# Patient Record
Sex: Male | Born: 1945 | Race: White | Hispanic: No | Smoking: Former smoker
Health system: Southern US, Community
[De-identification: ages and names within clinical notes are randomized; demographics above are authoritative.]

## PROBLEM LIST (undated history)

## (undated) DIAGNOSIS — I2510 Atherosclerotic heart disease of native coronary artery without angina pectoris: Secondary | ICD-10-CM

## (undated) DIAGNOSIS — I639 Cerebral infarction, unspecified: Secondary | ICD-10-CM

## (undated) DIAGNOSIS — E785 Hyperlipidemia, unspecified: Secondary | ICD-10-CM

## (undated) DIAGNOSIS — Z7901 Long term (current) use of anticoagulants: Secondary | ICD-10-CM

## (undated) DIAGNOSIS — I480 Paroxysmal atrial fibrillation: Secondary | ICD-10-CM

## (undated) DIAGNOSIS — K219 Gastro-esophageal reflux disease without esophagitis: Secondary | ICD-10-CM

## (undated) DIAGNOSIS — Z9889 Other specified postprocedural states: Secondary | ICD-10-CM

## (undated) DIAGNOSIS — Z9189 Other specified personal risk factors, not elsewhere classified: Secondary | ICD-10-CM

## (undated) DIAGNOSIS — I10 Essential (primary) hypertension: Secondary | ICD-10-CM

## (undated) DIAGNOSIS — Z952 Presence of prosthetic heart valve: Secondary | ICD-10-CM

## (undated) DIAGNOSIS — E079 Disorder of thyroid, unspecified: Secondary | ICD-10-CM

## (undated) DIAGNOSIS — Z951 Presence of aortocoronary bypass graft: Secondary | ICD-10-CM

## (undated) DIAGNOSIS — H269 Unspecified cataract: Secondary | ICD-10-CM

## (undated) DIAGNOSIS — N189 Chronic kidney disease, unspecified: Secondary | ICD-10-CM

## (undated) HISTORY — DX: Hyperlipidemia, unspecified: E78.5

## (undated) HISTORY — DX: Unspecified cataract: H26.9

## (undated) HISTORY — DX: Gastro-esophageal reflux disease without esophagitis: K21.9

## (undated) HISTORY — DX: Presence of aortocoronary bypass graft: Z95.1

## (undated) HISTORY — DX: Chronic kidney disease, unspecified: N18.9

## (undated) HISTORY — DX: Atherosclerotic heart disease of native coronary artery without angina pectoris: I25.10

---

## 2001-08-15 DIAGNOSIS — I639 Cerebral infarction, unspecified: Secondary | ICD-10-CM

## 2001-08-15 HISTORY — DX: Cerebral infarction, unspecified: I63.9

## 2001-09-10 ENCOUNTER — Inpatient Hospital Stay (HOSPITAL_COMMUNITY): Admission: EM | Admit: 2001-09-10 | Discharge: 2001-09-13 | Payer: Self-pay | Admitting: Emergency Medicine

## 2001-09-10 ENCOUNTER — Encounter: Payer: Self-pay | Admitting: Neurology

## 2001-09-10 ENCOUNTER — Encounter: Payer: Self-pay | Admitting: Emergency Medicine

## 2001-09-13 ENCOUNTER — Inpatient Hospital Stay (HOSPITAL_COMMUNITY)
Admission: RE | Admit: 2001-09-13 | Discharge: 2001-10-09 | Payer: Self-pay | Admitting: Physical Medicine & Rehabilitation

## 2001-10-23 ENCOUNTER — Encounter (HOSPITAL_COMMUNITY): Admission: RE | Admit: 2001-10-23 | Discharge: 2001-11-22 | Payer: Self-pay | Admitting: Family Medicine

## 2001-11-23 ENCOUNTER — Encounter (HOSPITAL_COMMUNITY)
Admission: RE | Admit: 2001-11-23 | Discharge: 2001-12-23 | Payer: Self-pay | Admitting: Physical Medicine & Rehabilitation

## 2001-12-24 ENCOUNTER — Encounter (HOSPITAL_COMMUNITY)
Admission: RE | Admit: 2001-12-24 | Discharge: 2002-01-23 | Payer: Self-pay | Admitting: Physical Medicine & Rehabilitation

## 2002-01-25 ENCOUNTER — Encounter (HOSPITAL_COMMUNITY)
Admission: RE | Admit: 2002-01-25 | Discharge: 2002-02-24 | Payer: Self-pay | Admitting: Physical Medicine & Rehabilitation

## 2002-02-25 ENCOUNTER — Encounter (HOSPITAL_COMMUNITY)
Admission: RE | Admit: 2002-02-25 | Discharge: 2002-03-27 | Payer: Self-pay | Admitting: Physical Medicine & Rehabilitation

## 2002-03-28 ENCOUNTER — Other Ambulatory Visit: Admission: RE | Admit: 2002-03-28 | Discharge: 2002-03-28 | Payer: Self-pay | Admitting: General Surgery

## 2004-03-11 ENCOUNTER — Observation Stay (HOSPITAL_COMMUNITY): Admission: RE | Admit: 2004-03-11 | Discharge: 2004-03-12 | Payer: Self-pay | Admitting: Urology

## 2005-04-28 ENCOUNTER — Emergency Department (HOSPITAL_COMMUNITY): Admission: EM | Admit: 2005-04-28 | Discharge: 2005-04-28 | Payer: Self-pay | Admitting: Emergency Medicine

## 2005-08-15 HISTORY — PX: COLONOSCOPY: SHX174

## 2006-05-04 ENCOUNTER — Ambulatory Visit (HOSPITAL_COMMUNITY): Admission: RE | Admit: 2006-05-04 | Discharge: 2006-05-04 | Payer: Self-pay | Admitting: General Surgery

## 2008-08-15 DIAGNOSIS — Z951 Presence of aortocoronary bypass graft: Secondary | ICD-10-CM

## 2008-08-15 DIAGNOSIS — Z952 Presence of prosthetic heart valve: Secondary | ICD-10-CM

## 2008-08-15 HISTORY — DX: Presence of prosthetic heart valve: Z95.2

## 2008-08-15 HISTORY — DX: Presence of aortocoronary bypass graft: Z95.1

## 2008-08-20 ENCOUNTER — Ambulatory Visit (HOSPITAL_COMMUNITY): Admission: RE | Admit: 2008-08-20 | Discharge: 2008-08-20 | Payer: Self-pay | Admitting: Family Medicine

## 2009-01-05 ENCOUNTER — Ambulatory Visit (HOSPITAL_COMMUNITY): Admission: RE | Admit: 2009-01-05 | Discharge: 2009-01-05 | Payer: Self-pay | Admitting: Cardiovascular Disease

## 2009-01-07 ENCOUNTER — Ambulatory Visit (HOSPITAL_COMMUNITY): Admission: RE | Admit: 2009-01-07 | Discharge: 2009-01-07 | Payer: Self-pay | Admitting: Cardiovascular Disease

## 2009-01-26 HISTORY — PX: AORTIC VALVE REPLACEMENT: SHX41

## 2009-01-26 HISTORY — PX: CORONARY ARTERY BYPASS GRAFT: SHX141

## 2009-01-29 ENCOUNTER — Encounter: Payer: Self-pay | Admitting: Cardiovascular Disease

## 2009-01-29 ENCOUNTER — Ambulatory Visit (HOSPITAL_COMMUNITY): Admission: RE | Admit: 2009-01-29 | Discharge: 2009-01-29 | Payer: Self-pay | Admitting: Cardiovascular Disease

## 2009-02-09 ENCOUNTER — Inpatient Hospital Stay (HOSPITAL_COMMUNITY): Admission: EM | Admit: 2009-02-09 | Discharge: 2009-02-17 | Payer: Self-pay | Admitting: Emergency Medicine

## 2009-02-10 ENCOUNTER — Encounter: Payer: Self-pay | Admitting: Cardiothoracic Surgery

## 2009-02-10 ENCOUNTER — Ambulatory Visit: Payer: Self-pay | Admitting: Cardiothoracic Surgery

## 2009-02-11 ENCOUNTER — Encounter: Payer: Self-pay | Admitting: Thoracic Surgery (Cardiothoracic Vascular Surgery)

## 2009-02-11 ENCOUNTER — Encounter: Payer: Self-pay | Admitting: Cardiothoracic Surgery

## 2009-02-21 ENCOUNTER — Inpatient Hospital Stay (HOSPITAL_COMMUNITY): Admission: EM | Admit: 2009-02-21 | Discharge: 2009-02-26 | Payer: Self-pay | Admitting: Emergency Medicine

## 2009-03-04 ENCOUNTER — Encounter: Admission: RE | Admit: 2009-03-04 | Discharge: 2009-03-04 | Payer: Self-pay | Admitting: Cardiothoracic Surgery

## 2009-03-04 ENCOUNTER — Ambulatory Visit: Payer: Self-pay | Admitting: Cardiothoracic Surgery

## 2009-03-16 ENCOUNTER — Ambulatory Visit: Payer: Self-pay | Admitting: Cardiothoracic Surgery

## 2009-04-02 ENCOUNTER — Ambulatory Visit (HOSPITAL_COMMUNITY): Admission: RE | Admit: 2009-04-02 | Discharge: 2009-04-02 | Payer: Self-pay | Admitting: Family Medicine

## 2009-04-03 ENCOUNTER — Ambulatory Visit (HOSPITAL_COMMUNITY): Admission: RE | Admit: 2009-04-03 | Discharge: 2009-04-03 | Payer: Self-pay | Admitting: Family Medicine

## 2009-04-08 ENCOUNTER — Ambulatory Visit (HOSPITAL_COMMUNITY): Admission: RE | Admit: 2009-04-08 | Discharge: 2009-04-08 | Payer: Self-pay | Admitting: Family Medicine

## 2009-06-05 ENCOUNTER — Encounter (HOSPITAL_COMMUNITY): Admission: RE | Admit: 2009-06-05 | Discharge: 2009-08-14 | Payer: Self-pay | Admitting: Endocrinology

## 2009-06-15 ENCOUNTER — Ambulatory Visit (HOSPITAL_COMMUNITY): Admission: RE | Admit: 2009-06-15 | Discharge: 2009-06-15 | Payer: Self-pay | Admitting: Internal Medicine

## 2009-07-07 ENCOUNTER — Encounter: Admission: RE | Admit: 2009-07-07 | Discharge: 2009-07-07 | Payer: Self-pay | Admitting: Endocrinology

## 2009-07-07 ENCOUNTER — Other Ambulatory Visit: Admission: RE | Admit: 2009-07-07 | Discharge: 2009-07-07 | Payer: Self-pay | Admitting: Interventional Radiology

## 2009-08-16 ENCOUNTER — Emergency Department (HOSPITAL_COMMUNITY): Admission: EM | Admit: 2009-08-16 | Discharge: 2009-08-16 | Payer: Self-pay | Admitting: Emergency Medicine

## 2009-11-02 ENCOUNTER — Encounter: Payer: Self-pay | Admitting: Orthopedic Surgery

## 2009-11-05 ENCOUNTER — Ambulatory Visit: Payer: Self-pay | Admitting: Orthopedic Surgery

## 2009-11-05 DIAGNOSIS — IMO0002 Reserved for concepts with insufficient information to code with codable children: Secondary | ICD-10-CM | POA: Insufficient documentation

## 2009-11-05 DIAGNOSIS — M75100 Unspecified rotator cuff tear or rupture of unspecified shoulder, not specified as traumatic: Secondary | ICD-10-CM | POA: Insufficient documentation

## 2010-01-23 ENCOUNTER — Encounter (INDEPENDENT_AMBULATORY_CARE_PROVIDER_SITE_OTHER): Payer: Self-pay | Admitting: *Deleted

## 2010-02-17 ENCOUNTER — Encounter: Admission: RE | Admit: 2010-02-17 | Discharge: 2010-02-17 | Payer: Self-pay | Admitting: Endocrinology

## 2010-05-04 ENCOUNTER — Encounter (INDEPENDENT_AMBULATORY_CARE_PROVIDER_SITE_OTHER): Payer: Self-pay | Admitting: Cardiology

## 2010-05-04 ENCOUNTER — Ambulatory Visit (HOSPITAL_COMMUNITY): Admission: RE | Admit: 2010-05-04 | Discharge: 2010-05-04 | Payer: Self-pay | Admitting: Psychiatry

## 2010-09-16 NOTE — Letter (Signed)
Summary: History form  History form   Imported By: Jacklynn Ganong 11/09/2009 08:09:11  _____________________________________________________________________  External Attachment:    Type:   Image     Comment:   External Document

## 2010-09-16 NOTE — Letter (Signed)
Summary: *Orthopedic No Show Letter  [facility]  [address] [place] [address]  [place], [place] [address]   Phone: [phone]  Fax: [phone]      01/22/2010   [name] [address] [place], [place]  [address]     Dear [name],   Our records indicate that you missed your scheduled appointment with Dr. Beaulah Corin on 01/18/10.  Please contact this office to reschedule your appointment as soon as possible.  It is important that you keep your scheduled appointments with your physician, so we can provide you the best care possible.        Sincerely,    Dr. Terrance Mass, MD [facility] Phone [phone]

## 2010-09-16 NOTE — Assessment & Plan Note (Signed)
Summary: LEFT SHOULDER PAIN NEEDS XR/SEC HOR/CRESENZO/BSF   Vital Signs:  Patient profile:   65 year old male Height:      70 inches Weight:      228 pounds Pulse rate:   76 / minute Resp:     18 per minute  Vitals Entered By: Fuller Canada MD (November 05, 2009 9:40 AM)  Visit Type:  initial visit Referring Provider:  Dr. Nobie Putnam Primary Provider:  [facility]  CC:  left shoulder pain.  History of Present Illness: 65 year old male had a stroke in 2003 it affected his RIGHT side his LEFT side is normal he started having atraumatic onset of pain a month ago it got worse and worse.  It is an 8/10 on really by Vicodin.  His sharp dull pain worse when he moves his shoulder associated with catching.  Is worse when he tries to lift his arm over his head.  Xrays today in our office.  Meds: Gabapentin, Pantoprazole, Simvastatin, Stool softener, Vicodin 5, Atlantis, Carvedilil, Glipizide, Xanax, Pennsaid, Torsemide, Lisinopril, ASA, Insulin.     Allergies (verified): No Known Drug Allergies  Past History:  Past Medical History: hx of stroke htn reflux cholesterol diabetes anxiety depression  Past Surgical History: open heart 3 bypass  Family History: FH of Cancer:  Family History of Diabetes Family History Coronary Heart Disease male < 58 Family History of Arthritis Hx, family, chronic respiratory condition  Social History: Patient is single.  disabled no smoking no alcohol alot of caffeine use daily  Review of Systems Constitutional:  Complains of fatigue; denies weight loss, weight gain, fever, and chills. Cardiovascular:  Complains of chest pain, palpitations, and murmurs; denies fainting. Respiratory:  Complains of short of breath, wheezing, couch, and snoring; denies tightness, pain on inspiration, and snoring . Gastrointestinal:  Complains of heartburn; denies nausea, vomiting, diarrhea, constipation, and blood in your stools. Genitourinary:   Complains of frequency; denies urgency, difficulty urinating, painful urination, flank pain, and bleeding in urine. Neurologic:  Complains of tingling and dizziness; denies numbness, unsteady gait, tremors, and seizure. Musculoskeletal:  Complains of joint pain, swelling, stiffness, and muscle pain; denies instability, redness, and heat. Endocrine:  Denies excessive thirst, exessive urination, and heat or cold intolerance. Psychiatric:  Complains of nervousness, depression, anxiety, and hallucinations. Skin:  Complains of poor healing; denies changes in the skin, rash, itching, and redness. HEENT:  Complains of blurred or double vision and watering; denies eye pain and redness; headache, difficult swallowing, ears ring. Immunology:  Denies seasonal allergies, sinus problems, and allergic to bee stings. Hemoatologic:  Complains of easy bleeding and brusing.  Physical Exam  Additional Exam:  GEN: well developed, well nourished, normal grooming and hygiene, no deformity and body habitus tall medium build  CDV: pulses are normal, no edema, no erythema. no tenderness  Lymph: normal lymph nodes   Skin: no rashes, skin lesions or open sores   NEURO: normal sensation LEFT upper extremity  Psyche: awake, alert and oriented. Mood normal   Gait: favors RIGHT side  No deformity noted LEFT shoulder tenderness anterolateral deltoid restricted range of motion with 50 external rotation, forward elevation only 90, abduction 90, weakness in the supraspinatus.  Shoulder is stable.      Impression & Recommendations:  Problem # 1:  SUBACROMIAL BURSITIS, LEFT (ICD-726.19) Assessment New  x-rays LEFT shoulder  Minimal joint is normal, acromion is curved.  Otherwise normal  Impression normal LEFT shoulder  Orders: New Patient Level III ([address]) Joint Aspirate / Injection,  Large (20610) Depo- Medrol 40mg  (J1030) Shoulder x-ray,  minimum 2 views ([address])  Patient Instructions: 1)  You have  received an injection of cortisone today. You may experience increased pain at the injection site. Apply ice pack to the area for 20 minutes every 2 hours and take 2 xtra strength tylenol every 8 hours. This increased pain will usually resolve in 24 hours. The injection will take effect in 3-10 days.  2)  come back in 4 weeks

## 2010-09-16 NOTE — Letter (Signed)
Summary: Pericardial tissue heart valve implant card  Pericardial tissue heart valve implant card   Imported By: Jacklynn Ganong 11/13/2009 08:22:49  _____________________________________________________________________  External Attachment:    Type:   Image     Comment:   External Document

## 2010-09-23 IMAGING — CR DG CHEST 2V
2 series · 2 of 2 positions shown · non-contrast
Comparison: Read healed diagnostic chest x-ray 01/05/2009 and Deeqa Rayaan[MEHUL]portable chest x-ray 02/09/2009.

CLINICAL DATA: Preop for CABG, unstable angina, hypertension,
diabetes, former smoker.

CHEST - 2 VIEW

[w chest pa]
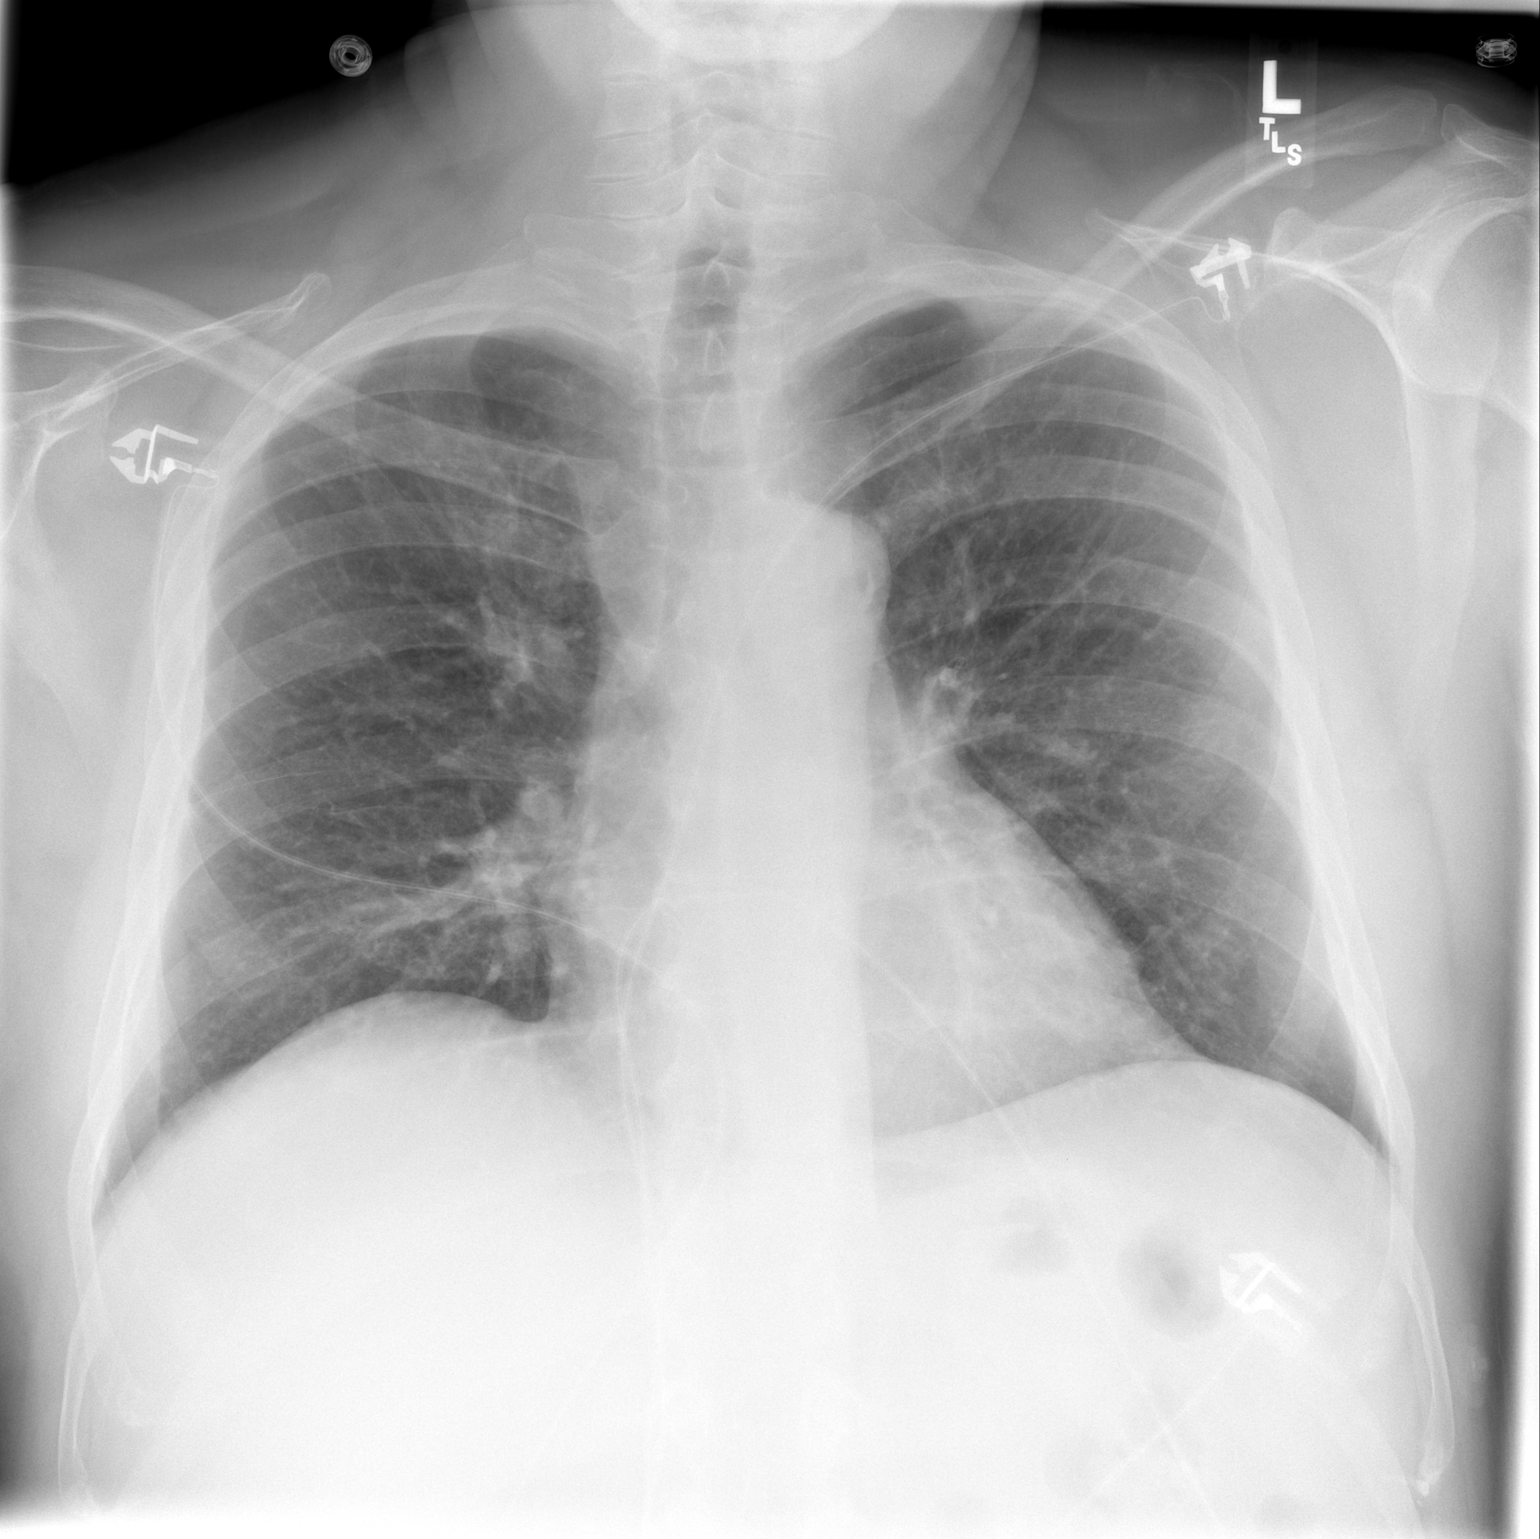

[w chest lat *]
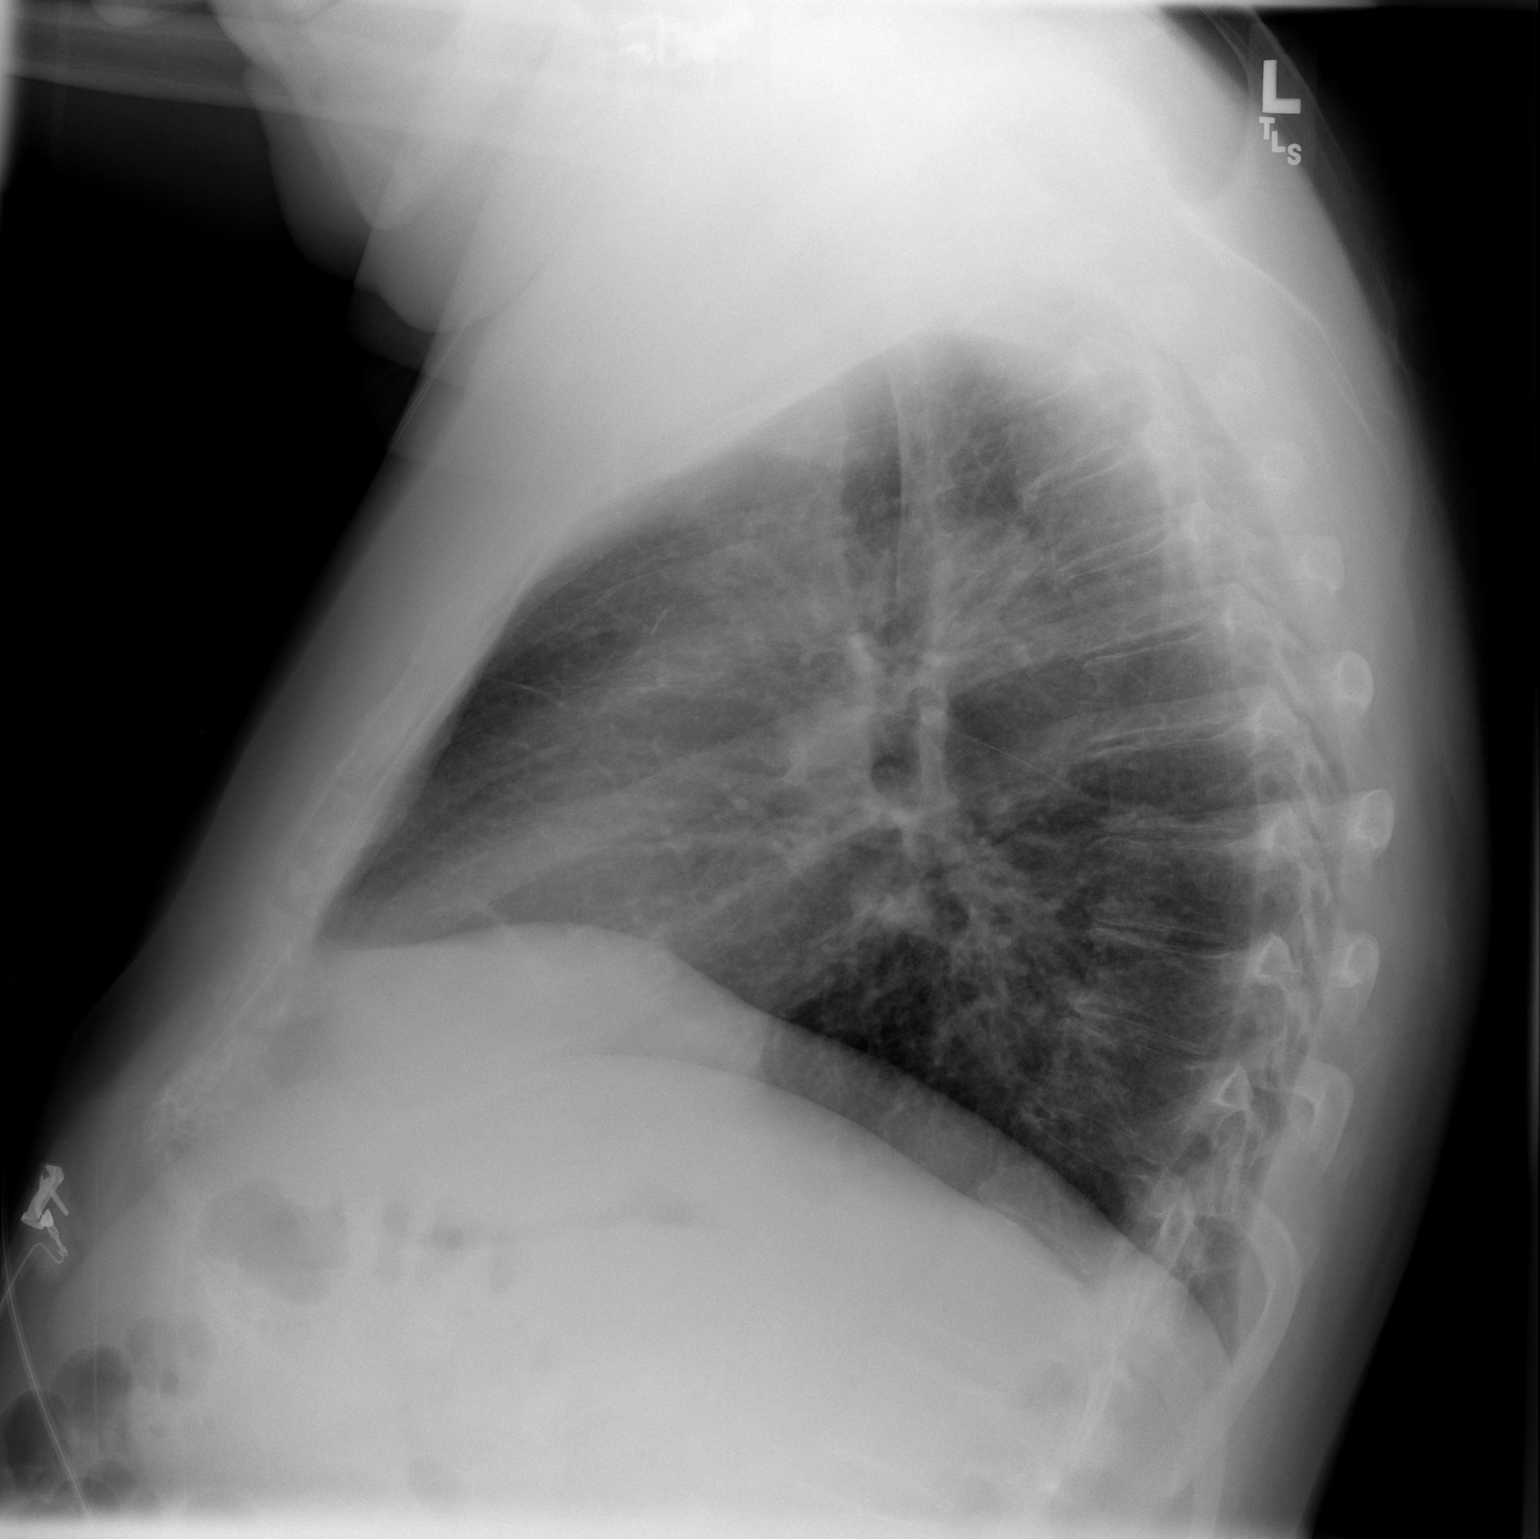

[2 of 2 positions shown; findings below may reference images not displayed]

FINDINGS: Submaximal inspiration is seen with clear lungs.
Pulmonary vascularity currently appears normal.  Heart size and
configuration are normal.  Thoracic aortic arch is slightly
calcified.  Mediastinum, hila, pleura and osseous structures appear
normal for age.
IMPRESSION: 1.  Submaximal inspiration.
2.  No active cardiopulmonary disease.

## 2010-10-31 LAB — DIFFERENTIAL
Eosinophils Absolute: 0.1 10*3/uL (ref 0.0–0.7)
Eosinophils Relative: 1 % (ref 0–5)
Lymphocytes Relative: 16 % (ref 12–46)
Lymphs Abs: 1.9 10*3/uL (ref 0.7–4.0)
Monocytes Absolute: 0.9 10*3/uL (ref 0.1–1.0)
Monocytes Relative: 8 % (ref 3–12)

## 2010-10-31 LAB — CBC
HCT: 43.8 % (ref 39.0–52.0)
Hemoglobin: 15 g/dL (ref 13.0–17.0)
MCV: 82.4 fL (ref 78.0–100.0)
WBC: 11.3 10*3/uL — ABNORMAL HIGH (ref 4.0–10.5)

## 2010-10-31 LAB — BASIC METABOLIC PANEL
BUN: 27 mg/dL — ABNORMAL HIGH (ref 6–23)
Chloride: 97 mEq/L (ref 96–112)
GFR calc non Af Amer: 54 mL/min — ABNORMAL LOW (ref >60)
Glucose, Bld: 357 mg/dL — ABNORMAL HIGH (ref 70–99)
Potassium: 4.5 mEq/L (ref 3.5–5.1)
Sodium: 134 mEq/L — ABNORMAL LOW (ref 135–145)

## 2010-10-31 LAB — POCT CARDIAC MARKERS
CKMB, poc: 1.6 ng/mL (ref 1.0–8.0)
Troponin i, poc: <0.05 ng/mL (ref 0.00–0.09)

## 2010-10-31 LAB — GLUCOSE, CAPILLARY: Glucose-Capillary: 310 mg/dL — ABNORMAL HIGH (ref 70–99)

## 2010-11-21 LAB — CBC
HCT: 23.3 % — ABNORMAL LOW (ref 39.0–52.0)
HCT: 24 % — ABNORMAL LOW (ref 39.0–52.0)
HCT: 25.2 % — ABNORMAL LOW (ref 39.0–52.0)
HCT: 25.9 % — ABNORMAL LOW (ref 39.0–52.0)
HCT: 26.5 % — ABNORMAL LOW (ref 39.0–52.0)
HCT: 26.5 % — ABNORMAL LOW (ref 39.0–52.0)
HCT: 29.6 % — ABNORMAL LOW (ref 39.0–52.0)
HCT: 30.6 % — ABNORMAL LOW (ref 39.0–52.0)
HCT: 31.7 % — ABNORMAL LOW (ref 39.0–52.0)
Hemoglobin: 10.7 g/dL — ABNORMAL LOW (ref 13.0–17.0)
Hemoglobin: 7.8 g/dL — CL (ref 13.0–17.0)
Hemoglobin: 7.9 g/dL — CL (ref 13.0–17.0)
Hemoglobin: 8.4 g/dL — ABNORMAL LOW (ref 13.0–17.0)
Hemoglobin: 8.4 g/dL — ABNORMAL LOW (ref 13.0–17.0)
Hemoglobin: 8.8 g/dL — ABNORMAL LOW (ref 13.0–17.0)
Hemoglobin: 8.9 g/dL — ABNORMAL LOW (ref 13.0–17.0)
Hemoglobin: 8.9 g/dL — ABNORMAL LOW (ref 13.0–17.0)
Hemoglobin: 9 g/dL — ABNORMAL LOW (ref 13.0–17.0)
Hemoglobin: 9.5 g/dL — ABNORMAL LOW (ref 13.0–17.0)
Hemoglobin: 9.8 g/dL — ABNORMAL LOW (ref 13.0–17.0)
MCHC: 32.4 g/dL (ref 30.0–36.0)
MCHC: 32.9 g/dL (ref 30.0–36.0)
MCHC: 33.2 g/dL (ref 30.0–36.0)
MCHC: 33.3 g/dL (ref 30.0–36.0)
MCHC: 33.3 g/dL (ref 30.0–36.0)
MCHC: 33.7 g/dL (ref 30.0–36.0)
MCHC: 33.8 g/dL (ref 30.0–36.0)
MCHC: 33.9 g/dL (ref 30.0–36.0)
MCV: 76.1 fL — ABNORMAL LOW (ref 78.0–100.0)
MCV: 76.2 fL — ABNORMAL LOW (ref 78.0–100.0)
MCV: 78 fL (ref 78.0–100.0)
MCV: 78.8 fL (ref 78.0–100.0)
MCV: 78.9 fL (ref 78.0–100.0)
MCV: 79.6 fL (ref 78.0–100.0)
Platelets: 112 10*3/uL — ABNORMAL LOW (ref 150–400)
Platelets: 116 10*3/uL — ABNORMAL LOW (ref 150–400)
Platelets: 134 10*3/uL — ABNORMAL LOW (ref 150–400)
Platelets: 150 10*3/uL (ref 150–400)
Platelets: 179 10*3/uL (ref 150–400)
Platelets: 187 10*3/uL (ref 150–400)
Platelets: 272 10*3/uL (ref 150–400)
Platelets: 354 10*3/uL (ref 150–400)
RBC: 2.99 MIL/uL — ABNORMAL LOW (ref 4.22–5.81)
RBC: 3.15 MIL/uL — ABNORMAL LOW (ref 4.22–5.81)
RBC: 3.19 MIL/uL — ABNORMAL LOW (ref 4.22–5.81)
RBC: 3.32 MIL/uL — ABNORMAL LOW (ref 4.22–5.81)
RBC: 3.33 MIL/uL — ABNORMAL LOW (ref 4.22–5.81)
RBC: 3.36 MIL/uL — ABNORMAL LOW (ref 4.22–5.81)
RBC: 3.39 MIL/uL — ABNORMAL LOW (ref 4.22–5.81)
RBC: 3.4 MIL/uL — ABNORMAL LOW (ref 4.22–5.81)
RBC: 3.6 MIL/uL — ABNORMAL LOW (ref 4.22–5.81)
RBC: 3.73 MIL/uL — ABNORMAL LOW (ref 4.22–5.81)
RBC: 3.82 MIL/uL — ABNORMAL LOW (ref 4.22–5.81)
RBC: 4.03 MIL/uL — ABNORMAL LOW (ref 4.22–5.81)
RDW: 18.4 % — ABNORMAL HIGH (ref 11.5–15.5)
RDW: 18.5 % — ABNORMAL HIGH (ref 11.5–15.5)
RDW: 18.9 % — ABNORMAL HIGH (ref 11.5–15.5)
RDW: 19 % — ABNORMAL HIGH (ref 11.5–15.5)
RDW: 19.7 % — ABNORMAL HIGH (ref 11.5–15.5)
RDW: 20 % — ABNORMAL HIGH (ref 11.5–15.5)
RDW: 20.6 % — ABNORMAL HIGH (ref 11.5–15.5)
RDW: 20.8 % — ABNORMAL HIGH (ref 11.5–15.5)
RDW: 21.4 % — ABNORMAL HIGH (ref 11.5–15.5)
WBC: 10.4 10*3/uL (ref 4.0–10.5)
WBC: 10.7 10*3/uL — ABNORMAL HIGH (ref 4.0–10.5)
WBC: 11.5 10*3/uL — ABNORMAL HIGH (ref 4.0–10.5)
WBC: 11.9 10*3/uL — ABNORMAL HIGH (ref 4.0–10.5)
WBC: 11.9 10*3/uL — ABNORMAL HIGH (ref 4.0–10.5)
WBC: 12 10*3/uL — ABNORMAL HIGH (ref 4.0–10.5)
WBC: 13.9 10*3/uL — ABNORMAL HIGH (ref 4.0–10.5)
WBC: 15.9 10*3/uL — ABNORMAL HIGH (ref 4.0–10.5)
WBC: 8.5 10*3/uL (ref 4.0–10.5)
WBC: 9.1 10*3/uL (ref 4.0–10.5)
WBC: 9.6 10*3/uL (ref 4.0–10.5)

## 2010-11-21 LAB — BASIC METABOLIC PANEL
BUN: 14 mg/dL (ref 6–23)
BUN: 16 mg/dL (ref 6–23)
BUN: 25 mg/dL — ABNORMAL HIGH (ref 6–23)
BUN: 29 mg/dL — ABNORMAL HIGH (ref 6–23)
BUN: 32 mg/dL — ABNORMAL HIGH (ref 6–23)
BUN: 9 mg/dL (ref 6–23)
CO2: 24 mEq/L (ref 19–32)
CO2: 25 mEq/L (ref 19–32)
CO2: 27 mEq/L (ref 19–32)
CO2: 28 mEq/L (ref 19–32)
CO2: 28 mEq/L (ref 19–32)
CO2: 30 mEq/L (ref 19–32)
Calcium: 8.1 mg/dL — ABNORMAL LOW (ref 8.4–10.5)
Calcium: 8.2 mg/dL — ABNORMAL LOW (ref 8.4–10.5)
Calcium: 8.2 mg/dL — ABNORMAL LOW (ref 8.4–10.5)
Calcium: 8.5 mg/dL (ref 8.4–10.5)
Calcium: 8.6 mg/dL (ref 8.4–10.5)
Calcium: 8.7 mg/dL (ref 8.4–10.5)
Calcium: 9.4 mg/dL (ref 8.4–10.5)
Chloride: 100 mEq/L (ref 96–112)
Chloride: 101 mEq/L (ref 96–112)
Chloride: 103 mEq/L (ref 96–112)
Chloride: 106 mEq/L (ref 96–112)
Chloride: 98 mEq/L (ref 96–112)
Creatinine, Ser: 1.05 mg/dL (ref 0.4–1.5)
Creatinine, Ser: 1.09 mg/dL (ref 0.4–1.5)
Creatinine, Ser: 1.18 mg/dL (ref 0.4–1.5)
Creatinine, Ser: 1.24 mg/dL (ref 0.4–1.5)
Creatinine, Ser: 1.28 mg/dL (ref 0.4–1.5)
GFR calc Af Amer: >60 mL/min (ref >60)
GFR calc Af Amer: >60 mL/min (ref >60)
GFR calc Af Amer: >60 mL/min (ref >60)
GFR calc Af Amer: >60 mL/min (ref >60)
GFR calc Af Amer: >60 mL/min (ref >60)
GFR calc Af Amer: >60 mL/min (ref >60)
GFR calc Af Amer: >60 mL/min (ref >60)
GFR calc non Af Amer: 57 mL/min — ABNORMAL LOW (ref >60)
GFR calc non Af Amer: 59 mL/min — ABNORMAL LOW (ref >60)
GFR calc non Af Amer: >60 mL/min (ref >60)
GFR calc non Af Amer: >60 mL/min (ref >60)
GFR calc non Af Amer: >60 mL/min (ref >60)
GFR calc non Af Amer: >60 mL/min (ref >60)
GFR calc non Af Amer: >60 mL/min (ref >60)
Glucose, Bld: 118 mg/dL — ABNORMAL HIGH (ref 70–99)
Glucose, Bld: 171 mg/dL — ABNORMAL HIGH (ref 70–99)
Glucose, Bld: 187 mg/dL — ABNORMAL HIGH (ref 70–99)
Glucose, Bld: 242 mg/dL — ABNORMAL HIGH (ref 70–99)
Glucose, Bld: 246 mg/dL — ABNORMAL HIGH (ref 70–99)
Potassium: 4 mEq/L (ref 3.5–5.1)
Potassium: 4 mEq/L (ref 3.5–5.1)
Potassium: 4.2 mEq/L (ref 3.5–5.1)
Potassium: 4.4 mEq/L (ref 3.5–5.1)
Potassium: 4.5 mEq/L (ref 3.5–5.1)
Potassium: 4.7 mEq/L (ref 3.5–5.1)
Potassium: 4.8 mEq/L (ref 3.5–5.1)
Sodium: 132 mEq/L — ABNORMAL LOW (ref 135–145)
Sodium: 134 mEq/L — ABNORMAL LOW (ref 135–145)
Sodium: 135 mEq/L (ref 135–145)
Sodium: 136 mEq/L (ref 135–145)
Sodium: 136 mEq/L (ref 135–145)
Sodium: 138 mEq/L (ref 135–145)

## 2010-11-21 LAB — POCT I-STAT 3, ART BLOOD GAS (G3+)
Bicarbonate: 23.4 mEq/L (ref 20.0–24.0)
Bicarbonate: 25.2 mEq/L — ABNORMAL HIGH (ref 20.0–24.0)
Patient temperature: 38.5
TCO2: 25 mmol/L (ref 0–100)
TCO2: 27 mmol/L (ref 0–100)
pCO2 arterial: 47.7 mmHg — ABNORMAL HIGH (ref 35.0–45.0)
pCO2 arterial: 48.9 mmHg — ABNORMAL HIGH (ref 35.0–45.0)
pH, Arterial: 7.294 — ABNORMAL LOW (ref 7.350–7.450)
pH, Arterial: 7.336 — ABNORMAL LOW (ref 7.350–7.450)

## 2010-11-21 LAB — CROSSMATCH
ABO/RH(D): O POS
ABO/RH(D): O POS
Antibody Screen: NEGATIVE

## 2010-11-21 LAB — GLUCOSE, CAPILLARY
Glucose-Capillary: 111 mg/dL — ABNORMAL HIGH (ref 70–99)
Glucose-Capillary: 120 mg/dL — ABNORMAL HIGH (ref 70–99)
Glucose-Capillary: 122 mg/dL — ABNORMAL HIGH (ref 70–99)
Glucose-Capillary: 128 mg/dL — ABNORMAL HIGH (ref 70–99)
Glucose-Capillary: 137 mg/dL — ABNORMAL HIGH (ref 70–99)
Glucose-Capillary: 141 mg/dL — ABNORMAL HIGH (ref 70–99)
Glucose-Capillary: 141 mg/dL — ABNORMAL HIGH (ref 70–99)
Glucose-Capillary: 141 mg/dL — ABNORMAL HIGH (ref 70–99)
Glucose-Capillary: 164 mg/dL — ABNORMAL HIGH (ref 70–99)
Glucose-Capillary: 180 mg/dL — ABNORMAL HIGH (ref 70–99)
Glucose-Capillary: 188 mg/dL — ABNORMAL HIGH (ref 70–99)
Glucose-Capillary: 198 mg/dL — ABNORMAL HIGH (ref 70–99)
Glucose-Capillary: 205 mg/dL — ABNORMAL HIGH (ref 70–99)
Glucose-Capillary: 209 mg/dL — ABNORMAL HIGH (ref 70–99)
Glucose-Capillary: 219 mg/dL — ABNORMAL HIGH (ref 70–99)
Glucose-Capillary: 223 mg/dL — ABNORMAL HIGH (ref 70–99)
Glucose-Capillary: 226 mg/dL — ABNORMAL HIGH (ref 70–99)
Glucose-Capillary: 229 mg/dL — ABNORMAL HIGH (ref 70–99)
Glucose-Capillary: 239 mg/dL — ABNORMAL HIGH (ref 70–99)
Glucose-Capillary: 243 mg/dL — ABNORMAL HIGH (ref 70–99)
Glucose-Capillary: 248 mg/dL — ABNORMAL HIGH (ref 70–99)
Glucose-Capillary: 259 mg/dL — ABNORMAL HIGH (ref 70–99)
Glucose-Capillary: 273 mg/dL — ABNORMAL HIGH (ref 70–99)
Glucose-Capillary: 285 mg/dL — ABNORMAL HIGH (ref 70–99)
Glucose-Capillary: 287 mg/dL — ABNORMAL HIGH (ref 70–99)
Glucose-Capillary: 304 mg/dL — ABNORMAL HIGH (ref 70–99)
Glucose-Capillary: 345 mg/dL — ABNORMAL HIGH (ref 70–99)

## 2010-11-21 LAB — CREATININE, SERUM
Creatinine, Ser: 1.06 mg/dL (ref 0.4–1.5)
Creatinine, Ser: 1.33 mg/dL (ref 0.4–1.5)
GFR calc Af Amer: >60 mL/min (ref >60)
GFR calc Af Amer: >60 mL/min (ref >60)
GFR calc non Af Amer: 55 mL/min — ABNORMAL LOW (ref >60)
GFR calc non Af Amer: >60 mL/min (ref >60)

## 2010-11-21 LAB — DIFFERENTIAL
Basophils Absolute: 0 10*3/uL (ref 0.0–0.1)
Basophils Absolute: 0.1 10*3/uL (ref 0.0–0.1)
Basophils Relative: 0 % (ref 0–1)
Basophils Relative: 1 % (ref 0–1)
Basophils Relative: 1 % (ref 0–1)
Basophils Relative: 1 % (ref 0–1)
Eosinophils Absolute: 0.2 10*3/uL (ref 0.0–0.7)
Eosinophils Absolute: 0.3 10*3/uL (ref 0.0–0.7)
Eosinophils Relative: 2 % (ref 0–5)
Eosinophils Relative: 3 % (ref 0–5)
Lymphocytes Relative: 13 % (ref 12–46)
Lymphocytes Relative: 15 % (ref 12–46)
Lymphocytes Relative: 17 % (ref 12–46)
Lymphs Abs: 1.4 10*3/uL (ref 0.7–4.0)
Lymphs Abs: 1.5 10*3/uL (ref 0.7–4.0)
Monocytes Absolute: 0.9 10*3/uL (ref 0.1–1.0)
Monocytes Absolute: 1 10*3/uL (ref 0.1–1.0)
Monocytes Absolute: 1.4 10*3/uL — ABNORMAL HIGH (ref 0.1–1.0)
Monocytes Relative: 10 % (ref 3–12)
Monocytes Relative: 10 % (ref 3–12)
Monocytes Relative: 11 % (ref 3–12)
Monocytes Relative: 12 % (ref 3–12)
Monocytes Relative: 12 % (ref 3–12)
Neutro Abs: 6.3 10*3/uL (ref 1.7–7.7)
Neutro Abs: 6.9 10*3/uL (ref 1.7–7.7)
Neutro Abs: 7 10*3/uL (ref 1.7–7.7)
Neutro Abs: 9.2 10*3/uL — ABNORMAL HIGH (ref 1.7–7.7)
Neutrophils Relative %: 69 % (ref 43–77)
Neutrophils Relative %: 70 % (ref 43–77)
Neutrophils Relative %: 71 % (ref 43–77)

## 2010-11-21 LAB — PROTIME-INR: INR: 1.2 (ref 0.00–1.49)

## 2010-11-21 LAB — POCT I-STAT, CHEM 8
BUN: 10 mg/dL (ref 6–23)
BUN: 14 mg/dL (ref 6–23)
Calcium, Ion: 1.18 mmol/L (ref 1.12–1.32)
Calcium, Ion: 1.21 mmol/L (ref 1.12–1.32)
Creatinine, Ser: 1.1 mg/dL (ref 0.4–1.5)
Glucose, Bld: 127 mg/dL — ABNORMAL HIGH (ref 70–99)
Hemoglobin: 8.5 g/dL — ABNORMAL LOW (ref 13.0–17.0)
TCO2: 23 mmol/L (ref 0–100)
TCO2: 23 mmol/L (ref 0–100)

## 2010-11-21 LAB — IRON AND TIBC
Iron: 12 ug/dL — ABNORMAL LOW (ref 42–135)
TIBC: 304 ug/dL (ref 215–435)

## 2010-11-21 LAB — VANCOMYCIN, TROUGH: Vancomycin Tr: 13.5 ug/mL (ref 10.0–20.0)

## 2010-11-21 LAB — COMPREHENSIVE METABOLIC PANEL
ALT: 28 U/L (ref 0–53)
ALT: 33 U/L (ref 0–53)
Alkaline Phosphatase: 73 U/L (ref 39–117)
Alkaline Phosphatase: 79 U/L (ref 39–117)
CO2: 31 mEq/L (ref 19–32)
CO2: 32 mEq/L (ref 19–32)
Chloride: 101 mEq/L (ref 96–112)
Chloride: 103 mEq/L (ref 96–112)
GFR calc non Af Amer: >60 mL/min (ref >60)
Glucose, Bld: 113 mg/dL — ABNORMAL HIGH (ref 70–99)
Glucose, Bld: 121 mg/dL — ABNORMAL HIGH (ref 70–99)
Potassium: 4.1 mEq/L (ref 3.5–5.1)
Potassium: 4.3 mEq/L (ref 3.5–5.1)
Sodium: 138 mEq/L (ref 135–145)
Sodium: 138 mEq/L (ref 135–145)
Total Protein: 6.6 g/dL (ref 6.0–8.3)

## 2010-11-21 LAB — MAGNESIUM
Magnesium: 2.4 mg/dL (ref 1.5–2.5)
Magnesium: 2.6 mg/dL — ABNORMAL HIGH (ref 1.5–2.5)
Magnesium: 2.8 mg/dL — ABNORMAL HIGH (ref 1.5–2.5)

## 2010-11-21 LAB — BRAIN NATRIURETIC PEPTIDE
Pro B Natriuretic peptide (BNP): 172 pg/mL — ABNORMAL HIGH (ref 0.0–100.0)
Pro B Natriuretic peptide (BNP): 246 pg/mL — ABNORMAL HIGH (ref 0.0–100.0)
Pro B Natriuretic peptide (BNP): 335 pg/mL — ABNORMAL HIGH (ref 0.0–100.0)

## 2010-11-21 LAB — FOLATE: Folate: 16.1 ng/mL

## 2010-11-21 LAB — CULTURE, BLOOD (ROUTINE X 2)
Culture: NO GROWTH
Culture: NO GROWTH
Report Status: 7152010
Report Status: 7162010

## 2010-11-21 LAB — POTASSIUM: Potassium: 5.3 mEq/L — ABNORMAL HIGH (ref 3.5–5.1)

## 2010-11-21 LAB — RETICULOCYTES: Retic Count, Absolute: 80.7 10*3/uL (ref 19.0–186.0)

## 2010-11-21 LAB — VITAMIN B12: Vitamin B-12: 546 pg/mL (ref 211–911)

## 2010-11-22 LAB — COMPREHENSIVE METABOLIC PANEL
Albumin: 3.7 g/dL (ref 3.5–5.2)
Alkaline Phosphatase: 78 U/L (ref 39–117)
BUN: 19 mg/dL (ref 6–23)
CO2: 26 mEq/L (ref 19–32)
Chloride: 98 mEq/L (ref 96–112)
Creatinine, Ser: 1.45 mg/dL (ref 0.4–1.5)
GFR calc non Af Amer: 50 mL/min — ABNORMAL LOW (ref >60)
Glucose, Bld: 363 mg/dL — ABNORMAL HIGH (ref 70–99)
Potassium: 4.8 mEq/L (ref 3.5–5.1)
Total Bilirubin: 0.3 mg/dL (ref 0.3–1.2)

## 2010-11-22 LAB — BASIC METABOLIC PANEL
CO2: 26 mEq/L (ref 19–32)
Calcium: 9.5 mg/dL (ref 8.4–10.5)
GFR calc Af Amer: >60 mL/min (ref >60)
GFR calc non Af Amer: >60 mL/min (ref >60)
Sodium: 137 mEq/L (ref 135–145)

## 2010-11-22 LAB — CROSSMATCH
ABO/RH(D): O POS
Antibody Screen: NEGATIVE

## 2010-11-22 LAB — POCT I-STAT 3, ART BLOOD GAS (G3+)
Bicarbonate: 23.2 mEq/L (ref 20.0–24.0)
TCO2: 24 mmol/L (ref 0–100)
pCO2 arterial: 49.1 mmHg — ABNORMAL HIGH (ref 35.0–45.0)
pH, Arterial: 7.337 — ABNORMAL LOW (ref 7.350–7.450)
pH, Arterial: 7.358 (ref 7.350–7.450)
pH, Arterial: 7.364 (ref 7.350–7.450)
pO2, Arterial: 133 mmHg — ABNORMAL HIGH (ref 80.0–100.0)
pO2, Arterial: 202 mmHg — ABNORMAL HIGH (ref 80.0–100.0)

## 2010-11-22 LAB — GLUCOSE, CAPILLARY
Glucose-Capillary: 109 mg/dL — ABNORMAL HIGH (ref 70–99)
Glucose-Capillary: 117 mg/dL — ABNORMAL HIGH (ref 70–99)
Glucose-Capillary: 121 mg/dL — ABNORMAL HIGH (ref 70–99)
Glucose-Capillary: 124 mg/dL — ABNORMAL HIGH (ref 70–99)
Glucose-Capillary: 312 mg/dL — ABNORMAL HIGH (ref 70–99)

## 2010-11-22 LAB — URINALYSIS, ROUTINE W REFLEX MICROSCOPIC
Bilirubin Urine: NEGATIVE
Glucose, UA: >1000 mg/dL — AB
Hgb urine dipstick: NEGATIVE
Ketones, ur: NEGATIVE mg/dL
Leukocytes, UA: NEGATIVE
Nitrite: NEGATIVE
Protein, ur: NEGATIVE mg/dL
Specific Gravity, Urine: 1.009 (ref 1.005–1.030)
Urobilinogen, UA: 0.2 mg/dL (ref 0.0–1.0)
pH: 7 (ref 5.0–8.0)

## 2010-11-22 LAB — POCT I-STAT 4, (NA,K, GLUC, HGB,HCT)
Glucose, Bld: 154 mg/dL — ABNORMAL HIGH (ref 70–99)
Glucose, Bld: 196 mg/dL — ABNORMAL HIGH (ref 70–99)
Glucose, Bld: 197 mg/dL — ABNORMAL HIGH (ref 70–99)
Glucose, Bld: 241 mg/dL — ABNORMAL HIGH (ref 70–99)
Glucose, Bld: 272 mg/dL — ABNORMAL HIGH (ref 70–99)
Glucose, Bld: 284 mg/dL — ABNORMAL HIGH (ref 70–99)
HCT: 21 % — ABNORMAL LOW (ref 39.0–52.0)
HCT: 22 % — ABNORMAL LOW (ref 39.0–52.0)
HCT: 24 % — ABNORMAL LOW (ref 39.0–52.0)
HCT: 26 % — ABNORMAL LOW (ref 39.0–52.0)
HCT: 29 % — ABNORMAL LOW (ref 39.0–52.0)
Hemoglobin: 7.1 g/dL — CL (ref 13.0–17.0)
Hemoglobin: 7.1 g/dL — CL (ref 13.0–17.0)
Hemoglobin: 7.5 g/dL — CL (ref 13.0–17.0)
Hemoglobin: 8.8 g/dL — ABNORMAL LOW (ref 13.0–17.0)
Hemoglobin: 9.9 g/dL — ABNORMAL LOW (ref 13.0–17.0)
Potassium: 3.9 mEq/L (ref 3.5–5.1)
Potassium: 4.5 mEq/L (ref 3.5–5.1)
Potassium: 4.6 mEq/L (ref 3.5–5.1)
Potassium: 5.2 mEq/L — ABNORMAL HIGH (ref 3.5–5.1)
Potassium: 5.3 mEq/L — ABNORMAL HIGH (ref 3.5–5.1)
Sodium: 132 mEq/L — ABNORMAL LOW (ref 135–145)
Sodium: 139 mEq/L (ref 135–145)

## 2010-11-22 LAB — CBC
HCT: 25.3 % — ABNORMAL LOW (ref 39.0–52.0)
HCT: 31.8 % — ABNORMAL LOW (ref 39.0–52.0)
HCT: 33.6 % — ABNORMAL LOW (ref 39.0–52.0)
Hemoglobin: 10.8 g/dL — ABNORMAL LOW (ref 13.0–17.0)
Hemoglobin: 11 g/dL — ABNORMAL LOW (ref 13.0–17.0)
Hemoglobin: 11.3 g/dL — ABNORMAL LOW (ref 13.0–17.0)
Hemoglobin: 8.6 g/dL — ABNORMAL LOW (ref 13.0–17.0)
MCHC: 33.7 g/dL (ref 30.0–36.0)
MCV: 71.1 fL — ABNORMAL LOW (ref 78.0–100.0)
MCV: 75.9 fL — ABNORMAL LOW (ref 78.0–100.0)
Platelets: 132 10*3/uL — ABNORMAL LOW (ref 150–400)
RBC: 3.34 MIL/uL — ABNORMAL LOW (ref 4.22–5.81)
RBC: 4.71 MIL/uL (ref 4.22–5.81)
RDW: 16 % — ABNORMAL HIGH (ref 11.5–15.5)
RDW: 16.1 % — ABNORMAL HIGH (ref 11.5–15.5)
RDW: 16.3 % — ABNORMAL HIGH (ref 11.5–15.5)
RDW: 18.4 % — ABNORMAL HIGH (ref 11.5–15.5)
WBC: 10.7 10*3/uL — ABNORMAL HIGH (ref 4.0–10.5)

## 2010-11-22 LAB — BRAIN NATRIURETIC PEPTIDE: Pro B Natriuretic peptide (BNP): 150 pg/mL — ABNORMAL HIGH (ref 0.0–100.0)

## 2010-11-22 LAB — DIFFERENTIAL
Basophils Absolute: 0 10*3/uL (ref 0.0–0.1)
Lymphocytes Relative: 19 % (ref 12–46)
Lymphs Abs: 2.3 10*3/uL (ref 0.7–4.0)
Neutro Abs: 8.2 10*3/uL — ABNORMAL HIGH (ref 1.7–7.7)
Neutrophils Relative %: 69 % (ref 43–77)

## 2010-11-22 LAB — PREPARE FRESH FROZEN PLASMA

## 2010-11-22 LAB — CARDIAC PANEL(CRET KIN+CKTOT+MB+TROPI)
CK, MB: 1.4 ng/mL (ref 0.3–4.0)
CK, MB: 1.8 ng/mL (ref 0.3–4.0)
Relative Index: INVALID (ref 0.0–2.5)
Total CK: 56 U/L (ref 7–232)

## 2010-11-22 LAB — BLOOD GAS, ARTERIAL
Acid-Base Excess: 0.5 mmol/L (ref 0.0–2.0)
Bicarbonate: 24.6 mEq/L — ABNORMAL HIGH (ref 20.0–24.0)
Drawn by: 296031
O2 Saturation: 96.7 %
Patient temperature: 98.6
TCO2: 25.8 mmol/L (ref 0–100)
pCO2 arterial: 39.5 mmHg (ref 35.0–45.0)
pH, Arterial: 7.411 (ref 7.350–7.450)
pO2, Arterial: 79.3 mmHg — ABNORMAL LOW (ref 80.0–100.0)

## 2010-11-22 LAB — CK TOTAL AND CKMB (NOT AT ARMC)
CK, MB: 1.9 ng/mL (ref 0.3–4.0)
Total CK: 80 U/L (ref 7–232)

## 2010-11-22 LAB — PROTIME-INR
INR: 1 (ref 0.00–1.49)
INR: 1.4 (ref 0.00–1.49)
Prothrombin Time: 13.3 seconds (ref 11.6–15.2)
Prothrombin Time: 18 seconds — ABNORMAL HIGH (ref 11.6–15.2)

## 2010-11-22 LAB — URINE MICROSCOPIC-ADD ON

## 2010-11-22 LAB — HEMOGLOBIN A1C
Hgb A1c MFr Bld: 10.5 % — ABNORMAL HIGH (ref 4.6–6.1)
Mean Plasma Glucose: 255 mg/dL

## 2010-11-22 LAB — HEPARIN LEVEL (UNFRACTIONATED): Heparin Unfractionated: 0.37 IU/mL (ref 0.30–0.70)

## 2010-11-22 LAB — PREPARE PLATELETS

## 2010-11-22 LAB — APTT: aPTT: 35 seconds (ref 24–37)

## 2010-11-22 LAB — HEMOGLOBIN AND HEMATOCRIT, BLOOD: Hemoglobin: 6.7 g/dL — CL (ref 13.0–17.0)

## 2010-11-22 LAB — MAGNESIUM: Magnesium: 2 mg/dL (ref 1.5–2.5)

## 2010-11-23 LAB — GLUCOSE, CAPILLARY: Glucose-Capillary: 320 mg/dL — ABNORMAL HIGH (ref 70–99)

## 2010-11-23 LAB — POCT I-STAT 3, VENOUS BLOOD GAS (G3P V)
Bicarbonate: 22.1 mEq/L (ref 20.0–24.0)
O2 Saturation: 63 %
TCO2: 23 mmol/L (ref 0–100)

## 2010-11-23 LAB — POCT I-STAT 3, ART BLOOD GAS (G3+)
Acid-base deficit: 3 mmol/L — ABNORMAL HIGH (ref 0.0–2.0)
O2 Saturation: 95 %
pO2, Arterial: 76 mmHg — ABNORMAL LOW (ref 80.0–100.0)

## 2010-12-28 NOTE — Assessment & Plan Note (Signed)
OFFICE VISIT   Minturn, Orell I  DOB:  1946-05-25                                        March 16, 2009  CHART #:  [id number]   HISTORY:  The patient is a 65 year old gentleman, who underwent coronary  artery bypass grafting x3 and aortic valve replacement with a 23  pericardial [facility] Ease valve on February 11, 2009, by Dr. Kathlee Nations  Trigt.  He was originally seen in office followup on March 04, 2009.  At  that time, the patient had some cellulitis and induration of the soft  tissue in his right lower extremity.  He had a history of being treated  at [facility] in 1 week after discharge from his cardiac  surgery with intravenous antibiotics and he was discharged on Augmentin  and doxycycline.  At the time of the office visit, he had shown  improvement but still had persistent cellulitis and was given an  additional 10 days of both Augmentin and doxycycline by Dr. Donata [name].  He was scheduled for office visit followup on today's date.   PHYSICAL EXAMINATION:  VITAL SIGNS:  Blood pressure is 155/87, pulse 85,  respirations 18, and oxygen saturation is 95%.  EXTREMITIES:  The right lower extremity is examined.  He has significant  edema.  However, no evidence of cellulitis.  The incision is inspected  and reveals no significant induration.  There is no evidence of  flocculence or drainage.  CHEST:  The chest incision is also inspected and healing quite well  without evidence of infection.   ASSESSMENT:  The patient is making continued ongoing progress in his  recovery from his cardiac surgery and postoperative right lower  extremity cellulitis.  He is encouraged to continue  his antibiotics to the completion of the treatment.  I have instructed  him that there do not appear to be any current surgical issues and we  will see him again on a p.r.n. basis.   Rowe Clack, P.A.-C.   Sherryll Burger  D:  03/16/2009  T:  03/17/2009  Job:  [id number]   cc:    TCTS Chart  Antonieta Iba, MD  Madelin Rear. Sherwood Gambler, MD

## 2010-12-28 NOTE — Consult Note (Signed)
[name], [name]             ACCOUNT NO.:  [id number]   MEDICAL RECORD NO.:  [id number]          PATIENT TYPE:  INP   LOCATION:  2399                         FACILITY:  [facility]   PHYSICIAN:  Kerin Perna, M.D.  DATE OF BIRTH:  1946-07-07   DATE OF CONSULTATION:  02/10/2009  DATE OF DISCHARGE:                                 CONSULTATION   REQUESTING PHYSICIAN:  Antonieta Iba, MD.   REASON FOR CONSULTATION:  Aortic stenosis, moderate coronary disease.   CHIEF COMPLAINT:  Shortness of breath, progressive   PRESENT ILLNESS:  [name] is a 65 year old Caucasian male who has  been evaluated previously by Dr. Mariah Milling for progressive shortness of  breath; and by catheterization and 2-D echocardiograms has demonstrated  to have significant aortic stenosis and moderate multivessel coronary  artery disease.  He developed acute worsening of his symptoms, which  lasted most of the day; and complained of throat burning, palpitations,  presyncope and shortness of breath.  He was found to be in atrial  fibrillation in the emergency room, and was treated with amiodarone and  admitted.  He had not seen a cardiothoracic surgeon for a consultation  of his aortic and coronary disease, and a thoracic surgical evaluation  was requested.   The patient does not have a long history of atrial fibrillation.  When  he was previously evaluated by his cardiologist he was in a sinus  rhythm.  His overall LV function has been good by echocardiogram and  catheterization, but he does have left ventricular hypertrophy.  His  aortic valve gradient by previous studies was 45 mmHg mean, and aortic  valve area was cannulated at approximately 1.0.  He was found to have a  70% stenosis of the circumflex marginal, 60% stenosis of the right  coronary, and 50% stenosis of his LAD.  He had no mitral valve disease.   He was felt to be a candidate for aortic valve replacement and  multivessel bypass surgery.  He  converted to a sinus rhythm after his  admission to the hospital, and was placed in the CCU.  His pre-CABG  Doppler studies showed no significant carotid disease.   PAST MEDICAL HISTORY:  1. Hypertension.  2. Diabetes.  3. Hyperlipidemia.  4. Obesity.  5. COPD.  6. 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  He is able to ambulate and carry on his activities of      daily living.  He is, however, disabled and lives with his sister.   HOME MEDICATIONS:  1. Zocor 80 mg q.h.s.  2. Lantus insulin 75 units b.i.d.  3. Aspirin 81 mg daily.  4. Lasix 20 mg daily.  5. Neurontin 300 mg b.i.d.  6. Glipizide 10 mg b.i.d.  7. Prilosec 20 mg daily.  8. Azor 5/20 one p.o. daily.  9. Xanax 0.5 mg t.i.d.  10.Humalog insulin 15 units b.i.d.   ALLERGIES:  None.   FAMILY HISTORY:  Positive diabetes, emphysema, coronary disease and  valve surgery.   SOCIAL HISTORY:  The patient  is retired from working on a farm.  He  stopped smoking 8 years ago.  He does not use alcohol.  He has been on  disability since the stroke and lives with his sister.   REVIEW OF SYSTEMS:  CONSTITUTIONAL:  Review is negative for fever or  weight loss.  ENT:  Review is negative for difficulty swallowing.  He  has had total dental extractions and is edentulous.  THORACIC:  Review  is negative for history of chest trauma or abnormal chest x-ray.  CARDIAC:  Review is positive for his aortic valve disease, murmur and  coronary artery disease.  No history of MI.  GI:  Review is negative for  hepatitis, jaundice or blood per rectum.  ENDOCRINE:  Review is positive  for diabetes.  Negative for thyroid disease.  VASCULAR:  Review is  negative DVT, positive for his stroke 8 years ago.  No history of  bleeding problems or blood transfusion.  His last hospitalization was  for his stroke.   PHYSICAL EXAMINATION:  The patient is 5 feet 10 inches and weighs 240  pounds.  Blood pressure is  110/70, pulse 70 in sinus, temperature 97.8.  GENERAL APPEARANCE:  An obese Caucasian male in no acute distress, on  the intermediate care unit.  He is accompanied by 2 sisters.  HEENT:  Exam is normocephalic.  He is edentulous.  NECK:  Without JVD, mass or bruit.  LYMPHATICS:  Show no palpable adenopathy in his neck or supraclavicular  fossa.  CHEST:  Breath sounds are clear and there is no thoracic deformity.  CARDIAC:  Reveals a regular rhythm with a 3/6 systolic ejection murmur;  no gallop.  ABDOMEN:  Obese, without pulsatile mass.  EXTREMITIES:  Reveals no clubbing, cyanosis or edema.  Peripheral pulses  are intact.  NEUROLOGIC:  Shows movement in all extremities, but weakness in his  right arm.   Reviewed the coronary arteriogram and his catheterization performed in  the recent 2-3 weeks, and agree with impression of severe coronary  disease and aortic stenosis.  He would benefit from AVR CABG, and we  will schedule his surgery expeditiously during this hospitalization.  I  discussed the procedure in detail with the patient and his sisters.  He  understands the plan to use a bioprosthetic valve to avoid Coumadin and  the benefits and risks (stroke, bleeding, MI, death).      Kerin Perna, M.D.  Electronically Signed     PV/MEDQ  D:  02/11/2009  T:  02/11/2009  Job:  [id number]

## 2010-12-28 NOTE — Assessment & Plan Note (Signed)
OFFICE VISIT   [name], [name]  DOB:  [birth year]                                        March 04, 2009  CHART #:  [id number]   CURRENT PROBLEMS:  1. Status post coronary artery bypass graft x3 and aortic valve      replacement on February 11, 2009, (left internal mammary artery to left      anterior descending, vein graft to obtuse marginal and posterior      descending, aortic valve replacement with a 23-mm pericardial      Edwards valve).  2. Cellulitis of the right lower extremity at the saphenous vein      harvest site treated with IV antibiotics during readmission at      [facility] in July 2010.  3. Transient postoperative atrial fibrillation converted to sinus      rhythm, on amiodarone.  4. Insulin-dependent diabetes mellitus.  5. Chronic obstructive pulmonary disease.   PRESENT ILLNESS:  The patient returns for a first office visit after  multivessel bypass grafting combined with aortic valve replacement in  late June.  [name] developed a cellulitis in his right lower extremity below  the knee in the saphenous vein tunnel and was treated with IV  antibiotics and discharged from [facility] on oral Augmentin and  doxycycline.  The cellulitis is significantly improved and [name] has no  erythema or warmth and mild swelling of the pretibial area in the right  leg.  [name] has had no recurrent angina and sternal incision is healing  well.  His diabetes has been difficult to control as usual.   Other than the Augmentin and doxycycline, [name] is taking aspirin 81 mg,  Coreg 3.125 b.[name].d., Lantus insulin 20 units nightly, Humulin 70/30  insulin 75 units b.[name].d., Neurontin b.[name].d., Prilosec, iron, amiodarone  200 mg daily, Mucinex, and NicoDerm patch.   PHYSICAL EXAMINATION:  VITAL SIGNS:  Blood pressure 130/80, pulse 86 and  regular, respirations 18, and saturation 96%.  GENERAL:  [name] is alert and oriented.  LUNGS:  Breath sounds are clear and equal.  CHEST:   The sternum is stable and well healed.  CARDIAC:  Rhythm is regular without rub or murmur.  EXTREMITIES:  The right leg incision has healed.  [name] has some edema in  his extremity from the knee to the ankle.  There is no warmth, erythema,  or fluctuance.  There is induration of the tissue, however.   PA and lateral chest x-ray shows some mild basilar atelectasis,  otherwise baseline cardiomegaly.  No significant pleural effusion.  The  sternal wires are well aligned.   PLAN:  The patient still has some remaining cellulitis with induration  of the soft tissue of his right lower extremity.  [name] gave him a refill  for his Augmentin and doxycycline to take another 10 days.  [name] will  return to the office for a wound check on March 16, 2009.   Kerin Perna, M.D.  Electronically Signed   PV/MEDQ  D:  03/04/2009  T:  03/05/2009  Job:  [id number]   cc:   Antonieta Iba, MD

## 2010-12-28 NOTE — Discharge Summary (Signed)
NAMETIAN, MCMURTREY             ACCOUNT NO.:  [id number]   MEDICAL RECORD NO.:  [id number]          PATIENT TYPE:  INP   LOCATION:  2017                         FACILITY:  [facility]   PHYSICIAN:  Kerin Perna, M.D.  DATE OF BIRTH:  February 25, 1946   DATE OF ADMISSION:  02/09/2009  DATE OF DISCHARGE:  02/17/2009                               DISCHARGE SUMMARY   PRIMARY ADMITTING DIAGNOSES:  1. Chest pain.  2. Shortness of breath.   ADDITIONAL/DISCHARGE DIAGNOSES:  1. Coronary artery disease.  2. Significant aortic stenosis.  3. Atrial fibrillation.  4. Insulin-dependent diabetes mellitus.  5. Hypertension.  6. Hyperlipidemia.  7. Chronic obstructive pulmonary disease.  8. History of cerebrovascular accident with residual right      hemiparesis.  9. Obesity.  10.History of tobacco abuse.   PROCEDURES PERFORMED:  1. Cardiac catheterization.  2. Coronary artery bypass grafting x3 (left internal mammary artery to      the distal left anterior descending, saphenous vein graft to the      circumflex marginal, saphenous vein graft to the posterior      descending).  3. Aortic valve replacement with 23-mm [facility] Ease pericardial      tissue valve.  4. Endoscopic vein harvest, right leg.   HISTORY:  The patient is a 65 year old male with a known history of  coronary artery disease and severe aortic stenosis.  He had actually  been referred to see Dr. Cornelius Moras as an outpatient consultation for aortic  valve replacement and coronary artery bypass grafting but had not made  it to his appointment yet as he was scheduled for mid July.  On the date  of this admission, he presented to the emergency department complaining  of presyncope with chest discomfort and progressive shortness of breath.  According to the patient, he was only able to walk a few feet before he  developed burning in his throat and his chest with palpitations.  On  presentation to the ER, he was noted to have  paroxysmal atrial  fibrillation with rapid ventricular response rates in the 110s.  He also  felt presyncopal.  He was subsequently admitted for further evaluation  and cardiac workup.   HOSPITAL COURSE:  Mr. Novack was admitted under the Cardiology Service  and was started on IV amiodarone as well as IV heparin.  A TCTS consult  was obtained and the patient was seen by Dr. Donata [name] for consideration  of surgical revascularization and possible aortic valve replacement.  At  review of his cardiac catheterization, he was noted to have a 70%  stenosis of the circumflex marginal with a 60% stenosis of the right  coronary, 50% stenosis of the LAD, and significant aortic stenosis by  echocardiogram.  He was felt to be a candidate for surgical  intervention.  Dr. Donata [name] explained all the risks, benefits, and  alternatives of surgery to the patient and he agreed to proceed.  He  remained stable and his pain resolved prior to surgery.  He also had  converted to normal sinus rhythm after initiation of amiodarone.  He was  taken to the operating room on February 11, 2009 and underwent AVR and CABG  as described above.  Please see previously dictated operative report for  complete details of surgery.  He tolerated the procedure well and was  transferred to the SICU in stable condition.  He was able to be  extubated shortly after surgery.  He was hemodynamically stable and  doing well on postop day #1.  He initially required a transfusion for an  acute blood loss anemia.  He was also started on an iron supplement.  His tubes and lines were removed and he was kept in the unit for further  observation.  By the end of postop day #2, he was ready for transfer to  the Step-Down Unit.  His postoperative course has been relatively  uneventful.  He has maintained normal sinus rhythm and has been  maintained on amiodarone and Lopressor.  He has also been restarted on  his home diabetes medications and his  blood sugars have remained stable.  He has been volume overloaded postoperatively and has been started on  Lasix to which he is responding well.  He does remain edematous on  physical exam and his weight remains mildly elevated from his  preoperative weight.  His incisions are all healing well.  His most  recent labs show sodium 136, potassium 4.0, BUN 32, and creatinine 1.24.  Hemoglobin 9.0, hematocrit 26.5, white count 10.4, and platelets 187.  He is ambulating in the halls with cardiac rehab phase I and is  progressing well.  He has seen physical therapy in light of his history  of residual right-sided weakness from prior CVA and they do recommend  Home Health PT as well as a rolling walker.  He has also had some  productive cough with some purulent sputum and has been started on  Ceftin 250 mg b.i.d.  He has remained afebrile.  It is felt that if he  remained stable over the next 24 hours, he will be ready for discharge  home, hopefully on February 17, 2009.   DISCHARGE MEDICATIONS:  1. Enteric-coated aspirin 81 mg daily.  2. Lopressor 25 mg b.i.d.  3. Nicoderm CQ 14 mg patch daily.  4. Mucinex 600 mg b.i.d.  5. Lasix 40 mg daily x1 week.  6. Potassium 20 mEq daily x1 week.  7. Amiodarone 200 mg b.i.d.  8. Nu-Iron 150 mg daily.  9. Ceftin 500 mg b.i.d. x1 week.  10.Zocor 80 mg at bedtime.  11.Lantus 75 units b.i.d.  12.Neurontin 300 mg b.i.d.  13.Prilosec 20 mg daily.  14.Humalog 50/50 15 units b.i.d.  15.Oxycodone 5 mg one to two q.4 h. p.r.n. for pain.   DISCHARGE INSTRUCTIONS:  He is asked to refrain from driving, heavy  lifting, or strenuous activity.  He may continue ambulating daily and  using his incentive spirometer.  He may shower daily and clean his  incisions with soap and water.  He will continue low-fat, low-sodium,  carbohydrate modified diet.   DISCHARGE FOLLOWUP:  He will need to make an appointment to see Dr.  Mariah Milling in 2 weeks.  He will also be contacted with  an appointment to see  Dr. Donata [name] in 3 weeks with a chest x-ray.  In the interim, if he  experiences any problems or has questions, he is asked to contact our  office immediately.      Coral Ceo, P.A.      Kerin Perna, M.D.  Electronically Signed  GC/MEDQ  D:  02/16/2009  T:  02/17/2009  Job:  [id number]   cc:   Antonieta Iba, MD  Madelin Rear. Sherwood Gambler, MD

## 2010-12-28 NOTE — Discharge Summary (Signed)
NAMEALDRED, [name]             ACCOUNT NO.:  [id number]   MEDICAL RECORD NO.:  [id number]          PATIENT TYPE:  OIB   LOCATION:  2899                         FACILITY:  [facility]   PHYSICIAN:  Antonieta Iba, MD   DATE OF BIRTH:  [birth year]   DATE OF ADMISSION:  01/07/2009  DATE OF DISCHARGE:  01/07/2009                               DISCHARGE SUMMARY   IDENTIFICATION:  [name] is a pleasant 65 year old gentleman seen by  myself in clinic in [place] at [facility]  for diabetes, hypertension, remote smoke with worsening aortic stenosis.  He has continued episodes of chest burning and throat burning.  He is  brought to the cardiac catheterization lab after his transthoracic echo  showed severe aortic stenosis with a valve area of 0.7 cm2, mean  gradient of 38 mmHg and a maximum gradient of 70 mmHg with a normal LV  function with mild LVH.  His aortic valve is moderately sclerotic.   The risks and benefits of the procedure were discussed with the patient  and consent was obtained.  He was brought to the cardiac catheterization  lab and prepped and draped in the usual sterile fashion.  A modified  Seldinger technique was used to engage the right femoral artery and a 5-  French introducer sheath was placed.  A 7-French introducer sheath was  placed into the left femoral vein.  Right heart pressures were obtained  using a Swan catheter.  Catheter was removed and then a 5-French JL4 and  a JR4 catheter were used to engage the left main ostium and the RCA  ostium respectively.  Hand injection of contrast was used to visualize  the coronary anatomy and recorded with cinematography.  The pigtail  catheter was used to try to cross the aortic valve although this was  unsuccessful.  An AL catheter and various wires were used to try to  cross the aortic valve, although again this was unsuccessful.  A  multipurpose catheter was also used with various wires and  again we were  unsuccessful to across the aortic valve.  Hand injection of the aortic  root showed restriction of the aortic valve leaflets.  At the end of the  case, the catheters were removed and sheath removed, manual pressure  held and hemostasis obtained.  A 100 mL of contrast was used for the  case.  No complications were recorded at the time of this dictation.   Right atrial pressure, mean of 5, RV pressure 32/4 with a mean of 10, PA  pressure 32/9 with a mean of 20, pulmonary capillary wedge pressure of  13 with a cardiac output of 6.36 and cardiac index of 2.78.   CORONARY ANATOMY:  Left main:  Left main is a moderate-to-large sized  vessel that bifurcates into the LAD and the left circumflex.  There is  no significant disease noted.   Left anterior descending:  LAD is a moderate-to-large sized vessel,  although from the mid-to-distal region it seems to taper and is mild-to-  moderate in size.  There is 2 diagonal branches that have  mild diffuse  disease.  D2 appears to be a bifurcating vessel.   Left circumflex:  Left circumflex is a moderate sized vessel that has  several obtuse marginal branches.  The OM1 has severe proximal and mid  disease, estimated at 80% with sequential lesions.  OM2 has 60-70%  proximal disease and bifurcates more distally.  The 2 circ and distal OM  are smaller in caliber with only mild luminal irregularities.   Right coronary artery:  RCA is a dominant vessel with distal PDA and PL  branches.  There is 50-60% proximal focal RCA disease.  There is also  50% PL branch disease at the ostial proximal region.   FINAL IMPRESSION:  Severe first obtuse marginal and moderate-to-severe  second obtuse marginal disease, moderate right coronary artery disease  in the proximal region ends distally.  We are unable to cross the aortic  valve with numerous wires and catheters.  Nonselective aortic root shot  showed restriction of the aortic valve leaflets.  Given  that we are  unable to cross the aortic valve, we will set him up for a  transesophageal echo to further evaluate his aortic valve and confirm  that his aortic stenosis is severe.  If we confirmed severe aortic  stenosis, we will refer him to cardiac surgery for further evaluation  for aortic valve replacement and possible bypass of his first obtuse  marginal and second obtuse marginal.      Antonieta Iba, MD  Electronically Signed     TJG/MEDQ  D:  01/07/2009  T:  01/08/2009  Job:  [id number]   cc:   Madelin Rear. Sherwood Gambler, MD

## 2010-12-28 NOTE — Consult Note (Signed)
[name], [name]             ACCOUNT NO.:  [id number]   MEDICAL RECORD NO.:  [id number]          PATIENT TYPE:  INP   LOCATION:  A320                          FACILITY:  [facility]   PHYSICIAN:  Sheliah Mends, MD      DATE OF BIRTH:  1946-04-29   DATE OF CONSULTATION:  02/23/2009  DATE OF DISCHARGE:                                 CONSULTATION   REQUESTING PHYSICIAN:  [name].   CONSULTING PHYSICIAN:  Sheliah Mends, MD   REASON FOR CONSULTATION:  History of coronary artery disease, status  post CABG, aortic valve replacement, and paroxysmal atrial fibrillation.   HISTORY OF PRESENT ILLNESS:  [name] is a 65 year old gentleman, who  has been followed by [name] for a history of coronary artery disease  and aortic stenosis, who presented to [facility] on February 22, 2009, with swelling and pain of the right leg for 2 days.  [name]  was diagnosed with severe multivessel coronary artery disease on Jan 07, 2009.  At the same time, he underwent imaging of his aortic valve by 2-D  echocardiography as well as TE and was found to have severe aortic  stenosis.  During cardiac catheterization, we were unable process aortic  valve.  He was subsequently referred to [name] for further evaluation  for aortic valve replacement and coronary artery bypass grafting and  underwent surgery with [name] on February 11, 2009.  He  underwent coronary artery bypass grafting with left internal mammary  artery to the distal left anterior descending, saphenous vein graft to  the circumflex marginal, and saphenous vein graft to the posterior  descending.  In addition, he received a 23-mm pericardial tissue valve,  [employer].  His saphenous veins were harvested endoscopically  without immediate complications.  The patient's perioperative course was  complicated by an episode of atrial fibrillation with rapid ventricular  response.  The patient's postoperative course was  unremarkable.  He had  an excellent recovery after surgery with extubation on postop day #1.  He has maintained normal sinus rhythm after his surgery on amiodarone  and Lopressor.  He was subsequently discharged on February 16, 2009, and  returned to [facility] with complaints of right lower leg  swelling and erythema.   He was admitted to [facility] with diagnosis of cellulitis.  Blood cultures obtained on admission has been negative so far.  He was  appropriately started on broad-spectrum antibiotic therapy with Zosyn.   From a cardiac standpoint, [name] has done well.  He denies any  lightheadedness, dizziness, chest pain, shortness of breath, and  orthopnea.   PAST MEDICAL HISTORY:  1. Severe multivessel coronary artery disease, status post coronary      artery bypass grafting on January 26, 2009.  2. Severe aortic stenosis, status post aortic valve replacement with a      bioprosthetic tissue valve on January 26, 2009.  3. Type 2 diabetes mellitus.  4. Hypertension.  5. Dyslipidemia.  6. Obesity.  7. COPD.  8. 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   OUTPATIENT MEDICATIONS:  1. Aspirin 81 mg p.o. daily.  2. Lopressor 25 mg p.o. b.i.d.  3. NicoDerm as directed.  4. Mucinex as directed.  5. Lasix 40 mg p.o. daily for 1 week.  6. Potassium 20 mEq p.o. daily.  7. Amiodarone 200 mg p.o. b.i.d.  8. Iron supplementation.  9. Zocor 80 mg p.o. nightly.  10.Lantus 75 units b.i.d.  11.Neurontin 300 mg p.o. b.i.d.  12.Prilosec 20 mg p.o. daily.  13.Humalog 50/50, 15 units b.i.d.  14.Oxycodone as directed.   INPATIENT MEDICATIONS:  1. Amiodarone 200 mg p.o. b.i.d.  2. Aspirin 81 mg p.o. daily.  3. Lovenox 40 mg [place] daily.  4. Lasix 40 mg IV daily.  5. Gabapentin 300 mg p.o. b.i.d.  6. Insulin as directed.  7. Lisinopril 5 mg p.o. daily.  8. Metoprolol 25 mg p.o. b.i.d.  9. Zosyn as directed.  10.Simvastatin 80 mg  nightly.   SOCIAL HISTORY:  The patient is divorced.  He lives with assist, he has  4 children in [place].  He chews tobacco.  He quit smoking 8  years ago.  He had a 45-pack-year history of tobacco use.  He has been  on disability since his stroke.   FAMILY HISTORY:  The patient's mother had a history of heart disease,  diabetes, and died at age 82.  The patient's father had a history of  COPD and valve surgery, and he died at age 41.  He has 4 sisters and 1  daughter with coronary artery disease.   REVIEW OF SYSTEMS:  Positive as above.   PHYSICAL EXAMINATION:  GENERAL:  The patient is alert and oriented x3.  VITAL SIGNS:  Blood pressure 105/53, heart rate of 69, respirations 18,  and temperature is afebrile.  HEENT:  Normocephalic, atraumatic.  Pupils are reactive to light and  accommodation.  NECK:  Supple.  Normal JVP.  No carotid bruit.  CHEST/LUNGS:  Clear to auscultation bilaterally.  HEART:  Regular rate and rhythm without murmur.  ABDOMEN:  Soft, nontender, nondistended, and obese.  EXTREMITIES:  Right lower extremity with swelling and erythema up to the  knee.   EKG is currently not available.   Echocardiogram from February 11, 2009, left ventricular function mildly  reduced with ejection fraction, mild to moderately reduced with an  ejection fraction of 40-45%, and then aortic tissue valve with good  placement and function.   IMPRESSION:  1. Cellulitis at the right lower extremity.  2. Coronary artery disease, status post coronary artery bypass graft.  3. History of severe aortic stenosis, status post aortic valve      replacement with bioprosthetic valve, stable.  4. Hypertension.  5. Dyslipidemia.  6. Type 2 diabetes mellitus.   RECOMMENDATIONS:  [name] has a complicated cardiac and medical  history.  He is now admitted with cellulitis and has been appropriately  started on broad-spectrum antibiotic therapy.  His blood cultures are  currently  negative as no evidence on clinical exam for endocarditis.  The patient should be treated with an extended course of IV antibiotics  for a minimal of 5 days and possibly longer depending on his clinical  course.   From a cardiac standpoint, the patient has done very well after his  surgery.  He will be continued with cardiac rehabilitation.  His  coronary artery disease and aortic disease has remained stable.   The patient has a history  of paroxysmal atrial fibrillation documented  during his recent bypass surgery.  Currently, there is no evidence of  recurrence of his paroxysmal atrial fibrillation. He does not require  anticoagulation with Coumadin at this point.   The patient postoperatively had an echocardiogram indicating mild to  moderately reduced left ventricular function.  The patient has been  appropriately started on an ACE inhibitor therapy.  He may benefit from  switch of his metoprolol to Coreg.  This could be implemented in an  outpatient setting as well.  His amiodarone dose can be reduced upon  discharge to 200 mg p.o. daily.   Thank you for your kind referral.  Please do no hesitate to contact me  should you have any questions or concerns.  We will follow with you.  Thank you for allowing me to assist in care of this nice gentleman.      Sheliah Mends, MD  Electronically Signed     JE/MEDQ  D:  02/23/2009  T:  02/24/2009  Job:  [id number]

## 2010-12-28 NOTE — Discharge Summary (Signed)
[name], [name]             ACCOUNT NO.:  [id number]   MEDICAL RECORD NO.:  [id number]          PATIENT TYPE:  INP   LOCATION:  A320                          FACILITY:  [facility]   PHYSICIAN:  Lonia Blood, M.D.      DATE OF BIRTH:  04-Dec-1945   DATE OF ADMISSION:  02/21/2009  DATE OF DISCHARGE:  07/15/2010LH                               DISCHARGE SUMMARY   PRIMARY CARE PHYSICIAN:  Rickey Rear. Sherwood Gambler, MD   DISCHARGE DIAGNOSES:  1. Right lower extremity cellulitis with recent vein graft.  2. Recent coronary artery bypass graft and aortic valve repair on February 11, 2009, recovering from that now.  3. Coronary artery disease.  4. Ongoing atrial fibrillation.  5. Insulin-dependent diabetes.  6. Hypertension.  7. Dyslipidemia.  8. Chronic obstructive pulmonary disease.  9. History of recent cerebrovascular accident.  10.Obesity.  11.History of tobacco abuse.   DISCHARGE MEDICATIONS:  1. Coreg 3.125 mg p.o. b.i.d.  2. Doxycycline 100 mg p.o. b.i.d. for 10 days.  3. Augmentin 875 mg p.o. b.i.d. for 10 days.  4. Aspirin 81 mg daily.  5. Lasix 40 mg daily as needed.  6. Potassium chloride 20 mEq daily.  7. Amiodarone 200 mg changed to daily.  8. Nu-Iron 150 mg daily.  9. Zocor 80 mg daily.  10.Lantus insulin 20 units subcutaneously at night.  11.Gabapentin 300 mg b.i.d.  12.Prilosec 20 mg daily.  13.Humalog 50/50, 15 units subcutaneously twice a day.  14.Mucinex 600 mg twice a day.  15.Oxycodone and acetaminophen 5/325 one tablet daily.   DISPOSITION:  The patient will be discharged home with home health PT  and OT.  He will complete antibiotics at home.  Further treatment will  depend on how he does after discharge.   PROCEDURES PERFORMED:  Right lower extremity venous ultrasound that  showed no evidence of DVT on February 21, 2009.  Chest x-ray on February 21, 2009, that showed poor inspiration with mildly increased left basilar  atelectasis, decreased slightly small bilateral  pleural effusions.   CONSULTATIONS:  [facility].   BRIEF HISTORY AND PHYSICAL:  Please refer to dictated history and  physical on admission by Dr. Margaretmary Dys.  [name], however, the  patient is a 65 year old gentleman who recently underwent coronary  artery bypass grafting as well as aortic valve replacement in June 2010.  The patient was discharged home, but came back with increasing swelling  and pain in the right lower extremity for 2 days.  He has had problem  walking since then.  The pain was so severe and with color change.  He  was progressive starting from the food and all the way to his back.  On  admission, the patient was found to be slightly febrile.  He seems to  have swelling and redness of his right foot around the area where he had  an endoscopic saphenous vein graft harvest.  The site looks well healed,  but around the area, there was edema with erythema and warmth with  multiple vesicles along the callus.  There was also some bruising on the  right ankle, which seemed to have been there after the surgery.  Findings were found to be consistent with cellulitis of the right lower  extremity around the left side.  He was subsequently admitted for  further management.   HOSPITAL COURSE:  1. Cellulitis of the right lower extremity.  The patient was admitted.      Started on IV vancomycin and Zosyn.  The patient seemed to have      responded to treatment over time.  His DVT was also ruled out by      recent Dopplers.  At this point, the patient is afebrile.  The      swelling has subsided.  The redness was disappeared and the patient      seemed to be doing better.  We have transitioned him to oral      doxycycline as well as Augmentin for another 10 more days.  2. Coronary artery disease, status post coronary artery bypass graft      recently.  The patient seemed to be okay.  His symptoms seemed to      have improved.  3. Diabetes.   The patient has significantly used less insulin in the      hospital.  At this point, he is on Lantus 20 units with the Humalog      50/50 at home.  His dose could be adjusted as an outpatient by Dr.      Sherwood [name].  4. Anemia.  This is stable.  Normocytic anemia probably      postoperatively.  Otherwise, the rest of his medical problems are      chronic and the patient seems to be stable at this point for      discharge.      Lonia Blood, M.D.  Electronically Signed     LG/MEDQ  D:  02/26/2009  T:  02/27/2009  Job:  [id number]

## 2010-12-28 NOTE — H&P (Signed)
Rickey, [name]             ACCOUNT NO.:  [id number]   MEDICAL RECORD NO.:  [id number]          PATIENT TYPE:  INP   LOCATION:  A320                          FACILITY:  [facility]   PHYSICIAN:  Margaretmary Dys, M.D.DATE OF BIRTH:  February 25, 1946   DATE OF ADMISSION:  02/21/2009  DATE OF DISCHARGE:  LH                              HISTORY & PHYSICAL   ADMISSION DIAGNOSES:  1. Cellulitis of the right leg involving the right thigh.  2. Cannot rule out deep venous thrombosis.  3. Recent aortic valve replacement and coronary artery bypass grafting      on February 11, 2009, at [facility].   CHIEF COMPLAINT:  Swelling and pain right leg over the past for 2 days.   HISTORY OF PRESENT ILLNESS:  [name] is a 65 year old male who  presented to the emergency room with complaint of severe pain in his  lower extremity.  The patient reports having pain initially in his right  foot, then progressing to his right leg and then to his thigh.  He has  had significant difficulty walking.  He has also noticed some redness  and what appears to be some blisters formation in that medial aspect of  the leg.  The patient was only recently discharged about 4 days ago from  [facility] where he had a CABG and aortic valve replacement  done and was only discharged home about 4 days ago.  The patient denies  any fall.  He denies any fevers or chills.  He denies any weakness in  the limb no chest pain or shortness of breath.  The patient had severe  thick stenosis and had a tissue valve replacement.  He also had atrial  fibrillation with rapid ventricular response, but seems to be sinus  controlled on amiodarone.  Denies any nausea or vomiting.  No abdominal  pain.   REVIEW OF SYSTEMS:  Essentially negative except as mentioned in history  of present illness above.   PAST MEDICAL HISTORY:  1. Coronary artery disease.  2. Chronic obstructive pulmonary disease.  3. Hypertension.  4. Severe  aortic stenosis status post aortic valve replacement with      tissue valve.  5. Dyslipidemia.  6. History of stroke with residual right hemiparesis.  7. Atrial fibrillation, rapid regular response.  8. The patient in sinus rhythm.  9. Independent diabetes mellitus.  10.Hypertension.  11.History of obesity.  12.History of chronic tobacco abuse.   PAST SURGICAL HISTORY:  1. Aortic valve replacement.  2. CABG.   MEDICATIONS:  1. Aspirin 81 mg p.o. once a day.  2. Lopressor 25 mg p.o. b.i.d.  3. Nicoderm CQ 14 mg topically daily.  4. Mucinex 600 mg b.i.d.  5. Lasix 40 mg daily for one week.  6. Potassium 20 mg daily for only one week.  7. Amiodarone 200 mg p.o. b.i.d.  8. Iron tablets 150 mg daily.  9. Zocor 80 mg at bedtime.  10.Lantus 75 units b.i.d.  11.Ceftin 500 mg b.i.d. for one week.  12.Neurontin 300 mg b.i.d.  13.Prilosec 20 mg p.o. daily.  14.Humalog 50/50  15 units b.i.d.  15.Oxycodone 5 mg 1-2 tablets q.4 h p.r.n. for pain.   ALLERGIES:  NO KNOWN DRUG ALLERGIES.   FAMILY HISTORY:  Mother had diabetes and had complications,  died at age  of 110.  Father had emphysema and also had valve surgery.  He died at age  20.  He has four sisters of one brother who also has coronary artery  disease who was diseased.   SOCIAL HISTORY:  The patient lives with a sister.  He has four children  in [place].  He is twice divorced.  He chews tobacco.  Quit  smoking about 8 years ago.  Used to smoke about two and a half pack  years with over 100 pack-year history of smoking.  He does not drink  alcohol.  The patient was previously Museum/gallery curator.  He is currently  on disability due to his stroke.   PHYSICAL EXAMINATION:  GENERAL:  The patient was conscious, alert,  comfortable, not in acute distress, well-oriented in time, place and  person.  VITAL SIGNS:  Blood pressure was 120/66, pulse of 70, respiration was  20, temperature 97.6 degrees Fahrenheit, oxygen saturation  was 94% on  room air.  HEENT:  Exam normocephalic, atraumatic.  Oral mucosa was moist with no  exudates.  NECK:  Supple.  No JVD, lymphadenopathy.  LUNGS:  Clear clinically with good air entry bilaterally.  HEART:  S1-S2 regular.  No S3, S4, gallops or rubs.  ABDOMEN:  Was obese but soft, nontender.  Bowel sounds positive.  EXTREMITIES:  The patient had a saphenous vein graft harvest on the  right leg.  The actual site on the medial aspect of the right knee for  the graft have especially appears well-healed.  The patient has a  pitting edema on the right leg with some erythema and warmth with  multiple vesicles along the calf.  The patient also had some bruising on  the right ankle which by report has been there since surgery.  He also  reports some erythema around the incision site on the medial aspect of  the day.  The patient is in the leg and thigh and calf, diffusely  tender.  The patient is unable to extend and flex the right knee without  any difficulty, although with some pain.  The patient's right foot is  warm.  The patient has dorsalis pedis pulsation.  Clearly, does have  some induration extending from his lower leg up to his medial thigh.  CNS exam was grossly intact.   LABORATORY AND DIAGNOSTIC DATA:  White blood cells 12.5, hemoglobin of  9.8, hematocrit 29.6, platelet count was 359.  Sodium is 136, potassium  is 4.4, chloride of 99, CO2 was 27, glucose 148, BUN is 12, creatinine  was 1.13, calcium was 9.4.  Chest x-ray shows improved bilateral pleural  effusions and atelectasis compared to previous.   ASSESSMENT/PLAN:  [name] is a 64 year old male with a recent  history of coronary artery disease and severe aortic stenosis status  post aortic valve replacement on February 11, 2009.  The patient was  discharged on 4 days ago and apparently was doing fine until yesterday  when he noted increased swelling pain and tenderness in his right leg  with some vesicles.  The  patient has no history of fevers and has no  significant leukocytosis.  The patient will actually discharge home on  some antibiotics which he said he took and was actually to remain  on  them because they were given to him for one week.   This potentially could be a cellulitis, especially with warmth and  tenderness and vesicles that were seen.  However, the possibility of a  deep venous thrombosis can not be ruled out.   PLAN:  1. Will admit the patient to the medical floor.  2. Will initiate IV antibiotic therapy with vancomycin and Zosyn.  3. Will place an IV fluids normal saline at 75 mL an hour.  4. Will keep right leg elevated.  5. Will fully anticoagulate the patient for now with Lovenox 1 mg      subcutaneously q.12 h and will obtain a Doppler ultrasound in the      morning tomorrow to rule out a deep venous thrombosis.  6. Will resume all his home medications will hold his diuretics at      this time.  7. His thoracic surgeon is aware of this hospitalization but has      requested that we keep the patient here in the [facility],      treat with antibiotics and follow up with [place] and will need to      transfer if needed.  8. The patient remains hemodynamically stable at this time.  I      discussed the above plan with the patient who verbalized full      understanding.   CODE STATUS:  The patient is a full code.      Margaretmary Dys, M.D.  Electronically Signed     AM/MEDQ  D:  02/22/2009  T:  02/22/2009  Job:  [id number]

## 2010-12-28 NOTE — Op Note (Signed)
[name], [name]             ACCOUNT NO.:  [id number]   MEDICAL RECORD NO.:  [id number]          PATIENT TYPE:  INP   LOCATION:  2308                         FACILITY:  [facility]   PHYSICIAN:  Kerin Perna, M.D.  DATE OF BIRTH:  [birth year]   DATE OF PROCEDURE:  02/11/2009  DATE OF DISCHARGE:                               OPERATIVE REPORT   OPERATION:  1. Coronary artery bypass grafting x3 (left internal mammary artery to      distal left anterior descending, saphenous vein graft to circumflex      marginal, saphenous vein graft to posterior descending).  2. Aortic valve replacement with a 23 pericardial [facility] - EZ      valve, serial number [id number], model number [id number].  3. Endoscopic harvest of right leg greater saphenous vein.   INDICATIONS:  The patient is a 65 year old Caucasian male with a history  of progressive shortness of breath and recent 2-D echo and cardiac cath  showing significant aortic stenosis with a mean transvalvular gradient  of 48 mmHg and a valve area of 1.0 with moderate disease of the LAD, OM,  and proximal distal RCA.  His EF was preserved and he was felt to be a  candidate for multivessel bypass grafting and aortic valve replacement.  The patient was admitted to hospital with acute worsening of his  symptoms due to atrial fibrillation and he was treated with amiodarone  and converted to sinus rhythm with improvement in symptoms.  While he  was hospitalized, a thoracic surgical evaluation was requested and he is  felt to be a candidate for surgery at this time.   Prior to operation, I examined the patient in the Intermediate Care Unit  and results of the cardiac cath with the patient and his family.  I  discussed the indications and expected benefits of coronary artery  bypass surgery and aortic valve replacement.  I reviewed the plan to  perform a bioprosthetic AVR to avoid long-term Coumadin commitment.  I  discussed with him the major  aspects of the surgery including location  of the surgical incisions, the choice of conduit to include internal  mammary artery and endoscopically harvested vein, and the expected  postoperative recovery.  I reviewed with the patient the risks dealing  with operation including a 2-3% risk of major morbidity including  stroke, MI, death, or major hemorrhage.  He understood the issues and  agreed to proceed with surgery and what I felt was an informed consent.   OPERATIVE FINDINGS:  The patient has severe cardiomegaly.  The aortic  valve was calcified and thickened and moderately stenotic.  The  coronaries were severely diseased in a diabetic pattern.  The LAD did  appear to have obstructive disease as the 1.5-mm probe would not pass  proximally from the point of arteriotomy up to the proximal vessel.  The  patient received 2 units of packed cells during surgery for a starting  hematocrit of 31%.  The transesophageal echo postpump showed a good  functioning prosthesis without AI.   PROCEDURE:  The patient was brought to the operative  room and placed  supine on the operating table where general anesthesia was induced.  The  chest, abdomen, and legs were prepped with Betadine and draped as a  sterile field.  A transesophageal 2-D echo probe was placed by the  anesthesiologist which confirmed the preoperative diagnosis.  A sternal  incision was made as the saphenous vein was harvested endoscopically  from the right thigh.  The internal mammary artery was harvested as a  pedicle graft from its origin at the subclavian vessels and was an  adequate vessel with good flow.  A sternal retractor was placed using  the deep blades due to his obese body habitus.  Pericardium was opened  and suspended.  Pursestrings were placed in the ascending aorta and  right atrium and after the vein had been harvested, the patient was  fully heparinized and cannulated and placed on bypass.  The coronaries  were  identified for grafting.  The location of the circumflex vessel was  difficult due to its deep intramyocardial location.  This vessel would  probably not be found on a redo operation in the back of heart.  The  mammary artery and vein grafts were prepared for the distal anastomoses  and cardioplegia catheters were placed for both antegrade and retrograde  cold blood cardioplegia.  The patient was being cooled to 32 degrees.  Aortic crossclamp was applied and 1 L of cold blood cardioplegia was  delivered in split doses between the antegrade aortic and retrograde  coronary sinus catheters.  There was good cardioplegic arrest and septal  temperature dropped to less than 12 degrees.  Cardioplegia was delivered  every 20 minutes or less while the crossclamp was in place.   The distal coronary anastomoses were then performed.  The first distal  anastomosis was to the posterior descending branch of the right.  A  reverse saphenous vein was sewn end-to-side with running 7-0 Prolene  with good flow through the graft.  The second distal anastomosis was to  the OM branch of the left coronary.  It was deeply intramyocardial and  was 1.5-mm vessel and a reverse saphenous vein was sewn end-to-side with  running 7-0 Prolene with good flow through the graft.  Cardioplegia was  redosed.  The third distal anastomosis was to the distal LAD.  This was  1.5-mm vessel and a probe would not pass proximally all the way to the  proximal vessel.  The IMA pedicle was brought through an opening and the  left lateral pericardium was brought down onto the LAD and sewn end-to-  side with running 8-0 Prolene.  The pedicle was secured to epicardium  after it was tested and found to have good flow of bleeding.  The  pedicle bulldog was then replaced.  Cardioplegia redosed.   Attention was then directed to the aortic valve replacement.  A  transverse aortotomy was performed.  The aortic valve was inspected and  was  tricuspid.  It was calcified, thickened, and stenotic.  The leaflets  were excised.  The annulus was debrided of heavy calcium.  The outflow  tract was irrigated with copious amounts of cold saline.  Subannular 2-0  pledgeted sutures were placed around the annulus numbering 14 total  sutures.  The valve annulus sized to a 23 pericardial valve which was  washed and prepared according to protocol.  The sutures were placed in  the sewing ring and the valve was seated and the sutures were tied.  There was excellent confirmation  of the valve to the annulus and no  evidence of spaces for pericardial leak.  The leaflets opened and closed  appropriately.  There was no obstruction of the coronary ostia.  The  aortotomy was closed in layers using running 4-0 Prolene in 2 layers.  The 2 proximal vein anastomoses were then performed and the area was  vented from the coronaries with a dose of retrograde warm blood  cardioplegia as the aortic crossclamp was removed.   The heart was cardioverted back to a regular rhythm.  The vein grafts  were opened and aspirated of air with a 27-gauge needle.  There was good  flow in the bypass grafts and hemostasis was documented at the proximal  and distal sites.  The mammary artery did have some bleeding from a  distal side branch which was controlled with a figure-of-eight 8-0  Prolene.  The patient was rewarmed and reperfused.  Temporary pacing  wires were applied.  After the patient had been adequately reperfused,  the lungs were expanded, and the heart was filled, and the patient  weaned from bypass on renal dose, dopamine.  There was good hemodynamics  and the echo showed good LV function with a normal prosthetic valve.  Protamine was administered without any adverse reaction.  There,  however, continued to be significant coagulopathy and platelet count was  100,000.  The patient was given platelets and FFP with improved  coagulation function.  Also, topical  FloSeal was used on the sternal  area.  Two mediastinal and a left pleural chest tube were placed and  brought through separate incisions.  The sternum was closed with  interrupted steel wire.  The  pectoralis fascia was closed with a running #1 Vicryl.  Subcutaneous and  skin layers were closed using running Vicryl and sterile dressings were  applied.  Total bypass time was 200 minutes and the patient returned to  the ICU in stable condition.      Kerin Perna, M.D.  Electronically Signed     PV/MEDQ  D:  02/11/2009  T:  02/12/2009  Job:  [id number]   cc:   Gerlene Burdock A. Alanda Amass, M.D.  Antonieta Iba, MD

## 2010-12-28 NOTE — H&P (Signed)
[name], [name]             ACCOUNT NO.:  [id number]   MEDICAL RECORD NO.:  [id number]          PATIENT TYPE:  INP   LOCATION:  1827                         FACILITY:  [facility]   PHYSICIAN:  Antonieta Iba, MD   DATE OF BIRTH:  02/23/1946   DATE OF ADMISSION:  02/09/2009  DATE OF DISCHARGE:                              HISTORY & PHYSICAL   Mr. Petz is a 65 year old white divorced male who comes to the ER  because of chest pain and shortness of breath.  He has a prior medical  history of coronary artery disease by cardiac catheterization on Jan 07, 2009, and severe AS by 2-D echo and TEE.  During the cath, they were  unable to cross AV valve, thus he underwent TEE.  He has a normal LV  function.  He has been referred to Dr. Cornelius Moras for AVR and coronary bypass  grafting.  He has not seen a doctor as of yet and his appointment is not  until mid July.  Today, he comes to the emergency room because of onset  of throat burning, flushing sensation, tachy palpitations and  presyncopal.  He went to the foot doctor today and he states he did not  think he was going to make it.  The symptoms have lasted all day.  They  are a little bit better now.  He is able to walk only a few feet before  he has the throat burning and the tachy palpitations.  His EKG in the  emergency room shows PAF with rapid ventricular response with a rate of  115.  He has not been in atrial fibrillation in the past that we know  of.  His EKG in the office on January 20, 2009, was sinus rhythm.  The  patient does say that he intermittently has these symptoms, however,  today they are worse.  Prior to today's episode, it was several weeks  ago before he had previous episodes.   PRIOR MEDICAL HISTORY:  1. Severe AS by TEE.  Mean grade 43, peak 79, AVA by planimetry was      1.1-1.2 cm2.  2. Coronary artery disease, status post cardiac catheterization on Jan 07, 2009.  RCA with 50-60%, proximal, 50% in his PLA.   OM1, he had      two lesions 80-90% and 60-70%.  LAD was okay.  3. IDDM.  4. Hypertension.  5. Hyperlipidemia.  6. Obesity.  7. COPD.  8. Prior history of CVA which was approximately 7-8 years ago.  He has      residual right hemiparesis.  He has lost fine motor coordination of      his right hand and he eats with his left arm.  He also has a right      foot drop at times.   MEDICATIONS:  1. Simvastatin 80 mg every evening.  2. Lantus 75 units b.i.d.  3. Aspirin 81 mg everyday.  4. Furosemide 20 mg 1/2-1 everyday, but he states he is actually      taking 1 everyday.  5. Gabapentin  300 mg b.i.d.  6. Glipizide 10 mg b.i.d.  7. Prilosec 20 mg everyday.  8. Azor 5/20 everyday.  9. Alprazolam 0.5 mg every 8 hours p.r.n.  10.Humalog 50/50 at 15 units b.i.d.   ALLERGIES:  NKDA.   FAMILY HISTORY:  Mother had heart and diabetes and died at age 96.  Father had emphysema and valve surgery they tell me.  He died at age 30.  He has four sisters and one brother who also had coronary artery disease  who is deceased.   SOCIAL HISTORY:  He lives with his sister.  He has four children that  are located in [place].  He is divorced.  He chews tobacco, he  quit smoking 8 years ago.  He used to smoke 2-1/2 packs per day for 45  years.  He does not drink any alcohol.  His previous job was in [employer] and in an [employer], and he also did farming.  He has been on disability since his stroke.   REVIEW OF SYSTEMS:  Positive presyncope, no syncope.  Positive tachy  palpitations.  Positive throat burning, probable anginal equivalent.  No  fever, no cough, no congestion and no hematochezia.  No black stools.  No indigestion.  Intermittent lower extremity edema.  Positive  unilateral weakness.  Other systems are negative.   PHYSICAL EXAMINATION:  VITAL SIGNS:  Blood pressure is 91/71, heart rate  71, respirations 20, temperature is 97.8.  GENERAL:  He is in no acute  distress.  He is at rest in bed with O2 on.  HEENT:  No JVD.  No carotid bruits.  Pupils equal and round.  RESPIRATIONS:  Clear to auscultation bilaterally.  CARDIOVASCULAR:  He has tachy, irregular, irregular rhythm with a 2-3/6  systolic ejection murmur.  GI:  Bowel sounds present x4.  ABDOMEN:  Protuberant and soft.  MUSCULOSKELETAL:  Moves all extremities x4.  He has weakness in his  right arm and right leg.  No lower extremity edema.  NEURO:  No focal deficits.  LOWER EXTREMITIES:  Pulses are present.  No edema.  SKIN:  He has a few bruises on his arms.   ASSESSMENT:  1. Paroxysmal atrial fibrillation with rapid ventricular response.  2. Anginal equivalent.  3. Severe AS.  4. Known coronary disease.  5. Hypertension.  6. Hyperlipidemia.  7. History of hypertension.  8. Insulin-dependent diabetes mellitus.  9. Obesity.  10.Chronic obstructive pulmonary disease.  11.History of cerebrovascular accident with residual right      hemiparesis.   PLAN:  He was seen by Dr. Julien Nordmann, who recommends placing him on  IV amiodarone, IV heparin, to admit him and contact [facility] in the morning  to evaluate for surgery, earlier than previously planned.  We will also  put him on IV heparin and follow his labs.  Rule him out for MI.      Lezlie Octave, N.P.      Antonieta Iba, MD  Electronically Signed    BB/MEDQ  D:  02/09/2009  T:  02/09/2009  Job:  [id number]   cc:   Madelin Rear. Sherwood Gambler, MD  Salvatore Decent Cornelius Moras, M.D.

## 2010-12-31 NOTE — H&P (Signed)
[facility]  Patient:    [name], [name] Visit Number: [id number] MRN: [id number]          Service Type: MED Location: 2A A211 01 Attending Physician:  Herbert Seta Dictated by:   Beryle Beams, M.D. Admit Date:  09/10/2001                           History and Physical  REASON FOR ADMISSION:  Acute stroke.  HISTORY:  This is a 65 year old right-handed Caucasian gentleman who has a history of hypertension and diabetes.  There is some suggestion of noncompliance possibly due to finances.  Apparently, he woke up this morning at about 7:00 with a right hemiparesis and some dysarthria.  He subsequently presented to the emergency room for further evaluation.  He does take medication for diabetes, does not know what it is, could not recall.  Also, apparently he does take hypertensive medication but apparently ran out and did not get it refilled because of issues with finances.  He was not taking any form of antiplatelet agent before this infarct.  PAST MEDICAL HISTORY:  As stated above, hypertension and diabetes.  SOCIAL HISTORY:  Lives by himself.  Smokes about a pack of cigarettes per day; apparently, he has been trying to cut down and has smoked more in the past.  REVIEW OF SYSTEMS:  Apparently, he does not report having any other symptoms. Did ask him about sleeping problems due to snoring, although he could not cooperate with history.  He sleeps by himself.  PHYSICAL EXAMINATION:  GENERAL:  The physical examination shows a moderately overweight gentleman in no acute distress.  HEENT/NECK:  Short stocky neck.  He does have significant reduced retropalatal space and significant crowding of the posterior pharynx.  CARDIOVASCULAR:  Exam shows a holosystolic murmur that radiates to the neck and to the axilla on the left.  LUNGS:  He does have diffuse exterior wheezes throughout.  ABDOMEN:  Soft.  EXTREMITIES:  No edema.  NEUROLOGIC:   He is alert and oriented.  He has significant dysarthria. Language is intact.  Cognition is unremarkable.  Cranial nerve evaluation showed the pupils are equally round and reactive to light and accommodation. He does have a ptosis on the left.  Extraocular movements are intact.  Visual fields are full.  He does have a mild weakness on the right lower facial muscle.  Tongue is midline; uvula is also midline.  Shoulder shrug is slightly weak on the right side.  Motor examination shows 3/5 weakness on the right upper extremity, 4 weakness of the right lower extremity proximally, distally 5/5; the left side is 5/5 throughout.  He does have some profound drift of the right upper extremity and mild drift of the right lower extremity.  Reflexes are brisk throughout.  Toes are both upgoing.  Sensory examination is normal to light touch and temperature.  Gait was not tested.  LABORATORY AND ACCESSORY DATA:  The CT scan reportedly shows no acute process.  Laboratory evaluation:  WBC 8, hemoglobin is 15.8, platelets of 191,000. Sodium 134, potassium 3.9, chloride 102, CO2 29, glucose of 310, BUN 8, creatinine 3.9, calcium 8.5.  Liver enzymes were normal.  INR 152, PT 13.4, PTT 24.  EKG:  Normal sinus rhythm.  IMPRESSION: 1. 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 2. Hypertension. 3. Diabetes. 4. Obesity. 5. Nicotine addiction. 6. Likely ______ syndrome.  PLAN:  Plan is to admit him for  further evaluation.  He was placed on aspirin antiplatelet therapy.  He will get an extensive workup including an echocardiography, carotid Duplex Doppler, homocysteine level and also lipid profile.  We will keep his blood sugar controlled to prevent further extension.  Will control his blood pressure gradually, in fact, we may not treat it for the first two days.  He also will be placed on IV fluids.  We will have physical therapy, occupational therapy and speech evaluate patient. We will likely  get his primary care physician to help with diabetes management. Dictated by:   Beryle Beams, M.D. Attending Physician:  Herbert Seta DD:  09/10/01 TD:  09/11/01 Job: [id number]

## 2010-12-31 NOTE — H&P (Signed)
[name], [name]             ACCOUNT NO.:  [id number]   MEDICAL RECORD NO.:  [id number]          PATIENT TYPE:  AMB   LOCATION:  DAY                           FACILITY:  [facility]   PHYSICIAN:  Dalia Heading, M.D.  DATE OF BIRTH:  March 03, 1946   DATE OF ADMISSION:  DATE OF DISCHARGE:  LH                                HISTORY & PHYSICAL   CHIEF COMPLAINT:  Hematochezia.   HISTORY OF PRESENT ILLNESS:  The patient is a 65 year old white male who is  referred for evaluation and treatment of hematochezia.  He has noted blood  on the toilet paper when he wipes himself.  No abdominal pain, weight loss,  nausea, vomiting, diarrhea,, constipation or melena have been noted.  He has  never had a colonoscopy.  There is no family history of colon carcinoma.   PAST MEDICAL HISTORY:  1. Insulin-dependent diabetes mellitus.  2. Hypertension.  3. High cholesterol levels.   PAST SURGICAL HISTORY:  Unremarkable.   CURRENT MEDICATIONS:  Insulin, a blood pressure pill and cholesterol pill.   ALLERGIES:  NO KNOWN DRUG ALLERGIES.   REVIEW OF SYSTEMS:  Noncontributory.   PHYSICAL EXAMINATION:  GENERAL APPEARANCE:  The patient is a well-developed,  well-nourished white male in no acute distress.  LUNGS:  Clear to auscultation with equal breath sounds bilaterally.  HEART:  Regular rate and rhythm without S3, S4, or murmurs.  ABDOMEN:  Soft, nontender, nondistended.  No hepatosplenomegaly or masses  noted.  RECTAL:  Examination was deferred to the procedure.   IMPRESSION:  Hematochezia.   PLAN:  The patient is scheduled for colonoscopy on May 04, 2006.  Risks and benefits of the procedure including bleeding and perforation were  fully explained to the patient, gave informed consent.      Dalia Heading, M.D.  Electronically Signed     MAJ/MEDQ  D:  04/27/2006  T:  04/27/2006  Job:  [id number]   cc:   Patrica Duel, M.D.  Fax: [phone]

## 2010-12-31 NOTE — Discharge Summary (Signed)
[place]  Patient:    [name], [name] Visit Number: [id number] MRN: [id number]          Service Type: [facility] Location: 4000 [id number] Attending Physician:  [name] Dictated by:   [name], P.A. Admit Date:  09/13/2001 Disc. Date: 10/10/01   CC:         [name], M.D.  [name], M.D., [place]   Discharge Summary  DISCHARGE DIAGNOSES: 1. 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 2. Hypertension. 3. Non-insulin-dependent diabetes mellitus. 4. Tobacco abuse. 5. Hyperlipidemia.  HISTORY OF PRESENT ILLNESS:  A 65 year old white male with history of hypertension, tobacco use, diabetes mellitus, and poor compliance who was admitted on January 27 with acute right-sided weakness and slurred speech. There was no chest pain  or shortness of breath.  Upon evaluation, a cranial CT scan was negative, carotid Duplex without significant stenosis, echocardiogram negative, placed on aspirin therapy.  Blood sugars with increased variables, placed on combination of Amaryl and Glucophage. A Nicoderm patch was initiated for his history of tobacco abuse.  He was moderate assist for his transfers, minimal assist bed mobility, latest chemistries were unremarkable, EKG normal sinus rhythm.  He was admitted for comprehensive rehabilitation program.  PAST MEDICAL HISTORY:  As noted above, hypertension, diabetes mellitus, and tobacco abuse with poor compliance.  ALLERGIES:  No known drug allergies.  He had taken some blood pressure medications and diabetic medications during the past.  He said he quite taking these due to expense of the medications.  SOCIAL HISTORY:  He is divorced x2.  Independent prior to admission.  Lives in mobile home with three steps to entry.  Works for a [employer], commission[employer]. He has a local son, neice, sister who can assist, but not during the night.  HOSPITAL COURSE:  The patient with  progressive gains while on rehabilitation services with therapies initiated on a b.i.d. basis.  The following issues were following during the patients rehabilitation course.  Pertaining to [name] cerebrovascular accident, his right-sided weakness continued to improve.  He is now ambulating with a quad cane.  He showed no unsafe behavior.  He was fitted with a left lower extremity AFO by [name].  He continued on aspirin therapy for CVA prophylaxis.  His blood pressures were controlled without the use of antihypertensive medications.  Blood sugars had some increased variables now on amarylx and Glucophage.  He was noted poor compliance in the past.  Full diabetic teaching was completed. All issues were discussed with family.  He would need follow up with his primary M.D. for ongoing management of his diabetes mellitus.  Again with long history of tobacco abuse, he was placed on Nicoderm patch and tapered.  There was questionable compliance at the time of discharge and he would remian off of any nicotine products.  He was placed on Zocor for hyperlipidemia. He had no bowel or bladder disturbances.  Overall for his functional mobility, he was modified independence with AFO brace in place, ambulating household distances.  Modified independence for wheelchair mobility as well as activities of daily living and functional transfers.  Family would help to provide the necessary supervision at home. He would follow up with [name], M.D. of rehabilitation services.  Latest labs showed urinalysis study showing no growth.  Hemoglobin 16.3, hematocrit 45.5, sodium 138, potassium 4.2, BUN 12, creatinine 0.9.  DISCHARGE MEDICATIONS: 1. Ecotrin 325 mg daily. 2. Zocor 20 mg daily. 3. Amaryl 2  mg three tablets daily. 4. Glucophage 500 mg twice daily. 5. Nicoderm patch 14 mg and taper accordingly.  ACTIVITY:  As tolerated.  DIET:  No concentrated sweets.  DISCHARGE INSTRUCTIONS:  No  driving, ongoing outpatient therapies as advised. The patient should continue to check blood sugars as advised with strong advisement to follow up with his primary M.D.  He would see [name], M.D. back at rehabilitation services at [phone] in four weeks. Dictated by:   [name], P.A. Attending Physician:  [name] DD:  10/09/01 TD:  10/09/01 Job: [id number]

## 2010-12-31 NOTE — Consult Note (Signed)
   NAMETAISHAWN, SMALDONE                         ACCOUNT NO.:  [id number]   MEDICAL RECORD NO.:  [id number]                   PATIENT TYPE:   LOCATION:                                       FACILITY:   PHYSICIAN:  Marlane Hatcher, M.D.           DATE OF BIRTH:   DATE OF CONSULTATION:  03/28/2002  DATE OF DISCHARGE:                           GENERAL SURGERY CONSULTATION   Thank you kindly for sending the patient my way.  As you know, he had an I&D  of a left neck sebaceous cyst and was treated on antibiotics in your office.  He has come to my office, as he still has a retained epidermal inclusion  cyst with no apparent cellulitis.  I performed an excision with a primary  closure under local anesthesia and I placed him on Cipro, as he is a  diabetic and I do not know how well he cleans himself in this area.   At any rate, I will make sure you get a copy of the pathology report for  your records and I thank you kindly for your confidence in sending him my  way.                                               Marlane Hatcher, M.D.    WSB/MEDQ  D:  03/28/2002  T:  04/01/2002  Job:  [id number]

## 2010-12-31 NOTE — Op Note (Signed)
NAME:  [name], [name]                       ACCOUNT NO.:  [id number]   MEDICAL RECORD NO.:  [id number]                   PATIENT TYPE:  AMB   LOCATION:  DAY                                  FACILITY:  [facility]   PHYSICIAN:  Dennie Maizes, M.D.                DATE OF BIRTH:  25-Jul-1946   DATE OF PROCEDURE:  03/11/2004  DATE OF DISCHARGE:                                 OPERATIVE REPORT   PREOPERATIVE DIAGNOSES:  1. Large bilateral hydroceles.  2. Condyloma of the scrotum.   POSTOPERATIVE DIAGNOSES:  1. Large bilateral hydroceles.  2. Condyloma of the scrotum.   PROCEDURE:  1. Bilateral hydrocelectomy.  2. Excision of condyloma of scrotum (1 cm).   ANESTHESIA:  Spinal.   SURGEON:  Dennie Maizes, M.D.   ESTIMATED BLOOD LOSS:  Minimal.   DRAINS:  None.   INDICATIONS FOR PROCEDURE:  This 65 year old male had large bilateral  hydroceles and a small scrotal condyloma. He was taken to the OR today for  bilateral hydrocelectomy and excision of the scrotal condyloma.   DESCRIPTION OF PROCEDURE:  Spinal anesthesia was induced and the patient was  placed on the OR table in the supine position.  The lower abdomen and  genitalia were prepped and draped in a sterile fashion.  The 1 cm size  condyloma over the base of the scrotum, on the left side, was excised first.  The base was then fulgurated.   A transverse scrotal incision was made over the anterior scrotal wall. The  layers of the scrotal wall were divided and the left hydrocele sac was first  identified.  By sharp and blunt dissection the hydrocele sac was separated  from the scrotal wall and delivered through the scrotal wound.  An incision  was made in the tunica and about 300 cc of clear fluid was drained.  The  testis and epidermis were normal.  The scrotal sac was then reversed behind  the testis and spermatic cord structures. The edges of the tunica were then  approximated using 3-0 Vicryl locking sutures.  A similar  procedure was done  on the right side.   The right testis and left testis was then replaced in the scrotal cavity.  Hemostasis was obtained by cauterization.  The scrotal wall incision was  then closed in 2 layers using 3-0 chromic gut.  The scrotal dressing and  scrotal support were applied.  The estimated blood loss was minimal.  The  sponges and instruments were correct x2 at the time of closure.  The patient  was transferred to the PACU in satisfactory condition.      ___________________________________________                                            Dennie Maizes, M.D.   SK/MEDQ  D:  03/11/2004  T:  03/11/2004  Job:  [id number]   cc:   Patrica Duel, M.D.  [address], [address]  [place]  [place] [address]  Fax: [phone]

## 2010-12-31 NOTE — Discharge Summary (Signed)
[facility]  Patient:    [name], [name] Visit Number: [id number] MRN: [id number]          Service Type: [facility] Location: 4000 [id number] Attending Physician:  Herold Harms Dictated by:   Mcarthur Rossetti. Angiulli, P.A. Admit Date:  09/13/2001 Discharge Date: 10/09/2001                             Discharge Summary  ADDENDUM:  Discharge date was initially scheduled for October 10, 2001.  Due to oncoming winter weather, it was felt the patient could be discharged the evening of October 09, 2001.  The remaining hospital course remained unchanged. Dictated by:   Mcarthur Rossetti. Angiulli, P.A. Attending Physician:  Herold Harms DD:  10/09/01 TD:  10/09/01 Job: [id number]

## 2010-12-31 NOTE — Discharge Summary (Signed)
[facility]  Patient:    [name], [name] Visit Number: [id number] MRN: [id number]          Service Type: MED Location: 2A A211 01 Attending Physician:  Herbert Seta Dictated by:   Beryle Beams, M.D. Admit Date:  09/10/2001                             Discharge Summary  ADMISSION DIAGNOSIS:  Left hemispheric infarct.  FINAL DIAGNOSES: 1. Left hemispheric lacunar infarct. 2. Hypertension. 3. Diabetes. 4. Chronic obstructive pulmonary disease with acute exacerbation. 5. Nicotine addiction. 6. Hypercholesterolemia.  HOSPITAL COURSE:  The patient was admitted and worked up for a pure motor right hemiparesis.  Had mild dysarthria.  No aphasia.  Echocardiography was normal.  Carotid duplex Doppler was also unremarkable.  Cholesterol was high at 211.  The LDL was concerning as it was 142.  The patient did seem to get a little worse while in the hospital; however, he stabilized after an apparent mild initial worsening.  His lower extremity was 3-, upper extremity 2.  Did have a bout of acute shortness of breath, was felt to be due to asthma or reactive airway disease.  Responded well to bronchodilators.  D-dimers were negative. Blood gases were unremarkable after treatment.  Otherwise had an uneventful course and was discharged to rehabilitation.  MEDICATIONS: 1. Aspirin. 2. Simvastatin 200 mg once a day. 3. Glucophage 500 mg b.i.d. 4. Amaryl 4 mg once a day. Dictated by:   Beryle Beams, M.D. Attending Physician:  Herbert Seta DD:  09/13/01 TD:  09/13/01 Job: [id number]

## 2011-02-02 ENCOUNTER — Other Ambulatory Visit (HOSPITAL_COMMUNITY): Payer: Self-pay | Admitting: Endocrinology

## 2011-02-02 DIAGNOSIS — E049 Nontoxic goiter, unspecified: Secondary | ICD-10-CM

## 2011-03-21 ENCOUNTER — Emergency Department (HOSPITAL_COMMUNITY): Payer: Medicare Other

## 2011-03-21 ENCOUNTER — Emergency Department (HOSPITAL_COMMUNITY)
Admission: EM | Admit: 2011-03-21 | Discharge: 2011-03-21 | Disposition: A | Discharge status: Home / Self Care | Payer: Medicare Other | Attending: Emergency Medicine | Admitting: Emergency Medicine

## 2011-03-21 DIAGNOSIS — I10 Essential (primary) hypertension: Secondary | ICD-10-CM | POA: Insufficient documentation

## 2011-03-21 DIAGNOSIS — S40019A Contusion of unspecified shoulder, initial encounter: Secondary | ICD-10-CM

## 2011-03-21 DIAGNOSIS — IMO0002 Reserved for concepts with insufficient information to code with codable children: Secondary | ICD-10-CM | POA: Insufficient documentation

## 2011-03-21 DIAGNOSIS — S50319A Abrasion of unspecified elbow, initial encounter: Secondary | ICD-10-CM

## 2011-03-21 DIAGNOSIS — E119 Type 2 diabetes mellitus without complications: Secondary | ICD-10-CM | POA: Insufficient documentation

## 2011-03-21 DIAGNOSIS — S20219A Contusion of unspecified front wall of thorax, initial encounter: Secondary | ICD-10-CM | POA: Insufficient documentation

## 2011-03-21 DIAGNOSIS — Z8679 Personal history of other diseases of the circulatory system: Secondary | ICD-10-CM | POA: Insufficient documentation

## 2011-03-21 DIAGNOSIS — W19XXXA Unspecified fall, initial encounter: Secondary | ICD-10-CM | POA: Insufficient documentation

## 2011-03-21 DIAGNOSIS — Y92009 Unspecified place in unspecified non-institutional (private) residence as the place of occurrence of the external cause: Secondary | ICD-10-CM | POA: Insufficient documentation

## 2011-03-21 HISTORY — DX: Presence of prosthetic heart valve: Z95.2

## 2011-03-21 HISTORY — DX: Disorder of thyroid, unspecified: E07.9

## 2011-03-21 HISTORY — DX: Cerebral infarction, unspecified: I63.9

## 2011-03-21 HISTORY — DX: Essential (primary) hypertension: I10

## 2011-03-21 MED ORDER — HYDROCODONE-ACETAMINOPHEN 5-325 MG PO TABS: ORAL_TABLET | ORAL | Status: DC

## 2011-03-21 MED ORDER — HYDROCODONE-ACETAMINOPHEN 5-325 MG PO TABS
1.0000 | ORAL_TABLET | Freq: Once | ORAL | Status: AC
  Administered 2011-03-21: 1 via ORAL
  Filled 2011-03-21: qty 1

## 2011-03-21 MED ORDER — TETANUS-DIPHTH-ACELL PERTUSSIS 5-2.5-18.5 LF-MCG/0.5 IM SUSP
0.5000 mL | Freq: Once | INTRAMUSCULAR | Status: AC
  Administered 2011-03-21: 0.5 mL via INTRAMUSCULAR
  Filled 2011-03-21: qty 0.5

## 2011-03-21 NOTE — ED Provider Notes (Signed)
History     CSN: [id number] Arrival date & time: 03/21/2011  9:13 AM  Chief Complaint  Patient presents with  . Fall    right arm/knee pain   Patient is a 65 y.o. male presenting with fall. The history is provided by the patient. No language interpreter was used.  Fall The accident occurred 1 to 2 hours ago. The fall occurred while walking. Distance fallen: from standing. Impact surface: fell in gravel.  was getting ready to mow his lawn. The point of impact was the right shoulder. The pain is present in the right shoulder (R upper and lateral ribs.). The pain is severe. He was ambulatory at the scene. There was no entrapment after the fall. There was no drug use involved in the accident. There was no alcohol use involved in the accident. Pertinent negatives include no loss of consciousness. He has tried nothing for the symptoms.    Past Medical History  Diagnosis Date  . Hypertension   . Diabetes mellitus   . History of valve replacement   . Thyroid disease   . Stroke     History reviewed. No pertinent past surgical history.  History reviewed. No pertinent family history.  History  Substance Use Topics  . Smoking status: Former Games developer  . Smokeless tobacco: Not on file  . Alcohol Use: No      Review of Systems  Cardiovascular: Positive for chest pain.       R upper anterior chest and lateral chest.  Musculoskeletal:       Shoulder pain  Skin:       R elbow abrasions  Neurological: Negative for loss of consciousness.  All other systems reviewed and are negative.    Physical Exam  BP 146/68  Pulse 84  Temp(Src) 97.5 F (36.4 C) (Oral)  Resp 16  Ht 5\' 10"  (1.778 m)  Wt 240 lb (108.863 kg)  BMI 34.44 kg/m2  SpO2 95%  Physical Exam  Nursing note and vitals reviewed. Constitutional: He is oriented to person, place, and time. Vital signs are normal. He appears well-developed and well-nourished. No distress.  HENT:  Head: Normocephalic and atraumatic.  Right  Ear: External ear normal.  Left Ear: External ear normal.  Nose: Nose normal.  Mouth/Throat: No oropharyngeal exudate.  Eyes: Conjunctivae and EOM are normal. Pupils are equal, round, and reactive to light. Right eye exhibits no discharge. Left eye exhibits no discharge. No scleral icterus.  Neck: Normal range of motion. Neck supple. No JVD present. No tracheal deviation present. No thyromegaly present.  Cardiovascular: Normal rate, regular rhythm, normal heart sounds, intact distal pulses and normal pulses.  Exam reveals no gallop and no friction rub.   No murmur heard. Pulmonary/Chest: Effort normal and breath sounds normal. No stridor. No respiratory distress. He has no wheezes. He has no rales. He exhibits no tenderness.  Abdominal: Soft. Normal appearance and bowel sounds are normal. He exhibits no distension and no mass. There is no tenderness. There is no rebound and no guarding.  Musculoskeletal: He exhibits tenderness. He exhibits no edema.       Right shoulder: He exhibits decreased range of motion, tenderness, bony tenderness and pain. He exhibits no deformity and normal pulse.       Arms: Lymphadenopathy:    He has no cervical adenopathy.  Neurological: He is alert and oriented to person, place, and time. He has normal reflexes. No cranial nerve deficit. Coordination normal. GCS eye subscore is 4. GCS verbal subscore  is 5. GCS motor subscore is 6.  Reflex Scores:      Tricep reflexes are 2+ on the right side and 2+ on the left side.      Bicep reflexes are 2+ on the right side and 2+ on the left side.      Brachioradialis reflexes are 2+ on the right side and 2+ on the left side.      Patellar reflexes are 2+ on the right side and 2+ on the left side.      Achilles reflexes are 2+ on the right side and 2+ on the left side. Skin: Skin is warm and dry. No rash noted. He is not diaphoretic.  Psychiatric: He has a normal mood and affect. His speech is normal and behavior is normal.  Judgment and thought content normal. Cognition and memory are normal.    ED Course  Procedures  MDM       Worthy Rancher, PA 03/21/11 1115

## 2011-03-21 NOTE — ED Provider Notes (Signed)
Medical screening examination/treatment/procedure(s) were performed by non-physician practitioner and as supervising physician I was immediately available for consultation/collaboration.   Jamesa Tedrick M Syrah Daughtrey, DO 03/21/11 1721 

## 2011-03-21 NOTE — ED Notes (Signed)
Pt states he was outside and fell from standing after he tangled his right leg (affected by a stroke) and mis-stepped.  Pt c/o right chest, right upper extremity, right knee pain.  Pt denies LOC.  States he fell on gravel.

## 2011-03-28 ENCOUNTER — Encounter (HOSPITAL_COMMUNITY): Payer: Self-pay

## 2011-03-30 ENCOUNTER — Other Ambulatory Visit (HOSPITAL_COMMUNITY): Payer: Self-pay | Admitting: Internal Medicine

## 2011-04-01 ENCOUNTER — Ambulatory Visit (HOSPITAL_COMMUNITY)
Admission: RE | Admit: 2011-04-01 | Discharge: 2011-04-01 | Disposition: A | Discharge status: Home / Self Care | Payer: Medicare Other | Source: Ambulatory Visit | Attending: Internal Medicine | Admitting: Internal Medicine

## 2011-04-01 DIAGNOSIS — M25519 Pain in unspecified shoulder: Secondary | ICD-10-CM | POA: Insufficient documentation

## 2011-04-01 DIAGNOSIS — S42009A Fracture of unspecified part of unspecified clavicle, initial encounter for closed fracture: Secondary | ICD-10-CM | POA: Insufficient documentation

## 2011-04-01 DIAGNOSIS — S42309A Unspecified fracture of shaft of humerus, unspecified arm, initial encounter for closed fracture: Secondary | ICD-10-CM | POA: Insufficient documentation

## 2011-04-01 DIAGNOSIS — M25529 Pain in unspecified elbow: Secondary | ICD-10-CM | POA: Insufficient documentation

## 2011-04-01 DIAGNOSIS — W19XXXA Unspecified fall, initial encounter: Secondary | ICD-10-CM | POA: Insufficient documentation

## 2011-04-19 ENCOUNTER — Ambulatory Visit (HOSPITAL_COMMUNITY): Payer: Medicare Other

## 2011-04-28 ENCOUNTER — Ambulatory Visit (HOSPITAL_COMMUNITY)
Admission: RE | Admit: 2011-04-28 | Discharge: 2011-04-28 | Disposition: A | Discharge status: Home / Self Care | Payer: Medicare Other | Source: Ambulatory Visit | Attending: Endocrinology | Admitting: Endocrinology

## 2011-04-28 DIAGNOSIS — E042 Nontoxic multinodular goiter: Secondary | ICD-10-CM | POA: Insufficient documentation

## 2011-04-28 DIAGNOSIS — E049 Nontoxic goiter, unspecified: Secondary | ICD-10-CM

## 2011-09-16 HISTORY — PX: TRANSTHORACIC ECHOCARDIOGRAM: SHX275

## 2011-10-04 ENCOUNTER — Ambulatory Visit (HOSPITAL_COMMUNITY)
Admission: RE | Admit: 2011-10-04 | Discharge: 2011-10-04 | Disposition: A | Discharge status: Home / Self Care | Payer: Medicare Other | Source: Ambulatory Visit | Attending: Internal Medicine | Admitting: Internal Medicine

## 2011-10-04 DIAGNOSIS — R0609 Other forms of dyspnea: Secondary | ICD-10-CM | POA: Insufficient documentation

## 2011-10-04 DIAGNOSIS — R0989 Other specified symptoms and signs involving the circulatory and respiratory systems: Secondary | ICD-10-CM | POA: Insufficient documentation

## 2011-10-04 NOTE — Progress Notes (Signed)
*  PRELIMINARY RESULTS* Echocardiogram 2D Echocardiogram has been performed.  [name] 10/04/2011, 9:21 AM

## 2011-11-29 ENCOUNTER — Other Ambulatory Visit (HOSPITAL_COMMUNITY): Payer: Self-pay | Admitting: Endocrinology

## 2011-11-29 DIAGNOSIS — E049 Nontoxic goiter, unspecified: Secondary | ICD-10-CM

## 2011-12-02 ENCOUNTER — Ambulatory Visit (HOSPITAL_COMMUNITY): Payer: Medicare Other

## 2011-12-05 ENCOUNTER — Ambulatory Visit (HOSPITAL_COMMUNITY)
Admission: RE | Admit: 2011-12-05 | Discharge: 2011-12-05 | Disposition: A | Discharge status: Home / Self Care | Payer: Medicare Other | Source: Ambulatory Visit | Attending: Endocrinology | Admitting: Endocrinology

## 2011-12-05 DIAGNOSIS — E049 Nontoxic goiter, unspecified: Secondary | ICD-10-CM | POA: Insufficient documentation

## 2012-04-03 ENCOUNTER — Other Ambulatory Visit (HOSPITAL_COMMUNITY): Payer: Self-pay | Admitting: Endocrinology

## 2012-04-03 DIAGNOSIS — E049 Nontoxic goiter, unspecified: Secondary | ICD-10-CM

## 2012-04-03 DIAGNOSIS — R221 Localized swelling, mass and lump, neck: Secondary | ICD-10-CM

## 2012-06-01 ENCOUNTER — Other Ambulatory Visit (HOSPITAL_COMMUNITY): Payer: Self-pay

## 2012-06-01 ENCOUNTER — Ambulatory Visit (HOSPITAL_COMMUNITY)
Admission: RE | Admit: 2012-06-01 | Discharge: 2012-06-01 | Disposition: A | Discharge status: Home / Self Care | Payer: Medicare Other | Source: Ambulatory Visit | Attending: Endocrinology | Admitting: Endocrinology

## 2012-06-01 DIAGNOSIS — E049 Nontoxic goiter, unspecified: Secondary | ICD-10-CM | POA: Insufficient documentation

## 2012-06-14 ENCOUNTER — Ambulatory Visit (INDEPENDENT_AMBULATORY_CARE_PROVIDER_SITE_OTHER): Payer: Medicare Other | Admitting: Otolaryngology

## 2012-06-14 DIAGNOSIS — H9319 Tinnitus, unspecified ear: Secondary | ICD-10-CM

## 2012-06-14 DIAGNOSIS — H903 Sensorineural hearing loss, bilateral: Secondary | ICD-10-CM

## 2012-06-15 ENCOUNTER — Other Ambulatory Visit (INDEPENDENT_AMBULATORY_CARE_PROVIDER_SITE_OTHER): Payer: Self-pay | Admitting: Otolaryngology

## 2012-06-15 DIAGNOSIS — H93A9 Pulsatile tinnitus, unspecified ear: Secondary | ICD-10-CM

## 2012-06-19 ENCOUNTER — Ambulatory Visit (HOSPITAL_COMMUNITY)
Admission: RE | Admit: 2012-06-19 | Discharge: 2012-06-19 | Disposition: A | Discharge status: Home / Self Care | Payer: Medicare Other | Source: Ambulatory Visit | Attending: Otolaryngology | Admitting: Otolaryngology

## 2012-06-19 ENCOUNTER — Other Ambulatory Visit (HOSPITAL_COMMUNITY): Payer: Medicare Other

## 2012-06-19 ENCOUNTER — Other Ambulatory Visit (INDEPENDENT_AMBULATORY_CARE_PROVIDER_SITE_OTHER): Payer: Self-pay | Admitting: Otolaryngology

## 2012-06-19 DIAGNOSIS — H9319 Tinnitus, unspecified ear: Secondary | ICD-10-CM | POA: Insufficient documentation

## 2012-06-19 DIAGNOSIS — F40298 Other specified phobia: Secondary | ICD-10-CM | POA: Insufficient documentation

## 2012-06-19 DIAGNOSIS — H93A9 Pulsatile tinnitus, unspecified ear: Secondary | ICD-10-CM

## 2012-06-20 ENCOUNTER — Other Ambulatory Visit (INDEPENDENT_AMBULATORY_CARE_PROVIDER_SITE_OTHER): Payer: Self-pay | Admitting: Otolaryngology

## 2012-06-20 DIAGNOSIS — H93A9 Pulsatile tinnitus, unspecified ear: Secondary | ICD-10-CM

## 2012-06-20 LAB — POCT I-STAT, CHEM 8
BUN: 20 mg/dL (ref 6–23)
Chloride: 101 mEq/L (ref 96–112)
Glucose, Bld: 138 mg/dL — ABNORMAL HIGH (ref 70–99)
HCT: 41 % (ref 39.0–52.0)
Potassium: 4.1 mEq/L (ref 3.5–5.1)

## 2012-06-26 ENCOUNTER — Other Ambulatory Visit: Payer: Medicare Other

## 2012-06-26 ENCOUNTER — Ambulatory Visit
Admission: RE | Admit: 2012-06-26 | Discharge: 2012-06-26 | Disposition: A | Discharge status: Home / Self Care | Payer: Medicare Other | Source: Ambulatory Visit | Attending: Otolaryngology | Admitting: Otolaryngology

## 2012-06-26 ENCOUNTER — Inpatient Hospital Stay: Admission: RE | Admit: 2012-06-26 | Payer: Medicare Other | Source: Ambulatory Visit

## 2012-06-26 DIAGNOSIS — H93A9 Pulsatile tinnitus, unspecified ear: Secondary | ICD-10-CM

## 2012-06-26 MED ORDER — GADOBENATE DIMEGLUMINE 529 MG/ML IV SOLN
20.0000 mL | Freq: Once | INTRAVENOUS | Status: AC | PRN
  Administered 2012-06-26: 20 mL via INTRAVENOUS

## 2012-06-27 ENCOUNTER — Other Ambulatory Visit (INDEPENDENT_AMBULATORY_CARE_PROVIDER_SITE_OTHER): Payer: Self-pay | Admitting: Otolaryngology

## 2012-06-27 DIAGNOSIS — H905 Unspecified sensorineural hearing loss: Secondary | ICD-10-CM

## 2012-07-02 ENCOUNTER — Other Ambulatory Visit (INDEPENDENT_AMBULATORY_CARE_PROVIDER_SITE_OTHER): Payer: Self-pay | Admitting: Otolaryngology

## 2012-07-02 DIAGNOSIS — H9190 Unspecified hearing loss, unspecified ear: Secondary | ICD-10-CM

## 2012-07-03 ENCOUNTER — Ambulatory Visit (HOSPITAL_COMMUNITY)
Admission: RE | Admit: 2012-07-03 | Discharge: 2012-07-03 | Disposition: A | Discharge status: Home / Self Care | Payer: Medicare Other | Source: Ambulatory Visit | Attending: Otolaryngology | Admitting: Otolaryngology

## 2012-07-03 DIAGNOSIS — H9190 Unspecified hearing loss, unspecified ear: Secondary | ICD-10-CM

## 2012-12-12 ENCOUNTER — Other Ambulatory Visit (HOSPITAL_COMMUNITY): Payer: Self-pay | Admitting: Endocrinology

## 2012-12-12 DIAGNOSIS — E049 Nontoxic goiter, unspecified: Secondary | ICD-10-CM

## 2013-02-07 ENCOUNTER — Ambulatory Visit (INDEPENDENT_AMBULATORY_CARE_PROVIDER_SITE_OTHER): Payer: Medicare Other | Admitting: Otolaryngology

## 2013-04-05 ENCOUNTER — Telehealth: Payer: Self-pay | Admitting: Internal Medicine

## 2013-04-05 NOTE — Telephone Encounter (Signed)
Returned call to Nicole Cella, pt's sister.  Stated pt c/o bad burning in his chest and throat.  Stated this has been going on for a while and pt starts sweating real bad.  Stated pt is real ill and won't call.  Informed Dorothy that ROI not on file for her and RN will call pt.  Verbalized understanding.  Call to pt and confirmed he has a sister, ok to talk to.  Pt c/o burning in chest that is relieved after taking Rolaids and belching.  Denied CP or SOB today.  Informed per last OV note, he was to see Dr. [name] 6 mo to 1 year and pt stated he wanted to come in on Monday.  Dr. [name] had openings and appt scheduled for Mon, Aug. 25th at 2:45pm.   Advised ER for chest pain/pressure, SOB.  Pt verbalized understanding and agreed w/ plan.

## 2013-04-05 NOTE — Telephone Encounter (Signed)
Pt was advised to see PCP r/t indigestion as he stated he thinks that's what it is.  Pt did verbalize understanding.

## 2013-04-05 NOTE — Telephone Encounter (Signed)
Please call-seems like something is going on with him-she scared he is going to have a hear attack.

## 2013-04-08 ENCOUNTER — Ambulatory Visit (INDEPENDENT_AMBULATORY_CARE_PROVIDER_SITE_OTHER): Payer: Medicare Other | Admitting: Internal Medicine

## 2013-04-08 ENCOUNTER — Encounter: Payer: Self-pay | Admitting: Internal Medicine

## 2013-04-08 VITALS — BP 106/72 | HR 88 | Ht 70.5 in | Wt 249.8 lb

## 2013-04-08 DIAGNOSIS — Z952 Presence of prosthetic heart valve: Secondary | ICD-10-CM | POA: Insufficient documentation

## 2013-04-08 DIAGNOSIS — I2510 Atherosclerotic heart disease of native coronary artery without angina pectoris: Secondary | ICD-10-CM

## 2013-04-08 DIAGNOSIS — K219 Gastro-esophageal reflux disease without esophagitis: Secondary | ICD-10-CM

## 2013-04-08 DIAGNOSIS — E119 Type 2 diabetes mellitus without complications: Secondary | ICD-10-CM

## 2013-04-08 DIAGNOSIS — Z954 Presence of other heart-valve replacement: Secondary | ICD-10-CM

## 2013-04-08 DIAGNOSIS — Z951 Presence of aortocoronary bypass graft: Secondary | ICD-10-CM

## 2013-04-08 DIAGNOSIS — E785 Hyperlipidemia, unspecified: Secondary | ICD-10-CM

## 2013-04-08 DIAGNOSIS — G8190 Hemiplegia, unspecified affecting unspecified side: Secondary | ICD-10-CM

## 2013-04-08 DIAGNOSIS — G8191 Hemiplegia, unspecified affecting right dominant side: Secondary | ICD-10-CM

## 2013-04-08 DIAGNOSIS — I639 Cerebral infarction, unspecified: Secondary | ICD-10-CM | POA: Insufficient documentation

## 2013-04-08 DIAGNOSIS — I6350 Cerebral infarction due to unspecified occlusion or stenosis of unspecified cerebral artery: Secondary | ICD-10-CM

## 2013-04-08 DIAGNOSIS — H269 Unspecified cataract: Secondary | ICD-10-CM

## 2013-04-08 NOTE — Progress Notes (Signed)
OFFICE NOTE  Chief Complaint:  Routine followup  Primary Care Physician: Cassell Smiles., MD  HPI:  Nonah Mattes a pleasant 67 year old gentleman with history of coronary artery disease status post CABG x3 vessels with Dr. Donata [name] with a LIMA to the LAD, SVG to circumflex and SVG to PDA. He also had an [facility] Ease bioprosthetic 23-mm valve placed at that time in 2010. In 2003, unfortunately, he had a stroke which has caused much difficulty walking as well as some heaviness and some swelling in the right upper extremity particularly. Unfortunately, he has not been able to lose weight. He is active walking but does not necessarily do any exercise. In February 2013 he underwent another echocardiogram to look at his valve gradient. It was increased significantly compared to his prior study. The peak gradient was 26, mean gradient of 14. However, he is not complaining of any worsening dyspnea or anything associated with that. He is describing some symptoms which are concerning for worsening acid reflux including belching and increased heartburn. At his last office visit I increased his protime is to twice daily. This is improved his reflux some, but he still has symptoms associated with some belching and/or flatus which improves. He says that Maisie Fus and/or Prevacid actually helped some of his symptoms. He denies any chest pain.  PMHx:  Past Medical History  Diagnosis Date  . Hypertension   . Diabetes mellitus     type 2   . History of valve replacement   . Thyroid disease   . Stroke 2003    R-sided weakness & upper extremity swelling   . Coronary artery disease   . Dyslipidemia     Past Surgical History  Procedure Laterality Date  . Coronary artery bypass graft  01/26/2009    LIMA to LAD, SVG to circumflex, SVG to PDA  . Aortic valve replacement  01/26/2009    [facility] Ease bioprosthetic 23mm valve  . Transthoracic echocardiogram  09/2011    grade 1 diastolic  dysfunction; increasing valve gradient     FAMHx:  Family History  Problem Relation Age of Onset  . Heart attack Mother   . Liver disease Brother   . Diabetes Sister     x4    SOCHx:   reports that he quit smoking about 11 years ago. His smoking use included Cigarettes. He smoked 0.00 packs per day. He has quit using smokeless tobacco. His smokeless tobacco use included Chew. He reports that he does not drink alcohol or use illicit drugs.  ALLERGIES:  No Known Allergies  ROS: A comprehensive review of systems was negative except for: Gastrointestinal: positive for dyspepsia and reflux symptoms  HOME MEDS: Current Outpatient Prescriptions  Medication Sig Dispense Refill  . acetaminophen (TYLENOL) 500 MG tablet Take 1,000 mg by mouth every 6 (six) hours as needed. For pain/headaches       . ALPRAZolam (XANAX) 1 MG tablet Take 1 mg by mouth 4 (four) times daily as needed. For anxiety- take twice daily      . aspirin 81 MG EC tablet Take 81 mg by mouth daily.        . carvedilol (COREG) 3.125 MG tablet Take 3.125 mg by mouth 2 (two) times daily.        Marland Kitchen docusate sodium (COLACE) 100 MG capsule Take 100 mg by mouth 2 (two) times daily.        . fenofibrate (TRICOR) 145 MG tablet Take 145 mg by mouth daily.      Marland Kitchen  fish oil-omega-3 fatty acids 1000 MG capsule Take 2 g by mouth daily.        Marland Kitchen gabapentin (NEURONTIN) 300 MG capsule Take 300 mg by mouth 3 (three) times daily.        . insulin glargine (LANTUS) 100 UNIT/ML injection Inject 60-75 Units into the skin 2 (two) times daily. Takes75 units in the morning and 60 units in the evening      . insulin lispro (HUMALOG) 100 UNIT/ML injection Inject 35-45 Units into the skin 3 (three) times daily before meals. 45U bid and 35U QD      . levothyroxine (SYNTHROID, LEVOTHROID) 25 MCG tablet Take 25 mcg by mouth daily.        Marland Kitchen lisinopril (PRINIVIL,ZESTRIL) 10 MG tablet Take 10 mg by mouth daily.        . pantoprazole (PROTONIX) 40 MG tablet  Take 40 mg by mouth 2 (two) times daily.       . simvastatin (ZOCOR) 20 MG tablet Take 40 mg by mouth at bedtime.       . torsemide (DEMADEX) 20 MG tablet Take 20 mg by mouth every other day.       No current facility-administered medications for this visit.    LABS/IMAGING: No results found for this or any previous visit (from the past 48 hour(s)). No results found.  VITALS: BP 106/72  Pulse 88  Ht 5' 10.5" (1.791 m)  Wt 249 lb 12.8 oz (113.309 kg)  BMI 35.32 kg/m2  EXAM: General appearance: alert and no distress Neck: no adenopathy, no carotid bruit, no JVD, supple, symmetrical, trachea midline and thyroid not enlarged, symmetric, no tenderness/mass/nodules Lungs: clear to auscultation bilaterally Heart: regular rate and rhythm, S1, S2 normal, 2/6 SEM at RUSB, no click, rub or gallop Abdomen: soft, non-tender; bowel sounds normal; no masses,  no organomegaly and obese Extremities: extremities normal, atraumatic, no cyanosis or edema Pulses: 2+ and symmetric Skin: Skin color, texture, turgor normal. No rashes or lesions Neurologic: Grossly normal  EKG: Normal sinus rhythm at 88, left bundle branch block, first degree AV block  ASSESSMENT: 1. Coronary artery disease status post three-vessel CABG in 2010 2. Status post bioprosthetic aVR in 2010, Edwards magnesium 23 mm 3. GERD 4. Dyslipidemia 5. Diabetes type 2 6. History of stroke with right-sided hemiparesis  PLAN: 1.   Mr. Buelow continues to have complaints of reflux symptoms. I think he would benefit from seeing a gastroenterologist. He wanted to talk with Dr. Carlena Sax first to get a referral. His aortic valve gradient sounds unchanged. We should consider repeating his echocardiogram next year.  Chrystie Nose, MD, [facility] Attending Cardiologist [facility]  HILTY,Kenneth C 04/08/2013, 3:47 PM

## 2013-04-08 NOTE — Patient Instructions (Addendum)
Your physician wants you to follow-up in: 1 year. You will receive a reminder letter in the mail two months in advance. If you don't receive a letter, please call our office to schedule the follow-up appointment.  

## 2013-04-21 ENCOUNTER — Other Ambulatory Visit: Payer: Self-pay | Admitting: Internal Medicine

## 2013-04-22 NOTE — Telephone Encounter (Signed)
Rx was sent to pharmacy electronically. 

## 2013-05-09 ENCOUNTER — Other Ambulatory Visit: Payer: Self-pay | Admitting: Internal Medicine

## 2013-05-09 NOTE — Telephone Encounter (Signed)
Rx was sent to pharmacy electronically. 

## 2013-05-15 DIAGNOSIS — I480 Paroxysmal atrial fibrillation: Secondary | ICD-10-CM

## 2013-05-15 DIAGNOSIS — Z7901 Long term (current) use of anticoagulants: Secondary | ICD-10-CM

## 2013-05-15 HISTORY — DX: Long term (current) use of anticoagulants: Z79.01

## 2013-05-15 HISTORY — DX: Paroxysmal atrial fibrillation: I48.0

## 2013-05-20 ENCOUNTER — Ambulatory Visit (HOSPITAL_COMMUNITY)
Admission: RE | Admit: 2013-05-20 | Discharge: 2013-05-20 | Disposition: A | Discharge status: Home / Self Care | Payer: Medicare Other | Source: Ambulatory Visit | Attending: Endocrinology | Admitting: Endocrinology

## 2013-05-20 ENCOUNTER — Ambulatory Visit (HOSPITAL_COMMUNITY): Payer: Medicare Other

## 2013-05-20 DIAGNOSIS — E042 Nontoxic multinodular goiter: Secondary | ICD-10-CM | POA: Insufficient documentation

## 2013-05-20 DIAGNOSIS — E049 Nontoxic goiter, unspecified: Secondary | ICD-10-CM

## 2013-05-21 ENCOUNTER — Other Ambulatory Visit: Payer: Self-pay | Admitting: Internal Medicine

## 2013-05-22 NOTE — Telephone Encounter (Signed)
Rx was sent to pharmacy electronically. 

## 2013-05-31 ENCOUNTER — Inpatient Hospital Stay (HOSPITAL_COMMUNITY)
Admission: EM | Admit: 2013-05-31 | Discharge: 2013-06-07 | DRG: 286 | Disposition: A | Discharge status: Home / Self Care | Payer: Medicare Other | Attending: Internal Medicine | Admitting: Internal Medicine

## 2013-05-31 ENCOUNTER — Emergency Department (HOSPITAL_COMMUNITY): Payer: Medicare Other

## 2013-05-31 ENCOUNTER — Encounter (HOSPITAL_COMMUNITY): Payer: Self-pay | Admitting: Emergency Medicine

## 2013-05-31 DIAGNOSIS — Z8249 Family history of ischemic heart disease and other diseases of the circulatory system: Secondary | ICD-10-CM

## 2013-05-31 DIAGNOSIS — Z87891 Personal history of nicotine dependence: Secondary | ICD-10-CM

## 2013-05-31 DIAGNOSIS — E785 Hyperlipidemia, unspecified: Secondary | ICD-10-CM | POA: Diagnosis present

## 2013-05-31 DIAGNOSIS — Z952 Presence of prosthetic heart valve: Secondary | ICD-10-CM

## 2013-05-31 DIAGNOSIS — Z7982 Long term (current) use of aspirin: Secondary | ICD-10-CM

## 2013-05-31 DIAGNOSIS — E039 Hypothyroidism, unspecified: Secondary | ICD-10-CM | POA: Diagnosis present

## 2013-05-31 DIAGNOSIS — Z9889 Other specified postprocedural states: Secondary | ICD-10-CM

## 2013-05-31 DIAGNOSIS — I2510 Atherosclerotic heart disease of native coronary artery without angina pectoris: Secondary | ICD-10-CM | POA: Diagnosis present

## 2013-05-31 DIAGNOSIS — I2589 Other forms of chronic ischemic heart disease: Secondary | ICD-10-CM | POA: Diagnosis present

## 2013-05-31 DIAGNOSIS — I4891 Unspecified atrial fibrillation: Secondary | ICD-10-CM

## 2013-05-31 DIAGNOSIS — Z7901 Long term (current) use of anticoagulants: Secondary | ICD-10-CM

## 2013-05-31 DIAGNOSIS — I10 Essential (primary) hypertension: Secondary | ICD-10-CM | POA: Diagnosis present

## 2013-05-31 DIAGNOSIS — I447 Left bundle-branch block, unspecified: Secondary | ICD-10-CM | POA: Diagnosis present

## 2013-05-31 DIAGNOSIS — K219 Gastro-esophageal reflux disease without esophagitis: Secondary | ICD-10-CM | POA: Diagnosis present

## 2013-05-31 DIAGNOSIS — Z833 Family history of diabetes mellitus: Secondary | ICD-10-CM

## 2013-05-31 DIAGNOSIS — E875 Hyperkalemia: Secondary | ICD-10-CM | POA: Diagnosis not present

## 2013-05-31 DIAGNOSIS — I5033 Acute on chronic diastolic (congestive) heart failure: Secondary | ICD-10-CM

## 2013-05-31 DIAGNOSIS — I639 Cerebral infarction, unspecified: Secondary | ICD-10-CM | POA: Diagnosis present

## 2013-05-31 DIAGNOSIS — I69959 Hemiplegia and hemiparesis following unspecified cerebrovascular disease affecting unspecified side: Secondary | ICD-10-CM

## 2013-05-31 DIAGNOSIS — I509 Heart failure, unspecified: Secondary | ICD-10-CM

## 2013-05-31 DIAGNOSIS — I959 Hypotension, unspecified: Secondary | ICD-10-CM | POA: Diagnosis present

## 2013-05-31 DIAGNOSIS — I2789 Other specified pulmonary heart diseases: Secondary | ICD-10-CM | POA: Diagnosis present

## 2013-05-31 DIAGNOSIS — I5043 Acute on chronic combined systolic (congestive) and diastolic (congestive) heart failure: Secondary | ICD-10-CM | POA: Diagnosis present

## 2013-05-31 DIAGNOSIS — Z9189 Other specified personal risk factors, not elsewhere classified: Secondary | ICD-10-CM | POA: Diagnosis present

## 2013-05-31 DIAGNOSIS — I4892 Unspecified atrial flutter: Secondary | ICD-10-CM | POA: Diagnosis present

## 2013-05-31 DIAGNOSIS — E119 Type 2 diabetes mellitus without complications: Secondary | ICD-10-CM | POA: Diagnosis present

## 2013-05-31 DIAGNOSIS — E669 Obesity, unspecified: Secondary | ICD-10-CM | POA: Diagnosis present

## 2013-05-31 DIAGNOSIS — G8191 Hemiplegia, unspecified affecting right dominant side: Secondary | ICD-10-CM | POA: Diagnosis present

## 2013-05-31 DIAGNOSIS — I440 Atrioventricular block, first degree: Secondary | ICD-10-CM | POA: Diagnosis present

## 2013-05-31 DIAGNOSIS — Z951 Presence of aortocoronary bypass graft: Secondary | ICD-10-CM

## 2013-05-31 DIAGNOSIS — Z794 Long term (current) use of insulin: Secondary | ICD-10-CM

## 2013-05-31 DIAGNOSIS — N179 Acute kidney failure, unspecified: Secondary | ICD-10-CM | POA: Diagnosis present

## 2013-05-31 DIAGNOSIS — Z79899 Other long term (current) drug therapy: Secondary | ICD-10-CM

## 2013-05-31 DIAGNOSIS — Z23 Encounter for immunization: Secondary | ICD-10-CM

## 2013-05-31 HISTORY — DX: Paroxysmal atrial fibrillation: I48.0

## 2013-05-31 HISTORY — DX: Other specified postprocedural states: Z98.890

## 2013-05-31 HISTORY — DX: Other specified personal risk factors, not elsewhere classified: Z91.89

## 2013-05-31 HISTORY — DX: Long term (current) use of anticoagulants: Z79.01

## 2013-05-31 LAB — CBC
MCH: 27 pg (ref 26.0–34.0)
MCHC: 33.1 g/dL (ref 30.0–36.0)
RBC: 4.34 MIL/uL (ref 4.22–5.81)
RDW: 13.8 % (ref 11.5–15.5)

## 2013-05-31 LAB — URINALYSIS, ROUTINE W REFLEX MICROSCOPIC
Bilirubin Urine: NEGATIVE
Glucose, UA: 100 mg/dL — AB
Hgb urine dipstick: NEGATIVE
Nitrite: NEGATIVE
Protein, ur: NEGATIVE mg/dL
Specific Gravity, Urine: 1.021 (ref 1.005–1.030)
Urobilinogen, UA: 0.2 mg/dL (ref 0.0–1.0)
pH: 6.5 (ref 5.0–8.0)

## 2013-05-31 LAB — BASIC METABOLIC PANEL
BUN: 18 mg/dL (ref 6–23)
CO2: 21 mEq/L (ref 19–32)
Calcium: 8.7 mg/dL (ref 8.4–10.5)
Chloride: 105 mEq/L (ref 96–112)
Creatinine, Ser: 1.15 mg/dL (ref 0.50–1.35)
GFR calc Af Amer: 75 mL/min — ABNORMAL LOW (ref >90)
GFR calc non Af Amer: 65 mL/min — ABNORMAL LOW (ref >90)
Glucose, Bld: 80 mg/dL (ref 70–99)
Potassium: 3.9 mEq/L (ref 3.5–5.1)
Sodium: 139 mEq/L (ref 135–145)

## 2013-05-31 LAB — POCT I-STAT TROPONIN I: Troponin i, poc: 0.02 ng/mL (ref 0.00–0.08)

## 2013-05-31 MED ORDER — DILTIAZEM HCL 25 MG/5ML IV SOLN: 10.0000 mg | Freq: Once | INTRAVENOUS | Status: DC

## 2013-05-31 MED ORDER — DILTIAZEM HCL 100 MG IV SOLR
5.0000 mg/h | Freq: Once | INTRAVENOUS | Status: AC
  Administered 2013-05-31: 5 mg/h via INTRAVENOUS
  Filled 2013-05-31: qty 100

## 2013-05-31 MED ORDER — SODIUM CHLORIDE 0.9 % IV BOLUS (SEPSIS)
1000.0000 mL | Freq: Once | INTRAVENOUS | Status: AC
  Administered 2013-05-31: 1000 mL via INTRAVENOUS

## 2013-05-31 MED ORDER — DEXTROSE 250 MG/ML IV SOLN
25.0000 g | Freq: Once | INTRAVENOUS | Status: DC
Start: 2013-05-31 — End: 2013-05-31
  Filled 2013-05-31: qty 100

## 2013-05-31 NOTE — ED Notes (Signed)
Per [facility] pt sent to ED for further evaluation of new onset A-Fib, pt reports symptoms started 2 days ago, followed up with his PCP today in regards sudden onset of SOB, chest pressure, and rapid heart palpitations. 20g Left hand, HR 140-150, BP 116/78

## 2013-05-31 NOTE — ED Notes (Signed)
BP lower, confirmed, EDP notified, denies CP, sob feels better, reports HA "r/t hunger", cardizem gtt at 10cc/hr. Pt alert, NAD, calm, "feels better",  jovial/joking.

## 2013-05-31 NOTE — ED Notes (Signed)
[name]; pt's niece;  [phone]

## 2013-05-31 NOTE — ED Notes (Signed)
CBG taken = 82 

## 2013-05-31 NOTE — H&P (Signed)
History and Physical   Patient ID: [name] MRN: [id number], DOB/AGE: Jun 08, 1946   Admit date: 05/31/2013 Date of Consult: 05/31/2013   Primary Physician: Cassell Smiles., MD Primary Cardiologist: Dr. [name], [facility]  HPI: [name] is a 67 y.o. male with PMHx of chronic IHD s/p 3-vessel CABG (LIMA to LAD, SVG to LCx, SVG to rPDA), valvular HD s/p 23mm Edwards SAVR in 2010 (last Echo Feb 2013 with prosthetic AV peak gradient=26, mean gradient=14), h/o CVA in 2003 with residual ambulatory difficulty and right-sided weakness, obesity and GERD.  He lives at home with his sister.  He has no h/o arrhythmias and his last Echo showed a normal LV size and systolic function with Grade 1 diastolic dysfunction.  He was last seen in the office by Dr. [name] in August 2014 at which point he was doing well from the cardiac standpoint but had belching complaints and had issues with his GERD which drastically improved with pepcid.  He was in his baseline state of health until 2 days ago when he noted 1-2 loose stools but no bleeding, no abdominal pain.  He did have a couple of episodes of dry heaves and 1-2 episodes of small volume non-bloody vomiting.  He reports no significant change with his po intake however.  Over the last day his bowel habits have returned to baseline and he has not had any further nausea or vomiting.  He saw his PCP's office today due to SOB and palpitations and after an ECG showed tachyarrhythmia they called 911 and an ambulance brought him to the [facility] ED.  On arrival his ECG showed a wide-complex rhythm at 157 bpm and telemetry showed irregularly irregular rhythm.  Historical ECG's show NSR with LBBB pattern thus the LBBB was not new.  In the ED, physician staff concluded that he had new onset AF and started a Diltiazem infusion with some improvement in the HR but significant irregularity and variance in his HR continued.  He reported to be largely  asymptomatic at rest during my interview and history-taking.  Problem List: Past Medical History  Diagnosis Date  . Hypertension   . Diabetes mellitus     type 2   . History of valve replacement   . Thyroid disease   . Stroke 2003    R-sided weakness & upper extremity swelling   . Coronary artery disease   . Dyslipidemia     Past Surgical History  Procedure Laterality Date  . Coronary artery bypass graft  01/26/2009    LIMA to LAD, SVG to circumflex, SVG to PDA  . Aortic valve replacement  01/26/2009    [facility] Ease bioprosthetic 23mm valve  . Transthoracic echocardiogram  09/2011    grade 1 diastolic dysfunction; increasing valve gradient      Allergies: No Known Allergies  Home Medications: Prior to Admission medications   Medication Sig Start Date End Date Taking? Authorizing Provider  acetaminophen (TYLENOL) 500 MG tablet Take 1,000 mg by mouth every 6 (six) hours as needed. For pain/headaches    Yes Historical Provider, MD  ALPRAZolam Prudy Feeler) 1 MG tablet Take 1 mg by mouth 4 (four) times daily as needed for anxiety.    Yes Historical Provider, MD  aspirin 81 MG EC tablet Take 81 mg by mouth daily.     Yes Historical Provider, MD  bismuth subsalicylate (PEPTO BISMOL) 262 MG/15ML suspension Take 30 mLs by mouth every 6 (six) hours as needed for indigestion.  Yes Historical Provider, MD  carvedilol (COREG) 3.125 MG tablet Take 3.125 mg by mouth 2 (two) times daily.     Yes Historical Provider, MD  docusate sodium (COLACE) 100 MG capsule Take 100 mg by mouth 2 (two) times daily.     Yes Historical Provider, MD  fenofibrate 160 MG tablet Take 160 mg by mouth daily.   Yes Historical Provider, MD  fish oil-omega-3 fatty acids 1000 MG capsule Take 2 g by mouth daily.     Yes Historical Provider, MD  gabapentin (NEURONTIN) 300 MG capsule Take 300 mg by mouth 3 (three) times daily.     Yes Historical Provider, MD  insulin glargine (LANTUS) 100 UNIT/ML injection Inject 60-70  Units into the skin 2 (two) times daily. Takes70 units in the morning and 60 units in the evening   Yes Historical Provider, MD  insulin lispro (HUMALOG) 100 UNIT/ML injection Inject 35-45 Units into the skin 3 (three) times daily before meals. 45U bid and 35U QD   Yes Historical Provider, MD  levothyroxine (SYNTHROID, LEVOTHROID) 25 MCG tablet Take 25 mcg by mouth daily.     Yes Historical Provider, MD  lisinopril (PRINIVIL,ZESTRIL) 10 MG tablet Take 10 mg by mouth daily.   Yes Historical Provider, MD  pantoprazole (PROTONIX) 40 MG tablet Take 40 mg by mouth 2 (two) times daily.   Yes Historical Provider, MD  simvastatin (ZOCOR) 40 MG tablet Take 40 mg by mouth at bedtime.   Yes Historical Provider, MD  torsemide (DEMADEX) 20 MG tablet Take 10-20 mg by mouth daily as needed (for fluid).  03/23/13  Yes Historical Provider, MD    Inpatient Medications:    (Not in a hospital admission)  Family History  Problem Relation Age of Onset  . Heart attack Mother   . Liver disease Brother   . Diabetes Sister     x4     History   Social History  . Marital Status: Single    Spouse Name: N/A    Number of Children: 4  . Years of Education: N/A   Occupational History  . 4    Social History Main Topics  . Smoking status: Former Smoker    Types: Cigarettes    Quit date: 08/15/2001  . Smokeless tobacco: Former Neurosurgeon    Types: Chew  . Alcohol Use: No  . Drug Use: No  . Sexual Activity: Not on file   Other Topics Concern  . Not on file   Social History Narrative  . No narrative on file     Review of Systems: All other systems reviewed and are otherwise negative except as noted above.  Physical Exam: Blood pressure 130/67, pulse 134, temperature 97.7 F (36.5 C), temperature source Oral, resp. rate 30, SpO2 96.00%.  General: Well developed, well nourished, in no acute distress. Head: Normocephalic, atraumatic, sclera non-icteric, no xanthomas, nares are without discharge.  Neck:  Negative for carotid bruits. JVD not elevated. Lungs: Clear bilaterally to auscultation without wheezes, rales, or rhonchi. Breathing is unlabored. Heart: RRR with S1 S2. No murmurs, rubs, or gallops appreciated. Abdomen: Soft, obese, non-tender, non-distended with normoactive bowel sounds. No hepatomegaly. No rebound/guarding. No obvious abdominal masses. Msk:  Strength and tone appears normal for age. Extremities: No clubbing, cyanosis or edema.  Distal pedal pulses are 2+ and equal bilaterally. Neuro: Alert and oriented X 3. Moves all extremities spontaneously.  RUE+RLE strength 4/5, LUE+LLE strength 5/5. Psych:  Responds to questions appropriately with a normal affect.  Labs: Recent Labs     05/31/13  1611  WBC  10.9*  HGB  11.7*  HCT  35.3*  MCV  81.3  PLT  221   No results found for this basename: VITAMINB12, FOLATE, FERRITIN, TIBC, IRON, RETICCTPCT,  in the last 72 hours No results found for this basename: DDIMER,  in the last 72 hours  Recent Labs Lab 05/31/13 1611  NA 139  K 3.9  CL 105  CO2 21  BUN 18  CREATININE 1.15  CALCIUM 8.7  GLUCOSE 80   No results found for this basename: HGBA1C,  in the last 72 hours No results found for this basename: CKTOTAL, CKMB, CKMBINDEX, TROPONINI,  in the last 72 hours No components found with this basename: POCBNP,  No results found for this basename: CHOL, HDL, LDLCALC, TRIG, CHOLHDL, LDLDIRECT,  in the last 72 hours No results found for this basename: TSH, T4TOTAL, FREET3, T3FREE, THYROIDAB,  in the last 72 hours  Radiology/Studies: US Soft Tissue Head/neck  05/20/2013   CLINICAL DATA:  Goiter.  EXAM: THYROID ULTRASOUND  TECHNIQUE: Ultrasound examination of the thyroid gland and adjacent soft tissues was performed.  COMPARISON:  Ultrasound dated 06/01/2012  FINDINGS: Right thyroid lobe  Measurements: 5.6 x 2.8 x 1.3 cm.  No nodules visualized.  Left thyroid lobe  Measurements: 11.3 x 5.6 x 2.9 cm. 3.0 x 2.6 x 2.4 cm solid nodule  in the left lobe, unchanged. Lower pole nodule measures 5.5 x 3.2 x 5.1 cm, essentially unchanged.  Isthmus  Thickness: 1.3 cm.  No nodules visualized.  Lymphadenopathy  None visualized.  IMPRESSION: Multinodular goiter with dominant nodules in the left lobe as described. There has been slight enlargement of the nodules and of the overall size of the left lobe since the prior study.   Electronically Signed   By: Geanie Cooley M.D.   On: 05/20/2013 15:18   Dg Chest Portable 1 View  05/31/2013   CLINICAL DATA:  AFib, shortness of breath.  EXAM: PORTABLE CHEST - 1 VIEW  COMPARISON:  Chest radiograph 03/21/2011.  FINDINGS: Patient is rotated to the right. Stable enlarged cardiac and mediastinal contours status post median sternotomy and CABG procedure. Minimal right basilar atelectasis. Elevation of the right hemidiaphragm. No large consolidative pulmonary opacities. No pleural effusion or pneumothorax. Regional skeleton is unremarkable.  IMPRESSION: Cardiomegaly. Elevation of the right hemidiaphragm with minimal right basilar atelectasis.   Electronically Signed   By: Annia Belt M.D.   On: 05/31/2013 16:30    05/31/13 12-lead ECG:  AF at 157 bpm (wide complex due to known underlying LBBB) irregularly irregular.  ASSESSMENT:  Mr. Wanat is a 67 yo WM with PMHx of chronic IHD s/p 3-vessel CABG (LIMA to LAD, SVG to LCx, SVG to rPDA), valvular HD s/p 23mm Edwards SAVR in 2010 (last Echo Feb 2013 with prosthetic AV peak gradient=26, mean gradient=14), h/o CVA in 2003 with residual ambulatory difficulty and right-sided weakness, obesity and GERD who presents with new onset AFib with RVR along with SOB and palpitations.  PLAN:  1-Admit to Stepdown monitoring unit to service of Dr. [name]. 2-New Onset/Recent Onset AF with RVR:  Diltiazem infusion, additional rate control with IV BB and discontinue home dose coreg.  Anticoagulation for secondary stroke risk prevention with Heparin infusion per pharmacy protocol.   Check 2D surface Echo in AM.  NPO after midnight incase of need for TEE+DCCV in AM as onset of symptoms is unclear and may have been >48 hours ago.  Long term anticoagulation considerations include possible need for warfarin.  Check daily PT/INR while inpatient. 3-HFpEF (last Echo Feb. 2013) likely with decompensated diastolic CHF:  Continue ACE-inhibitor, dose IV Lasix 40mg  ONCE and monitor diuresis and I&O's closely, consider restarting home regimen of QOD torsemide. 4-H/o Valvular HD s/p bioprosthetic SAVR:  Check 2D echo to follow-up on valve gradients. 5-Chronic IHD s/p CABG with no active angina at present.  Will dose IV BB to control HR in AF, continue ACE-inhibitor, continue aspirin daily, monitor for symptoms and repeat ECG PRN. 6-DM-Type 2 IDDM:  Continue Lantus + pre-meal regimen but will reduce Lantus dose as patient will be NPO after midnight.  Check HbA1C. 7-Hypothyroidism:  Continue synthroid per home regimen, check TSH and FT4.  Code Status:  FULL CODE.  Signed, Christie Nottingham, MD [facility] 05/31/2013, 10:41 PM

## 2013-05-31 NOTE — ED Notes (Addendum)
Dr. Ranae Palms aware of pt's HR, BP, HA & cbg.  V.o.v. From Dr. Ranae Palms.

## 2013-05-31 NOTE — ED Notes (Signed)
Dr. Ranae Palms made aware of pt's new onset Afib and current HR. No further orders received at this time.

## 2013-05-31 NOTE — ED Notes (Signed)
Patient asked for and received Happy Meal with Decaf coffee.

## 2013-05-31 NOTE — ED Provider Notes (Signed)
CSN: [id number]     Arrival date & time 05/31/13  1550 History   First MD Initiated Contact with Patient 05/31/13 1559     Chief Complaint  Patient presents with  . Atrial Fibrillation   (Consider location/radiation/quality/duration/timing/severity/associated sxs/prior Treatment) Patient is a 67 y.o. male presenting with shortness of breath.  Shortness of Breath Severity:  Moderate Onset quality:  Gradual Duration:  2 days Timing:  Constant Progression:  Worsening Chronicity:  New Context: activity   Relieved by:  Nothing Worsened by:  Nothing tried Ineffective treatments:  None tried Associated symptoms: no abdominal pain, no chest pain, no cough, no fever, no headaches, no hemoptysis, no rash, no sore throat and no vomiting     Past Medical History  Diagnosis Date  . Hypertension   . Diabetes mellitus     type 2   . History of valve replacement   . Thyroid disease   . Stroke 2003    R-sided weakness & upper extremity swelling   . Coronary artery disease   . Dyslipidemia    Past Surgical History  Procedure Laterality Date  . Coronary artery bypass graft  01/26/2009    LIMA to LAD, SVG to circumflex, SVG to PDA  . Aortic valve replacement  01/26/2009    [facility] Ease bioprosthetic 23mm valve  . Transthoracic echocardiogram  09/2011    grade 1 diastolic dysfunction; increasing valve gradient    Family History  Problem Relation Age of Onset  . Heart attack Mother   . Liver disease Brother   . Diabetes Sister     x4   History  Substance Use Topics  . Smoking status: Former Smoker    Types: Cigarettes    Quit date: 08/15/2001  . Smokeless tobacco: Former Neurosurgeon    Types: Chew  . Alcohol Use: No    Review of Systems  Constitutional: Negative for fever and chills.  HENT: Negative for congestion, rhinorrhea and sore throat.   Eyes: Negative for photophobia and visual disturbance.  Respiratory: Positive for shortness of breath. Negative for cough and  hemoptysis.   Cardiovascular: Negative for chest pain and leg swelling.  Gastrointestinal: Negative for nausea, vomiting, abdominal pain, diarrhea and constipation.  Endocrine: Negative for polydipsia and polyuria.  Genitourinary: Negative for dysuria and hematuria.  Musculoskeletal: Negative for arthralgias and back pain.  Skin: Negative for color change and rash.  Neurological: Negative for dizziness, syncope, light-headedness and headaches.  Hematological: Negative for adenopathy. Does not bruise/bleed easily.  All other systems reviewed and are negative.    Allergies  Review of patient's allergies indicates no known allergies.  Home Medications   No current outpatient prescriptions on file. BP 136/80  Pulse 134  Temp(Src) 98 F (36.7 C) (Oral)  Resp 25  Ht 5' 10.5" (1.791 m)  Wt 255 lb 8.2 oz (115.9 kg)  BMI 36.13 kg/m2  SpO2 99% Physical Exam  Vitals reviewed. Constitutional: He is oriented to person, place, and time. He appears well-developed and well-nourished.  HENT:  Head: Normocephalic and atraumatic.  Eyes: Conjunctivae and EOM are normal.  Neck: Normal range of motion. Neck supple.  Cardiovascular: Normal heart sounds.  An irregularly irregular rhythm present. Tachycardia present.   Pulmonary/Chest: Effort normal and breath sounds normal. No respiratory distress.  Abdominal: He exhibits no distension. There is no tenderness. There is no rebound and no guarding.  Musculoskeletal: Normal range of motion.  Neurological: He is alert and oriented to person, place, and time.  Skin: Skin  is warm and dry.    ED Course  Procedures (including critical care time) Labs Review Labs Reviewed  CBC - Abnormal; Notable for the following:    WBC 10.9 (*)    Hemoglobin 11.7 (*)    HCT 35.3 (*)    All other components within normal limits  BASIC METABOLIC PANEL - Abnormal; Notable for the following:    GFR calc non Af Amer 65 (*)    GFR calc Af Amer 75 (*)    All other  components within normal limits  URINALYSIS, ROUTINE W REFLEX MICROSCOPIC - Abnormal; Notable for the following:    Glucose, UA 100 (*)    All other components within normal limits  MRSA PCR SCREENING  GLUCOSE, CAPILLARY  TROPONIN I  TROPONIN I  TROPONIN I  PROTIME-INR  CBC WITH DIFFERENTIAL  TSH  T4, FREE  BASIC METABOLIC PANEL  MAGNESIUM  HEMOGLOBIN A1C  POCT I-STAT TROPONIN I   Imaging Review Dg Chest Portable 1 View  05/31/2013   CLINICAL DATA:  AFib, shortness of breath.  EXAM: PORTABLE CHEST - 1 VIEW  COMPARISON:  Chest radiograph 03/21/2011.  FINDINGS: Patient is rotated to the right. Stable enlarged cardiac and mediastinal contours status post median sternotomy and CABG procedure. Minimal right basilar atelectasis. Elevation of the right hemidiaphragm. No large consolidative pulmonary opacities. No pleural effusion or pneumothorax. Regional skeleton is unremarkable.  IMPRESSION: Cardiomegaly. Elevation of the right hemidiaphragm with minimal right basilar atelectasis.   Electronically Signed   By: Annia Belt M.D.   On: 05/31/2013 16:30    EKG Interpretation     Ventricular Rate:    PR Interval:    QRS Duration:   QT Interval:    QTC Calculation:   R Axis:     Text Interpretation:              Date: 06/01/2013  Rate: 157  Rhythm: Atrial fibrillation  QRS Axis: normal  Intervals: normal  ST/T Wave abnormalities: normal  Conduction Disutrbances: LBBB  Narrative Interpretation: Afib with RVR with existing LBBB  Old EKG Reviewed: Now in afib with RVR    MDM   1. Atrial fibrillation with RVR    67 y.o. male  with pertinent PMH of remote afib, DM, CAD, CVA presents with dyspnea x2 days. She was in normal state of health until 2 days ago when he began to develop exertional dyspnea. He denies chest pain, GI symptoms, fever, or other symptoms. He was seen by his PCP, then transferred here for further evaluation. On arrival EKG as above with atrial fibrillation  with RVR. Vital signs initially with borderline hypertension, however this resolved with normal saline fluid bolus. Patient given diltiazem and started on a drip with only transient improvement in symptoms. Initial troponin negative. Consult cardiology and patient admitted for further workup of new onset atrial fibrillation with RVR.    Labs and imaging as above reviewed by myself and attending,Dr. Ranae Palms, with whom case was discussed.   1. Atrial fibrillation with RVR         Noel Gerold, MD 06/01/13 [phone]

## 2013-05-31 NOTE — ED Notes (Addendum)
Cardizem 5mg  bolus administered from Cardizem drip, unable to chart under MAR, TO V&RB per EDP Littie Deeds

## 2013-05-31 NOTE — ED Notes (Signed)
Pt reports new onset of non radiating mid-sternum chest pressure, SOB, and heart palpitations x2 days, pt reports his symptoms increased with exertion. Pt reports he had diarrhea on Monday which has now subsided.

## 2013-06-01 DIAGNOSIS — I4891 Unspecified atrial fibrillation: Principal | ICD-10-CM

## 2013-06-01 DIAGNOSIS — I959 Hypotension, unspecified: Secondary | ICD-10-CM | POA: Diagnosis present

## 2013-06-01 DIAGNOSIS — I447 Left bundle-branch block, unspecified: Secondary | ICD-10-CM | POA: Diagnosis present

## 2013-06-01 DIAGNOSIS — E119 Type 2 diabetes mellitus without complications: Secondary | ICD-10-CM

## 2013-06-01 DIAGNOSIS — I6350 Cerebral infarction due to unspecified occlusion or stenosis of unspecified cerebral artery: Secondary | ICD-10-CM

## 2013-06-01 DIAGNOSIS — Z951 Presence of aortocoronary bypass graft: Secondary | ICD-10-CM

## 2013-06-01 DIAGNOSIS — E669 Obesity, unspecified: Secondary | ICD-10-CM | POA: Diagnosis present

## 2013-06-01 DIAGNOSIS — I5033 Acute on chronic diastolic (congestive) heart failure: Secondary | ICD-10-CM | POA: Diagnosis present

## 2013-06-01 DIAGNOSIS — Z954 Presence of other heart-valve replacement: Secondary | ICD-10-CM

## 2013-06-01 LAB — GLUCOSE, CAPILLARY
Glucose-Capillary: 232 mg/dL — ABNORMAL HIGH (ref 70–99)
Glucose-Capillary: 220 mg/dL — ABNORMAL HIGH (ref 70–99)
Glucose-Capillary: 322 mg/dL — ABNORMAL HIGH (ref 70–99)

## 2013-06-01 LAB — BASIC METABOLIC PANEL
CO2: 23 mEq/L (ref 19–32)
Calcium: 8.8 mg/dL (ref 8.4–10.5)
Creatinine, Ser: 1.4 mg/dL — ABNORMAL HIGH (ref 0.50–1.35)
GFR calc Af Amer: 59 mL/min — ABNORMAL LOW (ref >90)
GFR calc non Af Amer: 51 mL/min — ABNORMAL LOW (ref >90)
Sodium: 133 mEq/L — ABNORMAL LOW (ref 135–145)

## 2013-06-01 LAB — CBC WITH DIFFERENTIAL/PLATELET
Basophils Absolute: 0 10*3/uL (ref 0.0–0.1)
Basophils Relative: 0 % (ref 0–1)
Eosinophils Absolute: 0.1 10*3/uL (ref 0.0–0.7)
Eosinophils Relative: 1 % (ref 0–5)
Lymphocytes Relative: 19 % (ref 12–46)
Lymphs Abs: 1.9 10*3/uL (ref 0.7–4.0)
MCHC: 32.9 g/dL (ref 30.0–36.0)
MCV: 82 fL (ref 78.0–100.0)
Neutrophils Relative %: 68 % (ref 43–77)
Platelets: 212 10*3/uL (ref 150–400)
RBC: 4.27 MIL/uL (ref 4.22–5.81)
RDW: 14.2 % (ref 11.5–15.5)
WBC: 10.1 10*3/uL (ref 4.0–10.5)

## 2013-06-01 LAB — TROPONIN I
Troponin I: <0.3 ng/mL (ref <0.30)
Troponin I: <0.3 ng/mL (ref <0.30)
Troponin I: <0.3 ng/mL (ref <0.30)

## 2013-06-01 LAB — HEPARIN LEVEL (UNFRACTIONATED)
Heparin Unfractionated: 0.24 IU/mL — ABNORMAL LOW (ref 0.30–0.70)
Heparin Unfractionated: 0.22 IU/mL — ABNORMAL LOW (ref 0.30–0.70)

## 2013-06-01 LAB — T4, FREE: Free T4: 1.74 ng/dL (ref 0.80–1.80)

## 2013-06-01 LAB — HEMOGLOBIN A1C: Hgb A1c MFr Bld: 10.1 % — ABNORMAL HIGH (ref <5.7)

## 2013-06-01 LAB — PROTIME-INR
INR: 1.1 (ref 0.00–1.49)
Prothrombin Time: 14 seconds (ref 11.6–15.2)

## 2013-06-01 MED ORDER — HEPARIN BOLUS VIA INFUSION
4000.0000 [IU] | Freq: Once | INTRAVENOUS | Status: AC
  Administered 2013-06-01: 01:00:00 4000 [IU] via INTRAVENOUS
  Filled 2013-06-01: qty 4000

## 2013-06-01 MED ORDER — ASPIRIN 81 MG PO CHEW
81.0000 mg | CHEWABLE_TABLET | Freq: Every day | ORAL | Status: DC
  Administered 2013-06-01 – 2013-06-04 (×4): 81 mg via ORAL
  Filled 2013-06-01 (×4): qty 1

## 2013-06-01 MED ORDER — SODIUM CHLORIDE 0.9 % IJ SOLN: 3.0000 mL | INTRAMUSCULAR | Status: DC | PRN

## 2013-06-01 MED ORDER — AMIODARONE HCL IN DEXTROSE 360-4.14 MG/200ML-% IV SOLN
60.0000 mg/h | INTRAVENOUS | Status: AC
  Administered 2013-06-01: 60 mg/h via INTRAVENOUS
  Filled 2013-06-01 (×2): qty 200

## 2013-06-01 MED ORDER — AMIODARONE HCL IN DEXTROSE 360-4.14 MG/200ML-% IV SOLN
30.0000 mg/h | INTRAVENOUS | Status: DC
  Administered 2013-06-01 – 2013-06-02 (×2): 30 mg/h via INTRAVENOUS
  Filled 2013-06-01 (×7): qty 200

## 2013-06-01 MED ORDER — FUROSEMIDE 10 MG/ML IJ SOLN
40.0000 mg | Freq: Once | INTRAMUSCULAR | Status: AC
  Administered 2013-06-01: 01:00:00 40 mg via INTRAVENOUS
  Filled 2013-06-01: qty 4

## 2013-06-01 MED ORDER — SODIUM CHLORIDE 0.9 % IJ SOLN
3.0000 mL | Freq: Two times a day (BID) | INTRAMUSCULAR | Status: DC
  Administered 2013-06-01 – 2013-06-02 (×2): 3 mL via INTRAVENOUS
  Administered 2013-06-03: 21:00:00 via INTRAVENOUS
  Administered 2013-06-04 – 2013-06-05 (×3): 3 mL via INTRAVENOUS

## 2013-06-01 MED ORDER — INSULIN ASPART 100 UNIT/ML ~~LOC~~ SOLN
25.0000 [IU] | Freq: Three times a day (TID) | SUBCUTANEOUS | Status: DC
  Administered 2013-06-01 – 2013-06-02 (×3): 25 [IU] via SUBCUTANEOUS

## 2013-06-01 MED ORDER — AMIODARONE LOAD VIA INFUSION
150.0000 mg | Freq: Once | INTRAVENOUS | Status: AC
  Administered 2013-06-01: 150 mg via INTRAVENOUS
  Filled 2013-06-01: qty 83.34

## 2013-06-01 MED ORDER — ONDANSETRON HCL 4 MG/2ML IJ SOLN
4.0000 mg | Freq: Four times a day (QID) | INTRAMUSCULAR | Status: DC | PRN
  Administered 2013-06-01 (×2): 4 mg via INTRAVENOUS
  Filled 2013-06-01 (×2): qty 2

## 2013-06-01 MED ORDER — POTASSIUM CHLORIDE CRYS ER 20 MEQ PO TBCR
20.0000 meq | EXTENDED_RELEASE_TABLET | Freq: Every day | ORAL | Status: DC
  Administered 2013-06-01: 10:00:00 20 meq via ORAL
  Filled 2013-06-01 (×2): qty 1

## 2013-06-01 MED ORDER — SODIUM CHLORIDE 0.9 % IV SOLN
250.0000 mL | INTRAVENOUS | Status: DC | PRN
  Administered 2013-06-01: 250 mL via INTRAVENOUS

## 2013-06-01 MED ORDER — LEVOTHYROXINE SODIUM 25 MCG PO TABS
25.0000 ug | ORAL_TABLET | Freq: Every day | ORAL | Status: DC
  Administered 2013-06-01 – 2013-06-07 (×7): 25 ug via ORAL
  Filled 2013-06-01 (×8): qty 1

## 2013-06-01 MED ORDER — HEPARIN (PORCINE) IN NACL 100-0.45 UNIT/ML-% IJ SOLN
2050.0000 [IU]/h | INTRAMUSCULAR | Status: DC
  Administered 2013-06-01: 01:00:00 1600 [IU]/h via INTRAVENOUS
  Administered 2013-06-02 – 2013-06-03 (×2): 2050 [IU]/h via INTRAVENOUS
  Filled 2013-06-01 (×10): qty 250

## 2013-06-01 MED ORDER — ACETAMINOPHEN 325 MG PO TABS
650.0000 mg | ORAL_TABLET | Freq: Four times a day (QID) | ORAL | Status: DC | PRN
  Administered 2013-06-02 – 2013-06-07 (×3): 650 mg via ORAL
  Filled 2013-06-01 (×3): qty 2

## 2013-06-01 MED ORDER — ALPRAZOLAM 0.5 MG PO TABS
1.0000 mg | ORAL_TABLET | Freq: Four times a day (QID) | ORAL | Status: DC | PRN
  Administered 2013-06-06: 1 mg via ORAL
  Filled 2013-06-01: qty 2

## 2013-06-01 MED ORDER — INSULIN GLARGINE 100 UNIT/ML ~~LOC~~ SOLN
35.0000 [IU] | Freq: Two times a day (BID) | SUBCUTANEOUS | Status: DC
  Administered 2013-06-01 – 2013-06-02 (×3): 35 [IU] via SUBCUTANEOUS
  Filled 2013-06-01 (×5): qty 0.35

## 2013-06-01 MED ORDER — METOPROLOL TARTRATE 12.5 MG HALF TABLET
12.5000 mg | ORAL_TABLET | Freq: Two times a day (BID) | ORAL | Status: DC
  Administered 2013-06-01 – 2013-06-07 (×13): 12.5 mg via ORAL
  Filled 2013-06-01 (×14): qty 1

## 2013-06-01 MED ORDER — DILTIAZEM HCL 100 MG IV SOLR
5.0000 mg/h | INTRAVENOUS | Status: DC
  Administered 2013-06-01 – 2013-06-02 (×2): 15 mg/h via INTRAVENOUS
  Filled 2013-06-01 (×2): qty 100

## 2013-06-01 MED ORDER — METOPROLOL TARTRATE 1 MG/ML IV SOLN
5.0000 mg | Freq: Four times a day (QID) | INTRAVENOUS | Status: DC
  Administered 2013-06-01 (×2): 5 mg via INTRAVENOUS
  Filled 2013-06-01 (×4): qty 5

## 2013-06-01 MED ORDER — DILTIAZEM HCL 100 MG IV SOLR
5.0000 mg/h | INTRAVENOUS | Status: DC
  Administered 2013-06-01: 10:00:00 5 mg/h via INTRAVENOUS
  Administered 2013-06-01: 10 mg/h via INTRAVENOUS
  Filled 2013-06-01 (×2): qty 100

## 2013-06-01 MED ORDER — GABAPENTIN 300 MG PO CAPS
300.0000 mg | ORAL_CAPSULE | Freq: Three times a day (TID) | ORAL | Status: DC
  Administered 2013-06-01 – 2013-06-07 (×19): 300 mg via ORAL
  Filled 2013-06-01 (×21): qty 1

## 2013-06-01 MED ORDER — LISINOPRIL 10 MG PO TABS
10.0000 mg | ORAL_TABLET | Freq: Every day | ORAL | Status: DC
  Filled 2013-06-01: qty 1

## 2013-06-01 MED ORDER — SIMVASTATIN 40 MG PO TABS
40.0000 mg | ORAL_TABLET | Freq: Every day | ORAL | Status: DC
  Administered 2013-06-01: 40 mg via ORAL
  Filled 2013-06-01 (×2): qty 1

## 2013-06-01 MED ORDER — FUROSEMIDE 10 MG/ML IJ SOLN
40.0000 mg | Freq: Every day | INTRAMUSCULAR | Status: DC
  Administered 2013-06-01 – 2013-06-02 (×2): 40 mg via INTRAVENOUS
  Filled 2013-06-01 (×3): qty 4

## 2013-06-01 MED ORDER — ATORVASTATIN CALCIUM 20 MG PO TABS
20.0000 mg | ORAL_TABLET | Freq: Every day | ORAL | Status: DC
  Administered 2013-06-01 – 2013-06-06 (×6): 20 mg via ORAL
  Filled 2013-06-01 (×7): qty 1

## 2013-06-01 MED ORDER — INFLUENZA VAC SPLIT QUAD 0.5 ML IM SUSP
0.5000 mL | INTRAMUSCULAR | Status: AC
  Administered 2013-06-02: 0.5 mL via INTRAMUSCULAR
  Filled 2013-06-01: qty 0.5

## 2013-06-01 MED ORDER — PNEUMOCOCCAL VAC POLYVALENT 25 MCG/0.5ML IJ INJ
0.5000 mL | INJECTION | INTRAMUSCULAR | Status: AC
  Administered 2013-06-02: 0.5 mL via INTRAMUSCULAR
  Filled 2013-06-01: qty 0.5

## 2013-06-01 MED ORDER — OFF THE BEAT BOOK
Freq: Once | Status: AC
  Administered 2013-06-01: 02:00:00
  Filled 2013-06-01: qty 1

## 2013-06-01 MED ORDER — DOCUSATE SODIUM 100 MG PO CAPS
100.0000 mg | ORAL_CAPSULE | Freq: Two times a day (BID) | ORAL | Status: DC | PRN
  Filled 2013-06-01: qty 1

## 2013-06-01 MED ORDER — ASPIRIN 81 MG PO TBEC: 81.0000 mg | DELAYED_RELEASE_TABLET | Freq: Every day | ORAL | Status: DC

## 2013-06-01 NOTE — Progress Notes (Signed)
ANTICOAGULATION CONSULT NOTE - Initial Consult  Pharmacy Consult for Heparin Indication: atrial fibrillation  No Known Allergies  Patient Measurements: Height: 5' 10.5" (179.1 cm) Weight: 255 lb 8.2 oz (115.9 kg) IBW/kg (Calculated) : 74.15 Heparin Dosing Weight: 100 kg  Vital Signs: Temp: 98 F (36.7 C) (10/17 2345) Temp src: Oral (10/17 2345) BP: 136/80 mmHg (10/17 2345) Pulse Rate: 134 (10/17 2130)  Labs:  Recent Labs  05/31/13 1611  HGB 11.7*  HCT 35.3*  PLT 221  CREATININE 1.15    Estimated Creatinine Clearance: 82.3 ml/min (by C-G formula based on Cr of 1.15).   Medical History: Past Medical History  Diagnosis Date  . Hypertension   . Diabetes mellitus     type 2   . History of valve replacement   . Thyroid disease   . Stroke 2003    R-sided weakness & upper extremity swelling   . Coronary artery disease   . Dyslipidemia     Medications:  Prescriptions prior to admission  Medication Sig Dispense Refill  . acetaminophen (TYLENOL) 500 MG tablet Take 1,000 mg by mouth every 6 (six) hours as needed. For pain/headaches       . ALPRAZolam (XANAX) 1 MG tablet Take 1 mg by mouth 4 (four) times daily as needed for anxiety.       Marland Kitchen aspirin 81 MG EC tablet Take 81 mg by mouth daily.        Marland Kitchen bismuth subsalicylate (PEPTO BISMOL) 262 MG/15ML suspension Take 30 mLs by mouth every 6 (six) hours as needed for indigestion.      . carvedilol (COREG) 3.125 MG tablet Take 3.125 mg by mouth 2 (two) times daily.        Marland Kitchen docusate sodium (COLACE) 100 MG capsule Take 100 mg by mouth 2 (two) times daily.        . fenofibrate 160 MG tablet Take 160 mg by mouth daily.      . fish oil-omega-3 fatty acids 1000 MG capsule Take 2 g by mouth daily.        Marland Kitchen gabapentin (NEURONTIN) 300 MG capsule Take 300 mg by mouth 3 (three) times daily.        . insulin glargine (LANTUS) 100 UNIT/ML injection Inject 60-70 Units into the skin 2 (two) times daily. Takes70 units in the morning and 60  units in the evening      . insulin lispro (HUMALOG) 100 UNIT/ML injection Inject 35-45 Units into the skin 3 (three) times daily before meals. 45U bid and 35U QD      . levothyroxine (SYNTHROID, LEVOTHROID) 25 MCG tablet Take 25 mcg by mouth daily.        Marland Kitchen lisinopril (PRINIVIL,ZESTRIL) 10 MG tablet Take 10 mg by mouth daily.      . pantoprazole (PROTONIX) 40 MG tablet Take 40 mg by mouth 2 (two) times daily.      . simvastatin (ZOCOR) 40 MG tablet Take 40 mg by mouth at bedtime.      . torsemide (DEMADEX) 20 MG tablet Take 10-20 mg by mouth daily as needed (for fluid).         Assessment: 67 yo male with new onset Afib for heparin  Goal of Therapy:  Heparin level 0.3-0.7 units/ml Monitor platelets by anticoagulation protocol: Yes   Plan:  Heparin 4000 units IV bolus, then 1600 units/hr Check heparin level in 6 hours.  Mahlik Lenn, Gary Fleet 06/01/2013,12:26 AM

## 2013-06-01 NOTE — Progress Notes (Signed)
Subjective:  SOB  Objective:  Vital Signs in the last 24 hours: Temp:  [97.5 F (36.4 C)-98 F (36.7 C)] 97.6 F (36.4 C) (10/18 1237) Pulse Rate:  [26-165] 105 (10/18 1237) Resp:  [10-30] 18 (10/18 1237) BP: (62-148)/(37-131) 101/56 mmHg (10/18 1237) SpO2:  [85 %-100 %] 93 % (10/18 1237) Weight:  [255 lb 4.7 oz (115.8 kg)-255 lb 8.2 oz (115.9 kg)] 255 lb 4.7 oz (115.8 kg) (10/18 0428)  Intake/Output from previous day:  Intake/Output Summary (Last 24 hours) at 06/01/13 1337 Last data filed at 06/01/13 1241  Gross per 24 hour  Intake  344.9 ml  Output   1575 ml  Net -1230.1 ml    Physical Exam: General appearance: alert, cooperative, no distress and moderately obese; very slow mentation @ baseline Lungs: decreased breath sounds at bases; non-labored while sitting up Heart: irregularly irregular rhythm - otw NL (distant S1S2), no obvious M/R/G; + JVP noticeable ~ 12-14 cmH20  (~ TR & Afib related) Abd: obese, protuberant, NABS Extr: no obvious edema. 2+ pulses.   Rate: 90-120  Rhythm: atrial fibrillation and LBBB  Lab Results:  Recent Labs  05/31/13 1611 06/01/13 0820  WBC 10.9* 10.1  HGB 11.7* 11.5*  PLT 221 212    Recent Labs  05/31/13 1611 06/01/13 0820  NA 139 133*  K 3.9 4.5  CL 105 98  CO2 21 23  GLUCOSE 80 233*  BUN 18 18  CREATININE 1.15 1.40*    Recent Labs  06/01/13 0100 06/01/13 0820  TROPONINI <0.30 <0.30    Recent Labs  06/01/13 0820  INR 1.10    Imaging: Imaging results have been reviewed  Cardiac Studies:  Assessment/Plan:   Principal Problem:   Atrial fibrillation with RVR Active Problems:   Acute on chronic diastolic heart failure - due to Afib AVR   Stroke- Lt brain 2003   Hemiplegia affecting right dominant side   GERD (gastroesophageal reflux disease)   S/P CABG x 3 - 2010   S/P AVR-tissue, 2010   DM2 (diabetes mellitus, type 2)   Dyslipidemia   LBBB (left bundle branch block)   Hypotension- secondary to AF  and Diltiazem Rx   Obesity (BMI 30-39.9)  PLAN: Discussed with Dr [name]. Recent onset new AF. He has been SOB for 3 days, worse last 24 hrs. B/P is soft with AF and IV Diltiazem. Plan Heparin (ordered), decrease Diltiazem, hold ACE, start IV Amiodarone with slow load. Change low dose Coreg to Metoprolol 12.5 mg BID. No TEE CV planned for today.  Will hold off on echo till HR controlled. Continue daily diuretic- Lasix 40 mg given on admission, start daily po dose in am.   [name] Beeper [phone] 06/01/2013, 8:23 AM  I have seen and evaluated the patient this PM along with Rickey Shelter, PA. I agree with his findings, examination as well as impression recommendations.  67 y/o obese pt of Dr. Rennis [name] with CAD-CABG & AVR (2013) & h/o CVA, known LBBB & ~brittel DM-2 (on high doses of insulin) + obesity who presented with acute onset dyspnea & palpitations - found to be in Afib RVR (difficult to ascertain irregular WCT with LBBB).  Recent Echo - normal EF. Was doing well in Aug 2014.  Given IV Diltiazem in ER- modest HR response, but became hypotensive.  Also given IV Lasix with brisk UOP -- suspect Afib related A on C Diastolic HF. Rate has improved with IV diltiazem, but BP is limiting aggressive control.  May need more lasix, but will hold off for now. Restarting home Torsemide would be reasonable in the AM.  On IV Heparin -- given his level of intolerance & borderline BP, will use Amiodarone IV load in an effort to chemically cardiovert. - slow IV blous (1 hr) holding Diltiazem during load.  Will need long term AC (CHADS2VASc2 score 6-7), but with new onset Afib, will need to assess for ischemic etiology -- if potential for cath, will hold off on starting -- Eliquis may be a good option & hold ASA.  Agree with changing BB to Metoprolol (better rate control than BP effect).  Will check echo once HR has stabilized - if new WMA, would suggest potential ischemic etiology. No angina  though  Glycemic control will be difficult - only put on ~1/2 of home insulin regimen (if he is here for a prolonged period of time, will need DM management consult to assist); I think the lower dose was due to ~ NPO status.  Unlikely to get TEE DCCV over the weekend.  Home Thyroid replacement dose.  MD Time with pt: 30 min  [name],Rickey W, M.D., M.S. [facility] [address] [place], [place]  [address]  [phone] Pager # [phone] 06/01/2013 1:37 PM

## 2013-06-01 NOTE — Progress Notes (Signed)
ANTICOAGULATION CONSULT NOTE - Follow Up Consult  Pharmacy Consult for heparin Indication: atrial fibrillation  No Known Allergies  Patient Measurements: Height: 5' 10.5" (179.1 cm) Weight: 255 lb 4.7 oz (115.8 kg) IBW/kg (Calculated) : 74.15 Heparin Dosing Weight: 100 kg  Vital Signs: Temp: 98.1 F (36.7 C) (10/18 1631) Temp src: Oral (10/18 1631) BP: 125/40 mmHg (10/18 1631) Pulse Rate: 63 (10/18 1631)  Labs:  Recent Labs  05/31/13 1611 06/01/13 0100 06/01/13 0820 06/01/13 1400 06/01/13 1633  HGB 11.7*  --  11.5*  --   --   HCT 35.3*  --  35.0*  --   --   PLT 221  --  212  --   --   LABPROT  --   --  14.0  --   --   INR  --   --  1.10  --   --   HEPARINUNFRC  --   --  0.24*  --  0.22*  CREATININE 1.15  --  1.40*  --   --   TROPONINI  --  <0.30 <0.30 <0.30  --     Estimated Creatinine Clearance: 67.6 ml/min (by C-G formula based on Cr of 1.4).   Medications:  Scheduled:  . aspirin  81 mg Oral Daily  . atorvastatin  20 mg Oral q1800  . furosemide  40 mg Intravenous Daily  . gabapentin  300 mg Oral TID  . [START ON 06/02/2013] influenza vac split quadrivalent PF  0.5 mL Intramuscular Tomorrow-1000  . insulin aspart  25 Units Subcutaneous TID WC  . insulin glargine  35 Units Subcutaneous BID  . levothyroxine  25 mcg Oral QAC breakfast  . metoprolol tartrate  12.5 mg Oral BID  . [START ON 06/02/2013] pneumococcal 23 valent vaccine  0.5 mL Intramuscular Tomorrow-1000  . potassium chloride  20 mEq Oral Daily  . sodium chloride  3 mL Intravenous Q12H   Infusions:  . amiodarone (NEXTERONE PREMIX) 360 mg/200 mL dextrose 60 mg/hr (06/01/13 1600)   Followed by  . amiodarone (NEXTERONE PREMIX) 360 mg/200 mL dextrose    . diltiazem (CARDIZEM) infusion 5 mg/hr (06/01/13 1600)  . heparin 1,800 Units/hr (06/01/13 1600)    Assessment: 67 yo male with afib is currently on subtherapeutic heparin.  Heparin level is 0.22.  Per RN, the heparin is currently running at 1800  units/hr  Goal of Therapy:  Heparin level 0.3-0.7 units/ml Monitor platelets by anticoagulation protocol: Yes   Plan:  1) Increase heparin to 2050 units/hr 2) Check a 6hr heparin level.  [name], Tsz-Yin 06/01/2013,5:15 PM

## 2013-06-01 NOTE — Progress Notes (Signed)
ANTICOAGULATION CONSULT NOTE - Follow Up Consult  Pharmacy Consult for heparin Indication: atrial fibrillation  No Known Allergies  Patient Measurements: Height: 5' 10.5" (179.1 cm) Weight: 255 lb 4.7 oz (115.8 kg) IBW/kg (Calculated) : 74.15 Heparin Dosing Weight: 100 kg  Vital Signs: Temp: 97.6 F (36.4 C) (10/18 0730) Temp src: Oral (10/18 0730) BP: 99/79 mmHg (10/18 1000) Pulse Rate: 100 (10/18 0900)  Labs:  Recent Labs  05/31/13 1611 06/01/13 0100 06/01/13 0820  HGB 11.7*  --  11.5*  HCT 35.3*  --  35.0*  PLT 221  --  212  LABPROT  --   --  14.0  INR  --   --  1.10  HEPARINUNFRC  --   --  0.24*  CREATININE 1.15  --  1.40*  TROPONINI  --  <0.30 <0.30    Estimated Creatinine Clearance: 67.6 ml/min (by C-G formula based on Cr of 1.4).   Medications:  Scheduled:  . amiodarone  150 mg Intravenous Once  . aspirin  81 mg Oral Daily  . furosemide  40 mg Intravenous Daily  . gabapentin  300 mg Oral TID  . [START ON 06/02/2013] influenza vac split quadrivalent PF  0.5 mL Intramuscular Tomorrow-1000  . insulin aspart  25 Units Subcutaneous TID WC  . insulin glargine  35 Units Subcutaneous BID  . levothyroxine  25 mcg Oral QAC breakfast  . metoprolol tartrate  12.5 mg Oral BID  . [START ON 06/02/2013] pneumococcal 23 valent vaccine  0.5 mL Intramuscular Tomorrow-1000  . potassium chloride  20 mEq Oral Daily  . simvastatin  40 mg Oral QHS  . sodium chloride  3 mL Intravenous Q12H   Infusions:  . amiodarone (NEXTERONE PREMIX) 360 mg/200 mL dextrose     Followed by  . amiodarone (NEXTERONE PREMIX) 360 mg/200 mL dextrose    . diltiazem (CARDIZEM) infusion 5 mg/hr (06/01/13 0955)  . heparin 1,600 Units/hr (06/01/13 0600)    Assessment: 67 yo male with afib is currently on subtherapeutic heparin.  Heparin level was 0.24.  Hgb 11.5 and Plt 212 K Goal of Therapy:  Heparin level 0.3-0.7 units/ml Monitor platelets by anticoagulation protocol: Yes   Plan:  1)  Increase heparin 1800 units/hr. Check a 6hr heparin level after drip rate is changed 2) Daily heparin level and CBC   Melody Savidge, Tsz-Yin 06/01/2013,10:17 AM

## 2013-06-02 DIAGNOSIS — I4892 Unspecified atrial flutter: Secondary | ICD-10-CM

## 2013-06-02 DIAGNOSIS — N179 Acute kidney failure, unspecified: Secondary | ICD-10-CM

## 2013-06-02 DIAGNOSIS — E875 Hyperkalemia: Secondary | ICD-10-CM | POA: Diagnosis not present

## 2013-06-02 LAB — BASIC METABOLIC PANEL
BUN: 24 mg/dL — ABNORMAL HIGH (ref 6–23)
CO2: 26 mEq/L (ref 19–32)
Calcium: 8.7 mg/dL (ref 8.4–10.5)
Calcium: 9.1 mg/dL (ref 8.4–10.5)
Creatinine, Ser: 1.8 mg/dL — ABNORMAL HIGH (ref 0.50–1.35)
GFR calc Af Amer: 44 mL/min — ABNORMAL LOW (ref >90)
GFR calc Af Amer: 44 mL/min — ABNORMAL LOW (ref >90)
GFR calc non Af Amer: 38 mL/min — ABNORMAL LOW (ref >90)
GFR calc non Af Amer: 38 mL/min — ABNORMAL LOW (ref >90)
Glucose, Bld: 307 mg/dL — ABNORMAL HIGH (ref 70–99)
Potassium: 5.7 mEq/L — ABNORMAL HIGH (ref 3.5–5.1)
Sodium: 134 mEq/L — ABNORMAL LOW (ref 135–145)

## 2013-06-02 LAB — CBC
HCT: 36.1 % — ABNORMAL LOW (ref 39.0–52.0)
Hemoglobin: 12.4 g/dL — ABNORMAL LOW (ref 13.0–17.0)
MCH: 28.2 pg (ref 26.0–34.0)
MCHC: 34.3 g/dL (ref 30.0–36.0)
MCV: 82.2 fL (ref 78.0–100.0)
Platelets: 219 10*3/uL (ref 150–400)
RBC: 4.39 MIL/uL (ref 4.22–5.81)
RDW: 13.9 % (ref 11.5–15.5)
WBC: 12.2 10*3/uL — ABNORMAL HIGH (ref 4.0–10.5)

## 2013-06-02 LAB — GLUCOSE, CAPILLARY
Glucose-Capillary: 253 mg/dL — ABNORMAL HIGH (ref 70–99)
Glucose-Capillary: 392 mg/dL — ABNORMAL HIGH (ref 70–99)
Glucose-Capillary: 304 mg/dL — ABNORMAL HIGH (ref 70–99)

## 2013-06-02 LAB — PROTIME-INR
INR: 1.09 (ref 0.00–1.49)
Prothrombin Time: 13.9 seconds (ref 11.6–15.2)

## 2013-06-02 LAB — HEPARIN LEVEL (UNFRACTIONATED): Heparin Unfractionated: 0.35 IU/mL (ref 0.30–0.70)

## 2013-06-02 LAB — MAGNESIUM: Magnesium: 2 mg/dL (ref 1.5–2.5)

## 2013-06-02 LAB — TSH: TSH: 2.55 u[IU]/mL (ref 0.350–4.500)

## 2013-06-02 LAB — PRO B NATRIURETIC PEPTIDE: Pro B Natriuretic peptide (BNP): 2717 pg/mL — ABNORMAL HIGH (ref 0–125)

## 2013-06-02 MED ORDER — SODIUM POLYSTYRENE SULFONATE 15 GM/60ML PO SUSP
15.0000 g | Freq: Once | ORAL | Status: AC
  Administered 2013-06-02: 15 g via ORAL
  Filled 2013-06-02: qty 60

## 2013-06-02 MED ORDER — INSULIN ASPART 100 UNIT/ML ~~LOC~~ SOLN
0.0000 [IU] | Freq: Three times a day (TID) | SUBCUTANEOUS | Status: DC
  Administered 2013-06-02: 15 [IU] via SUBCUTANEOUS
  Administered 2013-06-02: 20 [IU] via SUBCUTANEOUS
  Administered 2013-06-03: 7 [IU] via SUBCUTANEOUS
  Administered 2013-06-03: 3 [IU] via SUBCUTANEOUS
  Administered 2013-06-03 – 2013-06-04 (×3): 7 [IU] via SUBCUTANEOUS
  Administered 2013-06-04: 4 [IU] via SUBCUTANEOUS
  Administered 2013-06-05: 11 [IU] via SUBCUTANEOUS
  Administered 2013-06-05: 14:00:00 via SUBCUTANEOUS
  Administered 2013-06-06: 4 [IU] via SUBCUTANEOUS
  Administered 2013-06-06: 11 [IU] via SUBCUTANEOUS
  Administered 2013-06-06: 4 [IU] via SUBCUTANEOUS
  Administered 2013-06-07: 7 [IU] via SUBCUTANEOUS
  Administered 2013-06-07: 4 [IU] via SUBCUTANEOUS

## 2013-06-02 MED ORDER — AMIODARONE HCL IN DEXTROSE 360-4.14 MG/200ML-% IV SOLN
30.0000 mg/h | INTRAVENOUS | Status: DC
  Administered 2013-06-03: 30 mg/h via INTRAVENOUS
  Filled 2013-06-02 (×2): qty 200

## 2013-06-02 MED ORDER — AMIODARONE HCL 150 MG/3ML IV SOLN
150.0000 mg | Freq: Once | INTRAVENOUS | Status: DC
  Filled 2013-06-02: qty 3

## 2013-06-02 MED ORDER — AMIODARONE LOAD VIA INFUSION
150.0000 mg | Freq: Once | INTRAVENOUS | Status: AC
  Administered 2013-06-02: 150 mg via INTRAVENOUS
  Filled 2013-06-02: qty 83.34

## 2013-06-02 MED ORDER — SODIUM CHLORIDE 0.9 % IV BOLUS (SEPSIS)
250.0000 mL | Freq: Once | INTRAVENOUS | Status: AC
  Administered 2013-06-02: 250 mL via INTRAVENOUS

## 2013-06-02 MED ORDER — AMIODARONE HCL IN DEXTROSE 360-4.14 MG/200ML-% IV SOLN: 60.0000 mg/h | INTRAVENOUS | Status: AC

## 2013-06-02 MED ORDER — INSULIN ASPART 100 UNIT/ML ~~LOC~~ SOLN
15.0000 [IU] | Freq: Once | SUBCUTANEOUS | Status: AC
  Administered 2013-06-02: 15 [IU] via SUBCUTANEOUS

## 2013-06-02 MED ORDER — INSULIN ASPART 100 UNIT/ML ~~LOC~~ SOLN
30.0000 [IU] | Freq: Three times a day (TID) | SUBCUTANEOUS | Status: DC
  Administered 2013-06-02 – 2013-06-04 (×8): 30 [IU] via SUBCUTANEOUS

## 2013-06-02 MED ORDER — INSULIN GLARGINE 100 UNIT/ML ~~LOC~~ SOLN
45.0000 [IU] | Freq: Two times a day (BID) | SUBCUTANEOUS | Status: DC
  Administered 2013-06-02 – 2013-06-04 (×4): 45 [IU] via SUBCUTANEOUS
  Filled 2013-06-02 (×5): qty 0.45

## 2013-06-02 MED ORDER — INSULIN ASPART 100 UNIT/ML ~~LOC~~ SOLN: 15.0000 [IU] | Freq: Every day | SUBCUTANEOUS | Status: DC

## 2013-06-02 NOTE — Progress Notes (Signed)
ANTICOAGULATION CONSULT NOTE Pharmacy Consult for Heparin Indication: atrial fibrillation  No Known Allergies  Patient Measurements: Height: 5' 10.5" (179.1 cm) Weight: 255 lb 4.7 oz (115.8 kg) IBW/kg (Calculated) : 74.15 Heparin Dosing Weight: 100 kg  Vital Signs: Temp: 98.2 F (36.8 C) (10/19 0000) Temp src: Oral (10/19 0000) BP: 106/65 mmHg (10/19 0000) Pulse Rate: 80 (10/19 0000)  Labs:  Recent Labs  05/31/13 1611 06/01/13 0100 06/01/13 0820 06/01/13 1400 06/01/13 1633 06/01/13 2345  HGB 11.7*  --  11.5*  --   --  12.4*  HCT 35.3*  --  35.0*  --   --  36.1*  PLT 221  --  212  --   --  219  LABPROT  --   --  14.0  --   --   --   INR  --   --  1.10  --   --   --   HEPARINUNFRC  --   --  0.24*  --  0.22* 0.42  CREATININE 1.15  --  1.40*  --   --   --   TROPONINI  --  <0.30 <0.30 <0.30  --   --     Estimated Creatinine Clearance: 67.6 ml/min (by C-G formula based on Cr of 1.4).  Assessment: 67 yo male with Afib for heparin  Goal of Therapy:  Heparin level 0.3-0.7 units/ml Monitor platelets by anticoagulation protocol: Yes   Plan:  Continue Heparin at current rate    Desha Bitner, Gary Fleet 06/02/2013,12:35 AM

## 2013-06-02 NOTE — Progress Notes (Signed)
06/02/13 1711 06/02/13 1751  Vitals  BP 109/68 mmHg --   MAP (mmHg) 81 --   Pulse Rate --  ! 138  ECG Heart Rate --  ! 137  Md called re above VS. Pt hr sustained. Asymptomatic. Orders received. See mar. Will continue to monitor and advise attending as needed.

## 2013-06-02 NOTE — Progress Notes (Signed)
Patient ID: Nonah Mattes, male   DOB: 01-Aug-1946, 67 y.o.   MRN: [id number] Subjective:  Transferred to TCU yesterday -- apparently converted to NSR (was acutally 3:1 Flutter) for a while last PM - now in a slow 1:1 Flutter ~110-120 Says he feels better & is breathing better.  Objective:  Vital Signs in the last 24 hours: Temp:  [97.6 F (36.4 C)-98.2 F (36.8 C)] 97.7 F (36.5 C) (10/19 0700) Pulse Rate:  [39-141] 80 (10/19 0700) Resp:  [18-31] 26 (10/19 0700) BP: (62-126)/(37-90) 91/41 mmHg (10/19 0700) SpO2:  [93 %-100 %] 97 % (10/19 0700) Weight:  [251 lb 1.7 oz (113.9 kg)] 251 lb 1.7 oz (113.9 kg) (10/19 0500)  Intake/Output from previous day:  Intake/Output Summary (Last 24 hours) at 06/02/13 0950 Last data filed at 06/02/13 0700  Gross per 24 hour  Intake 1725.22 ml  Output   1200 ml  Net 525.22 ml    Physical Exam: General appearance: alert, cooperative, no distress and moderately obese; very slow mentation @ baseline Lungs: decreased breath sounds at bases; non-labored while sitting up Heart: Rapid, Regular rhythm - otw NL (distant S1S2), no obvious M/R/G; + JVP noticeable ~ 12-cmH20   Abd: obese, protuberant, NABS Extr: no obvious edema. 2+ pulses.   Rate:   Rhythm:   Lab Results:  Recent Labs  06/01/13 0820 06/01/13 2345  WBC 10.1 12.2*  HGB 11.5* 12.4*  PLT 212 219    Recent Labs  06/01/13 0820 06/02/13 0540  NA 133* 132*  K 4.5 5.7*  CL 98 96  CO2 23 22  GLUCOSE 233* 307*  BUN 18 24*  CREATININE 1.40* 1.80*    Recent Labs  06/01/13 0820 06/01/13 1400  TROPONINI <0.30 <0.30    Recent Labs  06/02/13 0540  INR 1.09    Imaging: Imaging results have been reviewed  Cardiac Studies:  Assessment/Plan:   Principal Problem:   Atrial fibrillation with RVR Active Problems:   Acute on chronic diastolic heart failure - due to Afib AVR   Atrial flutter with rapid ventricular response   Acute renal failure: Cr up to 1.8  Hyperkalemia   Stroke- Lt brain 2003   Hemiplegia affecting right dominant side   GERD (gastroesophageal reflux disease)   S/P CABG x 3 - 2010   S/P AVR-tissue, 2010   DM2 (diabetes mellitus, type 2)   Dyslipidemia   LBBB (left bundle branch block)   Hypotension- secondary to AF and Diltiazem Rx   Obesity (BMI 30-39.9)  67 y/o obese pt of Dr. [name] with CAD-CABG & AVR (2013) & h/o CVA, known LBBB & ~brittel DM-2 (on high doses of insulin) + obesity who presented with acute onset dyspnea & palpitations - found to be in Afib RVR (difficult to ascertain irregular WCT with LBBB).  Recent Echo - normal EF. Was doing well in Aug 2014.  Given IV Diltiazem in ER- modest HR response, but became hypotensive.  Also given IV Lasix with brisk UOP -- suspect Afib related A on C Diastolic HF. Rate has improved with IV diltiazem, but BP has been limiting aggressive control. Moved to TCU for Amiodarone - short spell of NSR ~11PM then reverted to A Flutter - currently  ~110-119. I/O actually net even despite IV Lasix, but renal function is worsening & K now 5.7 - probably related to hypotension & decreased CO due to AFib/Flutter.    Plan:   Kayexalate, 250 ml NS followed by IV Lasix fof K+  Continue IV Heparin.  Will rebolus IV Amiodarone in an attempt to chemically cardiovert, if unsuccessful, may need to consider TEE/DCCV tomorrow (will try to schedule)  As BP / borderline hypotension has been a concern, have d/c'd Diltiazem.  Will need long term AC (CHADS2VASc2 score 6-7), but with new onset Afib, will need to assess for ischemic etiology -- if potential for cath, will hold off on starting -- Eliquis may be a good option & hold ASA.  Agree with changing BB to Metoprolol (better rate control than BP effect).  Will check echo once HR has stabilized - if new WMA, would suggest potential ischemic etiology. No angina though  Glycemic control will is difficult - only put on ~1/2 of home insulin  regimen; Have increased Lantus to 45 Untis BID (takes 70 AM & 60 PM @ home), will probably need to increase meal coverage as well. - will consult DM team to help with  Home Thyroid replacement dose (would not want to use Amiodarone as long term Rx - using as interim agent), may need to consider Sotalol or Tikosyn if this becomes a recurrent issue.     Marykay Lex, M.D., M.S. [facility] [address] [place], [place]  [address]  [phone] Pager # [phone] 06/02/2013 9:50 AM

## 2013-06-02 NOTE — ED Provider Notes (Signed)
I saw and evaluated the patient, reviewed the resident's note and I agree with the findings and plan. Patient with history of coronary artery disease presents with dyspnea x2 days. Found to be in A. fib with RVR. Managed initially with Cardizem drip and IV fluids. Initial improvement of blood pressure and heart rate. Cardiology involved and will admit.  I reviewed and agree with the resident's EKG evaluation.  Loren Racer, MD 06/02/13 [phone]

## 2013-06-02 NOTE — Progress Notes (Signed)
ANTICOAGULATION CONSULT NOTE - Follow Up Consult  Pharmacy Consult for heparin Indication: atrial fibrillation  No Known Allergies  Patient Measurements: Height: 5' 10.5" (179.1 cm) Weight: 251 lb 1.7 oz (113.9 kg) IBW/kg (Calculated) : 74.15 Heparin Dosing Weight: 100 kg  Vital Signs: Temp: 97.6 F (36.4 C) (10/19 1100) Temp src: Oral (10/19 1100) BP: 108/54 mmHg (10/19 1100) Pulse Rate: 80 (10/19 0700)  Labs:  Recent Labs  05/31/13 1611 06/01/13 0100  06/01/13 0820 06/01/13 1400 06/01/13 1633 06/01/13 2345 06/02/13 0540  HGB 11.7*  --   --  11.5*  --   --  12.4*  --   HCT 35.3*  --   --  35.0*  --   --  36.1*  --   PLT 221  --   --  212  --   --  219  --   LABPROT  --   --   --  14.0  --   --   --  13.9  INR  --   --   --  1.10  --   --   --  1.09  HEPARINUNFRC  --   --   < > 0.24*  --  0.22* 0.42 0.35  CREATININE 1.15  --   --  1.40*  --   --   --  1.80*  TROPONINI  --  <0.30  --  <0.30 <0.30  --   --   --   < > = values in this interval not displayed.  Estimated Creatinine Clearance: 52.1 ml/min (by C-G formula based on Cr of 1.8).   Medications:  Scheduled:  . aspirin  81 mg Oral Daily  . atorvastatin  20 mg Oral q1800  . furosemide  40 mg Intravenous Daily  . gabapentin  300 mg Oral TID  . insulin aspart  0-20 Units Subcutaneous TID WC  . insulin aspart  30 Units Subcutaneous TID WC  . insulin glargine  45 Units Subcutaneous BID  . levothyroxine  25 mcg Oral QAC breakfast  . metoprolol tartrate  12.5 mg Oral BID  . sodium chloride  3 mL Intravenous Q12H   Infusions:  . amiodarone (NEXTERONE PREMIX) 360 mg/200 mL dextrose 30 mg/hr (06/02/13 1302)  . heparin 2,050 Units/hr (06/02/13 1300)    Assessment:  67 yo M admitted 05/31/2013  With Afib. Pharmacy consulted to dose heparin.  Anticoagulation: new onset afib. Heparin level at goal, CBC stable, no bleeding noted  Cardiovascular CAD s/p CABG. S/p tissue AVRr Htn HLD. New onset afib. No TEE CV  planned yet. Will hold off till HR is controlled.  BP soft and HR 40-140 ASA, zocor, and lasix. Dilt decreased; holding ACEI. IV amio. Coreg changed to metoprolol.   Endo/GI: DM Hypothyroid. CBG up to 400. On lantus and SSI  Neurology CVA Nephrology: CrCl ~50, SCr trend up;  K 5.7, Na 132,  K supplement stopped  PTA Medication Issues: fenofibrate, lisinopril (on hold) Best Practices: heparin  Goal of Therapy:  Heparin level 0.3-0.7 units/ml Monitor platelets by anticoagulation protocol: Yes   Plan:  1) Continue heparin at 2050 units/hr 2) Follow up daily HL   Thank you for allowing pharmacy to be a part of this patients care team.  Lovenia Kim Pharm.D., BCPS Clinical Pharmacist 06/02/2013 2:24 PM Pager: [phone] Phone: [phone]

## 2013-06-03 DIAGNOSIS — I517 Cardiomegaly: Secondary | ICD-10-CM

## 2013-06-03 HISTORY — PX: TRANSTHORACIC ECHOCARDIOGRAM: SHX275

## 2013-06-03 LAB — BASIC METABOLIC PANEL
CO2: 26 mEq/L (ref 19–32)
Chloride: 97 mEq/L (ref 96–112)
Creatinine, Ser: 1.46 mg/dL — ABNORMAL HIGH (ref 0.50–1.35)
GFR calc Af Amer: 56 mL/min — ABNORMAL LOW (ref >90)
Glucose, Bld: 218 mg/dL — ABNORMAL HIGH (ref 70–99)
Potassium: 4.5 mEq/L (ref 3.5–5.1)

## 2013-06-03 LAB — HEPARIN LEVEL (UNFRACTIONATED): Heparin Unfractionated: 0.34 IU/mL (ref 0.30–0.70)

## 2013-06-03 LAB — PROTIME-INR
INR: 1.07 (ref 0.00–1.49)
Prothrombin Time: 13.7 seconds (ref 11.6–15.2)

## 2013-06-03 LAB — MAGNESIUM: Magnesium: 2.2 mg/dL (ref 1.5–2.5)

## 2013-06-03 LAB — GLUCOSE, CAPILLARY
Glucose-Capillary: 233 mg/dL — ABNORMAL HIGH (ref 70–99)
Glucose-Capillary: 141 mg/dL — ABNORMAL HIGH (ref 70–99)

## 2013-06-03 SURGERY — Surgical Case
Anesthesia: *Unknown

## 2013-06-03 MED ORDER — AMIODARONE HCL 200 MG PO TABS: 400.0000 mg | ORAL_TABLET | Freq: Every day | ORAL | Status: DC

## 2013-06-03 MED ORDER — AMIODARONE HCL 200 MG PO TABS: 200.0000 mg | ORAL_TABLET | Freq: Every day | ORAL | Status: DC

## 2013-06-03 MED ORDER — PERFLUTREN LIPID MICROSPHERE
INTRAVENOUS | Status: AC
  Administered 2013-06-03: 12:00:00
  Filled 2013-06-03: qty 10

## 2013-06-03 MED ORDER — FUROSEMIDE 40 MG PO TABS
40.0000 mg | ORAL_TABLET | Freq: Every day | ORAL | Status: DC
  Administered 2013-06-03: 40 mg via ORAL
  Filled 2013-06-03 (×2): qty 1

## 2013-06-03 MED ORDER — APIXABAN 5 MG PO TABS
5.0000 mg | ORAL_TABLET | Freq: Two times a day (BID) | ORAL | Status: DC
  Administered 2013-06-03 (×2): 5 mg via ORAL
  Filled 2013-06-03 (×4): qty 1

## 2013-06-03 MED ORDER — AMIODARONE HCL 200 MG PO TABS
400.0000 mg | ORAL_TABLET | Freq: Two times a day (BID) | ORAL | Status: DC
  Administered 2013-06-03 – 2013-06-07 (×9): 400 mg via ORAL
  Filled 2013-06-03 (×11): qty 2

## 2013-06-03 MED ORDER — HEPARIN (PORCINE) IN NACL 100-0.45 UNIT/ML-% IJ SOLN: 2050.0000 [IU]/h | INTRAMUSCULAR | Status: AC

## 2013-06-03 NOTE — Care Management Note (Addendum)
    Page 1 of 2   06/06/2013     10:40:19 AM   CARE MANAGEMENT NOTE 06/06/2013  Patient:  [name], [name]   Account Number:  [id number]  Date Initiated:  06/03/2013  Documentation initiated by:  Junius Creamer  Subjective/Objective Assessment:   adm w at fib w rvr     Action/Plan:   lives alone, pcp dr Lyman Bishop fusco   Anticipated DC Date:     Anticipated DC Plan:        DC Planning Services  CM consult  Medication Assistance      Choice offered to / List presented to:     DME arranged  VEST - LIFE VEST      DME agency  OTHER - SEE NOTE        Status of service:   Medicare Important Message given?   (If response is "NO", the following Medicare IM given date fields will be blank) Date Medicare IM given:   Date Additional Medicare IM given:    Discharge Disposition:    Per UR Regulation:  Reviewed for med. necessity/level of care/duration of stay  If discussed at Long Length of Stay Meetings, dates discussed:   06/06/2013    Comments:  06-05-13 [address]- Mitzie Na, RN, BSN [phone] CM did call Dorothea Ogle  liaison for [employer] to make her aware that pt  will need Life vest. CM spoke to Nada Boozer and she will submit the paperwork to [employer]. Possible plan for d/c tomorrow. CM will conitnue to monitor for additional needs.  06-05-13 1547 Tomi Bamberger, [place] [phone] Plan for continue with lasix tid. CM will contineu to monitor for disposition needs.   10/21  1001 debbie dowell rn,bsn spoke w pt to alert him that copay may be 70.00 per month. he is not sure he could afford that copay.  10/20  0935  debbie dowell rn,bsn will give pt 30day free eliquis card. per cm sec has 70.00 per month copay at preferred pharm and 95.00 per month for  nonpreferred.

## 2013-06-03 NOTE — Progress Notes (Signed)
ANTICOAGULATION CONSULT NOTE - Follow Up Consult  Pharmacy Consult for heparin>>apixaban Indication: atrial fibrillation  No Known Allergies  Patient Measurements: Height: 5' 10.5" (179.1 cm) Weight: 253 lb 15.5 oz (115.2 kg) IBW/kg (Calculated) : 74.15 Heparin Dosing Weight: 100 kg  Vital Signs: Temp: 98.1 F (36.7 C) (10/20 0745) Temp src: Oral (10/20 0745) BP: 116/57 mmHg (10/20 0745) Pulse Rate: 82 (10/20 0745)  Labs:  Recent Labs  05/31/13 1611 06/01/13 0100 06/01/13 0820 06/01/13 1400  06/01/13 2345 06/02/13 0540 06/02/13 1500 06/03/13 0430  HGB 11.7*  --  11.5*  --   --  12.4*  --   --   --   HCT 35.3*  --  35.0*  --   --  36.1*  --   --   --   PLT 221  --  212  --   --  219  --   --   --   LABPROT  --   --  14.0  --   --   --  13.9  --  13.7  INR  --   --  1.10  --   --   --  1.09  --  1.07  HEPARINUNFRC  --   --  0.24*  --   < > 0.42 0.35  --  0.34  CREATININE 1.15  --  1.40*  --   --   --  1.80* 1.81* 1.46*  TROPONINI  --  <0.30 <0.30 <0.30  --   --   --   --   --   < > = values in this interval not displayed.  Estimated Creatinine Clearance: 64.6 ml/min (by C-G formula based on Cr of 1.46).   Medications:  Scheduled:  . [START ON 06/15/2013] amiodarone  200 mg Oral Daily  . amiodarone  400 mg Oral BID  . [START ON 06/08/2013] amiodarone  400 mg Oral Daily  . apixaban  5 mg Oral BID  . aspirin  81 mg Oral Daily  . atorvastatin  20 mg Oral q1800  . furosemide  40 mg Oral Daily  . gabapentin  300 mg Oral TID  . insulin aspart  0-20 Units Subcutaneous TID WC  . insulin aspart  30 Units Subcutaneous TID WC  . insulin glargine  45 Units Subcutaneous BID  . levothyroxine  25 mcg Oral QAC breakfast  . metoprolol tartrate  12.5 mg Oral BID  . sodium chloride  3 mL Intravenous Q12H   Infusions:  . heparin      Assessment:  67 yo M admitted 05/31/2013  With Afib. Pharmacy consulted to dose heparin.  Anticoagulation: new onset afib. Heparin level at  goal, CBC stable, no bleeding noted. Patient getting ready for discharge plan to transition to po apixaban this morning and stop the heparin once given. Will discuss medication with patient.  Goal of Therapy:  Heparin level 0.3-0.7 units/ml Monitor platelets by anticoagulation protocol: Yes   Plan:  1) Stop heparin this morning - start apixaban 5mg  big - no dose adj needed. 2) Apixaban education  Thank you for allowing pharmacy to be a part of this patients care team.  Sheppard Coil PharmD., BCPS Clinical Pharmacist Pager [phone] 06/03/2013 9:22 AM

## 2013-06-03 NOTE — Progress Notes (Signed)
Pt. Seen and examined. Agree with the NP/PA-C note as written.  Pleasant 67 yo patient of mine with a history of CABG and tissue AVR in 2010. Now presents with hypotension and ARF in the setting of A-fib - he converted to atrial flutter and was loaded on amiodarone and ultimately converted to NSR with LBBB, 1st degree AVB.  He feels much better today. Renal function is improving - ACE-I being held.  Plan to convert to po amiodarone today. Will need anticoagulation due to [facility] score of 4.  I would recommend Eliquis 5 mg BID. Ok to eat diet today. Repeat echocardiogram today to assess EF, bioprosthetic valve and for any possible new wall motion abnormalities.  Chrystie Nose, MD, [facility] Attending Cardiologist [facility]

## 2013-06-03 NOTE — Progress Notes (Signed)
Echocardiogram 2D Echocardiogram with definity has been performed.  [name] 06/03/2013, 12:26 PM

## 2013-06-03 NOTE — Progress Notes (Signed)
Inpatient Diabetes Program Recommendations  AACE/ADA: New Consensus Statement on Inpatient Glycemic Control (2013)  Target Ranges:  Prepandial:   less than 140 mg/dL      Peak postprandial:   less than 180 mg/dL (1-2 hours)      Critically ill patients:  140 - 180 mg/dL   Reason for Visit: Diabetes Consult  Pt states he checks blood sugars at home 3-4 times/day and gives himself 5 insulin shots/day.  Dr. Lurene Shadow is endo.  Last HgbA1C several months ago was 7.5% according to pt. Very little exercise and states "walking to the mailbox makes me SOB." Has attended diabetes classes in the past. Does not follow CHO mod diet at home, but tries to stay away from sweets. Rarely has hypoglycemia. Eating 100% meals.  Results for [name], [name] (MRN [id number]) as of 06/03/2013 15:31  Ref. Range 06/02/2013 11:55 06/02/2013 16:32 06/02/2013 22:00 06/03/2013 07:53 06/03/2013 12:31  Glucose-Capillary Latest Range: 70-99 mg/dL 409 (H) 811 (H) 914 (H) 240 (H) 233 (H)  Results for [name], [name] (MRN [id number]) as of 06/03/2013 15:31  Ref. Range 06/01/2013 08:20  Hemoglobin A1C Latest Range: <5.7 % 10.1 (H)    Inpatient Diabetes Program Recommendations Insulin - Basal: Increase Lantus to 55 units bid Insulin - Meal Coverage: Increase Novolog to 35 units tidwc for meal coverage insulin HgbA1C: 10.1% Diet: Add CHO mod med to heart healthy diet  Note: Encouraged pt to view diabetes videos on pt ed channel. Does not want to attend diabetes classes at this point since he has attended several times in the past. Pt to f/u with Dr. Lurene Shadow for diabetes management after discharge.  Will follow while inpatient. Thank you. Ailene Ards, RD, LDN, CDE Inpatient Diabetes Coordinator [phone]

## 2013-06-03 NOTE — Progress Notes (Signed)
Subjective:  Feels better today- "ready to go home"  Objective:  Vital Signs in the last 24 hours: Temp:  [97 F (36.1 C)-98.6 F (37 C)] 98.1 F (36.7 C) (10/20 0745) Pulse Rate:  [75-141] 82 (10/20 0745) Resp:  [19-28] 19 (10/20 0745) BP: (87-116)/(47-69) 116/57 mmHg (10/20 0745) SpO2:  [97 %-100 %] 100 % (10/20 0745) Weight:  [253 lb 15.5 oz (115.2 kg)] 253 lb 15.5 oz (115.2 kg) (10/20 0500)  Intake/Output from previous day:  Intake/Output Summary (Last 24 hours) at 06/03/13 0811 Last data filed at 06/03/13 0600  Gross per 24 hour  Intake 3057.09 ml  Output   1745 ml  Net 1312.09 ml    Physical Exam: General appearance: alert, cooperative and no distress Lungs: clear to auscultation bilaterally Heart: regular rate and rhythm and 2/6 systolic murmur Extremities: no edma   Rate: 82  Rhythm: normal sinus rhythm with LBBB and first degree AVB  Lab Results:  Recent Labs  06/01/13 0820 06/01/13 2345  WBC 10.1 12.2*  HGB 11.5* 12.4*  PLT 212 219    Recent Labs  06/02/13 1500 06/03/13 0430  NA 134* 134*  K 4.7 4.5  CL 96 97  CO2 26 26  GLUCOSE 342* 218*  BUN 26* 25*  CREATININE 1.81* 1.46*    Recent Labs  06/01/13 0820 06/01/13 1400  TROPONINI <0.30 <0.30    Recent Labs  06/03/13 0430  INR 1.07    Imaging: Imaging results have been reviewed  Cardiac Studies:  Assessment/Plan:   Principal Problem:   Atrial fibrillation with RVR- converted with Amiodarone Active Problems:   Acute on chronic diastolic heart failure - due to Afib AVR   Atrial flutter with rapid ventricular response   S/P CABG x 3 - 2010   S/P AVR-tissue, 2010   DM2 (diabetes mellitus, type 2)   Hypotension- secondary to AF and Diltiazem Rx   Acute renal failure: Cr up to 1.8   Hyperkalemia   Stroke- Lt brain 2003   Hemiplegia affecting right dominant side   GERD (gastroesophageal reflux disease)   Dyslipidemia   LBBB (left bundle branch block)   Obesity (BMI  30-39.9)    PLAN: It appears he has converted to NSR. Check EKG. SCr improving- 1.4 today, change to po Lasix.  Convert to po Amiodarone and continue loading. ? Start oral anticoagulation. ACE was stopped because of hypotension, renal insufficiency, and hyperkalemia.  Leave on Metoprolol low dose as opposed to Coreg secondary to soft B/P.   Corine Shelter PA-C Beeper [phone] 06/03/2013, 8:11 AM

## 2013-06-04 DIAGNOSIS — I5033 Acute on chronic diastolic (congestive) heart failure: Secondary | ICD-10-CM

## 2013-06-04 DIAGNOSIS — I5043 Acute on chronic combined systolic (congestive) and diastolic (congestive) heart failure: Secondary | ICD-10-CM

## 2013-06-04 LAB — HEPARIN LEVEL (UNFRACTIONATED): Heparin Unfractionated: <0.1 IU/mL — ABNORMAL LOW (ref 0.30–0.70)

## 2013-06-04 LAB — GLUCOSE, CAPILLARY
Glucose-Capillary: 240 mg/dL — ABNORMAL HIGH (ref 70–99)
Glucose-Capillary: 215 mg/dL — ABNORMAL HIGH (ref 70–99)
Glucose-Capillary: 240 mg/dL — ABNORMAL HIGH (ref 70–99)

## 2013-06-04 LAB — BASIC METABOLIC PANEL
BUN: 26 mg/dL — ABNORMAL HIGH (ref 6–23)
CO2: 28 mEq/L (ref 19–32)
Calcium: 8.9 mg/dL (ref 8.4–10.5)
GFR calc non Af Amer: 55 mL/min — ABNORMAL LOW (ref >90)
Glucose, Bld: 240 mg/dL — ABNORMAL HIGH (ref 70–99)
Sodium: 136 mEq/L (ref 135–145)

## 2013-06-04 MED ORDER — SODIUM CHLORIDE 0.9 % IJ SOLN: 3.0000 mL | INTRAMUSCULAR | Status: DC | PRN

## 2013-06-04 MED ORDER — HEPARIN (PORCINE) IN NACL 100-0.45 UNIT/ML-% IJ SOLN
1900.0000 [IU]/h | INTRAMUSCULAR | Status: DC
  Administered 2013-06-04 (×2): 2050 [IU]/h via INTRAVENOUS
  Filled 2013-06-04 (×4): qty 250

## 2013-06-04 MED ORDER — SODIUM CHLORIDE 0.9 % IV SOLN
1.0000 mL/kg/h | INTRAVENOUS | Status: DC
  Administered 2013-06-05: 1 mL/kg/h via INTRAVENOUS

## 2013-06-04 MED ORDER — FUROSEMIDE 10 MG/ML IJ SOLN
40.0000 mg | Freq: Two times a day (BID) | INTRAMUSCULAR | Status: DC
  Administered 2013-06-04 (×2): 40 mg via INTRAVENOUS
  Filled 2013-06-04 (×4): qty 4

## 2013-06-04 MED ORDER — INSULIN GLARGINE 100 UNIT/ML ~~LOC~~ SOLN
55.0000 [IU] | Freq: Two times a day (BID) | SUBCUTANEOUS | Status: DC
  Administered 2013-06-04 – 2013-06-05 (×2): 55 [IU] via SUBCUTANEOUS
  Filled 2013-06-04 (×3): qty 0.55

## 2013-06-04 NOTE — Progress Notes (Signed)
Spoke with cardiologist Herbie Baltimore about changing pt's IV sites for [facility]. MD stated to leave both IVs in and if they needed to be moved they could be changed in the morning. Sanda Linger, RN

## 2013-06-04 NOTE — Progress Notes (Signed)
  Subjective: Can breath laying down.  No CP.  The patient states he wants to go home today.    Objective: Vital signs in last 24 hours: Temp:  [97.9 F (36.6 C)-100.3 F (37.9 C)] 98.3 F (36.8 C) (10/21 0800) Pulse Rate:  [85-86] 86 (10/20 2126) Resp:  [22-30] 22 (10/21 0800) BP: (110-135)/(59-94) 111/94 mmHg (10/21 0800) SpO2:  [92 %-100 %] 93 % (10/21 0800) Weight:  [253 lb 8.5 oz (115 kg)] 253 lb 8.5 oz (115 kg) (10/21 0500) Last BM Date: 06/02/13  Intake/Output from previous day: 10/20 0701 - 10/21 0700 In: 1679.6 [P.O.:1520; I.V.:159.6] Out: 2400 [Urine:2400] Intake/Output this shift:    Medications Current Facility-Administered Medications  Medication Dose Route Frequency Provider Last Rate Last Dose  . 0.9 %  sodium chloride infusion  250 mL Intravenous PRN Rahul Sharma, MD 5 mL/hr at 06/02/13 1800 250 mL at 06/02/13 1800  . acetaminophen (TYLENOL) tablet 650 mg  650 mg Oral Q6H PRN Rahul Sharma, MD   650 mg at 06/03/13 2005  . ALPRAZolam (XANAX) tablet 1 mg  1 mg Oral QID PRN Rahul Sharma, MD      . [START ON 06/15/2013] amiodarone (PACERONE) tablet 200 mg  200 mg Oral Daily Luke K Kilroy, PA-C      . amiodarone (PACERONE) tablet 400 mg  400 mg Oral BID Luke K Kilroy, PA-C   400 mg at 06/04/13 0908  . [START ON 06/08/2013] amiodarone (PACERONE) tablet 400 mg  400 mg Oral Daily Luke K Kilroy, PA-C      . aspirin chewable tablet 81 mg  81 mg Oral Daily Kenneth C. Hilty, MD   81 mg at 06/04/13 0908  . atorvastatin (LIPITOR) tablet 20 mg  20 mg Oral q1800 Kenneth C. Hilty, MD   20 mg at 06/03/13 1718  . docusate sodium (COLACE) capsule 100 mg  100 mg Oral BID PRN Rahul Sharma, MD      . furosemide (LASIX) injection 40 mg  40 mg Intravenous BID Bryan Hager, PA-C   40 mg at 06/04/13 0911  . gabapentin (NEURONTIN) capsule 300 mg  300 mg Oral TID Rahul Sharma, MD   300 mg at 06/04/13 0908  . heparin ADULT infusion 100 units/mL (25000 units/250 mL)  2,050 Units/hr Intravenous  Continuous Frank Rhea Wilson, RPH 20.5 mL/hr at 06/04/13 0909 2,050 Units/hr at 06/04/13 0909  . insulin aspart (novoLOG) injection 0-20 Units  0-20 Units Subcutaneous TID WC Loucinda Croy W Dayanara Sherrill, MD   7 Units at 06/04/13 0811  . insulin aspart (novoLOG) injection 30 Units  30 Units Subcutaneous TID WC Zannah Melucci W Doyne Ellinger, MD   30 Units at 06/04/13 0815  . insulin glargine (LANTUS) injection 55 Units  55 Units Subcutaneous BID Bryan Hager, PA-C      . levothyroxine (SYNTHROID, LEVOTHROID) tablet 25 mcg  25 mcg Oral QAC breakfast Rahul Sharma, MD   25 mcg at 06/04/13 0643  . metoprolol tartrate (LOPRESSOR) tablet 12.5 mg  12.5 mg Oral BID Luke K Kilroy, PA-C   12.5 mg at 06/04/13 0908  . ondansetron (ZOFRAN) injection 4 mg  4 mg Intravenous Q6H PRN Rahul Sharma, MD   4 mg at 06/01/13 2315  . sodium chloride 0.9 % injection 3 mL  3 mL Intravenous Q12H Rahul Sharma, MD   3 mL at 06/04/13 0813  . sodium chloride 0.9 % injection 3 mL  3 mL Intravenous PRN Rahul Sharma, MD        PE: General   appearance: alert, cooperative and no distress Lungs: Decreased BS but clear Heart: regular rate and rhythm and 1/6 sys MM Extremities: 2+ LEE Pulses: 2+ and symmetric Skin: Warm and dry Neurologic: Grossly normal  Lab Results:   Recent Labs  06/01/13 2345  WBC 12.2*  HGB 12.4*  HCT 36.1*  PLT 219   BMET  Recent Labs  06/02/13 1500 06/03/13 0430 06/04/13 0435  NA 134* 134* 136  K 4.7 4.5 4.6  CL 96 97 98  CO2 26 26 28  GLUCOSE 342* 218* 240*  BUN 26* 25* 26*  CREATININE 1.81* 1.46* 1.32  CALCIUM 9.1 9.0 8.9   PT/INR  Recent Labs  06/02/13 0540 06/03/13 0430  LABPROT 13.9 13.7  INR 1.09 1.07    Assessment/Plan   Principal Problem:   Atrial fibrillation with RVR- converted with Amiodarone Active Problems:   Acute on chronic diastolic heart failure - due to Afib AVR   Atrial flutter with rapid ventricular response   Acute on chronic combined systolic and diastolic HF (heart failure),  NYHA class 3   Acute renal failure: Cr up to 1.8   Hyperkalemia   Stroke- Lt brain 2003   Hemiplegia affecting right dominant side   GERD (gastroesophageal reflux disease)   S/P CABG x 3 - 2010   S/P AVR-tissue, 2010   DM2 (diabetes mellitus, type 2)   Dyslipidemia   LBBB (left bundle branch block)   Hypotension- secondary to AF and Diltiazem Rx   Obesity (BMI 30-39.9)  Plan:  Loaded on Amio.  Now on PO.  NSR.  First degree AVB, LBBB.   Echo:  EF 25-30% Grade 3 diastolic dysfunction.  Previous echo 10/04/11 showed normal EF. Recommend left heart cath.  Hold eliquis.  Start heparin.  The patient is volume overloaded and symptomatic.  Will increase lasix to 40mg IV BID.  Will need to consider a Lifevest prior to DC.    SCr has improve from 1.81 to 1.32.     LOS: 4 days   HAGER, BRYAN, PA-C 06/04/2013 9:17 AM  I have seen and evaluated the patient this AM along with Bryan Hager, PA. I agree with his findings, examination as well as impression recommendations.   Echo results are concerning - significantly reduced LVEF with Grade 3 Diastolic Fxn. NO specific regional WMA.  With significant drop in LVEF, would need an ischemic evaluation --> in the absence of any anginal Sx or regional WMA, would consider non-invasive Myoview as opposed to LHC, especially in light of recent Acute Renal Failure (finally now normalizing).    Will d/w primary cardiologist to determine plan.  For now, will stop Eliquis until +/- CATH decision made, IV Heparin until then.  Still orthopneic & dyspneic -- needs diuresis, would like to add back afterload reduction (ACE-I on hold due to hypotension & ARF), ? Switch BB to Carvedilol prior to d/c.  Add back ACE-I once renal Fxn stable.  IV Lasix BID  Glycemic control improved.  --> per DM service, will increase Lantus to 45 Units bid. Continue current meal coverage.  He is reluctant to stay, but I spent ~20 minutes counseling him on the importance of staying to  complete Rx & Dx course.  Transfer to tele.  Deondrae Mcgrail W, M.D., M.S. [facility] [address] [place], [place]  [address]  [phone] Pager # [phone] 06/04/2013 9:17 AM      

## 2013-06-04 NOTE — Progress Notes (Signed)
Gave report to sarah rn on 3w

## 2013-06-04 NOTE — Progress Notes (Signed)
Inpatient Diabetes Program Recommendations  AACE/ADA: New Consensus Statement on Inpatient Glycemic Control (2013)  Target Ranges:  Prepandial:   less than 140 mg/dL      Peak postprandial:   less than 180 mg/dL (1-2 hours)      Critically ill patients:  140 - 180 mg/dL   Reason for Visit: Hyperglycemia  Results for IZZY, COURVILLE (MRN [id number]) as of 06/04/2013 14:30  Ref. Range 06/03/2013 21:34 06/04/2013 08:06 06/04/2013 11:35  Glucose-Capillary Latest Range: 70-99 mg/dL 409 (H) 811 (H) 914 (H)   Continues with blood sugars >200 mg/dL.  Consider increasing meal coverage insulin to Novolog 35 units tidwc. Titrate until post-prandial blood sugars < 180 mg/dL.  Will continue to follow while inpatient. Thank you. Ailene Ards, RD, LDN, CDE Inpatient Diabetes Coordinator [phone]

## 2013-06-04 NOTE — Progress Notes (Signed)
ANTICOAGULATION CONSULT NOTE - Follow Up Consult  Pharmacy Consult for heparin>>apixaban Indication: atrial fibrillation  No Known Allergies  Patient Measurements: Height: 5' 10.5" (179.1 cm) Weight: 253 lb 8.5 oz (115 kg) IBW/kg (Calculated) : 74.15 Heparin Dosing Weight: 100 kg  Vital Signs: Temp: 98.3 F (36.8 C) (10/21 0800) Temp src: Oral (10/21 0400) BP: 111/94 mmHg (10/21 0800) Pulse Rate: 86 (10/20 2126)  Labs:  Recent Labs  06/01/13 1400  06/01/13 2345  06/02/13 0540 06/02/13 1500 06/03/13 0430 06/04/13 0435  HGB  --   --  12.4*  --   --   --   --   --   HCT  --   --  36.1*  --   --   --   --   --   PLT  --   --  219  --   --   --   --   --   LABPROT  --   --   --   --  13.9  --  13.7  --   INR  --   --   --   --  1.09  --  1.07  --   HEPARINUNFRC  --   < > 0.42  --  0.35  --  0.34  --   CREATININE  --   --   --   < > 1.80* 1.81* 1.46* 1.32  TROPONINI <0.30  --   --   --   --   --   --   --   < > = values in this interval not displayed.  Estimated Creatinine Clearance: 71.4 ml/min (by C-G formula based on Cr of 1.32).  Assessment:  67 yo M admitted 05/31/2013  With Afib.  Anticoagulation: new onset afib. Patient transitioned to po apixaban yesterday and received two doses. Echo performed shows new LV dysfunction with ef 25% and heart cath is recommended. Will start IV heparin this am and stop apixaban. Patient was therapeutic on IV heparin prior to apixaban so will restart at previous rate.  Goal of Therapy:  Heparin level 0.3-0.7 units/ml Monitor platelets by anticoagulation protocol: Yes   Plan:  1) restart heparin at 2050 units/hr - check HL/aptt in 6 hours 2) Apixaban education complete   Thank you for allowing pharmacy to be a part of this patients care team.  Sheppard Coil PharmD., BCPS Clinical Pharmacist Pager [phone] 06/04/2013 8:49 AM

## 2013-06-04 NOTE — Progress Notes (Signed)
ANTICOAGULATION CONSULT NOTE - Follow Up Consult  Pharmacy Consult for Heparin Indication: atrial fibrillation  No Known Allergies  Patient Measurements: Height: 5' 10.5" (179.1 cm) Weight: 253 lb 8.5 oz (115 kg) IBW/kg (Calculated) : 74.15 Heparin Dosing Weight: 100 kg  Vital Signs: Temp: 98.3 F (36.8 C) (10/21 1503) Temp src: Oral (10/21 1503) BP: 119/73 mmHg (10/21 1503) Pulse Rate: 80 (10/21 1503)  Labs:  Recent Labs  06/01/13 2345  06/02/13 0540 06/02/13 1500 06/03/13 0430 06/04/13 0435 06/04/13 1915  HGB 12.4*  --   --   --   --   --   --   HCT 36.1*  --   --   --   --   --   --   PLT 219  --   --   --   --   --   --   APTT  --   --   --   --   --   --  78*  LABPROT  --   --  13.9  --  13.7  --   --   INR  --   --  1.09  --  1.07  --   --   HEPARINUNFRC 0.42  --  0.35  --  0.34  --  <0.10*  CREATININE  --   < > 1.80* 1.81* 1.46* 1.32  --   < > = values in this interval not displayed.  Estimated Creatinine Clearance: 71.4 ml/min (by C-G formula based on Cr of 1.32).  Assessment:  67 yo M admitted 05/31/2013  With Afib.  Anticoagulation: new onset afib. Patient transitioned to po apixaban 10/20 and received two doses. Echo performed shows new LV dysfunction with ef 25% and heart cath is recommended. Heparin level <0.1 but aPTT 78 in goal range while Apixaban still being eliminated from system. Heparin levels unreliable.  Goal of Therapy:  Heparin level 0.3-0.7 units/ml APTT 66-102  Monitor platelets by anticoagulation protocol: Yes   Plan:  1) Continue Heparin at 2050 units/hr  2) Check aPTT again in am    Tal Neer S. Merilynn Finland, PharmD, [facility] Clinical Staff Pharmacist Pager [phone]  06/04/2013 8:12 PM

## 2013-06-05 ENCOUNTER — Encounter (HOSPITAL_COMMUNITY)
Admission: EM | Disposition: A | Discharge status: Home / Self Care | Payer: Self-pay | Source: Home / Self Care | Attending: Internal Medicine

## 2013-06-05 DIAGNOSIS — I2510 Atherosclerotic heart disease of native coronary artery without angina pectoris: Secondary | ICD-10-CM

## 2013-06-05 HISTORY — PX: LEFT AND RIGHT HEART CATHETERIZATION WITH CORONARY ANGIOGRAM: SHX5449

## 2013-06-05 HISTORY — PX: GRAFT(S) ANGIOGRAM: SHX5479

## 2013-06-05 HISTORY — PX: CARDIAC CATHETERIZATION: SHX172

## 2013-06-05 LAB — GLUCOSE, CAPILLARY: Glucose-Capillary: 209 mg/dL — ABNORMAL HIGH (ref 70–99)

## 2013-06-05 LAB — CBC
HCT: 35.6 % — ABNORMAL LOW (ref 39.0–52.0)
Hemoglobin: 11.7 g/dL — ABNORMAL LOW (ref 13.0–17.0)
MCHC: 32.9 g/dL (ref 30.0–36.0)
MCV: 83 fL (ref 78.0–100.0)
RBC: 4.29 MIL/uL (ref 4.22–5.81)
RDW: 13.8 % (ref 11.5–15.5)

## 2013-06-05 LAB — BASIC METABOLIC PANEL
BUN: 27 mg/dL — ABNORMAL HIGH (ref 6–23)
CO2: 29 mEq/L (ref 19–32)
Calcium: 9.6 mg/dL (ref 8.4–10.5)
GFR calc non Af Amer: 46 mL/min — ABNORMAL LOW (ref >90)
Glucose, Bld: 238 mg/dL — ABNORMAL HIGH (ref 70–99)

## 2013-06-05 LAB — HEPARIN LEVEL (UNFRACTIONATED): Heparin Unfractionated: 0.89 IU/mL — ABNORMAL HIGH (ref 0.30–0.70)

## 2013-06-05 LAB — MAGNESIUM: Magnesium: 2.1 mg/dL (ref 1.5–2.5)

## 2013-06-05 SURGERY — LEFT AND RIGHT HEART CATHETERIZATION WITH CORONARY ANGIOGRAM
Anesthesia: LOCAL

## 2013-06-05 MED ORDER — SODIUM CHLORIDE 0.9 % IV SOLN: 1.0000 mL/kg/h | INTRAVENOUS | Status: AC

## 2013-06-05 MED ORDER — SODIUM CHLORIDE 0.9 % IJ SOLN: 3.0000 mL | Freq: Two times a day (BID) | INTRAMUSCULAR | Status: DC

## 2013-06-05 MED ORDER — SODIUM CHLORIDE 0.9 % IV SOLN: 250.0000 mL | INTRAVENOUS | Status: DC | PRN

## 2013-06-05 MED ORDER — SODIUM CHLORIDE 0.9 % IJ SOLN
3.0000 mL | Freq: Two times a day (BID) | INTRAMUSCULAR | Status: DC
  Administered 2013-06-06 (×2): 3 mL via INTRAVENOUS

## 2013-06-05 MED ORDER — MIDAZOLAM HCL 2 MG/2ML IJ SOLN
INTRAMUSCULAR | Status: AC
  Filled 2013-06-05: qty 2

## 2013-06-05 MED ORDER — APIXABAN 5 MG PO TABS
5.0000 mg | ORAL_TABLET | Freq: Two times a day (BID) | ORAL | Status: DC
  Administered 2013-06-05 – 2013-06-07 (×4): 5 mg via ORAL
  Filled 2013-06-05 (×5): qty 1

## 2013-06-05 MED ORDER — FUROSEMIDE 10 MG/ML IJ SOLN
40.0000 mg | Freq: Three times a day (TID) | INTRAMUSCULAR | Status: DC
  Administered 2013-06-05 – 2013-06-06 (×2): 40 mg via INTRAVENOUS
  Filled 2013-06-05 (×3): qty 4

## 2013-06-05 MED ORDER — LIDOCAINE HCL (PF) 1 % IJ SOLN
INTRAMUSCULAR | Status: AC
  Filled 2013-06-05: qty 30

## 2013-06-05 MED ORDER — NITROGLYCERIN 0.2 MG/ML ON CALL CATH LAB
INTRAVENOUS | Status: AC
  Filled 2013-06-05: qty 1

## 2013-06-05 MED ORDER — ASPIRIN 81 MG PO CHEW
81.0000 mg | CHEWABLE_TABLET | ORAL | Status: AC
  Administered 2013-06-05: 81 mg via ORAL
  Filled 2013-06-05: qty 1

## 2013-06-05 MED ORDER — SODIUM CHLORIDE 0.9 % IJ SOLN: 3.0000 mL | INTRAMUSCULAR | Status: DC | PRN

## 2013-06-05 MED ORDER — INSULIN ASPART 100 UNIT/ML ~~LOC~~ SOLN
35.0000 [IU] | Freq: Three times a day (TID) | SUBCUTANEOUS | Status: DC
  Administered 2013-06-05 – 2013-06-07 (×6): 35 [IU] via SUBCUTANEOUS

## 2013-06-05 MED ORDER — HEPARIN (PORCINE) IN NACL 2-0.9 UNIT/ML-% IJ SOLN
INTRAMUSCULAR | Status: AC
  Filled 2013-06-05: qty 1000

## 2013-06-05 MED ORDER — INSULIN GLARGINE 100 UNIT/ML ~~LOC~~ SOLN
60.0000 [IU] | Freq: Two times a day (BID) | SUBCUTANEOUS | Status: DC
  Administered 2013-06-05 – 2013-06-07 (×4): 60 [IU] via SUBCUTANEOUS
  Filled 2013-06-05 (×5): qty 0.6

## 2013-06-05 MED ORDER — PANTOPRAZOLE SODIUM 40 MG PO TBEC
40.0000 mg | DELAYED_RELEASE_TABLET | Freq: Two times a day (BID) | ORAL | Status: DC
  Administered 2013-06-05 – 2013-06-07 (×4): 40 mg via ORAL
  Filled 2013-06-05 (×4): qty 1

## 2013-06-05 MED ORDER — HEPARIN SODIUM (PORCINE) 1000 UNIT/ML IJ SOLN
INTRAMUSCULAR | Status: AC
  Filled 2013-06-05: qty 1

## 2013-06-05 NOTE — Interval H&P Note (Signed)
History and Physical Interval Note:  06/05/2013 10:52 AM  [name]  has presented today for surgery, with the diagnosis of New Diagnosis of Cardiomyopathy - EF ~30% with Acute on Chronic Combined Heart Failure. The various methods of treatment have been discussed with the patient and family. After consideration of risks, benefits and other options for treatment, the patient has consented to  Procedure(s): LEFT AND RIGHT HEART CATHETERIZATION WITH CORONARY ANGIOGRAM (N/A) GRAFT(S) ANGIOGRAM & Possible PCI as a surgical intervention .  The patient's history has been reviewed, patient examined, no change in status, stable for surgery.  I have reviewed the patient's chart and labs.  Questions were answered to the patient's satisfaction.     [name] W  Cath Lab Visit (complete for each Cath Lab visit)  Clinical Evaluation Leading to the Procedure:   ACS: no  Non-ACS:    Anginal Classification: CCS III - dyspnea  Anti-ischemic medical therapy: Maximal Therapy (2 or more classes of medications)  Non-Invasive Test Results: No non-invasive testing performed  Prior CABG: Previous CABG

## 2013-06-05 NOTE — Progress Notes (Signed)
Inpatient Diabetes Program Recommendations  AACE/ADA: New Consensus Statement on Inpatient Glycemic Control (2013)  Target Ranges:  Prepandial:   less than 140 mg/dL      Peak postprandial:   less than 180 mg/dL (1-2 hours)      Critically ill patients:  140 - 180 mg/dL   Reason for Visit: Hyperglycemia  Results for KANTON, KAMEL (MRN [id number]) as of 06/05/2013 12:52  Ref. Range 06/04/2013 08:06 06/04/2013 11:35 06/04/2013 16:52 06/04/2013 20:44 06/05/2013 07:33  Glucose-Capillary Latest Range: 70-99 mg/dL 846 (H) 962 (H) 952 (H) 254 (H) 233 (H)   Blood sugars continue >180mg /dL.  Consider increasing Lantus to 60 units bid. Increase Novolog to 35 units tidwc.  Will continue to follow. Thank you. Ailene Ards, RD, LDN, CDE Inpatient Diabetes Coordinator [phone]

## 2013-06-05 NOTE — Progress Notes (Addendum)
DAILY PROGRESS NOTE  Subjective:  Short of breath, wants to go home. Plan for [facility] today.  Creatinine is elevated somewhat today. Blood glucose remains elevated in the 230 range.  On heparin for cath.  Objective:  Temp:  [98 F (36.7 C)-98.3 F (36.8 C)] 98.1 F (36.7 C) (10/22 0617) Pulse Rate:  [71-88] 71 (10/22 0617) Resp:  [18] 18 (10/22 0617) BP: (119-142)/(73-76) 142/76 mmHg (10/22 0617) SpO2:  [94 %-96 %] 94 % (10/22 0617) Weight change:   Intake/Output from previous day: 10/21 0701 - 10/22 0700 In: 1384.5 [P.O.:1200; I.V.:184.5] Out: 3450 [Urine:3450]  Intake/Output from this shift: Total I/O In: 0  Out: 300 [Urine:300]  Medications: Current Facility-Administered Medications  Medication Dose Route Frequency Provider Last Rate Last Dose  . 0.9 %  sodium chloride infusion  250 mL Intravenous PRN Christie Nottingham, MD 5 mL/hr at 06/02/13 1800 250 mL at 06/02/13 1800  . 0.9 %  sodium chloride infusion  250 mL Intravenous PRN Chrystie Nose, MD      . 0.9 %  sodium chloride infusion  1 mL/kg/hr Intravenous Continuous Chrystie Nose, MD 115 mL/hr at 06/05/13 1253 1 mL/kg/hr at 06/05/13 1253  . acetaminophen (TYLENOL) tablet 650 mg  650 mg Oral Q6H PRN Christie Nottingham, MD   650 mg at 06/03/13 2005  . ALPRAZolam Prudy Feeler) tablet 1 mg  1 mg Oral QID PRN Christie Nottingham, MD      . Melene Muller ON 06/15/2013] amiodarone (PACERONE) tablet 200 mg  200 mg Oral Daily Luke K Kilroy, PA-C      . amiodarone (PACERONE) tablet 400 mg  400 mg Oral BID Eda Paschal Kilroy, PA-C   400 mg at 06/05/13 4782  . [START ON 06/08/2013] amiodarone (PACERONE) tablet 400 mg  400 mg Oral Daily Luke K Kilroy, PA-C      . atorvastatin (LIPITOR) tablet 20 mg  20 mg Oral q1800 Chrystie Nose, MD   20 mg at 06/04/13 1728  . docusate sodium (COLACE) capsule 100 mg  100 mg Oral BID PRN Christie Nottingham, MD      . gabapentin (NEURONTIN) capsule 300 mg  300 mg Oral TID Christie Nottingham, MD   300 mg at 06/05/13 0927  . heparin  ADULT infusion 100 units/mL (25000 units/250 mL)  1,900 Units/hr Intravenous Continuous Judie Bonus Hammons, RPH 19 mL/hr at 06/05/13 0853 1,900 Units/hr at 06/05/13 0853  . insulin aspart (novoLOG) injection 0-20 Units  0-20 Units Subcutaneous TID WC Marykay Lex, MD   4 Units at 06/04/13 1729  . insulin aspart (novoLOG) injection 30 Units  30 Units Subcutaneous TID WC Marykay Lex, MD   30 Units at 06/04/13 1730  . insulin glargine (LANTUS) injection 55 Units  55 Units Subcutaneous BID Wilburt Finlay, PA-C   55 Units at 06/04/13 2125  . levothyroxine (SYNTHROID, LEVOTHROID) tablet 25 mcg  25 mcg Oral QAC breakfast Christie Nottingham, MD   25 mcg at 06/05/13 0738  . metoprolol tartrate (LOPRESSOR) tablet 12.5 mg  12.5 mg Oral BID Eda Paschal Kilroy, PA-C   12.5 mg at 06/05/13 9562  . ondansetron (ZOFRAN) injection 4 mg  4 mg Intravenous Q6H PRN Christie Nottingham, MD   4 mg at 06/01/13 2315  . sodium chloride 0.9 % injection 3 mL  3 mL Intravenous Q12H Christie Nottingham, MD   3 mL at 06/04/13 2125  . sodium chloride 0.9 % injection 3 mL  3 mL Intravenous PRN Christie Nottingham, MD      .  sodium chloride 0.9 % injection 3 mL  3 mL Intravenous Q12H Chrystie Nose, MD      . sodium chloride 0.9 % injection 3 mL  3 mL Intravenous PRN Chrystie Nose, MD        Physical Exam: General appearance: alert and no distress Neck: JVD - 5 cm above sternal notch, no carotid bruit and thyroid not enlarged, symmetric, no tenderness/mass/nodules Lungs: diminished breath sounds bilaterally Heart: regular rate and rhythm, S1, S2 normal and systolic murmur: early systolic 2/6, crescendo at 2nd left intercostal space Abdomen: soft, non-tender; bowel sounds normal; no masses,  no organomegaly Extremities: extremities normal, atraumatic, no cyanosis or edema and edema 1+ Pulses: 1+ pulses Skin: Skin color, texture, turgor normal. No rashes or lesions Neurologic: Grossly normal Psych: Pleasant, somewhat slow of thought  Lab  Results: Results for orders placed during the hospital encounter of 05/31/13 (from the past 48 hour(s))  GLUCOSE, CAPILLARY     Status: Abnormal   Collection Time    06/03/13  4:32 PM      Result Value Range   Glucose-Capillary 141 (*) 70 - 99 mg/dL  GLUCOSE, CAPILLARY     Status: Abnormal   Collection Time    06/03/13  9:34 PM      Result Value Range   Glucose-Capillary 240 (*) 70 - 99 mg/dL  BASIC METABOLIC PANEL     Status: Abnormal   Collection Time    06/04/13  4:35 AM      Result Value Range   Sodium 136  135 - 145 mEq/L   Potassium 4.6  3.5 - 5.1 mEq/L   Chloride 98  96 - 112 mEq/L   CO2 28  19 - 32 mEq/L   Glucose, Bld 240 (*) 70 - 99 mg/dL   BUN 26 (*) 6 - 23 mg/dL   Creatinine, Ser 1.30  0.50 - 1.35 mg/dL   Calcium 8.9  8.4 - 86.5 mg/dL   GFR calc non Af Amer 55 (*) >90 mL/min   GFR calc Af Amer 64 (*) >90 mL/min   Comment: (NOTE)     The eGFR has been calculated using the CKD EPI equation.     This calculation has not been validated in all clinical situations.     eGFR's persistently <90 mL/min signify possible Chronic Kidney     Disease.  MAGNESIUM     Status: None   Collection Time    06/04/13  4:35 AM      Result Value Range   Magnesium 2.0  1.5 - 2.5 mg/dL  GLUCOSE, CAPILLARY     Status: Abnormal   Collection Time    06/04/13  8:06 AM      Result Value Range   Glucose-Capillary 215 (*) 70 - 99 mg/dL  GLUCOSE, CAPILLARY     Status: Abnormal   Collection Time    06/04/13 11:35 AM      Result Value Range   Glucose-Capillary 240 (*) 70 - 99 mg/dL  GLUCOSE, CAPILLARY     Status: Abnormal   Collection Time    06/04/13  4:52 PM      Result Value Range   Glucose-Capillary 195 (*) 70 - 99 mg/dL   Comment 1 Notify RN    HEPARIN LEVEL (UNFRACTIONATED)     Status: Abnormal   Collection Time    06/04/13  7:15 PM      Result Value Range   Heparin Unfractionated <0.10 (*) 0.30 - 0.70 IU/mL  Comment:            IF HEPARIN RESULTS ARE BELOW     EXPECTED  VALUES, AND PATIENT     DOSAGE HAS BEEN CONFIRMED,     SUGGEST FOLLOW UP TESTING     OF ANTITHROMBIN III LEVELS.  APTT     Status: Abnormal   Collection Time    06/04/13  7:15 PM      Result Value Range   aPTT 78 (*) 24 - 37 seconds   Comment:            IF BASELINE aPTT IS ELEVATED,     SUGGEST PATIENT RISK ASSESSMENT     BE USED TO DETERMINE APPROPRIATE     ANTICOAGULANT THERAPY.  GLUCOSE, CAPILLARY     Status: Abnormal   Collection Time    06/04/13  8:44 PM      Result Value Range   Glucose-Capillary 254 (*) 70 - 99 mg/dL  BASIC METABOLIC PANEL     Status: Abnormal   Collection Time    06/05/13  5:17 AM      Result Value Range   Sodium 135  135 - 145 mEq/L   Potassium 4.4  3.5 - 5.1 mEq/L   Chloride 94 (*) 96 - 112 mEq/L   CO2 29  19 - 32 mEq/L   Glucose, Bld 238 (*) 70 - 99 mg/dL   BUN 27 (*) 6 - 23 mg/dL   Creatinine, Ser 6.96 (*) 0.50 - 1.35 mg/dL   Calcium 9.6  8.4 - 29.5 mg/dL   GFR calc non Af Amer 46 (*) >90 mL/min   GFR calc Af Amer 54 (*) >90 mL/min   Comment: (NOTE)     The eGFR has been calculated using the CKD EPI equation.     This calculation has not been validated in all clinical situations.     eGFR's persistently <90 mL/min signify possible Chronic Kidney     Disease.  MAGNESIUM     Status: None   Collection Time    06/05/13  5:17 AM      Result Value Range   Magnesium 2.1  1.5 - 2.5 mg/dL  HEPARIN LEVEL (UNFRACTIONATED)     Status: Abnormal   Collection Time    06/05/13  5:17 AM      Result Value Range   Heparin Unfractionated 0.89 (*) 0.30 - 0.70 IU/mL   Comment:            IF HEPARIN RESULTS ARE BELOW     EXPECTED VALUES, AND PATIENT     DOSAGE HAS BEEN CONFIRMED,     SUGGEST FOLLOW UP TESTING     OF ANTITHROMBIN III LEVELS.  CBC     Status: Abnormal   Collection Time    06/05/13  5:17 AM      Result Value Range   WBC 7.8  4.0 - 10.5 K/uL   RBC 4.29  4.22 - 5.81 MIL/uL   Hemoglobin 11.7 (*) 13.0 - 17.0 g/dL   HCT 28.4 (*) 13.2 - 44.0  %   MCV 83.0  78.0 - 100.0 fL   MCH 27.3  26.0 - 34.0 pg   MCHC 32.9  30.0 - 36.0 g/dL   RDW 10.2  72.5 - 36.6 %   Platelets 217  150 - 400 K/uL  APTT     Status: Abnormal   Collection Time    06/05/13  5:17 AM      Result Value Range  aPTT 106 (*) 24 - 37 seconds   Comment:            IF BASELINE aPTT IS ELEVATED,     SUGGEST PATIENT RISK ASSESSMENT     BE USED TO DETERMINE APPROPRIATE     ANTICOAGULANT THERAPY.  GLUCOSE, CAPILLARY     Status: Abnormal   Collection Time    06/05/13  7:33 AM      Result Value Range   Glucose-Capillary 233 (*) 70 - 99 mg/dL  GLUCOSE, CAPILLARY     Status: Abnormal   Collection Time    06/05/13 12:46 PM      Result Value Range   Glucose-Capillary 232 (*) 70 - 99 mg/dL    Imaging: No results found.  Assessment:  1. Principal Problem: 2.   Atrial fibrillation with RVR- converted with Amiodarone 3. Active Problems: 4.   Stroke- Lt brain 2003 5.   Hemiplegia affecting right dominant side 6.   GERD (gastroesophageal reflux disease) 7.   S/P CABG x 3 - 2010 8.   S/P AVR-tissue, 2010 9.   DM2 (diabetes mellitus, type 2) 10.   Dyslipidemia 11.   LBBB (left bundle branch block) 12.   Hypotension- secondary to AF and Diltiazem Rx 13.   Obesity (BMI 30-39.9) 14.   Acute on chronic diastolic heart failure - due to Afib AVR 15.   Atrial flutter with rapid ventricular response 16.   Acute renal failure: Cr up to 1.8 17.   Hyperkalemia 18.   Acute on chronic combined systolic and diastolic HF (heart failure), NYHA class 3 19.   Plan:  1. Plan for [facility] today. Suspect he will need more diuresis +/- intervention. BG remains elevated, will need to increase lantus dosing to 60 units BID. On lipitor 20 mg for dyslipidemia. Continue po amiodarone for rate/rhythm control. He has a history of GERD - restart home protonix BID.  Time Spent Directly with Patient:  15 minutes  Length of Stay:  LOS: 5 days   Chrystie Nose, MD, [facility] Attending  Cardiologist [facility]  Hari Casaus C 06/05/2013, 1:26 PM

## 2013-06-05 NOTE — H&P (View-Only) (Signed)
Subjective: Can breath laying down.  No CP.  The patient states he wants to go home today.    Objective: Vital signs in last 24 hours: Temp:  [97.9 F (36.6 C)-100.3 F (37.9 C)] 98.3 F (36.8 C) (10/21 0800) Pulse Rate:  [85-86] 86 (10/20 2126) Resp:  [22-30] 22 (10/21 0800) BP: (110-135)/(59-94) 111/94 mmHg (10/21 0800) SpO2:  [92 %-100 %] 93 % (10/21 0800) Weight:  [253 lb 8.5 oz (115 kg)] 253 lb 8.5 oz (115 kg) (10/21 0500) Last BM Date: 06/02/13  Intake/Output from previous day: 10/20 0701 - 10/21 0700 In: 1679.6 [P.O.:1520; I.V.:159.6] Out: 2400 [Urine:2400] Intake/Output this shift:    Medications Current Facility-Administered Medications  Medication Dose Route Frequency Provider Last Rate Last Dose  . 0.9 %  sodium chloride infusion  250 mL Intravenous PRN Christie Nottingham, MD 5 mL/hr at 06/02/13 1800 250 mL at 06/02/13 1800  . acetaminophen (TYLENOL) tablet 650 mg  650 mg Oral Q6H PRN Christie Nottingham, MD   650 mg at 06/03/13 2005  . ALPRAZolam Prudy Feeler) tablet 1 mg  1 mg Oral QID PRN Christie Nottingham, MD      . Melene Muller ON 06/15/2013] amiodarone (PACERONE) tablet 200 mg  200 mg Oral Daily Luke K Kilroy, PA-C      . amiodarone (PACERONE) tablet 400 mg  400 mg Oral BID Eda Paschal Kilroy, PA-C   400 mg at 06/04/13 0908  . [START ON 06/08/2013] amiodarone (PACERONE) tablet 400 mg  400 mg Oral Daily Luke K Kilroy, PA-C      . aspirin chewable tablet 81 mg  81 mg Oral Daily Chrystie Nose, MD   81 mg at 06/04/13 0908  . atorvastatin (LIPITOR) tablet 20 mg  20 mg Oral q1800 Chrystie Nose, MD   20 mg at 06/03/13 1718  . docusate sodium (COLACE) capsule 100 mg  100 mg Oral BID PRN Christie Nottingham, MD      . furosemide (LASIX) injection 40 mg  40 mg Intravenous BID Wilburt Finlay, PA-C   40 mg at 06/04/13 0911  . gabapentin (NEURONTIN) capsule 300 mg  300 mg Oral TID Christie Nottingham, MD   300 mg at 06/04/13 0908  . heparin ADULT infusion 100 units/mL (25000 units/250 mL)  2,050 Units/hr Intravenous  Continuous Severiano Gilbert, RPH 20.5 mL/hr at 06/04/13 0909 2,050 Units/hr at 06/04/13 0909  . insulin aspart (novoLOG) injection 0-20 Units  0-20 Units Subcutaneous TID WC Marykay Lex, MD   7 Units at 06/04/13 [phone]  . insulin aspart (novoLOG) injection 30 Units  30 Units Subcutaneous TID WC Marykay Lex, MD   30 Units at 06/04/13 0815  . insulin glargine (LANTUS) injection 55 Units  55 Units Subcutaneous BID Wilburt Finlay, PA-C      . levothyroxine (SYNTHROID, LEVOTHROID) tablet 25 mcg  25 mcg Oral QAC breakfast Christie Nottingham, MD   25 mcg at 06/04/13 [id number]  . metoprolol tartrate (LOPRESSOR) tablet 12.5 mg  12.5 mg Oral BID Eda Paschal Kilroy, PA-C   12.5 mg at 06/04/13 0908  . ondansetron (ZOFRAN) injection 4 mg  4 mg Intravenous Q6H PRN Christie Nottingham, MD   4 mg at 06/01/13 2315  . sodium chloride 0.9 % injection 3 mL  3 mL Intravenous Q12H Christie Nottingham, MD   3 mL at 06/04/13 0813  . sodium chloride 0.9 % injection 3 mL  3 mL Intravenous PRN Christie Nottingham, MD        PE: General  appearance: alert, cooperative and no distress Lungs: Decreased BS but clear Heart: regular rate and rhythm and 1/6 sys MM Extremities: 2+ LEE Pulses: 2+ and symmetric Skin: Warm and dry Neurologic: Grossly normal  Lab Results:   Recent Labs  06/01/13 2345  WBC 12.2*  HGB 12.4*  HCT 36.1*  PLT 219   BMET  Recent Labs  06/02/13 1500 06/03/13 0430 06/04/13 0435  NA 134* 134* 136  K 4.7 4.5 4.6  CL 96 97 98  CO2 26 26 28   GLUCOSE 342* 218* 240*  BUN 26* 25* 26*  CREATININE 1.81* 1.46* 1.32  CALCIUM 9.1 9.0 8.9   PT/INR  Recent Labs  06/02/13 0540 06/03/13 0430  LABPROT 13.9 13.7  INR 1.09 1.07    Assessment/Plan   Principal Problem:   Atrial fibrillation with RVR- converted with Amiodarone Active Problems:   Acute on chronic diastolic heart failure - due to Afib AVR   Atrial flutter with rapid ventricular response   Acute on chronic combined systolic and diastolic HF (heart failure),  NYHA class 3   Acute renal failure: Cr up to 1.8   Hyperkalemia   Stroke- Lt brain 2003   Hemiplegia affecting right dominant side   GERD (gastroesophageal reflux disease)   S/P CABG x 3 - 2010   S/P AVR-tissue, 2010   DM2 (diabetes mellitus, type 2)   Dyslipidemia   LBBB (left bundle branch block)   Hypotension- secondary to AF and Diltiazem Rx   Obesity (BMI 30-39.9)  Plan:  Loaded on Amio.  Now on PO.  NSR.  First degree AVB, LBBB.   Echo:  EF 25-30% Grade 3 diastolic dysfunction.  Previous echo 10/04/11 showed normal EF. Recommend left heart cath.  Hold eliquis.  Start heparin.  The patient is volume overloaded and symptomatic.  Will increase lasix to 40mg  IV BID.  Will need to consider a Lifevest prior to DC.    SCr has improve from 1.81 to 1.32.     LOS: 4 days   HAGER, BRYAN, PA-C 06/04/2013 9:17 AM  I have seen and evaluated the patient this AM along with Wilburt Finlay, PA. I agree with his findings, examination as well as impression recommendations.   Echo results are concerning - significantly reduced LVEF with Grade 3 Diastolic Fxn. NO specific regional WMA.  With significant drop in LVEF, would need an ischemic evaluation --> in the absence of any anginal Sx or regional WMA, would consider non-invasive Myoview as opposed to LHC, especially in light of recent Acute Renal Failure (finally now normalizing).    Will d/w primary cardiologist to determine plan.  For now, will stop Eliquis until +/- CATH decision made, IV Heparin until then.  Still orthopneic & dyspneic -- needs diuresis, would like to add back afterload reduction (ACE-I on hold due to hypotension & ARF), ? Switch BB to Carvedilol prior to d/c.  Add back ACE-I once renal Fxn stable.  IV Lasix BID  Glycemic control improved.  --> per DM service, will increase Lantus to 45 Units bid. Continue current meal coverage.  He is reluctant to stay, but I spent ~20 minutes counseling him on the importance of staying to  complete Rx & Dx course.  Transfer to tele.  Marykay Lex, M.D., M.S. [facility] [address] [place], [place]  [address]  [phone] Pager # [phone] 06/04/2013 9:17 AM

## 2013-06-05 NOTE — CV Procedure (Signed)
CARDIAC CATHETERIZATION REPORT  DARRYL WILLNER   [id number] 03/05/1946  Performing Cardiologist: Chrystie Nose and Marykay Lex Primary Physician: Cassell Smiles., MD Primary Cardiologist:  Dr. [name]  Procedures Performed:  Left Heart Catheterization via 5 Fr left radial artery access  Right Heart Catheterization via 7 Fr right internal jugular access  Indication(s): dyspnea, fatigue, irregular heart beat and new onset cardiomyopathy  Pre-Procedural Diagnosis(es): 1. Atrial fibrillation 2. Ischemic cardiomyopathy, EF 25-30% 3. Bioprosthetic AVR 4. Acute on chronic systolic congestive heart failure  Post-Procedural Diagnosis(es): 1. Elevated left and right heart pressures 2. Patent bypass grafts  Pre-Procedural Non-invasive testing: none  History: 67 y.o. 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 He lives at home with his sister. He has no h/o arrhythmias and his last Echo showed a normal LV size and systolic function with Grade 1 diastolic dysfunction. He was last seen in the office by Dr. [name] in August 2014 at which point he was doing well from the cardiac standpoint but had belching complaints and had issues with his GERD which drastically improved with pepcid. He was in his baseline state of health until 2 days ago when he noted 1-2 loose stools but no bleeding, no abdominal pain. He did have a couple of episodes of dry heaves and 1-2 episodes of small volume non-bloody vomiting. He reports no significant change with his po intake however. Over the last day his bowel habits have returned to baseline and he has not had any further nausea or vomiting. He saw his PCP's office today due to SOB and  palpitations and after an ECG showed tachyarrhythmia they called 911 and an ambulance brought him to the [facility] ED. On arrival his ECG showed a wide-complex rhythm at 157 bpm and telemetry showed irregularly irregular rhythm. Historical ECG's show NSR with LBBB pattern thus the LBBB was not new. In the ED, physician staff concluded that he had new onset AF and started a Diltiazem infusion with some improvement in the HR but significant irregularity and variance in his HR continued. He was loaded on IV amiodarone and spontaneously converted the next day to NSR.  An echocardiogram was performed which showed a newly reduced EF of 25-30%, global hypokinesis, high left heart pressures and no change in his prosthetic aortic valve gradients. He is therefore referred for [facility] to determine if there is new obstructive disease to account for his decrease in EF and to assess his filling pressures.   Consent: The procedure with Risks/Benefits/Alternatives and Indications were reviewed with the patient (and family).  All questions were answered.    Risks / Complications include, but not limited to: Death, MI, CVA/TIA, VF/VT (with defibrillation), Bradycardia (need for temporary pacer placement), contrast induced nephropathy, bleeding / bruising / hematoma / pseudoaneurysm, vascular or coronary injury (with possible emergent CT or Vascular Surgery), adverse medication reactions, infection.    Consent: Risks of procedure as well as the alternatives and risks of each were explained to the (patient/caregiver).  Consent for procedure obtained.  Procedure: The patient was brought to the 2nd Floor [place] [facility] in the fasting state and prepped and draped in the usual sterile fashion for (Left radial) and right Internal  Jugular vein  access. A modified Allen's test with plethysmography was performed on the left wrist demonstrating adequate Ulnar Artery collateral flow.    Time Out: Verified  patient identification, verified procedure, site/side was marked, verified correct patient position, special equipment/implants available, radiation safety measures in place (including badges and shielding), medications/allergies/relevent history reviewed, required imaging and test results available.  Performed  Procedure: The left wrist was anesthetized with 1% subcutaneous Lidocaine.  The left radial artery was accessed using the Seldinger Technique with placement of a 6 Fr Glide Sheath. The sheath was aspirated and flushed.  Then a total of 10 ml of standard Radial Artery Cocktail (see medications) was infused. Subsequently the right IJ was identified using ultrasound access and locally anesthetized using 3 cc 1% lidocaine. Using a micropuncture needle kit under ultrasound guidance, the right internal jugular vein was cannulated and a wire was placed using the Seldinger technique. Dilation was performed and the ostium was upsized to accept a 367F venous sheath.  This was aspirated and flushed and secured. A 5 Fr JR4 Catheter was then advanced of over a Safety J wire into the ascending Aorta through the left radial sheath.  The catheter was used to engage the vein grafts and the LIMA.  Multiple cineangiographic views of the vein grafts and the LIMA were performed. This catheter was then exchanged over the [employer] J wire for an AL1 catheter. This successfully engaged the native RCA.  Cineangiographic imaging was then performed. This catheter would not engage the left main, therefore, it was exchanged for a JL4 which successfully engaged the left main. Again, multiple cineangiographic images of the left system were performed. The catheter and the wire were removed completely out of the body.  Attention was then turned to the right IJ sheath. A 367F Swan-Ganz catheter was advanced through the RA, RV, PA and to the PCWP positions. Pressure measurements were made and PA and Aortic blood samples were drawn  for saturations. Cardiac output was measured using thermodilution. The catheter was then completely removed from the body. An ACT obtained was 181 at the end of the case. Therefore, the decision was made to keep the venous sheath in place until that had reduced to <150.  The arterial sheath was removed in the Cath Lab with a TR band placed at 16 ml Air at 12:20 (time).  Reverse Allen's test did  reveal non-occlusive hemostasis.   Recovery: The patient was transported to the cath lab holding area in stable condition.   The patient  was stable before, during and following the procedure.   Patient did tolerate procedure well. There were not complications.  EBL: Minimal  Medications:  Premedication: none  Sedation:  1 mg IV Versed  Contrast:  80 ml Omnipaque  5500 IU of IV heparin  10 cc radial cocktail  5 cc 1% lidocaine subcutaneous  Hemodynamics:  Central Aortic Pressure / Mean Aortic Pressure: 114/72  LV Pressure / LV End diastolic Pressure:  N/A  Right Heart Data:  RA - 13  RV - 46/10  PA - 47/18 (34)   PCWP - 25  TPG - 9  FCO/CI - 5.13 L/min, 2.2 L/min  TDCO/CI - 5.46 L/min, 2.34 L/min  SVR - 14.2 Wood units ( dynscm?5/80)  PVR -  1.65 Wood units ( dynscm?5/80)  PA Sat% - 57%  AO Sat% - 95%  Coronary Angiographic Data:  Left Main:  Normal  Left Anterior Descending (LAD):  Appears occluded in the mid-vessel, just distal to  a high first diagonal branch, reverse filling seen from the LIMA  1st diagonal (D1):  Smaller vessel, no disease  2nd diagonal (D2):  No stenosis   Circumflex (LCx):  AV groove circumflex tapers to a small vessel after the OM2, and is generally patent.   1st obtuse marginal:  Smaller, high bifurcation, moderate diffuse disease  2nd obtuse marginal:  Larger branch, severe proximal to mid-vessel stenosis - bypassed distally with competitive flow noted.   Right Coronary Artery: Occluded in the mid-vessel just distal to the takeoff  of the RV branch  right ventricle branch of right coronary artery: Patent, there is 70% stenosis of the RCA at this point.  posterior descending artery: Seen filling from the SVG  posterior lateral branch:  Moderate to severe distal stenosis in the vessel <2.0 mm  Grafts  LIMA - LAD: Patent, TIMI III flow, reverse filling seen in the LAD  SVG - OM: Patent, TIMI III flow, good distal runoff  SVG - RCA (RPDA/RPL): Patent, TIMI III flow, good distal runoff  Impression: 1.  Native 3 vessel CAD. 2.  Patent LIMA to LAD, SVG to OM and SVG to PDA - TIMI III flow 3.  Elevated right and left heart filling pressures, with predominantly pulmonary venous hypertension, normal PVR 4.  Mildly reduced cardiac output and index.  Plan: 1.  He will need additional diuresis - increase lasix to 40 IV TID.  2.  Optimize heart failure medications. 3.  Anticipate d/c in 2-3 days once he is euvolemic.  The case and results was not discussed with the patient and family if available.  The case and results was not discussed with the patient's PCP. The case and results was discussed with the patient's Cardiologist.  Time Spent Directly with the Patient:  60 minutes  Chrystie Nose, MD, [facility] Attending Cardiologist [facility]  Kierstan Auer C 06/05/2013, 12:30 PM

## 2013-06-05 NOTE — Progress Notes (Signed)
ANTICOAGULATION CONSULT NOTE - Follow Up Consult  Pharmacy Consult for Heparin Indication: atrial fibrillation  No Known Allergies  Patient Measurements: Height: 5' 10.5" (179.1 cm) Weight: 253 lb 8.5 oz (115 kg) IBW/kg (Calculated) : 74.15 Heparin Dosing Weight: 100 kg  Vital Signs: Temp: 98.1 F (36.7 C) (10/22 0617) BP: 142/76 mmHg (10/22 0617) Pulse Rate: 71 (10/22 0617)  Labs:  Recent Labs  06/03/13 0430 06/04/13 0435 06/04/13 1915 06/05/13 0517  HGB  --   --   --  11.7*  HCT  --   --   --  35.6*  PLT  --   --   --  217  APTT  --   --  78* 106*  LABPROT 13.7  --   --   --   INR 1.07  --   --   --   HEPARINUNFRC 0.34  --  <0.10* 0.89*  CREATININE 1.46* 1.32  --  1.52*    Estimated Creatinine Clearance: 62 ml/min (by C-G formula based on Cr of 1.52).  Assessment:  67 yo M admitted 05/31/2013 with Afib.  Initiated on heparin, transitioned to Apixiban 10/20, now back on heparin due to need for cardiac cath.     Heparin level and aPTT are slightly above goal.  Will adjust drip accordingly.  Goal of Therapy:  Heparin level 0.3-0.7 units/ml APTT 66-102  Monitor platelets by anticoagulation protocol: Yes   Plan:  1) Decrease heparin to 1900 units/hr. 2) Suspect apixaban is eliminated and no further aPTT monitoring needed. 3) Follow-up after cath.  Will need a repeat heparin level ~ 1500 if pt has not gone to cath by then.  [employer], Pharm.D., BCPS Clinical Pharmacist Pager [phone] 06/05/2013 8:52 AM

## 2013-06-05 NOTE — Progress Notes (Signed)
Labs ordered for cath. Pt refusing to watch cath video. Two peripheral iv sites are in place. Pt has pre-cath fluids running. Consent has been signed & is in the chart. [name]

## 2013-06-06 LAB — BASIC METABOLIC PANEL
BUN: 24 mg/dL — ABNORMAL HIGH (ref 6–23)
CO2: 27 mEq/L (ref 19–32)
Chloride: 94 mEq/L — ABNORMAL LOW (ref 96–112)
Creatinine, Ser: 1.35 mg/dL (ref 0.50–1.35)
Glucose, Bld: 189 mg/dL — ABNORMAL HIGH (ref 70–99)

## 2013-06-06 LAB — CBC
HCT: 36.8 % — ABNORMAL LOW (ref 39.0–52.0)
MCHC: 33.4 g/dL (ref 30.0–36.0)
MCV: 82.1 fL (ref 78.0–100.0)
Platelets: 231 10*3/uL (ref 150–400)
RDW: 13.4 % (ref 11.5–15.5)
WBC: 8.9 10*3/uL (ref 4.0–10.5)

## 2013-06-06 LAB — POCT I-STAT 3, ART BLOOD GAS (G3+)
O2 Saturation: 95 %
pCO2 arterial: 40.9 mmHg (ref 35.0–45.0)
pH, Arterial: 7.442 (ref 7.350–7.450)
pO2, Arterial: 71 mmHg — ABNORMAL LOW (ref 80.0–100.0)

## 2013-06-06 LAB — GLUCOSE, CAPILLARY
Glucose-Capillary: 190 mg/dL — ABNORMAL HIGH (ref 70–99)
Glucose-Capillary: 294 mg/dL — ABNORMAL HIGH (ref 70–99)
Glucose-Capillary: 164 mg/dL — ABNORMAL HIGH (ref 70–99)
Glucose-Capillary: 137 mg/dL — ABNORMAL HIGH (ref 70–99)

## 2013-06-06 LAB — POCT I-STAT 3, VENOUS BLOOD GAS (G3P V)
O2 Saturation: 57 %
TCO2: 27 mmol/L (ref 0–100)
pCO2, Ven: 45.2 mmHg (ref 45.0–50.0)
pH, Ven: 7.359 — ABNORMAL HIGH (ref 7.250–7.300)
pO2, Ven: 31 mmHg (ref 30.0–45.0)

## 2013-06-06 LAB — MAGNESIUM: Magnesium: 2.3 mg/dL (ref 1.5–2.5)

## 2013-06-06 LAB — POCT ACTIVATED CLOTTING TIME: Activated Clotting Time: 181 seconds

## 2013-06-06 MED ORDER — TORSEMIDE 20 MG PO TABS
20.0000 mg | ORAL_TABLET | Freq: Every day | ORAL | Status: DC
  Administered 2013-06-06 – 2013-06-07 (×2): 20 mg via ORAL
  Filled 2013-06-06 (×2): qty 1

## 2013-06-06 NOTE — Progress Notes (Signed)
Lifevest has been ordered.  Plan to place tomorrow.

## 2013-06-06 NOTE — Progress Notes (Signed)
Subjective:   Objective: Vital signs in last 24 hours: Temp:  [97.7 F (36.5 C)-98.8 F (37.1 C)] 97.7 F (36.5 C) (10/23 0505) Pulse Rate:  [69-80] 69 (10/23 0505) Resp:  [20] 20 (10/23 0505) BP: (114-151)/(44-82) 134/75 mmHg (10/23 0505) SpO2:  [94 %-97 %] 94 % (10/23 0505) Weight:  [251 lb 12.3 oz (114.2 kg)] 251 lb 12.3 oz (114.2 kg) (10/23 0505) Weight change:  Last BM Date: 06/04/13 Intake/Output from previous day: -3475 (-5086 since admit) wt 251.12 down from 255.8 10/22 0701 - 10/23 0700 In: 0  Out: 3475 [Urine:3475] Intake/Output this shift: Total I/O In: -  Out: 250 [Urine:250]  PE: Per Dr. Allyson Sabal General:Pleasant affect, NAD Skin:Warm and dry, brisk capillary refill HEENT:normocephalic, sclera clear, mucus membranes moist Heart:S1S2 RRR without murmur, gallup, rub or click Lungs:clear without rales, rhonchi, or wheezes ZOX:WRUE, non tender, + BS, do not palpate liver spleen or masses Ext: edema Neuro:alert and oriented, MAE, follows commands, + facial symmetry   Lab Results:  Recent Labs  06/05/13 0517 06/06/13 0400  WBC 7.8 8.9  HGB 11.7* 12.3*  HCT 35.6* 36.8*  PLT 217 231   BMET  Recent Labs  06/05/13 0517 06/06/13 0400  NA 135 133*  K 4.4 4.4  CL 94* 94*  CO2 29 27  GLUCOSE 238* 189*  BUN 27* 24*  CREATININE 1.52* 1.35  CALCIUM 9.6 9.6   No results found for this basename: TROPONINI, CK, MB,  in the last 72 hours  No results found for this basename: CHOL, HDL, LDLCALC, LDLDIRECT, TRIG, CHOLHDL   Lab Results  Component Value Date   HGBA1C 10.1* 06/01/2013     Lab Results  Component Value Date   TSH 2.550 06/02/2013      Studies/Results: Cardiac cath Impression:  1. Native 3 vessel CAD.  2. Patent LIMA to LAD, SVG to OM and SVG to PDA - TIMI III flow  3. Elevated right and left heart filling pressures, with predominantly pulmonary venous hypertension, normal PVR  4. Mildly reduced cardiac output and  index.   Medications: I have reviewed the patient's current medications. Scheduled Meds: . [START ON 06/15/2013] amiodarone  200 mg Oral Daily  . amiodarone  400 mg Oral BID  . [START ON 06/08/2013] amiodarone  400 mg Oral Daily  . apixaban  5 mg Oral BID  . atorvastatin  20 mg Oral q1800  . furosemide  40 mg Intravenous TID  . gabapentin  300 mg Oral TID  . insulin aspart  0-20 Units Subcutaneous TID WC  . insulin aspart  35 Units Subcutaneous TID WC  . insulin glargine  60 Units Subcutaneous BID  . levothyroxine  25 mcg Oral QAC breakfast  . metoprolol tartrate  12.5 mg Oral BID  . pantoprazole  40 mg Oral BID  . sodium chloride  3 mL Intravenous Q12H  . sodium chloride  3 mL Intravenous Q12H   Continuous Infusions:  PRN Meds:.sodium chloride, sodium chloride, acetaminophen, ALPRAZolam, docusate sodium, ondansetron (ZOFRAN) IV, sodium chloride, sodium chloride  Assessment/Plan: Principal Problem:   Atrial fibrillation with RVR- converted with Amiodarone Active Problems:   Stroke- Lt brain 2003   Hemiplegia affecting right dominant side   GERD (gastroesophageal reflux disease)   S/P CABG x 3 - 2010   S/P AVR-tissue, 2010   DM2 (diabetes mellitus, type 2)   Dyslipidemia   LBBB (left bundle branch block)   Hypotension- secondary to AF and Diltiazem  Rx   Obesity (BMI 30-39.9)   Acute on chronic diastolic heart failure - due to Afib AVR   Atrial flutter with rapid ventricular response   Acute renal failure: Cr up to 1.8   Hyperkalemia   Acute on chronic combined systolic and diastolic HF (heart failure), NYHA class 3  PLAN: diuresing, maintaining SR.  Will need lifevest prior to discharge.  Dr Allyson Sabal to see and eval.   LOS: 6 days   Time spent with pt. :15 minutes. [facility] R  Nurse Practitioner Certified Pager [phone] 06/06/2013, 9:45 AM  Agree with note written by Nada Boozer RNP  Good diuresis on IV lasix TID. BNP 2700. NSR now on Eliquis. Severe LVD/ISCM.  Exam notable for clear lungs, 1-2+ pitting BLE edema. Will transition to PO torsemide today. Will get LifeVest to see and evaluate. Discussed with Dr. [name], his primary Cardiologist.  Runell Gess 06/06/2013 10:16 AM

## 2013-06-07 ENCOUNTER — Encounter (HOSPITAL_COMMUNITY): Payer: Self-pay | Admitting: Cardiology

## 2013-06-07 DIAGNOSIS — Z9889 Other specified postprocedural states: Secondary | ICD-10-CM

## 2013-06-07 DIAGNOSIS — Z9189 Other specified personal risk factors, not elsewhere classified: Secondary | ICD-10-CM | POA: Diagnosis present

## 2013-06-07 HISTORY — DX: Other specified personal risk factors, not elsewhere classified: Z91.89

## 2013-06-07 HISTORY — DX: Other specified postprocedural states: Z98.890

## 2013-06-07 LAB — CBC
Platelets: 261 10*3/uL (ref 150–400)
RBC: 4.91 MIL/uL (ref 4.22–5.81)
WBC: 9.3 10*3/uL (ref 4.0–10.5)

## 2013-06-07 LAB — BASIC METABOLIC PANEL
CO2: 27 mEq/L (ref 19–32)
GFR calc Af Amer: 46 mL/min — ABNORMAL LOW (ref >90)
GFR calc non Af Amer: 40 mL/min — ABNORMAL LOW (ref >90)
Potassium: 4.5 mEq/L (ref 3.5–5.1)
Sodium: 135 mEq/L (ref 135–145)

## 2013-06-07 LAB — GLUCOSE, CAPILLARY
Glucose-Capillary: 171 mg/dL — ABNORMAL HIGH (ref 70–99)
Glucose-Capillary: 226 mg/dL — ABNORMAL HIGH (ref 70–99)

## 2013-06-07 MED ORDER — AMIODARONE HCL 200 MG PO TABS: 200.0000 mg | ORAL_TABLET | Freq: Two times a day (BID) | ORAL | Status: DC

## 2013-06-07 MED ORDER — APIXABAN 5 MG PO TABS: 5.0000 mg | ORAL_TABLET | Freq: Two times a day (BID) | ORAL | Status: DC

## 2013-06-07 MED ORDER — TORSEMIDE 10 MG PO TABS: 10.0000 mg | ORAL_TABLET | Freq: Every day | ORAL | Status: DC

## 2013-06-07 MED ORDER — ATORVASTATIN CALCIUM 20 MG PO TABS: 20.0000 mg | ORAL_TABLET | Freq: Every day | ORAL | Status: DC

## 2013-06-07 MED ORDER — METOPROLOL TARTRATE 25 MG PO TABS: 12.5000 mg | ORAL_TABLET | Freq: Two times a day (BID) | ORAL | Status: DC

## 2013-06-07 NOTE — Progress Notes (Signed)
Pt provided with dc instructions and edcuation. Pt verbalized understanding. Pt has no questions at this time. Educated on new medications and how/when to take them. Pt leaving with lifevest in place. Knows that Dr Allyson Sabal office will call for follow up. IV removed with tip itnact. Heart monitor cleaned and returend to front. Levonne Spiller, RN

## 2013-06-07 NOTE — Progress Notes (Signed)
Pt. Seen and examined. Agree with the NP/PA-C note as written.  Weight continues to go down. He diuresed another 2L yesterday - total out 7L.  Leg edema markedly improved. Weight yesterday was 251 lbs. Will decrease torsemide to 10 mg daily. Decrease or stop potassium supplements. LifeVest in place, he wants to go home. Will see in 5-7 days for TCM follow-up (he is high risk for re-admission with decompensated heart failure).  Continue Eliquis.   Chrystie Nose, MD, [facility] Attending Cardiologist [facility]

## 2013-06-07 NOTE — Progress Notes (Signed)
Subjective: No complaints, wants to go home  Objective: Vital signs in last 24 hours: Temp:  [97.7 F (36.5 C)-98.2 F (36.8 C)] 97.9 F (36.6 C) (10/24 0523) Pulse Rate:  [63-77] 63 (10/24 0523) Resp:  [18-20] 18 (10/24 0523) BP: (123-133)/(53-74) 123/73 mmHg (10/24 0523) SpO2:  [95 %-98 %] 95 % (10/24 0523) Weight change:  Last BM Date: 06/06/13 Intake/Output from previous day: -1740 ( -7276 since admit)  Wt 251.12 down from 255.8 on admit 10/23 0701 - 10/24 0700 In: 960 [P.O.:960] Out: 3400 [Urine:3400] Intake/Output this shift: Total I/O In: -  Out: 300 [Urine:300]  PE: General:Pleasant affect, NAD Skin:Warm and dry, brisk capillary refill HEENT:normocephalic, sclera clear, mucus membranes moist Heart:S1S2 RRR without murmur, gallup, rub or click Lungs:clear without rales, rhonchi, or wheezes ZOX:WRUE, non tender, + BS, do not palpate liver spleen or masses Ext:no lower ext edema on Lt , tr on rt.  Neuro:alert and oriented, MAE, follows commands, + facial symmetry   Lab Results:  Recent Labs  06/06/13 0400 06/07/13 0552  WBC 8.9 9.3  HGB 12.3* 13.4  HCT 36.8* 39.9  PLT 231 261   BMET  Recent Labs  06/06/13 0400 06/07/13 0552  NA 133* 135  K 4.4 4.5  CL 94* 92*  CO2 27 27  GLUCOSE 189* 143*  BUN 24* 29*  CREATININE 1.35 1.72*  CALCIUM 9.6 10.0   No results found for this basename: TROPONINI, CK, MB,  in the last 72 hours  No results found for this basename: CHOL, HDL, LDLCALC, LDLDIRECT, TRIG, CHOLHDL   Lab Results  Component Value Date   HGBA1C 10.1* 06/01/2013     Lab Results  Component Value Date   TSH 2.550 06/02/2013    Studies/Results: No results found.  Medications: I have reviewed the patient's current medications. Scheduled Meds: . [START ON 06/15/2013] amiodarone  200 mg Oral Daily  . amiodarone  400 mg Oral BID  . [START ON 06/08/2013] amiodarone  400 mg Oral Daily  . apixaban  5 mg Oral BID  . atorvastatin   20 mg Oral q1800  . gabapentin  300 mg Oral TID  . insulin aspart  0-20 Units Subcutaneous TID WC  . insulin aspart  35 Units Subcutaneous TID WC  . insulin glargine  60 Units Subcutaneous BID  . levothyroxine  25 mcg Oral QAC breakfast  . metoprolol tartrate  12.5 mg Oral BID  . pantoprazole  40 mg Oral BID  . sodium chloride  3 mL Intravenous Q12H  . sodium chloride  3 mL Intravenous Q12H  . torsemide  20 mg Oral Daily   Continuous Infusions:  PRN Meds:.sodium chloride, sodium chloride, acetaminophen, ALPRAZolam, docusate sodium, ondansetron (ZOFRAN) IV, sodium chloride, sodium chloride  Assessment/Plan: Principal Problem:   Atrial fibrillation with RVR- converted with Amiodarone Active Problems:   Stroke- Lt brain 2003   Hemiplegia affecting right dominant side   GERD (gastroesophageal reflux disease)   S/P CABG x 3 - 2010   S/P AVR-tissue, 2010   DM2 (diabetes mellitus, type 2)   Dyslipidemia   LBBB (left bundle branch block)   Hypotension- secondary to AF and Diltiazem Rx   Obesity (BMI 30-39.9)   Acute on chronic diastolic heart failure - due to Afib AVR   Atrial flutter with rapid ventricular response   Acute renal failure: Cr up to 1.8   Hyperkalemia   Acute on chronic combined systolic and diastolic HF (heart failure),  NYHA class 3   At risk for sudden cardiac death  PLAN:continues to diuresis, changed to Torsemide yesterday. Life vest in place, Cr elevated, MD to see and adjust meds if needed.  ? Discharge today.  Maintaining SR. On Eliquis.   LOS: 7 days   . [facility] R  Nurse Practitioner Certified Pager [phone] 06/07/2013, 12:19 PM

## 2013-06-07 NOTE — Discharge Summary (Signed)
Physician Discharge Summary       Patient ID: [name] MRN: [id number] DOB/AGE: [birth year] 67 y.o.  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 Mickie Hillier Course: [name] is a 67 y.o. male with PMHx of chronic IHD s/p 3-vessel CABG (LIMA to LAD, SVG to LCx, SVG to rPDA), valvular HD s/p 23mm Edwards SAVR in 2009/01/09 (last Echo Feb 2013 with prosthetic AV peak gradient=26, mean gradient=14), h/o CVA in Jan 09, 2002 with residual ambulatory difficulty and right-sided weakness, obesity and GERD. He lives at home with his sister. He has no h/o arrhythmias and his last Echo showed a normal LV size and systolic function with Grade 1 diastolic dysfunction. He was last seen in the office by Dr. [name] in August 2014 at which point he was doing well from the cardiac standpoint but had belching complaints and had issues with his GERD which drastically improved with pepcid. He was in his baseline state of health until 2 days prior to admit when he noted 1-2 loose stools but no bleeding, no abdominal pain. He did have a couple of episodes of dry heaves and 1-2 episodes of small volume non-bloody  vomiting. He reported no significant change with his po intake however. Over the last day his bowel habits have returned to baseline and he has not had any further nausea or vomiting. He saw his PCP's office 05/31/13 due to SOB and palpitations and after an ECG showed tachyarrhythmia they called 911 and an ambulance brought him to the [facility] ED. On arrival his ECG showed a wide-complex rhythm at 157 bpm and telemetry showed irregularly irregular rhythm. Historical ECG's show NSR with LBBB pattern thus the LBBB was not new. In the ED, physician staff concluded that he had new onset AF and started a Diltiazem infusion with some improvement in the HR but significant irregularity and variance in his HR continued.  IV amiodarone was started and coreg changed to lopressor.  ACE was held.  Lasix given. He eventually changed to atrial flutter.   Pt hyperkalemic and kayexalate given.  Plan for anticoagulation after rt and lt heart cath to eval for ischemic cause of atrial fib.  EF by echo no 25-30%.  He also has grade 3 diastolic dysfunction.  Pt continued to improve and underwent cardiac cath: Impression:  1. Native 3 vessel CAD.  2. Patent LIMA to LAD, SVG to OM and SVG to PDA - TIMI III flow  3. Elevated right and left heart filling pressures, with predominantly pulmonary venous hypertension, normal PVR  4. Mildly reduced cardiac output and index  IV  lasix added with more diuresis.  Pt improved, could lie flat to sleep.  Due to low EF and risk for sudden cardiac death,  Life Vest was obtained.  Pt ambulating without problems. By today no complaints. Unable to resume ACE due to elevated Cr.  We will address on office visit.  Pt changed to po torsemide 10 mg.  Diabetes coordinator talked with pt as well.  We will se pt back in 5-7 days.   Consults: None  Significant Diagnostic Studies: ECHO: Study Conclusions - Left ventricle: The cavity size was normal. There was moderate concentric hypertrophy.  Systolic function was severely reduced. The estimated ejection fraction was in the range of 25% to 30%. Regional wall motion abnormalities cannot be excluded. Doppler parameters are consistent with a reversible restrictive pattern, indicative of decreased left ventricular diastolic compliance and/or increased left atrial pressure (grade 3 diastolic dysfunction). Doppler parameters are consistent with high ventricular filling pressure. - Aortic valve: The transaortic gradients are normal for the bioprosthetic valve. A bioprosthesis was present. Mean gradient: 10mm Hg (S). Peak gradient: 20mm Hg (S). Valve area: 2.3cm^2(VTI). Valve area: 1.88cm^2 (Vmax). - Mitral valve: Calcified annulus. Mildly thickened leaflets . - Left atrium: The appendage was moderately dilated. Transthoracic echocardiography.    BMET    Component Value Date/Time   NA 135 06/07/2013 0552   K 4.5 06/07/2013 0552   CL 92* 06/07/2013 0552   CO2 27 06/07/2013 0552   GLUCOSE 143* 06/07/2013 0552   BUN 29* 06/07/2013 0552   CREATININE 1.72* 06/07/2013 0552   CALCIUM 10.0 06/07/2013 0552   GFRNONAA 40* 06/07/2013 0552   GFRAA 46* 06/07/2013 0552    CBC    Component Value Date/Time   WBC 9.3 06/07/2013 0552   RBC 4.91 06/07/2013 0552   RBC  Value: 3.51 CORRECTED ON 07/12 AT 1638: PREVIOUSLY REPORTED AS 3.32* 02/23/2009 0525   HGB 13.4 06/07/2013 0552   HCT 39.9 06/07/2013 0552   PLT 261 06/07/2013 0552   MCV 81.3 06/07/2013 0552   MCH 27.3 06/07/2013 0552   MCHC 33.6 06/07/2013 0552   RDW 13.3 06/07/2013 0552   LYMPHSABS 1.9 06/01/2013 0820   MONOABS 1.2* 06/01/2013 0820   EOSABS 0.1 06/01/2013 0820   BASOSABS 0.0 06/01/2013 0820   BNP (last 3 results)  Recent Labs  06/02/13 0540  PROBNP 2717.0*   TSH  2.55  Troponin I neg X 3  hgb A1C 10.1  EXAM: PORTABLE CHEST - 1 VIEW  COMPARISON: Chest radiograph 03/21/2011.  FINDINGS: Patient is rotated to the right. Stable enlarged cardiac  and mediastinal contours status post median sternotomy and CABG procedure. Minimal right basilar atelectasis. Elevation of the right hemidiaphragm. No large consolidative pulmonary opacities. No pleural effusion or pneumothorax. Regional skeleton is unremarkable.  IMPRESSION: Cardiomegaly. Elevation of the right hemidiaphragm with minimal right basilar atelectasis.   Discharge Exam: Blood pressure 123/73, pulse 63, temperature 97.9 F (36.6 C), temperature source Oral, resp. rate 18, height 5' 10.5" (1.791 m), weight 251 lb 12.3 oz (114.2 kg), SpO2 95.00%.  AM exam:PE: General:Pleasant affect, NAD  Skin:Warm and dry, brisk capillary refill  HEENT:normocephalic, sclera clear, mucus membranes moist  Heart:S1S2 RRR without murmur, gallup, rub or click  Lungs:clear without rales, rhonchi, or wheezes  WUX:LKGM, non tender, + BS, do not palpate liver spleen or masses  Ext:no lower ext edema on Lt , tr on rt.  Neuro:alert and oriented, MAE, follows commands, + facial symmetry   Disposition: 01-Home or Self Care  Medication List    STOP taking these medications       carvedilol 3.125 MG tablet  Commonly known as:  COREG     lisinopril 10 MG tablet  Commonly known as:  PRINIVIL,ZESTRIL     simvastatin 40 MG tablet  Commonly known as:  ZOCOR      TAKE these medications       acetaminophen 500 MG tablet  Commonly known as:  TYLENOL  Take 1,000 mg by mouth every 6 (six) hours as needed. For pain/headaches     ALPRAZolam 1 MG tablet  Commonly known as:  XANAX  Take 1 mg by mouth 4 (four) times daily as needed for anxiety.     amiodarone 200 MG tablet  Commonly known as:  PACERONE  Take 1-2 tablets (200-400 mg total) by mouth 2 (two) times daily. 400 mg BID for 10/24 and 10/25  Then decrease to 400 mg daily for 7 days, 10/26-11/2, then 200 mg daily     apixaban 5 MG Tabs tablet  Commonly known as:  ELIQUIS  Take 1 tablet (5 mg total) by mouth 2 (two) times daily.      aspirin 81 MG EC tablet  Take 81 mg by mouth daily.     atorvastatin 20 MG tablet  Commonly known as:  LIPITOR  Take 1 tablet (20 mg total) by mouth daily at 6 PM.     bismuth subsalicylate 262 MG/15ML suspension  Commonly known as:  PEPTO BISMOL  Take 30 mLs by mouth every 6 (six) hours as needed for indigestion.     docusate sodium 100 MG capsule  Commonly known as:  COLACE  Take 100 mg by mouth 2 (two) times daily.     fenofibrate 160 MG tablet  Take 160 mg by mouth daily.     fish oil-omega-3 fatty acids 1000 MG capsule  Take 2 g by mouth daily.     gabapentin 300 MG capsule  Commonly known as:  NEURONTIN  Take 300 mg by mouth 3 (three) times daily.     insulin glargine 100 UNIT/ML injection  Commonly known as:  LANTUS  Inject 60-70 Units into the skin 2 (two) times daily. Takes70 units in the morning and 60 units in the evening     insulin lispro 100 UNIT/ML injection  Commonly known as:  HUMALOG  Inject 35-45 Units into the skin 3 (three) times daily before meals. 45U bid and 35U QD     levothyroxine 25 MCG tablet  Commonly known as:  SYNTHROID, LEVOTHROID  Take 25 mcg by mouth daily.     metoprolol tartrate 25 MG tablet  Commonly known as:  LOPRESSOR  Take 0.5 tablets (12.5 mg total) by mouth 2 (two) times daily.     pantoprazole 40 MG tablet  Commonly known as:  PROTONIX  Take 40 mg by mouth 2 (two) times daily.     torsemide 10 MG tablet  Commonly known as:  DEMADEX  Take 1 tablet (10 mg total) by mouth daily.       Follow-up Information   Follow up with Chrystie Nose, MD On . (our office will call you with appt date and time)    Specialty:  Cardiology   Contact information:   [address] [place] [address] [phone]        Discharge Instructions:Weigh daily Call [phone] if weight climbs more than 3 pounds in a day or 5 pounds in a week. No salt to  very little salt in your diet.  No more than 2000 mg in a day. Call if  increased shortness of breath or increased swelling.   Low salt diabetic diet  Call if any blood in your stools or urine.  Take amiodarone 2 tabs twice a day for 10/24 and 10/25 then two tabs daily for 7 days then 1 tab daily.  This is to keep you in regular rhythm.   Wear your life vest.   Signed: [id number],BJYNW R Nurse Practitioner-Certified [facility]: [facility] 06/07/2013, 10:40 PM  Time spent on discharge :40 minutes.

## 2013-06-10 ENCOUNTER — Telehealth: Payer: Self-pay | Admitting: Physician Assistant

## 2013-06-10 ENCOUNTER — Ambulatory Visit: Payer: Medicare Other | Admitting: Physician Assistant

## 2013-06-10 NOTE — Telephone Encounter (Signed)
TCM phone call.  I spoke to the patient's sister because he was sleeping.  She reports he is doing well.  She also confirmed his appt for this Thursday.  Natalee Tomkiewicz 1:38 PM

## 2013-06-12 ENCOUNTER — Encounter: Payer: Self-pay | Admitting: *Deleted

## 2013-06-12 ENCOUNTER — Telehealth: Payer: Self-pay | Admitting: Cardiovascular Disease

## 2013-06-12 NOTE — Telephone Encounter (Signed)
Returned call to clarify that fax Eileen Stanford, RN sent on 06/12/13 had been received (note from Dr. [name] that was ok to take both, just need to closely monitor LFTs)

## 2013-06-12 NOTE — Telephone Encounter (Signed)
Please call-there is an interaction between his Lipitor and Amiodarone.

## 2013-06-13 ENCOUNTER — Encounter: Payer: Self-pay | Admitting: Internal Medicine

## 2013-06-13 ENCOUNTER — Ambulatory Visit (INDEPENDENT_AMBULATORY_CARE_PROVIDER_SITE_OTHER): Payer: Medicare Other | Admitting: Internal Medicine

## 2013-06-13 ENCOUNTER — Telehealth: Payer: Self-pay | Admitting: Internal Medicine

## 2013-06-13 VITALS — BP 140/74 | HR 81 | Ht 70.5 in | Wt 239.8 lb

## 2013-06-13 DIAGNOSIS — E669 Obesity, unspecified: Secondary | ICD-10-CM

## 2013-06-13 DIAGNOSIS — I447 Left bundle-branch block, unspecified: Secondary | ICD-10-CM

## 2013-06-13 DIAGNOSIS — I4891 Unspecified atrial fibrillation: Secondary | ICD-10-CM

## 2013-06-13 DIAGNOSIS — Z951 Presence of aortocoronary bypass graft: Secondary | ICD-10-CM

## 2013-06-13 DIAGNOSIS — I255 Ischemic cardiomyopathy: Secondary | ICD-10-CM

## 2013-06-13 DIAGNOSIS — Z952 Presence of prosthetic heart valve: Secondary | ICD-10-CM

## 2013-06-13 DIAGNOSIS — I5043 Acute on chronic combined systolic (congestive) and diastolic (congestive) heart failure: Secondary | ICD-10-CM

## 2013-06-13 DIAGNOSIS — N179 Acute kidney failure, unspecified: Secondary | ICD-10-CM

## 2013-06-13 DIAGNOSIS — Z79899 Other long term (current) drug therapy: Secondary | ICD-10-CM

## 2013-06-13 DIAGNOSIS — G5691 Unspecified mononeuropathy of right upper limb: Secondary | ICD-10-CM

## 2013-06-13 DIAGNOSIS — I4892 Unspecified atrial flutter: Secondary | ICD-10-CM

## 2013-06-13 DIAGNOSIS — I2510 Atherosclerotic heart disease of native coronary artery without angina pectoris: Secondary | ICD-10-CM

## 2013-06-13 MED ORDER — METOPROLOL SUCCINATE ER 25 MG PO TB24: 25.0000 mg | ORAL_TABLET | Freq: Every day | ORAL | Status: DC

## 2013-06-13 NOTE — Progress Notes (Signed)
OFFICE NOTE  Chief Complaint:  Routine followup  Primary Care Physician: Cassell Smiles., MD  HPI:  [name] a pleasant 67 year old gentleman with history of coronary artery disease status post CABG x3 vessels with Dr. Donata [name] with a LIMA to the LAD, SVG to circumflex and SVG to PDA. He also had an [facility] Ease bioprosthetic 23-mm valve placed at that time in 2010. He is seen today in followup from the hospital for TCM 7 due to the high risk of admission.  He did receive a phone call after discharge within 24 hours which found him well, continuing to urinate excessively with ongoing weight loss and improved dyspnea.  His history goes back to 2003, unfortunately,when he had a stroke which has caused much difficulty walking as well as some heaviness and some swelling in the right upper extremity particularly. Unfortunately, he has not been able to lose weight. He is active walking but does not necessarily do any exercise. In February 2013 he underwent another echocardiogram to look at his valve gradient. It was increased significantly compared to his prior study. The peak gradient was 26, mean gradient of 14. However, he was not complaining of any worsening dyspnea or anything associated with that. He is describing some symptoms which are concerning for worsening acid reflux including belching and increased heartburn. At his last office visit I increased his protonix to twice daily. This is improved his reflux some, but he still has symptoms associated with some belching and/or flatus which improves. Recently he was admitted to the hospital for atrial fibrillation with rapid ventricular response which was noted when he showed up at his doctor's office after having 1-2 loose stools but no bleeding, no abdominal pain. He did have a couple of episodes of dry heaves and 1-2 episodes of small volume non-bloody vomiting. He reported no significant change with his po intake however. Over the  last day his bowel habits have returned to baseline and he has not had any further nausea or vomiting. He saw his PCP's office 05/31/13 due to SOB and palpitations and after an ECG showed tachyarrhythmia they called 911 and an ambulance brought him to the [facility] ED. On arrival his ECG showed a wide-complex rhythm at 157 bpm and telemetry showed irregularly irregular rhythm. Historical ECG's show NSR with LBBB pattern thus the LBBB was not new. In the ED, physician staff concluded that he had new onset AF and started a Diltiazem infusion with some improvement in the HR but significant irregularity and variance in his HR continued. IV amiodarone was started and coreg changed to lopressor. ACE was held. Lasix given. He eventually changed to atrial flutter and then spontaneously converted to sinus rhythm. Pt hyperkalemic and kayexalate given. Plan for anticoagulation after rt and lt heart cath to eval for ischemic cause of atrial fib. EF by echo no 25-30%. He also has grade 3 diastolic dysfunction.   Pt continued to improve and underwent cardiac cath: Impression:  1. Native 3 vessel CAD.  2. Patent LIMA to LAD, SVG to OM and SVG to PDA - TIMI III flow  3. Elevated right and left heart filling pressures, with predominantly pulmonary venous hypertension, normal PVR  4. Mildly reduced cardiac output and index   IV lasix added with more diuresis. Pt improved, could lie flat to sleep. Due to low EF and risk for sudden cardiac death, Life Vest was obtained. Pt ambulating without problems. By today no complaints. Unable to resume ACE  due to elevated Cr. We will address on office visit. Pt changed to po torsemide 10 mg. Diabetes coordinator talked with pt as well. We will se pt back in 5-7 days.  PMHx:  Past Medical History  Diagnosis Date  . Hypertension   . Diabetes mellitus     type 2   . History of valve replacement 2010    [facility] Ease bioprosthetic 23mm  . Thyroid disease   . Stroke 2003     R-sided weakness & upper extremity swelling   . Coronary artery disease   . Dyslipidemia   . At risk for sudden cardiac death 07-01-13  . PAF (paroxysmal atrial fibrillation) 05/2013    converted with amiodarone to SR  . Chronic anticoagulation 05/2013    started  . S/P cardiac catheterization, Rt & Lt heart cath 06/05/2013 01-Jul-2013  . S/P CABG x 3 2010  . GERD (gastroesophageal reflux disease)     Past Surgical History  Procedure Laterality Date  . Coronary artery bypass graft  01/26/2009    LIMA to LAD, SVG to circumflex, SVG to PDA  . Aortic valve replacement  01/26/2009    [facility] Ease bioprosthetic 23mm valve  . Transthoracic echocardiogram  09/2011    grade 1 diastolic dysfunction; increasing valve gradient; calcified MV annulus   . Cardiac catheterization  06/05/2013    native 3 vessel disease; patent LIMA to LAD, SVG to OM and SVG to PDA; Elevated right and left heart filling pressures, with predominantly pulmonary venous hypertension, normal PVR  . Transthoracic echocardiogram  06/03/2013    EF 25-30%, mod conc. hypertrophy, grade 3 diastolic dysfunction; LA mod dilated; calcified MV annulus; transaortic gradients are normal for the bioprosthetic valve; inf vena cava dilated (elevated CVP) - LifeVest    FAMHx:  Family History  Problem Relation Age of Onset  . Heart attack Mother   . Liver disease Brother   . Diabetes Sister     x4    SOCHx:   reports that he quit smoking about 11 years ago. His smoking use included Cigarettes. He smoked 0.00 packs per day. He has quit using smokeless tobacco. His smokeless tobacco use included Chew. He reports that he does not drink alcohol or use illicit drugs.  ALLERGIES:  No Known Allergies  ROS: A comprehensive review of systems was negative except for: Constitutional: positive for weight loss Respiratory: positive for dyspnea on exertion Gastrointestinal: positive for dyspepsia and reflux symptoms  HOME  MEDS: Current Outpatient Prescriptions  Medication Sig Dispense Refill  . metoprolol succinate (TOPROL XL) 25 MG 24 hr tablet Take 1 tablet (25 mg total) by mouth daily.  30 tablet  6  . acetaminophen (TYLENOL) 500 MG tablet Take 1,000 mg by mouth every 6 (six) hours as needed. For pain/headaches       . ALPRAZolam (XANAX) 1 MG tablet Take 1 mg by mouth 4 (four) times daily as needed for anxiety.       Marland Kitchen amiodarone (PACERONE) 200 MG tablet Take 100 mg by mouth daily.      Marland Kitchen apixaban (ELIQUIS) 5 MG TABS tablet Take 1 tablet (5 mg total) by mouth 2 (two) times daily.  60 tablet  6  . aspirin 81 MG EC tablet Take 81 mg by mouth daily.        Marland Kitchen atorvastatin (LIPITOR) 20 MG tablet Take 1 tablet (20 mg total) by mouth daily at 6 PM.  30 tablet  6  . bismuth subsalicylate (PEPTO  BISMOL) 262 MG/15ML suspension Take 30 mLs by mouth every 6 (six) hours as needed for indigestion.      . docusate sodium (COLACE) 100 MG capsule Take 100 mg by mouth 2 (two) times daily.        . fenofibrate 160 MG tablet Take 160 mg by mouth daily.      . fish oil-omega-3 fatty acids 1000 MG capsule Take 2 g by mouth daily.        Marland Kitchen gabapentin (NEURONTIN) 300 MG capsule Take 300 mg by mouth 3 (three) times daily.        . insulin glargine (LANTUS) 100 UNIT/ML injection Inject 60-70 Units into the skin 2 (two) times daily. Takes70 units in the morning and 60 units in the evening      . insulin lispro (HUMALOG) 100 UNIT/ML injection Inject 35-45 Units into the skin 3 (three) times daily before meals. 45U bid and 35U QD      . levothyroxine (SYNTHROID, LEVOTHROID) 25 MCG tablet Take 25 mcg by mouth daily.        . pantoprazole (PROTONIX) 40 MG tablet Take 40 mg by mouth 2 (two) times daily.      Marland Kitchen torsemide (DEMADEX) 10 MG tablet Take 1 tablet (10 mg total) by mouth daily.  30 tablet  6   No current facility-administered medications for this visit.    LABS/IMAGING: No results found for this or any previous visit (from the  past 48 hour(s)). No results found.  VITALS: BP 140/74  Pulse 81  Ht 5' 10.5" (1.791 m)  Wt 239 lb 12.8 oz (108.773 kg)  BMI 33.91 kg/m2  EXAM: General appearance: alert and no distress, wearing lifevest Neck: no carotid bruit, no JVD Lungs: clear to auscultation bilaterally Heart: regular rate and rhythm, S1, S2 normal, 2/6 SEM at RUSB, no click, rub or gallop Abdomen: soft, non-tender; bowel sounds normal; no masses,  no organomegaly and obese Extremities: extremities normal, atraumatic, no cyanosis or edema Pulses: 2+ and symmetric Skin: Skin color, texture, turgor normal. No rashes or lesions Neurologic: Grossly normal  EKG: Normal sinus rhythm at 88, left bundle branch block, first degree AV block  ASSESSMENT: 1. Coronary artery disease status post three-vessel CABG in 2010 2. Status post bioprosthetic aVR in 2010, Edwards MagnaEase valve- 23 mm, gradients of 26/14 mmHg 3. GERD - on protoix 4. Dyslipidemia 5. Diabetes type 2 6. History of stroke with right-sided hemiparesis - complaining of numbness and tingling in his 4th/5th digits on the right hand 7. Atrial fibrillation with rapid ventricular response-spontaneously converted with amiodarone 8. Acute on chronic systolic congestive heart failure-newly reduced EF to 25-30%, and NYHA class II symptoms on a lifevest  PLAN: 1.   Mr. Theisen is doing much better after his hospitalization. On discharge his weight was 255 and is now down to 239 pounds. He continues to diurese significantly and denies any shortness of breath since leaving the hospital. His only complaint is a lifevest, which she reports does not fit appropriately. He will be due for repeat labs to make sure that he is not overdiuresis in order developing worsening renal function. Based on his amiodarone sliding scale by to decrease his dose to 200 mg once daily. He was started on Elavil his in addition to aspirin he continues to take that without bleeding problems.  He's taking torsemide 10 mg once daily. With regards to his Lopressor he was advised to take one half tablet twice daily however he does not  have the ability to cut her pills. Therefore we'll switch him to Toprol-XL 25 mg daily. He is on high-dose insulin, approximately 70 units in the morning and 60 units of Lantus at night with a Humalog sliding scale of 45 units twice a day and 35 units daily. He seems to be doing very well I think at this point is at low risk for rehospitalization. I would like to see him back in one month and have given him a sheet for him to record his daily weights and bring back with him. He was advised if he gains over 2-3 pounds and a two-day period that he may need to take extra torsemide contact our office.  Chrystie Nose, MD, [facility] Attending Cardiologist [facility]  Nikol Lemar C 06/13/2013, 4:34 PM

## 2013-06-13 NOTE — Patient Instructions (Addendum)
Please have blood work done TODAY.  Your physician recommends that you schedule a follow-up appointment in: 1 month.  You have been referred to Dr. Romeo Apple for your hand.   STOP metoprolol tartrate (lopressor). START taking TOPROL XL 25mg  once daily.  TAKE 1 of your 200mg  amiodarone tablets every day.  Please monitor your daily weights.

## 2013-06-13 NOTE — Telephone Encounter (Signed)
Returned call to [place].  Informed Lopressor dc'd today at [facility] and started Toprol XL. Verbalized understanding.

## 2013-06-13 NOTE — Telephone Encounter (Signed)
Just received E script on Toprol XL 25 mg  Patient just picked the Metroprolol tartrate 25 mg  Please call regarding these two combinations .

## 2013-06-14 ENCOUNTER — Telehealth: Payer: Self-pay | Admitting: *Deleted

## 2013-06-14 ENCOUNTER — Telehealth: Payer: Self-pay | Admitting: Internal Medicine

## 2013-06-14 DIAGNOSIS — I5043 Acute on chronic combined systolic (congestive) and diastolic (congestive) heart failure: Secondary | ICD-10-CM

## 2013-06-14 DIAGNOSIS — I2589 Other forms of chronic ischemic heart disease: Secondary | ICD-10-CM

## 2013-06-14 LAB — COMPREHENSIVE METABOLIC PANEL
AST: 42 U/L — ABNORMAL HIGH (ref 0–37)
Alkaline Phosphatase: 57 U/L (ref 39–117)
BUN: 26 mg/dL — ABNORMAL HIGH (ref 6–23)
Chloride: 98 mEq/L (ref 96–112)
Creat: 1.65 mg/dL — ABNORMAL HIGH (ref 0.50–1.35)
Sodium: 138 mEq/L (ref 135–145)

## 2013-06-14 NOTE — Telephone Encounter (Signed)
Called patient with lab results and placed order for home health - [facility] - per Dr [name]

## 2013-06-14 NOTE — Telephone Encounter (Signed)
Faxed referral to [facility].

## 2013-06-14 NOTE — Telephone Encounter (Signed)
Message copied by Lindell Spar on Fri Jun 14, 2013  4:53 PM ------      Message from: Chrystie Nose      Created: Fri Jun 14, 2013  4:47 PM       His labs look great! No changes at this time.  Good idea to try to arrange home CHF services with [facility].            -Dr. [name] ------

## 2013-06-17 ENCOUNTER — Telehealth: Payer: Self-pay | Admitting: Orthopedic Surgery

## 2013-06-17 NOTE — Telephone Encounter (Signed)
Please review referral (electronic) by Dr. [name], cardiologist, for hand evaluation, nerve pain.  Patient states he has been doing physical therapy, which was ordered upon discharge from hospital. Please advise regarding scheduling for hand.  Patient ph# is [phone].

## 2013-06-18 ENCOUNTER — Other Ambulatory Visit: Payer: Self-pay | Admitting: *Deleted

## 2013-06-18 DIAGNOSIS — I255 Ischemic cardiomyopathy: Secondary | ICD-10-CM

## 2013-06-21 ENCOUNTER — Telehealth: Payer: Self-pay | Admitting: Internal Medicine

## 2013-06-21 NOTE — Telephone Encounter (Signed)
Please call-need a diagnosis code for his lab work.

## 2013-06-21 NOTE — Telephone Encounter (Signed)
Returned call to [place] and provided BNP diagnosis code of 786.05

## 2013-06-21 NOTE — Telephone Encounter (Signed)
Message forwarded to J. Elkins, RN.  

## 2013-06-26 ENCOUNTER — Telehealth: Payer: Self-pay | Admitting: *Deleted

## 2013-06-26 NOTE — Telephone Encounter (Signed)
Faxed to [facility] signed order form

## 2013-07-02 ENCOUNTER — Telehealth: Payer: Self-pay | Admitting: *Deleted

## 2013-07-02 NOTE — Telephone Encounter (Signed)
Faxed to [facility] singed Home Health Certification & Plan of Care and Plan of Treatment on 07/02/13

## 2013-07-06 DIAGNOSIS — I2589 Other forms of chronic ischemic heart disease: Secondary | ICD-10-CM

## 2013-07-06 DIAGNOSIS — N179 Acute kidney failure, unspecified: Secondary | ICD-10-CM

## 2013-07-06 DIAGNOSIS — Z951 Presence of aortocoronary bypass graft: Secondary | ICD-10-CM

## 2013-07-06 DIAGNOSIS — G5690 Unspecified mononeuropathy of unspecified upper limb: Secondary | ICD-10-CM

## 2013-07-06 DIAGNOSIS — Z954 Presence of other heart-valve replacement: Secondary | ICD-10-CM

## 2013-07-06 DIAGNOSIS — Z79899 Other long term (current) drug therapy: Secondary | ICD-10-CM

## 2013-07-06 DIAGNOSIS — I5043 Acute on chronic combined systolic (congestive) and diastolic (congestive) heart failure: Secondary | ICD-10-CM

## 2013-07-06 DIAGNOSIS — I447 Left bundle-branch block, unspecified: Secondary | ICD-10-CM

## 2013-07-06 DIAGNOSIS — E669 Obesity, unspecified: Secondary | ICD-10-CM

## 2013-07-15 ENCOUNTER — Telehealth: Payer: Self-pay | Admitting: *Deleted

## 2013-07-15 ENCOUNTER — Ambulatory Visit: Payer: Medicare Other | Admitting: Internal Medicine

## 2013-07-15 NOTE — Telephone Encounter (Signed)
Pt cancelled his appointment and stated that he has to take his monitor off today it is bugging him. Can someone please call him back about taking this off.

## 2013-07-15 NOTE — Telephone Encounter (Signed)
Patient is wearing a life vest and he says it is aggravating him.  He says it is in the way and he wants to take it off.  I urged him to keep it on.  I explained the importance of wearing the monitor and how much at risk he was of sudden death if he took it off.  I explained that he wouldn't be able to put it back on if something was going wrong.  He would probably not know that something was happening until it was too late.  Patient agreed to wear the life vest at least until his appointment with Dr [name] on 07/24/13.

## 2013-07-24 ENCOUNTER — Encounter: Payer: Self-pay | Admitting: Internal Medicine

## 2013-07-24 ENCOUNTER — Ambulatory Visit (INDEPENDENT_AMBULATORY_CARE_PROVIDER_SITE_OTHER): Payer: Medicare Other | Admitting: Internal Medicine

## 2013-07-24 ENCOUNTER — Other Ambulatory Visit: Payer: Self-pay | Admitting: *Deleted

## 2013-07-24 VITALS — BP 158/70 | HR 57 | Ht 70.5 in | Wt 246.8 lb

## 2013-07-24 DIAGNOSIS — I255 Ischemic cardiomyopathy: Secondary | ICD-10-CM

## 2013-07-24 DIAGNOSIS — Z789 Other specified health status: Secondary | ICD-10-CM

## 2013-07-24 DIAGNOSIS — Z952 Presence of prosthetic heart valve: Secondary | ICD-10-CM

## 2013-07-24 DIAGNOSIS — I4891 Unspecified atrial fibrillation: Secondary | ICD-10-CM

## 2013-07-24 DIAGNOSIS — Z9189 Other specified personal risk factors, not elsewhere classified: Secondary | ICD-10-CM

## 2013-07-24 DIAGNOSIS — I4892 Unspecified atrial flutter: Secondary | ICD-10-CM

## 2013-07-24 DIAGNOSIS — I2510 Atherosclerotic heart disease of native coronary artery without angina pectoris: Secondary | ICD-10-CM

## 2013-07-24 DIAGNOSIS — I2589 Other forms of chronic ischemic heart disease: Secondary | ICD-10-CM

## 2013-07-24 DIAGNOSIS — Z9889 Other specified postprocedural states: Secondary | ICD-10-CM

## 2013-07-24 DIAGNOSIS — Z951 Presence of aortocoronary bypass graft: Secondary | ICD-10-CM

## 2013-07-24 DIAGNOSIS — I447 Left bundle-branch block, unspecified: Secondary | ICD-10-CM

## 2013-07-24 DIAGNOSIS — Z954 Presence of other heart-valve replacement: Secondary | ICD-10-CM

## 2013-07-24 NOTE — Progress Notes (Signed)
OFFICE NOTE  Chief Complaint:  Routine followup  Primary Care Physician: Cassell Smiles., MD  HPI:  [name] a pleasant 67 year old gentleman with history of coronary artery disease status post CABG x3 vessels with Dr. Donata [name] with a LIMA to the LAD, SVG to circumflex and SVG to PDA. He also had an [facility] Ease bioprosthetic 23-mm valve placed at that time in 2010. He is seen today in followup from the hospital for TCM 7 due to the high risk of admission.  He did receive a phone call after discharge within 24 hours which found him well, continuing to urinate excessively with ongoing weight loss and improved dyspnea.  His history goes back to 2003, unfortunately,when he had a stroke which has caused much difficulty walking as well as some heaviness and some swelling in the right upper extremity particularly. Unfortunately, he has not been able to lose weight. He is active walking but does not necessarily do any exercise. In February 2013 he underwent another echocardiogram to look at his valve gradient. It was increased significantly compared to his prior study. The peak gradient was 26, mean gradient of 14. However, he was not complaining of any worsening dyspnea or anything associated with that. He is describing some symptoms which are concerning for worsening acid reflux including belching and increased heartburn. At his last office visit I increased his protonix to twice daily. This is improved his reflux some, but he still has symptoms associated with some belching and/or flatus which improves. Recently he was admitted to the hospital for atrial fibrillation with rapid ventricular response which was noted when he showed up at his doctor's office after having 1-2 loose stools but no bleeding, no abdominal pain. He did have a couple of episodes of dry heaves and 1-2 episodes of small volume non-bloody vomiting. He reported no significant change with his po intake however. Over  the last day his bowel habits have returned to baseline and he has not had any further nausea or vomiting. He saw his PCP's office 05/31/13 due to SOB and palpitations and after an ECG showed tachyarrhythmia they called 911 and an ambulance brought him to the [facility] ED. On arrival his ECG showed a wide-complex rhythm at 157 bpm and telemetry showed irregularly irregular rhythm. Historical ECG's show NSR with LBBB pattern thus the LBBB was not new. In the ED, physician staff concluded that he had new onset AF and started a Diltiazem infusion with some improvement in the HR but significant irregularity and variance in his HR continued. IV amiodarone was started and coreg changed to lopressor. ACE was held. Lasix given. He eventually changed to atrial flutter and then spontaneously converted to sinus rhythm. Pt hyperkalemic and kayexalate given. Plan for anticoagulation after rt and lt heart cath to eval for ischemic cause of atrial fib. EF by echo no 25-30%. He also has grade 3 diastolic dysfunction.   Pt continued to improve and underwent cardiac cath:   Impression:  1. Native 3 vessel CAD.  2. Patent LIMA to LAD, SVG to OM and SVG to PDA - TIMI III flow  3. Elevated right and left heart filling pressures, with predominantly pulmonary venous hypertension, normal PVR  4. Mildly reduced cardiac output and index   IV lasix added with more diuresis. Pt improved, could lie flat to sleep. Due to low EF and risk for sudden cardiac death, Life Vest was obtained. Pt ambulating without problems. By today no complaints. Unable  to resume ACE due to elevated Cr. We will address on office visit. Pt changed to po torsemide 10 mg. Diabetes coordinator talked with pt as well.   He returns now one month later and brings back with him paperwork which shows very stable weights. His blood sugars have been all over the board, probably due to dietary indiscretion. Despite that though his heart failure seems fairly stable.  He continues to wear the LifeVest is complaining of the everyday that he has to wear it.  PMHx:  Past Medical History  Diagnosis Date  . Hypertension   . Diabetes mellitus     type 2   . History of valve replacement 2010    [facility] Ease bioprosthetic 23mm  . Thyroid disease   . Stroke 2003    R-sided weakness & upper extremity swelling   . Coronary artery disease   . Dyslipidemia   . At risk for sudden cardiac death 06/15/13  . PAF (paroxysmal atrial fibrillation) 05/2013    converted with amiodarone to SR  . Chronic anticoagulation 05/2013    started  . S/P cardiac catheterization, Rt & Lt heart cath 06/05/2013 2013/06/15  . S/P CABG x 3 2010  . GERD (gastroesophageal reflux disease)     Past Surgical History  Procedure Laterality Date  . Coronary artery bypass graft  01/26/2009    LIMA to LAD, SVG to circumflex, SVG to PDA  . Aortic valve replacement  01/26/2009    [facility] Ease bioprosthetic 23mm valve  . Transthoracic echocardiogram  09/2011    grade 1 diastolic dysfunction; increasing valve gradient; calcified MV annulus   . Cardiac catheterization  06/05/2013    native 3 vessel disease; patent LIMA to LAD, SVG to OM and SVG to PDA; Elevated right and left heart filling pressures, with predominantly pulmonary venous hypertension, normal PVR  . Transthoracic echocardiogram  06/03/2013    EF 25-30%, mod conc. hypertrophy, grade 3 diastolic dysfunction; LA mod dilated; calcified MV annulus; transaortic gradients are normal for the bioprosthetic valve; inf vena cava dilated (elevated CVP) - LifeVest    FAMHx:  Family History  Problem Relation Age of Onset  . Heart attack Mother   . Liver disease Brother   . Diabetes Sister     x4    SOCHx:   reports that he quit smoking about 11 years ago. His smoking use included Cigarettes. He smoked 0.00 packs per day. He has quit using smokeless tobacco. His smokeless tobacco use included Chew. He reports that he does  not drink alcohol or use illicit drugs.  ALLERGIES:  No Known Allergies  ROS: A comprehensive review of systems was negative except for: Respiratory: positive for dyspnea on exertion Gastrointestinal: positive for dyspepsia and reflux symptoms  HOME MEDS: Current Outpatient Prescriptions  Medication Sig Dispense Refill  . acetaminophen (TYLENOL) 500 MG tablet Take 1,000 mg by mouth every 6 (six) hours as needed. For pain/headaches       . ALPRAZolam (XANAX) 1 MG tablet Take 1 mg by mouth 4 (four) times daily as needed for anxiety.       Marland Kitchen amiodarone (PACERONE) 200 MG tablet Take 200 mg by mouth daily.       Marland Kitchen apixaban (ELIQUIS) 5 MG TABS tablet Take 1 tablet (5 mg total) by mouth 2 (two) times daily.  60 tablet  6  . aspirin 81 MG EC tablet Take 81 mg by mouth daily.        Marland Kitchen atorvastatin (LIPITOR)  20 MG tablet Take 1 tablet (20 mg total) by mouth daily at 6 PM.  30 tablet  6  . bismuth subsalicylate (PEPTO BISMOL) 262 MG/15ML suspension Take 30 mLs by mouth every 6 (six) hours as needed for indigestion.      . docusate sodium (COLACE) 100 MG capsule Take 100 mg by mouth 2 (two) times daily.        . fenofibrate (TRICOR) 145 MG tablet Take 145 mg by mouth daily.      Marland Kitchen gabapentin (NEURONTIN) 300 MG capsule Take 300 mg by mouth 3 (three) times daily.        . insulin glargine (LANTUS) 100 UNIT/ML injection Inject 60-70 Units into the skin 2 (two) times daily. Takes70 units in the morning and 60 units in the evening      . insulin lispro (HUMALOG) 100 UNIT/ML injection Inject 35-45 Units into the skin 3 (three) times daily before meals. 45U bid and 35U QD      . levothyroxine (SYNTHROID, LEVOTHROID) 25 MCG tablet Take 25 mcg by mouth daily.        . metoprolol succinate (TOPROL XL) 25 MG 24 hr tablet Take 1 tablet (25 mg total) by mouth daily.  30 tablet  6  . pantoprazole (PROTONIX) 40 MG tablet Take 40 mg by mouth 2 (two) times daily.      Marland Kitchen torsemide (DEMADEX) 10 MG tablet Take 1 tablet  (10 mg total) by mouth daily.  30 tablet  6  . SIMBRINZA 1-0.2 % SUSP Place 1 drop into the left eye 2 (two) times daily.       No current facility-administered medications for this visit.    LABS/IMAGING: No results found for this or any previous visit (from the past 48 hour(s)). No results found.  VITALS: BP 158/70  Pulse 57  Ht 5' 10.5" (1.791 m)  Wt 246 lb 12.8 oz (111.948 kg)  BMI 34.90 kg/m2  EXAM: General appearance: alert and no distress, wearing lifevest Neck: no carotid bruit, no JVD Lungs: clear to auscultation bilaterally Heart: regular rate and rhythm, S1, S2 normal, 2/6 SEM at RUSB, no click, rub or gallop Abdomen: soft, non-tender; bowel sounds normal; no masses,  no organomegaly and obese Extremities: extremities normal, atraumatic, no cyanosis or edema Pulses: 2+ and symmetric Skin: Skin color, texture, turgor normal. No rashes or lesions Neurologic: Grossly normal  EKG: Normal sinus rhythm at 67, left bundle branch block, first degree AV block  ASSESSMENT: 1. Coronary artery disease status post three-vessel CABG in 2010 2. Status post bioprosthetic aVR in 2010, [facility] MagnaEase valve- 23 mm, gradients of 26/14 mmHg 3. GERD - on protoix 4. Dyslipidemia 5. Diabetes type 2 6. History of stroke with right-sided hemiparesis - complaining of numbness and tingling in his 4th/5th digits on the right hand 7. Atrial fibrillation with rapid ventricular response-spontaneously converted with amiodarone 8. Acute on chronic systolic congestive heart failure-newly reduced EF to 25-30%, and NYHA class II symptoms on a lifevest 9. Left bundle-branch block-QRS duration 158 msec  PLAN: 1.   [name] is doing much better after his hospitalization. His weight continues to be stable within a few pounds. He does have a significant left bundle-branch block with QRS duration greater than 150 ms. Plan is to recheck an echocardiogram in the end of January. If his EF remains less  than 35%, I think he would be a good candidate for an implanted defibrillator. Based on his wide left bundle-branch block, CRT ICD  therapy may be indicated.  Chrystie Nose, MD, [facility] Attending Cardiologist [facility]  Chrystal Zeimet C 07/24/2013, 2:55 PM

## 2013-07-24 NOTE — Patient Instructions (Signed)
Please schedule your echocardiogram.  Please schedule a follow up appointment after you echo, in about February.

## 2013-08-19 ENCOUNTER — Ambulatory Visit (HOSPITAL_COMMUNITY): Payer: Medicare Other

## 2013-09-05 ENCOUNTER — Telehealth: Payer: Self-pay | Admitting: *Deleted

## 2013-09-05 NOTE — Telephone Encounter (Signed)
Faxed [employer] LifeVest prescription renewal

## 2013-09-06 ENCOUNTER — Telehealth: Payer: Self-pay | Admitting: *Deleted

## 2013-09-06 NOTE — Telephone Encounter (Signed)
Faxed copy of signed orders for LifeVest to [place] at [place].

## 2013-09-09 ENCOUNTER — Ambulatory Visit (HOSPITAL_COMMUNITY)
Admission: RE | Admit: 2013-09-09 | Discharge: 2013-09-09 | Disposition: A | Discharge status: Home / Self Care | Payer: Medicare FFS | Source: Ambulatory Visit | Attending: Cardiovascular Disease | Admitting: Cardiovascular Disease

## 2013-09-09 DIAGNOSIS — Z954 Presence of other heart-valve replacement: Secondary | ICD-10-CM | POA: Insufficient documentation

## 2013-09-09 DIAGNOSIS — Z951 Presence of aortocoronary bypass graft: Secondary | ICD-10-CM | POA: Insufficient documentation

## 2013-09-09 DIAGNOSIS — I10 Essential (primary) hypertension: Secondary | ICD-10-CM | POA: Insufficient documentation

## 2013-09-09 DIAGNOSIS — I059 Rheumatic mitral valve disease, unspecified: Secondary | ICD-10-CM | POA: Insufficient documentation

## 2013-09-09 DIAGNOSIS — I428 Other cardiomyopathies: Secondary | ICD-10-CM | POA: Insufficient documentation

## 2013-09-09 DIAGNOSIS — E119 Type 2 diabetes mellitus without complications: Secondary | ICD-10-CM | POA: Insufficient documentation

## 2013-09-09 DIAGNOSIS — I2589 Other forms of chronic ischemic heart disease: Secondary | ICD-10-CM

## 2013-09-09 DIAGNOSIS — I517 Cardiomegaly: Secondary | ICD-10-CM | POA: Insufficient documentation

## 2013-09-09 DIAGNOSIS — I2510 Atherosclerotic heart disease of native coronary artery without angina pectoris: Secondary | ICD-10-CM

## 2013-09-09 NOTE — Progress Notes (Signed)
2D Echo Performed 09/09/2013    Elbia Paro, RCS  

## 2013-09-11 NOTE — Progress Notes (Signed)
LM with sister Enid Derry to have patient call office for test results

## 2013-09-17 ENCOUNTER — Ambulatory Visit (INDEPENDENT_AMBULATORY_CARE_PROVIDER_SITE_OTHER): Payer: Medicare HMO | Admitting: Internal Medicine

## 2013-09-17 ENCOUNTER — Encounter: Payer: Self-pay | Admitting: Internal Medicine

## 2013-09-17 VITALS — BP 120/70 | HR 67 | Ht 70.5 in | Wt 249.6 lb

## 2013-09-17 DIAGNOSIS — I4891 Unspecified atrial fibrillation: Secondary | ICD-10-CM

## 2013-09-17 DIAGNOSIS — I5043 Acute on chronic combined systolic (congestive) and diastolic (congestive) heart failure: Secondary | ICD-10-CM

## 2013-09-17 DIAGNOSIS — I447 Left bundle-branch block, unspecified: Secondary | ICD-10-CM

## 2013-09-17 DIAGNOSIS — Z954 Presence of other heart-valve replacement: Secondary | ICD-10-CM

## 2013-09-17 DIAGNOSIS — Z952 Presence of prosthetic heart valve: Secondary | ICD-10-CM

## 2013-09-17 DIAGNOSIS — E119 Type 2 diabetes mellitus without complications: Secondary | ICD-10-CM

## 2013-09-17 DIAGNOSIS — I2510 Atherosclerotic heart disease of native coronary artery without angina pectoris: Secondary | ICD-10-CM

## 2013-09-17 DIAGNOSIS — Z951 Presence of aortocoronary bypass graft: Secondary | ICD-10-CM

## 2013-09-17 NOTE — Patient Instructions (Signed)
You may stop  The life vest --send back to [place] wants you to follow-up in 6 month  Dr Debara Pickett . You will receive a reminder letter in the mail two months in advance. If you don't receive a letter, please call our office to schedule the follow-up appointment.

## 2013-09-17 NOTE — Progress Notes (Signed)
OFFICE NOTE  Chief Complaint:  Routine followup  Primary Care Physician: [name]., MD  HPI:  [name] a pleasant 68 year old gentleman with history of coronary artery disease status post CABG x3 vessels with Dr. Prescott Gum with a LIMA to the LAD, SVG to circumflex and SVG to PDA. He also had an [facility] Ease bioprosthetic 23-mm valve placed at that time in 2010. He is seen today in followup from the hospital for TCM 7 due to the high risk of admission.  He did receive a phone call after discharge within 24 hours which found him well, continuing to urinate excessively with ongoing weight loss and improved dyspnea.  His history goes back to 2003, unfortunately,when he had a stroke which has caused much difficulty walking as well as some heaviness and some swelling in the right upper extremity particularly. Unfortunately, he has not been able to lose weight. He is active walking but does not necessarily do any exercise. In February 2013 he underwent another echocardiogram to look at his valve gradient. It was increased significantly compared to his prior study. The peak gradient was 26, mean gradient of 14. However, he was not complaining of any worsening dyspnea or anything associated with that. He is describing some symptoms which are concerning for worsening acid reflux including belching and increased heartburn. At his last office visit I increased his protonix to twice daily. This is improved his reflux some, but he still has symptoms associated with some belching and/or flatus which improves. Recently he was admitted to the hospital for atrial fibrillation with rapid ventricular response which was noted when he showed up at his doctor's office after having 1-2 loose stools but no bleeding, no abdominal pain. He did have a couple of episodes of dry heaves and 1-2 episodes of small volume non-bloody vomiting. He reported no significant change with his po intake however. Over  the last day his bowel habits have returned to baseline and he has not had any further nausea or vomiting. He saw his PCP's office 05/31/13 due to SOB and palpitations and after an ECG showed tachyarrhythmia they called 911 and an ambulance brought him to the [facility] ED. On arrival his ECG showed a wide-complex rhythm at 157 bpm and telemetry showed irregularly irregular rhythm. Historical ECG's show NSR with LBBB pattern thus the LBBB was not new. In the ED, physician staff concluded that he had new onset AF and started a Diltiazem infusion with some improvement in the HR but significant irregularity and variance in his HR continued. IV amiodarone was started and coreg changed to lopressor. ACE was held. Lasix given. He eventually changed to atrial flutter and then spontaneously converted to sinus rhythm. Pt hyperkalemic and kayexalate given. Plan for anticoagulation after rt and lt heart cath to eval for ischemic cause of atrial fib. EF by echo no 25-30%. He also has grade 3 diastolic dysfunction.   Pt continued to improve and underwent cardiac cath:   Impression:  1. Native 3 vessel CAD.  2. Patent LIMA to LAD, SVG to OM and SVG to PDA - TIMI III flow  3. Elevated right and left heart filling pressures, with predominantly pulmonary venous hypertension, normal PVR  4. Mildly reduced cardiac output and index   IV lasix added with more diuresis. Pt improved, could lie flat to sleep. Due to low EF and risk for sudden cardiac death, Life Vest was obtained. Pt ambulating without problems. By today no complaints.  Unable to resume ACE due to elevated Cr. We will address on office visit. Pt changed to po torsemide 10 mg. Diabetes coordinator talked with pt as well.   He returns feeling well today. He has had stable weights at home. He continues to wear the LifeVest is complaining of the everyday that he has to wear it.  Fortunately had a recent echo on 09/09/2013 which shows an improved EF of 45-50%, which  was previously 30%.  PMHx:  Past Medical History  Diagnosis Date  . Hypertension   . Diabetes mellitus     type 2   . History of valve replacement 2010    [facility] Ease bioprosthetic 38mm  . Thyroid disease   . Stroke 2003    R-sided weakness & upper extremity swelling   . Coronary artery disease   . Dyslipidemia   . At risk for sudden cardiac death 2013/06/28  . PAF (paroxysmal atrial fibrillation) 05/2013    converted with amiodarone to SR  . Chronic anticoagulation 05/2013    started  . S/P cardiac catheterization, Rt & Lt heart cath 06/05/2013 28-Jun-2013  . S/P CABG x 3 2010  . GERD (gastroesophageal reflux disease)     Past Surgical History  Procedure Laterality Date  . Coronary artery bypass graft  01/26/2009    LIMA to LAD, SVG to circumflex, SVG to PDA  . Aortic valve replacement  01/26/2009    [facility] Ease bioprosthetic 63mm valve  . Transthoracic echocardiogram  09/2011    grade 1 diastolic dysfunction; increasing valve gradient; calcified MV annulus   . Cardiac catheterization  06/05/2013    native 3 vessel disease; patent LIMA to LAD, SVG to OM and SVG to PDA; Elevated right and left heart filling pressures, with predominantly pulmonary venous hypertension, normal PVR  . Transthoracic echocardiogram  06/03/2013    EF 25-30%, mod conc. hypertrophy, grade 3 diastolic dysfunction; LA mod dilated; calcified MV annulus; transaortic gradients are normal for the bioprosthetic valve; inf vena cava dilated (elevated CVP) - LifeVest    FAMHx:  Family History  Problem Relation Age of Onset  . Heart attack Mother   . Liver disease Brother   . Diabetes Sister     x4    SOCHx:   reports that he quit smoking about 12 years ago. His smoking use included Cigarettes. He smoked 0.00 packs per day. He has quit using smokeless tobacco. His smokeless tobacco use included Chew. He reports that he does not drink alcohol or use illicit drugs.  ALLERGIES:  No Known  Allergies  ROS: A comprehensive review of systems was negative except for: Respiratory: positive for dyspnea on exertion Gastrointestinal: positive for dyspepsia and reflux symptoms  HOME MEDS: Current Outpatient Prescriptions  Medication Sig Dispense Refill  . acetaminophen (TYLENOL) 500 MG tablet Take 1,000 mg by mouth every 6 (six) hours as needed. For pain/headaches       . ALPRAZolam (XANAX) 1 MG tablet Take 1 mg by mouth 4 (four) times daily as needed for anxiety.       Marland Kitchen amiodarone (PACERONE) 200 MG tablet Take 200 mg by mouth daily.       Marland Kitchen apixaban (ELIQUIS) 5 MG TABS tablet Take 1 tablet (5 mg total) by mouth 2 (two) times daily.  60 tablet  6  . aspirin 81 MG EC tablet Take 81 mg by mouth daily.        Marland Kitchen atorvastatin (LIPITOR) 20 MG tablet Take 1 tablet (20 mg  total) by mouth daily at 6 PM.  30 tablet  6  . bismuth subsalicylate (PEPTO BISMOL) 262 MG/15ML suspension Take 30 mLs by mouth every 6 (six) hours as needed for indigestion.      . docusate sodium (COLACE) 100 MG capsule Take 100 mg by mouth 2 (two) times daily.        . fenofibrate (TRICOR) 145 MG tablet Take 145 mg by mouth daily.      Marland Kitchen gabapentin (NEURONTIN) 300 MG capsule Take 300 mg by mouth 3 (three) times daily.        . insulin glargine (LANTUS) 100 UNIT/ML injection Inject 60-70 Units into the skin 2 (two) times daily. Takes70 units in the morning and 60 units in the evening      . insulin lispro (HUMALOG) 100 UNIT/ML injection Inject 35-45 Units into the skin 3 (three) times daily before meals. 45U bid and 35U QD      . levothyroxine (SYNTHROID, LEVOTHROID) 25 MCG tablet Take 25 mcg by mouth daily.        . metoprolol succinate (TOPROL XL) 25 MG 24 hr tablet Take 1 tablet (25 mg total) by mouth daily.  30 tablet  6  . pantoprazole (PROTONIX) 40 MG tablet Take 40 mg by mouth 2 (two) times daily.      Marland Kitchen SIMBRINZA 1-0.2 % SUSP Place 1 drop into the left eye 2 (two) times daily.      Marland Kitchen torsemide (DEMADEX) 10 MG  tablet Take 1 tablet (10 mg total) by mouth daily.  30 tablet  6   No current facility-administered medications for this visit.    LABS/IMAGING: No results found for this or any previous visit (from the past 48 hour(s)). No results found.  VITALS: BP 120/70  Pulse 67  Ht 5' 10.5" (1.791 m)  Wt 249 lb 9.6 oz (113.218 kg)  BMI 35.30 kg/m2  EXAM: General appearance: alert and no distress, wearing lifevest Neck: no carotid bruit, no JVD Lungs: clear to auscultation bilaterally Heart: regular rate and rhythm, S1, S2 normal, 2/6 SEM at RUSB, no click, rub or gallop Abdomen: soft, non-tender; bowel sounds normal; no masses,  no organomegaly and obese Extremities: extremities normal, atraumatic, no cyanosis or edema Pulses: 2+ and symmetric Skin: Skin color, texture, turgor normal. No rashes or lesions Neurologic: Grossly normal  EKG: Normal sinus rhythm at 67, left bundle branch block, first degree AV block  ASSESSMENT: 1. Coronary artery disease status post three-vessel CABG in 2010 2. Status post bioprosthetic aVR in 2010, [facility] MagnaEase valve- 23 mm, gradients of 26/14 mmHg 3. GERD - on protoix 4. Dyslipidemia 5. Diabetes type 2 6. History of stroke with right-sided hemiparesis - complaining of numbness and tingling in his 4th/5th digits on the right hand 7. Atrial fibrillation with rapid ventricular response-spontaneously converted with amiodarone 8. Acute on chronic systolic congestive heart failure-EF now 45-50%, NYHA Class II symptoms 9. Left bundle-branch block-QRS duration 158 msec  PLAN: 1.   Mr. Auletta is doing much better.  His ejection fraction has come back up to 45-50%, therefore he no longer needs to be considered for an AICD. I believe we can discontinue his lifevest at this time and I recommended he contact the company to return it. He should continue on his current medications for heart failure as his weight has remained fairly stable on this regimen. I plan  to see him back in 6 months or sooner as necessary.  Pixie Casino, MD, [facility] Attending  Cardiologist [facility] C 09/17/2013, 1:06 PM

## 2013-11-01 ENCOUNTER — Other Ambulatory Visit (HOSPITAL_COMMUNITY): Payer: Self-pay | Admitting: Endocrinology

## 2013-11-01 DIAGNOSIS — E049 Nontoxic goiter, unspecified: Secondary | ICD-10-CM

## 2013-11-25 ENCOUNTER — Encounter (HOSPITAL_COMMUNITY): Payer: Self-pay | Admitting: Emergency Medicine

## 2013-11-25 ENCOUNTER — Emergency Department (HOSPITAL_COMMUNITY): Payer: Medicare HMO

## 2013-11-25 ENCOUNTER — Inpatient Hospital Stay (HOSPITAL_COMMUNITY)
Admission: EM | Admit: 2013-11-25 | Discharge: 2013-11-28 | DRG: 291 | Disposition: A | Discharge status: Home Health | Payer: Medicare HMO | Attending: Internal Medicine | Admitting: Internal Medicine

## 2013-11-25 DIAGNOSIS — I255 Ischemic cardiomyopathy: Secondary | ICD-10-CM

## 2013-11-25 DIAGNOSIS — N183 Chronic kidney disease, stage 3 unspecified: Secondary | ICD-10-CM | POA: Diagnosis present

## 2013-11-25 DIAGNOSIS — E119 Type 2 diabetes mellitus without complications: Secondary | ICD-10-CM

## 2013-11-25 DIAGNOSIS — I129 Hypertensive chronic kidney disease with stage 1 through stage 4 chronic kidney disease, or unspecified chronic kidney disease: Secondary | ICD-10-CM | POA: Diagnosis present

## 2013-11-25 DIAGNOSIS — H269 Unspecified cataract: Secondary | ICD-10-CM

## 2013-11-25 DIAGNOSIS — E1165 Type 2 diabetes mellitus with hyperglycemia: Secondary | ICD-10-CM

## 2013-11-25 DIAGNOSIS — Z79899 Other long term (current) drug therapy: Secondary | ICD-10-CM

## 2013-11-25 DIAGNOSIS — J189 Pneumonia, unspecified organism: Secondary | ICD-10-CM

## 2013-11-25 DIAGNOSIS — Z951 Presence of aortocoronary bypass graft: Secondary | ICD-10-CM

## 2013-11-25 DIAGNOSIS — I4891 Unspecified atrial fibrillation: Secondary | ICD-10-CM | POA: Diagnosis present

## 2013-11-25 DIAGNOSIS — I447 Left bundle-branch block, unspecified: Secondary | ICD-10-CM | POA: Diagnosis present

## 2013-11-25 DIAGNOSIS — I4892 Unspecified atrial flutter: Secondary | ICD-10-CM

## 2013-11-25 DIAGNOSIS — M75100 Unspecified rotator cuff tear or rupture of unspecified shoulder, not specified as traumatic: Secondary | ICD-10-CM

## 2013-11-25 DIAGNOSIS — E785 Hyperlipidemia, unspecified: Secondary | ICD-10-CM | POA: Diagnosis present

## 2013-11-25 DIAGNOSIS — IMO0001 Reserved for inherently not codable concepts without codable children: Secondary | ICD-10-CM

## 2013-11-25 DIAGNOSIS — Z8249 Family history of ischemic heart disease and other diseases of the circulatory system: Secondary | ICD-10-CM

## 2013-11-25 DIAGNOSIS — Z7901 Long term (current) use of anticoagulants: Secondary | ICD-10-CM

## 2013-11-25 DIAGNOSIS — Z8673 Personal history of transient ischemic attack (TIA), and cerebral infarction without residual deficits: Secondary | ICD-10-CM

## 2013-11-25 DIAGNOSIS — Z87891 Personal history of nicotine dependence: Secondary | ICD-10-CM

## 2013-11-25 DIAGNOSIS — IMO0002 Reserved for concepts with insufficient information to code with codable children: Secondary | ICD-10-CM

## 2013-11-25 DIAGNOSIS — N179 Acute kidney failure, unspecified: Secondary | ICD-10-CM

## 2013-11-25 DIAGNOSIS — Z794 Long term (current) use of insulin: Secondary | ICD-10-CM

## 2013-11-25 DIAGNOSIS — I5043 Acute on chronic combined systolic (congestive) and diastolic (congestive) heart failure: Principal | ICD-10-CM | POA: Diagnosis present

## 2013-11-25 DIAGNOSIS — Z7982 Long term (current) use of aspirin: Secondary | ICD-10-CM

## 2013-11-25 DIAGNOSIS — E875 Hyperkalemia: Secondary | ICD-10-CM

## 2013-11-25 DIAGNOSIS — J209 Acute bronchitis, unspecified: Secondary | ICD-10-CM | POA: Diagnosis present

## 2013-11-25 DIAGNOSIS — I959 Hypotension, unspecified: Secondary | ICD-10-CM

## 2013-11-25 DIAGNOSIS — I2589 Other forms of chronic ischemic heart disease: Secondary | ICD-10-CM | POA: Diagnosis present

## 2013-11-25 DIAGNOSIS — G8191 Hemiplegia, unspecified affecting right dominant side: Secondary | ICD-10-CM

## 2013-11-25 DIAGNOSIS — Z954 Presence of other heart-valve replacement: Secondary | ICD-10-CM

## 2013-11-25 DIAGNOSIS — Z9889 Other specified postprocedural states: Secondary | ICD-10-CM

## 2013-11-25 DIAGNOSIS — R112 Nausea with vomiting, unspecified: Secondary | ICD-10-CM | POA: Diagnosis present

## 2013-11-25 DIAGNOSIS — J9600 Acute respiratory failure, unspecified whether with hypoxia or hypercapnia: Secondary | ICD-10-CM | POA: Diagnosis present

## 2013-11-25 DIAGNOSIS — E669 Obesity, unspecified: Secondary | ICD-10-CM

## 2013-11-25 DIAGNOSIS — Z9189 Other specified personal risk factors, not elsewhere classified: Secondary | ICD-10-CM

## 2013-11-25 DIAGNOSIS — K219 Gastro-esophageal reflux disease without esophagitis: Secondary | ICD-10-CM | POA: Diagnosis present

## 2013-11-25 DIAGNOSIS — I639 Cerebral infarction, unspecified: Secondary | ICD-10-CM

## 2013-11-25 DIAGNOSIS — I2510 Atherosclerotic heart disease of native coronary artery without angina pectoris: Secondary | ICD-10-CM | POA: Diagnosis present

## 2013-11-25 DIAGNOSIS — I5033 Acute on chronic diastolic (congestive) heart failure: Secondary | ICD-10-CM

## 2013-11-25 DIAGNOSIS — J9601 Acute respiratory failure with hypoxia: Secondary | ICD-10-CM

## 2013-11-25 DIAGNOSIS — I509 Heart failure, unspecified: Secondary | ICD-10-CM | POA: Diagnosis present

## 2013-11-25 DIAGNOSIS — Z952 Presence of prosthetic heart valve: Secondary | ICD-10-CM

## 2013-11-25 DIAGNOSIS — Z833 Family history of diabetes mellitus: Secondary | ICD-10-CM

## 2013-11-25 LAB — URINALYSIS, ROUTINE W REFLEX MICROSCOPIC
Bilirubin Urine: NEGATIVE
Glucose, UA: NEGATIVE mg/dL
Hgb urine dipstick: NEGATIVE
Ketones, ur: NEGATIVE mg/dL
Leukocytes, UA: NEGATIVE
NITRITE: NEGATIVE
Protein, ur: NEGATIVE mg/dL
SPECIFIC GRAVITY, URINE: 1.02 (ref 1.005–1.030)
Urobilinogen, UA: 0.2 mg/dL (ref 0.0–1.0)
pH: 5 (ref 5.0–8.0)

## 2013-11-25 LAB — PRO B NATRIURETIC PEPTIDE: PRO B NATRI PEPTIDE: 1143 pg/mL — AB (ref 0–125)

## 2013-11-25 LAB — CBC WITH DIFFERENTIAL/PLATELET
Basophils Absolute: 0 10*3/uL (ref 0.0–0.1)
Basophils Relative: 0 % (ref 0–1)
EOS PCT: 0 % (ref 0–5)
Eosinophils Absolute: 0 10*3/uL (ref 0.0–0.7)
HEMATOCRIT: 33.5 % — AB (ref 39.0–52.0)
Hemoglobin: 10.9 g/dL — ABNORMAL LOW (ref 13.0–17.0)
Lymphocytes Relative: 11 % — ABNORMAL LOW (ref 12–46)
Lymphs Abs: 1.6 10*3/uL (ref 0.7–4.0)
MCH: 25.8 pg — ABNORMAL LOW (ref 26.0–34.0)
MCHC: 32.5 g/dL (ref 30.0–36.0)
MCV: 79.2 fL (ref 78.0–100.0)
MONOS PCT: 13 % — AB (ref 3–12)
Monocytes Absolute: 1.9 10*3/uL — ABNORMAL HIGH (ref 0.1–1.0)
NEUTROS ABS: 11.1 10*3/uL — AB (ref 1.7–7.7)
Neutrophils Relative %: 76 % (ref 43–77)
Platelets: 196 10*3/uL (ref 150–400)
RBC: 4.23 MIL/uL (ref 4.22–5.81)
RDW: 14.4 % (ref 11.5–15.5)
WBC Morphology: INCREASED
WBC: 14.6 10*3/uL — AB (ref 4.0–10.5)

## 2013-11-25 LAB — BASIC METABOLIC PANEL
BUN: 17 mg/dL (ref 6–23)
CO2: 27 meq/L (ref 19–32)
Calcium: 9.2 mg/dL (ref 8.4–10.5)
Chloride: 101 mEq/L (ref 96–112)
Creatinine, Ser: 1.5 mg/dL — ABNORMAL HIGH (ref 0.50–1.35)
GFR calc Af Amer: 55 mL/min — ABNORMAL LOW (ref >90)
GFR, EST NON AFRICAN AMERICAN: 47 mL/min — AB (ref >90)
GLUCOSE: 90 mg/dL (ref 70–99)
POTASSIUM: 4.3 meq/L (ref 3.7–5.3)
SODIUM: 141 meq/L (ref 137–147)

## 2013-11-25 LAB — CBG MONITORING, ED: Glucose-Capillary: 154 mg/dL — ABNORMAL HIGH (ref 70–99)

## 2013-11-25 LAB — TROPONIN I

## 2013-11-25 LAB — GLUCOSE, CAPILLARY: GLUCOSE-CAPILLARY: 184 mg/dL — AB (ref 70–99)

## 2013-11-25 LAB — MRSA PCR SCREENING: MRSA BY PCR: NEGATIVE

## 2013-11-25 LAB — LACTIC ACID, PLASMA: LACTIC ACID, VENOUS: 2.4 mmol/L — AB (ref 0.5–2.2)

## 2013-11-25 MED ORDER — INSULIN GLARGINE 100 UNIT/ML ~~LOC~~ SOLN
70.0000 [IU] | Freq: Every day | SUBCUTANEOUS | Status: DC
  Administered 2013-11-26 – 2013-11-28 (×3): 70 [IU] via SUBCUTANEOUS
  Filled 2013-11-25 (×5): qty 0.7

## 2013-11-25 MED ORDER — METOPROLOL SUCCINATE ER 25 MG PO TB24
25.0000 mg | ORAL_TABLET | Freq: Every day | ORAL | Status: DC
  Administered 2013-11-26 – 2013-11-28 (×3): 25 mg via ORAL
  Filled 2013-11-25 (×3): qty 1

## 2013-11-25 MED ORDER — ALBUTEROL SULFATE (2.5 MG/3ML) 0.083% IN NEBU
2.5000 mg | INHALATION_SOLUTION | RESPIRATORY_TRACT | Status: DC | PRN
  Administered 2013-11-25: 2.5 mg via RESPIRATORY_TRACT
  Filled 2013-11-25: qty 3

## 2013-11-25 MED ORDER — INSULIN ASPART 100 UNIT/ML ~~LOC~~ SOLN: 0.0000 [IU] | Freq: Three times a day (TID) | SUBCUTANEOUS | Status: DC

## 2013-11-25 MED ORDER — APIXABAN 5 MG PO TABS
5.0000 mg | ORAL_TABLET | Freq: Two times a day (BID) | ORAL | Status: DC
  Administered 2013-11-25 – 2013-11-28 (×6): 5 mg via ORAL
  Filled 2013-11-25 (×6): qty 1

## 2013-11-25 MED ORDER — METHYLPREDNISOLONE SODIUM SUCC 125 MG IJ SOLR
60.0000 mg | Freq: Four times a day (QID) | INTRAMUSCULAR | Status: DC
  Administered 2013-11-25 – 2013-11-26 (×2): 60 mg via INTRAVENOUS
  Filled 2013-11-25 (×2): qty 2

## 2013-11-25 MED ORDER — FUROSEMIDE 10 MG/ML IJ SOLN: 20.0000 mg | Freq: Two times a day (BID) | INTRAMUSCULAR | Status: DC

## 2013-11-25 MED ORDER — ATORVASTATIN CALCIUM 20 MG PO TABS
20.0000 mg | ORAL_TABLET | Freq: Every day | ORAL | Status: DC
  Administered 2013-11-25 – 2013-11-27 (×3): 20 mg via ORAL
  Filled 2013-11-25 (×3): qty 1

## 2013-11-25 MED ORDER — FUROSEMIDE 10 MG/ML IJ SOLN
INTRAMUSCULAR | Status: AC
  Filled 2013-11-25: qty 8

## 2013-11-25 MED ORDER — AMIODARONE HCL 200 MG PO TABS
200.0000 mg | ORAL_TABLET | Freq: Every day | ORAL | Status: DC
  Administered 2013-11-26 – 2013-11-28 (×3): 200 mg via ORAL
  Filled 2013-11-25 (×3): qty 1

## 2013-11-25 MED ORDER — SODIUM CHLORIDE 0.9 % IJ SOLN: 3.0000 mL | INTRAMUSCULAR | Status: DC | PRN

## 2013-11-25 MED ORDER — AZITHROMYCIN 500 MG IV SOLR
500.0000 mg | INTRAVENOUS | Status: DC
  Administered 2013-11-26 – 2013-11-27 (×2): 500 mg via INTRAVENOUS
  Filled 2013-11-25 (×3): qty 500

## 2013-11-25 MED ORDER — SODIUM CHLORIDE 0.9 % IV SOLN: 250.0000 mL | INTRAVENOUS | Status: DC | PRN

## 2013-11-25 MED ORDER — ALPRAZOLAM 1 MG PO TABS
1.0000 mg | ORAL_TABLET | Freq: Four times a day (QID) | ORAL | Status: DC | PRN
  Administered 2013-11-25 – 2013-11-28 (×2): 1 mg via ORAL
  Filled 2013-11-25: qty 1
  Filled 2013-11-25: qty 2

## 2013-11-25 MED ORDER — ASPIRIN EC 81 MG PO TBEC
81.0000 mg | DELAYED_RELEASE_TABLET | Freq: Every day | ORAL | Status: DC
  Administered 2013-11-25 – 2013-11-28 (×4): 81 mg via ORAL
  Filled 2013-11-25 (×4): qty 1

## 2013-11-25 MED ORDER — DEXTROSE 5 % IV SOLN
1.0000 g | Freq: Once | INTRAVENOUS | Status: AC
  Administered 2013-11-25: 1 g via INTRAVENOUS
  Filled 2013-11-25: qty 10

## 2013-11-25 MED ORDER — FUROSEMIDE 10 MG/ML IJ SOLN
80.0000 mg | Freq: Once | INTRAMUSCULAR | Status: AC
  Administered 2013-11-25: 80 mg via INTRAVENOUS

## 2013-11-25 MED ORDER — GABAPENTIN 300 MG PO CAPS
300.0000 mg | ORAL_CAPSULE | Freq: Three times a day (TID) | ORAL | Status: DC
  Administered 2013-11-25 – 2013-11-28 (×8): 300 mg via ORAL
  Filled 2013-11-25 (×8): qty 1

## 2013-11-25 MED ORDER — ONDANSETRON HCL 4 MG/2ML IJ SOLN: 4.0000 mg | Freq: Four times a day (QID) | INTRAMUSCULAR | Status: DC | PRN

## 2013-11-25 MED ORDER — DOCUSATE SODIUM 100 MG PO CAPS
100.0000 mg | ORAL_CAPSULE | Freq: Two times a day (BID) | ORAL | Status: DC
  Administered 2013-11-25 – 2013-11-28 (×6): 100 mg via ORAL
  Filled 2013-11-25 (×6): qty 1

## 2013-11-25 MED ORDER — INSULIN GLARGINE 100 UNIT/ML ~~LOC~~ SOLN
60.0000 [IU] | Freq: Every day | SUBCUTANEOUS | Status: DC
  Administered 2013-11-25 – 2013-11-27 (×3): 60 [IU] via SUBCUTANEOUS
  Filled 2013-11-25 (×4): qty 0.6

## 2013-11-25 MED ORDER — CEFTRIAXONE SODIUM 1 G IJ SOLR
1.0000 g | INTRAMUSCULAR | Status: DC
  Administered 2013-11-26 – 2013-11-27 (×2): 1 g via INTRAVENOUS
  Filled 2013-11-25 (×3): qty 10

## 2013-11-25 MED ORDER — ACETAMINOPHEN 500 MG PO TABS
1000.0000 mg | ORAL_TABLET | Freq: Four times a day (QID) | ORAL | Status: DC | PRN
  Administered 2013-11-25: 1000 mg via ORAL
  Filled 2013-11-25: qty 2

## 2013-11-25 MED ORDER — LEVOTHYROXINE SODIUM 25 MCG PO TABS
25.0000 ug | ORAL_TABLET | Freq: Every day | ORAL | Status: DC
  Administered 2013-11-26 – 2013-11-28 (×3): 25 ug via ORAL
  Filled 2013-11-25 (×3): qty 1

## 2013-11-25 MED ORDER — ONDANSETRON HCL 4 MG PO TABS
4.0000 mg | ORAL_TABLET | Freq: Four times a day (QID) | ORAL | Status: DC | PRN
  Administered 2013-11-25: 4 mg via ORAL
  Filled 2013-11-25: qty 1

## 2013-11-25 MED ORDER — SODIUM CHLORIDE 0.9 % IJ SOLN
3.0000 mL | Freq: Two times a day (BID) | INTRAMUSCULAR | Status: DC
  Administered 2013-11-25: 3 mL via INTRAVENOUS
  Administered 2013-11-26: 11:00:00 via INTRAVENOUS
  Administered 2013-11-27 – 2013-11-28 (×2): 3 mL via INTRAVENOUS

## 2013-11-25 MED ORDER — FENOFIBRATE 160 MG PO TABS
160.0000 mg | ORAL_TABLET | Freq: Every day | ORAL | Status: DC
  Administered 2013-11-26 – 2013-11-28 (×3): 160 mg via ORAL
  Filled 2013-11-25 (×4): qty 1

## 2013-11-25 MED ORDER — PANTOPRAZOLE SODIUM 40 MG PO TBEC
40.0000 mg | DELAYED_RELEASE_TABLET | Freq: Two times a day (BID) | ORAL | Status: DC
  Administered 2013-11-25 – 2013-11-28 (×6): 40 mg via ORAL
  Filled 2013-11-25 (×6): qty 1

## 2013-11-25 MED ORDER — DEXTROSE 5 % IV SOLN
500.0000 mg | Freq: Once | INTRAVENOUS | Status: AC
  Administered 2013-11-25: 500 mg via INTRAVENOUS

## 2013-11-25 NOTE — ED Provider Notes (Signed)
CSN: [id number]     Arrival date & time 11/25/13  1418 History  This chart was scribed for Tanna Furry, MD by Zettie Pho, ED Scribe. This patient was seen in room APA09/APA09 and the patient's care was started at 4:03 PM.    Chief Complaint  Patient presents with  . Shortness of Breath   The history is provided by the patient. No language interpreter was used.   HPI Comments: [name] is a 68 y.o. male with a history of coronary artery disease, stroke, risk for sudden cardiac death, paroxysmal atrial fibrillation who presents to the Emergency Department complaining of shortness of breath with associated productive cough onset yesterday that he states has been progressively worsening. Patient reports associated nausea with multiple episodes of non-bilious, non-bloody emesis, chills, and subjective fever (patient is afebrile at 97.5 in the ED) onset last night. Patient reports some associated, diffuse weakness that he states caused him to fall yesterday and that it took him about an hour to get back up after the fall. He denies sore throat, chest pain. Patient has a surgical history of CABG, aortic valve replacement, cardiac catheterization, and transthoracic echocardiogram x2. Patient also has a history of HTN, dyslipidemia, DM, thyroid disease, chronic anticoagulation, and GERD. Patient is a former smoker (for 40 years) and quit in 2003.   Past Medical History  Diagnosis Date  . Hypertension   . Diabetes mellitus     type 2   . History of valve replacement 2010    [facility] Ease bioprosthetic 40mm  . Thyroid disease   . Stroke 2003    R-sided weakness & upper extremity swelling   . Coronary artery disease   . Dyslipidemia   . At risk for sudden cardiac death Jun 14, 2013  . PAF (paroxysmal atrial fibrillation) 05/2013    converted with amiodarone to SR  . Chronic anticoagulation 05/2013    started  . S/P cardiac catheterization, Rt & Lt heart cath 06/05/2013 2013-06-14  . S/P  CABG x 3 2010  . GERD (gastroesophageal reflux disease)    Past Surgical History  Procedure Laterality Date  . Coronary artery bypass graft  01/26/2009    LIMA to LAD, SVG to circumflex, SVG to PDA  . Aortic valve replacement  01/26/2009    [facility] Ease bioprosthetic 22mm valve  . Transthoracic echocardiogram  09/2011    grade 1 diastolic dysfunction; increasing valve gradient; calcified MV annulus   . Cardiac catheterization  06/05/2013    native 3 vessel disease; patent LIMA to LAD, SVG to OM and SVG to PDA; Elevated right and left heart filling pressures, with predominantly pulmonary venous hypertension, normal PVR  . Transthoracic echocardiogram  06/03/2013    EF 25-30%, mod conc. hypertrophy, grade 3 diastolic dysfunction; LA mod dilated; calcified MV annulus; transaortic gradients are normal for the bioprosthetic valve; inf vena cava dilated (elevated CVP) - LifeVest   Family History  Problem Relation Age of Onset  . Heart attack Mother   . Liver disease Brother   . Diabetes Sister     x4   History  Substance Use Topics  . Smoking status: Former Smoker    Types: Cigarettes    Quit date: 08/15/2001  . Smokeless tobacco: Former Systems developer    Types: Chew  . Alcohol Use: No    Review of Systems  Constitutional: Positive for fever (subjective, resolved) and chills. Negative for diaphoresis, appetite change and fatigue.  HENT: Negative for mouth sores, sore throat and  trouble swallowing.   Eyes: Negative for visual disturbance.  Respiratory: Positive for cough and shortness of breath. Negative for chest tightness and wheezing.   Cardiovascular: Negative for chest pain.  Gastrointestinal: Positive for nausea and vomiting. Negative for abdominal pain, diarrhea and abdominal distention.  Endocrine: Negative for polydipsia, polyphagia and polyuria.  Genitourinary: Negative for dysuria, frequency and hematuria.  Musculoskeletal: Negative for gait problem.  Skin: Negative for color  change, pallor and rash.  Neurological: Positive for weakness (diffuse, resolved). Negative for dizziness, syncope, light-headedness and headaches.  Hematological: Does not bruise/bleed easily.  Psychiatric/Behavioral: Negative for behavioral problems and confusion.      Allergies  Review of patient's allergies indicates no known allergies.  Home Medications   No current outpatient prescriptions on file. Triage Vitals: BP 108/40  Pulse 84  Temp(Src) 97.5 F (36.4 C) (Oral)  Resp 18  Ht 5' 7.5" (1.715 m)  Wt 241 lb (109.317 kg)  BMI 37.17 kg/m2  SpO2 90%  Physical Exam  Constitutional: He is oriented to person, place, and time. He appears well-developed and well-nourished. No distress.  HENT:  Head: Normocephalic.  Eyes: Conjunctivae are normal. Pupils are equal, round, and reactive to light. No scleral icterus.  Neck: Normal range of motion. Neck supple. No thyromegaly present.  Cardiovascular: Normal rate and regular rhythm.  Exam reveals no gallop and no friction rub.   No murmur heard. Pulmonary/Chest: No respiratory distress. He has no wheezes. He has rhonchi. He has no rales.  Dyspneic at rest. Audible rhonchi. Diffuse rhonchi and crackles.   Abdominal: Soft. Bowel sounds are normal. He exhibits no distension. There is no tenderness. There is no rebound.  Musculoskeletal: Normal range of motion.  Neurological: He is alert and oriented to person, place, and time.  Skin: Skin is warm and dry. No rash noted.  Psychiatric: He has a normal mood and affect. His behavior is normal.    ED Course  Procedures (including critical care time)  DIAGNOSTIC STUDIES: Oxygen Saturation is 90% on room air, low by my interpretation.    COORDINATION OF CARE: 2:26 PM- Ordered EKG and a chest x-ray.   4:11 PM- Discussed that x-ray results were negative. Will order CBC, BMP, troponin, and BNP. Will order an albuterol breathing treatment to manage symptoms. Discussed treatment plan with  patient at bedside and patient verbalized agreement.     Labs Review Labs Reviewed  CBC WITH DIFFERENTIAL - Abnormal; Notable for the following:    WBC 14.6 (*)    Hemoglobin 10.9 (*)    HCT 33.5 (*)    MCH 25.8 (*)    Lymphocytes Relative 11 (*)    Monocytes Relative 13 (*)    Neutro Abs 11.1 (*)    Monocytes Absolute 1.9 (*)    All other components within normal limits  BASIC METABOLIC PANEL - Abnormal; Notable for the following:    Creatinine, Ser 1.50 (*)    GFR calc non Af Amer 47 (*)    GFR calc Af Amer 55 (*)    All other components within normal limits  PRO B NATRIURETIC PEPTIDE - Abnormal; Notable for the following:    Pro B Natriuretic peptide (BNP) 1143.0 (*)    All other components within normal limits  LACTIC ACID, PLASMA - Abnormal; Notable for the following:    Lactic Acid, Venous 2.4 (*)    All other components within normal limits  CBC - Abnormal; Notable for the following:    WBC 15.6 (*)  Hemoglobin 10.9 (*)    HCT 34.2 (*)    MCH 25.4 (*)    All other components within normal limits  COMPREHENSIVE METABOLIC PANEL - Abnormal; Notable for the following:    Sodium 134 (*)    Chloride 94 (*)    Glucose, Bld 327 (*)    BUN 26 (*)    Creatinine, Ser 1.80 (*)    Albumin 3.4 (*)    GFR calc non Af Amer 38 (*)    GFR calc Af Amer 44 (*)    All other components within normal limits  GLUCOSE, CAPILLARY - Abnormal; Notable for the following:    Glucose-Capillary 184 (*)    All other components within normal limits  GLUCOSE, CAPILLARY - Abnormal; Notable for the following:    Glucose-Capillary 327 (*)    All other components within normal limits  HEMOGLOBIN A1C - Abnormal; Notable for the following:    Hemoglobin A1C 8.9 (*)    Mean Plasma Glucose 209 (*)    All other components within normal limits  GLUCOSE, CAPILLARY - Abnormal; Notable for the following:    Glucose-Capillary 404 (*)    All other components within normal limits  CBG MONITORING, ED -  Abnormal; Notable for the following:    Glucose-Capillary 154 (*)    All other components within normal limits  CULTURE, BLOOD (ROUTINE X 2)  CULTURE, BLOOD (ROUTINE X 2)  MRSA PCR SCREENING  TROPONIN I  URINALYSIS, ROUTINE W REFLEX MICROSCOPIC  TROPONIN I  TROPONIN I  TROPONIN I  INFLUENZA PANEL BY PCR (TYPE A & B, H1N1)  LIPASE, BLOOD    Imaging Review Dg Chest 2 View  11/25/2013   CLINICAL DATA:  SHORTNESS OF BREATH  EXAM: CHEST  2 VIEW  COMPARISON:  DG CHEST 1V PORT dated 05/31/2013  FINDINGS: Low lung volumes. Cardiac silhouette is enlarged. Patient is status post median sternotomy coronary artery bypass grafting. Osseous structures unremarkable.  IMPRESSION: No active cardiopulmonary disease.   Electronically Signed   By: Margaree Mackintosh M.D.   On: 11/25/2013 14:54     EKG Interpretation   Date/Time:  Monday November 25 2013 14:25:55 EDT Ventricular Rate:  84 PR Interval:  226 QRS Duration: 166 QT Interval:  444 QTC Calculation: 524 R Axis:   -14 Text Interpretation:  Sinus rhythm with 1st degree A-V block Left bundle  branch block Abnormal ECG When compared with ECG of 03-Jun-2013 08:14, No  significant change was found      MDM   Final diagnoses:  Acute on chronic combined systolic and diastolic HF (heart failure), NYHA class 3  DM2 (diabetes mellitus, type 2)  S/P CABG x 3  CAP (community acquired pneumonia)    Care was discussed with Dr. Darrick Meigs. Is not hypoxemic. However, he remains quite dyspneic on room air and with any activity. Clinically, has a community acquired pneumonia.  Has signs of volume overload as well, and a hitory of CHf. . Cultures are obtained given antibiotics. Dr> Darrick Meigs is here, and  Has evaluated  The patient, and is planning admission.  I personally performed the services described in this documentation, which was scribed in my presence. The recorded information has been reviewed and is accurate.     Tanna Furry, MD 11/26/13 1540

## 2013-11-25 NOTE — ED Notes (Signed)
Pt with sob that started yesterday. Pts sats 88% on room air. Placed pt on 2l nasal cannula. Pt with crackles throughout and pitting edema to bilateral lower extremities.

## 2013-11-25 NOTE — ED Notes (Signed)
Complain of chronic SOB. States he ran a fever last night. Also, n/v

## 2013-11-25 NOTE — H&P (Signed)
PCP:   [name]., MD   Chief Complaint:  Shortness of breath  HPI:  68 year old man who  has a past medical history of Hypertension; Diabetes mellitus; History of valve replacement (2010); Thyroid disease; Stroke (2003); Coronary artery disease; Dyslipidemia; At risk for sudden cardiac death (05-Jul-2013); PAF (paroxysmal atrial fibrillation) (05/2013); Chronic anticoagulation (05/2013); S/P cardiac catheterization, Rt & Lt heart cath 06/05/2013 (07/05/2013); S/P CABG x 3 (2010); and GERD (gastroesophageal reflux disease). Presents to the ED with chief complaint of shortness of breath going on for the past 3 days. Patient has significant cardiac history and is currently on anticoagulation with Apixaban 4 dictated fibrillation. Patient has been coughing up phlegm, also had nausea with multiple episodes of vomiting, chills ,since last night. Patient was found to have elevated BNP, chest x-ray did not show significant abnormality but has leukocytosis with  20% bands. In the ED patient received Rocephin, Zithromax and 80 mg IV Lasix x1. Patient put out 1400 mL urine.   Allergies:  No Known Allergies    Past Medical History  Diagnosis Date  . Hypertension   . Diabetes mellitus     type 2   . History of valve replacement 2010    [facility] Ease bioprosthetic 83m  . Thyroid disease   . Stroke 2003    R-sided weakness & upper extremity swelling   . Coronary artery disease   . Dyslipidemia   . At risk for sudden cardiac death 111/21/2014 . PAF (paroxysmal atrial fibrillation) 05/2013    converted with amiodarone to SR  . Chronic anticoagulation 05/2013    started  . S/P cardiac catheterization, Rt & Lt heart cath 06/05/2013 1November 21, 2014 . S/P CABG x 3 2010  . GERD (gastroesophageal reflux disease)     Past Surgical History  Procedure Laterality Date  . Coronary artery bypass graft  01/26/2009    LIMA to LAD, SVG to circumflex, SVG to PDA  . [facility]  . Transthoracic echocardiogram  09/2011    grade 1 diastolic dysfunction; increasing valve gradient; calcified MV annulus   . Cardiac catheterization  06/05/2013    native 3 vessel disease; patent LIMA to LAD, SVG to OM and SVG to PDA; Elevated right and left heart filling pressures, with predominantly pulmonary venous hypertension, normal PVR  . Transthoracic echocardiogram  06/03/2013    EF 25-30%, mod conc. hypertrophy, grade 3 diastolic dysfunction; LA mod dilated; calcified MV annulus; transaortic gradients are normal for the bioprosthetic valve; inf vena cava dilated (elevated CVP) - LifeVest    Prior to Admission medications   Medication Sig Start Date End Date Taking? Authorizing Provider  acetaminophen (TYLENOL) 500 MG tablet Take 1,000 mg by mouth every 6 (six) hours as needed. For pain/headaches    Yes Historical Provider, MD  ALPRAZolam (XDuanne Moron1 MG tablet Take 1 mg by mouth 4 (four) times daily as needed for anxiety.    Yes Historical Provider, MD  amiodarone (PACERONE) 200 MG tablet Take 200 mg by mouth daily.  102014/11/21Yes LaCecilie KicksNP  apixaban (ELIQUIS) 5 MG TABS tablet Take 1 tablet (5 mg total) by mouth 2 (two) times daily. 102014-11-21Yes LaCecilie KicksNP  aspirin 81 MG EC tablet Take 81 mg by mouth daily.     Yes Historical Provider, MD  atorvastatin (LIPITOR) 20 MG tablet Take 1 tablet (20 mg total) by mouth daily at 6  PM. 06/07/13  Yes Cecilie Kicks, NP  bismuth subsalicylate (PEPTO BISMOL) 262 MG/15ML suspension Take 30 mLs by mouth every 6 (six) hours as needed for indigestion.   Yes Historical Provider, MD  docusate sodium (COLACE) 100 MG capsule Take 100 mg by mouth 2 (two) times daily.     Yes Historical Provider, MD  fenofibrate (TRICOR) 145 MG tablet Take 145 mg by mouth daily.   Yes Historical Provider, MD  gabapentin (NEURONTIN) 300 MG capsule Take 300 mg by mouth 3 (three) times daily.     Yes Historical  Provider, MD  insulin glargine (LANTUS) 100 UNIT/ML injection Inject 60-70 Units into the skin 2 (two) times daily. Takes70 units in the morning and 60 units in the evening   Yes Historical Provider, MD  insulin lispro (HUMALOG) 100 UNIT/ML injection Inject 35-45 Units into the skin 3 (three) times daily before meals. 45U bid and 35U QD   Yes Historical Provider, MD  levothyroxine (SYNTHROID, LEVOTHROID) 25 MCG tablet Take 25 mcg by mouth daily.     Yes Historical Provider, MD  metoprolol succinate (TOPROL XL) 25 MG 24 hr tablet Take 1 tablet (25 mg total) by mouth daily. 06/13/13  Yes Pixie Casino, MD  pantoprazole (PROTONIX) 40 MG tablet Take 40 mg by mouth 2 (two) times daily.   Yes Historical Provider, MD  torsemide (DEMADEX) 10 MG tablet Take 1 tablet (10 mg total) by mouth daily. 06/07/13  Yes Cecilie Kicks, NP    Social History:  reports that he quit smoking about 12 years ago. His smoking use included Cigarettes. He smoked 0.00 packs per day. He has quit using smokeless tobacco. His smokeless tobacco use included Chew. He reports that he does not drink alcohol or use illicit drugs.  Family History  Problem Relation Age of Onset  . Heart attack Mother   . Liver disease Brother   . Diabetes Sister     x4     All the positives are listed in BOLD  Review of Systems:  HEENT: Headache, blurred vision, runny nose, sore throat Neck: Hypothyroidism, hyperthyroidism,,lymphadenopathy Chest :  shortness of breath  history of COPD, Asthma Heart : Chest pain, history of coronary arterey disease GI:  Nausea, vomiting, diarrhea, constipation, GERD GU: Dysuria, urgency, frequency of urination, hematuria Neuro: Stroke, seizures, syncope Psych: Depression, anxiety, hallucinations   Physical Exam: Blood pressure 117/35, pulse 92, temperature 97.5 F (36.4 C), temperature source Oral, resp. rate 29, height 5' 7.5" (1.715 m), weight 109.317 kg (241 lb), SpO2 92.00%. Constitutional:    Patient is a well-developed and well-nourished male  in no acute distress and cooperative with exam. Head: Normocephalic and atraumatic Mouth: Mucus membranes moist Eyes: PERRL, EOMI, conjunctivae normal Neck: Supple, No Thyromegaly Cardiovascular: RRR, S1 normal, S2 normal Pulmonary/Chest: Bilateral rhonchi Abdominal: Soft. Non-tender, non-distended, bowel sounds are normal, no masses, organomegaly, or guarding present.  Neurological: A&O x3, Strenght is normal and symmetric bilaterally, cranial nerve II-XII are grossly intact, no focal motor deficit, sensory intact to light touch bilaterally.  Extremities : No Cyanosis, Clubbing or Edema   Labs on Admission:  Results for orders placed during the hospital encounter of 11/25/13 (from the past 48 hour(s))  CBC WITH DIFFERENTIAL     Status: Abnormal   Collection Time    11/25/13  4:18 PM      Result Value Ref Range   WBC 14.6 (*) 4.0 - 10.5 K/uL   RBC 4.23  4.22 - 5.81 MIL/uL   Hemoglobin  10.9 (*) 13.0 - 17.0 g/dL   HCT 33.5 (*) 39.0 - 52.0 %   MCV 79.2  78.0 - 100.0 fL   MCH 25.8 (*) 26.0 - 34.0 pg   MCHC 32.5  30.0 - 36.0 g/dL   RDW 14.4  11.5 - 15.5 %   Platelets 196  150 - 400 K/uL   Neutrophils Relative % 76  43 - 77 %   Lymphocytes Relative 11 (*) 12 - 46 %   Monocytes Relative 13 (*) 3 - 12 %   Eosinophils Relative 0  0 - 5 %   Basophils Relative 0  0 - 1 %   Neutro Abs 11.1 (*) 1.7 - 7.7 K/uL   Lymphs Abs 1.6  0.7 - 4.0 K/uL   Monocytes Absolute 1.9 (*) 0.1 - 1.0 K/uL   Eosinophils Absolute 0.0  0.0 - 0.7 K/uL   Basophils Absolute 0.0  0.0 - 0.1 K/uL   RBC Morphology POLYCHROMASIA PRESENT     WBC Morphology INCREASED BANDS (>20% BANDS)     Comment: ATYPICAL LYMPHOCYTES   Smear Review LARGE PLATELETS PRESENT     Comment: GIANT PLATELETS SEEN  BASIC METABOLIC PANEL     Status: Abnormal   Collection Time    11/25/13  4:18 PM      Result Value Ref Range   Sodium 141  137 - 147 mEq/L   Potassium 4.3  3.7 - 5.3 mEq/L    Chloride 101  96 - 112 mEq/L   CO2 27  19 - 32 mEq/L   Glucose, Bld 90  70 - 99 mg/dL   BUN 17  6 - 23 mg/dL   Creatinine, Ser 1.50 (*) 0.50 - 1.35 mg/dL   Calcium 9.2  8.4 - 10.5 mg/dL   GFR calc non Af Amer 47 (*) >90 mL/min   GFR calc Af Amer 55 (*) >90 mL/min   Comment: (NOTE)     The eGFR has been calculated using the CKD EPI equation.     This calculation has not been validated in all clinical situations.     eGFR's persistently <90 mL/min signify possible Chronic Kidney     Disease.  PRO B NATRIURETIC PEPTIDE     Status: Abnormal   Collection Time    11/25/13  4:18 PM      Result Value Ref Range   Pro B Natriuretic peptide (BNP) 1143.0 (*) 0 - 125 pg/mL  TROPONIN I     Status: None   Collection Time    11/25/13  4:18 PM      Result Value Ref Range   Troponin I <0.30  <0.30 ng/mL   Comment:            Due to the release kinetics of cTnI,     a negative result within the first hours     of the onset of symptoms does not rule out     myocardial infarction with certainty.     If myocardial infarction is still suspected,     repeat the test at appropriate intervals.  URINALYSIS, ROUTINE W REFLEX MICROSCOPIC     Status: None   Collection Time    11/25/13  5:41 PM      Result Value Ref Range   Color, Urine YELLOW  YELLOW   APPearance CLEAR  CLEAR   Specific Gravity, Urine 1.020  1.005 - 1.030   pH 5.0  5.0 - 8.0   Glucose, UA NEGATIVE  NEGATIVE mg/dL  Hgb urine dipstick NEGATIVE  NEGATIVE   Bilirubin Urine NEGATIVE  NEGATIVE   Ketones, ur NEGATIVE  NEGATIVE mg/dL   Protein, ur NEGATIVE  NEGATIVE mg/dL   Urobilinogen, UA 0.2  0.0 - 1.0 mg/dL   Nitrite NEGATIVE  NEGATIVE   Leukocytes, UA NEGATIVE  NEGATIVE   Comment: MICROSCOPIC NOT DONE ON URINES WITH NEGATIVE PROTEIN, BLOOD, LEUKOCYTES, NITRITE, OR GLUCOSE <1000 mg/dL.  LACTIC ACID, PLASMA     Status: Abnormal   Collection Time    11/25/13  5:55 PM      Result Value Ref Range   Lactic Acid, Venous 2.4 (*) 0.5 - 2.2  mmol/L    Radiological Exams on Admission: Dg Chest 2 View  11/25/2013   CLINICAL DATA:  SHORTNESS OF BREATH  EXAM: CHEST  2 VIEW  COMPARISON:  DG CHEST 1V PORT dated 05/31/2013  FINDINGS: Low lung volumes. Cardiac silhouette is enlarged. Patient is status post median sternotomy coronary artery bypass grafting. Osseous structures unremarkable.  IMPRESSION: No active cardiopulmonary disease.   Electronically Signed   By: Margaree Mackintosh M.D.   On: 11/25/2013 14:54    Assessment/Plan Active Problems:   DM2 (diabetes mellitus, type 2)   Acute on chronic combined systolic and diastolic HF (heart failure), NYHA class 3   S/P cardiac catheterization, Rt & Lt heart cath 06/05/2013   CHF exacerbation  Dyspnea   Seems multifactorial at this time. Patient has history of congestive heart failure last echocardiogram as of January 2015, shows EF of 40-50% mid and grade 1 diastolic dysfunction. He has elevated BNP. And has somewhat responded to high-dose Lasix given in the ED,  But patient also has leukocytosis with 20% bands, chest x-ray did not show pneumonia. Patient likely has associated acute bronchitis, and start patient on Solu-Medrol 60 mg IV every 6 hours, continue with Rocephin and Zithromax. Start Lasix 20 mg IV every 12 hours. Patient will be monitored in the step down unit.  Diabetes mellitus Continue Lantus, start sliding scale insulin  Nausea vomiting We will check liver function tests, Zofran when necessary for nausea vomiting.  Coronary artery disease Continue all medications including aspirin, metoprolol.  History of atrial fibrillation Continue amiodarone, Apixaban  Code status:patient is full code   Family discussion: no family at bedside    Time Spent on Admission:  75 minutes  [place] [place] [facility] Pager: [phone] 11/25/2013, 7:49 PM  If 7PM-7AM, please contact night-coverage  [email or web address]  Password [email or web address]

## 2013-11-26 ENCOUNTER — Inpatient Hospital Stay (HOSPITAL_COMMUNITY): Payer: Medicare HMO

## 2013-11-26 DIAGNOSIS — J189 Pneumonia, unspecified organism: Secondary | ICD-10-CM

## 2013-11-26 DIAGNOSIS — J9601 Acute respiratory failure with hypoxia: Secondary | ICD-10-CM

## 2013-11-26 LAB — GLUCOSE, CAPILLARY
GLUCOSE-CAPILLARY: 327 mg/dL — AB (ref 70–99)
GLUCOSE-CAPILLARY: 391 mg/dL — AB (ref 70–99)
GLUCOSE-CAPILLARY: 341 mg/dL — AB (ref 70–99)
Glucose-Capillary: 404 mg/dL — ABNORMAL HIGH (ref 70–99)

## 2013-11-26 LAB — CBC
HEMATOCRIT: 34.2 % — AB (ref 39.0–52.0)
HEMOGLOBIN: 10.9 g/dL — AB (ref 13.0–17.0)
MCH: 25.4 pg — AB (ref 26.0–34.0)
MCHC: 31.9 g/dL (ref 30.0–36.0)
MCV: 79.7 fL (ref 78.0–100.0)
Platelets: 168 10*3/uL (ref 150–400)
RBC: 4.29 MIL/uL (ref 4.22–5.81)
RDW: 14.5 % (ref 11.5–15.5)
WBC: 15.6 10*3/uL — ABNORMAL HIGH (ref 4.0–10.5)

## 2013-11-26 LAB — TROPONIN I: Troponin I: <0.3 ng/mL (ref <0.30)

## 2013-11-26 LAB — LIPASE, BLOOD: Lipase: 23 U/L (ref 11–59)

## 2013-11-26 LAB — COMPREHENSIVE METABOLIC PANEL
ALK PHOS: 42 U/L (ref 39–117)
ALT: 20 U/L (ref 0–53)
AST: 19 U/L (ref 0–37)
Albumin: 3.4 g/dL — ABNORMAL LOW (ref 3.5–5.2)
BUN: 26 mg/dL — AB (ref 6–23)
CO2: 22 mEq/L (ref 19–32)
CREATININE: 1.8 mg/dL — AB (ref 0.50–1.35)
Calcium: 8.8 mg/dL (ref 8.4–10.5)
Chloride: 94 mEq/L — ABNORMAL LOW (ref 96–112)
GFR calc non Af Amer: 38 mL/min — ABNORMAL LOW (ref >90)
GFR, EST AFRICAN AMERICAN: 44 mL/min — AB (ref >90)
GLUCOSE: 327 mg/dL — AB (ref 70–99)
POTASSIUM: 5.1 meq/L (ref 3.7–5.3)
Sodium: 134 mEq/L — ABNORMAL LOW (ref 137–147)
TOTAL PROTEIN: 7 g/dL (ref 6.0–8.3)
Total Bilirubin: 0.5 mg/dL (ref 0.3–1.2)

## 2013-11-26 LAB — HEMOGLOBIN A1C
Hgb A1c MFr Bld: 8.9 % — ABNORMAL HIGH (ref <5.7)
MEAN PLASMA GLUCOSE: 209 mg/dL — AB (ref <117)

## 2013-11-26 LAB — INFLUENZA PANEL BY PCR (TYPE A & B)
H1N1FLUPCR: NOT DETECTED
Influenza A By PCR: NEGATIVE
Influenza B By PCR: NEGATIVE

## 2013-11-26 MED ORDER — INSULIN ASPART 100 UNIT/ML ~~LOC~~ SOLN
0.0000 [IU] | Freq: Every day | SUBCUTANEOUS | Status: DC
  Administered 2013-11-26: 4 [IU] via SUBCUTANEOUS
  Administered 2013-11-27: 3 [IU] via SUBCUTANEOUS

## 2013-11-26 MED ORDER — INSULIN ASPART 100 UNIT/ML ~~LOC~~ SOLN
40.0000 [IU] | Freq: Once | SUBCUTANEOUS | Status: AC
  Administered 2013-11-26: 40 [IU] via SUBCUTANEOUS

## 2013-11-26 MED ORDER — GUAIFENESIN-DM 100-10 MG/5ML PO SYRP
5.0000 mL | ORAL_SOLUTION | ORAL | Status: DC | PRN
  Administered 2013-11-26 – 2013-11-28 (×2): 5 mL via ORAL
  Filled 2013-11-26 (×2): qty 5

## 2013-11-26 MED ORDER — INSULIN ASPART 100 UNIT/ML ~~LOC~~ SOLN
0.0000 [IU] | Freq: Three times a day (TID) | SUBCUTANEOUS | Status: DC
  Administered 2013-11-26: 15 [IU] via SUBCUTANEOUS
  Administered 2013-11-26: 20 [IU] via SUBCUTANEOUS
  Administered 2013-11-27 (×2): 7 [IU] via SUBCUTANEOUS
  Administered 2013-11-27: 11 [IU] via SUBCUTANEOUS
  Administered 2013-11-28: 4 [IU] via SUBCUTANEOUS
  Administered 2013-11-28: 3 [IU] via SUBCUTANEOUS

## 2013-11-26 MED ORDER — DEXTROSE 5 % IV SOLN
INTRAVENOUS | Status: AC
  Filled 2013-11-26: qty 10

## 2013-11-26 MED ORDER — INSULIN ASPART 100 UNIT/ML ~~LOC~~ SOLN
20.0000 [IU] | Freq: Three times a day (TID) | SUBCUTANEOUS | Status: DC
  Administered 2013-11-26: 20 [IU] via SUBCUTANEOUS

## 2013-11-26 MED ORDER — INSULIN ASPART 100 UNIT/ML ~~LOC~~ SOLN
25.0000 [IU] | Freq: Three times a day (TID) | SUBCUTANEOUS | Status: DC
  Administered 2013-11-27 – 2013-11-28 (×3): 25 [IU] via SUBCUTANEOUS

## 2013-11-26 NOTE — Progress Notes (Signed)
Inpatient Diabetes Program Recommendations  AACE/ADA: New Consensus Statement on Inpatient Glycemic Control (2013)  Target Ranges:  Prepandial:   less than 140 mg/dL      Peak postprandial:   less than 180 mg/dL (1-2 hours)      Critically ill patients:  140 - 180 mg/dL  Results for [name], [name] (MRN [id number]) as of 11/26/2013 08:44  Ref. Range 06/01/2013 08:20  Hemoglobin A1C Latest Range: <5.7 % 10.1 (H)   Results for [name], [name] (MRN [id number]) as of 11/26/2013 08:44  Ref. Range 11/25/2013 20:04 11/25/2013 21:20 11/26/2013 07:37  Glucose-Capillary Latest Range: 70-99 mg/dL 154 (H) 184 (H) 327 (H)   Diabetes history: DM2 Outpatient Diabetes medications: Lantus 70 units QAM, Lantus 60 units QPM, Humalog 35-45 units TID with meals Current orders for Inpatient glycemic control: Lantus 70 units QAM, Lantus 60 units QHS, Novolog 0-20 units AC, Novolog 0-5 units HS  Inpatient Diabetes Program Recommendations Insulin - Meal Coverage: Once diet is advanced to Carb Modified diet, please consider ordering Novolog 10 units meal coverage ACHS. HgbA1C: Please consider ordering an A1C to evaluate glycemic control over the past 2-3 months. Diet: Once diet is advanced, please order Carb Modified Diabetic diet.  Thanks, Barnie Alderman, RN, MSN, CCRN Diabetes Coordinator Inpatient Diabetes Program [phone] (Team Pager) [phone] (AP office) [phone] [facility] office)

## 2013-11-26 NOTE — Progress Notes (Signed)
UR chart review completed.  

## 2013-11-26 NOTE — Progress Notes (Addendum)
[facility] PROGRESS NOTE  THESEUS BIRNIE QJJ:[id number] DOB: 14-Apr-1946 DOA: 11/25/2013 PCP: [name]., MD  Brief summary  68 year old man who has a past medical history of Hypertension; Diabetes mellitus; History of valve replacement (2010); Thyroid disease; Stroke (2003); Coronary artery disease s/p CABG; Dyslipidemia; At risk for sudden cardiac death (June 11, 2013); PAF (paroxysmal atrial fibrillation) (05/2013); Chronic anticoagulation (05/2013) who p/w shortness of breath, cough, fever, post-tussive emesis going on for the past 3 days. Patient was found to have elevated BNP, chest x-ray did not show significant abnormality but has leukocytosis with 20% bands. Diuresed somewhat in ER and started on tx for CAP.  Clinically improving.  Transfer from stepdown to telemetry today.    Assessment/Plan  Acute hypoxic respiratory failure, likely secondary to community-acquired pneumonia given fever and leukocytosis.  Still having some flu in [place] -  Continue ceftriaxone and azithromycin -  Strep pneumo and Legionella antigens -  Check flu PCR -  Wean oxygen as tolerated -  Followup blood cultures  History of cigarette smoking, at risk for COPD, but no personal history of COPD and not on any medications for this condition -  Discontinue Solu-Medrol  Elevated proBNP, may have been suggestive of acute heart failure however his chest x-ray was negative. ProBNP may also be elevated in the setting of pneumonia. ProBNP is less than during his previous admission. -  Discontinue diuretics  Ischemic cardiomyopathy/chronic systolic heart failure with ejection fraction of 40-50% and grade 1 diastolic heart failure -  Judicious use of IV fluids -  Hold diuretics given concern for developing sepsis  Diabetes mellitus, CBG elevated this morning. - Discontinue Solu-Medrol -  Increase sliding scale insulin -  Continue lantus -  A1c  Nausea vomiting, sounds post-tussive. LFTs wnl -  Add  Lipase -  Urinalysis neg -  Continue PPI -  Advance diet  Coronary artery disease, s/p CABG stable.   Continue all medications including aspirin, metoprolol.   Paroxysmal atrial fibrillation  -  Continue amiodarone, Apixaban -  Telemery:  SR, LBBB  CKD stage 3, creatinine near baseline, but rose somewhat after lasix yesterday.  Decreased sodium and chloride suggest that he is mildly dehydrated -  Hold lasix -  Minimize nephrotoxins -  Renally dose medications  Leukocytosis, rose slightly since yesterday however this may be due to the Solu-Medrol. -  Repeat CBC tomorrow after her stopping Solu-Medrol  Diet:  CLD Access:  PIV IVF:  off Proph:  apixaban  Code Status: full Family Communication: patient alone Disposition Plan: transfer to telemetry   Consultants:  None  Procedures:  CXR  Antibiotics:  Ceftriaxone 4/13 >>  Azithromycin 4/13 >>   HPI/Subjective:  States he feels much better today. He continues to have severe cough.   Objective: Filed Vitals:   11/26/13 0600 11/26/13 0700 11/26/13 0748 11/26/13 0800  BP: 127/47 122/47  124/54  Pulse: 68 67  79  Temp:   97.9 F (36.6 C)   TempSrc:   Oral   Resp: 23 21  25   Height:      Weight:      SpO2: 96% 96%  97%    Intake/Output Summary (Last 24 hours) at 11/26/13 0932 Last data filed at 11/26/13 0500  Gross per 24 hour  Intake    240 ml  Output   3900 ml  Net  -3660 ml   Filed Weights   11/25/13 1421 11/25/13 2045 11/26/13 0500  Weight: 109.317 kg (241 lb) 109.6 kg (241 lb  10 oz) 109.1 kg (240 lb 8.4 oz)    Exam:   General:  Caucasian male, No acute distress  HEENT:  NCAT, MMM  Cardiovascular:  RRR, nl S1, S2 2/6 systolic murmur at the right upper sternal border, 2+ pulses, warm extremities  Respiratory:  progress, no wheezes or focal rales, no increased WOB  Abdomen:   NABS, soft, NT/ND  MSK:   Normal tone and bulk, no LEE  Neuro:  Grossly intact  Data Reviewed: Basic  Metabolic Panel:  Recent Labs Lab 11/25/13 1618 11/26/13 0301  NA 141 134*  K 4.3 5.1  CL 101 94*  CO2 27 22  GLUCOSE 90 327*  BUN 17 26*  CREATININE 1.50* 1.80*  CALCIUM 9.2 8.8   Liver Function Tests:  Recent Labs Lab 11/26/13 0301  AST 19  ALT 20  ALKPHOS 42  BILITOT 0.5  PROT 7.0  ALBUMIN 3.4*   No results found for this basename: LIPASE, AMYLASE,  in the last 168 hours No results found for this basename: AMMONIA,  in the last 168 hours CBC:  Recent Labs Lab 11/25/13 1618 11/26/13 0301  WBC 14.6* 15.6*  NEUTROABS 11.1*  --   HGB 10.9* 10.9*  HCT 33.5* 34.2*  MCV 79.2 79.7  PLT 196 168   Cardiac Enzymes:  Recent Labs Lab 11/25/13 1618 11/25/13 2105 11/26/13 0301  TROPONINI <0.30 <0.30 <0.30   BNP (last 3 results)  Recent Labs  06/02/13 0540 11/25/13 1618  PROBNP 2717.0* 1143.0*   CBG:  Recent Labs Lab 11/25/13 2004 11/25/13 2120 11/26/13 0737  GLUCAP 154* 184* 327*    Recent Results (from the past 240 hour(s))  MRSA PCR SCREENING     Status: None   Collection Time    11/25/13  8:40 PM      Result Value Ref Range Status   MRSA by PCR NEGATIVE  NEGATIVE Final   Comment:            The GeneXpert MRSA Assay (FDA     approved for NASAL specimens     only), is one component of a     comprehensive MRSA colonization     surveillance program. It is not     intended to diagnose MRSA     infection nor to guide or     monitor treatment for     MRSA infections.     Studies: Dg Chest 2 View  11/25/2013   CLINICAL DATA:  SHORTNESS OF BREATH  EXAM: CHEST  2 VIEW  COMPARISON:  DG CHEST 1V PORT dated 05/31/2013  FINDINGS: Low lung volumes. Cardiac silhouette is enlarged. Patient is status post median sternotomy coronary artery bypass grafting. Osseous structures unremarkable.  IMPRESSION: No active cardiopulmonary disease.   Electronically Signed   By: Margaree Mackintosh M.D.   On: 11/25/2013 14:54    Scheduled Meds: . amiodarone  200 mg Oral  Daily  . apixaban  5 mg Oral BID  . aspirin EC  81 mg Oral Daily  . atorvastatin  20 mg Oral q1800  . azithromycin  500 mg Intravenous Q24H  . cefTRIAXone (ROCEPHIN)  IV  1 g Intravenous Q24H  . docusate sodium  100 mg Oral BID  . fenofibrate  160 mg Oral Daily  . gabapentin  300 mg Oral TID  . insulin aspart  0-20 Units Subcutaneous TID WC  . insulin aspart  0-5 Units Subcutaneous QHS  . insulin glargine  60 Units Subcutaneous QHS  . insulin glargine  70 Units Subcutaneous Daily  . levothyroxine  25 mcg Oral QAC breakfast  . metoprolol succinate  25 mg Oral Daily  . pantoprazole  40 mg Oral BID  . sodium chloride  3 mL Intravenous Q12H   Continuous Infusions:   Active Problems:   GERD (gastroesophageal reflux disease)   DM2 (diabetes mellitus, type 2)   LBBB (left bundle branch block)   Acute on chronic combined systolic and diastolic HF (heart failure), NYHA class 3   S/P cardiac catheterization, Rt & Lt heart cath 06/05/2013   CHF exacerbation   Acute respiratory failure with hypoxia   CAP (community acquired pneumonia)    Time spent: 30 min    [facility] Pager [phone]. If 7PM-7AM, please contact night-coverage at [email or web address], password [facility] 11/26/2013, 9:32 AM  LOS: 1 day

## 2013-11-26 NOTE — Care Management Note (Signed)
    Page 1 of 1   11/26/2013     12:57:22 PM   CARE MANAGEMENT NOTE 11/26/2013  Patient:  [name], [name]   Account Number:  [id number]  Date Initiated:  11/26/2013  Documentation initiated by:  Theophilus Kinds  Subjective/Objective Assessment:   Pt admitted from home with dyspnea and CHF. Pt lives with his sister and will return home at discharge. Pt is able to completed most ADL's with little assistance. Pt has a cane, walker, w/c, and ankle brace.     Action/Plan:   Pt would benefit greatly with [facility] RN and PT at discharge. Pt agreeable and chooses [facility] for [facility] services. Will arrange at discharge. May need home O2 as well.   Anticipated DC Date:  11/28/2013   Anticipated DC Plan:  [place]  CM consult      [facility] Choice  HOME HEALTH   Choice offered to / List presented to:  C-1 Patient        [place] arranged  HH-1 RN  [place].   Status of service:  Completed, signed off Medicare Important Message given?   (If response is "NO", the following Medicare IM given date fields will be blank) Date Medicare IM given:   Date Additional Medicare IM given:    Discharge Disposition:  [place]  Per UR Regulation:    If discussed at Long Length of Stay Meetings, dates discussed:    Comments:  11/26/13 [place], RN BSN CM

## 2013-11-27 DIAGNOSIS — J9600 Acute respiratory failure, unspecified whether with hypoxia or hypercapnia: Secondary | ICD-10-CM

## 2013-11-27 DIAGNOSIS — IMO0001 Reserved for inherently not codable concepts without codable children: Secondary | ICD-10-CM

## 2013-11-27 DIAGNOSIS — I2589 Other forms of chronic ischemic heart disease: Secondary | ICD-10-CM

## 2013-11-27 DIAGNOSIS — E1165 Type 2 diabetes mellitus with hyperglycemia: Secondary | ICD-10-CM

## 2013-11-27 LAB — CBC
HCT: 31.2 % — ABNORMAL LOW (ref 39.0–52.0)
Hemoglobin: 9.9 g/dL — ABNORMAL LOW (ref 13.0–17.0)
MCH: 25.3 pg — AB (ref 26.0–34.0)
MCHC: 31.7 g/dL (ref 30.0–36.0)
MCV: 79.8 fL (ref 78.0–100.0)
PLATELETS: 195 10*3/uL (ref 150–400)
RBC: 3.91 MIL/uL — AB (ref 4.22–5.81)
RDW: 14.3 % (ref 11.5–15.5)
WBC: 12 10*3/uL — ABNORMAL HIGH (ref 4.0–10.5)

## 2013-11-27 LAB — GLUCOSE, CAPILLARY
Glucose-Capillary: 239 mg/dL — ABNORMAL HIGH (ref 70–99)
Glucose-Capillary: 265 mg/dL — ABNORMAL HIGH (ref 70–99)
Glucose-Capillary: 249 mg/dL — ABNORMAL HIGH (ref 70–99)
Glucose-Capillary: 293 mg/dL — ABNORMAL HIGH (ref 70–99)

## 2013-11-27 LAB — BASIC METABOLIC PANEL
BUN: 44 mg/dL — ABNORMAL HIGH (ref 6–23)
CALCIUM: 9.1 mg/dL (ref 8.4–10.5)
CO2: 32 mEq/L (ref 19–32)
Chloride: 99 mEq/L (ref 96–112)
Creatinine, Ser: 1.61 mg/dL — ABNORMAL HIGH (ref 0.50–1.35)
GFR, EST AFRICAN AMERICAN: 50 mL/min — AB (ref >90)
GFR, EST NON AFRICAN AMERICAN: 43 mL/min — AB (ref >90)
GLUCOSE: 280 mg/dL — AB (ref 70–99)
POTASSIUM: 4.7 meq/L (ref 3.7–5.3)
Sodium: 140 mEq/L (ref 137–147)

## 2013-11-27 MED ORDER — TORSEMIDE 20 MG PO TABS
10.0000 mg | ORAL_TABLET | Freq: Every day | ORAL | Status: DC
  Administered 2013-11-28: 10 mg via ORAL
  Filled 2013-11-27: qty 1

## 2013-11-27 NOTE — Progress Notes (Signed)
PROGRESS NOTE  [name] [id number] DOB: Sep 25, 1945 DOA: 11/25/2013 PCP: [name]., MD  Assessment/Plan: Acute hypoxic respiratory failure, likely secondary to community-acquired pneumonia given fever and leukocytosis. Still having some flu in [place]  - Continue ceftriaxone and azithromycin  - Strep pneumo and Legionella antigens  - Check flu PCR--neg  - Wean oxygen as tolerated  - Followup blood cultures  History of cigarette smoking, at risk for COPD, but no personal history of COPD and not on any medications for this condition  - Discontinue Solu-Medrol  -patient has approximately 60-pack-year history  Elevated proBNP, may have been suggestive of acute heart failure however his chest x-ray was negative. ProBNP may also be elevated in the setting of pneumonia. ProBNP is less than during his previous admission.  Ischemic cardiomyopathy/chronic systolic heart failure with ejection fraction of 40-50% and grade 1 diastolic heart failure  - Judicious use of IV fluids if necessary  - Restart torsemide 11/28/2013 is stable  Diabetes mellitus,  -CBG elevated partly due to Solu-Medrol  - Increase sliding scale insulin  - Continue lantus  - A1c--8.9  Nausea vomiting, sounds post-tussive. LFTs wnl  - Add Lipase--23 - Urinalysis neg  - Continue PPI  - Advance diet-patient is now tolerating his diet- Coronary artery disease, s/p CABG stable.  Continue all medications including aspirin, metoprolol.  Paroxysmal atrial fibrillation  - Continue amiodarone, Apixaban  - Telemery: SR, LBBB  CKD stage 3,  -creatinine near baseline, but rose somewhat after lasix 11/25/2013.  - Restart torsemide in the morning - Minimize nephrotoxins  - Renally dose medications  Leukocytosis, rose slightly since yesterday however this may be due to the Solu-Medrol.  -WBC improving after stopping Solu-Medrol History of tissue AVR-2010 -Hemodynamically stable  Family Communication:    Pt at beside Disposition Plan:   Home when medically stable      Procedures/Studies: Dg Chest 2 View  11/25/2013   CLINICAL DATA:  SHORTNESS OF BREATH  EXAM: CHEST  2 VIEW  COMPARISON:  DG CHEST 1V PORT dated 05/31/2013  FINDINGS: Low lung volumes. Cardiac silhouette is enlarged. Patient is status post median sternotomy coronary artery bypass grafting. Osseous structures unremarkable.  IMPRESSION: No active cardiopulmonary disease.   Electronically Signed   By: Margaree Mackintosh M.D.   On: 11/25/2013 14:54   Dg Chest Port 1 View  11/26/2013   CLINICAL DATA:  Shortness of breath, fever, hypertension, diabetes, coronary artery disease post CABG  EXAM: PORTABLE CHEST - 1 VIEW  COMPARISON:  Portable exam 1353 hr compared to 11/25/2013  FINDINGS: Enlargement of cardiac silhouette post median sternotomy and CABG.  Slight pulmonary vascular congestion.  Mediastinal contours normal.  Minimal bibasilar atelectasis.  No acute failure or consolidation.  No pleural effusion or pneumothorax.  IMPRESSION: Enlargement of cardiac silhouette with pulmonary vascular congestion post CABG.  Bibasilar atelectasis.   Electronically Signed   By: Lavonia Dana M.D.   On: 11/26/2013 15:19       Patient is breathing 50% better. He denies any fevers, chills, chest pain, vomiting, diarrhea, abdominal pain, dysuria, hematuria, headache, dizziness.  Subjective:  patient is breathing 50% better. He  Objective: Filed Vitals:   11/26/13 1334 11/26/13 2022 11/27/13 0644 11/27/13 1406  BP: 125/33 141/60 108/64 121/73  Pulse: 68 71 65 70  Temp: 97.6 F (36.4 C) 98 F (36.7 C)  97 F (36.1 C)  TempSrc: Oral Oral  Oral  Resp: 18 20 20  20  Height:      Weight:   109.226 kg (240 lb 12.8 oz)   SpO2: 98% 98% 100% 98%    Intake/Output Summary (Last 24 hours) at 11/27/13 1832 Last data filed at 11/27/13 1533  Gross per 24 hour  Intake    770 ml  Output   2450 ml  Net  -1680 ml   Weight change: -0.091 kg (-3.2  oz) Exam:   General:  Pt is alert, follows commands appropriately, not in acute distress  HEENT: No icterus, No thrush, No neck mass, [place]/AT  Cardiovascular: RRR, S1/S2, no rubs, no gallops  Respiratory: Bibasilar crackles R>L. No wheezing.  Abdomen: Soft/+BS, non tender, non distended, no guarding  Extremities: trace LE edema, No lymphangitis, No petechiae, No rashes, no synovitis  Data Reviewed: Basic Metabolic Panel:  Recent Labs Lab 11/25/13 1618 11/26/13 0301 11/27/13 0542  NA 141 134* 140  K 4.3 5.1 4.7  CL 101 94* 99  CO2 27 22 32  GLUCOSE 90 327* 280*  BUN 17 26* 44*  CREATININE 1.50* 1.80* 1.61*  CALCIUM 9.2 8.8 9.1   Liver Function Tests:  Recent Labs Lab 11/26/13 0301  AST 19  ALT 20  ALKPHOS 42  BILITOT 0.5  PROT 7.0  ALBUMIN 3.4*    Recent Labs Lab 11/26/13 0935  LIPASE 23   No results found for this basename: AMMONIA,  in the last 168 hours CBC:  Recent Labs Lab 11/25/13 1618 11/26/13 0301 11/27/13 0542  WBC 14.6* 15.6* 12.0*  NEUTROABS 11.1*  --   --   HGB 10.9* 10.9* 9.9*  HCT 33.5* 34.2* 31.2*  MCV 79.2 79.7 79.8  PLT 196 168 195   Cardiac Enzymes:  Recent Labs Lab 11/25/13 1618 11/25/13 2105 11/26/13 0301 11/26/13 0935  TROPONINI <0.30 <0.30 <0.30 <0.30   BNP: No components found with this basename: POCBNP,  CBG:  Recent Labs Lab 11/26/13 1620 11/26/13 2225 11/27/13 0739 11/27/13 1132 11/27/13 1637  GLUCAP 391* 341* 239* 265* 249*    Recent Results (from the past 240 hour(s))  CULTURE, BLOOD (ROUTINE X 2)     Status: None   Collection Time    11/25/13  6:07 PM      Result Value Ref Range Status   Specimen Description BLOOD RIGHT HAND   Final   Special Requests     Final   Value: BOTTLES DRAWN AEROBIC AND ANAEROBIC AEB=8CC ANA=5CC   Culture NO GROWTH 2 DAYS   Final   Report Status PENDING   Incomplete  CULTURE, BLOOD (ROUTINE X 2)     Status: None   Collection Time    11/25/13  6:09 PM      Result  Value Ref Range Status   Specimen Description BLOOD LEFT HAND   Final   Special Requests BOTTLES DRAWN AEROBIC ONLY 3CC   Final   Culture NO GROWTH 2 DAYS   Final   Report Status PENDING   Incomplete  MRSA PCR SCREENING     Status: None   Collection Time    11/25/13  8:40 PM      Result Value Ref Range Status   MRSA by PCR NEGATIVE  NEGATIVE Final   Comment:            The GeneXpert MRSA Assay (FDA     approved for NASAL specimens     only), is one component of a     comprehensive MRSA colonization     surveillance program. It is  not     intended to diagnose MRSA     infection nor to guide or     monitor treatment for     MRSA infections.     Scheduled Meds: . amiodarone  200 mg Oral Daily  . apixaban  5 mg Oral BID  . aspirin EC  81 mg Oral Daily  . atorvastatin  20 mg Oral q1800  . azithromycin  500 mg Intravenous Q24H  . cefTRIAXone (ROCEPHIN)  IV  1 g Intravenous Q24H  . docusate sodium  100 mg Oral BID  . fenofibrate  160 mg Oral Daily  . gabapentin  300 mg Oral TID  . insulin aspart  0-20 Units Subcutaneous TID WC  . insulin aspart  0-5 Units Subcutaneous QHS  . insulin aspart  25 Units Subcutaneous TID WC  . insulin glargine  60 Units Subcutaneous QHS  . insulin glargine  70 Units Subcutaneous Daily  . levothyroxine  25 mcg Oral QAC breakfast  . metoprolol succinate  25 mg Oral Daily  . pantoprazole  40 mg Oral BID  . sodium chloride  3 mL Intravenous Q12H   Continuous Infusions:    Orson Eva, DO  [facility] Pager [phone]  If 7PM-7AM, please contact night-coverage [email or web address] Password [facility] 11/27/2013, 6:32 PM   LOS: 2 days

## 2013-11-27 NOTE — Progress Notes (Signed)
Inpatient Diabetes Program Recommendations  AACE/ADA: New Consensus Statement on Inpatient Glycemic Control (2013)  Target Ranges:  Prepandial:   less than 140 mg/dL      Peak postprandial:   less than 180 mg/dL (1-2 hours)      Critically ill patients:  140 - 180 mg/dL   Results for BRADY, SCHILLER (MRN [id number]) as of 11/27/2013 09:42  Ref. Range 11/26/2013 07:37 11/26/2013 11:30 11/26/2013 16:20 11/26/2013 22:25 11/27/2013 07:39  Glucose-Capillary Latest Range: 70-99 mg/dL 327 (H) 404 (H) 391 (H) 341 (H) 239 (H)   Diabetes history: DM2  Outpatient Diabetes medications: Lantus 70 units QAM, Lantus 60 units QPM, Humalog 35-45 units TID with meals  Current orders for Inpatient glycemic control: Lantus 70 units QAM, Lantus 60 units QHS, Novolog 0-20 units AC, Novolog 0-5 units HS, Novolog 25 units TID with meals  Inpatient Diabetes Program Recommendations Insulin - Basal: Please consider increasing am dose of Lantus to 72 units QAM and pm dose of Lantus to 62 units QHS. Insulin - Meal Coverage: Please consider increasing Novolog meal coverage to 30 units TID with meals.  Thanks, Barnie Alderman, RN, MSN, CCRN Diabetes Coordinator Inpatient Diabetes Program [phone] (Team Pager) [phone] ([facility] office) [phone] [facility] office)

## 2013-11-27 NOTE — Plan of Care (Signed)
Problem: Phase I Progression Outcomes Goal: Up in chair, BRP Outcome: Completed/Met Date Met:  11/27/13 Patient up to chair this afternoon.  Tolerated well.

## 2013-11-28 LAB — BASIC METABOLIC PANEL
BUN: 36 mg/dL — ABNORMAL HIGH (ref 6–23)
CHLORIDE: 100 meq/L (ref 96–112)
CO2: 31 mEq/L (ref 19–32)
Calcium: 9 mg/dL (ref 8.4–10.5)
Creatinine, Ser: 1.37 mg/dL — ABNORMAL HIGH (ref 0.50–1.35)
GFR calc non Af Amer: 53 mL/min — ABNORMAL LOW (ref >90)
GFR, EST AFRICAN AMERICAN: 61 mL/min — AB (ref >90)
Glucose, Bld: 125 mg/dL — ABNORMAL HIGH (ref 70–99)
POTASSIUM: 4.7 meq/L (ref 3.7–5.3)
SODIUM: 140 meq/L (ref 137–147)

## 2013-11-28 LAB — GLUCOSE, CAPILLARY
GLUCOSE-CAPILLARY: 171 mg/dL — AB (ref 70–99)
Glucose-Capillary: 137 mg/dL — ABNORMAL HIGH (ref 70–99)

## 2013-11-28 LAB — CBC
HCT: 31.7 % — ABNORMAL LOW (ref 39.0–52.0)
HEMOGLOBIN: 9.9 g/dL — AB (ref 13.0–17.0)
MCH: 25 pg — ABNORMAL LOW (ref 26.0–34.0)
MCHC: 31.2 g/dL (ref 30.0–36.0)
MCV: 80.1 fL (ref 78.0–100.0)
Platelets: 219 10*3/uL (ref 150–400)
RBC: 3.96 MIL/uL — AB (ref 4.22–5.81)
RDW: 14.1 % (ref 11.5–15.5)
WBC: 9.3 10*3/uL (ref 4.0–10.5)

## 2013-11-28 MED ORDER — AZITHROMYCIN 250 MG PO TABS: 500.0000 mg | ORAL_TABLET | Freq: Every day | ORAL | Status: DC

## 2013-11-28 MED ORDER — CEFDINIR 300 MG PO CAPS: 300.0000 mg | ORAL_CAPSULE | Freq: Two times a day (BID) | ORAL | Status: DC

## 2013-11-28 MED ORDER — CEFPODOXIME PROXETIL 200 MG PO TABS
200.0000 mg | ORAL_TABLET | Freq: Two times a day (BID) | ORAL | Status: DC
  Filled 2013-11-28 (×2): qty 1

## 2013-11-28 MED ORDER — AZITHROMYCIN 500 MG PO TABS: 500.0000 mg | ORAL_TABLET | Freq: Every day | ORAL | Status: DC

## 2013-11-28 NOTE — Discharge Summary (Signed)
Physician Discharge Summary  VIN YONKE SAY:[id number] DOB: 1946-04-08 DOA: 11/25/2013  PCP: [name]., MD  Admit date: 11/25/2013 Discharge date: 11/28/2013  Recommendations for Outpatient Follow-up:  1. Pt will need to follow up with PCP in 2 weeks post discharge 2. Please obtain BMP to evaluate electrolytes and kidney function 3. Please also check CBC to evaluate Hg and Hct levels   Discharge Diagnoses:  Active Problems:   GERD (gastroesophageal reflux disease)   DM2 (diabetes mellitus, type 2)   LBBB (left bundle branch block)   Acute on chronic combined systolic and diastolic HF (heart failure), NYHA class 3   S/P cardiac catheterization, Rt & Lt heart cath 06/05/2013   CHF exacerbation   Acute respiratory failure with hypoxia   CAP (community acquired pneumonia)   Type II or unspecified type diabetes mellitus without mention of complication, uncontrolled Acute hypoxic respiratory failure, likely secondary to community-acquired pneumonia given fever and leukocytosis.  - Continued ceftriaxone and azithromycin IV during hospitalization - Strep pneumo and Legionella antigens  - Check flu PCR--neg  - The patient was weaned to room air without any desaturation. Laboratory pulse ox did not show any option desaturation - Followup blood cultures--negative -The patient will continue 2 more days of azithromycin to finish a five-day course. -Cefdinir x 4 additional days after d/c to finish 7 day course -Repeat EKG was obtained to reevaluate QTc interval which remained stable--actually improved from the day of admission--as a result, azithromycin was continued History of cigarette smoking, at risk for COPD, but no personal history of COPD and not on any medications for this condition  - Discontinue Solu-Medrol  -patient has approximately 60-pack-year history  Elevated proBNP, may have been suggestive of acute heart failure however his chest x-ray was negative. ProBNP may  also be elevated in the setting of pneumonia. ProBNP is less than during his previous admission.  -The patient was restarted on torsemide -The patient remained clinically euvolemic Ischemic cardiomyopathy/chronic systolic heart failure with ejection fraction of 40-50% and grade 1 diastolic heart failure  - Judicious use of IV fluids if necessary  - Restart torsemide 11/28/2013 is stable  Diabetes mellitus,  -CBG elevated partly due to Solu-Medrol  - Increase sliding scale insulin  - Continue lantus 70 units in the morning, 16 units at bedtime -The patient will restart his home pre-meal NovoLog dosing - A1c--8.9  Nausea vomiting, sounds post-tussive. LFTs wnl  - Add Lipase--23  - Urinalysis neg  - Continue PPI  - Advance diet-patient is now tolerating his diet-  Coronary artery disease, s/p CABG stable.  Continue all medications including aspirin, metoprolol.  Paroxysmal atrial fibrillation  - Continue amiodarone, Apixaban  - Telemery: SR, LBBB  CKD stage 3,  -creatinine near baseline, but rose somewhat after lasix 11/25/2013.  - Restart torsemide in the morning  - Minimize nephrotoxins  - Renally dose medications  -Serum creatinine 1.37 date discharged -Baseline creatinine 1.3-1.6 Leukocytosis, rose slightly since yesterday however this may be due to the Solu-Medrol.  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 RN and PT)    Contact information:   [place] [id number] [phone]      Diet: Provider modified Wt Readings from Last 3 Encounters:  11/28/13 108.047 kg (238 lb 3.2 oz)  09/17/13 113.218 kg (249 lb 9.6 oz)  07/24/13  111.948 kg (246 lb 12.8 oz)    History of present illness:  68 year old man who has a past medical history of Hypertension; Diabetes  mellitus; History of valve replacement (2010); Thyroid disease; Stroke (2003); Coronary artery disease; Dyslipidemia; At risk for sudden cardiac death (June 17, 2013); PAF (paroxysmal atrial fibrillation) (05/2013); Chronic anticoagulation (05/2013); S/P cardiac catheterization, Rt & Lt heart cath 06/05/2013 (2013-06-17); S/P CABG x 3 (2010); and GERD (gastroesophageal reflux disease).  Presents to the ED with chief complaint of shortness of breath going on for the past 3 days. Patient has significant cardiac history and is currently on anticoagulation with Apixaban 4 dictated fibrillation. Patient has been coughing up phlegm, also had nausea with multiple episodes of vomiting, chills ,since last night. Patient was found to have elevated BNP, chest x-ray did not show significant abnormality but has leukocytosis with 20% bands.  In the ED patient received Rocephin, Zithromax and 80 mg IV Lasix x1. The patient was also given Solu-Medrol. However as his clinical picture was clarified, the patient's furosemide and solumedrol were discontinued. The patient was continued on antibiotics. He showed clinical improvement. He defervescence. He remained hemodynamically stable. He was weaned off oxygen. Her pulse ox was negative for desaturation. The patient was discharged in stable condition.     Discharge Exam: Filed Vitals:   11/28/13 1351  BP: 120/45  Pulse: 66  Temp: 97.1 F (36.2 C)  Resp: 20   Filed Vitals:   11/27/13 2114 11/28/13 0547 11/28/13 1003 11/28/13 1351  BP: 125/63 110/48  120/45  Pulse: 67 65  66  Temp: 97.8 F (36.6 C) 97.8 F (36.6 C)  97.1 F (36.2 C)  TempSrc: Oral Oral  Oral  Resp: 20 20  20   Height:      Weight:  108.047 kg (238 lb 3.2 oz)    SpO2: 97% 98% 94% 95%   General: A&O x 3, NAD, pleasant, cooperative Cardiovascular: RRR, no rub, no gallop, no S3 Respiratory: Bibasilar crackles. Left clear to auscultation. No wheezing. Abdomen:soft, nontender, nondistended, positive  bowel sounds Extremities: 1+ LE edema, No lymphangitis, no petechiae  Discharge Instructions  Discharge Orders   Future Appointments Provider Department Dept Phone   02/24/2014 10:00 AM [facility] [place] [facility] [phone]   Future Orders Complete By Expires   Diet - low sodium heart healthy  As directed    Increase activity slowly  As directed        Medication List         acetaminophen 500 MG tablet  Commonly known as:  TYLENOL  Take 1,000 mg by mouth every 6 (six) hours as needed. For pain/headaches     ALPRAZolam 1 MG tablet  Commonly known as:  XANAX  Take 1 mg by mouth 4 (four) times daily as needed for anxiety.     amiodarone 200 MG tablet  Commonly known as:  PACERONE  Take 200 mg by mouth daily.     apixaban 5 MG Tabs tablet  Commonly known as:  ELIQUIS  Take 1 tablet (5 mg total) by mouth 2 (two) times daily.     aspirin 81 MG EC tablet  Take 81 mg by mouth daily.     atorvastatin 20 MG tablet  Commonly known as:  LIPITOR  Take 1 tablet (20 mg total) by mouth daily at 6 PM.     azithromycin 500 MG tablet  Commonly known as:  ZITHROMAX  Take 1 tablet (500 mg total) by mouth daily.  bismuth subsalicylate 562 ZH/08MV suspension  Commonly known as:  PEPTO BISMOL  Take 30 mLs by mouth every 6 (six) hours as needed for indigestion.     cefdinir 300 MG capsule  Commonly known as:  OMNICEF  Take 1 capsule (300 mg total) by mouth 2 (two) times daily.     docusate sodium 100 MG capsule  Commonly known as:  COLACE  Take 100 mg by mouth 2 (two) times daily.     fenofibrate 145 MG tablet  Commonly known as:  TRICOR  Take 145 mg by mouth daily.     gabapentin 300 MG capsule  Commonly known as:  NEURONTIN  Take 300 mg by mouth 3 (three) times daily.     insulin glargine 100 UNIT/ML injection  Commonly known as:  LANTUS  Inject 60-70 Units into the skin 2 (two) times daily. Takes70 units in the morning and 60 units in the evening     insulin  lispro 100 UNIT/ML injection  Commonly known as:  HUMALOG  Inject 35-45 Units into the skin 3 (three) times daily before meals. 45U bid and 35U QD     levothyroxine 25 MCG tablet  Commonly known as:  SYNTHROID, LEVOTHROID  Take 25 mcg by mouth daily.     metoprolol succinate 25 MG 24 hr tablet  Commonly known as:  TOPROL XL  Take 1 tablet (25 mg total) by mouth daily.     pantoprazole 40 MG tablet  Commonly known as:  PROTONIX  Take 40 mg by mouth 2 (two) times daily.     torsemide 10 MG tablet  Commonly known as:  DEMADEX  Take 1 tablet (10 mg total) by mouth daily.         The results of significant diagnostics from this hospitalization (including imaging, microbiology, ancillary and laboratory) are listed below for reference.    Significant Diagnostic Studies: Dg Chest 2 View  11/25/2013   CLINICAL DATA:  SHORTNESS OF BREATH  EXAM: CHEST  2 VIEW  COMPARISON:  DG CHEST 1V PORT dated 05/31/2013  FINDINGS: Low lung volumes. Cardiac silhouette is enlarged. Patient is status post median sternotomy coronary artery bypass grafting. Osseous structures unremarkable.  IMPRESSION: No active cardiopulmonary disease.   Electronically Signed   By: Margaree Mackintosh M.D.   On: 11/25/2013 14:54   Dg Chest Port 1 View  11/26/2013   CLINICAL DATA:  Shortness of breath, fever, hypertension, diabetes, coronary artery disease post CABG  EXAM: PORTABLE CHEST - 1 VIEW  COMPARISON:  Portable exam 1353 hr compared to 11/25/2013  FINDINGS: Enlargement of cardiac silhouette post median sternotomy and CABG.  Slight pulmonary vascular congestion.  Mediastinal contours normal.  Minimal bibasilar atelectasis.  No acute failure or consolidation.  No pleural effusion or pneumothorax.  IMPRESSION: Enlargement of cardiac silhouette with pulmonary vascular congestion post CABG.  Bibasilar atelectasis.   Electronically Signed   By: Lavonia Dana M.D.   On: 11/26/2013 15:19     Microbiology: Recent Results (from the  past 240 hour(s))  CULTURE, BLOOD (ROUTINE X 2)     Status: None   Collection Time    11/25/13  6:07 PM      Result Value Ref Range Status   Specimen Description BLOOD RIGHT HAND   Final   Special Requests     Final   Value: BOTTLES DRAWN AEROBIC AND ANAEROBIC AEB=8CC ANA=5CC   Culture NO GROWTH 3 DAYS   Final   Report Status PENDING   Incomplete  CULTURE, BLOOD (ROUTINE X 2)     Status: None   Collection Time    11/25/13  6:09 PM      Result Value Ref Range Status   Specimen Description BLOOD LEFT HAND   Final   Special Requests BOTTLES DRAWN AEROBIC ONLY 3CC   Final   Culture NO GROWTH 3 DAYS   Final   Report Status PENDING   Incomplete  MRSA PCR SCREENING     Status: None   Collection Time    11/25/13  8:40 PM      Result Value Ref Range Status   MRSA by PCR NEGATIVE  NEGATIVE Final   Comment:            The GeneXpert MRSA Assay (FDA     approved for NASAL specimens     only), is one component of a     comprehensive MRSA colonization     surveillance program. It is not     intended to diagnose MRSA     infection nor to guide or     monitor treatment for     MRSA infections.     Labs: Basic Metabolic Panel:  Recent Labs Lab 11/25/13 1618 11/26/13 0301 11/27/13 0542 11/28/13 0533  NA 141 134* 140 140  K 4.3 5.1 4.7 4.7  CL 101 94* 99 100  CO2 27 22 32 31  GLUCOSE 90 327* 280* 125*  BUN 17 26* 44* 36*  CREATININE 1.50* 1.80* 1.61* 1.37*  CALCIUM 9.2 8.8 9.1 9.0   Liver Function Tests:  Recent Labs Lab 11/26/13 0301  AST 19  ALT 20  ALKPHOS 42  BILITOT 0.5  PROT 7.0  ALBUMIN 3.4*    Recent Labs Lab 11/26/13 0935  LIPASE 23   No results found for this basename: AMMONIA,  in the last 168 hours CBC:  Recent Labs Lab 11/25/13 1618 11/26/13 0301 11/27/13 0542 11/28/13 0533  WBC 14.6* 15.6* 12.0* 9.3  NEUTROABS 11.1*  --   --   --   HGB 10.9* 10.9* 9.9* 9.9*  HCT 33.5* 34.2* 31.2* 31.7*  MCV 79.2 79.7 79.8 80.1  PLT 196 168 195 219    Cardiac Enzymes:  Recent Labs Lab 11/25/13 1618 11/25/13 2105 11/26/13 0301 11/26/13 0935  TROPONINI <0.30 <0.30 <0.30 <0.30   BNP: No components found with this basename: POCBNP,  CBG:  Recent Labs Lab 11/27/13 1132 11/27/13 1637 11/27/13 2112 11/28/13 0749 11/28/13 1122  GLUCAP 265* 249* 293* 137* 171*    Time coordinating discharge:  Greater than 30 minutes  Signed:  Orson Eva, DO [facility] Pager: [phone] 11/28/2013, 4:02 PM

## 2013-11-28 NOTE — Evaluation (Signed)
Physical Therapy Evaluation Patient Details Name: [name] MRN: [id number] DOB: [birth year] Today's Date: 11/28/2013   History of Present Illness  Pt was  admitted with hypoxia secondary to pneumonia.  He has significant cardiac disease and is s/p CVA with right hemiparesis.  He lives with his sister and is independent with a cane.  Clinical Impression   Pt was seen for eval.  He was alert and oriented, very pleasant and cooperative.  He was on 3 L O2 upon my arrival with O2 sat=97%.  O2 was decreased to 2 L/min with sat=96%.  He has decreased strength in right extremeties due to a stroke in 2003.  Strength is currently at baseline.  O2 was removed for gait and he was able to ambulate 200' with a cane and good stability, no dyspnea.  His O2 sat after gait=94%.  He was left off of supplemental O2 and this was reported to RN student caring for him today.    Follow Up Recommendations No PT follow up    Equipment Recommendations  None recommended by PT    Recommendations for Other Services   none    Precautions / Restrictions Precautions Precautions: Fall Required Braces or Orthoses:  (pt has a right AFO) Restrictions Weight Bearing Restrictions: No      Mobility  Bed Mobility Overal bed mobility: Independent                Transfers Overall transfer level: Independent Equipment used: None                Ambulation/Gait Ambulation/Gait assistance: Modified independent (Device/Increase time) Ambulation Distance (Feet): 200 Feet Assistive device: Straight cane Gait Pattern/deviations: Decreased stance time - right   Gait velocity interpretation: at or above normal speed for age/gender General Gait Details: pt encouraged to use a cane for all gait  Stairs:  N/A                   Balance Overall balance assessment: Modified Independent                                             Home Living Family/patient expects to be  discharged to:: Private residence Living Arrangements: Other relatives (sister) Available Help at Discharge: Family;Available 24 hours/day Type of Home: House Home Access: Ramped entrance     Home Layout: One level Home Equipment: Cane - single point      Prior Function Level of Independence: Independent with assistive device(s)               Hand Dominance    right    Extremity/Trunk Assessment   Upper Extremity Assessment: RUE deficits/detail RUE Deficits / Details: limited function of RUE due to old stroke         Lower Extremity Assessment: RLE deficits/detail RLE Deficits / Details: strength 3/5 in hip and knee due to old stroke;  strength in ankle =2/5 and has a AFO at home       Communication   Communication: No difficulties  Cognition Arousal/Alertness: Awake/alert Behavior During Therapy: WFL for tasks assessed/performed Overall Cognitive Status: Within Functional Limits for tasks assessed                                      Assessment/Plan  PT Assessment Patent does not need any further PT services  PT Diagnosis     PT Problem List    PT Treatment Interventions     PT Goals (Current goals can be found in the Care Plan section) Acute Rehab PT Goals PT Goal Formulation: No goals set, d/c therapy          Barriers to discharge  none                     End of Session Equipment Utilized During Treatment: Gait belt Activity Tolerance: Patient tolerated treatment well Patient left: in chair;with call bell/phone within reach;with chair alarm set;with nursing/sitter in room Nurse Communication: Mobility status         Time: 5681-2751 PT Time Calculation (min): 31 min   Charges:   PT Evaluation $Initial PT Evaluation Tier I: 1 Procedure     PT G Codes:          Sable Feil 11/28/2013, 10:16 AM

## 2013-11-30 LAB — CULTURE, BLOOD (ROUTINE X 2)
CULTURE: NO GROWTH
Culture: NO GROWTH

## 2014-01-07 ENCOUNTER — Other Ambulatory Visit (HOSPITAL_COMMUNITY): Payer: Self-pay | Admitting: Cardiology

## 2014-01-07 NOTE — Telephone Encounter (Signed)
Rx was sent to pharmacy electronically. 

## 2014-01-13 ENCOUNTER — Other Ambulatory Visit: Payer: Self-pay | Admitting: Internal Medicine

## 2014-01-14 NOTE — Telephone Encounter (Signed)
Rx refill sent to patient pharmacy   

## 2014-01-25 ENCOUNTER — Encounter: Payer: Self-pay | Admitting: Internal Medicine

## 2014-01-25 ENCOUNTER — Telehealth: Payer: Self-pay | Admitting: Internal Medicine

## 2014-01-27 NOTE — Telephone Encounter (Signed)
Closed encounter °

## 2014-02-07 ENCOUNTER — Other Ambulatory Visit (HOSPITAL_COMMUNITY): Payer: Self-pay | Admitting: Cardiology

## 2014-02-24 ENCOUNTER — Ambulatory Visit (HOSPITAL_COMMUNITY): Payer: Medicare HMO | Attending: Endocrinology

## 2014-03-19 ENCOUNTER — Ambulatory Visit (HOSPITAL_COMMUNITY)
Admission: RE | Admit: 2014-03-19 | Discharge: 2014-03-19 | Disposition: A | Discharge status: Home / Self Care | Payer: Medicare HMO | Source: Ambulatory Visit | Attending: Endocrinology | Admitting: Endocrinology

## 2014-03-19 DIAGNOSIS — E042 Nontoxic multinodular goiter: Secondary | ICD-10-CM | POA: Insufficient documentation

## 2014-03-19 DIAGNOSIS — E049 Nontoxic goiter, unspecified: Secondary | ICD-10-CM

## 2014-04-06 ENCOUNTER — Other Ambulatory Visit: Payer: Self-pay | Admitting: Internal Medicine

## 2014-04-07 NOTE — Telephone Encounter (Signed)
Rx refill sent to patient pharmacy   

## 2014-04-10 ENCOUNTER — Ambulatory Visit: Payer: Medicare HMO | Admitting: Internal Medicine

## 2014-04-11 ENCOUNTER — Ambulatory Visit: Payer: Medicare HMO | Admitting: Internal Medicine

## 2014-04-14 ENCOUNTER — Ambulatory Visit (INDEPENDENT_AMBULATORY_CARE_PROVIDER_SITE_OTHER): Payer: Commercial Managed Care - HMO | Admitting: Internal Medicine

## 2014-04-14 ENCOUNTER — Encounter: Payer: Self-pay | Admitting: Internal Medicine

## 2014-04-14 VITALS — BP 134/76 | HR 57 | Ht 70.5 in | Wt 251.3 lb

## 2014-04-14 DIAGNOSIS — I255 Ischemic cardiomyopathy: Secondary | ICD-10-CM

## 2014-04-14 DIAGNOSIS — E669 Obesity, unspecified: Secondary | ICD-10-CM

## 2014-04-14 DIAGNOSIS — Z952 Presence of prosthetic heart valve: Secondary | ICD-10-CM

## 2014-04-14 DIAGNOSIS — Z951 Presence of aortocoronary bypass graft: Secondary | ICD-10-CM

## 2014-04-14 DIAGNOSIS — I447 Left bundle-branch block, unspecified: Secondary | ICD-10-CM

## 2014-04-14 DIAGNOSIS — Z954 Presence of other heart-valve replacement: Secondary | ICD-10-CM

## 2014-04-14 DIAGNOSIS — I4891 Unspecified atrial fibrillation: Secondary | ICD-10-CM

## 2014-04-14 DIAGNOSIS — I2589 Other forms of chronic ischemic heart disease: Secondary | ICD-10-CM

## 2014-04-14 NOTE — Patient Instructions (Signed)
Dr.Hilty wants you to follow-up in: SIX MONTHS. You will receive a reminder letter in the mail two months in advance. If you don't receive a letter, please call our office to schedule the follow-up appointment.  

## 2014-04-14 NOTE — Progress Notes (Signed)
OFFICE NOTE  Chief Complaint:  Routine followup  Primary Care Physician: [name]., MD  HPI:  [name] a pleasant 68 year old gentleman with history of coronary artery disease status post CABG x3 vessels with Dr. Prescott Gum with a LIMA to the LAD, SVG to circumflex and SVG to PDA. He also had an [facility] Ease bioprosthetic 23-mm valve placed at that time in 2010. He is seen today in followup from the hospital for TCM 7 due to the high risk of admission.  He did receive a phone call after discharge within 24 hours which found him well, continuing to urinate excessively with ongoing weight loss and improved dyspnea.  His history goes back to 2003, unfortunately,when he had a stroke which has caused much difficulty walking as well as some heaviness and some swelling in the right upper extremity particularly. Unfortunately, he has not been able to lose weight. He is active walking but does not necessarily do any exercise. In February 2013 he underwent another echocardiogram to look at his valve gradient. It was increased significantly compared to his prior study. The peak gradient was 26, mean gradient of 14. However, he was not complaining of any worsening dyspnea or anything associated with that. He is describing some symptoms which are concerning for worsening acid reflux including belching and increased heartburn. At his last office visit I increased his protonix to twice daily. This is improved his reflux some, but he still has symptoms associated with some belching and/or flatus which improves. Recently he was admitted to the hospital for atrial fibrillation with rapid ventricular response which was noted when he showed up at his doctor's office after having 1-2 loose stools but no bleeding, no abdominal pain. He did have a couple of episodes of dry heaves and 1-2 episodes of small volume non-bloody vomiting. He reported no significant change with his po intake however. Over  the last day his bowel habits have returned to baseline and he has not had any further nausea or vomiting. He saw his PCP's office 05/31/13 due to SOB and palpitations and after an ECG showed tachyarrhythmia they called 911 and an ambulance brought him to the [facility] ED. On arrival his ECG showed a wide-complex rhythm at 157 bpm and telemetry showed irregularly irregular rhythm. Historical ECG's show NSR with LBBB pattern thus the LBBB was not new. In the ED, physician staff concluded that he had new onset AF and started a Diltiazem infusion with some improvement in the HR but significant irregularity and variance in his HR continued. IV amiodarone was started and coreg changed to lopressor. ACE was held. Lasix given. He eventually changed to atrial flutter and then spontaneously converted to sinus rhythm. Pt hyperkalemic and kayexalate given. Plan for anticoagulation after rt and lt heart cath to eval for ischemic cause of atrial fib. EF by echo no 25-30%. He also has grade 3 diastolic dysfunction.   Pt continued to improve and underwent cardiac cath:   Impression:  1. Native 3 vessel CAD.  2. Patent LIMA to LAD, SVG to OM and SVG to PDA - TIMI III flow  3. Elevated right and left heart filling pressures, with predominantly pulmonary venous hypertension, normal PVR  4. Mildly reduced cardiac output and index   He returns feeling well today. He has had stable weights at home. Fortunately had a recent echo on 09/09/2013 which shows an improved EF of 45-50%, which was previously 30%. There is still dietary noncompliance  and blood sugars are not well controlled ranging in the 200s in the morning.  PMHx:  Past Medical History  Diagnosis Date  . Hypertension   . Diabetes mellitus     type 2   . History of valve replacement 2010    [facility] Ease bioprosthetic 48mm  . Thyroid disease   . Stroke 2003    R-sided weakness & upper extremity swelling   . Coronary artery disease   . Dyslipidemia     . At risk for sudden cardiac death 22-Jun-2013  . PAF (paroxysmal atrial fibrillation) 05/2013    converted with amiodarone to SR  . Chronic anticoagulation 05/2013    started  . S/P cardiac catheterization, Rt & Lt heart cath 06/05/2013 06-22-2013  . S/P CABG x 3 2010  . GERD (gastroesophageal reflux disease)     Past Surgical History  Procedure Laterality Date  . Coronary artery bypass graft  01/26/2009    LIMA to LAD, SVG to circumflex, SVG to PDA  . Aortic valve replacement  01/26/2009    [facility] Ease bioprosthetic 9mm valve  . Transthoracic echocardiogram  09/2011    grade 1 diastolic dysfunction; increasing valve gradient; calcified MV annulus   . Cardiac catheterization  06/05/2013    native 3 vessel disease; patent LIMA to LAD, SVG to OM and SVG to PDA; Elevated right and left heart filling pressures, with predominantly pulmonary venous hypertension, normal PVR  . Transthoracic echocardiogram  06/03/2013    EF 25-30%, mod conc. hypertrophy, grade 3 diastolic dysfunction; LA mod dilated; calcified MV annulus; transaortic gradients are normal for the bioprosthetic valve; inf vena cava dilated (elevated CVP) - LifeVest    FAMHx:  Family History  Problem Relation Age of Onset  . Heart attack Mother   . Liver disease Brother   . Diabetes Sister     x4    SOCHx:   reports that he quit smoking about 12 years ago. His smoking use included Cigarettes. He smoked 0.00 packs per day. He has quit using smokeless tobacco. His smokeless tobacco use included Chew. He reports that he does not drink alcohol or use illicit drugs.  ALLERGIES:  No Known Allergies  ROS: A comprehensive review of systems was negative.  HOME MEDS: Current Outpatient Prescriptions  Medication Sig Dispense Refill  . acetaminophen (TYLENOL) 500 MG tablet Take 1,000 mg by mouth every 6 (six) hours as needed. For pain/headaches       . ALPRAZolam (XANAX) 1 MG tablet Take 1 mg by mouth 4 (four) times  daily as needed for anxiety.       Marland Kitchen amiodarone (PACERONE) 200 MG tablet Take 200 mg by mouth daily.       Marland Kitchen aspirin 81 MG EC tablet Take 81 mg by mouth daily.        Marland Kitchen atorvastatin (LIPITOR) 20 MG tablet TAKE ONE TABLET BY MOUTH DAILY AT 6:00PM  30 tablet  9  . bismuth subsalicylate (PEPTO BISMOL) 262 MG/15ML suspension Take 30 mLs by mouth every 6 (six) hours as needed for indigestion.      . celecoxib (CELEBREX) 200 MG capsule Take 1 capsule by mouth 2 (two) times daily.      Marland Kitchen docusate sodium (COLACE) 100 MG capsule Take 100 mg by mouth 2 (two) times daily.        Marland Kitchen ELIQUIS 5 MG TABS tablet TAKE ONE TABLET BY MOUTH TWICE A DAY  60 tablet  5  . fenofibrate (TRICOR) 145 MG  tablet Take 145 mg by mouth daily.      Marland Kitchen gabapentin (NEURONTIN) 300 MG capsule Take 300 mg by mouth 2 (two) times daily.       Marland Kitchen HYDROcodone-acetaminophen (NORCO) 10-325 MG per tablet as needed.      . insulin glargine (LANTUS) 100 UNIT/ML injection Inject 60-70 Units into the skin 2 (two) times daily. Takes70 units in the morning and 60 units in the evening      . insulin lispro (HUMALOG) 100 UNIT/ML injection Inject 50-60 Units into the skin 3 (three) times daily before meals. 50U BID and 60U QHS      . levothyroxine (SYNTHROID, LEVOTHROID) 25 MCG tablet Take 25 mcg by mouth daily.        . metoprolol succinate (TOPROL-XL) 25 MG 24 hr tablet TAKE ONE TABLET ONCE DAILY  30 tablet  5  . Omega-3 Fatty Acids (FISH OIL PO) Take 2 capsules by mouth daily.      . pantoprazole (PROTONIX) 40 MG tablet Take 40 mg by mouth 2 (two) times daily.      Marland Kitchen torsemide (DEMADEX) 10 MG tablet TAKE ONE (1) TABLET BY MOUTH EVERY DAY  30 tablet  9   No current facility-administered medications for this visit.    LABS/IMAGING: No results found for this or any previous visit (from the past 48 hour(s)). No results found.  VITALS: BP 134/76  Pulse 57  Ht 5' 10.5" (1.791 m)  Wt 251 lb 4.8 oz (113.989 kg)  BMI 35.54 kg/m2  EXAM: General  appearance: alert and no distress, wearing lifevest Neck: no carotid bruit, no JVD Lungs: clear to auscultation bilaterally Heart: regular rate and rhythm, S1, S2 normal, 2/6 SEM at RUSB, no click, rub or gallop Abdomen: soft, non-tender; bowel sounds normal; no masses,  no organomegaly and obese Extremities: extremities normal, atraumatic, no cyanosis or edema Pulses: 2+ and symmetric Skin: Skin color, texture, turgor normal. No rashes or lesions Neurologic: Grossly normal  EKG: Sinus bradycardia at 57, left bundle branch block, first degree AV block  ASSESSMENT: 1. Coronary artery disease status post three-vessel CABG in 2010 2. Status post bioprosthetic aVR in 2010, [facility] MagnaEase valve- 23 mm, gradients of 26/14 mmHg 3. GERD - on protoix 4. Dyslipidemia 5. Diabetes type 2 6. History of stroke with right-sided hemiparesis - complaining of numbness and tingling in his 4th/5th digits on the right hand 7. Atrial fibrillation with rapid ventricular response-spontaneously converted with amiodarone 8. Acute on chronic systolic congestive heart failure-EF now 45-50%, NYHA Class II symptoms 9. Left bundle-branch block-QRS duration 158 msec  PLAN: 1.   Mr. Bonito is doing much better.  His ejection fraction has come back up to 45-50%, therefore he no longer needs to be considered for an AICD. Weight has been stable within 1-2 pounds and he continues to urinate well with torsemide. There no signs of decompensated heart failure today. He continues to take Eliquis without any significant bleeding problems. He has not been hospitalized for heart failure in the past 6 months. We will continue his current medications today and plan to see him back in 6 months.  Pixie Casino, MD, [facility] Attending Cardiologist [facility] 04/14/2014, 11:35 AM

## 2014-05-08 ENCOUNTER — Other Ambulatory Visit: Payer: Self-pay | Admitting: Internal Medicine

## 2014-05-08 NOTE — Telephone Encounter (Signed)
Rx was sent to pharmacy electronically. 

## 2014-05-30 ENCOUNTER — Telehealth: Payer: Self-pay | Admitting: *Deleted

## 2014-05-30 NOTE — Telephone Encounter (Signed)
Faxed clearance to hold xarelto for 5 days prior to surgery to remove skin lesions, and restart after.

## 2014-07-07 ENCOUNTER — Other Ambulatory Visit: Payer: Self-pay | Admitting: Cardiology

## 2014-07-07 NOTE — Telephone Encounter (Signed)
Rx was sent to pharmacy electronically. 

## 2014-07-24 ENCOUNTER — Encounter (HOSPITAL_COMMUNITY): Payer: Self-pay | Admitting: Cardiology

## 2014-09-06 ENCOUNTER — Other Ambulatory Visit: Payer: Self-pay | Admitting: Cardiovascular Disease

## 2014-10-03 ENCOUNTER — Other Ambulatory Visit: Payer: Self-pay | Admitting: Internal Medicine

## 2014-10-03 NOTE — Telephone Encounter (Signed)
Rx refill sent to patient pharmacy   

## 2014-10-16 ENCOUNTER — Other Ambulatory Visit: Payer: Self-pay

## 2014-10-16 MED ORDER — METOPROLOL SUCCINATE ER 25 MG PO TB24: 25.0000 mg | ORAL_TABLET | Freq: Every day | ORAL | Status: DC

## 2014-10-16 MED ORDER — ATORVASTATIN CALCIUM 20 MG PO TABS: 20.0000 mg | ORAL_TABLET | Freq: Every day | ORAL | Status: DC

## 2014-10-16 MED ORDER — PANTOPRAZOLE SODIUM 40 MG PO TBEC: 40.0000 mg | DELAYED_RELEASE_TABLET | Freq: Two times a day (BID) | ORAL | Status: DC

## 2014-10-16 MED ORDER — APIXABAN 5 MG PO TABS: 5.0000 mg | ORAL_TABLET | Freq: Two times a day (BID) | ORAL | Status: DC

## 2014-10-16 MED ORDER — AMIODARONE HCL 200 MG PO TABS: 200.0000 mg | ORAL_TABLET | Freq: Every day | ORAL | Status: DC

## 2014-10-16 MED ORDER — FENOFIBRATE 145 MG PO TABS: 145.0000 mg | ORAL_TABLET | Freq: Every day | ORAL | Status: DC

## 2014-10-16 NOTE — Telephone Encounter (Signed)
Rx(s) sent to pharmacy electronically.  

## 2014-10-30 ENCOUNTER — Other Ambulatory Visit: Payer: Self-pay

## 2014-10-30 MED ORDER — FENOFIBRATE 145 MG PO TABS: 145.0000 mg | ORAL_TABLET | Freq: Every day | ORAL | Status: DC

## 2014-10-30 MED ORDER — PANTOPRAZOLE SODIUM 40 MG PO TBEC: 40.0000 mg | DELAYED_RELEASE_TABLET | Freq: Two times a day (BID) | ORAL | Status: AC

## 2014-10-30 MED ORDER — ATORVASTATIN CALCIUM 20 MG PO TABS
20.0000 mg | ORAL_TABLET | Freq: Every day | ORAL | Status: DC
Start: 2014-10-30 — End: 2016-06-16

## 2014-10-30 MED ORDER — APIXABAN 5 MG PO TABS: 5.0000 mg | ORAL_TABLET | Freq: Two times a day (BID) | ORAL | Status: DC

## 2014-10-30 MED ORDER — METOPROLOL SUCCINATE ER 25 MG PO TB24: 25.0000 mg | ORAL_TABLET | Freq: Every day | ORAL | Status: DC

## 2014-10-30 MED ORDER — AMIODARONE HCL 200 MG PO TABS: 200.0000 mg | ORAL_TABLET | Freq: Every day | ORAL | Status: DC

## 2014-10-30 NOTE — Telephone Encounter (Signed)
Rx(s) sent to pharmacy electronically.  

## 2014-11-06 ENCOUNTER — Other Ambulatory Visit: Payer: Self-pay | Admitting: Internal Medicine

## 2014-12-10 ENCOUNTER — Other Ambulatory Visit (HOSPITAL_COMMUNITY): Payer: Self-pay | Admitting: Podiatry

## 2014-12-10 DIAGNOSIS — I739 Peripheral vascular disease, unspecified: Secondary | ICD-10-CM

## 2014-12-12 ENCOUNTER — Ambulatory Visit (HOSPITAL_COMMUNITY): Admission: RE | Admit: 2014-12-12 | Payer: Commercial Managed Care - HMO | Source: Ambulatory Visit

## 2015-01-21 DIAGNOSIS — G894 Chronic pain syndrome: Secondary | ICD-10-CM | POA: Diagnosis not present

## 2015-01-21 DIAGNOSIS — L089 Local infection of the skin and subcutaneous tissue, unspecified: Secondary | ICD-10-CM | POA: Diagnosis not present

## 2015-01-21 DIAGNOSIS — E1129 Type 2 diabetes mellitus with other diabetic kidney complication: Secondary | ICD-10-CM | POA: Diagnosis not present

## 2015-01-23 ENCOUNTER — Other Ambulatory Visit (HOSPITAL_COMMUNITY): Payer: Self-pay | Admitting: Physician Assistant

## 2015-01-23 ENCOUNTER — Ambulatory Visit (HOSPITAL_COMMUNITY)
Admission: RE | Admit: 2015-01-23 | Discharge: 2015-01-23 | Disposition: A | Discharge status: Home / Self Care | Payer: Medicare Other | Source: Ambulatory Visit | Attending: Physician Assistant | Admitting: Physician Assistant

## 2015-01-23 DIAGNOSIS — L089 Local infection of the skin and subcutaneous tissue, unspecified: Secondary | ICD-10-CM | POA: Insufficient documentation

## 2015-01-23 DIAGNOSIS — Z87891 Personal history of nicotine dependence: Secondary | ICD-10-CM | POA: Insufficient documentation

## 2015-01-23 DIAGNOSIS — E1129 Type 2 diabetes mellitus with other diabetic kidney complication: Secondary | ICD-10-CM

## 2015-01-23 DIAGNOSIS — Z794 Long term (current) use of insulin: Secondary | ICD-10-CM | POA: Insufficient documentation

## 2015-01-26 DIAGNOSIS — L89893 Pressure ulcer of other site, stage 3: Secondary | ICD-10-CM | POA: Diagnosis not present

## 2015-01-26 DIAGNOSIS — M79671 Pain in right foot: Secondary | ICD-10-CM | POA: Diagnosis not present

## 2015-02-02 NOTE — Patient Outreach (Signed)
[name] [facility]) Care Management  02/02/2015  [name] [birth year] [id number]   Referral received from MD and assigned to Deanne Coffer, [facility] for patient outreach.   Negan Grudzien L. [facility] Care Management Assistant [phone]

## 2015-02-03 ENCOUNTER — Other Ambulatory Visit (HOSPITAL_COMMUNITY): Payer: Commercial Managed Care - HMO

## 2015-02-04 ENCOUNTER — Other Ambulatory Visit: Payer: Self-pay | Admitting: Pharmacist

## 2015-02-04 NOTE — Patient Outreach (Signed)
Received pharmacy referral for this patient that patient needs assistance with his medications and that he does not read or write. Called and spoke with [name]. [name] agreed to meet with me in his home on 02/11/15 at [place], PharmD Clinical Pharmacist [place] Management [phone]

## 2015-02-11 ENCOUNTER — Ambulatory Visit: Payer: Medicare Other | Admitting: Pharmacist

## 2015-02-11 ENCOUNTER — Other Ambulatory Visit: Payer: Self-pay | Admitting: Pharmacist

## 2015-02-11 VITALS — BP 144/73 | HR 62

## 2015-02-11 DIAGNOSIS — E119 Type 2 diabetes mellitus without complications: Secondary | ICD-10-CM

## 2015-02-11 NOTE — Patient Outreach (Signed)
[facility]) Care Management  [place]   02/11/2015  [name] March 14, 1946 [id number]  Subjective: [name] is a 69 year old male referred to pharmacy by PCP Rowan Blase for assistance with managing his medications as he does not read or write. Met with [name] in his home today.   Explained [facility] services and read through the [facility] welcome packet and consent form with the patient. Patient completed the consent form. Patient reports that he would be interested in having a Nurse Care Manager come to see him as well.   Reports that he has only had one fall in the past year. However, states that he does have occasional dizzy spells, maybe once a month. Reports that he is not sure why he becomes dizzy. Patient does report some difficulty with balance and gait as a result of his past stroke. Reports that this dizziness is not positionally dependent and does not occur at a particular time of the day. Asked [name] if he has ever checked his blood sugar to see if he is having low blood sugar. Reports that this dizziness is like what he experiences when he has a low blood sugar, but that he has not checked.  Reports that his blood glucose was 124 mg/dL fasting this morning. Reports that it was 210 mg/dL before lunch today. Reports that his blood glucose has been running from 88 to just over 200 mg/dL. Reports that he feels low when his blood sugar gets into the 70s. Discussed how to treat low blood sugars. Advised patient that when he feels dizzy, to sit down and, when he is able, to check his blood sugar and then to treat it as we discussed if low.  Asked [name] about his mood. Reports that he gets down, feeling lonely and sad about his physical limitations after his stroke. Reports that when he gets sad he cries and talks to his sisters, [name] and [name]. Reports that [name] lives nearby. Reports that he gets sad that his children do not come to see him. Reports  that his sister that he was closest to passed away last year. Reports that he is not interested in speaking to a therapist or psychiatrist about these feelings.  Reviewed medications with patient including indications, dosing and administration. Patient reports/demonstrates that while he is unable to read, he does recognize some of the directions on his bottles and does recognize his medications by their appearances. While discussing the indication of each medication with the patient, I applied stickers with icons representing each indication to the bottom of each bottle to help [name] to remember what each one is for. [name] reports that he will move these stickers to the new bottle when he gets refills. [name] has two weekly pillboxes, one for morning and one for evening, which he reports that he fills himself. Shows me labeled bags of insulin syringes that he has in the fridge that have been prefilled with his Lantus and Humalog by his niece, [name]. Discussed insulin administration. Counseled patient to remove each syringe from the fridge and allow it to come to room temperature before injecting it to reduce pain. Reports that he is [name] longer taking aspirin. Reports that he had been taking pantoprazole twice daily for acid reflux, but that with his new prescription, the directions changed to once daily.  Discussed medications that can increase his risk of dizziness and falls, such as alprazolam, gabapentin and hydrocodone-acetaminophen. [name]  verbalizes understanding and states that he only uses these only as he needs them, alprazolam  to 1 tablet usually twice daily and gabapentin usually twice daily. Reports that he needs his Norco twice daily for his hip pain.  [name] has two bottles of atorvastatin of different manufacturers. Reports that he did not realize that this was the same medication and has been taking both. Helped [name] to combine these into one bottle.  Mr.  [name] has a refill of doxycycline that he picked up on 02/10/15. Reports that he was told to take this for 14 days for an infection of his toe and then to refill it only if his infection was not healed. Reports that he is concerned that he is going to have to have his toe removed. Upon discussion, determine that patient has only been taking this medication once daily, rather than twice daily as directed. Helped patient to fill it correctly for twice daily in his pillbox. Patient reports that his next PCP appointment is in a couple of weeks. Advised [name] to call his doctor's office in the morning to schedule a sooner appointment to have his toe looked at.   From looking at [name] pillbox, it appears that he missed two evening doses on Sunday and Monday. Reports that he thinks that he missed these because of church events. States that he normally does not miss any doses. However, reports that it is hard to remember. Asked [name] if he felt that an alarm would help. Patient states that this would be very helpful. However, patient reports that he is unable to afford to pay anything for this alarm. Provided [name] with an alarm and set this up for him, setting alarms for 7:30 AM and 5:30 PM, as instructed by the patient, and taught him how to use it.  During our conversation, a neighbor entered [name] home and began to speak with the patient and then sits at the table with [place]. I asked [name] if it was [name] for me to continue to discuss his medications with him in front of the neighbor. [name] states that this is fine. The neighbor eventually leaves the home, but then comes back in a few minutes later. After he leaves the second time, [name] comments that he wishes that the neighbor would stop bothering him. Due to this comment, I ask the patient again if he is Okay with [place] talking about his medications in front of this person. [name] states that the neighbor doesn't  need to know about his medications. I let [name] know that I only continued our discussion as he had stated that it was Okay, but that if his neighbor is to return, we will stop our discussion until he leaves.  Mr. Gabler reports that he has been receiving his medications from his local pharmacy. Reports that he does not have a copay until next year, as he paid his deductible. Counseled Mr. Minkin that if in the future he has copays or if he would like his medications to be delivered, [employer] is an option for convenience and possible cost savings. Provided Mr. Balke with the Mail Order phone number.  Patient asks about getting diabetic shoes. Reports that he previously received some, but that these have a metal brace on them that is uncomfortable. Reports that seeing the podiatrist was expensive. Asked about more affordable options.  Objective:   Current Medications: Current Outpatient Prescriptions  Medication Sig Dispense Refill  . acetaminophen (TYLENOL) 500 MG tablet Take 1,000 mg by mouth every 6 (six) hours as needed. For pain/headaches     . ALPRAZolam (XANAX) 1 MG tablet Take 1 mg by mouth 4 (four) times daily as needed for anxiety.     Marland Kitchen amiodarone (PACERONE) 200 MG tablet Take 1 tablet (200 mg total) by mouth daily. 90 tablet 1  . apixaban (ELIQUIS) 5 MG TABS tablet Take 1 tablet (5 mg total) by mouth 2 (two) times daily. 180 tablet 0  . aspirin 81 MG EC tablet Take 81 mg by mouth daily.      Marland Kitchen atorvastatin (LIPITOR) 20 MG tablet Take 1 tablet (20 mg total) by mouth daily at 6 PM. 90 tablet 1  . bismuth subsalicylate (PEPTO BISMOL) 262 MG/15ML suspension Take 30 mLs by mouth every 6 (six) hours as needed for indigestion.    . celecoxib (CELEBREX) 200 MG capsule Take 1 capsule by mouth 2 (two) times daily.    Marland Kitchen docusate sodium (COLACE) 100 MG capsule Take 100 mg by mouth 2 (two) times daily.      . fenofibrate (TRICOR) 145 MG tablet Take 1 tablet (145 mg  total) by mouth daily. 90 tablet 1  . gabapentin (NEURONTIN) 300 MG capsule Take 300 mg by mouth 2 (two) times daily.     Marland Kitchen HYDROcodone-acetaminophen (NORCO) 10-325 MG per tablet as needed.    . insulin glargine (LANTUS) 100 UNIT/ML injection Inject 60-70 Units into the skin 2 (two) times daily. Takes70 units in the morning and 60 units in the evening    . insulin lispro (HUMALOG) 100 UNIT/ML injection Inject 50-60 Units into the skin 3 (three) times daily before meals. 50U BID and 60U QHS    . levothyroxine (SYNTHROID, LEVOTHROID) 25 MCG tablet Take 25 mcg by mouth daily.      . metoprolol succinate (TOPROL-XL) 25 MG 24 hr tablet Take 1 tablet (25 mg total) by mouth daily. 90 tablet 1  . Omega-3 Fatty Acids (FISH OIL PO) Take 2 capsules by mouth daily.    . pantoprazole (PROTONIX) 40 MG tablet Take 1 tablet (40 mg total) by mouth 2 (two) times daily. 180 tablet 1  . torsemide (DEMADEX) 10 MG tablet TAKE ONE (1) TABLET EACH DAY 30 tablet 3   [name] current facility-administered medications for this visit.    Functional Status: [name] flowsheet data found.  Fall/Depression Screening: [name] flowsheet data found.  Assessment:  Mr. Fangman has an infection of his toe that has not resolved. Patient has been non-adherent with his antibiotic, as he was confused about the directions.   Mr. Wheeling has trouble remembering to take his medications.   Patient with hypertension, diabetes and coronary artery disease currently not on ACE inhibitor or ARB. Note patient previously on lisinopril, but held per 06/07/13 hospital discharge note, due to elevated serum creatinine levels.  Patient with coronary heart disease, status post CABG x3 vessels and stroke, and diabetes currently on a moderate, rather than high-intensity, statin. However, note that patient is also on amiodarone, which may increase concentrations of atorvastatin, putting patient at increased risk for muscle toxicity.  Plan: Patient to call his  PCP tomorrow morning to make an appointment to have his toe seen again. At this appointment, patient also to let PCP know that he has only been taking doxycycline once daily and ask about the change in his pantoprazole dose. Mr. Germer also to ask his PCP about referral to another  Podiatrist for new diabetic shoes.  Will place a referral for a Nurse Care Manager to meet with Mr. Sorbello per his request.  Will follow up with Mr. Pustejovsky on 02/13/15 to see if his insulin injections are more comfortable after letting the insulin warm, verify that he was able to make a follow up appointment about his toe and see if he has any questions about his new alarm.  Will follow up with Mr. Zenovia Jarred PCP, Collene Mares at [facility] to recommend reinitiating an ACE inhibitor. Will also request most recent lipid panel and, as appropriate, recommend increasing patient's atorvastatin dose for secondary prevention of coronary heart disease, but with follow up monitoring for signs of muscle toxicity.   Harlow Asa, PharmD Clinical Pharmacist [place] Management [phone]

## 2015-02-13 ENCOUNTER — Other Ambulatory Visit: Payer: Self-pay | Admitting: Pharmacist

## 2015-02-13 ENCOUNTER — Other Ambulatory Visit: Payer: Self-pay | Admitting: *Deleted

## 2015-02-13 ENCOUNTER — Encounter: Payer: Self-pay | Admitting: Pharmacist

## 2015-02-13 NOTE — Patient Outreach (Signed)
[facility]) Care Management  02/13/2015  [name] [birth year] [id number]   Request from Harlow Asa, PharmD to assign Community [name], assigned Rickey Amor, [name].  [name]. [place], [place] Management [place] Assistant Phone: [phone] Fax: [phone]

## 2015-02-13 NOTE — Patient Outreach (Signed)
Call to patient in regard to [facility] RN referral. Call to both phones, No answer for mobile and unable to leave message, Home line is busy. Plan to continue to reach out to discuss referral and schedule initial outreach visit. Royetta Crochet. Laymond Purser, RN, BSN, [place] [phone]

## 2015-02-13 NOTE — Patient Outreach (Signed)
Called to follow up with [name]. He reports that he did call his PCP's office to see if he could come in sooner to see his PCP, Rowan Blase, to look at this toe and how it's healing. However, reports that when he called the office he was instructed that Rowan Blase will be out of the office on vacation and that the soonest that he can be seen is at his original appointment time on Thursday, July 21st. Patient reports that he has been taking his doxycycline twice daily now for the past two days, applying his SSD cream and that the toe is looking better, "like it is healing slowly".   [name] reports that his insulin injections are more comfortable after letting the insulin warm. Reports that the medication alarm has been going off and has been helpful to him for remembering to take his medications.   Discussed with patient the services of [facility] through [place], as they provide prefilled blister packs and deliver. [name] states that he is not interested in this option at this time as he has had a negative experience with [facility] in the past and likes the service that he currently receives at [facility].  [name] states that he has no further questions for me at this time. Let him know that I would follow up with him again on Friday, 03/06/15 following his PCP visit.  Harlow Asa, PharmD Clinical Pharmacist [facility] [phone]

## 2015-02-13 NOTE — Patient Outreach (Signed)
Called Mr. Mancil PCP's office to request the values of the patient's most recent lipid panel. Per Caryl Pina at the office, Mr. Leu most recent was taken 12/11/14 with values as follows:  HDL 20 mg/dL Triglycerides 470 mg/dL LDL and Total Cholesterol "triglycerides too high to calculate value"  Requested previous lipid panel results. However, per Caryl Pina, no further lipid panels taken for this patient. Requested that Caryl Pina also send these results by fax.  Will send patient's PCP, Collene Mares, a letter recommending a follow up lipid panel and consideration of increasing the patient's current atorvastatin dose for secondary prevention of coronary heart disease, but with follow up monitoring for signs of muscle toxicity.  Harlow Asa, PharmD Clinical Pharmacist [facility] [phone]

## 2015-02-17 ENCOUNTER — Other Ambulatory Visit: Payer: Self-pay | Admitting: *Deleted

## 2015-02-17 NOTE — Patient Outreach (Signed)
Call to patient in regard to referral to [place] [facility]. Spoke with patient regarding [facility] program and outreach visit. Scheduled appointment for initial referral for tomorrow. Royetta Crochet. Laymond Purser, RN, BSN, [place] [phone]

## 2015-02-18 ENCOUNTER — Other Ambulatory Visit (HOSPITAL_COMMUNITY): Payer: Self-pay | Admitting: General Surgery

## 2015-02-18 ENCOUNTER — Encounter: Payer: Self-pay | Admitting: *Deleted

## 2015-02-18 ENCOUNTER — Other Ambulatory Visit: Payer: Self-pay | Admitting: *Deleted

## 2015-02-18 DIAGNOSIS — E1152 Type 2 diabetes mellitus with diabetic peripheral angiopathy with gangrene: Secondary | ICD-10-CM

## 2015-02-18 DIAGNOSIS — M869 Osteomyelitis, unspecified: Secondary | ICD-10-CM

## 2015-02-18 DIAGNOSIS — R0989 Other specified symptoms and signs involving the circulatory and respiratory systems: Secondary | ICD-10-CM

## 2015-02-18 DIAGNOSIS — E10621 Type 1 diabetes mellitus with foot ulcer: Secondary | ICD-10-CM | POA: Diagnosis not present

## 2015-02-18 DIAGNOSIS — L97519 Non-pressure chronic ulcer of other part of right foot with unspecified severity: Secondary | ICD-10-CM | POA: Diagnosis not present

## 2015-02-18 NOTE — Patient Outreach (Signed)
[facility]) Care Management   02/18/2015  [name] June 05, 1946 [id number]  [name] is an 69 y.o. male  Subjective:  Patient reporting his CBG's have been elevated over past few days. Patient reports he has appointment this am with surgeon regarding right toe wound.  Objective:   BP 148/70 mmHg  Pulse 60  Resp 20  Ht 1.778 m (5\' 10" )  Wt 250 lb (113.399 kg)  BMI 35.87 kg/m2  SpO2 96% Review of Systems  Constitutional: Negative.   HENT: Negative.   Respiratory: Negative.   Cardiovascular: Negative.   Gastrointestinal: Negative.   Genitourinary: Negative.   Musculoskeletal: Negative.        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   Psychiatric/Behavioral: Positive for depression.    Physical Exam  Constitutional: He is oriented to person, place, and time. He appears well-developed.  GI: Soft. Bowel sounds are normal.  Musculoskeletal:  Right sided weakness  Neurological: He is alert and oriented to person, place, and time.  Skin: Skin is warm.  Right toe    Current Medications:   Current Outpatient Prescriptions  Medication Sig Dispense Refill  . acetaminophen (TYLENOL) 500 MG tablet Take 1,000 mg by mouth every 6 (six) hours as needed. For pain/headaches     . ALPRAZolam (XANAX) 1 MG tablet Take 1 mg by mouth 4 (four) times daily as needed for anxiety.     Marland Kitchen amiodarone (PACERONE) 200 MG tablet Take 1 tablet (200 mg total) by mouth daily. 90 tablet 1  . apixaban (ELIQUIS) 5 MG TABS tablet Take 1 tablet (5 mg total) by mouth 2 (two) times daily. 180 tablet 0  . atorvastatin (LIPITOR) 20 MG tablet Take 1 tablet (20 mg total) by mouth daily at 6 PM. 90 tablet 1  . bismuth subsalicylate (PEPTO BISMOL) 262 MG/15ML suspension Take 30 mLs by mouth every 6 (six) hours as needed for indigestion.    . celecoxib (CELEBREX) 200 MG capsule  Take 1 capsule by mouth 2 (two) times daily.    Marland Kitchen docusate sodium (COLACE) 100 MG capsule Take 100 mg by mouth 2 (two) times daily.      Marland Kitchen docusate sodium (COLACE) 50 MG capsule Take 50 mg by mouth 2 (two) times daily.    Marland Kitchen doxycycline (VIBRA-TABS) 100 MG tablet Take 100 mg by mouth 2 (two) times daily.    . fenofibrate (TRICOR) 145 MG tablet Take 1 tablet (145 mg total) by mouth daily. 90 tablet 1  . ferrous sulfate 325 (65 FE) MG EC tablet Take 325 mg by mouth every morning.    . gabapentin (NEURONTIN) 300 MG capsule Take 300 mg by mouth 2 (two) times daily.     Marland Kitchen HYDROcodone-acetaminophen (NORCO) 10-325 MG per tablet as needed.    . insulin glargine (LANTUS) 100 UNIT/ML injection Inject 60-70 Units into the skin 2 (two) times daily. Takes70 units in the morning and 60 units in the evening    . insulin lispro (HUMALOG) 100 UNIT/ML injection Inject 50-60 Units into the skin 3 (three) times daily before meals. 50U BID and 60U QHS    . levothyroxine (SYNTHROID, LEVOTHROID) 25 MCG tablet Take 25 mcg by mouth daily.      . metoprolol succinate (TOPROL-XL) 25 MG 24 hr tablet Take 1 tablet (25 mg total) by mouth daily. 90 tablet 1  .  Omega-3 Fatty Acids (FISH OIL PO) Take 2 capsules by mouth daily.    . pantoprazole (PROTONIX) 40 MG tablet Take 1 tablet (40 mg total) by mouth 2 (two) times daily. 180 tablet 1  . silver sulfADIAZINE (SILVADENE) 1 % cream Apply 1 application topically 4 (four) times daily.    Marland Kitchen torsemide (DEMADEX) 10 MG tablet TAKE ONE (1) TABLET EACH DAY 30 tablet 3  . aspirin 81 MG EC tablet Take 81 mg by mouth daily.       No current facility-administered medications for this visit.    Functional Status:   In your present state of health, do you have any difficulty performing the following activities: 02/18/2015 02/11/2015  Hearing? N Y  Vision? N N  Difficulty concentrating or making decisions? Y N  Walking or climbing stairs? N Y  Dressing or bathing? N N  Doing errands,  shopping? N N  Preparing Food and eating ? N N  Using the Toilet? N N  In the past six months, have you accidently leaked urine? N N  Do you have problems with loss of bowel control? N N  Managing your Medications? N N  Managing your Finances? N N  Housekeeping or managing your Housekeeping? [name]    Fall/Depression Screening:    PHQ 2/9 Scores 02/18/2015 02/11/2015  PHQ - 2 Score 2 2  PHQ- 9 Score 2 7    Assessment:   Patient has appointment with Surgeon this am, only able to complete part of assessment  Plan:  Visit again tomorrow, to complete assessment Stanton Kidney E. Laymond Purser, RN, BSN, [place] [phone]

## 2015-02-19 ENCOUNTER — Other Ambulatory Visit: Payer: Self-pay | Admitting: *Deleted

## 2015-02-19 ENCOUNTER — Ambulatory Visit (HOSPITAL_COMMUNITY)
Admission: RE | Admit: 2015-02-19 | Discharge: 2015-02-19 | Disposition: A | Discharge status: Home / Self Care | Payer: Medicare Other | Source: Ambulatory Visit | Attending: General Surgery | Admitting: General Surgery

## 2015-02-19 DIAGNOSIS — L97519 Non-pressure chronic ulcer of other part of right foot with unspecified severity: Secondary | ICD-10-CM | POA: Diagnosis not present

## 2015-02-19 DIAGNOSIS — E1152 Type 2 diabetes mellitus with diabetic peripheral angiopathy with gangrene: Secondary | ICD-10-CM

## 2015-02-19 DIAGNOSIS — E10621 Type 1 diabetes mellitus with foot ulcer: Secondary | ICD-10-CM | POA: Diagnosis not present

## 2015-02-19 DIAGNOSIS — R0989 Other specified symptoms and signs involving the circulatory and respiratory systems: Secondary | ICD-10-CM

## 2015-02-19 DIAGNOSIS — M869 Osteomyelitis, unspecified: Secondary | ICD-10-CM

## 2015-02-19 NOTE — Patient Outreach (Signed)
[name]) Care Management   02/19/2015  COLT MARTELLE [birth year] [id number]  NAVDEEP HALT is an 69 y.o. male   Goal is for foot wound to heal  Subjective:  Patient reports seeing surgeon yesterday, states he cleaned up wound.  He has to return today for further evaluation. The doctor reports he thinks he can heal wound and will not have to amputate his toe or partial foot, but patient has to follow up with surgeon today and also tomorrow. He also is set up for xrays this afternoon. Patient reports the doctor wants him to not wear closed in shoes at this time. Patient reports he is to keep foot elevated and return to MD today.   Patient niece dropped by and delivered insulin syringes she had filled for patient. Patient reports he is using the medication alarm reminder.   Patient had not put the 2nd dose of antibiotic in his medication box. He states "I won't have enough pills, I will run out", he agrees to put in second dose of antibiotic.     Objective: Patient sitting in chair, foot elevated on pillow elevated on kitchen chair. BP 120/60 mmHg  Pulse 60  Resp 20  SpO2 97%  CBG: 107   Review of Systems  Constitutional: Negative.   Respiratory: Negative.   Cardiovascular: Positive for leg swelling.  Gastrointestinal: Negative.     Physical Exam  Constitutional: He is oriented to person, place, and time. He appears well-developed.  Neck: Normal range of motion.  Cardiovascular: Normal rate.   GI: Soft. Bowel sounds are normal.  Musculoskeletal:  Right sided weakness  Neurological: He is alert and oriented to person, place, and time.  Skin: Skin is warm and dry.       Current Medications:   Current Outpatient Prescriptions  Medication Sig Dispense Refill  . acetaminophen (TYLENOL) 500 MG tablet Take 1,000 mg by mouth every 6 (six) hours as needed. For pain/headaches     . ALPRAZolam (XANAX) 1 MG tablet Take 1 mg by mouth 4 (four) times  daily as needed for anxiety.     Marland Kitchen amiodarone (PACERONE) 200 MG tablet Take 1 tablet (200 mg total) by mouth daily. 90 tablet 1  . apixaban (ELIQUIS) 5 MG TABS tablet Take 1 tablet (5 mg total) by mouth 2 (two) times daily. 180 tablet 0  . aspirin 81 MG EC tablet Take 81 mg by mouth daily.      Marland Kitchen atorvastatin (LIPITOR) 20 MG tablet Take 1 tablet (20 mg total) by mouth daily at 6 PM. 90 tablet 1  . bismuth subsalicylate (PEPTO BISMOL) 262 MG/15ML suspension Take 30 mLs by mouth every 6 (six) hours as needed for indigestion.    . celecoxib (CELEBREX) 200 MG capsule Take 1 capsule by mouth 2 (two) times daily.    Marland Kitchen docusate sodium (COLACE) 100 MG capsule Take 100 mg by mouth 2 (two) times daily.      Marland Kitchen docusate sodium (COLACE) 50 MG capsule Take 50 mg by mouth 2 (two) times daily.    Marland Kitchen doxycycline (VIBRA-TABS) 100 MG tablet Take 100 mg by mouth 2 (two) times daily.    . fenofibrate (TRICOR) 145 MG tablet Take 1 tablet (145 mg total) by mouth daily. 90 tablet 1  . ferrous sulfate 325 (65 FE) MG EC tablet Take 325 mg by mouth every morning.    . gabapentin (NEURONTIN) 300 MG capsule Take 300 mg by mouth 2 (two) times daily.     Marland Kitchen  HYDROcodone-acetaminophen (NORCO) 10-325 MG per tablet as needed.    . insulin glargine (LANTUS) 100 UNIT/ML injection Inject 60-70 Units into the skin 2 (two) times daily. Takes70 units in the morning and 60 units in the evening    . insulin lispro (HUMALOG) 100 UNIT/ML injection Inject 50-60 Units into the skin 3 (three) times daily before meals. 50U BID and 60U QHS    . levothyroxine (SYNTHROID, LEVOTHROID) 25 MCG tablet Take 25 mcg by mouth daily.      . metoprolol succinate (TOPROL-XL) 25 MG 24 hr tablet Take 1 tablet (25 mg total) by mouth daily. 90 tablet 1  . Omega-3 Fatty Acids (FISH OIL PO) Take 2 capsules by mouth daily.    . pantoprazole (PROTONIX) 40 MG tablet Take 1 tablet (40 mg total) by mouth 2 (two) times daily. 180 tablet 1  . silver sulfADIAZINE  (SILVADENE) 1 % cream Apply 1 application topically 4 (four) times daily.    Marland Kitchen torsemide (DEMADEX) 10 MG tablet TAKE ONE (1) TABLET EACH DAY 30 tablet 3   No current facility-administered medications for this visit.    Functional Status:   In your present state of health, do you have any difficulty performing the following activities: 02/18/2015 02/11/2015  Hearing? N Y  Vision? N N  Difficulty concentrating or making decisions? Y N  Walking or climbing stairs? N Y  Dressing or bathing? N N  Doing errands, shopping? N N  Preparing Food and eating ? N N  Using the Toilet? N N  In the past six months, have you accidently leaked urine? N N  Do you have problems with loss of bowel control? N N  Managing your Medications? N N  Managing your Finances? N N  Housekeeping or managing your Housekeeping? [name]    Fall/Depression Screening:    PHQ 2/9 Scores 02/18/2015 02/11/2015  PHQ - 2 Score 2 2  PHQ- 9 Score 2 7    Assessment:   Reviewed instructions from surgeon. Reviewed goals Education on wound healing and diet and keeping CBG's under control. Instructed on increasing protein intake for the next week or two to assist with healing.  Reviewed diet and CBG's  Instructed patient on antibiotic that it is twice a day and he needs to add a second tablet to evening box, also that he is to take until runs out of medication, there are no refills.  Educated patient on antibiotic and healing of wound, he verbalized understanding.  Instructed patient to attempt to find another person to mow yard if possible, since he cannot wear a closed in shoe, risk for further infection and injury  Reviewed upcoming appointments, patient has xray but also scheduled is carotid artery U/S, discussed that he may want to get to hospital early due to extra appointment, that this is separate from the xray of his foot.    Plan:  Will visit again later in month to f/u on appointments, medications and wound  healing. Forward visit and barrier letter to primary care physician.  Royetta Crochet. Laymond Purser, RN, BSN, [place] [phone]    [facility] CM Care Plan Problem One        Patient Outreach from 02/11/2015 in [place] Problem One  Patient needs follow up with his PCP   Care Plan for Problem One  Active   [facility] CM Short Term Goal #1 (0-30 days)  Patient to call his PCP tomorrow morning to  make an appointment to have his toe seen again. At this appointment, patient also to let PCP know that he has only been taking doxycycline once daily and ask about the change in his pantoprazole dose. Mr. Hagemeister also to ask his PCP about referral to another Podiatrist for new diabetic shoes.   [facility] CM Short Term Goal #1 Start Date  02/11/15   Interventions for Short Term Goal #1  Counseled patient about the importance of follow up as this infection is still present and about taking his antibiotic as directed    [facility] CM Care Plan Problem Two        Patient Outreach from 02/19/2015 in [place] Problem Two  Uncontrolled diabetes as evidenced by elevated CBGs, HGA1C and nonhealing wound.   Care Plan for Problem Two  Active   Interventions for Problem Two Long Term Goal   Using teachback method, instructed patient to keep appointments with surgeon and other appointments related to foot wound   [facility] Long Term Goal (31-90) days  Patient stated goal "I want my foot to heal", Patient foot will show improvement to wound over next 90 days   [facility] Long Term Goal Start Date  02/19/15   [facility] CM Short Term Goal #1 (0-30 days)  Patient will take antibiotic as directed over next 14 days   [facility] CM Short Term Goal #1 Start Date  02/19/15   Interventions for Short Term Goal #2   Using teachback method reviewed correct dosage of antibiotic   [facility] CM Short Term Goal #2 (0-30 days)  Patient will increase protein in his diet over next 14-21 days for wound healing.   [facility] CM  Short Term Goal #2 Start Date  02/19/15   Interventions for Short Term Goal #2  using teachback method reviewed foods high in protein    [facility] CM Short Term Goal #3 (0-30 days)  Patient CBG's will be under better control over next 30 days   [facility] CM Short Term Goal #3 Start Date  02/19/15   Interventions for Short Term Goal #3  Using teachback method, instructed patient on keeping CBG log in [facility] blue calendar, reviewed diet and some of the reasons CBG's may be elevated (patient has wound infection at this time) and educated on importance of controlling blood sugars

## 2015-02-20 ENCOUNTER — Encounter: Payer: Self-pay | Admitting: *Deleted

## 2015-02-20 ENCOUNTER — Other Ambulatory Visit: Payer: Self-pay | Admitting: *Deleted

## 2015-02-20 DIAGNOSIS — E10621 Type 1 diabetes mellitus with foot ulcer: Secondary | ICD-10-CM | POA: Diagnosis not present

## 2015-02-20 DIAGNOSIS — L97519 Non-pressure chronic ulcer of other part of right foot with unspecified severity: Secondary | ICD-10-CM | POA: Diagnosis not present

## 2015-02-21 DIAGNOSIS — E10621 Type 1 diabetes mellitus with foot ulcer: Secondary | ICD-10-CM | POA: Diagnosis not present

## 2015-02-21 DIAGNOSIS — L97519 Non-pressure chronic ulcer of other part of right foot with unspecified severity: Secondary | ICD-10-CM | POA: Diagnosis not present

## 2015-02-23 DIAGNOSIS — L97519 Non-pressure chronic ulcer of other part of right foot with unspecified severity: Secondary | ICD-10-CM | POA: Diagnosis not present

## 2015-02-23 DIAGNOSIS — E10621 Type 1 diabetes mellitus with foot ulcer: Secondary | ICD-10-CM | POA: Diagnosis not present

## 2015-02-24 ENCOUNTER — Other Ambulatory Visit (HOSPITAL_COMMUNITY): Payer: Self-pay | Admitting: General Surgery

## 2015-02-24 DIAGNOSIS — L97519 Non-pressure chronic ulcer of other part of right foot with unspecified severity: Secondary | ICD-10-CM | POA: Diagnosis not present

## 2015-02-24 DIAGNOSIS — E11621 Type 2 diabetes mellitus with foot ulcer: Secondary | ICD-10-CM

## 2015-02-24 DIAGNOSIS — L97509 Non-pressure chronic ulcer of other part of unspecified foot with unspecified severity: Principal | ICD-10-CM

## 2015-02-24 DIAGNOSIS — E10621 Type 1 diabetes mellitus with foot ulcer: Secondary | ICD-10-CM | POA: Diagnosis not present

## 2015-02-25 DIAGNOSIS — E10621 Type 1 diabetes mellitus with foot ulcer: Secondary | ICD-10-CM | POA: Diagnosis not present

## 2015-02-25 DIAGNOSIS — L97519 Non-pressure chronic ulcer of other part of right foot with unspecified severity: Secondary | ICD-10-CM | POA: Diagnosis not present

## 2015-02-26 DIAGNOSIS — E10621 Type 1 diabetes mellitus with foot ulcer: Secondary | ICD-10-CM | POA: Diagnosis not present

## 2015-02-26 DIAGNOSIS — L97519 Non-pressure chronic ulcer of other part of right foot with unspecified severity: Secondary | ICD-10-CM | POA: Diagnosis not present

## 2015-02-28 DIAGNOSIS — L97519 Non-pressure chronic ulcer of other part of right foot with unspecified severity: Secondary | ICD-10-CM | POA: Diagnosis not present

## 2015-02-28 DIAGNOSIS — E10621 Type 1 diabetes mellitus with foot ulcer: Secondary | ICD-10-CM | POA: Diagnosis not present

## 2015-03-02 ENCOUNTER — Ambulatory Visit (HOSPITAL_COMMUNITY)
Admission: RE | Admit: 2015-03-02 | Discharge: 2015-03-02 | Disposition: A | Discharge status: Home / Self Care | Payer: Medicare Other | Source: Ambulatory Visit | Attending: General Surgery | Admitting: General Surgery

## 2015-03-02 ENCOUNTER — Other Ambulatory Visit (HOSPITAL_COMMUNITY): Payer: Self-pay | Admitting: General Surgery

## 2015-03-02 DIAGNOSIS — E11621 Type 2 diabetes mellitus with foot ulcer: Secondary | ICD-10-CM

## 2015-03-02 DIAGNOSIS — L97509 Non-pressure chronic ulcer of other part of unspecified foot with unspecified severity: Secondary | ICD-10-CM

## 2015-03-02 DIAGNOSIS — I7789 Other specified disorders of arteries and arterioles: Secondary | ICD-10-CM | POA: Diagnosis not present

## 2015-03-02 DIAGNOSIS — L97519 Non-pressure chronic ulcer of other part of right foot with unspecified severity: Secondary | ICD-10-CM | POA: Insufficient documentation

## 2015-03-02 DIAGNOSIS — I739 Peripheral vascular disease, unspecified: Secondary | ICD-10-CM | POA: Diagnosis not present

## 2015-03-02 DIAGNOSIS — E10621 Type 1 diabetes mellitus with foot ulcer: Secondary | ICD-10-CM | POA: Diagnosis not present

## 2015-03-03 DIAGNOSIS — L97519 Non-pressure chronic ulcer of other part of right foot with unspecified severity: Secondary | ICD-10-CM | POA: Diagnosis not present

## 2015-03-03 DIAGNOSIS — E10621 Type 1 diabetes mellitus with foot ulcer: Secondary | ICD-10-CM | POA: Diagnosis not present

## 2015-03-04 ENCOUNTER — Other Ambulatory Visit: Payer: Self-pay | Admitting: General Surgery

## 2015-03-04 DIAGNOSIS — E10621 Type 1 diabetes mellitus with foot ulcer: Secondary | ICD-10-CM | POA: Diagnosis not present

## 2015-03-04 DIAGNOSIS — L97519 Non-pressure chronic ulcer of other part of right foot with unspecified severity: Secondary | ICD-10-CM

## 2015-03-05 ENCOUNTER — Encounter: Payer: Self-pay | Admitting: *Deleted

## 2015-03-05 ENCOUNTER — Other Ambulatory Visit: Payer: Self-pay | Admitting: *Deleted

## 2015-03-05 DIAGNOSIS — L97519 Non-pressure chronic ulcer of other part of right foot with unspecified severity: Secondary | ICD-10-CM | POA: Diagnosis not present

## 2015-03-05 DIAGNOSIS — E10621 Type 1 diabetes mellitus with foot ulcer: Secondary | ICD-10-CM | POA: Diagnosis not present

## 2015-03-05 NOTE — Patient Outreach (Signed)
[facility]) Care Management   03/05/2015  [name] 07/13/1946 [id number]  [name] is an 69 y.o. male  Subjective:  Patient reports that he is going to Dr. Romona Curls daily for dressing change. He will have a stent placement in his right leg to see if they can get more blood flow to get his right toe wound healed quicker so he will not have to have an amputation. He states the wound is healing, but slow. Procedure scheduled for 03/18/15 He has finished his antibiotics. 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   Patient stating his blood sugars are better, he is eating more protein.  Patient states he is unable to write down his numbers because he cannot write but he can take his CBG machine to his appointments.   Patient to get eye exam next month.  Objective:   BP 128/70 mmHg  Pulse 60  Resp 20  Wt 248 lb (112.492 kg)  SpO2 97% Review of Systems  Constitutional: Negative.   HENT: Negative.   Eyes: Negative.   Respiratory: Negative.   Cardiovascular: Negative.   Genitourinary: Negative.   Musculoskeletal: Negative.   Neurological: Positive for sensory change.       Decreased feeling in feet  Psychiatric/Behavioral: Negative.     Physical Exam  Constitutional: He is oriented to person, place, and time. He appears well-developed and well-nourished.  Neck: Normal range of motion.  Cardiovascular: Normal rate and regular rhythm.   Respiratory: Effort normal and breath sounds normal.  GI: Soft. Bowel sounds are normal.  Neurological: He is alert and oriented to person, place, and time.  Skin: Skin is warm and dry.       Current Medications:   Current Outpatient Prescriptions  Medication Sig Dispense Refill  . acetaminophen (TYLENOL) 500 MG tablet Take 1,000 mg  by mouth every 6 (six) hours as needed. For pain/headaches     . ALPRAZolam (XANAX) 1 MG tablet Take 1 mg by mouth 4 (four) times daily as needed for anxiety.     Marland Kitchen amiodarone (PACERONE) 200 MG tablet Take 1 tablet (200 mg total) by mouth daily. 90 tablet 1  . apixaban (ELIQUIS) 5 MG TABS tablet Take 1 tablet (5 mg total) by mouth 2 (two) times daily. 180 tablet 0  . atorvastatin (LIPITOR) 20 MG tablet Take 1 tablet (20 mg total) by mouth daily at 6 PM. 90 tablet 1  . bismuth subsalicylate (PEPTO BISMOL) 262 MG/15ML suspension Take 30 mLs by mouth every 6 (six) hours as needed for indigestion.    . celecoxib (CELEBREX) 200 MG capsule Take 1 capsule by mouth 2 (two) times daily.    Marland Kitchen docusate sodium (COLACE) 50 MG capsule Take 50 mg by mouth 2 (two) times daily.    Marland Kitchen doxycycline (VIBRA-TABS) 100 MG tablet Take 100 mg by mouth 2 (two) times daily.    . fenofibrate (TRICOR) 145 MG tablet Take 1 tablet (145 mg total) by mouth daily. 90 tablet 1  . ferrous sulfate 325 (65 FE) MG EC tablet Take 325 mg by mouth every morning.    . gabapentin (NEURONTIN) 300 MG capsule Take 300 mg by mouth 2 (two) times daily.     Marland Kitchen HYDROcodone-acetaminophen (NORCO) 10-325 MG per tablet as  needed.    . insulin glargine (LANTUS) 100 UNIT/ML injection Inject 60-70 Units into the skin 2 (two) times daily. Takes70 units in the morning and 60 units in the evening    . insulin lispro (HUMALOG) 100 UNIT/ML injection Inject 50-60 Units into the skin 3 (three) times daily before meals. 50U BID and 60U QHS    . levothyroxine (SYNTHROID, LEVOTHROID) 25 MCG tablet Take 25 mcg by mouth daily.      . metoprolol succinate (TOPROL-XL) 25 MG 24 hr tablet Take 1 tablet (25 mg total) by mouth daily. 90 tablet 1  . Omega-3 Fatty Acids (FISH OIL PO) Take 2 capsules by mouth daily.    . pantoprazole (PROTONIX) 40 MG tablet Take 1 tablet (40 mg total) by mouth 2 (two) times daily. 180 tablet 1  . silver sulfADIAZINE (SILVADENE) 1 % cream Apply  1 application topically 4 (four) times daily.    Marland Kitchen torsemide (DEMADEX) 10 MG tablet TAKE ONE (1) TABLET EACH DAY 30 tablet 3  . aspirin 81 MG EC tablet Take 81 mg by mouth daily.      Marland Kitchen docusate sodium (COLACE) 100 MG capsule Take 100 mg by mouth 2 (two) times daily.       No current facility-administered medications for this visit.    Functional Status:   In your present state of health, do you have any difficulty performing the following activities: 02/18/2015 02/11/2015  Hearing? N Y  Vision? N N  Difficulty concentrating or making decisions? Y N  Walking or climbing stairs? N Y  Dressing or bathing? N N  Doing errands, shopping? N N  Preparing Food and eating ? N N  Using the Toilet? N N  In the past six months, have you accidently leaked urine? N N  Do you have problems with loss of bowel control? N N  Managing your Medications? N N  Managing your Finances? N N  Housekeeping or managing your Housekeeping? Y Y    Fall/Depression Screening:    Fall Risk  02/18/2015 02/11/2015  Falls in the past year? Yes Yes  Number falls in past yr: 1 1  Injury with Fall? No Yes  Risk for fall due to : Impaired balance/gait Impaired balance/gait  Risk for fall due to (comments): stroke that has affected right side -  Follow up Falls evaluation completed Falls prevention discussed   PHQ 2/9 Scores 02/18/2015 02/11/2015  PHQ - 2 Score 2 2  PHQ- 9 Score 2 7    Assessment:   Needs someone to stay with him after procedure Needs someone to [employer] doing better, patient eating more protein-encouraged patient to continue trend to assist with healing of foot wound and heal quicker after stent placement  Plan:  Patient to find family member to stay with him at least a day after his stent placement, also will check on someone in family to [place] yard. Patient will have stent placement 03/18/15 Contact [facility] CSW, Theadore Nan to see if there are any community resources for yard mowing. Visit with  patient in August   Chaia Ikard E. Laymond Purser, RN, BSN, [place] [phone] [facility] CM Care Plan Problem One        Patient Outreach from 02/11/2015 in [place] Problem One  Patient needs follow up with his PCP   Care Plan for Problem One  Active   [facility] CM Short Term Goal #1 (0-30 days)  Patient to  call his PCP tomorrow morning to make an appointment to have his toe seen again. At this appointment, patient also to let PCP know that he has only been taking doxycycline once daily and ask about the change in his pantoprazole dose. Mr. Failla also to ask his PCP about referral to another Podiatrist for new diabetic shoes.   [facility] CM Short Term Goal #1 Start Date  02/11/15   Interventions for Short Term Goal #1  Counseled patient about the importance of follow up as this infection is still present and about taking his antibiotic as directed    [facility] CM Care Plan Problem Two        Patient Outreach from 03/05/2015 in [place] for Problem Two  Active   Interventions for Problem Two Long Term Goal   Using teachback method, instructed patient to keep appointments with surgeon and other appointments related to foot wound   [facility] Long Term Goal (31-90) days  Patient stated goal "I want my foot to heal", Patient foot will show improvement to wound over next 90 days   [facility] Long Term Goal Start Date  02/19/15   [facility] CM Short Term Goal #1 (0-30 days)  Patient will take antibiotic as directed over next 14 days   [facility] CM Short Term Goal #1 Start Date  02/19/15   [facility] CM Short Term Goal #1 Met Date   03/05/15 [finished antibotic]   [facility] CM Short Term Goal #2 (0-30 days)  Patient will increase protein in his diet over next 14-21 days for wound healing.   [facility] CM Short Term Goal #2 Start Date  02/19/15   Interventions for Short Term Goal #2  reviewed diet and what patient has been eating over last couple of weeks.   [facility] CM Short Term Goal #3 (0-30 days)   Patient CBG's will be under better control over next 30 days   Interventions for Short Term Goal #3  using teachback method, reveiwed CBGs on meter

## 2015-03-06 ENCOUNTER — Other Ambulatory Visit: Payer: Self-pay | Admitting: *Deleted

## 2015-03-06 NOTE — Patient Outreach (Signed)
Response from [place], [employer], re: resource for [employer] is to find local [employer] troop or [employer]. Call to [facility], spoke with Skip Estimable. They may be able to provide yard mowing services. Call to patient to verify ok to give out his name and number, no answer, had to leave a message. Await return call from patient  Plan to contact [facility] with patient information after verifying permission to give out information to resource. Royetta Crochet. Laymond Purser, RN, BSN, [place] [phone]

## 2015-03-09 ENCOUNTER — Other Ambulatory Visit: Payer: Self-pay | Admitting: Pharmacist

## 2015-03-09 DIAGNOSIS — E10621 Type 1 diabetes mellitus with foot ulcer: Secondary | ICD-10-CM | POA: Diagnosis not present

## 2015-03-09 DIAGNOSIS — L97519 Non-pressure chronic ulcer of other part of right foot with unspecified severity: Secondary | ICD-10-CM | POA: Diagnosis not present

## 2015-03-09 NOTE — Patient Outreach (Signed)
Called to follow up with [name] about the healing of his toe, his medication alarm and his medication adherence.  [name] reports that he is currently sitting outside of the office of his toe surgeon. Reports that he is about to go in for an appointment. Reports that his toe has been healing, but is not yet healed. Reports that "the blood is not flowing enough" and the surgeon is going to put in a stent. [name] states that he is still taking the doxycycline. Patient reports that he has been taking this twice daily, placing a capsule in both the morning and evening slots of his pillbox.   Patient reports that his medication alarm is working well for him. Reports that the sound is very loud and gets his attention. Reports that he does not believe that he has missed any doses since having this tool.  Patient reports that he has misplaced my business card and asks if I will send him a new one in the mail so that he will have my phone number. Patient asks about the indication for his heart medications. We discuss what each is for. Patient verbalizes understanding. Patient states that he has no further medication questions at this time.  Will mail [name] my business card today. Will follow up with [name] again in 1 month to see how his toe is healing and check to see if he has any further medication questions.  Harlow Asa, PharmD Clinical Pharmacist [place] [facility] [place], PharmD Clinical Pharmacist [facility] [phone]

## 2015-03-10 DIAGNOSIS — E10621 Type 1 diabetes mellitus with foot ulcer: Secondary | ICD-10-CM | POA: Diagnosis not present

## 2015-03-10 DIAGNOSIS — L97519 Non-pressure chronic ulcer of other part of right foot with unspecified severity: Secondary | ICD-10-CM | POA: Diagnosis not present

## 2015-03-11 DIAGNOSIS — L97519 Non-pressure chronic ulcer of other part of right foot with unspecified severity: Secondary | ICD-10-CM | POA: Diagnosis not present

## 2015-03-11 DIAGNOSIS — E10621 Type 1 diabetes mellitus with foot ulcer: Secondary | ICD-10-CM | POA: Diagnosis not present

## 2015-03-12 DIAGNOSIS — L97519 Non-pressure chronic ulcer of other part of right foot with unspecified severity: Secondary | ICD-10-CM | POA: Diagnosis not present

## 2015-03-12 DIAGNOSIS — E10621 Type 1 diabetes mellitus with foot ulcer: Secondary | ICD-10-CM | POA: Diagnosis not present

## 2015-03-13 ENCOUNTER — Other Ambulatory Visit: Payer: Self-pay | Admitting: Internal Medicine

## 2015-03-13 NOTE — Telephone Encounter (Signed)
Rx(s) sent to pharmacy electronically.  

## 2015-03-16 DIAGNOSIS — L97519 Non-pressure chronic ulcer of other part of right foot with unspecified severity: Secondary | ICD-10-CM | POA: Diagnosis not present

## 2015-03-16 DIAGNOSIS — E10621 Type 1 diabetes mellitus with foot ulcer: Secondary | ICD-10-CM | POA: Diagnosis not present

## 2015-03-17 DIAGNOSIS — E10621 Type 1 diabetes mellitus with foot ulcer: Secondary | ICD-10-CM | POA: Diagnosis not present

## 2015-03-17 DIAGNOSIS — L97519 Non-pressure chronic ulcer of other part of right foot with unspecified severity: Secondary | ICD-10-CM | POA: Diagnosis not present

## 2015-03-18 ENCOUNTER — Other Ambulatory Visit (HOSPITAL_COMMUNITY): Payer: Self-pay | Admitting: Interventional Radiology

## 2015-03-18 ENCOUNTER — Ambulatory Visit
Admission: RE | Admit: 2015-03-18 | Discharge: 2015-03-18 | Disposition: A | Discharge status: Home / Self Care | Payer: Medicare Other | Source: Ambulatory Visit | Attending: General Surgery | Admitting: General Surgery

## 2015-03-18 ENCOUNTER — Other Ambulatory Visit: Payer: Self-pay | Admitting: Radiology

## 2015-03-18 DIAGNOSIS — L97519 Non-pressure chronic ulcer of other part of right foot with unspecified severity: Secondary | ICD-10-CM

## 2015-03-18 DIAGNOSIS — I739 Peripheral vascular disease, unspecified: Secondary | ICD-10-CM

## 2015-03-18 NOTE — Consult Note (Signed)
Chief Complaint: Insulin dependent diabetes. Slow to heal right great toe ulceration requiring daily wound care.   Referring Physician(s): Bradford,William  History of Present Illness: [name] is a 69 y.o. male with multiple comorbidities including hypertension, diabetes, aortic valve replacement. Patient is on chronic Eliquis for anticoagulation. He is being treated daily at the wound care center in [place] for a slow to heal right great toe ulceration. He is closely followed by Dr. Romona Curls. Dr. Romona Curls obtained ABIs demonstrating evidence of peripheral vascular disease, worse on the right. Right ABI 0.42. Left ABI 0.55. He presents for outpatient evaluation and further management. He has not had any additional cross-sectional imaging to assess his vascular anatomy.  Past Medical History  Diagnosis Date  . Hypertension   . Diabetes mellitus     type 2   . History of valve replacement 2010    [facility] Ease bioprosthetic 48mm  . Thyroid disease   . Stroke 2003    R-sided weakness & upper extremity swelling   . Coronary artery disease   . Dyslipidemia   . At risk for sudden cardiac death 06-20-2013  . PAF (paroxysmal atrial fibrillation) 05/2013    converted with amiodarone to SR  . Chronic anticoagulation 05/2013    started  . S/P cardiac catheterization, Rt & Lt heart cath 06/05/2013 06/20/2013  . S/P CABG x 3 2010  . GERD (gastroesophageal reflux disease)   . Cataract     Past Surgical History  Procedure Laterality Date  . Coronary artery bypass graft  01/26/2009    LIMA to LAD, SVG to circumflex, SVG to PDA  . Aortic valve replacement  01/26/2009    [facility] Ease bioprosthetic 106mm valve  . Transthoracic echocardiogram  09/2011    grade 1 diastolic dysfunction; increasing valve gradient; calcified MV annulus   . Cardiac catheterization  06/05/2013    native 3 vessel disease; patent LIMA to LAD, SVG to OM and SVG to PDA; Elevated right and  left heart filling pressures, with predominantly pulmonary venous hypertension, normal PVR  . Transthoracic echocardiogram  06/03/2013    EF 25-30%, mod conc. hypertrophy, grade 3 diastolic dysfunction; LA mod dilated; calcified MV annulus; transaortic gradients are normal for the bioprosthetic valve; inf vena cava dilated (elevated CVP) - LifeVest  . Left and right heart catheterization with coronary angiogram N/A 06/05/2013    Procedure: LEFT AND RIGHT HEART CATHETERIZATION WITH CORONARY ANGIOGRAM;  Surgeon: Leonie Man, MD;  Location: [facility] CATH LAB;  Service: Cardiovascular;  Laterality: N/A;  . Graft(s) angiogram  06/05/2013    Procedure: GRAFT(S) Cyril Loosen;  Surgeon: Leonie Man, MD;  Location: [facility] CATH LAB;  Service: Cardiovascular;;    Allergies: Review of patient's allergies indicates no known allergies.  Medications: Prior to Admission medications   Medication Sig Start Date End Date Taking? Authorizing Provider  acetaminophen (TYLENOL) 500 MG tablet Take 1,000 mg by mouth every 6 (six) hours as needed. For pain/headaches    Yes Historical Provider, MD  ALPRAZolam Duanne Moron) 1 MG tablet Take 1 mg by mouth 4 (four) times daily as needed for anxiety.    Yes Historical Provider, MD  amiodarone (PACERONE) 200 MG tablet Take 1 tablet (200 mg total) by mouth daily. 10/30/14  Yes Pixie Casino, MD  apixaban (ELIQUIS) 5 MG TABS tablet Take 1 tablet (5 mg total) by mouth 2 (two) times daily. 10/30/14  Yes Pixie Casino, MD  atorvastatin (LIPITOR) 20 MG  tablet Take 1 tablet (20 mg total) by mouth daily at 6 PM. 10/30/14  Yes Pixie Casino, MD  bismuth subsalicylate (PEPTO BISMOL) 262 MG/15ML suspension Take 30 mLs by mouth every 6 (six) hours as needed for indigestion.   Yes Historical Provider, MD  celecoxib (CELEBREX) 200 MG capsule Take 1 capsule by mouth 2 (two) times daily. 04/07/14  Yes Historical Provider, MD  docusate sodium (COLACE) 100 MG capsule Take 100 mg by mouth 2 (two) times  daily.     Yes Historical Provider, MD  doxycycline (VIBRA-TABS) 100 MG tablet Take 100 mg by mouth 2 (two) times daily.   Yes Historical Provider, MD  fenofibrate (TRICOR) 145 MG tablet Take 1 tablet (145 mg total) by mouth daily. 10/30/14  Yes Pixie Casino, MD  ferrous sulfate 325 (65 FE) MG EC tablet Take 325 mg by mouth every morning.   Yes Historical Provider, MD  gabapentin (NEURONTIN) 300 MG capsule Take 300 mg by mouth 2 (two) times daily.    Yes Historical Provider, MD  HYDROcodone-acetaminophen (NORCO) 10-325 MG per tablet as needed. 03/31/14  Yes Historical Provider, MD  insulin glargine (LANTUS) 100 UNIT/ML injection Inject 60-70 Units into the skin 2 (two) times daily. Takes70 units in the morning and 60 units in the evening   Yes Historical Provider, MD  insulin lispro (HUMALOG) 100 UNIT/ML injection Inject 50-60 Units into the skin 3 (three) times daily before meals. 50U BID and 60U QHS   Yes Historical Provider, MD  levothyroxine (SYNTHROID, LEVOTHROID) 25 MCG tablet Take 25 mcg by mouth daily.     Yes Historical Provider, MD  metoprolol succinate (TOPROL-XL) 25 MG 24 hr tablet Take 1 tablet (25 mg total) by mouth daily. 10/30/14  Yes Pixie Casino, MD  Omega-3 Fatty Acids (FISH OIL PO) Take 2 capsules by mouth daily.   Yes Historical Provider, MD  pantoprazole (PROTONIX) 40 MG tablet Take 1 tablet (40 mg total) by mouth 2 (two) times daily. 10/30/14  Yes Pixie Casino, MD  silver sulfADIAZINE (SILVADENE) 1 % cream Apply 1 application topically 4 (four) times daily.   Yes Historical Provider, MD  torsemide (DEMADEX) 10 MG tablet TAKE ONE (1) TABLET EACH DAY 03/13/15  Yes Pixie Casino, MD  aspirin 81 MG EC tablet Take 81 mg by mouth daily.      Historical Provider, MD  docusate sodium (COLACE) 50 MG capsule Take 50 mg by mouth 2 (two) times daily.    Historical Provider, MD     Family History  Problem Relation Age of Onset  . Heart attack Mother   . Liver disease Brother   .  Diabetes Sister     x4    History   Social History  . Marital Status: Divorced    Spouse Name: N/A  . Number of Children: 4  . Years of Education: N/A   Occupational History  . 4    Social History Main Topics  . Smoking status: Former Smoker    Types: Cigarettes    Quit date: 08/15/2001  . Smokeless tobacco: Former Systems developer    Types: Chew  . Alcohol Use: No  . Drug Use: No  . Sexual Activity: Not Currently   Other Topics Concern  . Not on file   Social History Narrative     Review of Systems: A 12 point ROS discussed and pertinent positives are indicated in the HPI above.  All other systems are negative.  Review of Systems  Constitutional: Negative for fever, activity change, appetite change, fatigue and unexpected weight change.  Respiratory: Negative for cough and shortness of breath.   Cardiovascular: Negative for chest pain.  Gastrointestinal: Negative for abdominal distention.  Genitourinary: Negative for flank pain.  Skin: Positive for wound.       Right great toe bandage is clean, intact and dry. This wound is closely followed daily in [place]. Dressing was not removed today.    Vital Signs: BP 150/71 mmHg  Pulse 62  Temp(Src) 97.6 F (36.4 C) (Oral)  Resp 14  Ht 5\' 10"  (1.778 m)  Wt 250 lb (113.399 kg)  BMI 35.87 kg/m2  SpO2 99%  Physical Exam  Constitutional: He appears well-developed and well-nourished. No distress.  Obese elderly male in no acute distress.  Cardiovascular: Normal rate and regular rhythm.  Exam reveals no friction rub.   No murmur heard. Palpable but diminished symmetric femoral pulses. Popliteal pulses are not palpable. Pedal posterior tibial pulses are weakly dopplerable. Dorsalis pedis pulses are absent.  Pulmonary/Chest: Effort normal and breath sounds normal. He has no wheezes. He has no rales.  Abdominal: Soft. Bowel sounds are normal. He exhibits no distension. There is no tenderness.  Truncal obesity. No organomegaly.    Skin: Skin is warm and dry. No rash noted. He is not diaphoretic. No erythema.     Imaging: US Carotid Bilateral  02/19/2015   CLINICAL DATA:  Carotid bruit, dizziness, diabetes mellitus, hypertension, former smoker, coronary artery disease post CABG and AVR, paroxysmal atrial fibrillation, hyperlipidemia  EXAM: BILATERAL CAROTID DUPLEX ULTRASOUND  TECHNIQUE: Pearline Cables scale imaging, color Doppler and duplex ultrasound were performed of bilateral carotid and vertebral arteries in the neck.  COMPARISON:  None  FINDINGS: Criteria: Quantification of carotid stenosis is based on velocity parameters that correlate the residual internal carotid diameter with NASCET-based stenosis levels, using the diameter of the distal internal carotid lumen as the denominator for stenosis measurement.  The following velocity measurements were obtained:  RIGHT  ICA:  106/27 cm/sec  CCA:  762/26 cm/sec  SYSTOLIC ICA/CCA RATIO:  0.8  DIASTOLIC ICA/CCA RATIO:  1.4  ECA:  147 cm/sec  LEFT  ICA:  94/19 cm/sec  CCA:  333/54 cm/sec  SYSTOLIC ICA/CCA RATIO:  0.9 DIASTOLIC ICA/CCA RATIO:  1.0  ECA:  119 cm/sec  RIGHT CAROTID ARTERY: Intimal thickening RIGHT CCA. Noncalcified plaque distal RIGHT CCA extending into RIGHT carotid bulb. Small amount of shadowing plaque at RIGHT carotid bulb with non shadowing plaque extending into proximal RIGHT ICA and LEFT CCA. Mildly turbulent blood flow in proximal RIGHT ICA on color Doppler imaging. No high velocity jets.  RIGHT VERTEBRAL ARTERY:  Patent, antegrade  LEFT CAROTID ARTERY: Mildly tortuous LEFT CCA with intimal thickening. Small scattered calcified non shadowing and noncalcified plaques within LEFT CCA and LEFT carotid bulb. Turbulent blood flow within the proximal LEFT ECA and ICA on color Doppler imaging with spectral broadening on waveform analysis. No high velocity jets.  LEFT VERTEBRAL ARTERY:  Patent, antegrade  IMPRESSION: Scattered plaque formation in the carotid systems bilaterally as  above.  Velocities correspond to less than 50% diameter stenoses bilaterally.   Electronically Signed   By: Lavonia Dana M.D.   On: 02/19/2015 16:33   US Arterial Seg Single  03/02/2015   CLINICAL DATA:  69 year old male with a history of right great toe ulcer for 2 months.  Cardiovascular risk factors include smoking, hypertension, hyperlipidemia, diabetes.  EXAM: NONINVASIVE PHYSIOLOGIC VASCULAR STUDY OF BILATERAL LOWER EXTREMITIES  TECHNIQUE: Evaluation of both lower extremities was performed at rest, including calculation of ankle-brachial indices, and segmental Doppler of the bilateral lower extremities.  COMPARISON:  None.  FINDINGS: Right:  Resting ankle brachial index:  0.42  Segmental Doppler: Segmental Doppler at the right ankle demonstrates deterioration of the waveform at the posterior tibial with monophasic flow. Abnormal dorsalis pedis waveform, with monophasic flow.  Left:  Resting ankle brachial index: 0.55  Segmental Doppler: Segmental Doppler at the left ankle demonstrates monophasic waveform of the posterior tibial artery and dorsalis pedis.  IMPRESSION: Resting ankle brachial index on the right demonstrates borderline severe arterial occlusive disease. Abnormal waveform of the posterior tibial artery and dorsalis pedis suggesting at least tibial disease. Involvement of more proximal segments cannot be determined on this study.  Resting ankle-brachial index on the left demonstrates moderate arterial occlusive disease. This value may decrease after exercise. Monophasic waveform of the posterior tibial artery and dorsalis pedis suggesting at least tibial disease. Involvement of more proximal segments cannot be determined on this study.  These results were called by telephone at the time of interpretation on 03/02/2015 at 12:15 pm to Dr. Felicie Morn , who verbally acknowledged these results.  Signed,  Dulcy Fanny. Earleen Newport, DO  [facility]  [facility]    Electronically Signed   By: Corrie Mckusick D.O.   On: 03/02/2015 12:15    Assessment and Plan:  Slow to healing right great toe ulceration with evidence of peripheral vascular disease and abnormal ABIs as above. He currently is receiving daily wound care in [place] by Dr. Romona Curls. He has insulin-dependent diabetes. Peripheral posterior tibial pulses are weakly dopplerable. Dorsalis pedis pulses are absent. He needs further workup with a CTA aorta runoff to evaluate his aortoiliac inflow and his peripheral vasculature. This will be performed in the next few days and he will will return next week to review the CTA runoff and available treatment options including percutaneous interventional techniques. All questions were addressed. He is in agreement with this plan.    Thank you for this interesting consult.  I greatly enjoyed meeting [name] and look forward to participating in their care.  A copy of this report was sent to the requesting provider on this date.  SignedGreggory Keen 03/18/2015, 2:02 PM   I spent a total of  40 Minutes   in face to face in clinical consultation, greater than 50% of which was counseling/coordinating care for this patient with peripheral vascular disease and a slow to heal right great toe ulceration.

## 2015-03-19 LAB — CREATININE WITH EST GFR
Creat: 1.35 mg/dL — ABNORMAL HIGH (ref 0.70–1.25)
GFR, Est African American: 63 mL/min (ref >=60)
GFR, Est Non African American: 54 mL/min — ABNORMAL LOW (ref >=60)

## 2015-03-19 LAB — BUN: BUN: 22 mg/dL (ref 7–25)

## 2015-03-20 DIAGNOSIS — L97519 Non-pressure chronic ulcer of other part of right foot with unspecified severity: Secondary | ICD-10-CM | POA: Diagnosis not present

## 2015-03-20 DIAGNOSIS — E10621 Type 1 diabetes mellitus with foot ulcer: Secondary | ICD-10-CM | POA: Diagnosis not present

## 2015-03-23 DIAGNOSIS — E10621 Type 1 diabetes mellitus with foot ulcer: Secondary | ICD-10-CM | POA: Diagnosis not present

## 2015-03-23 DIAGNOSIS — L97519 Non-pressure chronic ulcer of other part of right foot with unspecified severity: Secondary | ICD-10-CM | POA: Diagnosis not present

## 2015-03-24 ENCOUNTER — Ambulatory Visit: Payer: Medicare Other | Admitting: Internal Medicine

## 2015-03-24 DIAGNOSIS — E10621 Type 1 diabetes mellitus with foot ulcer: Secondary | ICD-10-CM | POA: Diagnosis not present

## 2015-03-24 DIAGNOSIS — L97519 Non-pressure chronic ulcer of other part of right foot with unspecified severity: Secondary | ICD-10-CM | POA: Diagnosis not present

## 2015-03-25 ENCOUNTER — Ambulatory Visit (HOSPITAL_COMMUNITY)
Admission: RE | Admit: 2015-03-25 | Discharge: 2015-03-25 | Disposition: A | Discharge status: Home / Self Care | Payer: Medicare Other | Source: Ambulatory Visit | Attending: Interventional Radiology | Admitting: Interventional Radiology

## 2015-03-25 DIAGNOSIS — E10621 Type 1 diabetes mellitus with foot ulcer: Secondary | ICD-10-CM | POA: Diagnosis not present

## 2015-03-25 DIAGNOSIS — K8020 Calculus of gallbladder without cholecystitis without obstruction: Secondary | ICD-10-CM | POA: Diagnosis not present

## 2015-03-25 DIAGNOSIS — I7789 Other specified disorders of arteries and arterioles: Secondary | ICD-10-CM | POA: Diagnosis not present

## 2015-03-25 DIAGNOSIS — I739 Peripheral vascular disease, unspecified: Secondary | ICD-10-CM | POA: Diagnosis not present

## 2015-03-25 DIAGNOSIS — L97519 Non-pressure chronic ulcer of other part of right foot with unspecified severity: Secondary | ICD-10-CM | POA: Diagnosis not present

## 2015-03-25 DIAGNOSIS — K8050 Calculus of bile duct without cholangitis or cholecystitis without obstruction: Secondary | ICD-10-CM | POA: Diagnosis not present

## 2015-03-25 DIAGNOSIS — K7460 Unspecified cirrhosis of liver: Secondary | ICD-10-CM | POA: Diagnosis not present

## 2015-03-25 MED ORDER — IOHEXOL 350 MG/ML SOLN
150.0000 mL | Freq: Once | INTRAVENOUS | Status: AC | PRN
  Administered 2015-03-25: 150 mL via INTRAVENOUS

## 2015-03-25 MED ORDER — SODIUM CHLORIDE 0.9 % IJ SOLN
INTRAMUSCULAR | Status: AC
  Filled 2015-03-25: qty 750

## 2015-03-26 ENCOUNTER — Other Ambulatory Visit: Payer: Self-pay | Admitting: *Deleted

## 2015-03-26 ENCOUNTER — Ambulatory Visit
Admission: RE | Admit: 2015-03-26 | Discharge: 2015-03-26 | Disposition: A | Discharge status: Home / Self Care | Payer: Medicare Other | Source: Ambulatory Visit | Attending: Interventional Radiology | Admitting: Interventional Radiology

## 2015-03-26 ENCOUNTER — Encounter: Payer: Self-pay | Admitting: *Deleted

## 2015-03-26 DIAGNOSIS — L97519 Non-pressure chronic ulcer of other part of right foot with unspecified severity: Secondary | ICD-10-CM | POA: Diagnosis not present

## 2015-03-26 DIAGNOSIS — I998 Other disorder of circulatory system: Secondary | ICD-10-CM | POA: Insufficient documentation

## 2015-03-26 DIAGNOSIS — I70229 Atherosclerosis of native arteries of extremities with rest pain, unspecified extremity: Secondary | ICD-10-CM | POA: Insufficient documentation

## 2015-03-26 NOTE — Patient Outreach (Signed)
[facility]) Care Management   03/26/2015  [facility] 03/09/1946 [id number]  [name] is an 69 y.o. male  Subjective:  Patient reports his blood sugars are doing good and his foot is healing.  Patient states he had CT scan yesterday, he also has some type of test this afternoon. Patient states they have held off on stent placement, they wanted to run more tests, he thinks that he will Know after today's appointment. Patient states has good circulation in toe now, he is still going daily for dressing changes.   Patient states his kidney function is not doing too well and he is having labs and going to a kidney doctor in September, he states he will have some labs and then see the doctor, he is not really sure what specifically they are looking for other than "my kidneys are not working right".   Objective:   Patient neatly groomed and dressed, home neat and clean and uncluttered Filed Vitals:   03/26/15 1007  BP: 148/72  Pulse: 57  Resp: 20   Review of Systems  Constitutional: Negative.   HENT: Negative.   Eyes: Negative.   Respiratory: Negative.   Cardiovascular: Positive for leg swelling.       Slight edema on feet around sandal straps  Gastrointestinal: Negative.   Genitourinary: Negative.   Musculoskeletal: Negative.   Skin:       Wound on right great toe, dressing intact  Neurological: Negative.        Right sided weakness from past stroke  Endo/Heme/Allergies: Negative.   Psychiatric/Behavioral: Negative.     Physical Exam  Constitutional: He is oriented to person, place, and time. He appears well-developed.  HENT:  Head: Normocephalic.  Neck: Normal range of motion.  Cardiovascular: Normal rate and regular rhythm.   Respiratory: Effort normal and breath sounds normal.  GI: Soft. Bowel sounds are normal.  Musculoskeletal: Normal range of motion.  Neurological: He is alert and oriented to person, place, and time.  Skin: Skin  is warm and dry.  Dressing intact on rt great toe  Psychiatric: He has a normal mood and affect. His behavior is normal. Thought content normal.    Current Medications:   Current Outpatient Prescriptions  Medication Sig Dispense Refill  . acetaminophen (TYLENOL) 500 MG tablet Take 1,000 mg by mouth every 6 (six) hours as needed. For pain/headaches     . ALPRAZolam (XANAX) 1 MG tablet Take 1 mg by mouth 4 (four) times daily as needed for anxiety.     Marland Kitchen amiodarone (PACERONE) 200 MG tablet Take 1 tablet (200 mg total) by mouth daily. 90 tablet 1  . apixaban (ELIQUIS) 5 MG TABS tablet Take 1 tablet (5 mg total) by mouth 2 (two) times daily. 180 tablet 0  . aspirin 81 MG EC tablet Take 81 mg by mouth daily.      Marland Kitchen atorvastatin (LIPITOR) 20 MG tablet Take 1 tablet (20 mg total) by mouth daily at 6 PM. 90 tablet 1  . bismuth subsalicylate (PEPTO BISMOL) 262 MG/15ML suspension Take 30 mLs by mouth every 6 (six) hours as needed for indigestion.    . celecoxib (CELEBREX) 200 MG capsule Take 1 capsule by mouth 2 (two) times daily.    Marland Kitchen docusate sodium (COLACE) 100 MG capsule Take 100 mg by mouth 2 (two) times daily.      Marland Kitchen docusate sodium (COLACE) 50 MG capsule Take 50 mg by mouth 2 (two) times daily.    Marland Kitchen  doxycycline (VIBRA-TABS) 100 MG tablet Take 100 mg by mouth 2 (two) times daily.    . fenofibrate (TRICOR) 145 MG tablet Take 1 tablet (145 mg total) by mouth daily. 90 tablet 1  . ferrous sulfate 325 (65 FE) MG EC tablet Take 325 mg by mouth every morning.    . gabapentin (NEURONTIN) 300 MG capsule Take 300 mg by mouth 2 (two) times daily.     Marland Kitchen HYDROcodone-acetaminophen (NORCO) 10-325 MG per tablet as needed.    . insulin glargine (LANTUS) 100 UNIT/ML injection Inject 60-70 Units into the skin 2 (two) times daily. Takes70 units in the morning and 60 units in the evening    . insulin lispro (HUMALOG) 100 UNIT/ML injection Inject 50-60 Units into the skin 3 (three) times daily before meals. 50U BID  and 60U QHS    . levothyroxine (SYNTHROID, LEVOTHROID) 25 MCG tablet Take 25 mcg by mouth daily.      . metoprolol succinate (TOPROL-XL) 25 MG 24 hr tablet Take 1 tablet (25 mg total) by mouth daily. 90 tablet 1  . Omega-3 Fatty Acids (FISH OIL PO) Take 2 capsules by mouth daily.    . pantoprazole (PROTONIX) 40 MG tablet Take 1 tablet (40 mg total) by mouth 2 (two) times daily. 180 tablet 1  . silver sulfADIAZINE (SILVADENE) 1 % cream Apply 1 application topically 4 (four) times daily.    Marland Kitchen torsemide (DEMADEX) 10 MG tablet TAKE ONE (1) TABLET EACH DAY 30 tablet 1   No current facility-administered medications for this visit.    Functional Status:   In your present state of health, do you have any difficulty performing the following activities: 02/18/2015 02/11/2015  Hearing? N Y  Vision? N N  Difficulty concentrating or making decisions? Y N  Walking or climbing stairs? N Y  Dressing or bathing? N N  Doing errands, shopping? N N  Preparing Food and eating ? N N  Using the Toilet? N N  In the past six months, have you accidently leaked urine? N N  Do you have problems with loss of bowel control? N N  Managing your Medications? N N  Managing your Finances? N N  Housekeeping or managing your Housekeeping? Y Y    Fall/Depression Screening:   Fall Risk  02/18/2015 02/11/2015  Falls in the past year? Yes Yes  Number falls in past yr: 1 1  Injury with Fall? No Yes  Risk for fall due to : Impaired balance/gait Impaired balance/gait  Risk for fall due to (comments): stroke that has affected right side -  Follow up Falls evaluation completed Falls prevention discussed    PHQ 2/9 Scores 02/18/2015 02/11/2015  PHQ - 2 Score 2 2  PHQ- 9 Score 2 7    Assessment:   Patient CBGs still elevated but he is doing better with increased protein Reviewed diet and CBG's with patient Reviewed upcoming appointments  Plan:  Will visit next month take EMMI education around kidney function and kidney  disease  Patient will keep his appointment with wound care MD Patient will work on keeping blood sugars down Patient will call RNCM if any new concerns arise  [facility] CM Care Plan Problem One        Patient Outreach from 02/11/2015 in [place] Problem One  Patient needs follow up with his PCP   Care Plan for Problem One  Active   [facility] CM Short Term Goal #1 (0-30 days)  Patient  to call his PCP tomorrow morning to make an appointment to have his toe seen again. At this appointment, patient also to let PCP know that he has only been taking doxycycline once daily and ask about the change in his pantoprazole dose. Mr. Mcgaugh also to ask his PCP about referral to another Podiatrist for new diabetic shoes.   [facility] CM Short Term Goal #1 Start Date  02/11/15   Interventions for Short Term Goal #1  Counseled patient about the importance of follow up as this infection is still present and about taking his antibiotic as directed    [facility] CM Care Plan Problem Two        Patient Outreach from 03/26/2015 in [place] Problem Two  Uncontrolled diabetes as evidenced by elevated CBGs, HGA1C and nonhealing wound.   Care Plan for Problem Two  Active   Interventions for Problem Two Long Term Goal   Reviewed wound care, reviewed CBG records. Verified patient keeping appointments wtih surgeon for wound care   [facility] Long Term Goal (31-90) days  Patient stated goal "I want my foot to heal", Patient foot will show improvement to wound over next 90 days   [facility] Long Term Goal Start Date  02/19/15   [facility] CM Short Term Goal #2 (0-30 days)  Patient will increase protein in his diet over next 14-21 days for wound healing.   [facility] CM Short Term Goal #2 Start Date  02/19/15   [facility] CM Short Term Goal #2 Met Date  03/26/15   Interventions for Short Term Goal #2  reviewed diet with patient, patient has increased protein intake such as eggs and chicken   [facility] CM Short Term Goal #3 (0-30 days)   Patient CBG's will be under better control over next 30 days   [facility] CM Short Term Goal #3 Start Date  02/19/15   Interventions for Short Term Goal #3  Using teachback method, reviewed CBGs and 7, 14, 28 day average and encouraged patient with keeping them low      Stanton Kidney E. Laymond Purser, RN, BSN, [place] [phone]

## 2015-03-26 NOTE — Progress Notes (Signed)
Chief Complaint: Patient was seen in consultation today for  Chief Complaint  Patient presents with  . Follow-up   at the request of [facility]  Referring Physician(s): Jala Dundon  History of Present Illness: [name] is a 69 y.o. male with a slow healing ulceration to the right great toe. Workup for peripheral arterial disease demonstrates advanced right lower extremity peripheral arterial disease including long segment occlusion of the superficial femoral artery as well as probable popliteal and runoff disease.  He was seen by my partner, Dr. Annamaria Boots, last week. Please see Dr. Fritz Pickerel note for full history and physical. He presents today to review his anatomic imaging and to discuss a plan of care. He has had no interval change in his health status over the past week.  Past Medical History  Diagnosis Date  . Hypertension   . Diabetes mellitus     type 2   . History of valve replacement 2010    [facility] Ease bioprosthetic 55mm  . Thyroid disease   . Stroke 2003    R-sided weakness & upper extremity swelling   . Coronary artery disease   . Dyslipidemia   . At risk for sudden cardiac death 06/21/2013  . PAF (paroxysmal atrial fibrillation) 05/2013    converted with amiodarone to SR  . Chronic anticoagulation 05/2013    started  . S/P cardiac catheterization, Rt & Lt heart cath 06/05/2013 06-21-13  . S/P CABG x 3 2010  . GERD (gastroesophageal reflux disease)   . Cataract   . Chronic kidney disease     Patient reports he is seeing Kidney doctor in September    Past Surgical History  Procedure Laterality Date  . Coronary artery bypass graft  01/26/2009    LIMA to LAD, SVG to circumflex, SVG to PDA  . Aortic valve replacement  01/26/2009    [facility] Ease bioprosthetic 42mm valve  . Transthoracic echocardiogram  09/2011    grade 1 diastolic dysfunction; increasing valve gradient; calcified MV annulus   . Cardiac catheterization   06/05/2013    native 3 vessel disease; patent LIMA to LAD, SVG to OM and SVG to PDA; Elevated right and left heart filling pressures, with predominantly pulmonary venous hypertension, normal PVR  . Transthoracic echocardiogram  06/03/2013    EF 25-30%, mod conc. hypertrophy, grade 3 diastolic dysfunction; LA mod dilated; calcified MV annulus; transaortic gradients are normal for the bioprosthetic valve; inf vena cava dilated (elevated CVP) - LifeVest  . Left and right heart catheterization with coronary angiogram N/A 06/05/2013    Procedure: LEFT AND RIGHT HEART CATHETERIZATION WITH CORONARY ANGIOGRAM;  Surgeon: Leonie Man, MD;  Location: [facility] CATH LAB;  Service: Cardiovascular;  Laterality: N/A;  . Graft(s) angiogram  06/05/2013    Procedure: GRAFT(S) Cyril Loosen;  Surgeon: Leonie Man, MD;  Location: [facility] CATH LAB;  Service: Cardiovascular;;    Allergies: Review of patient's allergies indicates no known allergies.  Medications: Prior to Admission medications   Medication Sig Start Date End Date Taking? Authorizing Provider  acetaminophen (TYLENOL) 500 MG tablet Take 1,000 mg by mouth every 6 (six) hours as needed. For pain/headaches     Historical Provider, MD  ALPRAZolam Duanne Moron) 1 MG tablet Take 1 mg by mouth 4 (four) times daily as needed for anxiety.     Historical Provider, MD  amiodarone (PACERONE) 200 MG tablet Take 1 tablet (200 mg total) by mouth daily. 10/30/14   Pixie Casino, MD  apixaban Arne Cleveland)  5 MG TABS tablet Take 1 tablet (5 mg total) by mouth 2 (two) times daily. 10/30/14   Pixie Casino, MD  aspirin 81 MG EC tablet Take 81 mg by mouth daily.      Historical Provider, MD  atorvastatin (LIPITOR) 20 MG tablet Take 1 tablet (20 mg total) by mouth daily at 6 PM. 10/30/14   Pixie Casino, MD  bismuth subsalicylate (PEPTO BISMOL) 262 MG/15ML suspension Take 30 mLs by mouth every 6 (six) hours as needed for indigestion.    Historical Provider, MD  celecoxib (CELEBREX) 200  MG capsule Take 1 capsule by mouth 2 (two) times daily. 04/07/14   Historical Provider, MD  docusate sodium (COLACE) 100 MG capsule Take 100 mg by mouth 2 (two) times daily.      Historical Provider, MD  docusate sodium (COLACE) 50 MG capsule Take 50 mg by mouth 2 (two) times daily.    Historical Provider, MD  doxycycline (VIBRA-TABS) 100 MG tablet Take 100 mg by mouth 2 (two) times daily.    Historical Provider, MD  fenofibrate (TRICOR) 145 MG tablet Take 1 tablet (145 mg total) by mouth daily. 10/30/14   Pixie Casino, MD  ferrous sulfate 325 (65 FE) MG EC tablet Take 325 mg by mouth every morning.    Historical Provider, MD  gabapentin (NEURONTIN) 300 MG capsule Take 300 mg by mouth 2 (two) times daily.     Historical Provider, MD  HYDROcodone-acetaminophen (NORCO) 10-325 MG per tablet as needed. 03/31/14   Historical Provider, MD  insulin glargine (LANTUS) 100 UNIT/ML injection Inject 60-70 Units into the skin 2 (two) times daily. Takes70 units in the morning and 60 units in the evening    Historical Provider, MD  insulin lispro (HUMALOG) 100 UNIT/ML injection Inject 50-60 Units into the skin 3 (three) times daily before meals. 50U BID and 60U QHS    Historical Provider, MD  levothyroxine (SYNTHROID, LEVOTHROID) 25 MCG tablet Take 25 mcg by mouth daily.      Historical Provider, MD  metoprolol succinate (TOPROL-XL) 25 MG 24 hr tablet Take 1 tablet (25 mg total) by mouth daily. 10/30/14   Pixie Casino, MD  Omega-3 Fatty Acids (FISH OIL PO) Take 2 capsules by mouth daily.    Historical Provider, MD  pantoprazole (PROTONIX) 40 MG tablet Take 1 tablet (40 mg total) by mouth 2 (two) times daily. 10/30/14   Pixie Casino, MD  silver sulfADIAZINE (SILVADENE) 1 % cream Apply 1 application topically 4 (four) times daily.    Historical Provider, MD  torsemide (DEMADEX) 10 MG tablet TAKE ONE (1) TABLET EACH DAY 03/13/15   Pixie Casino, MD     Family History  Problem Relation Age of Onset  . Heart  attack Mother   . Liver disease Brother   . Diabetes Sister     x4    Social History   Social History  . Marital Status: Divorced    Spouse Name: N/A  . Number of Children: 4  . Years of Education: N/A   Occupational History  . 4    Social History Main Topics  . Smoking status: Former Smoker    Types: Cigarettes    Quit date: 08/15/2001  . Smokeless tobacco: Former Systems developer    Types: Chew  . Alcohol Use: No  . Drug Use: No  . Sexual Activity: Not Currently   Other Topics Concern  . Not on file   Social History Narrative  Review of Systems: A 12 point ROS discussed and pertinent positives are indicated in the HPI above.  All other systems are negative.  Review of Systems  Vital Signs: BP 159/74 mmHg  Pulse 62  Temp(Src) 97.6 F (36.4 C) (Oral)  Resp 14  SpO2 95%  Physical Exam  See last week's consult  Imaging: Ct Angio Ao+bifem W/cm &/or Wo/cm  03/25/2015   CLINICAL DATA:  Right great toe ulcer  EXAM: CT ANGIOGRAPHY OF ABDOMINAL AORTA WITH ILIOFEMORAL RUNOFF  TECHNIQUE: Multidetector CT imaging of the abdomen, pelvis and lower extremities was performed using the standard protocol during bolus administration of intravenous contrast. Multiplanar CT image reconstructions and MIPs were obtained to evaluate the vascular anatomy.  CONTRAST:  145mL OMNIPAQUE IOHEXOL 350 MG/ML SOLN  COMPARISON:  None.  FINDINGS: The aorta is non aneurysmal and patent with mild diffuse atherosclerotic calcifications.  Celiac axis is patent. Branch vessels are patent and non aneurysmal.  SMA is patent. Mild diffuse atherosclerotic changes with some plaque in the mid SMA is noted.  Single renal arteries are patent and non aneurysmal.  IMA origin is patent.  Branch vessels are diminutive and patent.  Right common and external iliac arteries are patent. Moderate diffuse atherosclerotic calcifications. These vessels are diminutive. Right internal iliac artery is moderately diseased.  Left common  iliac and external iliac arteries are patent. Similarly, they are diminutive with moderate atherosclerotic calcifications.  Right common femoral artery is patent with atherosclerotic calcifications. The right superficial femoral artery is occluded at its origin. Profunda femoral artery branches are patent. The superficial femoral artery reconstitutes in the adductor canal. Popliteal artery is grossly patent. There is poor opacification in the tibial vessels.  Left common femoral artery and profunda femoral arteries are patent. There is poor opacification of the femoral arterial system. There is mild diffuse disease involving the proximal and mid left superficial femoral artery. I suspect that there is severe disease in the distal left superficial femoral artery with significant stenoses in the adductor canal. The popliteal artery faintly reconstitutes above the knee. The popliteal artery at the level of the knee and tibial vessels are obscured.  The left lobe of the liver is enlarged. The contour of the liver is slightly nodular. These findings suggest early cirrhotic change. No liver mass  Gallstones.  Spleen, pancreas, adrenal glands are within normal limits.  Simple cyst in left kidney.  Mild chronic changes of the kidneys.  Normal appendix. Bladder and prostate are within normal limits. Unremarkable sigmoid colon  No free-fluid.  No abnormal retroperitoneal adenopathy.  L5-S1 degenerative disc disease is present with posterior disc osteophytes which encroach upon the left S1 nerve root sleeve. No vertebral compression deformity.  Left inguinal hernia contains adipose tissue.  Review of the MIP images confirms the above findings.  IMPRESSION: No significant aorto or iliac inflow stenosis. Atherosclerotic calcifications are noted with diminutive vessels.  Right lower extremity runoff demonstrates long segment occlusion of the right superficial femoral artery. The popliteal artery reconstitutes above the knee  joint. Tibial vessels are obscured by poor contrast opacification.  In the left lower extremity, the common femoral artery and proximal superficial femoral artery are grossly patent. I suspect significant stenosis of the distal left superficial femoral artery. The popliteal and tibial arteries are obscured by poor contrast opacification.  Cholelithiasis.  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   Electronically Signed   By: Marybelle Killings M.D.   On: 03/25/2015 11:13   US Arterial Seg Single  03/02/2015   CLINICAL DATA:  69 year old male with a history of right great toe ulcer for 2 months.  Cardiovascular risk factors include smoking, hypertension, hyperlipidemia, diabetes.  EXAM: NONINVASIVE PHYSIOLOGIC VASCULAR STUDY OF BILATERAL LOWER EXTREMITIES  TECHNIQUE: Evaluation of both lower extremities was performed at rest, including calculation of ankle-brachial indices, and segmental Doppler of the bilateral lower extremities.  COMPARISON:  None.  FINDINGS: Right:  Resting ankle brachial index:  0.42  Segmental Doppler: Segmental Doppler at the right ankle demonstrates deterioration of the waveform at the posterior tibial with monophasic flow. Abnormal dorsalis pedis waveform, with monophasic flow.  Left:  Resting ankle brachial index: 0.55  Segmental Doppler: Segmental Doppler at the left ankle demonstrates monophasic waveform of the posterior tibial artery and dorsalis pedis.  IMPRESSION: Resting ankle brachial index on the right demonstrates borderline severe arterial occlusive disease. Abnormal waveform of the posterior tibial artery and dorsalis pedis suggesting at least tibial disease. Involvement of more proximal segments cannot be determined on this study.  Resting ankle-brachial index on the left demonstrates moderate arterial occlusive disease. This value may decrease after exercise. Monophasic waveform of the posterior tibial artery and dorsalis pedis suggesting at least tibial disease. Involvement of  more proximal segments cannot be determined on this study.  These results were called by telephone at the time of interpretation on 03/02/2015 at 12:15 pm to Dr. Felicie Morn , who verbally acknowledged these results.  Signed,  Dulcy Fanny. Earleen Newport, DO  [facility]  [facility]   Electronically Signed   By: Corrie Mckusick D.O.   On: 03/02/2015 12:15    Labs:  CBC: No results for input(s): WBC, HGB, HCT, PLT in the last 8760 hours.  COAGS: No results for input(s): INR, APTT in the last 8760 hours.  BMP:  Recent Labs  03/18/15 0831  BUN 22  CREATININE 1.35*  GFRNONAA 54*  GFRAA 63    LIVER FUNCTION TESTS: No results for input(s): BILITOT, AST, ALT, ALKPHOS, PROT, ALBUMIN in the last 8760 hours.  TUMOR MARKERS: No results for input(s): AFPTM, CEA, CA199, CHROMGRNA in the last 8760 hours.  Assessment and Plan:  69 year old male with multiple medical comorbidities with severe bilateral peripheral arterial disease manifesting as mild claudication in the left lower extremity (Rutherford category 1/ Fontaine stage IIa) and critical limb ischemia (Rutherford category V Fontaine stage IV) on the right.  He is a probable candidate for endovascular revascularization of his symptomatic right lower extremity. He has had prior harvesting of his right saphenous vein for coronary artery bypass surgery. His left saphenous vein remains in place. There is some possibility that at the time of angiography his disease is determined to severe for endovascular approach. If that is the case, I will then refer to vascular surgery to evaluate for bypass.  1.) Schedule for right lower extremity angiography and revascularization to be performed at [place] Dr. Laurence Ferrari when the procedure is scheduled to discuss required inventory.  He will need to hold his Eliquis prior to the procedure.   2.) Although he has significant peripheral arterial  disease on the left, his claudication is fairly mild at this time. We will continue to monitor this lower extremity. It is claudication becomes more severe or he develops critical limb ischemia intervention in that extremity may become warranted.    SignedJacqulynn Cadet 03/26/2015, 4:15 PM   I spent a total of 10 Minutes in face to face in clinical consultation, greater than  50% of which was counseling/coordinating care for critical limb ischemia.

## 2015-03-31 DIAGNOSIS — L97519 Non-pressure chronic ulcer of other part of right foot with unspecified severity: Secondary | ICD-10-CM | POA: Diagnosis not present

## 2015-03-31 DIAGNOSIS — E10621 Type 1 diabetes mellitus with foot ulcer: Secondary | ICD-10-CM | POA: Diagnosis not present

## 2015-04-01 DIAGNOSIS — M86171 Other acute osteomyelitis, right ankle and foot: Secondary | ICD-10-CM | POA: Diagnosis not present

## 2015-04-01 DIAGNOSIS — I10 Essential (primary) hypertension: Secondary | ICD-10-CM | POA: Diagnosis not present

## 2015-04-01 DIAGNOSIS — M79674 Pain in right toe(s): Secondary | ICD-10-CM | POA: Diagnosis not present

## 2015-04-01 DIAGNOSIS — E079 Disorder of thyroid, unspecified: Secondary | ICD-10-CM | POA: Diagnosis not present

## 2015-04-01 DIAGNOSIS — L089 Local infection of the skin and subcutaneous tissue, unspecified: Secondary | ICD-10-CM | POA: Diagnosis not present

## 2015-04-01 DIAGNOSIS — E11621 Type 2 diabetes mellitus with foot ulcer: Secondary | ICD-10-CM | POA: Diagnosis not present

## 2015-04-09 DIAGNOSIS — Z6838 Body mass index (BMI) 38.0-38.9, adult: Secondary | ICD-10-CM | POA: Diagnosis not present

## 2015-04-09 DIAGNOSIS — E1165 Type 2 diabetes mellitus with hyperglycemia: Secondary | ICD-10-CM | POA: Diagnosis not present

## 2015-04-09 DIAGNOSIS — G894 Chronic pain syndrome: Secondary | ICD-10-CM | POA: Diagnosis not present

## 2015-04-09 DIAGNOSIS — Z1389 Encounter for screening for other disorder: Secondary | ICD-10-CM | POA: Diagnosis not present

## 2015-04-10 ENCOUNTER — Other Ambulatory Visit: Payer: Self-pay | Admitting: Pharmacist

## 2015-04-10 NOTE — Patient Outreach (Signed)
Called to follow up with [name] to see how his toe is healing and check to see if he has any further medication questions. Patient does not answer and his voicemail box is full. Left a HIPAA compliant message on the patient's voicemail. If have not heard from patient by next week, will give him another call at that time.  Harlow Asa, PharmD Clinical Pharmacist [facility] [phone]

## 2015-04-15 ENCOUNTER — Telehealth: Payer: Self-pay | Admitting: Internal Medicine

## 2015-04-15 ENCOUNTER — Other Ambulatory Visit (HOSPITAL_COMMUNITY): Payer: Self-pay | Admitting: Interventional Radiology

## 2015-04-15 ENCOUNTER — Other Ambulatory Visit: Payer: Self-pay | Admitting: *Deleted

## 2015-04-15 ENCOUNTER — Other Ambulatory Visit: Payer: Self-pay | Admitting: Pharmacist

## 2015-04-15 DIAGNOSIS — E079 Disorder of thyroid, unspecified: Secondary | ICD-10-CM | POA: Diagnosis not present

## 2015-04-15 DIAGNOSIS — R600 Localized edema: Secondary | ICD-10-CM | POA: Diagnosis not present

## 2015-04-15 DIAGNOSIS — M86171 Other acute osteomyelitis, right ankle and foot: Secondary | ICD-10-CM | POA: Diagnosis not present

## 2015-04-15 DIAGNOSIS — Z8719 Personal history of other diseases of the digestive system: Secondary | ICD-10-CM | POA: Diagnosis not present

## 2015-04-15 DIAGNOSIS — I739 Peripheral vascular disease, unspecified: Secondary | ICD-10-CM

## 2015-04-15 DIAGNOSIS — E11621 Type 2 diabetes mellitus with foot ulcer: Secondary | ICD-10-CM | POA: Diagnosis not present

## 2015-04-15 MED ORDER — APIXABAN 5 MG PO TABS: 5.0000 mg | ORAL_TABLET | Freq: Two times a day (BID) | ORAL | Status: DC

## 2015-04-15 NOTE — Telephone Encounter (Signed)
°  1. Which medications need to be refilled? Eliquis 5 mg   2. [facility]   3. Do they need a 30 day or 90 day supply? 30  4. Would they like a call back once the medication has been sent to the pharmacy? Yes

## 2015-04-15 NOTE — Patient Outreach (Signed)
Call received from patient.  Patient called RNCM by mistake, was trying to reach cardiologist. [facility] assisted patient with number to Dr. Debara Pickett. Patient reports he was wanting to know when he ws going to be scheduled for stent placement, states his sister was going to call but he has not heard anything. RNCM requested patient call back if he is unable to get issue resolved, he agrees. Plan to make outreach visit in September. Royetta Crochet. Laymond Purser, RN, BSN, [place] [phone]

## 2015-04-15 NOTE — Patient Outreach (Signed)
Called to follow up with [name] about the healing of his toe and his medication adherence. [name] reports that his toe is healing "little by little", but that he has now been referred to a wound specialist in [place].  Patient reports that he is continuing to take his antibiotic. Reports that he always remembers to take his medications twice a day because of his medication alarm. Reports that even if he is asleep in the other room, he hears the alarm and it wakes him up so that he doesn't forget.  Reports that he is currently thinking about moving to [facility], where he would live about 6 minutes away from each of his sisters.  Patient states that he has no medication questions for me at this time. Verify that [name] has my contact information. Let the patient know that I will stop following him for now, but to call me if he has medication questions in the future.  Harlow Asa, PharmD Clinical Pharmacist [facility] [phone]

## 2015-04-21 ENCOUNTER — Telehealth: Payer: Self-pay | Admitting: *Deleted

## 2015-04-21 NOTE — Telephone Encounter (Signed)
Submitted PA for eliquis via [email or web address]

## 2015-04-22 ENCOUNTER — Other Ambulatory Visit: Payer: Self-pay | Admitting: Radiology

## 2015-04-22 ENCOUNTER — Ambulatory Visit (HOSPITAL_COMMUNITY): Payer: Medicare Other | Attending: Orthopaedic Surgery | Admitting: Physical Therapy

## 2015-04-22 DIAGNOSIS — X58XXXD Exposure to other specified factors, subsequent encounter: Secondary | ICD-10-CM | POA: Diagnosis not present

## 2015-04-22 DIAGNOSIS — R2681 Unsteadiness on feet: Secondary | ICD-10-CM | POA: Insufficient documentation

## 2015-04-22 DIAGNOSIS — R262 Difficulty in walking, not elsewhere classified: Secondary | ICD-10-CM | POA: Insufficient documentation

## 2015-04-22 DIAGNOSIS — S91109D Unspecified open wound of unspecified toe(s) without damage to nail, subsequent encounter: Secondary | ICD-10-CM | POA: Diagnosis not present

## 2015-04-22 NOTE — Therapy (Signed)
[place], [name], [address] Phone: [phone]   Fax:  [phone]  Wound Care Evaluation  Patient Details  Name: [name] MRN: [id number] Date of Birth: 1945-09-02 Referring Provider:  Sanjuana Kava, MD  Encounter Date: 04/22/2015      PT End of Session - 04/22/15 1307    Visit Number 1   Number of Visits 24   Date for PT Re-Evaluation 05/20/15   Authorization Type [employer] Medicare    Authorization Time Period 04/22/15 to 06/21/05   Authorization - Visit Number 1   Authorization - Number of Visits 10   PT Start Time 1017   PT Stop Time 1059   PT Time Calculation (min) 42 min   Activity Tolerance Patient tolerated treatment well   Behavior During Therapy [facility] for tasks assessed/performed      Past Medical History  Diagnosis Date  . Hypertension   . Diabetes mellitus     type 2   . History of valve replacement 2010    [facility] Ease bioprosthetic 61mm  . Thyroid disease   . Stroke 2003    R-sided weakness & upper extremity swelling   . Coronary artery disease   . Dyslipidemia   . At risk for sudden cardiac death 06/10/13  . PAF (paroxysmal atrial fibrillation) 05/2013    converted with amiodarone to SR  . Chronic anticoagulation 05/2013    started  . S/P cardiac catheterization, Rt & Lt heart cath 06/05/2013 06-10-13  . S/P CABG x 3 2010  . GERD (gastroesophageal reflux disease)   . Cataract   . Chronic kidney disease     Patient reports he is seeing Kidney doctor in September    Past Surgical History  Procedure Laterality Date  . Coronary artery bypass graft  01/26/2009    LIMA to LAD, SVG to circumflex, SVG to PDA  . Aortic valve replacement  01/26/2009    [facility] Ease bioprosthetic 103mm valve  . Transthoracic echocardiogram  09/2011    grade 1 diastolic dysfunction; increasing valve gradient; calcified MV annulus   . Cardiac catheterization  06/05/2013    native 3  vessel disease; patent LIMA to LAD, SVG to OM and SVG to PDA; Elevated right and left heart filling pressures, with predominantly pulmonary venous hypertension, normal PVR  . Transthoracic echocardiogram  06/03/2013    EF 25-30%, mod conc. hypertrophy, grade 3 diastolic dysfunction; LA mod dilated; calcified MV annulus; transaortic gradients are normal for the bioprosthetic valve; inf vena cava dilated (elevated CVP) - LifeVest  . Left and right heart catheterization with coronary angiogram N/A 06/05/2013    Procedure: LEFT AND RIGHT HEART CATHETERIZATION WITH CORONARY ANGIOGRAM;  Surgeon: Leonie Man, MD;  Location: [facility] CATH LAB;  Service: Cardiovascular;  Laterality: N/A;  . Graft(s) angiogram  06/05/2013    Procedure: GRAFT(S) Cyril Loosen;  Surgeon: Leonie Man, MD;  Location: [facility] CATH LAB;  Service: Cardiovascular;;    There were no vitals filed for this visit.  Visit Diagnosis:  Open toe wound, subsequent encounter - Plan: PT plan of care cert/re-cert  Difficulty walking - Plan: PT plan of care cert/re-cert  Unsteadiness - Plan: PT plan of care cert/re-cert      Subjective Assessment - 04/22/15 1218    Subjective Patient reports he isn't having any pain and that he has been doing what his MD told him to do for the wound (soap and water, salt water)  Pertinent History Patient reports that this wound started as a small little brown spot on the end of his toe that just kept getting bigger; he thinks it started about 8 months ago. He reports that it is now slowly getting better but has been healing slow. He states  that it has been getting better.    Currently in Pain? No/denies           Wound Therapy - 04/22/15 1222    Subjective Patient reports that the wound on his toe just opened up about 8 months ago and just kept getting worse and worse. He reports that he has been doing what his MD told him, and that the wound is getting better but it is still being very slow to heal. No  pain. Reports he is on antibiotics.    Patient and Family Stated Goals wound to heal    Date of Onset --  8 months ago    Prior Treatments --  has been using soap and water, salt water    Pain Assessment No/denies pain   Evaluation and Treatment Procedures Explained to Patient/Family Yes   Evaluation and Treatment Procedures agreed to   Wound Properties Date First Assessed: 04/22/15 Time First Assessed: 1020 Wound Type: Other (Comment) , open wound   Location: Toe (Comment  which one) , R great toe   Location Orientation: Right Present on Admission: Yes   Dressing Type Gauze (Comment)   Dressing Changed Changed   Dressing Status Old drainage   Dressing Change Frequency Other (Comment)  patient has been changing it PRN    Site / Wound Assessment Granulation tissue;Yellow;Brown   % Wound base Red or Granulating 55%   % Wound base Yellow 40%   % Wound base Black 0%   % Wound base Other (Comment) 5%  brown    Peri-wound Assessment Other (Comment)  callous    Wound Length (cm) 1.5 cm  12 to 6   Wound Width (cm) 2.5 cm  3 to 9   Wound Depth (cm) 0.1 cm  0.1 most areas with 0.2 depth in one part of wound    Undermining (cm) --  approx 0.1 cm on L side of wound    Closure None   Drainage Amount Minimal   Drainage Description Serosanguineous   Treatment Cleansed;Debridement (Selective);Packing (Impregnated strip);Tape changed;Other (Comment)  hydrogel silver, gauze, tape   Selective Debridement - Location R great toe    Selective Debridement - Tools Used Forceps;Scalpel   Selective Debridement - Tissue Removed minimal    Wound Therapy - Clinical Statement Patient presents with chronic R great toe wound that he reports he has been regularly cleaning with soap and water. He reports that it has improved but is just not healing fast. Patient educated that healing is probably limited by uncontrolled DM, poor circulation; it is also possible that wound may be exacerbated by possible gait  mechanics as well. Patient not appropriate for compression tehrapy due to vascular impairment. At this time patient will benefit from skilled PT services to facilitate wound healing for 3x/week for approximately 8 weeks.    Wound Therapy - Functional Problem List non-healing wound, gait difficulty    Factors Delaying/Impairing Wound Healing Diabetes Mellitus;Altered sensation;Other (comment)   Wound Therapy - Frequency 3X / week   Wound Therapy - Current Recommendations Diabetic teaching;PT   Wound Plan silver and gauze; compression contraindicated    Dressing  gauze with silver hydrocell, tape    Decrease Necrotic  Tissue to 20%   Decrease Necrotic Tissue - Progress Goal set today   Increase Granulation Tissue to 80%   Increase Granulation Tissue - Progress Goal set today   Decrease Length/Width/Depth by (cm) 1.0   Decrease Length/Width/Depth - Progress Goal set today   Patient/Family will be able to  independently dress and inspect wound for signs of infection   Patient/Family Instruction Goal - Progress Goal set today   Additional Wound Therapy Goal Full wound healing    Additional Wound Therapy Goal - Progress Goal set today   Time For Goal Achievement Other (comment)  8 weeks                          PT Education - 05-02-15 1258    Education provided Yes   Education Details wound healing will be affected by uncontrolled DM, very poor circulation; prognosis, frequency/duration of PT; not to remove bandage unless it comes off on its own and then to do what MD told him until he returns to PT    Person(s) Educated Patient   Methods Explanation   Comprehension Verbalized understanding                     G-Codes - 2015-05-02 1310    Functional Assessment Tool Used Based on skilled clinical assessment of wound healing status, gait/mobility    Functional Limitation Other PT primary   Other PT Primary Current Status (R7408) At least 60 percent but less than  80 percent impaired, limited or restricted   Other PT Primary Goal Status (X4481) At least 40 percent but less than 60 percent impaired, limited or restricted      Problem List Patient Active Problem List   Diagnosis Date Noted  . Critical lower limb ischemia   . Toe ulcer, right   . Type II or unspecified type diabetes mellitus without mention of complication, uncontrolled 11/27/2013  . Acute respiratory failure with hypoxia 11/26/2013  . CAP (community acquired pneumonia) 11/26/2013  . CHF exacerbation 11/25/2013  . Cardiomyopathy, ischemic 06/13/2013  . At risk for sudden cardiac death June 17, 2013  . S/P cardiac catheterization, Rt & Lt heart cath 06/05/2013 06-17-2013  . Acute on chronic combined systolic and diastolic HF (heart failure), NYHA class 3 06/04/2013  . Atrial flutter with rapid ventricular response 06/02/2013  . Acute renal failure: Cr up to 1.8 06/02/2013  . Hyperkalemia 06/02/2013    Class: Acute  . LBBB (left bundle branch block) 06/01/2013  . Hypotension- secondary to AF and Diltiazem Rx 06/01/2013  . Obesity (BMI 30-39.9) 06/01/2013  . Acute on chronic diastolic heart failure - due to Afib AVR 06/01/2013  . Atrial fibrillation with RVR- converted with Amiodarone 05/31/2013  . Stroke- Lt brain 2003 04/08/2013  . Hemiplegia affecting right dominant side 04/08/2013  . Cataract 04/08/2013  . GERD (gastroesophageal reflux disease) 04/08/2013  . S/P CABG x 3 - 2010 04/08/2013  . S/P AVR-tissue, 2010 04/08/2013  . DM2 (diabetes mellitus, type 2) 04/08/2013  . Dyslipidemia 04/08/2013  . SUBACROMIAL BURSITIS, LEFT 11/05/2009    Deniece Ree PT, DPT [place] [address] [place], [name], [address] Phone: [phone]   Fax:  [phone]

## 2015-04-22 NOTE — Telephone Encounter (Signed)
PA for eliquis approved under medicare part D until 04/20/2016

## 2015-04-23 ENCOUNTER — Other Ambulatory Visit (HOSPITAL_COMMUNITY): Payer: Self-pay | Admitting: Interventional Radiology

## 2015-04-23 ENCOUNTER — Encounter (HOSPITAL_COMMUNITY): Payer: Self-pay

## 2015-04-23 ENCOUNTER — Observation Stay (HOSPITAL_COMMUNITY)
Admission: RE | Admit: 2015-04-23 | Discharge: 2015-04-24 | Disposition: A | Discharge status: Home / Self Care | Payer: Medicare Other | Source: Ambulatory Visit | Attending: Interventional Radiology | Admitting: Interventional Radiology

## 2015-04-23 VITALS — BP 157/54 | HR 70 | Temp 97.7°F | Resp 20 | Ht 70.0 in | Wt 250.0 lb

## 2015-04-23 DIAGNOSIS — Z951 Presence of aortocoronary bypass graft: Secondary | ICD-10-CM | POA: Diagnosis not present

## 2015-04-23 DIAGNOSIS — Z794 Long term (current) use of insulin: Secondary | ICD-10-CM | POA: Insufficient documentation

## 2015-04-23 DIAGNOSIS — E785 Hyperlipidemia, unspecified: Secondary | ICD-10-CM | POA: Diagnosis not present

## 2015-04-23 DIAGNOSIS — Z8673 Personal history of transient ischemic attack (TIA), and cerebral infarction without residual deficits: Secondary | ICD-10-CM | POA: Insufficient documentation

## 2015-04-23 DIAGNOSIS — I739 Peripheral vascular disease, unspecified: Secondary | ICD-10-CM

## 2015-04-23 DIAGNOSIS — I129 Hypertensive chronic kidney disease with stage 1 through stage 4 chronic kidney disease, or unspecified chronic kidney disease: Secondary | ICD-10-CM | POA: Diagnosis not present

## 2015-04-23 DIAGNOSIS — I771 Stricture of artery: Secondary | ICD-10-CM | POA: Diagnosis not present

## 2015-04-23 DIAGNOSIS — L97519 Non-pressure chronic ulcer of other part of right foot with unspecified severity: Secondary | ICD-10-CM | POA: Diagnosis not present

## 2015-04-23 DIAGNOSIS — N189 Chronic kidney disease, unspecified: Secondary | ICD-10-CM | POA: Diagnosis not present

## 2015-04-23 DIAGNOSIS — I779 Disorder of arteries and arterioles, unspecified: Secondary | ICD-10-CM | POA: Diagnosis present

## 2015-04-23 DIAGNOSIS — Z87891 Personal history of nicotine dependence: Secondary | ICD-10-CM | POA: Insufficient documentation

## 2015-04-23 DIAGNOSIS — E119 Type 2 diabetes mellitus without complications: Secondary | ICD-10-CM | POA: Insufficient documentation

## 2015-04-23 DIAGNOSIS — Z9862 Peripheral vascular angioplasty status: Secondary | ICD-10-CM

## 2015-04-23 DIAGNOSIS — I2510 Atherosclerotic heart disease of native coronary artery without angina pectoris: Secondary | ICD-10-CM | POA: Diagnosis not present

## 2015-04-23 DIAGNOSIS — I70235 Atherosclerosis of native arteries of right leg with ulceration of other part of foot: Secondary | ICD-10-CM | POA: Diagnosis not present

## 2015-04-23 LAB — CBC WITH DIFFERENTIAL/PLATELET
BASOS ABS: 0.1 10*3/uL (ref 0.0–0.1)
Basophils Relative: 1 % (ref 0–1)
Eosinophils Absolute: 0.2 10*3/uL (ref 0.0–0.7)
Eosinophils Relative: 3 % (ref 0–5)
HEMATOCRIT: 41.5 % (ref 39.0–52.0)
HEMOGLOBIN: 13.8 g/dL (ref 13.0–17.0)
LYMPHS PCT: 26 % (ref 12–46)
Lymphs Abs: 1.8 10*3/uL (ref 0.7–4.0)
MCH: 27 pg (ref 26.0–34.0)
MCHC: 33.3 g/dL (ref 30.0–36.0)
MCV: 81.2 fL (ref 78.0–100.0)
MONO ABS: 0.8 10*3/uL (ref 0.1–1.0)
Monocytes Relative: 12 % (ref 3–12)
NEUTROS ABS: 4.1 10*3/uL (ref 1.7–7.7)
Neutrophils Relative %: 59 % (ref 43–77)
Platelets: 160 10*3/uL (ref 150–400)
RBC: 5.11 MIL/uL (ref 4.22–5.81)
RDW: 17.4 % — AB (ref 11.5–15.5)
WBC: 7 10*3/uL (ref 4.0–10.5)

## 2015-04-23 LAB — BASIC METABOLIC PANEL
ANION GAP: 9 (ref 5–15)
BUN: 21 mg/dL — ABNORMAL HIGH (ref 6–20)
CO2: 26 mmol/L (ref 22–32)
Calcium: 9.6 mg/dL (ref 8.9–10.3)
Chloride: 103 mmol/L (ref 101–111)
Creatinine, Ser: 1.35 mg/dL — ABNORMAL HIGH (ref 0.61–1.24)
GFR calc Af Amer: >60 mL/min (ref >60)
GFR, EST NON AFRICAN AMERICAN: 53 mL/min — AB (ref >60)
Glucose, Bld: 277 mg/dL — ABNORMAL HIGH (ref 65–99)
POTASSIUM: 4.7 mmol/L (ref 3.5–5.1)
Sodium: 138 mmol/L (ref 135–145)

## 2015-04-23 LAB — POCT ACTIVATED CLOTTING TIME: Activated Clotting Time: 122 seconds

## 2015-04-23 LAB — GLUCOSE, CAPILLARY: GLUCOSE-CAPILLARY: 310 mg/dL — AB (ref 65–99)

## 2015-04-23 LAB — PROTIME-INR
INR: 1.07 (ref 0.00–1.49)
Prothrombin Time: 14.1 seconds (ref 11.6–15.2)

## 2015-04-23 MED ORDER — SODIUM CHLORIDE 0.9 % IV SOLN
INTRAVENOUS | Status: AC | PRN
  Administered 2015-04-23: 10 mL/h via INTRAVENOUS

## 2015-04-23 MED ORDER — HEPARIN SODIUM (PORCINE) 1000 UNIT/ML IJ SOLN
INTRAMUSCULAR | Status: AC
  Filled 2015-04-23: qty 1

## 2015-04-23 MED ORDER — LIDOCAINE HCL 1 % IJ SOLN
INTRAMUSCULAR | Status: AC
  Filled 2015-04-23: qty 20

## 2015-04-23 MED ORDER — DOCUSATE SODIUM 100 MG PO CAPS
100.0000 mg | ORAL_CAPSULE | Freq: Two times a day (BID) | ORAL | Status: DC
  Administered 2015-04-23 – 2015-04-24 (×2): 100 mg via ORAL
  Filled 2015-04-23 (×2): qty 1

## 2015-04-23 MED ORDER — IODIXANOL 320 MG/ML IV SOLN
150.0000 mL | Freq: Once | INTRAVENOUS | Status: DC | PRN
  Administered 2015-04-23: 100 mL via INTRAVENOUS
  Filled 2015-04-23: qty 150

## 2015-04-23 MED ORDER — BISMUTH SUBSALICYLATE 262 MG/15ML PO SUSP
30.0000 mL | Freq: Four times a day (QID) | ORAL | Status: DC | PRN
  Filled 2015-04-23: qty 118

## 2015-04-23 MED ORDER — METOPROLOL SUCCINATE ER 25 MG PO TB24
25.0000 mg | ORAL_TABLET | Freq: Every day | ORAL | Status: DC
  Administered 2015-04-24: 25 mg via ORAL
  Filled 2015-04-23: qty 1

## 2015-04-23 MED ORDER — MIDAZOLAM HCL 2 MG/2ML IJ SOLN
INTRAMUSCULAR | Status: AC
  Filled 2015-04-23: qty 2

## 2015-04-23 MED ORDER — IODIXANOL 320 MG/ML IV SOLN: 150.0000 mL | Freq: Once | INTRAVENOUS | Status: DC | PRN

## 2015-04-23 MED ORDER — GABAPENTIN 300 MG PO CAPS
300.0000 mg | ORAL_CAPSULE | Freq: Two times a day (BID) | ORAL | Status: DC
  Administered 2015-04-23 – 2015-04-24 (×2): 300 mg via ORAL
  Filled 2015-04-23 (×2): qty 1

## 2015-04-23 MED ORDER — PANTOPRAZOLE SODIUM 40 MG PO TBEC
40.0000 mg | DELAYED_RELEASE_TABLET | Freq: Two times a day (BID) | ORAL | Status: DC
  Administered 2015-04-23 – 2015-04-24 (×2): 40 mg via ORAL
  Filled 2015-04-23 (×2): qty 1

## 2015-04-23 MED ORDER — CEFAZOLIN SODIUM-DEXTROSE 2-3 GM-% IV SOLR
INTRAVENOUS | Status: AC
  Administered 2015-04-23: 2000 mg
  Filled 2015-04-23: qty 50

## 2015-04-23 MED ORDER — FENTANYL CITRATE (PF) 100 MCG/2ML IJ SOLN
INTRAMUSCULAR | Status: AC
  Filled 2015-04-23: qty 2

## 2015-04-23 MED ORDER — HEPARIN SODIUM (PORCINE) 1000 UNIT/ML IJ SOLN
INTRAMUSCULAR | Status: AC | PRN
  Administered 2015-04-23: 1000 [IU] via INTRAVENOUS
  Administered 2015-04-23: 8000 [IU] via INTRAVENOUS

## 2015-04-23 MED ORDER — MIDAZOLAM HCL 2 MG/2ML IJ SOLN
INTRAMUSCULAR | Status: AC
  Filled 2015-04-23: qty 4

## 2015-04-23 MED ORDER — SODIUM CHLORIDE 0.9 % IV SOLN
INTRAVENOUS | Status: DC
  Administered 2015-04-23: 10:00:00 via INTRAVENOUS

## 2015-04-23 MED ORDER — CLOPIDOGREL BISULFATE 75 MG PO TABS
300.0000 mg | ORAL_TABLET | Freq: Every day | ORAL | Status: DC
  Filled 2015-04-23: qty 4

## 2015-04-23 MED ORDER — INSULIN ASPART 100 UNIT/ML ~~LOC~~ SOLN
0.0000 [IU] | Freq: Three times a day (TID) | SUBCUTANEOUS | Status: DC
  Administered 2015-04-24 (×2): 11 [IU] via SUBCUTANEOUS

## 2015-04-23 MED ORDER — CEFAZOLIN SODIUM-DEXTROSE 2-3 GM-% IV SOLR: 2.0000 g | INTRAVENOUS | Status: DC

## 2015-04-23 MED ORDER — HYDROCODONE-ACETAMINOPHEN 5-325 MG PO TABS: 1.0000 | ORAL_TABLET | ORAL | Status: DC | PRN

## 2015-04-23 MED ORDER — MIDAZOLAM HCL 2 MG/2ML IJ SOLN
INTRAMUSCULAR | Status: AC | PRN
  Administered 2015-04-23 (×10): 0.5 mg via INTRAVENOUS
  Administered 2015-04-23: 1 mg via INTRAVENOUS

## 2015-04-23 MED ORDER — FENOFIBRATE 160 MG PO TABS
160.0000 mg | ORAL_TABLET | Freq: Every day | ORAL | Status: DC
  Administered 2015-04-24: 160 mg via ORAL
  Filled 2015-04-23: qty 1

## 2015-04-23 MED ORDER — LEVOTHYROXINE SODIUM 25 MCG PO TABS
25.0000 ug | ORAL_TABLET | Freq: Every day | ORAL | Status: DC
  Administered 2015-04-24: 25 ug via ORAL
  Filled 2015-04-23: qty 1

## 2015-04-23 MED ORDER — ALPRAZOLAM 0.5 MG PO TABS: 1.0000 mg | ORAL_TABLET | Freq: Four times a day (QID) | ORAL | Status: DC | PRN

## 2015-04-23 MED ORDER — FENTANYL CITRATE (PF) 100 MCG/2ML IJ SOLN
INTRAMUSCULAR | Status: AC | PRN
  Administered 2015-04-23 (×3): 25 ug via INTRAVENOUS
  Administered 2015-04-23: 12.5 ug via INTRAVENOUS
  Administered 2015-04-23: 25 ug via INTRAVENOUS
  Administered 2015-04-23 (×2): 12.5 ug via INTRAVENOUS
  Administered 2015-04-23: 25 ug via INTRAVENOUS
  Administered 2015-04-23: 12.5 ug via INTRAVENOUS
  Administered 2015-04-23: 50 ug via INTRAVENOUS
  Administered 2015-04-23: 25 ug via INTRAVENOUS
  Administered 2015-04-23: 12.5 ug via INTRAVENOUS
  Administered 2015-04-23: 25 ug via INTRAVENOUS

## 2015-04-23 MED ORDER — ACETAMINOPHEN 500 MG PO TABS
1000.0000 mg | ORAL_TABLET | Freq: Four times a day (QID) | ORAL | Status: DC | PRN
  Administered 2015-04-23: 1000 mg via ORAL
  Filled 2015-04-23: qty 2

## 2015-04-23 MED ORDER — NITROGLYCERIN 1 MG/10 ML FOR IR/CATH LAB
INTRA_ARTERIAL | Status: AC
  Filled 2015-04-23: qty 10

## 2015-04-23 MED ORDER — FENTANYL CITRATE (PF) 100 MCG/2ML IJ SOLN
INTRAMUSCULAR | Status: AC
  Filled 2015-04-23: qty 4

## 2015-04-23 MED ORDER — SODIUM CHLORIDE 0.9 % IV SOLN
INTRAVENOUS | Status: AC
  Administered 2015-04-23: 21:00:00 via INTRAVENOUS

## 2015-04-23 MED ORDER — DOXYCYCLINE HYCLATE 100 MG PO TABS
100.0000 mg | ORAL_TABLET | Freq: Two times a day (BID) | ORAL | Status: DC
  Administered 2015-04-23 – 2015-04-24 (×2): 100 mg via ORAL
  Filled 2015-04-23 (×2): qty 1

## 2015-04-23 NOTE — Sedation Documentation (Signed)
[facility] notified of 30 min since redose of heparin

## 2015-04-23 NOTE — Sedation Documentation (Signed)
Dr Laurence Ferrari notified of 30 min after heparin

## 2015-04-23 NOTE — Sedation Documentation (Signed)
Patient is resting comfortably. 

## 2015-04-23 NOTE — Sedation Documentation (Signed)
Called bed placement bed not available at this time

## 2015-04-23 NOTE — Sedation Documentation (Addendum)
Dr Laurence Ferrari notified of  Last heparin dose

## 2015-04-23 NOTE — H&P (Signed)
Chief Complaint: Patient was seen in consultation today for Rt great toe nonhealing ulcer at the request of Dr Felicie Morn  Referring Physician(s): Dr Felicie Morn  History of Present Illness: [name] is a 69 y.o. male   Pt with long hx DM Rt great toe ulcer slow healing for months Evaluation with Dr Romona Curls US Arterial study 03/02/15: IMPRESSION: Resting ankle brachial index on the right demonstrates borderline severe arterial occlusive disease. Abnormal waveform of the posterior tibial artery and dorsalis pedis suggesting at least tibial disease. Involvement of more proximal segments cannot be determined on this study.   Resting ankle-brachial index on the left demonstrates moderate arterial occlusive disease. This value may decrease after exercise. Monophasic waveform of the posterior tibial artery and dorsalis pedis suggesting at least tibial disease. Involvement of more proximal segments cannot be determined on this study.  CTA 03/25/15: IMPRESSION: No significant aorto or iliac inflow stenosis. Atherosclerotic calcifications are noted with diminutive vessels.  Right lower extremity runoff demonstrates long segment occlusion of the right superficial femoral artery. The popliteal artery reconstitutes above the knee joint. Tibial vessels are obscured by poor contrast opacification.  In the left lower extremity, the common femoral artery and proximal superficial femoral artery are grossly patent. I suspect significant stenosis of the distal left superficial femoral artery. The popliteal and tibial arteries are obscured by poor contrast Opacification.  Referred to IR for evaluation and possible treatment Was consulted with Dr Laurence Ferrari 03/26/15 Now scheduled for Rt femoral arteriogram with possible angioplasty/stent Rt superficial femoral artery LD Eliquis 04/19/15  Past Medical History  Diagnosis Date  . Hypertension   . Diabetes mellitus       type 2   . History of valve replacement 2010    [facility] Ease bioprosthetic 47mm  . Thyroid disease   . Stroke 2003    R-sided weakness & upper extremity swelling   . Coronary artery disease   . Dyslipidemia   . At risk for sudden cardiac death 07/06/13  . PAF (paroxysmal atrial fibrillation) 05/2013    converted with amiodarone to SR  . Chronic anticoagulation 05/2013    started  . S/P cardiac catheterization, Rt & Lt heart cath 06/05/2013 06-Jul-2013  . S/P CABG x 3 2010  . GERD (gastroesophageal reflux disease)   . Cataract   . Chronic kidney disease     Patient reports he is seeing Kidney doctor in September    Past Surgical History  Procedure Laterality Date  . Coronary artery bypass graft  01/26/2009    LIMA to LAD, SVG to circumflex, SVG to PDA  . Aortic valve replacement  01/26/2009    [facility] Ease bioprosthetic 42mm valve  . Transthoracic echocardiogram  09/2011    grade 1 diastolic dysfunction; increasing valve gradient; calcified MV annulus   . Cardiac catheterization  06/05/2013    native 3 vessel disease; patent LIMA to LAD, SVG to OM and SVG to PDA; Elevated right and left heart filling pressures, with predominantly pulmonary venous hypertension, normal PVR  . Transthoracic echocardiogram  06/03/2013    EF 25-30%, mod conc. hypertrophy, grade 3 diastolic dysfunction; LA mod dilated; calcified MV annulus; transaortic gradients are normal for the bioprosthetic valve; inf vena cava dilated (elevated CVP) - LifeVest  . Left and right heart catheterization with coronary angiogram N/A 06/05/2013    Procedure: LEFT AND RIGHT HEART CATHETERIZATION WITH CORONARY ANGIOGRAM;  Surgeon: Leonie Man, MD;  Location: [facility] CATH LAB;  Service:  Cardiovascular;  Laterality: N/A;  . Graft(s) angiogram  06/05/2013    Procedure: GRAFT(S) Cyril Loosen;  Surgeon: Leonie Man, MD;  Location: [facility] CATH LAB;  Service: Cardiovascular;;    Allergies: Review of patient's  allergies indicates no known allergies.  Medications: Prior to Admission medications   Medication Sig Start Date End Date Taking? Authorizing Provider  acetaminophen (TYLENOL) 500 MG tablet Take 1,000 mg by mouth every 6 (six) hours as needed. For pain/headaches    Yes Historical Provider, MD  ALPRAZolam Duanne Moron) 1 MG tablet Take 1 mg by mouth 4 (four) times daily as needed for anxiety.    Yes Historical Provider, MD  amiodarone (PACERONE) 200 MG tablet Take 1 tablet (200 mg total) by mouth daily. 10/30/14  Yes Pixie Casino, MD  apixaban (ELIQUIS) 5 MG TABS tablet Take 1 tablet (5 mg total) by mouth 2 (two) times daily. 04/15/15  Yes Pixie Casino, MD  atorvastatin (LIPITOR) 20 MG tablet Take 1 tablet (20 mg total) by mouth daily at 6 PM. 10/30/14  Yes Pixie Casino, MD  bismuth subsalicylate (PEPTO BISMOL) 262 MG/15ML suspension Take 30 mLs by mouth every 6 (six) hours as needed for indigestion.   Yes Historical Provider, MD  celecoxib (CELEBREX) 200 MG capsule Take 1 capsule by mouth 2 (two) times daily. 04/07/14  Yes Historical Provider, MD  docusate sodium (COLACE) 100 MG capsule Take 100 mg by mouth 2 (two) times daily.     Yes Historical Provider, MD  doxycycline (VIBRA-TABS) 100 MG tablet Take 100 mg by mouth 2 (two) times daily.   Yes Historical Provider, MD  fenofibrate (TRICOR) 145 MG tablet Take 1 tablet (145 mg total) by mouth daily. 10/30/14  Yes Pixie Casino, MD  ferrous sulfate 325 (65 FE) MG EC tablet Take 325 mg by mouth every morning.   Yes Historical Provider, MD  gabapentin (NEURONTIN) 300 MG capsule Take 300 mg by mouth 2 (two) times daily.    Yes Historical Provider, MD  HYDROcodone-acetaminophen (NORCO) 10-325 MG per tablet Take 1 tablet by mouth as needed for severe pain.  03/31/14  Yes Historical Provider, MD  insulin glargine (LANTUS) 100 UNIT/ML injection Inject 60-70 Units into the skin 2 (two) times daily. Takes70 units in the morning and 60 units in the evening    Yes Historical Provider, MD  insulin lispro (HUMALOG) 100 UNIT/ML injection Inject 50-60 Units into the skin 3 (three) times daily before meals. 50U BID and 60U QHS   Yes Historical Provider, MD  levothyroxine (SYNTHROID, LEVOTHROID) 25 MCG tablet Take 25 mcg by mouth daily.     Yes Historical Provider, MD  metoprolol succinate (TOPROL-XL) 25 MG 24 hr tablet Take 1 tablet (25 mg total) by mouth daily. 10/30/14  Yes Pixie Casino, MD  Omega-3 Fatty Acids (FISH OIL PO) Take 1 capsule by mouth 2 (two) times daily.    Yes Historical Provider, MD  pantoprazole (PROTONIX) 40 MG tablet Take 1 tablet (40 mg total) by mouth 2 (two) times daily. 10/30/14  Yes Pixie Casino, MD  silver sulfADIAZINE (SILVADENE) 1 % cream Apply 1 application topically 4 (four) times daily.   Yes Historical Provider, MD  torsemide (DEMADEX) 10 MG tablet TAKE ONE (1) TABLET EACH DAY 03/13/15  Yes Pixie Casino, MD     Family History  Problem Relation Age of Onset  . Heart attack Mother   . Liver disease Brother   . Diabetes Sister     x4  Social History   Social History  . Marital Status: Divorced    Spouse Name: N/A  . Number of Children: 4  . Years of Education: N/A   Occupational History  . 4    Social History Main Topics  . Smoking status: Former Smoker    Types: Cigarettes    Quit date: 08/15/2001  . Smokeless tobacco: Former Systems developer    Types: Chew  . Alcohol Use: No  . Drug Use: No  . Sexual Activity: Not Currently   Other Topics Concern  . None   Social History Narrative     Review of Systems: A 12 point ROS discussed and pertinent positives are indicated in the HPI above.  All other systems are negative.  Review of Systems  Constitutional: Negative for fever, activity change, appetite change and fatigue.  Respiratory: Negative for shortness of breath.   Cardiovascular: Negative for chest pain.  Gastrointestinal: Negative for abdominal pain.  Musculoskeletal: Negative for back pain.   Neurological: Negative for weakness.  Psychiatric/Behavioral: Negative for behavioral problems and confusion.    Vital Signs: BP 178/56 mmHg  Pulse 65  Temp(Src) 98.2 F (36.8 C) (Oral)  Resp 18  Ht 5\' 10"  (1.778 m)  Wt 250 lb (113.399 kg)  BMI 35.87 kg/m2  SpO2 95%  Physical Exam  Constitutional: He is oriented to person, place, and time. He appears well-nourished.  Cardiovascular: Regular rhythm.   Murmur heard. Pulmonary/Chest: Effort normal and breath sounds normal. He has no wheezes.  Abdominal: Soft. Bowel sounds are normal. There is no tenderness.  Musculoskeletal: Normal range of motion. He exhibits tenderness.  Rt toe pain Rt hand without much use--Hx CVA  Neurological: He is alert and oriented to person, place, and time.  Skin: Skin is warm and dry.  Psychiatric: He has a normal mood and affect. His behavior is normal. Judgment and thought content normal.  Nursing note and vitals reviewed.   Mallampati Score:  MD Evaluation Airway: WNL Heart: WNL Abdomen: WNL Chest/ Lungs: WNL ASA  Classification: 3  Imaging: Ct Angio Ao+bifem W/cm &/or Wo/cm  03/25/2015   CLINICAL DATA:  Right great toe ulcer  EXAM: CT ANGIOGRAPHY OF ABDOMINAL AORTA WITH ILIOFEMORAL RUNOFF  TECHNIQUE: Multidetector CT imaging of the abdomen, pelvis and lower extremities was performed using the standard protocol during bolus administration of intravenous contrast. Multiplanar CT image reconstructions and MIPs were obtained to evaluate the vascular anatomy.  CONTRAST:  162mL OMNIPAQUE IOHEXOL 350 MG/ML SOLN  COMPARISON:  None.  FINDINGS: The aorta is non aneurysmal and patent with mild diffuse atherosclerotic calcifications.  Celiac axis is patent. Branch vessels are patent and non aneurysmal.  SMA is patent. Mild diffuse atherosclerotic changes with some plaque in the mid SMA is noted.  Single renal arteries are patent and non aneurysmal.  IMA origin is patent.  Branch vessels are diminutive and  patent.  Right common and external iliac arteries are patent. Moderate diffuse atherosclerotic calcifications. These vessels are diminutive. Right internal iliac artery is moderately diseased.  Left common iliac and external iliac arteries are patent. Similarly, they are diminutive with moderate atherosclerotic calcifications.  Right common femoral artery is patent with atherosclerotic calcifications. The right superficial femoral artery is occluded at its origin. Profunda femoral artery branches are patent. The superficial femoral artery reconstitutes in the adductor canal. Popliteal artery is grossly patent. There is poor opacification in the tibial vessels.  Left common femoral artery and profunda femoral arteries are patent. There is poor opacification of  the femoral arterial system. There is mild diffuse disease involving the proximal and mid left superficial femoral artery. I suspect that there is severe disease in the distal left superficial femoral artery with significant stenoses in the adductor canal. The popliteal artery faintly reconstitutes above the knee. The popliteal artery at the level of the knee and tibial vessels are obscured.  The left lobe of the liver is enlarged. The contour of the liver is slightly nodular. These findings suggest early cirrhotic change. No liver mass  Gallstones.  Spleen, pancreas, adrenal glands are within normal limits.  Simple cyst in left kidney.  Mild chronic changes of the kidneys.  Normal appendix. Bladder and prostate are within normal limits. Unremarkable sigmoid colon  No free-fluid.  No abnormal retroperitoneal adenopathy.  L5-S1 degenerative disc disease is present with posterior disc osteophytes which encroach upon the left S1 nerve root sleeve. No vertebral compression deformity.  Left inguinal hernia contains adipose tissue.  Review of the MIP images confirms the above findings.  IMPRESSION: No significant aorto or iliac inflow stenosis. Atherosclerotic  calcifications are noted with diminutive vessels.  Right lower extremity runoff demonstrates long segment occlusion of the right superficial femoral artery. The popliteal artery reconstitutes above the knee joint. Tibial vessels are obscured by poor contrast opacification.  In the left lower extremity, the common femoral artery and proximal superficial femoral artery are grossly patent. I suspect significant stenosis of the distal left superficial femoral artery. The popliteal and tibial arteries are obscured by poor contrast opacification.  Cholelithiasis.  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   Electronically Signed   By: Marybelle Killings M.D.   On: 03/25/2015 11:13    Labs:  CBC:  Recent Labs  04/23/15 1000  WBC 7.0  HGB 13.8  HCT 41.5  PLT 160    COAGS:  Recent Labs  04/23/15 1000  INR 1.07    BMP:  Recent Labs  03/18/15 0831 04/23/15 1000  NA  --  138  K  --  4.7  CL  --  103  CO2  --  26  GLUCOSE  --  277*  BUN 22 21*  CALCIUM  --  9.6  CREATININE 1.35* 1.35*  GFRNONAA 54* 53*  GFRAA 63 >60    LIVER FUNCTION TESTS: No results for input(s): BILITOT, AST, ALT, ALKPHOS, PROT, ALBUMIN in the last 8760 hours.  TUMOR MARKERS: No results for input(s): AFPTM, CEA, CA199, CHROMGRNA in the last 8760 hours.  Assessment and Plan:  Rt great toe ulcer- slow to no healing x months Evidence of Rt femoral stenosis on [place] and CTA Now scheduled for Rt femoral arteriogram with possible angioplasty/stent Rt superficial femoral artery Risks and Benefits discussed with the patient including, but not limited to bleeding, infection, vascular injury or contrast induced renal failure. All of the patient's questions were answered, patient is agreeable to proceed. Consent signed and in chart.  Thank you for this interesting consult.  I greatly enjoyed meeting Da[place] and look forward to participating in their care.  A copy of this report was sent to the requesting  provider on this date.  Signed: Tracie Lindbloom A 04/23/2015, 12:04 PM   I spent a total of  30 Minutes   in face to face in clinical consultation, greater than 50% of which was counseling/coordinating care for femoral arteriogram with poss pta/stent

## 2015-04-23 NOTE — Sedation Documentation (Signed)
Dr Laurence Ferrari notified of 60 min since heparin

## 2015-04-23 NOTE — Sedation Documentation (Signed)
C/o tenderness at groin site

## 2015-04-23 NOTE — Sedation Documentation (Signed)
Patient c/o pain, tenderness at site

## 2015-04-23 NOTE — Sedation Documentation (Signed)
C/o pain with balloon inflation

## 2015-04-23 NOTE — Procedures (Signed)
Interventional Radiology Procedure Note  Procedure:   1.) RLE angiogram 2.) Recanalization of right SFA occlusion  Access: LEFT 34F sheath, manual pressure  Complications:  0  Estimated Blood Loss: 0  Recommendations: - Bedrest x 6 hrs - Load plavix - Anticipate DC in am - Pt will need plavix for 3-6 months in addition to his Eliquis.  He can resume Eliquis tomorrow.   Signed,  Criselda Peaches, MD

## 2015-04-23 NOTE — Sedation Documentation (Signed)
Patient denies pain and is resting comfortably.  

## 2015-04-23 NOTE — Sedation Documentation (Signed)
Transferred via stretcher to [phone] with RN and RT groin site c/d/i

## 2015-04-23 NOTE — Sedation Documentation (Signed)
Dr Laurence Ferrari notified of time

## 2015-04-24 ENCOUNTER — Other Ambulatory Visit: Payer: Self-pay | Admitting: Radiology

## 2015-04-24 DIAGNOSIS — E119 Type 2 diabetes mellitus without complications: Secondary | ICD-10-CM | POA: Diagnosis not present

## 2015-04-24 DIAGNOSIS — I2510 Atherosclerotic heart disease of native coronary artery without angina pectoris: Secondary | ICD-10-CM | POA: Diagnosis not present

## 2015-04-24 DIAGNOSIS — Z794 Long term (current) use of insulin: Secondary | ICD-10-CM | POA: Diagnosis not present

## 2015-04-24 DIAGNOSIS — Z951 Presence of aortocoronary bypass graft: Secondary | ICD-10-CM | POA: Diagnosis not present

## 2015-04-24 DIAGNOSIS — I70235 Atherosclerosis of native arteries of right leg with ulceration of other part of foot: Secondary | ICD-10-CM | POA: Diagnosis not present

## 2015-04-24 DIAGNOSIS — I129 Hypertensive chronic kidney disease with stage 1 through stage 4 chronic kidney disease, or unspecified chronic kidney disease: Secondary | ICD-10-CM | POA: Diagnosis not present

## 2015-04-24 DIAGNOSIS — N189 Chronic kidney disease, unspecified: Secondary | ICD-10-CM | POA: Diagnosis not present

## 2015-04-24 DIAGNOSIS — Z8673 Personal history of transient ischemic attack (TIA), and cerebral infarction without residual deficits: Secondary | ICD-10-CM | POA: Diagnosis not present

## 2015-04-24 DIAGNOSIS — L97519 Non-pressure chronic ulcer of other part of right foot with unspecified severity: Secondary | ICD-10-CM | POA: Diagnosis not present

## 2015-04-24 DIAGNOSIS — I739 Peripheral vascular disease, unspecified: Secondary | ICD-10-CM

## 2015-04-24 DIAGNOSIS — Z87891 Personal history of nicotine dependence: Secondary | ICD-10-CM | POA: Diagnosis not present

## 2015-04-24 DIAGNOSIS — E785 Hyperlipidemia, unspecified: Secondary | ICD-10-CM | POA: Diagnosis not present

## 2015-04-24 LAB — GLUCOSE, CAPILLARY
GLUCOSE-CAPILLARY: 349 mg/dL — AB (ref 65–99)
Glucose-Capillary: 323 mg/dL — ABNORMAL HIGH (ref 65–99)

## 2015-04-24 MED ORDER — CLOPIDOGREL BISULFATE 75 MG PO TABS: 75.0000 mg | ORAL_TABLET | Freq: Every day | ORAL | Status: DC

## 2015-04-24 MED ORDER — APIXABAN 5 MG PO TABS: 5.0000 mg | ORAL_TABLET | Freq: Two times a day (BID) | ORAL | Status: DC

## 2015-04-24 MED ORDER — CLOPIDOGREL BISULFATE 75 MG PO TABS
75.0000 mg | ORAL_TABLET | Freq: Every day | ORAL | Status: DC
  Filled 2015-04-24: qty 1

## 2015-04-24 MED ORDER — CLOPIDOGREL BISULFATE 75 MG PO TABS: 300.0000 mg | ORAL_TABLET | Freq: Once | ORAL | Status: DC

## 2015-04-24 NOTE — Discharge Summary (Signed)
Patient ID: [name] MRN: [id number] DOB/AGE: July 29, 1946 69 y.o.  Admit date: 04/23/2015 Discharge date: 04/24/2015  Admission Diagnoses: Peripheral arterial occlusive disease  Discharge Diagnoses:  Active Problems:   S/P angioplasty   Peripheral arterial occlusive disease S/p  1.) RLE angiogram 2.) Recanalization of right SFA occlusion  Discharged Condition: Good, stable.  Hospital Course: Mr. Brabson is a 69 year old male with right great toe ulcer and evidence of peripheral vascular disease with abnormal ABI's. Patient with rutherford category 5 critical limb ischemia involving the right lower extremity. Imaging CTA runoff evaluation demonstrates a chronic occlusion of the right superficial femoral artery beginning just beyond the origin and extending at least into the mid thigh. Additionally, he likely has significant runoff disease as well as more distal femoral popliteal disease. He presented 9/8 for endovascular revascularization. He underwent successful RLE arteriogram with recanalization of right SFA occlusion with angioplasty and stent placement. He was admitted overnight for observation with no complications. Today, he states he is ready for discharge home. He denies any chest pain or shortness of breath. He denies any groin access site pain, bleeding or swelling. He states he has better sensation in his feet than prior to procedure. He denies any LE pain or coldness. He denies any active signs of bleeding. He has ate breakfast without nausea or vomiting. He has ambulated without difficulty. He has had his foley catheter removed without difficulty and has voided on his own. He will be discharged home today with Plavix Rx to take 75mg  daily with 3 refills. He will follow up in our IR clinic in 2-4 weeks with Dr. Laurence Ferrari and will have ABI's at that time. He was given the below instructions and verbalizes understanding.   Treatments: S/p  1.) RLE angiogram 2.)  Recanalization of right SFA occlusion  Discharge Exam: Blood pressure 152/51, pulse 70, temperature 98.5 F (36.9 C), temperature source Oral, resp. rate 20, height 5\' 10"  (1.778 m), weight 250 lb (113.399 kg), SpO2 95 %.  Physical Exam:  General: A&Ox3, NAD Heart: RRR Lungs: CTA b/l Abd: Soft, NT, ND, (+) BS Ext: LCFA access site dressing C/D/I, soft, NT, no signs of bleeding or hematoma, LE Warm b/l, DP doppler intact b/l, LE sensation intact b/l  Disposition: 06-Home-Health Care Svc  Discharge Instructions    Call MD for:  difficulty breathing, headache or visual disturbances    Complete by:  As directed      Call MD for:  extreme fatigue    Complete by:  As directed      Call MD for:  hives    Complete by:  As directed      Call MD for:  persistant dizziness or light-headedness    Complete by:  As directed      Call MD for:  persistant nausea and vomiting    Complete by:  As directed      Call MD for:  redness, tenderness, or signs of infection (pain, swelling, redness, odor or green/yellow discharge around incision site)    Complete by:  As directed      Call MD for:  severe uncontrolled pain    Complete by:  As directed      Call MD for:  temperature >100.4    Complete by:  As directed      Diet - low sodium heart healthy    Complete by:  As directed      Driving Restrictions    Complete by:  As directed   No driving x 3 days     Increase activity slowly    Complete by:  As directed      Lifting restrictions    Complete by:  As directed   No lifting over 20 lbs x 1 week     Remove dressing in 24 hours    Complete by:  As directed             Medication List    TAKE these medications        acetaminophen 500 MG tablet  Commonly known as:  TYLENOL  Take 1,000 mg by mouth every 6 (six) hours as needed. For pain/headaches     ALPRAZolam 1 MG tablet  Commonly known as:  XANAX  Take 1 mg by mouth 4 (four) times daily as needed for anxiety.     amiodarone  200 MG tablet  Commonly known as:  PACERONE  Take 1 tablet (200 mg total) by mouth daily.     apixaban 5 MG Tabs tablet  Commonly known as:  ELIQUIS  Take 1 tablet (5 mg total) by mouth 2 (two) times daily.     atorvastatin 20 MG tablet  Commonly known as:  LIPITOR  Take 1 tablet (20 mg total) by mouth daily at 6 PM.     bismuth subsalicylate 132 GM/01UU suspension  Commonly known as:  PEPTO BISMOL  Take 30 mLs by mouth every 6 (six) hours as needed for indigestion.     celecoxib 200 MG capsule  Commonly known as:  CELEBREX  Take 1 capsule by mouth 2 (two) times daily.     clopidogrel 75 MG tablet  Commonly known as:  PLAVIX  Take 1 tablet (75 mg total) by mouth daily.  Start taking on:  04/25/2015     docusate sodium 100 MG capsule  Commonly known as:  COLACE  Take 100 mg by mouth 2 (two) times daily.     doxycycline 100 MG tablet  Commonly known as:  VIBRA-TABS  Take 100 mg by mouth 2 (two) times daily.     fenofibrate 145 MG tablet  Commonly known as:  TRICOR  Take 1 tablet (145 mg total) by mouth daily.     ferrous sulfate 325 (65 FE) MG EC tablet  Take 325 mg by mouth every morning.     FISH OIL PO  Take 1 capsule by mouth 2 (two) times daily.     gabapentin 300 MG capsule  Commonly known as:  NEURONTIN  Take 300 mg by mouth 2 (two) times daily.     HYDROcodone-acetaminophen 10-325 MG per tablet  Commonly known as:  NORCO  Take 1 tablet by mouth as needed for severe pain.     insulin glargine 100 UNIT/ML injection  Commonly known as:  LANTUS  Inject 60-70 Units into the skin 2 (two) times daily. Takes70 units in the morning and 60 units in the evening     insulin lispro 100 UNIT/ML injection  Commonly known as:  HUMALOG  Inject 50-60 Units into the skin 3 (three) times daily before meals. 50U BID and 60U QHS     levothyroxine 25 MCG tablet  Commonly known as:  SYNTHROID, LEVOTHROID  Take 25 mcg by mouth daily.     metoprolol succinate 25 MG 24 hr  tablet  Commonly known as:  TOPROL-XL  Take 1 tablet (25 mg total) by mouth daily.     pantoprazole 40 MG tablet  Commonly known  as:  PROTONIX  Take 1 tablet (40 mg total) by mouth 2 (two) times daily.     silver sulfADIAZINE 1 % cream  Commonly known as:  SILVADENE  Apply 1 application topically 4 (four) times daily.     torsemide 10 MG tablet  Commonly known as:  DEMADEX  TAKE ONE (1) TABLET EACH DAY           Follow-up Information    Follow up with [facility], HEATH, MD In 2 weeks.   Specialties:  Interventional Radiology, Radiology   Why:  Our office will call patient with appointment in 2-4 weeks in [place]   Contact information:   [address] [place] [address] [phone]        Signed: Hedy Jacob 04/24/2015, 1:54 PM   I have spent Greater Than 30 Minutes discharging Norris.

## 2015-04-24 NOTE — Consult Note (Signed)
   [facility] CM Inpatient Consult   04/24/2015  [name] 1946/03/22 [id number] Notification received of the patient's admission.  Patient is currently active [up to admission]  with [facility] for chronic disease management services.  Patient has been engaged by a [employer].  Our community based plan of care has focused on disease management and community resource support.  Patient will receive a post discharge transition of care call and will be evaluated for monthly home visits for assessments and disease process education.  Made Inpatient Case Manager aware that [facility] following. Of note, [facility] Care Management services does not replace or interfere with any services that are arranged by inpatient case management or social work.  For additional questions or referrals please contact: Natividad Brood, RN BSN [facility] Liaison  [phone] business mobile phone

## 2015-04-24 NOTE — Progress Notes (Signed)
Ua output after foley removal: 211mL.

## 2015-04-24 NOTE — Discharge Instructions (Signed)

## 2015-04-24 NOTE — Progress Notes (Signed)
Inpatient Diabetes Program Recommendations  AACE/ADA: New Consensus Statement on Inpatient Glycemic Control (2013)  Target Ranges:  Prepandial:   less than 140 mg/dL      Peak postprandial:   less than 180 mg/dL (1-2 hours)      Critically ill patients:  140 - 180 mg/dL   Results for [name], [name] (MRN [id number]) as of 04/24/2015 07:43  Ref. Range 04/23/2015 21:46  Glucose-Capillary Latest Ref Range: 65-99 mg/dL 310 (H)   Diabetes history: DM2 Outpatient Diabetes medications: Lantus 70 units QAM, Lantus 60 units QPM, Humalog 50 units BID, Humalog 60 units QHS Current orders for Inpatient glycemic control: Novolog 0-15 units TID with meals  Inpatient Diabetes Program Recommendations Insulin - Basal: Patient has not received any basal insulin since being admitted to the hospital. Glucose last night was 310 mg/dl at 21:46.  If patient is not discharged this morning early, will need to order basal insulin. Patient takes Lantus 70 units QAM and Lantus 60 units QPM as an outpatient.  Diet: Please change diet from Regular to Carb Modified diet.  Thanks, Barnie Alderman, RN, MSN, CCRN, CDE Diabetes Coordinator Inpatient Diabetes Program [phone] (Team Pager from [place] to [place]) [phone] ([place] office) [phone] [facility] office) [phone] [facility] office)

## 2015-04-24 NOTE — Progress Notes (Signed)
PIV removed-pt tol well. Disch instruct and f/u appt given. Pt stated understanding.

## 2015-04-24 NOTE — Progress Notes (Signed)
Pt left in wc w tech and family.

## 2015-04-24 NOTE — Care Management Note (Signed)
Case Management Note  Patient Details  Name: OTHNIEL MARET MRN: [id number] Date of Birth: [birth year]  Subjective/Objective:                 Patient from home, self care. Denies any barriers to care. Denies any assistance from CM at this time. Expect for discharge today to home.   Action/Plan:   Expected Discharge Date:                  Expected Discharge Plan:  Home/Self Care  In-House Referral:     Discharge planning Services  CM Consult  Post Acute Care Choice:    Choice offered to:     DME Arranged:    DME Agency:     HH Arranged:    [place] Agency:     Status of Service:  Completed, signed off  Medicare Important Message Given:    Date Medicare IM Given:    Medicare IM give by:    Date Additional Medicare IM Given:    Additional Medicare Important Message give by:     If discussed at [place] of Stay Meetings, dates discussed:    Additional Comments:  Carles Collet, RN 04/24/2015, 11:57 AM

## 2015-04-25 ENCOUNTER — Other Ambulatory Visit (HOSPITAL_COMMUNITY): Payer: Self-pay | Admitting: Interventional Radiology

## 2015-04-25 DIAGNOSIS — I739 Peripheral vascular disease, unspecified: Secondary | ICD-10-CM

## 2015-04-27 ENCOUNTER — Other Ambulatory Visit: Payer: Self-pay | Admitting: *Deleted

## 2015-04-27 ENCOUNTER — Telehealth: Payer: Self-pay | Admitting: Internal Medicine

## 2015-04-27 ENCOUNTER — Other Ambulatory Visit (HOSPITAL_COMMUNITY): Payer: Self-pay | Admitting: Interventional Radiology

## 2015-04-27 ENCOUNTER — Ambulatory Visit (HOSPITAL_COMMUNITY): Payer: Medicare Other | Admitting: Physical Therapy

## 2015-04-27 DIAGNOSIS — S91109D Unspecified open wound of unspecified toe(s) without damage to nail, subsequent encounter: Secondary | ICD-10-CM

## 2015-04-27 DIAGNOSIS — R2681 Unsteadiness on feet: Secondary | ICD-10-CM

## 2015-04-27 DIAGNOSIS — I739 Peripheral vascular disease, unspecified: Secondary | ICD-10-CM

## 2015-04-27 DIAGNOSIS — R262 Difficulty in walking, not elsewhere classified: Secondary | ICD-10-CM

## 2015-04-27 NOTE — Therapy (Addendum)
[facility], [name], [address] Phone: [phone]   Fax:  [phone]  Physical Therapy Treatment  Patient Details  Name: [name] MRN: [id number] Date of Birth: 1946/01/04 Referring Provider:  Sanjuana Kava, MD  Encounter Date: 04/27/2015      PT End of Session - 04/27/15 1212    Visit Number 2   Number of Visits 24   Date for PT Re-Evaluation 05/20/15   Authorization Type [employer] Medicare    Authorization Time Period 04/22/15 to 06/21/05   Authorization - Visit Number 2   Authorization - Number of Visits 10   PT Start Time 0945   PT Stop Time 1024   PT Time Calculation (min) 39 min   Activity Tolerance Patient tolerated treatment well   Behavior During Therapy [facility] for tasks assessed/performed      Past Medical History  Diagnosis Date  . Hypertension   . Diabetes mellitus     type 2   . History of valve replacement 2010    [facility] Ease bioprosthetic 85mm  . Thyroid disease   . Stroke 2003    R-sided weakness & upper extremity swelling   . Coronary artery disease   . Dyslipidemia   . At risk for sudden cardiac death 02-Jul-2013  . PAF (paroxysmal atrial fibrillation) 05/2013    converted with amiodarone to SR  . Chronic anticoagulation 05/2013    started  . S/P cardiac catheterization, Rt & Lt heart cath 06/05/2013 2013/07/02  . S/P CABG x 3 2010  . GERD (gastroesophageal reflux disease)   . Cataract   . Chronic kidney disease     Patient reports he is seeing Kidney doctor in September    Past Surgical History  Procedure Laterality Date  . Coronary artery bypass graft  01/26/2009    LIMA to LAD, SVG to circumflex, SVG to PDA  . Aortic valve replacement  01/26/2009    [facility] Ease bioprosthetic 80mm valve  . Transthoracic echocardiogram  09/2011    grade 1 diastolic dysfunction; increasing valve gradient; calcified MV annulus   . Cardiac catheterization  06/05/2013    native  3 vessel disease; patent LIMA to LAD, SVG to OM and SVG to PDA; Elevated right and left heart filling pressures, with predominantly pulmonary venous hypertension, normal PVR  . Transthoracic echocardiogram  06/03/2013    EF 25-30%, mod conc. hypertrophy, grade 3 diastolic dysfunction; LA mod dilated; calcified MV annulus; transaortic gradients are normal for the bioprosthetic valve; inf vena cava dilated (elevated CVP) - LifeVest  . Left and right heart catheterization with coronary angiogram N/A 06/05/2013    Procedure: LEFT AND RIGHT HEART CATHETERIZATION WITH CORONARY ANGIOGRAM;  Surgeon: Leonie Man, MD;  Location[facility] CATH LAB;  Service: Cardiovascular;  Laterality: N/A;  . Graft(s) angiogram  06/05/2013    Procedure: GRAFT(S) Cyril Loosen;  Surgeon: Leonie Man, MD;  Location: [facility] CATH LAB;  Service: Cardiovascular;;    There were no vitals filed for this visit.  Visit Diagnosis:  Open toe wound, subsequent encounter  Difficulty walking  Unsteadiness           Wound Therapy - 04/27/15 1207    Subjective Patient states that he had two stents put into his leg to improve blood flow.    Patient and Family Stated Goals wound to heal    Date of Onset --  8 months ago    Prior Treatments --  has been  using soap and water, salt water    Evaluation and Treatment Procedures Explained to Patient/Family Yes   Evaluation and Treatment Procedures agreed to   Wound Properties Date First Assessed: 04/22/15 Time First Assessed: 1020 Wound Type: Other (Comment) , open wound   Location: Toe (Comment  which one) , R great toe   Location Orientation: Right Present on Admission: Yes   Dressing Type Gauze (Comment);Silver dressings  Carraklenz sprayed on wound vaseline to perimeter   Dressing Status Old drainage   Dressing Change Frequency Other (Comment)  patient has been changing it PRN    Site / Wound Assessment Granulation tissue;Yellow;Brown   % Wound base Red or Granulating 70%   %  Wound base Yellow 30%   % Wound base Black 0%   % Wound base Other (Comment) --  brown    Peri-wound Assessment Other (Comment)  callous    Closure None   Drainage Amount Minimal   Drainage Description Serous;No odor   Treatment Cleansed;Debridement (Selective)   Selective Debridement - Location R great toe    Selective Debridement - Tools Used Forceps;Scalpel   Selective Debridement - Tissue Removed slough and callous    Wound Therapy - Clinical Statement Pt had two stents placed in his LE on 04/22/2015 which should improve healing significantly. Pt has a small hole in the center of his wound with unknown depth.     Wound Therapy - Functional Problem List non-healing wound, gait difficulty    Factors Delaying/Impairing Wound Healing Diabetes Mellitus;Altered sensation;Other (comment)   Wound Therapy - Frequency 3X / week   Wound Therapy - Current Recommendations Diabetic teaching;PT   Wound Plan silver and gauze; compression contraindicated    Dressing  --   Decrease Necrotic Tissue to 20%   Decrease Necrotic Tissue - Progress Progressing toward goal   Increase Granulation Tissue to 80%   Increase Granulation Tissue - Progress Progressing toward goal   Decrease Length/Width/Depth by (cm) 1.0   Decrease Length/Width/Depth - Progress Progressing toward goal   Patient/Family will be able to  independently dress and inspect wound for signs of infection   Patient/Family Instruction Goal - Progress Progressing toward goal   Additional Wound Therapy Goal Full wound healing    Additional Wound Therapy Goal - Progress Progressing toward goal   Time For Goal Achievement --              Problem List Patient Active Problem List   Diagnosis Date Noted  . S/P angioplasty 04/23/2015  . Peripheral arterial occlusive disease 04/23/2015  . PAD (peripheral artery disease)   . Critical lower limb ischemia   . Toe ulcer, right   . Type II or unspecified type diabetes mellitus without  mention of complication, uncontrolled 11/27/2013  . Acute respiratory failure with hypoxia 11/26/2013  . CAP (community acquired pneumonia) 11/26/2013  . CHF exacerbation 11/25/2013  . Cardiomyopathy, ischemic 06/13/2013  . At risk for sudden cardiac death 06/09/2013  . S/P cardiac catheterization, Rt & Lt heart cath 06/05/2013 Jun 09, 2013  . Acute on chronic combined systolic and diastolic HF (heart failure), NYHA class 3 06/04/2013  . Atrial flutter with rapid ventricular response 06/02/2013  . Acute renal failure: Cr up to 1.8 06/02/2013  . Hyperkalemia 06/02/2013    Class: Acute  . LBBB (left bundle branch block) 06/01/2013  . Hypotension- secondary to AF and Diltiazem Rx 06/01/2013  . Obesity (BMI 30-39.9) 06/01/2013  . Acute on chronic diastolic heart failure - due to Afib AVR  06/01/2013  . Atrial fibrillation with RVR- converted with Amiodarone 05/31/2013  . Stroke- Lt brain 2003 04/08/2013  . Hemiplegia affecting right dominant side 04/08/2013  . Cataract 04/08/2013  . GERD (gastroesophageal reflux disease) 04/08/2013  . S/P CABG x 3 - 2010 04/08/2013  . S/P AVR-tissue, 2010 04/08/2013  . DM2 (diabetes mellitus, type 2) 04/08/2013  . Dyslipidemia 04/08/2013  . SUBACROMIAL BURSITIS, LEFT 11/05/2009   Rayetta Humphrey, PT CLT [phone] 04/27/2015, 12:13 PM  [facility] [address] [place], [name], [address] Phone: [phone]   Fax:  [phone]

## 2015-04-27 NOTE — Telephone Encounter (Signed)
Pam from [facility] called concerned that patient who can't read would take both the plavix ordered on 9/10 and the eliquis that has been approved  Discussed with Dr. Debara Pickett.  Eliquis started by Dr. Debara Pickett on 8/31  Plavix started on 9/10 per [place] Radiologist on 9/10  Pharmacy called.  Told Pam that he can continue with Eliquis and Plavix and remind the patient  Not to take any aspirn

## 2015-04-27 NOTE — Patient Outreach (Signed)
Call to patient for Transition of care call week #1  Patient states he is at pharmacy requests call back.  Call back to patient, no answer, left VM w/RNCM contact.  Will plan to await call back. If not call back will call again later. Royetta Crochet. Laymond Purser, RN, BSN, [place] [phone]

## 2015-04-29 ENCOUNTER — Other Ambulatory Visit: Payer: Self-pay | Admitting: *Deleted

## 2015-04-29 ENCOUNTER — Ambulatory Visit (HOSPITAL_COMMUNITY): Payer: Medicare Other

## 2015-04-29 DIAGNOSIS — S91109D Unspecified open wound of unspecified toe(s) without damage to nail, subsequent encounter: Secondary | ICD-10-CM | POA: Diagnosis not present

## 2015-04-29 DIAGNOSIS — R262 Difficulty in walking, not elsewhere classified: Secondary | ICD-10-CM

## 2015-04-29 DIAGNOSIS — R2681 Unsteadiness on feet: Secondary | ICD-10-CM

## 2015-04-29 NOTE — Therapy (Signed)
[place], [name], [address] Phone: [phone]   Fax:  [phone]  Physical Therapy Treatment  Patient Details  Name: [name] MRN: [id number] Date of Birth: 09-28-1945 Referring Provider:  Sanjuana Kava, MD  Encounter Date: 04/29/2015      PT End of Session - 04/29/15 0844    Visit Number 3   Number of Visits 24   Date for PT Re-Evaluation 05/20/15   Authorization Type UHC Medicare    Authorization Time Period 04/22/15 to 06/21/05   Authorization - Visit Number 3   Authorization - Number of Visits 10   PT Start Time 0802   PT Stop Time 0840   PT Time Calculation (min) 38 min   Activity Tolerance Patient tolerated treatment well   Behavior During Therapy [facility] for tasks assessed/performed      Past Medical History  Diagnosis Date  . Hypertension   . Diabetes mellitus     type 2   . History of valve replacement 2010    [facility] Ease bioprosthetic 30mm  . Thyroid disease   . Stroke 2003    R-sided weakness & upper extremity swelling   . Coronary artery disease   . Dyslipidemia   . At risk for sudden cardiac death 06/15/13  . PAF (paroxysmal atrial fibrillation) 05/2013    converted with amiodarone to SR  . Chronic anticoagulation 05/2013    started  . S/P cardiac catheterization, Rt & Lt heart cath 06/05/2013 15-Jun-2013  . S/P CABG x 3 2010  . GERD (gastroesophageal reflux disease)   . Cataract   . Chronic kidney disease     Patient reports he is seeing Kidney doctor in September    Past Surgical History  Procedure Laterality Date  . Coronary artery bypass graft  01/26/2009    LIMA to LAD, SVG to circumflex, SVG to PDA  . Aortic valve replacement  01/26/2009    [facility] Ease bioprosthetic 69mm valve  . Transthoracic echocardiogram  09/2011    grade 1 diastolic dysfunction; increasing valve gradient; calcified MV annulus   . Cardiac catheterization  06/05/2013    native  3 vessel disease; patent LIMA to LAD, SVG to OM and SVG to PDA; Elevated right and left heart filling pressures, with predominantly pulmonary venous hypertension, normal PVR  . Transthoracic echocardiogram  06/03/2013    EF 25-30%, mod conc. hypertrophy, grade 3 diastolic dysfunction; LA mod dilated; calcified MV annulus; transaortic gradients are normal for the bioprosthetic valve; inf vena cava dilated (elevated CVP) - LifeVest  . Left and right heart catheterization with coronary angiogram N/A 06/05/2013    Procedure: LEFT AND RIGHT HEART CATHETERIZATION WITH CORONARY ANGIOGRAM;  Surgeon: Leonie Man, MD;  Location: [facility] CATH LAB;  Service: Cardiovascular;  Laterality: N/A;  . Graft(s) angiogram  06/05/2013    Procedure: GRAFT(S) Cyril Loosen;  Surgeon: Leonie Man, MD;  Location: [facility] CATH LAB;  Service: Cardiovascular;;    There were no vitals filed for this visit.  Visit Diagnosis:  Open toe wound, subsequent encounter  Difficulty walking  Unsteadiness      Subjective Assessment - 04/29/15 0839    Subjective Pt stated LE was sore from stent, no pain with toe   Currently in Pain? No/denies                       Wound Therapy - 04/29/15 0840    Subjective Pt stated LE  was sore from stent, no pain with toe   Patient and Family Stated Goals wound to heal    Pain Assessment No/denies pain   Evaluation and Treatment Procedures Explained to Patient/Family Yes   Evaluation and Treatment Procedures agreed to   Wound Properties Date First Assessed: 04/22/15 Time First Assessed: 1020 Wound Type: Other (Comment) , open wound   Location: Toe (Comment  which one) , R great toe   Location Orientation: Right Present on Admission: Yes   Dressing Type Gauze (Comment);Silver dressings  vaseline perimeter   Dressing Status Old drainage   Dressing Change Frequency Other (Comment)  pt. changing PRN   Site / Wound Assessment Granulation tissue;Yellow;Brown   % Wound base Red or  Granulating 70%   % Wound base Yellow 30%   % Wound base Black 0%   Peri-wound Assessment Other (Comment)  callous   Closure None   Drainage Amount Minimal   Drainage Description Serous;No odor   Treatment Cleansed;Debridement (Selective)   Selective Debridement - Location R great toe    Selective Debridement - Tools Used Forceps;Scalpel   Selective Debridement - Tissue Removed slough and callous    Wound Therapy - Clinical Statement Selective debridement for removal of slough and dead skin perimeter, reduction in callous perimeter of wound noted.  Continued with silver dressings and gauze.  No reports of pain through session.     Wound Therapy - Functional Problem List non-healing wound, gait difficulty    Factors Delaying/Impairing Wound Healing Diabetes Mellitus;Altered sensation;Other (comment)   Wound Therapy - Frequency 3X / week   Wound Therapy - Current Recommendations Diabetic teaching;PT   Wound Plan silver and gauze; compression contraindicated           Problem List Patient Active Problem List   Diagnosis Date Noted  . S/P angioplasty 04/23/2015  . Peripheral arterial occlusive disease 04/23/2015  . PAD (peripheral artery disease)   . Critical lower limb ischemia   . Toe ulcer, right   . Type II or unspecified type diabetes mellitus without mention of complication, uncontrolled 11/27/2013  . Acute respiratory failure with hypoxia 11/26/2013  . CAP (community acquired pneumonia) 11/26/2013  . CHF exacerbation 11/25/2013  . Cardiomyopathy, ischemic 06/13/2013  . At risk for sudden cardiac death 06/14/2013  . S/P cardiac catheterization, Rt & Lt heart cath 06/05/2013 06/14/2013  . Acute on chronic combined systolic and diastolic HF (heart failure), NYHA class 3 06/04/2013  . Atrial flutter with rapid ventricular response 06/02/2013  . Acute renal failure: Cr up to 1.8 06/02/2013  . Hyperkalemia 06/02/2013    Class: Acute  . LBBB (left bundle branch block)  06/01/2013  . Hypotension- secondary to AF and Diltiazem Rx 06/01/2013  . Obesity (BMI 30-39.9) 06/01/2013  . Acute on chronic diastolic heart failure - due to Afib AVR 06/01/2013  . Atrial fibrillation with RVR- converted with Amiodarone 05/31/2013  . Stroke- Lt brain 2003 04/08/2013  . Hemiplegia affecting right dominant side 04/08/2013  . Cataract 04/08/2013  . GERD (gastroesophageal reflux disease) 04/08/2013  . S/P CABG x 3 - 2010 04/08/2013  . S/P AVR-tissue, 2010 04/08/2013  . DM2 (diabetes mellitus, type 2) 04/08/2013  . Dyslipidemia 04/08/2013  . SUBACROMIAL BURSITIS, LEFT 11/05/2009   Ihor Austin, LPTA; CBIS [phone]  Aldona Lento 04/29/2015, 8:45 AM  [place], [name], [address] Phone: [phone]   Fax:  [phone]

## 2015-04-29 NOTE — Patient Outreach (Signed)
[name]) Care Management   04/29/2015  [name] 1945-09-03 [id number]  [name] is an 69 y.o. male  Subjective:   Patient reporting he had his surgery last week, follows up Patient states he has good pulses in right foot now Patient states that he sees Collene Mares, PA-C 9/27 about diabetes, states sugars are up to 200-300s  Patient back on his antibiotic for a couple of weeks.  Patient reports he was prescribed another blood thinner and he is worried about taking both.  Pt reports blood sugars are back up, running in 200-300's  Patient going to outpt rehab for wound on rt foot, states it is healing good, but he is getting tired of going to appointments  Patient plans to move "back home" to low income apartment, but is not sure when he will be moving.  Patient still has support from his niece, who took him to his surgery last week.   Objective:   BP 128/60 mmHg  Pulse 65  Resp 20  Wt 250 lb (113.399 kg)  SpO2 96% Review of Systems  Constitutional: Negative.   HENT: Negative.   Eyes: Negative.   Respiratory: Negative.   Cardiovascular: Negative.   Gastrointestinal: Negative.   Genitourinary: Negative.   Musculoskeletal: Negative.   Neurological: Negative.   Endo/Heme/Allergies: Negative.   Psychiatric/Behavioral: Negative.     Physical Exam  Constitutional: He appears well-developed.  Neck: Normal range of motion.  Cardiovascular: Normal rate.   Respiratory: Effort normal and breath sounds normal.  GI: Soft. Bowel sounds are normal.  Musculoskeletal: Normal range of motion.  Skin: Skin is warm and dry.  Right foot with bandage    Current Medications:   Current Outpatient Prescriptions  Medication Sig Dispense Refill  . acetaminophen (TYLENOL) 500 MG tablet Take 1,000 mg by mouth every 6 (six) hours as needed. For pain/headaches     . ALPRAZolam (XANAX) 1 MG tablet Take 1 mg by mouth 4 (four) times daily as needed for  anxiety.     Marland Kitchen amiodarone (PACERONE) 200 MG tablet Take 1 tablet (200 mg total) by mouth daily. 90 tablet 1  . apixaban (ELIQUIS) 5 MG TABS tablet Take 1 tablet (5 mg total) by mouth 2 (two) times daily. 180 tablet 1  . atorvastatin (LIPITOR) 20 MG tablet Take 1 tablet (20 mg total) by mouth daily at 6 PM. 90 tablet 1  . bismuth subsalicylate (PEPTO BISMOL) 262 MG/15ML suspension Take 30 mLs by mouth every 6 (six) hours as needed for indigestion.    . celecoxib (CELEBREX) 200 MG capsule Take 1 capsule by mouth 2 (two) times daily.    Marland Kitchen docusate sodium (COLACE) 100 MG capsule Take 100 mg by mouth 2 (two) times daily.      Marland Kitchen doxycycline (VIBRA-TABS) 100 MG tablet Take 100 mg by mouth 2 (two) times daily.    . fenofibrate (TRICOR) 145 MG tablet Take 1 tablet (145 mg total) by mouth daily. 90 tablet 1  . ferrous sulfate 325 (65 FE) MG EC tablet Take 325 mg by mouth every morning.    . gabapentin (NEURONTIN) 300 MG capsule Take 300 mg by mouth 2 (two) times daily.     Marland Kitchen HYDROcodone-acetaminophen (NORCO) 10-325 MG per tablet Take 1 tablet by mouth as needed for severe pain.     Marland Kitchen insulin glargine (LANTUS) 100 UNIT/ML injection Inject 60-70 Units into the skin 2 (two) times daily. Takes70 units in the morning and 60 units in  the evening    . insulin lispro (HUMALOG) 100 UNIT/ML injection Inject 50-60 Units into the skin 3 (three) times daily before meals. 50U BID and 60U QHS    . levothyroxine (SYNTHROID, LEVOTHROID) 25 MCG tablet Take 25 mcg by mouth daily.      . metoprolol succinate (TOPROL-XL) 25 MG 24 hr tablet Take 1 tablet (25 mg total) by mouth daily. 90 tablet 1  . Omega-3 Fatty Acids (FISH OIL PO) Take 1 capsule by mouth 2 (two) times daily.     . pantoprazole (PROTONIX) 40 MG tablet Take 1 tablet (40 mg total) by mouth 2 (two) times daily. 180 tablet 1  . silver sulfADIAZINE (SILVADENE) 1 % cream Apply 1 application topically 4 (four) times daily.    Marland Kitchen torsemide (DEMADEX) 10 MG tablet TAKE ONE  (1) TABLET EACH DAY 30 tablet 1  . clopidogrel (PLAVIX) 75 MG tablet Take 1 tablet (75 mg total) by mouth daily. (Patient not taking: Reported on 04/29/2015) 30 tablet 3   No current facility-administered medications for this visit.    Functional Status:   In your present state of health, do you have any difficulty performing the following activities: 04/23/2015 02/18/2015  Hearing? N N  Vision? N N  Difficulty concentrating or making decisions? N Y  Walking or climbing stairs? Y N  Dressing or bathing? N N  Doing errands, shopping? - Scientist, forensic and eating ? - N  Using the Toilet? - N  In the past six months, have you accidently leaked urine? - N  Do you have problems with loss of bowel control? - N  Managing your Medications? - N  Managing your Finances? - N  Housekeeping or managing your Housekeeping? - Y    Fall/Depression Screening:    Fall Risk  04/29/2015 02/18/2015 02/11/2015  Falls in the past year? Yes Yes Yes  Number falls in past yr: 1 1 1   Injury with Fall? No No Yes  Risk for fall due to : Impaired balance/gait;History of fall(s) Impaired balance/gait Impaired balance/gait  Risk for fall due to (comments): - stroke that has affected right side -  Follow up - Falls evaluation completed Falls prevention discussed   PHQ 2/9 Scores 04/29/2015 02/18/2015 02/11/2015  PHQ - 2 Score 1 2 2   PHQ- 9 Score - 2 7    Assessment:   Need for education on Medication Elevated blood sugars Slow healing foot wound  Plan:  Educated patient on taking blood thinners, reinforced need to take both until he is seen again post procedure,  Patient verbalized he would start taking today with pm medications. Educated on diet and blood sugars Patient to increase protein and decrease fried foods to try and manage blood sugars Encouraged patient to keep appointments with rehab for wound care. Patient will keep follow up appointments  RNCM will continue Transition of Care calls   Royetta Crochet.  Laymond Purser, RN, BSN, [place] [phone]  [facility] CM Care Plan Problem Two        Most Recent Value   Care Plan Problem Two  Uncontrolled diabetes as evidenced by elevated CBGs, HGA1C and nonhealing wound.   Care Plan for Problem Two  Active   Interventions for Problem Two Long Term Goal   Using teachback method, encouraged patient to keep all appointments at wound center, instructed to increase low fat proteins in diet   THN Long Term Goal (31-90) days  Patient stated goal "I want  my foot to heal", Patient foot will show improvement to wound over next 90 days   THN Long Term Goal Start Date  02/19/15   THN CM Short Term Goal #1 (0-30 days)  Patient will take antibiotic as directed over next 14 days   THN CM Short Term Goal #1 Start Date  04/29/15 [restarted goal as taking atb again]   Interventions for Short Term Goal #2   Using teachback method reviewed correct dosage of antibiotic   THN CM Short Term Goal #3 (0-30 days)  Patient CBG's will be under better control over next 30 days   THN CM Short Term Goal #3 Start Date  04/29/15 [restart]   Interventions for Short Term Goal #3  Using teachback method, reviewed CBGs and 7, 14, 30 day, reviewed diet and instructed to increase proteins     THN CM Care Plan Problem Three        Most Recent Value   Care Plan Problem Three  Medication non adherence as evidenced by patient not taking blood thinner   Role Documenting the Problem Three  Care Management Coordinator   THN CM Short Term Goal #1 (0-30 days)  Patient will start taking blood thinner until follow up appointment in the next 30 days   THN CM Short Term Goal #1 Start Date  04/29/15   Interventions for Short Term Goal #1  Instructed patient on blood thinners, cautioned about not taking asprin with blood thinners

## 2015-05-01 ENCOUNTER — Ambulatory Visit (HOSPITAL_COMMUNITY): Payer: Medicare Other | Admitting: Physical Therapy

## 2015-05-01 DIAGNOSIS — S91109D Unspecified open wound of unspecified toe(s) without damage to nail, subsequent encounter: Secondary | ICD-10-CM | POA: Diagnosis not present

## 2015-05-01 DIAGNOSIS — R2681 Unsteadiness on feet: Secondary | ICD-10-CM | POA: Diagnosis not present

## 2015-05-01 DIAGNOSIS — R262 Difficulty in walking, not elsewhere classified: Secondary | ICD-10-CM | POA: Diagnosis not present

## 2015-05-01 NOTE — Therapy (Signed)
[place], [name], [address] Phone: [phone]   Fax:  [phone]  Wound Care Therapy  Patient Details  Name: [name] MRN: [id number] Date of Birth: March 29, 1946 Referring Shanley Furlough:  Sanjuana Kava, MD  Encounter Date: 05/01/2015      PT End of Session - 05/01/15 0932    Visit Number 4   Number of Visits 24   Date for PT Re-Evaluation 05/20/15   Authorization Type UHC Medicare    Authorization - Visit Number 4   Authorization - Number of Visits 10   PT Start Time 0845   PT Stop Time 0925   PT Time Calculation (min) 40 min   Activity Tolerance Patient tolerated treatment well      Past Medical History  Diagnosis Date  . Hypertension   . Diabetes mellitus     type 2   . History of valve replacement 2010    [facility] Ease bioprosthetic 37mm  . Thyroid disease   . Stroke 2003    R-sided weakness & upper extremity swelling   . Coronary artery disease   . Dyslipidemia   . At risk for sudden cardiac death 2013-06-21  . PAF (paroxysmal atrial fibrillation) 05/2013    converted with amiodarone to SR  . Chronic anticoagulation 05/2013    started  . S/P cardiac catheterization, Rt & Lt heart cath 06/05/2013 2013/06/21  . S/P CABG x 3 2010  . GERD (gastroesophageal reflux disease)   . Cataract   . Chronic kidney disease     Patient reports he is seeing Kidney doctor in September    Past Surgical History  Procedure Laterality Date  . Coronary artery bypass graft  01/26/2009    LIMA to LAD, SVG to circumflex, SVG to PDA  . Aortic valve replacement  01/26/2009    [facility] Ease bioprosthetic 63mm valve  . Transthoracic echocardiogram  09/2011    grade 1 diastolic dysfunction; increasing valve gradient; calcified MV annulus   . Cardiac catheterization  06/05/2013    native 3 vessel disease; patent LIMA to LAD, SVG to OM and SVG to PDA; Elevated right and left heart filling pressures, with  predominantly pulmonary venous hypertension, normal PVR  . Transthoracic echocardiogram  06/03/2013    EF 25-30%, mod conc. hypertrophy, grade 3 diastolic dysfunction; LA mod dilated; calcified MV annulus; transaortic gradients are normal for the bioprosthetic valve; inf vena cava dilated (elevated CVP) - LifeVest  . Left and right heart catheterization with coronary angiogram N/A 06/05/2013    Procedure: LEFT AND RIGHT HEART CATHETERIZATION WITH CORONARY ANGIOGRAM;  Surgeon: Leonie Man, MD;  Location: [facility] CATH LAB;  Service: Cardiovascular;  Laterality: N/A;  . Graft(s) angiogram  06/05/2013    Procedure: GRAFT(S) Cyril Loosen;  Surgeon: Leonie Man, MD;  Location: [facility] CATH LAB;  Service: Cardiovascular;;    There were no vitals filed for this visit.  Visit Diagnosis:  Open toe wound, subsequent encounter  Difficulty walking  Unsteadiness                 Wound Therapy - 05/01/15 0924    Subjective Pt states tht he is taking antibiotic still;.    Patient and Family Stated Goals wound to heal    Pain Assessment No/denies pain   Pain Score --  except with debridement then pt states that he can feel it.   Wound Properties Date First Assessed: 04/22/15 Time First Assessed: 1020 Wound Type:  Other (Comment) , open wound   Location: Toe (Comment  which one) , R great toe   Location Orientation: Right Present on Admission: Yes   Dressing Type Silver hydrofiber  moistened silverhydrofiber, 4x4,, and kling. Vaseline to toe   Dressing Status Old drainage   Dressing Change Frequency Monday, Wednesday, Friday   Site / Wound Assessment Granulation tissue;Yellow   % Wound base Red or Granulating 75%   % Wound base Yellow 25%   Peri-wound Assessment Edema;Erythema (blanchable)  great toe is red.   Closure None   Drainage Amount Minimal   Drainage Description Serous   Treatment Cleansed;Debridement (Selective)   Wound Therapy - Clinical Statement Pt toe appear to be more red than  at last treatment. Pt advised to watch forefoot and if he notices any increase redness in forefoot to contact MD>  Pt is already on antibiotics.  When therapist washed between pt toes area was black with dirt.  Therapist advised pt to take extra time to ensure whole foot is clean.    Wound Therapy - Functional Problem List non-healing wound complicated with DM and vascular compromise.    Factors Delaying/Impairing Wound Healing Altered sensation;Diabetes Mellitus;Infection - systemic/local;Multiple medical problems;Vascular compromise   Wound Therapy - Frequency 3X / week   Wound Plan continue with debridement.  If any increased redness in forefoot next treatment contact MD about possible culture.                   Problem List Patient Active Problem List   Diagnosis Date Noted  . S/P angioplasty 04/23/2015  . Peripheral arterial occlusive disease 04/23/2015  . PAD (peripheral artery disease)   . Critical lower limb ischemia   . Toe ulcer, right   . Type II or unspecified type diabetes mellitus without mention of complication, uncontrolled 11/27/2013  . Acute respiratory failure with hypoxia 11/26/2013  . CAP (community acquired pneumonia) 11/26/2013  . CHF exacerbation 11/25/2013  . Cardiomyopathy, ischemic 06/13/2013  . At risk for sudden cardiac death Jun 21, 2013  . S/P cardiac catheterization, Rt & Lt heart cath 06/05/2013 2013/06/21  . Acute on chronic combined systolic and diastolic HF (heart failure), NYHA class 3 06/04/2013  . Atrial flutter with rapid ventricular response 06/02/2013  . Acute renal failure: Cr up to 1.8 06/02/2013  . Hyperkalemia 06/02/2013    Class: Acute  . LBBB (left bundle branch block) 06/01/2013  . Hypotension- secondary to AF and Diltiazem Rx 06/01/2013  . Obesity (BMI 30-39.9) 06/01/2013  . Acute on chronic diastolic heart failure - due to Afib AVR 06/01/2013  . Atrial fibrillation with RVR- converted with Amiodarone 05/31/2013  . Stroke-  Lt brain 2003 04/08/2013  . Hemiplegia affecting right dominant side 04/08/2013  . Cataract 04/08/2013  . GERD (gastroesophageal reflux disease) 04/08/2013  . S/P CABG x 3 - 2010 04/08/2013  . S/P AVR-tissue, 2010 04/08/2013  . DM2 (diabetes mellitus, type 2) 04/08/2013  . Dyslipidemia 04/08/2013  . SUBACROMIAL BURSITIS, LEFT 11/05/2009  Rayetta Humphrey, PT CLT [phone] 05/01/2015, 9:33 AM  [place] [address] [place], [name], [address] Phone: [phone]   Fax:  [phone]

## 2015-05-04 ENCOUNTER — Ambulatory Visit (HOSPITAL_COMMUNITY): Payer: Medicare Other | Admitting: Physical Therapy

## 2015-05-04 DIAGNOSIS — S91109D Unspecified open wound of unspecified toe(s) without damage to nail, subsequent encounter: Secondary | ICD-10-CM

## 2015-05-04 DIAGNOSIS — R262 Difficulty in walking, not elsewhere classified: Secondary | ICD-10-CM

## 2015-05-04 DIAGNOSIS — R2681 Unsteadiness on feet: Secondary | ICD-10-CM

## 2015-05-04 NOTE — Therapy (Signed)
[place], [name], [address] Phone: [phone]   Fax:  [phone]  Wound Care Therapy  Patient Details  Name: [name] MRN: [id number] Date of Birth: August 15, 1946 Referring Provider:  Sanjuana Kava, MD  Encounter Date: 05/04/2015      PT End of Session - 05/04/15 0914    Visit Number 5   Number of Visits 24   Date for PT Re-Evaluation 05/20/15   Authorization Type UHC Medicare    Authorization - Visit Number 5   Authorization - Number of Visits 10   PT Start Time 0845   PT Stop Time 0910   PT Time Calculation (min) 25 min   Activity Tolerance Patient tolerated treatment well   Behavior During Therapy [facility] for tasks assessed/performed      Past Medical History  Diagnosis Date  . Hypertension   . Diabetes mellitus     type 2   . History of valve replacement 2010    [facility] Ease bioprosthetic 37m  . Thyroid disease   . Stroke 2003    R-sided weakness & upper extremity swelling   . Coronary artery disease   . Dyslipidemia   . At risk for sudden cardiac death 12014-11-14 . PAF (paroxysmal atrial fibrillation) 05/2013    converted with amiodarone to SR  . Chronic anticoagulation 05/2013    started  . S/P cardiac catheterization, Rt & Lt heart cath 06/05/2013 111/14/2014 . S/P CABG x 3 2010  . GERD (gastroesophageal reflux disease)   . Cataract   . Chronic kidney disease     Patient reports he is seeing Kidney doctor in September    Past Surgical History  Procedure Laterality Date  . Coronary artery bypass graft  01/26/2009    LIMA to LAD, SVG to circumflex, SVG to PDA  . [facility]ortic [facility]  [facility]  . Transthoracic echocardiogram  09/2011    grade 1 diastolic dysfunction; increasing valve gradient; calcified MV annulus   . Cardiac catheterization  06/05/2013    native 3 vessel disease; patent LIMA to LAD, SVG to OM and SVG to  PDA; Elevated right and left heart filling pressures, with predominantly pulmonary venous hypertension, normal PVR  . Transthoracic echocardiogram  06/03/2013    EF 25-30%, mod conc. hypertrophy, grade 3 diastolic dysfunction; LA mod dilated; calcified MV annulus; transaortic gradients are normal for the bioprosthetic valve; inf vena cava dilated (elevated CVP) - LifeVest  . Left and right heart catheterization with coronary angiogram N/A 06/05/2013    Procedure: LEFT AND RIGHT HEART CATHETERIZATION WITH CORONARY ANGIOGRAM;  Surgeon: DaLeonie ManMD;  [facility];  Service: Cardiovascular;  Laterality: N/A;  . Graft(s) angiogram  06/05/2013    Procedure: GRAFT(S) ANCyril Loosen Surgeon: DaLeonie ManMD;  [facility];  Service: Cardiovascular;;    There were no vitals filed for this visit.  Visit Diagnosis:  Open toe wound, subsequent encounter  Difficulty walking  Unsteadiness                 Wound Therapy - 05/04/15 0907    Subjective Pt states he's suppose to go back to Dr. McCaprice Beaveroday.   Patient and Family Stated Goals wound to heal    Pain Assessment No/denies pain   Wound Properties Date First Assessed: 04/22/15 Time First Assessed: 1020 Wound Type: Other (Comment) , open wound  Location: Toe (Comment  which one) , R great toe   Location Orientation: Right Present on Admission: Yes   Dressing Type Silver hydrofiber  silver hydrofiber,2x2,, and 2" conform.   Dressing Status Old drainage   Dressing Change Frequency Monday, Wednesday, Friday   Site / Wound Assessment Granulation tissue;Yellow   % Wound base Red or Granulating 80%   % Wound base Yellow 20%   Peri-wound Assessment Edema;Erythema (blanchable);Maceration   Closure None   Drainage Amount Minimal   Drainage Description Serous   Treatment Cleansed;Debridement (Selective)   Wound Therapy - Clinical Statement No redness noted today perimeter of wound.  Pt reports itching and  intermittent pain into foot.  Maceration noted medial aspect of Rt great toe. Able to debride more slough from plantar side of great toe.  Contiued with silver hydrofiber to absorb moisture.    Wound Therapy - Functional Problem List non-healing wound complicated with DM and vascular compromise.    Factors Delaying/Impairing Wound Healing Altered sensation;Diabetes Mellitus;Infection - systemic/local;Multiple medical problems;Vascular compromise   Wound Therapy - Frequency 3X / week   Wound Plan continue with debridement.  If any increased redness in forefoot next treatment contact MD about possible culture.    Decrease Necrotic Tissue to 20%   Decrease Necrotic Tissue - Progress Met   Increase Granulation Tissue to 80%   Increase Granulation Tissue - Progress Met   Decrease Length/Width/Depth by (cm) 1.0   Decrease Length/Width/Depth - Progress Progressing toward goal   Patient/Family will be able to  independently dress and inspect wound for signs of infection   Patient/Family Instruction Goal - Progress Progressing toward goal   Additional Wound Therapy Goal Full wound healing    Additional Wound Therapy Goal - Progress Progressing toward goal                              Problem List Patient Active Problem List   Diagnosis Date Noted  . S/P angioplasty 04/23/2015  . Peripheral arterial occlusive disease 04/23/2015  . PAD (peripheral artery disease)   . Critical lower limb ischemia   . Toe ulcer, right   . Type II or unspecified type diabetes mellitus without mention of complication, uncontrolled 11/27/2013  . Acute respiratory failure with hypoxia 11/26/2013  . CAP (community acquired pneumonia) 11/26/2013  . CHF exacerbation 11/25/2013  . Cardiomyopathy, ischemic 06/13/2013  . At risk for sudden cardiac death 2013-06-19  . S/P cardiac catheterization, Rt & Lt heart cath 06/05/2013 June 19, 2013  . Acute on chronic combined systolic and diastolic HF (heart  failure), NYHA class 3 06/04/2013  . Atrial flutter with rapid ventricular response 06/02/2013  . Acute renal failure: Cr up to 1.8 06/02/2013  . Hyperkalemia 06/02/2013    Class: Acute  . LBBB (left bundle branch block) 06/01/2013  . Hypotension- secondary to AF and Diltiazem Rx 06/01/2013  . Obesity (BMI 30-39.9) 06/01/2013  . Acute on chronic diastolic heart failure - due to Afib AVR 06/01/2013  . Atrial fibrillation with RVR- converted with Amiodarone 05/31/2013  . Stroke- Lt brain 2003 04/08/2013  . Hemiplegia affecting right dominant side 04/08/2013  . Cataract 04/08/2013  . GERD (gastroesophageal reflux disease) 04/08/2013  . S/P CABG x 3 - 2010 04/08/2013  . S/P AVR-tissue, 2010 04/08/2013  . DM2 (diabetes mellitus, type 2) 04/08/2013  . Dyslipidemia 04/08/2013  . SUBACROMIAL BURSITIS, LEFT 11/05/2009    Teena Irani, PTA/CLT [phone]  05/04/2015, 9:17 AM  [place], [name], [address] Phone: [phone]   Fax:  [phone]

## 2015-05-06 ENCOUNTER — Ambulatory Visit (HOSPITAL_COMMUNITY): Payer: Medicare Other | Admitting: Physical Therapy

## 2015-05-06 DIAGNOSIS — R2681 Unsteadiness on feet: Secondary | ICD-10-CM | POA: Diagnosis not present

## 2015-05-06 DIAGNOSIS — S91109D Unspecified open wound of unspecified toe(s) without damage to nail, subsequent encounter: Secondary | ICD-10-CM | POA: Diagnosis not present

## 2015-05-06 DIAGNOSIS — R262 Difficulty in walking, not elsewhere classified: Secondary | ICD-10-CM

## 2015-05-06 NOTE — Therapy (Signed)
[place], [name], [address] Phone: [phone]   Fax:  [phone]  Wound Care Therapy  Patient Details  Name: [name] MRN: [id number] Date of Birth: [birth year] Referring Provider:  Redmond School, MD  Encounter Date: 05/06/2015      PT End of Session - 05/06/15 1217    Visit Number 6   Number of Visits 24   Date for PT Re-Evaluation 05/20/15   Authorization Type UHC Medicare    Authorization - Visit Number 6   Authorization - Number of Visits 10   PT Start Time [phone]   PT Stop Time 0920   PT Time Calculation (min) 30 min   Activity Tolerance Patient tolerated treatment well   Behavior During Therapy [facility] for tasks assessed/performed      Past Medical History  Diagnosis Date  . Hypertension   . Diabetes mellitus     type 2   . History of valve replacement 2010    [facility] Ease bioprosthetic 57m  . Thyroid disease   . Stroke 2003    R-sided weakness & upper extremity swelling   . Coronary artery disease   . Dyslipidemia   . At risk for sudden cardiac death 106-Nov-2014 . PAF (paroxysmal atrial fibrillation) 05/2013    converted with amiodarone to SR  . Chronic anticoagulation 05/2013    started  . S/P cardiac catheterization, Rt & Lt heart cath 06/05/2013 12014-11-06 . S/P CABG x 3 2010  . GERD (gastroesophageal reflux disease)   . Cataract   . Chronic kidney disease     Patient reports he is seeing Kidney doctor in September    Past Surgical History  Procedure Laterality Date  . Coronary artery bypass graft  01/26/2009    LIMA to LAD, SVG to circumflex, SVG to PDA  . [facility]ortic valve [facility]  01/26/2009    [facility]  . Transthoracic echocardiogram  09/2011    grade 1 diastolic dysfunction; increasing valve gradient; calcified MV annulus   . Cardiac catheterization  06/05/2013    native 3 vessel disease; patent LIMA to LAD, SVG to OM and SVG  to PDA; Elevated right and left heart filling pressures, with predominantly pulmonary venous hypertension, normal PVR  . Transthoracic echocardiogram  06/03/2013    EF 25-30%, mod conc. hypertrophy, grade 3 diastolic dysfunction; LA mod dilated; calcified MV annulus; transaortic gradients are normal for the bioprosthetic valve; inf vena cava dilated (elevated CVP) - LifeVest  . Left and right heart catheterization with coronary angiogram N/A 06/05/2013    Procedure: LEFT AND RIGHT HEART CATHETERIZATION WITH CORONARY ANGIOGRAM;  Surgeon: DaLeonie ManMD;  [facility];  Service: Cardiovascular;  Laterality: N/A;  . Graft(s) angiogram  06/05/2013    Procedure: GRAFT(S) ANCyril Loosen Surgeon: DaLeonie ManMD;  [facility];  Service: Cardiovascular;;    There were no vitals filed for this visit.  Visit Diagnosis:  Open toe wound, subsequent encounter  Unsteadiness  Difficulty walking                 Wound Therapy - 05/06/15 1213    Subjective Pt states he did not go to his MD appointment.  States it is a "wTheatre managerf time".    Patient and Family Stated Goals wound to heal    Pain Assessment No/denies pain   Wound Properties Date First Assessed: 04/22/15 Time First Assessed: 101448  Wound Type: Other (Comment) , open wound   Location: Toe (Comment  which one) , R great toe   Location Orientation: Right Present on Admission: Yes   Dressing Type Silver hydrofiber  silver hydrofiber,2x2,, and 2" conform.   Dressing Status Old drainage   Dressing Change Frequency Monday, Wednesday, Friday   Site / Wound Assessment Granulation tissue;Yellow   % Wound base Red or Granulating 85%   % Wound base Yellow 15%   Peri-wound Assessment Edema;Erythema (blanchable)   Closure None   Drainage Amount Minimal   Drainage Description Serous   Treatment Cleansed;Debridement (Selective)   Selective Debridement - Location R great toe    Selective Debridement - Tools Used  Forceps;Scalpel   Selective Debridement - Tissue Removed slough and callous    Wound Therapy - Clinical Statement No maceration present today wtih overall reduction of slough upon dressing removal.  Wound with increased approximation noted around edges also.  No signs/symptoms of infection.  Spoke to patient about the importance of keeping all medical appoitnments.  States he is tired of going to foot doctor for nothing to be done.  Encouraged to call and reschedule appointment.    Wound Therapy - Functional Problem List non-healing wound complicated with DM and vascular compromise.    Factors Delaying/Impairing Wound Healing Altered sensation;Diabetes Mellitus;Infection - systemic/local;Multiple medical problems;Vascular compromise   Wound Therapy - Frequency 3X / week   Wound Plan continue with debridement.  If any increased redness in forefoot appears, contact MD about possible culture.    Decrease Necrotic Tissue to 20%   Decrease Necrotic Tissue - Progress Met   Increase Granulation Tissue to 80%   Increase Granulation Tissue - Progress Met   Decrease Length/Width/Depth by (cm) 1.0   Decrease Length/Width/Depth - Progress Progressing toward goal   Patient/Family will be able to  independently dress and inspect wound for signs of infection   Patient/Family Instruction Goal - Progress Progressing toward goal   Additional Wound Therapy Goal Full wound healing    Additional Wound Therapy Goal - Progress Progressing toward goal                              Problem List Patient Active Problem List   Diagnosis Date Noted  . S/P angioplasty 04/23/2015  . Peripheral arterial occlusive disease 04/23/2015  . PAD (peripheral artery disease)   . Critical lower limb ischemia   . Toe ulcer, right   . Type II or unspecified type diabetes mellitus without mention of complication, uncontrolled 11/27/2013  . Acute respiratory failure with hypoxia 11/26/2013  . CAP (community  acquired pneumonia) 11/26/2013  . CHF exacerbation 11/25/2013  . Cardiomyopathy, ischemic 06/13/2013  . At risk for sudden cardiac death 10-Jun-2013  . S/P cardiac catheterization, Rt & Lt heart cath 06/05/2013 06-10-13  . Acute on chronic combined systolic and diastolic HF (heart failure), NYHA class 3 06/04/2013  . Atrial flutter with rapid ventricular response 06/02/2013  . Acute renal failure: Cr up to 1.8 06/02/2013  . Hyperkalemia 06/02/2013    Class: Acute  . LBBB (left bundle branch block) 06/01/2013  . Hypotension- secondary to AF and Diltiazem Rx 06/01/2013  . Obesity (BMI 30-39.9) 06/01/2013  . Acute on chronic diastolic heart failure - due to Afib AVR 06/01/2013  . Atrial fibrillation with RVR- converted with Amiodarone 05/31/2013  . Stroke- Lt brain 2003 04/08/2013  . Hemiplegia affecting right dominant side 04/08/2013  . Cataract  04/08/2013  . GERD (gastroesophageal reflux disease) 04/08/2013  . S/P CABG x 3 - 2010 04/08/2013  . S/P AVR-tissue, 2010 04/08/2013  . DM2 (diabetes mellitus, type 2) 04/08/2013  . Dyslipidemia 04/08/2013  . SUBACROMIAL BURSITIS, LEFT 11/05/2009    Teena Irani, PTA/CLT [phone]  05/06/2015, 12:18 PM  [place] [address] [place], [name], [address] Phone: [phone]   Fax:  [phone]

## 2015-05-08 ENCOUNTER — Ambulatory Visit (HOSPITAL_COMMUNITY): Payer: Medicare Other | Admitting: Physical Therapy

## 2015-05-08 ENCOUNTER — Telehealth (HOSPITAL_COMMUNITY): Payer: Self-pay | Admitting: Physical Therapy

## 2015-05-08 DIAGNOSIS — S91109D Unspecified open wound of unspecified toe(s) without damage to nail, subsequent encounter: Secondary | ICD-10-CM

## 2015-05-08 DIAGNOSIS — R2681 Unsteadiness on feet: Secondary | ICD-10-CM | POA: Diagnosis not present

## 2015-05-08 DIAGNOSIS — R262 Difficulty in walking, not elsewhere classified: Secondary | ICD-10-CM

## 2015-05-08 NOTE — Therapy (Signed)
[place], [name], [address] Phone: [phone]   Fax:  [phone]  Wound Care Therapy  Patient Details  Name: [name] MRN: [id number] Date of Birth: 02/08/1946 Referring Provider:  Sanjuana Kava, MD  Encounter Date: 05/08/2015      PT End of Session - 05/08/15 1104    Visit Number 7   Number of Visits 24   Date for PT Re-Evaluation 05/20/15   Authorization Type UHC Medicare    Authorization Time Period 04/22/15 to 06/21/05   Authorization - Visit Number 7   Authorization - Number of Visits 10   PT Start Time 0930   PT Stop Time 1009   PT Time Calculation (min) 39 min   Activity Tolerance Patient tolerated treatment well      Past Medical History  Diagnosis Date  . Hypertension   . Diabetes mellitus     type 2   . History of valve replacement 2010    [facility] Ease bioprosthetic 39m  . Thyroid disease   . Stroke 2003    R-sided weakness & upper extremity swelling   . Coronary artery disease   . Dyslipidemia   . At risk for sudden cardiac death 1October 31, 2014 . PAF (paroxysmal atrial fibrillation) 05/2013    converted with amiodarone to SR  . Chronic anticoagulation 05/2013    started  . S/P cardiac catheterization, Rt & Lt heart cath 06/05/2013 12014/10/31 . S/P CABG x 3 2010  . GERD (gastroesophageal reflux disease)   . Cataract   . Chronic kidney disease     Patient reports he is seeing Kidney doctor in September    Past Surgical History  Procedure Laterality Date  . Coronary artery bypass graft  01/26/2009    LIMA to LAD, SVG to circumflex, SVG to PDA  . Aortic valve replacement  01/26/2009    [facility]  . Transthoracic echocardiogram  09/2011    grade 1 diastolic dysfunction; increasing valve gradient; calcified MV annulus   . Cardiac catheterization  06/05/2013    native 3 vessel disease; patent LIMA to LAD, SVG to OM and SVG to PDA;  Elevated right and left heart filling pressures, with predominantly pulmonary venous hypertension, normal PVR  . Transthoracic echocardiogram  06/03/2013    EF 25-30%, mod conc. hypertrophy, grade 3 diastolic dysfunction; LA mod dilated; calcified MV annulus; transaortic gradients are normal for the bioprosthetic valve; inf vena cava dilated (elevated CVP) - LifeVest  . Left and right heart catheterization with coronary angiogram N/A 06/05/2013    Procedure: LEFT AND RIGHT HEART CATHETERIZATION WITH CORONARY ANGIOGRAM;  Surgeon: DaLeonie ManMD;  [facility];  Service: Cardiovascular;  Laterality: N/A;  . Graft(s) angiogram  06/05/2013    Procedure: GRAFT(S) ANCyril Loosen Surgeon: DaLeonie ManMD;  [facility];  Service: Cardiovascular;;    There were no vitals filed for this visit.  Visit Diagnosis:  Open toe wound, subsequent encounter  Unsteadiness  Difficulty walking          Wound Therapy - 05/08/15 1057    Subjective Pt has no complaints; toe is beginning to itch but no pain.    Patient and Family Stated Goals wound to heal    Wound Properties Date First Assessed: 04/22/15 Time First Assessed: 1020 Wound Type: Other (Comment) , open wound   Location: Toe (Comment  which one) , R great toe  Location Orientation: Right Present on Admission: Yes   Dressing Type Silver hydrofiber  silver hydrofiber,2x2,, and 2" conform.   Dressing Status Old drainage   Dressing Change Frequency Monday, Wednesday, Friday   Site / Wound Assessment Granulation tissue;Yellow   % Wound base Red or Granulating 85%   % Wound base Yellow 15%   Peri-wound Assessment Edema;Erythema (blanchable)  no longer has redness of toe and forefoot.   Wound Width (cm) 2.7 cm   Wound Depth (cm) 1.5 cm   Tunneling (cm) area in center of wound tunnels inferiorly at least .4 cm    Undermining (cm) from medial to postreiror aspect at least .5 cm    Margins Epibole (rolled edges)   Closure  None   Drainage Amount Minimal   Drainage Description Serous   Treatment Cleansed;Debridement (Selective)   Incision Properties Date First Assessed: 04/23/15 Time First Assessed: 1738 Location: Groin Location Orientation: Left Present on Admission: No   Dressing Type Gauze (Comment);Silver hydrofiber   Dressing Changed   Dressing Change Frequency Monday, Wednesday, Friday   Site / Wound Assessment Bleeding;Granulation tissue;Yellow   Selective Debridement - Location R great toe    Selective Debridement - Tools Used Forceps;Scalpel   Selective Debridement - Tissue Removed slough, callous    Wound Therapy - Clinical Statement Pt toe and forefoot are no longer red.  Pt has improved granulation especially along superior border of wound.  PT wound has a hole in the center that tunnels inferiorly; noted undermining along wound border.  Debrided edges of wound to decrease epibole edges    Wound Therapy - Functional Problem List non-healing wound complicated with DM and vascular compromise.    Factors Delaying/Impairing Wound Healing Altered sensation;Diabetes Mellitus;Infection - systemic/local;Multiple medical problems;Vascular compromise   Wound Therapy - Frequency 3X / week   Wound Plan continue with debridement.  If any increased redness in forefoot appears, contact MD about possible culture.    Decrease Necrotic Tissue to 20%   Decrease Necrotic Tissue - Progress Met   Increase Granulation Tissue to 80%   Increase Granulation Tissue - Progress Met   Decrease Length/Width/Depth by (cm) 1.0   Decrease Length/Width/Depth - Progress Progressing toward goal   Patient/Family will be able to  independently dress and inspect wound for signs of infection   Patient/Family Instruction Goal - Progress Progressing toward goal   Additional Wound Therapy Goal Full wound healing    Additional Wound Therapy Goal - Progress Progressing toward goal                Problem List Patient Active  Problem List   Diagnosis Date Noted  . S/P angioplasty 04/23/2015  . Peripheral arterial occlusive disease 04/23/2015  . PAD (peripheral artery disease)   . Critical lower limb ischemia   . Toe ulcer, right   . Type II or unspecified type diabetes mellitus without mention of complication, uncontrolled 11/27/2013  . Acute respiratory failure with hypoxia 11/26/2013  . CAP (community acquired pneumonia) 11/26/2013  . CHF exacerbation 11/25/2013  . Cardiomyopathy, ischemic 06/13/2013  . At risk for sudden cardiac death 2013-06-18  . S/P cardiac catheterization, Rt & Lt heart cath 06/05/2013 06-18-13  . Acute on chronic combined systolic and diastolic HF (heart failure), NYHA class 3 06/04/2013  . Atrial flutter with rapid ventricular response 06/02/2013  . Acute renal failure: Cr up to 1.8 06/02/2013  . Hyperkalemia 06/02/2013    Class: Acute  . LBBB (left bundle branch block) 06/01/2013  .  Hypotension- secondary to AF and Diltiazem Rx 06/01/2013  . Obesity (BMI 30-39.9) 06/01/2013  . Acute on chronic diastolic heart failure - due to Afib AVR 06/01/2013  . Atrial fibrillation with RVR- converted with Amiodarone 05/31/2013  . Stroke- Lt brain 2003 04/08/2013  . Hemiplegia affecting right dominant side 04/08/2013  . Cataract 04/08/2013  . GERD (gastroesophageal reflux disease) 04/08/2013  . S/P CABG x 3 - 2010 04/08/2013  . S/P AVR-tissue, 2010 04/08/2013  . DM2 (diabetes mellitus, type 2) 04/08/2013  . Dyslipidemia 04/08/2013  . SUBACROMIAL BURSITIS, LEFT 11/05/2009   Rayetta Humphrey, PT CLT [phone] 05/08/2015, 11:05 AM  [place] [address] [place], [name], [address] Phone: [phone]   Fax:  [phone]

## 2015-05-11 ENCOUNTER — Ambulatory Visit (HOSPITAL_COMMUNITY): Payer: Medicare Other | Admitting: Physical Therapy

## 2015-05-11 DIAGNOSIS — S91109D Unspecified open wound of unspecified toe(s) without damage to nail, subsequent encounter: Secondary | ICD-10-CM

## 2015-05-11 DIAGNOSIS — R2681 Unsteadiness on feet: Secondary | ICD-10-CM

## 2015-05-11 DIAGNOSIS — R262 Difficulty in walking, not elsewhere classified: Secondary | ICD-10-CM | POA: Diagnosis not present

## 2015-05-11 NOTE — Telephone Encounter (Signed)
Contacted patient asking if pt could come to treatment at 9:30 instead of 11:45.  Pt agreeable to time change  Rayetta Humphrey, PT CLT [phone]

## 2015-05-11 NOTE — Therapy (Signed)
[place], [name], [address] Phone: [phone]   Fax:  [phone]  Wound Care Therapy  Patient Details  Name: [name] MRN: [id number] Date of Birth: 15-Dec-1945 Referring Provider:  Sanjuana Kava, MD  Encounter Date: 05/11/2015      PT End of Session - 05/11/15 1010    Visit Number 8   Number of Visits 24   Date for PT Re-Evaluation 05/20/15   Authorization Type UHC Medicare    Authorization Time Period 04/22/15 to 06/21/05   Authorization - Visit Number 8   Authorization - Number of Visits 10   PT Start Time 0935   PT Stop Time 1005   PT Time Calculation (min) 30 min   Activity Tolerance Patient tolerated treatment well   Behavior During Therapy [facility] for tasks assessed/performed      Past Medical History  Diagnosis Date  . Hypertension   . Diabetes mellitus     type 2   . History of valve replacement 2010    [facility] Ease bioprosthetic 39m  . Thyroid disease   . Stroke 2003    R-sided weakness & upper extremity swelling   . Coronary artery disease   . Dyslipidemia   . At risk for sudden cardiac death 1October 31, 2014 . PAF (paroxysmal atrial fibrillation) 05/2013    converted with amiodarone to SR  . Chronic anticoagulation 05/2013    started  . S/P cardiac catheterization, Rt & Lt heart cath 06/05/2013 110-31-14 . S/P CABG x 3 2010  . GERD (gastroesophageal reflux disease)   . Cataract   . Chronic kidney disease     Patient reports he is seeing Kidney doctor in September    Past Surgical History  Procedure Laterality Date  . Coronary artery bypass graft  01/26/2009    LIMA to LAD, SVG to circumflex, SVG to PDA  . [facility]ortic [facility]  [facility]  . Transthoracic echocardiogram  09/2011    grade 1 diastolic dysfunction; increasing valve gradient; calcified MV annulus   . Cardiac catheterization  06/05/2013    native 3 vessel  disease; patent LIMA to LAD, SVG to OM and SVG to PDA; Elevated right and left heart filling pressures, with predominantly pulmonary venous hypertension, normal PVR  . Transthoracic echocardiogram  06/03/2013    EF 25-30%, mod conc. hypertrophy, grade 3 diastolic dysfunction; LA mod dilated; calcified MV annulus; transaortic gradients are normal for the bioprosthetic valve; inf vena cava dilated (elevated CVP) - LifeVest  . Left and right heart catheterization with coronary angiogram N/A 06/05/2013    Procedure: LEFT AND RIGHT HEART CATHETERIZATION WITH CORONARY ANGIOGRAM;  Surgeon: DaLeonie ManMD;  [facility];  Service: Cardiovascular;  Laterality: N/A;  . Graft(s) angiogram  06/05/2013    Procedure: GRAFT(S) ANCyril Loosen Surgeon: DaLeonie ManMD;  [facility];  Service: Cardiovascular;;    There were no vitals filed for this visit.  Visit Diagnosis:  Open toe wound, subsequent encounter  Unsteadiness  Difficulty walking                 Wound Therapy - 05/11/15 1004    Subjective Pt states he can really feel his toe now.  Reports more itching and tingling.    Patient and Family Stated Goals wound to heal    Pain Assessment No/denies pain   Wound Properties Date First Assessed:  04/22/15 Time First Assessed: 1020 Wound Type: Other (Comment) , open wound   Location: Toe (Comment  which one) , R great toe   Location Orientation: Right Present on Admission: Yes   Dressing Type Silver dressings  acticoat silver,2x2,, and 2" conform.   Dressing Status Old drainage   Dressing Change Frequency Monday, Wednesday, Friday   Site / Wound Assessment Granulation tissue;Yellow   % Wound base Red or Granulating 85%   % Wound base Yellow 15%   Peri-wound Assessment Edema;Erythema (blanchable)  no longer has redness of toe and forefoot.   Margins Epibole (rolled edges)   Closure None   Drainage Amount Minimal   Drainage Description Serous   Treatment  Cleansed;Debridement (Selective)   Incision Properties Date First Assessed: 04/23/15 Time First Assessed: 1738 Location: Groin Location Orientation: Left Present on Admission: No   Dressing Type Gauze (Comment);Silver dressings   Dressing Changed   Dressing Change Frequency Monday, Wednesday, Friday   Site / Wound Assessment Bleeding;Granulation tissue;Yellow   Selective Debridement - Location R great toe    Selective Debridement - Tools Used Forceps;Scalpel   Selective Debridement - Tissue Removed slough, callous    Wound Therapy - Clinical Statement Granulation continues to improve with good coloration perimeter of wound and overall integrity.Changed dressing to acticoat silver dressing to balance current moisture levels.   Able to debride large amount of dead skin from medial border today to promote approximation.    Wound Therapy - Functional Problem List non-healing wound complicated with DM and vascular compromise.    Factors Delaying/Impairing Wound Healing Altered sensation;Diabetes Mellitus;Infection - systemic/local;Multiple medical problems;Vascular compromise   Wound Therapy - Frequency 3X / week   Wound Plan continue with debridement.  If any increased redness in forefoot appears, contact MD about possible culture.    Decrease Necrotic Tissue to 20%   Decrease Necrotic Tissue - Progress Met   Increase Granulation Tissue to 80%   Increase Granulation Tissue - Progress Partly met   Decrease Length/Width/Depth by (cm) 1.0   Decrease Length/Width/Depth - Progress Progressing toward goal   Patient/Family will be able to  independently dress and inspect wound for signs of infection   Patient/Family Instruction Goal - Progress Progressing toward goal   Additional Wound Therapy Goal Full wound healing    Additional Wound Therapy Goal - Progress Progressing toward goal                              Problem List Patient Active Problem List   Diagnosis Date Noted   . S/P angioplasty 04/23/2015  . Peripheral arterial occlusive disease 04/23/2015  . PAD (peripheral artery disease)   . Critical lower limb ischemia   . Toe ulcer, right   . Type II or unspecified type diabetes mellitus without mention of complication, uncontrolled 11/27/2013  . Acute respiratory failure with hypoxia 11/26/2013  . CAP (community acquired pneumonia) 11/26/2013  . CHF exacerbation 11/25/2013  . Cardiomyopathy, ischemic 06/13/2013  . At risk for sudden cardiac death 07-07-13  . S/P cardiac catheterization, Rt & Lt heart cath 06/05/2013 July 07, 2013  . Acute on chronic combined systolic and diastolic HF (heart failure), NYHA class 3 06/04/2013  . Atrial flutter with rapid ventricular response 06/02/2013  . Acute renal failure: Cr up to 1.8 06/02/2013  . Hyperkalemia 06/02/2013    Class: Acute  . LBBB (left bundle branch block) 06/01/2013  . Hypotension- secondary to AF and Diltiazem Rx 06/01/2013  .  Obesity (BMI 30-39.9) 06/01/2013  . Acute on chronic diastolic heart failure - due to Afib AVR 06/01/2013  . Atrial fibrillation with RVR- converted with Amiodarone 05/31/2013  . Stroke- Lt brain 2003 04/08/2013  . Hemiplegia affecting right dominant side 04/08/2013  . Cataract 04/08/2013  . GERD (gastroesophageal reflux disease) 04/08/2013  . S/P CABG x 3 - 2010 04/08/2013  . S/P AVR-tissue, 2010 04/08/2013  . DM2 (diabetes mellitus, type 2) 04/08/2013  . Dyslipidemia 04/08/2013  . SUBACROMIAL BURSITIS, LEFT 11/05/2009    Teena Irani, PTA/CLT [phone]  05/11/2015, 10:16 AM  [place], [name], [address] Phone: [phone]   Fax:  [phone]

## 2015-05-13 ENCOUNTER — Ambulatory Visit (HOSPITAL_COMMUNITY): Payer: Medicare Other

## 2015-05-13 DIAGNOSIS — R262 Difficulty in walking, not elsewhere classified: Secondary | ICD-10-CM

## 2015-05-13 DIAGNOSIS — S91109D Unspecified open wound of unspecified toe(s) without damage to nail, subsequent encounter: Secondary | ICD-10-CM | POA: Diagnosis not present

## 2015-05-13 DIAGNOSIS — R2681 Unsteadiness on feet: Secondary | ICD-10-CM | POA: Diagnosis not present

## 2015-05-13 NOTE — Therapy (Signed)
[place], [name], [address] Phone: [phone]   Fax:  [phone]  Wound Care Therapy  Patient Details  Name: [name] MRN: [id number] Date of Birth: 02-06-1946 Referring Provider:  Sanjuana Kava, MD  Encounter Date: 05/13/2015      PT End of Session - 05/13/15 1033    Visit Number 9   Number of Visits 24   Date for PT Re-Evaluation 05/20/15   Authorization Type UHC Medicare    Authorization Time Period 04/22/15 to 06/21/05   Authorization - Visit Number 9   Authorization - Number of Visits 10   PT Start Time 0940   PT Stop Time 1012   PT Time Calculation (min) 32 min   Activity Tolerance Patient tolerated treatment well   Behavior During Therapy [facility] for tasks assessed/performed      Past Medical History  Diagnosis Date  . Hypertension   . Diabetes mellitus     type 2   . History of valve replacement 2010    [facility] Ease bioprosthetic 57mm  . Thyroid disease   . Stroke 2003    R-sided weakness & upper extremity swelling   . Coronary artery disease   . Dyslipidemia   . At risk for sudden cardiac death 22-Jun-2013  . PAF (paroxysmal atrial fibrillation) 05/2013    converted with amiodarone to SR  . Chronic anticoagulation 05/2013    started  . S/P cardiac catheterization, Rt & Lt heart cath 06/05/2013 06/22/2013  . S/P CABG x 3 2010  . GERD (gastroesophageal reflux disease)   . Cataract   . Chronic kidney disease     Patient reports he is seeing Kidney doctor in September    Past Surgical History  Procedure Laterality Date  . Coronary artery bypass graft  01/26/2009    LIMA to LAD, SVG to circumflex, SVG to PDA  . Aortic valve replacement  01/26/2009    [facility] Ease bioprosthetic 85mm valve  . Transthoracic echocardiogram  09/2011    grade 1 diastolic dysfunction; increasing valve gradient; calcified MV annulus   . Cardiac catheterization  06/05/2013    native 3 vessel  disease; patent LIMA to LAD, SVG to OM and SVG to PDA; Elevated right and left heart filling pressures, with predominantly pulmonary venous hypertension, normal PVR  . Transthoracic echocardiogram  06/03/2013    EF 25-30%, mod conc. hypertrophy, grade 3 diastolic dysfunction; LA mod dilated; calcified MV annulus; transaortic gradients are normal for the bioprosthetic valve; inf vena cava dilated (elevated CVP) - LifeVest  . Left and right heart catheterization with coronary angiogram N/A 06/05/2013    Procedure: LEFT AND RIGHT HEART CATHETERIZATION WITH CORONARY ANGIOGRAM;  Surgeon: Leonie Man, MD;  [facility] LAB;  Service: Cardiovascular;  Laterality: N/A;  . Graft(s) angiogram  06/05/2013    Procedure: GRAFT(S) Cyril Loosen;  Surgeon: Leonie Man, MD;  Location: [facility] C[facility] LAB;  Service: Cardiovascular;;    There were no vitals filed for this visit.  Visit Diagnosis:  Open toe wound, subsequent encounter  Unsteadiness  Difficulty walking      Subjective Assessment - 05/13/15 1025    Subjective Pt stated he has increased feelings to toe   Currently in Pain? Yes   Pain Score 1    Pain Location Toe (Comment which one)  Rt great toe   Pain Orientation Right            Wound Therapy -  05/13/15 1025    Subjective Pt stated he has increased feelings to toe   Patient and Family Stated Goals wound to heal    Pain Assessment 0-10   Wound Properties Date First Assessed: 04/22/15 Time First Assessed: 1020 Wound Type: Other (Comment) , open wound   Location: Toe (Comment  which one) , R great toe   Location Orientation: Right Present on Admission: Yes   Dressing Type Silver dressings;Gauze (Comment)  acticoat silver, 2x2 and gauze   Dressing Status Old drainage   Dressing Change Frequency Monday, Wednesday, Friday   Site / Wound Assessment Granulation tissue;Yellow   % Wound base Red or Granulating 85%   % Wound base Yellow 15%   % Wound base Black 0%   Peri-wound  Assessment Edema;Erythema (blanchable)   Margins Epibole (rolled edges)   Closure None   Drainage Amount Minimal   Drainage Description Serous   Treatment Cleansed;Debridement (Selective)   Incision Properties Date First Assessed: 04/23/15 Time First Assessed: 1738 Location: Groin Location Orientation: Left Present on Admission: No   Selective Debridement - Location R great toe    Selective Debridement - Tools Used Forceps;Scalpel   Selective Debridement - Tissue Removed slough, callous    Wound Therapy - Clinical Statement Improved granualtion continues to approximate with good coloration perimeter of wound.  No redness or heat or other signs of infection noted,  Continued with silver dressings and gauze.  No reports of increased pain through session.   Wound Therapy - Functional Problem List non-healing wound complicated with DM and vascular compromise.    Factors Delaying/Impairing Wound Healing Altered sensation;Diabetes Mellitus;Infection - systemic/local;Multiple medical problems;Vascular compromise   Wound Therapy - Frequency 3X / week   Wound Plan Gcode due next session.  continue with debridement.  If any increased redness in forefoot appears, contact MD about possible culture.          Problem List Patient Active Problem List   Diagnosis Date Noted  . S/P angioplasty 04/23/2015  . Peripheral arterial occlusive disease 04/23/2015  . PAD (peripheral artery disease)   . Critical lower limb ischemia   . Toe ulcer, right   . Type II or unspecified type diabetes mellitus without mention of complication, uncontrolled 11/27/2013  . Acute respiratory failure with hypoxia 11/26/2013  . CAP (community acquired pneumonia) 11/26/2013  . CHF exacerbation 11/25/2013  . Cardiomyopathy, ischemic 06/13/2013  . At risk for sudden cardiac death 07/05/13  . S/P cardiac catheterization, Rt & Lt heart cath 06/05/2013 05-Jul-2013  . Acute on chronic combined systolic and diastolic HF (heart  failure), NYHA class 3 06/04/2013  . Atrial flutter with rapid ventricular response 06/02/2013  . Acute renal failure: Cr up to 1.8 06/02/2013  . Hyperkalemia 06/02/2013    Class: Acute  . LBBB (left bundle branch block) 06/01/2013  . Hypotension- secondary to AF and Diltiazem Rx 06/01/2013  . Obesity (BMI 30-39.9) 06/01/2013  . Acute on chronic diastolic heart failure - due to Afib AVR 06/01/2013  . Atrial fibrillation with RVR- converted with Amiodarone 05/31/2013  . Stroke- Lt brain 2003 04/08/2013  . Hemiplegia affecting right dominant side 04/08/2013  . Cataract 04/08/2013  . GERD (gastroesophageal reflux disease) 04/08/2013  . S/P CABG x 3 - 2010 04/08/2013  . S/P AVR-tissue, 2010 04/08/2013  . DM2 (diabetes mellitus, type 2) 04/08/2013  . Dyslipidemia 04/08/2013  . SUBACROMIAL BURSITIS, LEFT 11/05/2009   Ihor Austin, LPTA; CBIS [phone]  Aldona Lento 05/13/2015, 10:35 AM  [place]  [facility] [place], [name], [address] Phone: [phone]   Fax:  [phone]

## 2015-05-14 ENCOUNTER — Other Ambulatory Visit: Payer: Self-pay | Admitting: Internal Medicine

## 2015-05-14 NOTE — Telephone Encounter (Signed)
REFILL 

## 2015-05-15 ENCOUNTER — Ambulatory Visit (HOSPITAL_COMMUNITY): Payer: Medicare Other | Admitting: Physical Therapy

## 2015-05-15 DIAGNOSIS — S91109D Unspecified open wound of unspecified toe(s) without damage to nail, subsequent encounter: Secondary | ICD-10-CM

## 2015-05-15 DIAGNOSIS — R2681 Unsteadiness on feet: Secondary | ICD-10-CM | POA: Diagnosis not present

## 2015-05-15 DIAGNOSIS — R262 Difficulty in walking, not elsewhere classified: Secondary | ICD-10-CM

## 2015-05-15 NOTE — Therapy (Addendum)
[name], [name], [address] Phone: [phone]   Fax:  [phone]  Physical Therapy Treatment  Patient Details  Name: [name] MRN: [id number] Date of Birth: October 07, 1945 Referring Provider:  Sanjuana Kava, MD  Encounter Date: 05/15/2015      PT End of Session - 05/15/15 1017    Visit Number 10   Number of Visits 24   Date for PT Re-Evaluation 05/20/15   Authorization Type UHC Medicare    Authorization Time Period 04/22/15 to 06/21/05   Authorization - Visit Number 10   Authorization - Number of Visits 20   PT Start Time 0920   PT Stop Time 1005   PT Time Calculation (min) 45 min   Activity Tolerance Patient tolerated treatment well      Past Medical History  Diagnosis Date  . Hypertension   . Diabetes mellitus     type 2   . History of valve replacement 2010    [facility] Ease bioprosthetic 39m  . Thyroid disease   . Stroke 2003    R-sided weakness & upper extremity swelling   . Coronary artery disease   . Dyslipidemia   . At risk for sudden cardiac death 12014-11-06 . PAF (paroxysmal atrial fibrillation) 05/2013    converted with amiodarone to SR  . Chronic anticoagulation 05/2013    started  . S/P cardiac catheterization, Rt & Lt heart cath 06/05/2013 106-Nov-2014 . S/P CABG x 3 2010  . GERD (gastroesophageal reflux disease)   . Cataract   . Chronic kidney disease     Patient reports he is seeing Kidney doctor in September    Past Surgical History  Procedure Laterality Date  . Coronary artery bypass graft  01/26/2009    LIMA to LAD, SVG to circumflex, SVG to PDA  . [facility]  [facility]/2009    [facility]  . Transthoracic echocardiogram  09/2011    grade 1 diastolic dysfunction; increasing valve gradient; calcified MV annulus   . Cardiac catheterization  06/05/2013    native 3 vessel disease; patent LIMA to LAD, SVG to OM and SVG to  PDA; Elevated right and left heart filling pressures, with predominantly pulmonary venous hypertension, normal PVR  . Transthoracic echocardiogram  06/03/2013    EF 25-30%, mod conc. hypertrophy, grade 3 diastolic dysfunction; LA mod dilated; calcified MV annulus; transaortic gradients are normal for the bioprosthetic valve; inf vena cava dilated (elevated CVP) - LifeVest  . Left and right heart catheterization with coronary angiogram N/A 06/05/2013    Procedure: LEFT AND RIGHT HEART CATHETERIZATION WITH CORONARY ANGIOGRAM;  Surgeon: DaLeonie ManMD;  [facility];  Service: Cardiovascular;  Laterality: N/A;  . Graft(s) angiogram  06/05/2013    Procedure: GRAFT(S) ANCyril Loosen Surgeon: DaLeonie ManMD;  [facility];  Service: Cardiovascular;;    There were no vitals filed for this visit.  Visit Diagnosis:  Open toe wound, subsequent encounter  Unsteadiness  Difficulty walking      Subjective Assessment - 05/15/15 1009    Subjective Pt states that his toe has been hurting for the past several days; feels that it is due to the feeling coming back to his foot.    Currently in Pain? Yes   Pain Score 4    Pain Location Toe (Comment which one)  great    Pain Orientation Right  Wound Therapy - 06-04-2015 1009    Subjective Pt stated he has increased feelings to toe   Patient and Family Stated Goals wound to heal    Wound Properties Date First Assessed: 04/22/15 Time First Assessed: 1020 Wound Type: Other (Comment) , open wound   Location: Toe (Comment  which one) , R great toe   Location Orientation: Right Present on Admission: Yes   Dressing Type Gauze (Comment);Moist to moist  acticoat silver, 2x2 and gauze   Dressing Status Old drainage   Dressing Change Frequency Monday, Wednesday, Friday   Site / Wound Assessment Granulation tissue;Yellow   % Wound base Red or Granulating 50%  after debridement 90% granulated    % Wound  base Yellow 50%   % Wound base Black 0%   Peri-wound Assessment Edema  noted edema B LE pt does not have compression stockings    Margins Epibole (rolled edges)   Closure None   Drainage Amount Minimal   Drainage Description Serous   Treatment Cleansed;Debridement (Selective)   Incision Properties Date First Assessed: 04/23/15 Time First Assessed: 1738 Location: Groin Location Orientation: Left Present on Admission: No   Selective Debridement - Location R great toe    Selective Debridement - Tools Used Forceps;Scalpel   Selective Debridement - Tissue Removed slough, callous    Wound Therapy - Clinical Statement Improved granualtion continues to approximate with good coloration perimeter of wound.  No redness or heat or other signs of infection noted, Discontinued silver dressing    Wound Therapy - Functional Problem List non-healing wound complicated with DM and vascular compromise.    Factors Delaying/Impairing Wound Healing Altered sensation;Diabetes Mellitus;Infection - systemic/local;Multiple medical problems;Vascular compromise   Wound Therapy - Frequency 3X / week   Wound Plan Continue with current treatment may decrease to twice a week if wound continues to improve in granulation   Decrease Necrotic Tissue to 20%   Decrease Necrotic Tissue - Progress Met   Increase Granulation Tissue to 80%   Increase Granulation Tissue - Progress Partly met   Decrease Length/Width/Depth by (cm) 1.0   Decrease Length/Width/Depth - Progress Progressing toward goal   Patient/Family will be able to  independently dress and inspect wound for signs of infection   Patient/Family Instruction Goal - Progress Met   Additional Wound Therapy Goal Full wound healing    Additional Wound Therapy Goal - Progress Progressing toward goal                                G-Codes - 2015/06/04 1017    Functional Assessment Tool Used Based on skilled clinical assessment of wound healing status,  gait/mobility    Functional Limitation Other PT primary   Other PT Primary Current Status (A2505) At least 60 percent but less than 80 percent impaired, limited or restricted   Other PT Primary Goal Status (L9767) At least 60 percent but less than 80 percent impaired, limited or restricted      Problem List Patient Active Problem List   Diagnosis Date Noted  . S/P angioplasty 04/23/2015  . Peripheral arterial occlusive disease 04/23/2015  . PAD (peripheral artery disease)   . Critical lower limb ischemia   . Toe ulcer, right   . Type II or unspecified type diabetes mellitus without mention of complication, uncontrolled 11/27/2013  . Acute respiratory failure with hypoxia 11/26/2013  . CAP (community acquired pneumonia) 11/26/2013  . CHF exacerbation 11/25/2013  .  Cardiomyopathy, ischemic 06/13/2013  . At risk for sudden cardiac death 07-Jul-2013  . S/P cardiac catheterization, Rt & Lt heart cath 06/05/2013 07/07/13  . Acute on chronic combined systolic and diastolic HF (heart failure), NYHA class 3 06/04/2013  . Atrial flutter with rapid ventricular response 06/02/2013  . Acute renal failure: Cr up to 1.8 06/02/2013  . Hyperkalemia 06/02/2013    Class: Acute  . LBBB (left bundle branch block) 06/01/2013  . Hypotension- secondary to AF and Diltiazem Rx 06/01/2013  . Obesity (BMI 30-39.9) 06/01/2013  . Acute on chronic diastolic heart failure - due to Afib AVR 06/01/2013  . Atrial fibrillation with RVR- converted with Amiodarone 05/31/2013  . Stroke- Lt brain 2003 04/08/2013  . Hemiplegia affecting right dominant side 04/08/2013  . Cataract 04/08/2013  . GERD (gastroesophageal reflux disease) 04/08/2013  . S/P CABG x 3 - 2010 04/08/2013  . S/P AVR-tissue, 2010 04/08/2013  . DM2 (diabetes mellitus, type 2) 04/08/2013  . Dyslipidemia 04/08/2013  . SUBACROMIAL BURSITIS, LEFT 11/05/2009  Rayetta Humphrey, PT CLT [phone] 05/15/2015, 10:18 AM  [place] [address] [place], [name], [address] Phone: [phone]   Fax:  [phone]   Physical Therapy Progress Note  Dates of Reporting Period: 04/18/2015 to 05/15/2015  Objective Reports of Subjective Statement:Pt has had a stent placed in his leg to improve blood flow.  Pt continues to have tunneling in center of toe as well as undermining along edges of toe but both are decreasing.  Pt verbalized increased feeling where the foot was numb.  Objective Measurements: wound now able to be debrided to 90% granulation was 50%  Goal Update: Wound size has not decreased but undermining, tunneling have decreased and granulating tissue has increased   Plan: Will decrease to two times a week for 6 more weeks   Reason Skilled Services are Required: Pt has had a non-healing wound on great toe that is complicated by DM, vascular compromise.  He is at a high risk for infection.     Rayetta Humphrey, [place] CLT [phone]

## 2015-05-18 ENCOUNTER — Ambulatory Visit (HOSPITAL_COMMUNITY): Payer: Medicare Other | Attending: Orthopaedic Surgery | Admitting: Physical Therapy

## 2015-05-18 DIAGNOSIS — X58XXXD Exposure to other specified factors, subsequent encounter: Secondary | ICD-10-CM | POA: Diagnosis not present

## 2015-05-18 DIAGNOSIS — S91109D Unspecified open wound of unspecified toe(s) without damage to nail, subsequent encounter: Secondary | ICD-10-CM | POA: Insufficient documentation

## 2015-05-18 DIAGNOSIS — R262 Difficulty in walking, not elsewhere classified: Secondary | ICD-10-CM | POA: Diagnosis not present

## 2015-05-18 DIAGNOSIS — R2681 Unsteadiness on feet: Secondary | ICD-10-CM | POA: Diagnosis not present

## 2015-05-18 NOTE — Therapy (Addendum)
[place], [name], [address] Phone: [phone]   Fax:  [phone]  Wound Care Therapy  Patient Details  Name: [name] MRN: [id number] Date of Birth: Feb 08, 1946 Referring Provider:  Sanjuana Kava, MD  Encounter Date: 05/18/2015      PT End of Session - 05/18/15 1014    Visit Number 11   Number of Visits 24   Date for PT Re-Evaluation 05/20/15   Authorization Type [employer]    Authorization Time Period 04/22/15 to 06/21/05   Authorization - Visit Number 11   Authorization - Number of Visits 20   PT Start Time 0932   PT Stop Time 1010   PT Time Calculation (min) 38 min   Activity Tolerance Patient tolerated treatment well   Behavior During Therapy [facility] for tasks assessed/performed      Past Medical History  Diagnosis Date  . Hypertension   . Diabetes mellitus     type 2   . History of valve replacement 2010    [facility] Ease bioprosthetic 2m  . Thyroid disease   . Stroke 2003    R-sided weakness & upper extremity swelling   . Coronary artery disease   . Dyslipidemia   . At risk for sudden cardiac death 12014/11/12 . PAF (paroxysmal atrial fibrillation) 05/2013    converted with amiodarone to SR  . Chronic anticoagulation 05/2013    started  . S/P cardiac catheterization, Rt & Lt heart cath 06/05/2013 111/12/14 . S/P CABG x 3 2010  . GERD (gastroesophageal reflux disease)   . Cataract   . Chronic kidney disease     Patient reports he is seeing Kidney doctor in September    Past Surgical History  Procedure Laterality Date  . Coronary artery bypass graft  01/26/2009    LIMA to LAD, SVG to circumflex, SVG to PDA  . Aortic valve replacement  01/26/2009    [facility]  . Transthoracic echocardiogram  09/2011    grade 1 diastolic dysfunction; increasing valve gradient; calcified MV annulus   . Cardiac catheterization  06/05/2013    native 3  vessel disease; patent LIMA to LAD, SVG to OM and SVG to PDA; Elevated right and left heart filling pressures, with predominantly pulmonary venous hypertension, normal PVR  . Transthoracic echocardiogram  06/03/2013    EF 25-30%, mod conc. hypertrophy, grade 3 diastolic dysfunction; LA mod dilated; calcified MV annulus; transaortic gradients are normal for the bioprosthetic valve; inf vena cava dilated (elevated CVP) - LifeVest  . Left and right heart catheterization with coronary angiogram N/A 06/05/2013    Procedure: LEFT AND RIGHT HEART CATHETERIZATION WITH CORONARY ANGIOGRAM;  Surgeon: DaLeonie ManMD;  [facility];  Service: Cardiovascular;  Laterality: N/A;  . Graft(s) angiogram  06/05/2013    Procedure: GRAFT(S) ANCyril Loosen Surgeon: DaLeonie ManMD;  [facility];  Service: Cardiovascular;;    There were no vitals filed for this visit.  Visit Diagnosis:  Open toe wound, subsequent encounter  Unsteadiness  Difficulty walking      Subjective Assessment - 05/18/15 1009    Subjective Pt states his toe is still hurting. No more or less than before.    Pain Score 4    Pain Location Toe (Comment which one)  great   Pain Orientation Right  Wound Therapy - 05/18/15 1009    Subjective Pt continues to have pain in his toe; no more or less    Patient and Family Stated Goals wound to heal    Wound Properties Date First Assessed: 04/22/15 Time First Assessed: 1020 Wound Type: Other (Comment) , open wound   Location: Toe (Comment  which one) , R great toe   Location Orientation: Right Present on Admission: Yes   Dressing Type Gauze (Comment);Silver hydrofiber  acticoat silver, 2x2 and gauze   Dressing Status Old drainage   Dressing Change Frequency Monday, Wednesday, Friday   Site / Wound Assessment Granulation tissue;Yellow   % Wound base Red or Granulating 60%  after debridement 100% granulated    % Wound base Yellow 40%   %  Wound base Black 0%   Peri-wound Assessment Edema  noted edema B LE pt does not have compression stockings    Wound Width (cm) 2.4 cm   Wound Depth (cm) 1 cm   Tunneling (cm) inferiorly along medial edge 1.0cm as well as center at least .3 cm    Margins Epibole (rolled edges)   Closure None   Drainage Amount Minimal   Drainage Description Serosanguineous   Treatment Cleansed;Debridement (Selective)   Incision Properties Date First Assessed: 04/23/15 Time First Assessed: 1738 Location: Groin Location Orientation: Left Present on Admission: No   Selective Debridement - Location R great toe    Selective Debridement - Tools Used Forceps;Scissors   Selective Debridement - Tissue Removed slough, callous    Wound Therapy - Clinical Statement Improved granualtion continues but toe is now red will go back to silver dressing.    Wound Therapy - Functional Problem List non-healing wound complicated with DM and vascular compromise.    Factors Delaying/Impairing Wound Healing Altered sensation;Diabetes Mellitus;Infection - systemic/local;Multiple medical problems;Vascular compromise   Wound Therapy - Frequency 3X / week   Wound Plan Continue with current treatment may decrease to twice a week if wound continues to improve in granulation   Dressing  moistened silverhydrofiber, vaseline to perimeter followed by 2x2 and 2" gauze to toe only.    Decrease Necrotic Tissue to 20%   Decrease Necrotic Tissue - Progress Met   Increase Granulation Tissue to 80%   Increase Granulation Tissue - Progress Met   Decrease Length/Width/Depth by (cm) 1.0   Decrease Length/Width/Depth - Progress Progressing toward goal   Patient/Family will be able to  independently dress and inspect wound for signs of infection   Patient/Family Instruction Goal - Progress Met   Additional Wound Therapy Goal Full wound healing    Additional Wound Therapy Goal - Progress Progressing toward goal                 PT Education  - 05/18/15 1014    Education provided Yes   Education Details if dressing is cutting into LE tatke the dressing off.   Person(s) Educated Patient   Methods Explanation   Comprehension Verbalized understanding                     Problem List Patient Active Problem List   Diagnosis Date Noted  . S/P angioplasty 04/23/2015  . Peripheral arterial occlusive disease (HCC) 04/23/2015  . PAD (peripheral artery disease) (HCC)   . Critical lower limb ischemia   . Toe ulcer, right (HCC)   . Type II or unspecified type diabetes mellitus without mention of complication, uncontrolled 11/27/2013  . Acute respiratory failure with hypoxia (HCC)   11/26/2013  . CAP (community acquired pneumonia) 11/26/2013  . CHF exacerbation (HCC) 11/25/2013  . Cardiomyopathy, ischemic 06/13/2013  . At risk for sudden cardiac death 06/07/2013  . S/P cardiac catheterization, Rt & Lt heart cath 06/05/2013 06/07/2013  . Acute on chronic combined systolic and diastolic HF (heart failure), NYHA class 3 (HCC) 06/04/2013  . Atrial flutter with rapid ventricular response (HCC) 06/02/2013  . Acute renal failure: Cr up to 1.8 06/02/2013  . Hyperkalemia 06/02/2013    Class: Acute  . LBBB (left bundle branch block) 06/01/2013  . Hypotension- secondary to AF and Diltiazem Rx 06/01/2013  . Obesity (BMI 30-39.9) 06/01/2013  . Acute on chronic diastolic heart failure - due to Afib AVR 06/01/2013  . Atrial fibrillation with RVR- converted with Amiodarone 05/31/2013  . Stroke- Lt brain 2003 04/08/2013  . Hemiplegia affecting right dominant side (HCC) 04/08/2013  . Cataract 04/08/2013  . GERD (gastroesophageal reflux disease) 04/08/2013  . S/P CABG x 3 - 2010 04/08/2013  . S/P AVR-tissue, 2010 04/08/2013  . DM2 (diabetes mellitus, type 2) (HCC) 04/08/2013  . Dyslipidemia 04/08/2013  . SUBACROMIAL BURSITIS, LEFT 11/05/2009    Cynthia Russell, PT CLT [phone] 05/18/2015, 10:16 AM  [facility] [address] [place], [place], [address] Phone: [phone]   Fax:  [phone]       

## 2015-05-19 ENCOUNTER — Ambulatory Visit
Admission: RE | Admit: 2015-05-19 | Discharge: 2015-05-19 | Disposition: A | Discharge status: Home / Self Care | Payer: Medicare Other | Source: Ambulatory Visit | Attending: Interventional Radiology | Admitting: Interventional Radiology

## 2015-05-19 ENCOUNTER — Other Ambulatory Visit (HOSPITAL_COMMUNITY): Payer: Self-pay | Admitting: Interventional Radiology

## 2015-05-19 DIAGNOSIS — I739 Peripheral vascular disease, unspecified: Secondary | ICD-10-CM

## 2015-05-19 DIAGNOSIS — S91101D Unspecified open wound of right great toe without damage to nail, subsequent encounter: Secondary | ICD-10-CM | POA: Diagnosis not present

## 2015-05-20 ENCOUNTER — Ambulatory Visit (HOSPITAL_COMMUNITY): Payer: Medicare Other

## 2015-05-20 DIAGNOSIS — R262 Difficulty in walking, not elsewhere classified: Secondary | ICD-10-CM

## 2015-05-20 DIAGNOSIS — S91109D Unspecified open wound of unspecified toe(s) without damage to nail, subsequent encounter: Secondary | ICD-10-CM | POA: Diagnosis not present

## 2015-05-20 DIAGNOSIS — R2681 Unsteadiness on feet: Secondary | ICD-10-CM | POA: Diagnosis not present

## 2015-05-20 NOTE — Progress Notes (Signed)
Chief Complaint: Patient was seen in consultation today for  Chief Complaint  Patient presents with  . Follow-up    RLE Angiography & Revascularization   at the request of Dr. Felicie Morn  Referring Physician(s): Felicie Morn  History of Present Illness: [name] is a 69 y.o. male with a poorly healing right great toe wound in the setting of severe occlusive PAD consistent with [place] category 5 CLI.  He underwent endovascular reconstruction of his occluded right SFA on 04/23/15.  [name] is doing well.  His right leg feels great and he reports his toe wound is beginning to heal.  He continues to have mild to moderate claudication on the left but asserts that this does not really bother him or prevent him from doing what he wants to do.    He has no active complaints today.     Past Medical History  Diagnosis Date  . Hypertension   . Diabetes mellitus     type 2   . History of valve replacement 2010    [facility] Ease bioprosthetic 22mm  . Thyroid disease   . Stroke 2003    R-sided weakness & upper extremity swelling   . Coronary artery disease   . Dyslipidemia   . At risk for sudden cardiac death 2013-06-13  . PAF (paroxysmal atrial fibrillation) 05/2013    converted with amiodarone to SR  . Chronic anticoagulation 05/2013    started  . S/P cardiac catheterization, Rt & Lt heart cath 06/05/2013 06-13-13  . S/P CABG x 3 2010  . GERD (gastroesophageal reflux disease)   . Cataract   . Chronic kidney disease     Patient reports he is seeing Kidney doctor in September    Past Surgical History  Procedure Laterality Date  . Coronary artery bypass graft  01/26/2009    LIMA to LAD, SVG to circumflex, SVG to PDA  . Aortic valve replacement  01/26/2009    [facility] Ease bioprosthetic 83mm valve  . Transthoracic echocardiogram  09/2011    grade 1 diastolic dysfunction; increasing valve gradient; calcified MV annulus   . Cardiac  catheterization  06/05/2013    native 3 vessel disease; patent LIMA to LAD, SVG to OM and SVG to PDA; Elevated right and left heart filling pressures, with predominantly pulmonary venous hypertension, normal PVR  . Transthoracic echocardiogram  06/03/2013    EF 25-30%, mod conc. hypertrophy, grade 3 diastolic dysfunction; LA mod dilated; calcified MV annulus; transaortic gradients are normal for the bioprosthetic valve; inf vena cava dilated (elevated CVP) - LifeVest  . Left and right heart catheterization with coronary angiogram N/A 06/05/2013    Procedure: LEFT AND RIGHT HEART CATHETERIZATION WITH CORONARY ANGIOGRAM;  Surgeon: Leonie Man, MD;  Location: [facility] CATH LAB;  Service: Cardiovascular;  Laterality: N/A;  . Graft(s) angiogram  06/05/2013    Procedure: GRAFT(S) Cyril Loosen;  Surgeon: Leonie Man, MD;  Location: [facility] CATH LAB;  Service: Cardiovascular;;    Allergies: Review of patient's allergies indicates no known allergies.  Medications: Prior to Admission medications   Medication Sig Start Date End Date Taking? Authorizing Provider  acetaminophen (TYLENOL) 500 MG tablet Take 1,000 mg by mouth every 6 (six) hours as needed. For pain/headaches    Yes Historical Provider, MD  ALPRAZolam Duanne Moron) 1 MG tablet Take 1 mg by mouth 4 (four) times daily as needed for anxiety.    Yes Historical Provider, MD  amiodarone (PACERONE) 200 MG tablet Take  1 tablet (200 mg total) by mouth daily. 10/30/14  Yes Pixie Casino, MD  apixaban (ELIQUIS) 5 MG TABS tablet Take 1 tablet (5 mg total) by mouth 2 (two) times daily. 04/15/15  Yes Pixie Casino, MD  atorvastatin (LIPITOR) 20 MG tablet Take 1 tablet (20 mg total) by mouth daily at 6 PM. 10/30/14  Yes Pixie Casino, MD  bismuth subsalicylate (PEPTO BISMOL) 262 MG/15ML suspension Take 30 mLs by mouth every 6 (six) hours as needed for indigestion.   Yes Historical Provider, MD  clopidogrel (PLAVIX) 75 MG tablet Take 1 tablet (75 mg total) by mouth  daily. 04/25/15  Yes Hedy Jacob, PA-C  docusate sodium (COLACE) 100 MG capsule Take 100 mg by mouth 2 (two) times daily.     Yes Historical Provider, MD  fenofibrate (TRICOR) 145 MG tablet Take 1 tablet (145 mg total) by mouth daily. 10/30/14  Yes Pixie Casino, MD  ferrous sulfate 325 (65 FE) MG EC tablet Take 325 mg by mouth every morning.   Yes Historical Provider, MD  gabapentin (NEURONTIN) 300 MG capsule Take 300 mg by mouth 2 (two) times daily.    Yes Historical Provider, MD  HYDROcodone-acetaminophen (NORCO) 10-325 MG per tablet Take 1 tablet by mouth as needed for severe pain.  03/31/14  Yes Historical Provider, MD  insulin glargine (LANTUS) 100 UNIT/ML injection Inject 60-70 Units into the skin 2 (two) times daily. Takes70 units in the morning and 60 units in the evening   Yes Historical Provider, MD  insulin lispro (HUMALOG) 100 UNIT/ML injection Inject 50-60 Units into the skin 3 (three) times daily before meals. 50U BID and 60U QHS   Yes Historical Provider, MD  levothyroxine (SYNTHROID, LEVOTHROID) 25 MCG tablet Take 25 mcg by mouth daily.     Yes Historical Provider, MD  metoprolol succinate (TOPROL-XL) 25 MG 24 hr tablet Take 1 tablet (25 mg total) by mouth daily. 10/30/14  Yes Pixie Casino, MD  Omega-3 Fatty Acids (FISH OIL PO) Take 1 capsule by mouth 2 (two) times daily.    Yes Historical Provider, MD  pantoprazole (PROTONIX) 40 MG tablet Take 1 tablet (40 mg total) by mouth 2 (two) times daily. 10/30/14  Yes Pixie Casino, MD  silver sulfADIAZINE (SILVADENE) 1 % cream Apply 1 application topically 4 (four) times daily.   Yes Historical Provider, MD  torsemide (DEMADEX) 10 MG tablet Take 1 tablet (10 mg total) by mouth daily. NEED OV. 05/14/15  Yes Pixie Casino, MD  celecoxib (CELEBREX) 200 MG capsule Take 1 capsule by mouth 2 (two) times daily. 04/07/14   Historical Provider, MD  doxycycline (VIBRA-TABS) 100 MG tablet Take 100 mg by mouth 2 (two) times daily.    Historical  Provider, MD     Family History  Problem Relation Age of Onset  . Heart attack Mother   . Liver disease Brother   . Diabetes Sister     x4    Social History   Social History  . Marital Status: Divorced    Spouse Name: N/A  . Number of Children: 4  . Years of Education: N/A   Occupational History  . 4    Social History Main Topics  . Smoking status: Former Smoker    Types: Cigarettes    Quit date: 08/15/2001  . Smokeless tobacco: Former Systems developer    Types: Chew  . Alcohol Use: No  . Drug Use: No  . Sexual Activity: Not Currently  Other Topics Concern  . Not on file   Social History Narrative    Review of Systems: A 12 point ROS discussed and pertinent positives are indicated in the HPI above.  All other systems are negative.  Review of Systems  Vital Signs: BP 141/69 mmHg  Pulse 70  Temp(Src) 98.3 F (36.8 C) (Oral)  Resp 15  Ht 5\' 10"  (1.778 m)  Wt 250 lb (113.399 kg)  BMI 35.87 kg/m2  SpO2 95%  Physical Exam  Constitutional: He appears well-developed and well-nourished. No distress.  HENT:  Head: Normocephalic and atraumatic.  Eyes: No scleral icterus.  Cardiovascular: Normal rate and regular rhythm.   Pulmonary/Chest: Effort normal.  Abdominal: Soft.  Neurological: He is alert. He has normal reflexes.  Skin: Skin is warm and dry.  Bandaged right great toe wound   Nursing note and vitals reviewed.    Imaging: Ir Angiogram Extremity Right  04/23/2015   CLINICAL DATA:  69 year old male with rather for category 5 critical limb ischemia involving the right lower extremity. He has a slowly healing ulcer on the right great toe. 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 Additionally, he likely has significant runoff disease as well as more distal femoral popliteal disease. Presents today for endovascular revascularization.  EXAM:  RIGHT EXTREMITY ARTERIOGRAPHY; IR ULTRASOUND GUIDANCE VASC ACCESS RIGHT; IR FEM POP ART STENT INC PTA  Date: 04/23/2015  PROCEDURE: 1. Ultrasound-guided puncture left common femoral artery 2. Pelvic arteriogram 3. Up in over catheterization of the right common femoral artery 4. Multi station right lower extremity arteriogram 5. Recanalization of occluded superficial femoral artery 6. Angioplasty and stenting of the popliteal and superficial femoral arteries Interventional Radiologist:  Criselda Peaches, MD  ANESTHESIA/SEDATION: Moderate (conscious) sedation was used. 6 mg Versed, 300 mcg Fentanyl were administered intravenously. The patient's vital signs were monitored continuously by radiology nursing throughout the procedure.  Sedation Time: 240 Minutes  MEDICATIONS: 9000 units heparin administered intravenously during the course of the procedure  FLUOROSCOPY TIME:  52 minutes  4206 mGy  CONTRAST:  152mL VISIPAQUE IODIXANOL 320 MG/ML IV SOLN, 169mL VISIPAQUE IODIXANOL 320 MG/ML IV SOLN  TECHNIQUE: Informed consent was obtained from the patient following explanation of the procedure, risks, benefits and alternatives. The patient understands, agrees and consents for the procedure. All questions were addressed. A time out was performed.  Maximal barrier sterile technique utilized including caps, mask, sterile gowns, sterile gloves, large sterile drape, hand hygiene, and Betadine skin prep.  The left femoral artery was interrogated with ultrasound and found to be patent. Using standard technique, the vessel was punctured with a 21 Gy inch micropuncture needle after obtaining local anesthesia with 1% lidocaine. Images obtained stored for the medical record. The transitional micro sheath was used to place a 0.035 inch wire into the abdominal aorta. The micro sheath was then exchanged for a working 5 [place] vascular sheath. An Omni flush catheter was advanced into the aortic bifurcation and initial distal aortography  and pelvic arteriography with CO2 was performed. There is scattered atherosclerotic disease but no focal stenosis involving the aortic or iliac systems. The right superficial femoral artery is occluded beginning approximately 1 cm beyond the origin.  The sauce Omni flush catheter was used to go up in over the bifurcation and then exchanged for an angled catheter which was positioned in the common femoral artery. A multi station right lower  extremity arteriogram was performed. The superficial femoral artery is occluded for approximately 30 cm be for reconstituted Ing in the mid thigh. Distal to this, there are multiple moderate to high-grade stenoses involving the distal superficial femoral artery and above the knee popliteal artery. The origin of the anterior tibial artery is patent but the vessel occludes proximally the tibioperoneal trunk is also occluded. A collateral vessels supplies flow to both the posterior tibial and dorsalis pedis arteries.  Using the angled catheter and Bentson wire, the catheter was advanced intraluminally through the superficial femoral artery occlusion and into the patent vessel in the mid thigh. This was confirmed with a gentle hand injection of contrast material.  The 0.035 inch wire was then exchanged for a 0.014 wire. A Nav6 distal embolization protection device was then deployed above the knee. The above the knee popliteal artery and the superficial femoral artery was angioplastied to 5.5 mm using a 5.5 mm by 15 cm had numeral balloon. Angiography demonstrated restored patency through the SFA. However, there is still significant residual stenosis secondary to bulky calcified plaque as well as multiple small longitudinal dissections.  A 5.5 mm x 6 cm Supera stent was deployed in the region of the adductor canal at a site of residual significant stenosis secondary to calcified atherosclerotic plaque. The mid is super of the mid and proximal superficial femoral artery will also  stented in the region of high-grade stenosis and occlusion. A combination of overlapping a 6 mm x 15 cm and 6 mm x 8 cm Supera stents were deployed. The origin of the superficial femoral artery was stented with a 7 mm x 5 cm via Bond stent. This was post dilated to 7 mm using the must Ing balloon. Final arteriography demonstrates in-line flow to the popliteal artery without evidence of residual stenosis. Flow is brisk. A final runoff lateral ankle demonstrates persistent patency of the solitary runoff vessel. No evidence of distal embolization.  Hemostasis was attained with the assistance of a Cordis ExoSeal device.  COMPLICATIONS: None  Estimated blood loss: 100 mL  IMPRESSION: 1. Approximately 30 cm occluded segments of the right superficial femoral artery beginning 1 cm beyond the origin and extending into the mid thigh. 2. Multifocal significant stenoses in the distal superficial femoral and above the knee popliteal artery secondary to bulky calcified atherosclerotic plaque. 3. Severe runoff disease with complete occlusion of all 3 runoff vessels. There is reconstitution of both the dorsalis pedis and posterior tibial arteries at the ankle. 4. Successful recanalization of occluded segment of the superficial femoral artery. 5. Angioplasty and stent placement in the above the knee popliteal artery, and superficial femoral artery using a combination of Supera and Viabahn stents.  PLAN: 1. Load with Plavix and admitted overnight for observation. 2. Anticipate discharge home in the morning. Patient will require Plavix for the next 3 months. 3. Follow-up in clinic in 2-4 weeks with repeat ankle brachial indices to establish a new baseline.  Signed,  Criselda Peaches, MD  [facility]  [facility]   Electronically Signed   By: Jacqulynn Cadet M.D.   On: 04/23/2015 19:00   Ir Angiogram Selective Each Additional Vessel  04/25/2015   CLINICAL DATA:  69 year old male  with rather for category 5 critical limb ischemia involving the right lower extremity. He has a slowly healing ulcer on the right great toe. 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 Additionally, he likely has significant runoff disease as well as more distal femoral popliteal disease. Presents today for endovascular revascularization.  EXAM: RIGHT EXTREMITY ARTERIOGRAPHY; IR ULTRASOUND GUIDANCE VASC ACCESS RIGHT; IR FEM POP ART STENT INC PTA  Date: 04/23/2015  PROCEDURE: 1. Ultrasound-guided puncture left common femoral artery 2. Pelvic arteriogram 3. Up in over catheterization of the right common femoral artery 4. Multi station right lower extremity arteriogram 5. Recanalization of occluded superficial femoral artery 6. Angioplasty and stenting of the popliteal and superficial femoral arteries Interventional Radiologist:  Criselda Peaches, MD  ANESTHESIA/SEDATION: Moderate (conscious) sedation was used. 6 mg Versed, 300 mcg Fentanyl were administered intravenously. The patient's vital signs were monitored continuously by radiology nursing throughout the procedure.  Sedation Time: 240 Minutes  MEDICATIONS: 9000 units heparin administered intravenously during the course of the procedure  FLUOROSCOPY TIME:  52 minutes  4206 mGy  CONTRAST:  162mL VISIPAQUE IODIXANOL 320 MG/ML IV SOLN, 141mL VISIPAQUE IODIXANOL 320 MG/ML IV SOLN  TECHNIQUE: Informed consent was obtained from the patient following explanation of the procedure, risks, benefits and alternatives. The patient understands, agrees and consents for the procedure. All questions were addressed. A time out was performed.  Maximal barrier sterile technique utilized including caps, mask, sterile gowns, sterile gloves, large sterile drape, hand hygiene, and Betadine skin prep.  The left femoral artery was interrogated with ultrasound and  found to be patent. Using standard technique, the vessel was punctured with a 21 Gy inch micropuncture needle after obtaining local anesthesia with 1% lidocaine. Images obtained stored for the medical record. The transitional micro sheath was used to place a 0.035 inch wire into the abdominal aorta. The micro sheath was then exchanged for a working 5 [place] vascular sheath. An Omni flush catheter was advanced into the aortic bifurcation and initial distal aortography and pelvic arteriography with CO2 was performed. There is scattered atherosclerotic disease but no focal stenosis involving the aortic or iliac systems. The right superficial femoral artery is occluded beginning approximately 1 cm beyond the origin.  The sauce Omni flush catheter was used to go up in over the bifurcation and then exchanged for an angled catheter which was positioned in the common femoral artery. A multi station right lower extremity arteriogram was performed. The superficial femoral artery is occluded for approximately 30 cm be for reconstituted Ing in the mid thigh. Distal to this, there are multiple moderate to high-grade stenoses involving the distal superficial femoral artery and above the knee popliteal artery. The origin of the anterior tibial artery is patent but the vessel occludes proximally the tibioperoneal trunk is also occluded. A collateral vessels supplies flow to both the posterior tibial and dorsalis pedis arteries.  Using the angled catheter and Bentson wire, the catheter was advanced intraluminally through the superficial femoral artery occlusion and into the patent vessel in the mid thigh. This was confirmed with a gentle hand injection of contrast material.  The 0.035 inch wire was then exchanged for a 0.014 wire. A Nav6 distal embolization protection device was then deployed above the knee. The above the knee popliteal artery and the superficial femoral artery was angioplastied to 5.5 mm using a 5.5 mm by 15 cm had  numeral balloon. Angiography demonstrated restored patency through the SFA. However, there is still significant residual stenosis secondary to bulky calcified plaque as well as multiple small longitudinal dissections.  A 5.5 mm x 6 cm Supera stent was deployed in the region of the  adductor canal at a site of residual significant stenosis secondary to calcified atherosclerotic plaque. The mid is super of the mid and proximal superficial femoral artery will also stented in the region of high-grade stenosis and occlusion. A combination of overlapping a 6 mm x 15 cm and 6 mm x 8 cm Supera stents were deployed. The origin of the superficial femoral artery was stented with a 7 mm x 5 cm via Bond stent. This was post dilated to 7 mm using the must Ing balloon. Final arteriography demonstrates in-line flow to the popliteal artery without evidence of residual stenosis. Flow is brisk. A final runoff lateral ankle demonstrates persistent patency of the solitary runoff vessel. No evidence of distal embolization.  Hemostasis was attained with the assistance of a Cordis ExoSeal device.  COMPLICATIONS: None  Estimated blood loss: 100 mL  IMPRESSION: 1. Approximately 30 cm occluded segments of the right superficial femoral artery beginning 1 cm beyond the origin and extending into the mid thigh. 2. Multifocal significant stenoses in the distal superficial femoral and above the knee popliteal artery secondary to bulky calcified atherosclerotic plaque. 3. Severe runoff disease with complete occlusion of all 3 runoff vessels. There is reconstitution of both the dorsalis pedis and posterior tibial arteries at the ankle. 4. Successful recanalization of occluded segment of the superficial femoral artery. 5. Angioplasty and stent placement in the above the knee popliteal artery, and superficial femoral artery using a combination of Supera and Viabahn stents.  PLAN: 1. Load with Plavix and admitted overnight for observation. 2. Anticipate  discharge home in the morning. Patient will require Plavix for the next 3 months. 3. Follow-up in clinic in 2-4 weeks with repeat ankle brachial indices to establish a new baseline.  Signed,  Criselda Peaches, MD  [facility]  [facility]   Electronically Signed   By: Jacqulynn Cadet M.D.   On: 04/23/2015 19:00   [place] Low Ext Art Bil W/exercise  05/19/2015   CLINICAL DATA:  Recanalization of the right SFA with angioplasty and stent placement. Revascularization was for treatment of the right great toe ulcer.  EXAM: NONINVASIVE PHYSIOLOGIC VASCULAR STUDY OF BILATERAL LOWER EXTREMITIES WITH AND WITHOUT EXERCISE  TECHNIQUE: Evaluation of both lower extremities were performed at rest, including calculation of ankle-brachial indices, multiple segmental pressure evaluation, segmental Doppler and segmental pulse volume recording. Ankle brachial indices were also obtained following treadmill exercise. Patient walked on a treadmill for 2.5 min. Patient was unable to continue walking beyond the 2.5 min.  COMPARISON:  04/23/2015 and 03/02/2015  FINDINGS: RESTING  Right ABI: 0.87(previously 0.42)  Left ABI:  0.57 (previously 0.55)  Right lower extremity: Monophasic Doppler waveforms in the posterior tibial and dorsalis pedis artery.  Left lower extremity: Monophasic Doppler waveforms in the posterior tibial and dorsalis pedis arteries.  POST EXERCISE  Right ABI: 0.89  Left ABI: 0.28.  Left ABI returned to baseline after 8 min.  IMPRESSION: Markedly improved right ABI following revascularization. The right ABI is now 0.87, previously 0.42.  Post exercise left ABI suggests severe occlusive arterial disease.   Electronically Signed   By: Markus Daft M.D.   On: 05/19/2015 16:59   Ir Fem Pop Art Stent Inc Pta Mod Sed  04/23/2015   CLINICAL DATA:  69 year old male with rather for category 5 critical limb ischemia involving the right lower extremity. He has a slowly healing ulcer on  the right great toe. Imaging evaluation demonstrates a chronic occlusion of the right  superficial femoral artery beginning just beyond the origin and extending at least into the mid thigh. Additionally, he likely has significant runoff disease as well as more distal femoral popliteal disease. Presents today for endovascular revascularization.  EXAM: RIGHT EXTREMITY ARTERIOGRAPHY; IR ULTRASOUND GUIDANCE VASC ACCESS RIGHT; IR FEM POP ART STENT INC PTA  Date: 04/23/2015  PROCEDURE: 1. Ultrasound-guided puncture left common femoral artery 2. Pelvic arteriogram 3. Up in over catheterization of the right common femoral artery 4. Multi station right lower extremity arteriogram 5. Recanalization of occluded superficial femoral artery 6. Angioplasty and stenting of the popliteal and superficial femoral arteries Interventional Radiologist:  Criselda Peaches, MD  ANESTHESIA/SEDATION: Moderate (conscious) sedation was used. 6 mg Versed, 300 mcg Fentanyl were administered intravenously. The patient's vital signs were monitored continuously by radiology nursing throughout the procedure.  Sedation Time: 240 Minutes  MEDICATIONS: 9000 units heparin administered intravenously during the course of the procedure  FLUOROSCOPY TIME:  52 minutes  4206 mGy  CONTRAST:  160mL VISIPAQUE IODIXANOL 320 MG/ML IV SOLN, 194mL VISIPAQUE IODIXANOL 320 MG/ML IV SOLN  TECHNIQUE: Informed consent was obtained from the patient following explanation of the procedure, risks, benefits and alternatives. The patient understands, agrees and consents for the procedure. All questions were addressed. A time out was performed.  Maximal barrier sterile technique utilized including caps, mask, sterile gowns, sterile gloves, large sterile drape, hand hygiene, and Betadine skin prep.  The left femoral artery was interrogated with ultrasound and found to be patent. Using standard technique, the vessel was punctured with a 21 Gy inch micropuncture needle after  obtaining local anesthesia with 1% lidocaine. Images obtained stored for the medical record. The transitional micro sheath was used to place a 0.035 inch wire into the abdominal aorta. The micro sheath was then exchanged for a working 5 [place] vascular sheath. An Omni flush catheter was advanced into the aortic bifurcation and initial distal aortography and pelvic arteriography with CO2 was performed. There is scattered atherosclerotic disease but no focal stenosis involving the aortic or iliac systems. The right superficial femoral artery is occluded beginning approximately 1 cm beyond the origin.  The sauce Omni flush catheter was used to go up in over the bifurcation and then exchanged for an angled catheter which was positioned in the common femoral artery. A multi station right lower extremity arteriogram was performed. The superficial femoral artery is occluded for approximately 30 cm be for reconstituted Ing in the mid thigh. Distal to this, there are multiple moderate to high-grade stenoses involving the distal superficial femoral artery and above the knee popliteal artery. The origin of the anterior tibial artery is patent but the vessel occludes proximally the tibioperoneal trunk is also occluded. A collateral vessels supplies flow to both the posterior tibial and dorsalis pedis arteries.  Using the angled catheter and Bentson wire, the catheter was advanced intraluminally through the superficial femoral artery occlusion and into the patent vessel in the mid thigh. This was confirmed with a gentle hand injection of contrast material.  The 0.035 inch wire was then exchanged for a 0.014 wire. A Nav6 distal embolization protection device was then deployed above the knee. The above the knee popliteal artery and the superficial femoral artery was angioplastied to 5.5 mm using a 5.5 mm by 15 cm had numeral balloon. Angiography demonstrated restored patency through the SFA. However, there is still significant  residual stenosis secondary to bulky calcified plaque as well as multiple small longitudinal dissections.  A 5.5 mm x 6  cm Supera stent was deployed in the region of the adductor canal at a site of residual significant stenosis secondary to calcified atherosclerotic plaque. The mid is super of the mid and proximal superficial femoral artery will also stented in the region of high-grade stenosis and occlusion. A combination of overlapping a 6 mm x 15 cm and 6 mm x 8 cm Supera stents were deployed. The origin of the superficial femoral artery was stented with a 7 mm x 5 cm via Bond stent. This was post dilated to 7 mm using the must Ing balloon. Final arteriography demonstrates in-line flow to the popliteal artery without evidence of residual stenosis. Flow is brisk. A final runoff lateral ankle demonstrates persistent patency of the solitary runoff vessel. No evidence of distal embolization.  Hemostasis was attained with the assistance of a Cordis ExoSeal device.  COMPLICATIONS: None  Estimated blood loss: 100 mL  IMPRESSION: 1. Approximately 30 cm occluded segments of the right superficial femoral artery beginning 1 cm beyond the origin and extending into the mid thigh. 2. Multifocal significant stenoses in the distal superficial femoral and above the knee popliteal artery secondary to bulky calcified atherosclerotic plaque. 3. Severe runoff disease with complete occlusion of all 3 runoff vessels. There is reconstitution of both the dorsalis pedis and posterior tibial arteries at the ankle. 4. Successful recanalization of occluded segment of the superficial femoral artery. 5. Angioplasty and stent placement in the above the knee popliteal artery, and superficial femoral artery using a combination of Supera and Viabahn stents.  PLAN: 1. Load with Plavix and admitted overnight for observation. 2. Anticipate discharge home in the morning. Patient will require Plavix for the next 3 months. 3. Follow-up in clinic in 2-4  weeks with repeat ankle brachial indices to establish a new baseline.  Signed,  Criselda Peaches, MD  [facility]  [facility]   Electronically Signed   By: Jacqulynn Cadet M.D.   On: 04/23/2015 19:00   Ir US Guide Vasc Access Right  04/23/2015   CLINICAL DATA:  69 year old male with rather for category 5 critical limb ischemia involving the right lower extremity. He has a slowly healing ulcer on the right great toe. 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 Additionally, he likely has significant runoff disease as well as more distal femoral popliteal disease. Presents today for endovascular revascularization.  EXAM: RIGHT EXTREMITY ARTERIOGRAPHY; IR ULTRASOUND GUIDANCE VASC ACCESS RIGHT; IR FEM POP ART STENT INC PTA  Date: 04/23/2015  PROCEDURE: 1. Ultrasound-guided puncture left common femoral artery 2. Pelvic arteriogram 3. Up in over catheterization of the right common femoral artery 4. Multi station right lower extremity arteriogram 5. Recanalization of occluded superficial femoral artery 6. Angioplasty and stenting of the popliteal and superficial femoral arteries Interventional Radiologist:  Criselda Peaches, MD  ANESTHESIA/SEDATION: Moderate (conscious) sedation was used. 6 mg Versed, 300 mcg Fentanyl were administered intravenously. The patient's vital signs were monitored continuously by radiology nursing throughout the procedure.  Sedation Time: 240 Minutes  MEDICATIONS: 9000 units heparin administered intravenously during the course of the procedure  FLUOROSCOPY TIME:  52 minutes  4206 mGy  CONTRAST:  16mL VISIPAQUE IODIXANOL 320 MG/ML IV SOLN, 163mL VISIPAQUE IODIXANOL 320 MG/ML IV SOLN  TECHNIQUE: Informed consent was obtained from the patient following explanation of the procedure, risks, benefits and alternatives. The  patient understands, agrees and consents  for the procedure. All questions were addressed. A time out was performed.  Maximal barrier sterile technique utilized including caps, mask, sterile gowns, sterile gloves, large sterile drape, hand hygiene, and Betadine skin prep.  The left femoral artery was interrogated with ultrasound and found to be patent. Using standard technique, the vessel was punctured with a 21 Gy inch micropuncture needle after obtaining local anesthesia with 1% lidocaine. Images obtained stored for the medical record. The transitional micro sheath was used to place a 0.035 inch wire into the abdominal aorta. The micro sheath was then exchanged for a working 5 [place] vascular sheath. An Omni flush catheter was advanced into the aortic bifurcation and initial distal aortography and pelvic arteriography with CO2 was performed. There is scattered atherosclerotic disease but no focal stenosis involving the aortic or iliac systems. The right superficial femoral artery is occluded beginning approximately 1 cm beyond the origin.  The sauce Omni flush catheter was used to go up in over the bifurcation and then exchanged for an angled catheter which was positioned in the common femoral artery. A multi station right lower extremity arteriogram was performed. The superficial femoral artery is occluded for approximately 30 cm be for reconstituted Ing in the mid thigh. Distal to this, there are multiple moderate to high-grade stenoses involving the distal superficial femoral artery and above the knee popliteal artery. The origin of the anterior tibial artery is patent but the vessel occludes proximally the tibioperoneal trunk is also occluded. A collateral vessels supplies flow to both the posterior tibial and dorsalis pedis arteries.  Using the angled catheter and Bentson wire, the catheter was advanced intraluminally through the superficial femoral artery occlusion and into the patent vessel in the mid thigh.  This was confirmed with a gentle hand injection of contrast material.  The 0.035 inch wire was then exchanged for a 0.014 wire. A Nav6 distal embolization protection device was then deployed above the knee. The above the knee popliteal artery and the superficial femoral artery was angioplastied to 5.5 mm using a 5.5 mm by 15 cm had numeral balloon. Angiography demonstrated restored patency through the SFA. However, there is still significant residual stenosis secondary to bulky calcified plaque as well as multiple small longitudinal dissections.  A 5.5 mm x 6 cm Supera stent was deployed in the region of the adductor canal at a site of residual significant stenosis secondary to calcified atherosclerotic plaque. The mid is super of the mid and proximal superficial femoral artery will also stented in the region of high-grade stenosis and occlusion. A combination of overlapping a 6 mm x 15 cm and 6 mm x 8 cm Supera stents were deployed. The origin of the superficial femoral artery was stented with a 7 mm x 5 cm via Bond stent. This was post dilated to 7 mm using the must Ing balloon. Final arteriography demonstrates in-line flow to the popliteal artery without evidence of residual stenosis. Flow is brisk. A final runoff lateral ankle demonstrates persistent patency of the solitary runoff vessel. No evidence of distal embolization.  Hemostasis was attained with the assistance of a Cordis ExoSeal device.  COMPLICATIONS: None  Estimated blood loss: 100 mL  IMPRESSION: 1. Approximately 30 cm occluded segments of the right superficial femoral artery beginning 1 cm beyond the origin and extending into the mid thigh. 2. Multifocal significant stenoses in the distal superficial femoral and above the knee popliteal artery secondary to bulky calcified atherosclerotic plaque. 3. Severe runoff disease with complete occlusion of all  3 runoff vessels. There is reconstitution of both the dorsalis pedis and posterior tibial arteries  at the ankle. 4. Successful recanalization of occluded segment of the superficial femoral artery. 5. Angioplasty and stent placement in the above the knee popliteal artery, and superficial femoral artery using a combination of Supera and Viabahn stents.  PLAN: 1. Load with Plavix and admitted overnight for observation. 2. Anticipate discharge home in the morning. Patient will require Plavix for the next 3 months. 3. Follow-up in clinic in 2-4 weeks with repeat ankle brachial indices to establish a new baseline.  Signed,  Criselda Peaches, MD  [facility]  [facility] Radiology   Electronically Signed   By: Jacqulynn Cadet M.D.   On: 04/23/2015 19:00    Labs:  CBC:  Recent Labs  04/23/15 1000  WBC 7.0  HGB 13.8  HCT 41.5  PLT 160    COAGS:  Recent Labs  04/23/15 1000  INR 1.07    BMP:  Recent Labs  03/18/15 0831 04/23/15 1000  NA  --  138  K  --  4.7  CL  --  103  CO2  --  26  GLUCOSE  --  277*  BUN 22 21*  CALCIUM  --  9.6  CREATININE 1.35* 1.35*  GFRNONAA 54* 53*  GFRAA 63 >60    LIVER FUNCTION TESTS: No results for input(s): BILITOT, AST, ALT, ALKPHOS, PROT, ALBUMIN in the last 8760 hours.  TUMOR MARKERS: No results for input(s): AFPTM, CEA, CA199, CHROMGRNA in the last 8760 hours.  Assessment and Plan:  Doing very well 3 weeks post endovascular reconstruction of the occluded right SFA.  His toe wound is healing.  ABIs are significantly improved and maintain improvement after exercise.   He continues to have known LLE PAD with claudication but no wounds or rest pain.  He would like to defer nay additional intervention at this time.   I will continue to follow him and if his disease progresses to a point where his quality of life is affected we will discuss further treatment.   1.) Return to clinic in 6 weeks with repeat ABIs and RLE arterial duplex.   2.) Continue wound care.  If healing plateaus we may have to  consider revascularizing the occluded anterior tibial artery.  Thank you for this interesting consult.  I greatly enjoyed meeting Dawn I Walkup and look forward to participating in their care.  A copy of this report was sent to the requesting provider on this date.  Signed: [place], [place] 05/20/2015, 11:22 AM   I spent a total of  15 Minutes in face to face in clinical consultation, greater than 50% of which was counseling/coordinating care for critical limb ischemia.

## 2015-05-20 NOTE — Therapy (Signed)
[place], [name], [address] Phone: [phone]   Fax:  [phone]  Wound Care Therapy (Re-Assessment)  Patient Details  Name: [name] MRN: [id number] Date of Birth: 01-Nov-1945 Referring Provider:  Sanjuana Kava, MD  Encounter Date: 05/20/2015      PT End of Session - 05/20/15 1004    Visit Number 12   Number of Visits 24   Date for PT Re-Evaluation 06/22/15   Authorization Type [employer] Medicare    Authorization Time Period 04/22/15 to 06/21/05   Authorization - Visit Number 12   Authorization - Number of Visits 20   PT Start Time 0918   PT Stop Time 0945   PT Time Calculation (min) 27 min   Activity Tolerance Patient tolerated treatment well   Behavior During Therapy [facility] for tasks assessed/performed      Past Medical History  Diagnosis Date  . Hypertension   . Diabetes mellitus     type 2   . History of valve replacement 2010    [facility] Ease bioprosthetic 65m  . Thyroid disease   . Stroke 2003    R-sided weakness & upper extremity swelling   . Coronary artery disease   . Dyslipidemia   . At risk for sudden cardiac death 110/25/14 . PAF (paroxysmal atrial fibrillation) 05/2013    converted with amiodarone to SR  . Chronic anticoagulation 05/2013    started  . S/P cardiac catheterization, Rt & Lt heart cath 06/05/2013 110-25-2014 . S/P CABG x 3 2010  . GERD (gastroesophageal reflux disease)   . Cataract   . Chronic kidney disease     Patient reports he is seeing Kidney doctor in September    Past Surgical History  Procedure Laterality Date  . Coronary artery bypass graft  01/26/2009    LIMA to LAD, SVG to circumflex, SVG to PDA  . Aortic valve replacement  01/26/2009    [facility]  . Transthoracic echocardiogram  09/2011    grade 1 diastolic dysfunction; increasing valve gradient; calcified MV annulus   . Cardiac catheterization  06/05/2013     native 3 vessel disease; patent LIMA to LAD, SVG to OM and SVG to PDA; Elevated right and left heart filling pressures, with predominantly pulmonary venous hypertension, normal PVR  . Transthoracic echocardiogram  06/03/2013    EF 25-30%, mod conc. hypertrophy, grade 3 diastolic dysfunction; LA mod dilated; calcified MV annulus; transaortic gradients are normal for the bioprosthetic valve; inf vena cava dilated (elevated CVP) - LifeVest  . Left and right heart catheterization with coronary angiogram N/A 06/05/2013    Procedure: LEFT AND RIGHT HEART CATHETERIZATION WITH CORONARY ANGIOGRAM;  Surgeon: DaLeonie ManMD;  [facility];  Service: Cardiovascular;  Laterality: N/A;  . Graft(s) angiogram  06/05/2013    Procedure: GRAFT(S) ANCyril Loosen Surgeon: DaLeonie ManMD;  [facility];  Service: Cardiovascular;;    There were no vitals filed for this visit.  Visit Diagnosis:  Open toe wound, subsequent encounter  Unsteadiness  Difficulty walking      Subjective Assessment - 05/20/15 0956    Subjective Pt stated he went to MD yesterday and reports happy with progress.  No current pain   Currently in Pain? No/denies            Wound Therapy - 05/20/15 0957    Subjective Pt stated he went to MD yesterday and  reports happy with progress.  No current pain   Patient and Family Stated Goals wound to heal    Pain Assessment No/denies pain   Wound Properties Date First Assessed: 04/22/15 Time First Assessed: 1020 Wound Type: Other (Comment) , open wound   Location: Toe (Comment  which one) , R great toe   Location Orientation: Right Present on Admission: Yes   Dressing Type Silver hydrofiber;Gauze (Comment)  Vaseline perimeter, silverhydrofiber, 2x2 and gauze   Dressing Status Old drainage   Dressing Change Frequency Monday, Wednesday, Friday   Site / Wound Assessment Granulation tissue;Yellow   % Wound base Red or Granulating 75%   % Wound base Yellow 25%   %  Wound base Black 0%   Peri-wound Assessment Edema   Wound Length (cm) 2.3 cm   Wound Width (cm) 1 cm   Wound Depth (cm) 0 cm   Margins Epibole (rolled edges)   Closure None   Drainage Amount Minimal   Drainage Description Serosanguineous   Treatment Cleansed;Debridement (Selective)   Incision Properties Date First Assessed: 04/23/15 Time First Assessed: 1738 Location: Groin Location Orientation: Left Present on Admission: No   Selective Debridement - Location R great toe    Selective Debridement - Tools Used Forceps;Scissors   Selective Debridement - Tissue Removed slough, callous    Wound Therapy - Clinical Statement Granulation continues to approximate with healthy skin tissue surrounding wound.  No noted depth this session.  No reports of pain through session or signs of infection.  Continued with silverhydrofiber with vaselive perimeter or wound and gauze with netting.   Wound Therapy - Functional Problem List non-healing wound complicated with DM and vascular compromise.    Factors Delaying/Impairing Wound Healing Altered sensation;Diabetes Mellitus;Infection - systemic/local;Multiple medical problems;Vascular compromise   Wound Therapy - Frequency 3X / week   Wound Plan Recommend continuing 2x week for 4 more weeks.  Pt with plans to be out of town at beginning of next week so keep Friday's apt then reduce to 2x a week upon return from vacation.     Dressing  moistened silverhydrofiber, vaseline to perimeter followed by 2x2 and 2" gauze to toe only.    Decrease Necrotic Tissue to 20%   Decrease Necrotic Tissue - Progress Met   Increase Granulation Tissue to 80%   Increase Granulation Tissue - Progress Met   Decrease Length/Width/Depth by (cm) 1.0   Decrease Length/Width/Depth - Progress Progressing toward goal   Patient/Family will be able to  independently dress and inspect wound for signs of infection   Patient/Family Instruction Goal - Progress Met   Additional Wound Therapy Goal  Full wound healing    Additional Wound Therapy Goal - Progress Progressing toward goal          Problem List Patient Active Problem List   Diagnosis Date Noted  . S/P angioplasty 04/23/2015  . Peripheral arterial occlusive disease ([place]) 04/23/2015  . PAD (peripheral artery disease) ([place])   . Critical lower limb ischemia   . Toe ulcer, right ([place])   . Type II or unspecified type diabetes mellitus without mention of complication, uncontrolled 11/27/2013  . Acute respiratory failure with hypoxia ([place]) 11/26/2013  . CAP (community acquired pneumonia) 11/26/2013  . CHF exacerbation ([place]) 11/25/2013  . Cardiomyopathy, ischemic 06/13/2013  . At risk for sudden cardiac death 06-17-13  . S/P cardiac catheterization, Rt & Lt heart cath 06/05/2013 06/17/2013  . Acute on chronic combined systolic and diastolic HF (heart failure), NYHA class 3 ([place])  06/04/2013  . Atrial flutter with rapid ventricular response ([place]) 06/02/2013  . Acute renal failure: Cr up to 1.8 06/02/2013  . Hyperkalemia 06/02/2013    Class: Acute  . LBBB (left bundle branch block) 06/01/2013  . Hypotension- secondary to AF and Diltiazem Rx 06/01/2013  . Obesity (BMI 30-39.9) 06/01/2013  . Acute on chronic diastolic heart failure - due to Afib AVR 06/01/2013  . Atrial fibrillation with RVR- converted with Amiodarone 05/31/2013  . Stroke- Lt brain 2003 04/08/2013  . Hemiplegia affecting right dominant side ([place]) 04/08/2013  . Cataract 04/08/2013  . GERD (gastroesophageal reflux disease) 04/08/2013  . S/P CABG x 3 - 2010 04/08/2013  . S/P AVR-tissue, 2010 04/08/2013  . DM2 (diabetes mellitus, type 2) ([place]) 04/08/2013  . Dyslipidemia 04/08/2013  . SUBACROMIAL BURSITIS, LEFT 11/05/2009   Ihor Austin, LPTA; CBIS [phone]  Aldona Lento 05/20/2015, 10:11 AM   Physical Therapy Progress Note  Dates of Reporting Period: 04/22/15 to 05/20/15  Objective Reports of Subjective Statement: see above    Objective Measurements: see above   Goal Update: see above   Plan: see above   Reason Skilled Services are Required: continue to facilitate optimal wound healing process and to prevent infection   Deniece Ree PT, DPT [place], [name], [address] Phone: [phone]   Fax:  [phone]

## 2015-05-22 ENCOUNTER — Ambulatory Visit (HOSPITAL_COMMUNITY): Payer: Medicare Other

## 2015-05-22 DIAGNOSIS — S91109D Unspecified open wound of unspecified toe(s) without damage to nail, subsequent encounter: Secondary | ICD-10-CM

## 2015-05-22 DIAGNOSIS — I739 Peripheral vascular disease, unspecified: Secondary | ICD-10-CM | POA: Insufficient documentation

## 2015-05-22 DIAGNOSIS — R2681 Unsteadiness on feet: Secondary | ICD-10-CM | POA: Diagnosis not present

## 2015-05-22 DIAGNOSIS — R262 Difficulty in walking, not elsewhere classified: Secondary | ICD-10-CM | POA: Diagnosis not present

## 2015-05-22 NOTE — Therapy (Signed)
[place], [name], [address] Phone: [phone]   Fax:  [phone]  Wound Care Therapy  Patient Details  Name: [name] MRN: [id number] Date of Birth: 07/18/1946 Referring Provider:  Sanjuana Kava, MD  Encounter Date: 05/22/2015      PT End of Session - 05/22/15 1015    Visit Number 13   Number of Visits 24   Date for PT Re-Evaluation 06/22/15   Authorization Type UHC Medicare    Authorization Time Period 04/22/15 to 06/21/05   Authorization - Visit Number 13   Authorization - Number of Visits 20   PT Start Time 0936   PT Stop Time 1005   PT Time Calculation (min) 29 min   Activity Tolerance Patient tolerated treatment well   Behavior During Therapy [facility] for tasks assessed/performed      Past Medical History  Diagnosis Date  . Hypertension   . Diabetes mellitus     type 2   . History of valve replacement 2010    [facility] Ease bioprosthetic 56mm  . Thyroid disease   . Stroke 2003    R-sided weakness & upper extremity swelling   . Coronary artery disease   . Dyslipidemia   . At risk for sudden cardiac death Jun 21, 2013  . PAF (paroxysmal atrial fibrillation) 05/2013    converted with amiodarone to SR  . Chronic anticoagulation 05/2013    started  . S/P cardiac catheterization, Rt & Lt heart cath 06/05/2013 06-21-2013  . S/P CABG x 3 2010  . GERD (gastroesophageal reflux disease)   . Cataract   . Chronic kidney disease     Patient reports he is seeing Kidney doctor in September    Past Surgical History  Procedure Laterality Date  . Coronary artery bypass graft  01/26/2009    LIMA to LAD, SVG to circumflex, SVG to PDA  . Aortic valve replacement  01/26/2009    [facility] Ease bioprosthetic 13mm valve  . Transthoracic echocardiogram  09/2011    grade 1 diastolic dysfunction; increasing valve gradient; calcified MV annulus   . Cardiac catheterization  06/05/2013    native 3  vessel disease; patent LIMA to LAD, SVG to OM and SVG to PDA; Elevated right and left heart filling pressures, with predominantly pulmonary venous hypertension, normal PVR  . Transthoracic echocardiogram  06/03/2013    EF 25-30%, mod conc. hypertrophy, grade 3 diastolic dysfunction; LA mod dilated; calcified MV annulus; transaortic gradients are normal for the bioprosthetic valve; inf vena cava dilated (elevated CVP) - LifeVest  . Left and right heart catheterization with coronary angiogram N/A 06/05/2013    Procedure: LEFT AND RIGHT HEART CATHETERIZATION WITH CORONARY ANGIOGRAM;  Surgeon: Leonie Man, MD;  Location: [facility] C[facility] LAB;  Service: Cardiovascular;  Laterality: N/A;  . Graft(s) angiogram  06/05/2013    Procedure: GRAFT(S) Cyril Loosen;  Surgeon: Leonie Man, MD;  Location: [facility] CATH LAB;  Service: Cardiovascular;;    There were no vitals filed for this visit.  Visit Diagnosis:  Open toe wound, subsequent encounter  Unsteadiness  Difficulty walking      Subjective Assessment - 05/22/15 1009    Subjective Pain free today.  Reports blood sugar at 432 this morning, had cornbread last night an believes that raised blood sugar.   Currently in Pain? No/denies                   Wound Therapy - 05/22/15 1010  Subjective Pain free today.  Reports blood sugar at 432 this morning, had cornbread last night an believes that raised blood sugar.   Patient and Family Stated Goals wound to heal    Pain Assessment No/denies pain   Wound Properties Date First Assessed: 04/22/15 Time First Assessed: 1020 Wound Type: Other (Comment) , open wound   Location: Toe (Comment  which one) , R great toe   Location Orientation: Right Present on Admission: Yes   Dressing Type Silver hydrofiber;Gauze (Comment)  vaseline perimeter, silver hydrofiber, 2x2 and gauze   Dressing Status Old drainage   Dressing Change Frequency Other (Comment)  2x a week   Site / Wound Assessment Granulation  tissue;Yellow   % Wound base Red or Granulating 75%   % Wound base Yellow 25%   % Wound base Black 0%   Peri-wound Assessment Edema   Margins Epibole (rolled edges)   Closure None   Drainage Amount Minimal   Drainage Description Serosanguineous   Treatment Cleansed;Debridement (Selective)   Incision Properties Date First Assessed: 04/23/15 Time First Assessed: 1738 Location: Groin Location Orientation: Left Present on Admission: No   Selective Debridement - Location R great toe    Selective Debridement - Tools Used Forceps;Scalpel   Selective Debridement - Tissue Removed slough, callous    Wound Therapy - Clinical Statement Noted increased redness wound bed.  Granulation continues to approximate with healthy skin surrounding wound bed.  No signs of infection noted.  Reduced frequency to 2x a week following reassessment last session.     Wound Therapy - Functional Problem List non-healing wound complicated with DM and vascular compromise.    Factors Delaying/Impairing Wound Healing Altered sensation;Diabetes Mellitus;Infection - systemic/local;Multiple medical problems;Vascular compromise   Wound Therapy - Frequency Other (comment)  2x/week   Wound Plan Contnue with current PT POC with approximate dressings.   Dressing  moistened silverhydrofiber, vaseline to perimeter followed by 2x2 and 2" gauze to toe only.           Problem List Patient Active Problem List   Diagnosis Date Noted  . S/P angioplasty 04/23/2015  . Peripheral arterial occlusive disease ([place]) 04/23/2015  . PAD (peripheral artery disease) ([place])   . Critical lower limb ischemia   . Toe ulcer, right ([place])   . Type II or unspecified type diabetes mellitus without mention of complication, uncontrolled 11/27/2013  . Acute respiratory failure with hypoxia ([place]) 11/26/2013  . CAP (community acquired pneumonia) 11/26/2013  . CHF exacerbation ([place]) 11/25/2013  . Cardiomyopathy, ischemic 06/13/2013  . At risk for sudden  cardiac death Jun 25, 2013  . S/P cardiac catheterization, Rt & Lt heart cath 06/05/2013 06-25-2013  . Acute on chronic combined systolic and diastolic HF (heart failure), NYHA class 3 ([place]) 06/04/2013  . Atrial flutter with rapid ventricular response ([place]) 06/02/2013  . Acute renal failure: Cr up to 1.8 06/02/2013  . Hyperkalemia 06/02/2013    Class: Acute  . LBBB (left bundle branch block) 06/01/2013  . Hypotension- secondary to AF and Diltiazem Rx 06/01/2013  . Obesity (BMI 30-39.9) 06/01/2013  . Acute on chronic diastolic heart failure - due to Afib AVR 06/01/2013  . Atrial fibrillation with RVR- converted with Amiodarone 05/31/2013  . Stroke- Lt brain 2003 04/08/2013  . Hemiplegia affecting right dominant side ([place]) 04/08/2013  . Cataract 04/08/2013  . GERD (gastroesophageal reflux disease) 04/08/2013  . S/P CABG x 3 - 2010 04/08/2013  . S/P AVR-tissue, 2010 04/08/2013  . DM2 (diabetes mellitus, type 2) ([place]) 04/08/2013  .  Dyslipidemia 04/08/2013  . SUBACROMIAL BURSITIS, LEFT 11/05/2009   Ihor Austin, LPTA; CBIS [phone]  Aldona Lento 05/22/2015, 10:16 AM  [place], [name], [address] Phone: [phone]   Fax:  [phone]

## 2015-05-25 ENCOUNTER — Other Ambulatory Visit (HOSPITAL_COMMUNITY): Payer: Self-pay | Admitting: Interventional Radiology

## 2015-05-25 ENCOUNTER — Ambulatory Visit (HOSPITAL_COMMUNITY): Payer: Medicare Other | Admitting: Physical Therapy

## 2015-05-25 DIAGNOSIS — I739 Peripheral vascular disease, unspecified: Secondary | ICD-10-CM

## 2015-05-28 ENCOUNTER — Telehealth (HOSPITAL_COMMUNITY): Payer: Self-pay

## 2015-05-28 ENCOUNTER — Ambulatory Visit (HOSPITAL_COMMUNITY): Payer: Medicare Other

## 2015-05-28 NOTE — Telephone Encounter (Signed)
No show, called and spoke to pt. who is still out of town.  Apt made for Friday (tomorrow)  [name], [place]; CBIS [phone]

## 2015-05-29 ENCOUNTER — Ambulatory Visit (HOSPITAL_COMMUNITY): Payer: Medicare Other

## 2015-05-29 DIAGNOSIS — R262 Difficulty in walking, not elsewhere classified: Secondary | ICD-10-CM

## 2015-05-29 DIAGNOSIS — S91109D Unspecified open wound of unspecified toe(s) without damage to nail, subsequent encounter: Secondary | ICD-10-CM | POA: Diagnosis not present

## 2015-05-29 DIAGNOSIS — R2681 Unsteadiness on feet: Secondary | ICD-10-CM

## 2015-05-29 NOTE — Therapy (Signed)
[place], [name], [address] Phone: [phone]   Fax:  [phone]  Wound Care Therapy  Patient Details  Name: [name] MRN: [id number] Date of Birth: 1946-03-22 No Data Recorded  Encounter Date: 05/29/2015      PT End of Session - 05/29/15 0956    Visit Number 14   Number of Visits 24   Date for PT Re-Evaluation 06/22/15   Authorization Type UHC Medicare    Authorization Time Period 04/22/15 to 06/21/05   Authorization - Visit Number 14   Authorization - Number of Visits 20   PT Start Time 6712   PT Stop Time 0933   PT Time Calculation (min) 38 min   Activity Tolerance Patient tolerated treatment well   Behavior During Therapy [facility] for tasks assessed/performed      Past Medical History  Diagnosis Date  . Hypertension   . Diabetes mellitus     type 2   . History of valve replacement 2010    [facility] Ease bioprosthetic 89mm  . Thyroid disease   . Stroke 2003    R-sided weakness & upper extremity swelling   . Coronary artery disease   . Dyslipidemia   . At risk for sudden cardiac death June 08, 2013  . PAF (paroxysmal atrial fibrillation) 05/2013    converted with amiodarone to SR  . Chronic anticoagulation 05/2013    started  . S/P cardiac catheterization, Rt & Lt heart cath 06/05/2013 06/08/2013  . S/P CABG x 3 2010  . GERD (gastroesophageal reflux disease)   . Cataract   . Chronic kidney disease     Patient reports he is seeing Kidney doctor in September    Past Surgical History  Procedure Laterality Date  . Coronary artery bypass graft  01/26/2009    LIMA to LAD, SVG to circumflex, SVG to PDA  . Aortic valve replacement  01/26/2009    [facility] Ease bioprosthetic 61mm valve  . Transthoracic echocardiogram  09/2011    grade 1 diastolic dysfunction; increasing valve gradient; calcified MV annulus   . Cardiac catheterization  06/05/2013    native 3 vessel disease; patent  LIMA to LAD, SVG to OM and SVG to PDA; Elevated right and left heart filling pressures, with predominantly pulmonary venous hypertension, normal PVR  . Transthoracic echocardiogram  06/03/2013    EF 25-30%, mod conc. hypertrophy, grade 3 diastolic dysfunction; LA mod dilated; calcified MV annulus; transaortic gradients are normal for the bioprosthetic valve; inf vena cava dilated (elevated CVP) - LifeVest  . Left and right heart catheterization with coronary angiogram N/A 06/05/2013    Procedure: LEFT AND RIGHT HEART CATHETERIZATION WITH CORONARY ANGIOGRAM;  Surgeon: Leonie Man, MD;  Location: [facility] CATH LAB;  Service: Cardiovascular;  Laterality: N/A;  . Graft(s) angiogram  06/05/2013    Procedure: GRAFT(S) Cyril Loosen;  Surgeon: Leonie Man, MD;  Location: [facility] CATH LAB;  Service: Cardiovascular;;    There were no vitals filed for this visit.  Visit Diagnosis:  Open toe wound, subsequent encounter  Unsteadiness  Difficulty walking      Subjective Assessment - 05/29/15 0952    Subjective Pain free today, reports increased feelings to toe with weight bearing.  Had good time on vacation to [place], back yesterrday   Currently in Pain? No/denies            Wound Therapy - 05/29/15 0952    Subjective Pain free today, reports increased feelings to  toe with weight bearing.  Had good time on vacation to [place], back yesterrday   Patient and Family Stated Goals wound to heal    Pain Assessment No/denies pain   Wound Properties Date First Assessed: 04/22/15 Time First Assessed: 1020 Wound Type: Other (Comment) , open wound   Location: Toe (Comment  which one) , R great toe   Location Orientation: Right Present on Admission: Yes   Dressing Type Silver hydrofiber;Gauze (Comment)  vaseline perimeter, silver hydrofiber, 2x2 4x4, gauze, netti   Dressing Status Old drainage   Dressing Change Frequency Other (Comment)  2x week   Site / Wound Assessment Granulation tissue;Yellow   % Wound base Red  or Granulating 75%   % Wound base Yellow 25%   % Wound base Black 0%   Peri-wound Assessment Edema   Margins Epibole (rolled edges)   Closure None   Drainage Amount Minimal   Drainage Description Serosanguineous   Treatment Cleansed;Debridement (Selective)   Incision Properties Date First Assessed: 04/23/15 Time First Assessed: 1738 Location: Groin Location Orientation: Left Present on Admission: No   Selective Debridement - Location R great toe    Selective Debridement - Tools Used Forceps;Scalpel   Selective Debridement - Tissue Removed slough, callous    Wound Therapy - Clinical Statement Selective debridement for removal of slough, dry skin perimeter and removal of macerated tissues proximal toe nail.  Continued with silver hydrofiber and gauze with netting dressings to promote healing.  Increased redness to wound bed with healthy skin surrounding, no signs of infection currently.  No reports of pain through session.   Wound Therapy - Functional Problem List non-healing wound complicated with DM and vascular compromise.    Factors Delaying/Impairing Wound Healing Altered sensation;Diabetes Mellitus;Infection - systemic/local;Multiple medical problems;Vascular compromise   Wound Therapy - Frequency Other (comment)  2x/ week   Wound Therapy - Current Recommendations Diabetic teaching;PT   Wound Plan Contnue with current PT POC with approximate dressings.   Dressing  moistened silverhydrofiber, vaseline to perimeter followed by 2x2 and 2" gauze to toe only.        Problem List Patient Active Problem List   Diagnosis Date Noted  . PVD (peripheral vascular disease) ([place])   . S/P angioplasty 04/23/2015  . Peripheral arterial occlusive disease ([place]) 04/23/2015  . PAD (peripheral artery disease) ([place])   . Critical lower limb ischemia   . Toe ulcer, right ([place])   . Type II or unspecified type diabetes mellitus without mention of complication, uncontrolled 11/27/2013  . Acute respiratory  failure with hypoxia ([place]) 11/26/2013  . CAP (community acquired pneumonia) 11/26/2013  . CHF exacerbation ([place]) 11/25/2013  . Cardiomyopathy, ischemic 06/13/2013  . At risk for sudden cardiac death 06/24/13  . S/P cardiac catheterization, Rt & Lt heart cath 06/05/2013 24-Jun-2013  . Acute on chronic combined systolic and diastolic HF (heart failure), NYHA class 3 ([place]) 06/04/2013  . Atrial flutter with rapid ventricular response ([place]) 06/02/2013  . Acute renal failure: Cr up to 1.8 06/02/2013  . Hyperkalemia 06/02/2013    Class: Acute  . LBBB (left bundle branch block) 06/01/2013  . Hypotension- secondary to AF and Diltiazem Rx 06/01/2013  . Obesity (BMI 30-39.9) 06/01/2013  . Acute on chronic diastolic heart failure - due to Afib AVR 06/01/2013  . Atrial fibrillation with RVR- converted with Amiodarone 05/31/2013  . Stroke- Lt brain 2003 04/08/2013  . Hemiplegia affecting right dominant side ([place]) 04/08/2013  . Cataract 04/08/2013  . GERD (gastroesophageal reflux disease) 04/08/2013  .  S/P CABG x 3 - 2010 04/08/2013  . S/P AVR-tissue, 2010 04/08/2013  . DM2 (diabetes mellitus, type 2) ([place]) 04/08/2013  . Dyslipidemia 04/08/2013  . SUBACROMIAL BURSITIS, LEFT 11/05/2009   Ihor Austin, LPTA; CBIS [phone]  Aldona Lento 05/29/2015, 9:57 AM  [place], [name], [address] Phone: [phone]   Fax:  [phone]  Name: [name] MRN: [id number] Date of Birth: 02-Nov-1945

## 2015-06-02 ENCOUNTER — Ambulatory Visit (HOSPITAL_COMMUNITY): Payer: Medicare Other | Admitting: Physical Therapy

## 2015-06-02 DIAGNOSIS — R2681 Unsteadiness on feet: Secondary | ICD-10-CM

## 2015-06-02 DIAGNOSIS — S91109D Unspecified open wound of unspecified toe(s) without damage to nail, subsequent encounter: Secondary | ICD-10-CM

## 2015-06-02 DIAGNOSIS — R262 Difficulty in walking, not elsewhere classified: Secondary | ICD-10-CM | POA: Diagnosis not present

## 2015-06-02 NOTE — Therapy (Signed)
[place], [name], [address] Phone: [phone]   Fax:  [phone]  Wound Care Therapy  Patient Details  Name: [name] MRN: [id number] Date of Birth: [birth year] No Data Recorded  Encounter Date: 06/02/2015      PT End of Session - 06/02/15 0951    Visit Number 15   Number of Visits 24   Authorization Type UHC Medicare    Authorization Time Period 04/22/15 to 06/21/05   Authorization - Visit Number 15   Authorization - Number of Visits 20   PT Start Time 0910   PT Stop Time 0943   PT Time Calculation (min) 33 min   Activity Tolerance Patient tolerated treatment well      Past Medical History  Diagnosis Date  . Hypertension   . Diabetes mellitus     type 2   . History of valve replacement 2010    [facility] Ease bioprosthetic 71mm  . Thyroid disease   . Stroke 2003    R-sided weakness & upper extremity swelling   . Coronary artery disease   . Dyslipidemia   . At risk for sudden cardiac death June 26, 2013  . PAF (paroxysmal atrial fibrillation) 05/2013    converted with amiodarone to SR  . Chronic anticoagulation 05/2013    started  . S/P cardiac catheterization, Rt & Lt heart cath 06/05/2013 06/26/2013  . S/P CABG x 3 2010  . GERD (gastroesophageal reflux disease)   . Cataract   . Chronic kidney disease     Patient reports he is seeing Kidney doctor in September    Past Surgical History  Procedure Laterality Date  . Coronary artery bypass graft  01/26/2009    LIMA to LAD, SVG to circumflex, SVG to PDA  . Aortic valve replacement  01/26/2009    [facility] Ease bioprosthetic 73mm valve  . Transthoracic echocardiogram  09/2011    grade 1 diastolic dysfunction; increasing valve gradient; calcified MV annulus   . Cardiac catheterization  06/05/2013    native 3 vessel disease; patent LIMA to LAD, SVG to OM and SVG to PDA; Elevated right and left heart filling pressures, with  predominantly pulmonary venous hypertension, normal PVR  . Transthoracic echocardiogram  06/03/2013    EF 25-30%, mod conc. hypertrophy, grade 3 diastolic dysfunction; LA mod dilated; calcified MV annulus; transaortic gradients are normal for the bioprosthetic valve; inf vena cava dilated (elevated CVP) - LifeVest  . Left and right heart catheterization with coronary angiogram N/A 06/05/2013    Procedure: LEFT AND RIGHT HEART CATHETERIZATION WITH CORONARY ANGIOGRAM;  Surgeon: Leonie Man, MD;  Location[facility] CATH LAB;  Service: Cardiovascular;  Laterality: N/A;  . Graft(s) angiogram  06/05/2013    Procedure: GRAFT(S) Cyril Loosen;  Surgeon: Leonie Man, MD;  Location: [facility] CATH LAB;  Service: Cardiovascular;;    There were no vitals filed for this visit.  Visit Diagnosis:  Open toe wound, subsequent encounter  Unsteadiness  Difficulty walking           Wound Therapy - 06/02/15 0943    Subjective Pt states he is not having any pain in his toe    Patient and Family Stated Goals wound to heal    Pain Assessment No/denies pain   Wound Properties Date First Assessed: 04/22/15 Time First Assessed: 1020 Wound Type: Other (Comment) , open wound   Location: Toe (Comment  which one) , R great toe   Location Orientation: Right  Present on Admission: Yes   Dressing Type Silver hydrofiber;Gauze (Comment)  vaseline perimeter, silver hydrofiber, 2x2 gauze and toecap   Dressing Status Old drainage   Dressing Change Frequency Other (Comment)  2x week   Site / Wound Assessment Granulation tissue;Yellow   % Wound base Red or Granulating 80%   % Wound base Yellow 20%   % Wound base Black 0%   Peri-wound Assessment Edema   Wound Length (cm) 0.7 cm   Wound Width (cm) 2.5 cm   Wound Depth (cm) 0.4 cm  center pinpoint area that use to tunnel 1.5 cm    Tunneling (cm) continuse to undermines but decreased to .3    Margins Epibole (rolled edges)   Closure None   Drainage Amount Minimal   Drainage  Description Serosanguineous   Treatment Cleansed;Debridement (Selective)   Incision Properties Date First Assessed: 04/23/15 Time First Assessed: 1738 Location: Groin Location Orientation: Left Present on Admission: No   Selective Debridement - Location R great toe    Selective Debridement - Tools Used Forceps;Scissors   Selective Debridement - Tissue Removed slough, callous, epibole edges    Wound Therapy - Clinical Statement Pt wound continues to decrease in undermining as well as tunneling.  Toe appeared red when bandages were first taken off but therapist feels this was secondary to bandatge being tight.  Throughout treatment toe redness decreased.  Wrapped the toe lightly with 2" gauze followed by application toe cap.    Wound Therapy - Functional Problem List non-healing wound complicated with DM and vascular compromise.    Factors Delaying/Impairing Wound Healing Altered sensation;Diabetes Mellitus;Infection - systemic/local;Multiple medical problems;Vascular compromise   Wound Therapy - Frequency Other (comment)  2x/ week   Wound Therapy - Current Recommendations Diabetic teaching;PT   Wound Plan Contnue with current PT POC with approximate dressings.   Dressing  moistened silverhydrofiber, vaseline to perimeter followed by 2x2 and 2" gauze to toe only.             Problem List Patient Active Problem List   Diagnosis Date Noted  . PVD (peripheral vascular disease) ([place])   . S/P angioplasty 04/23/2015  . Peripheral arterial occlusive disease ([place]) 04/23/2015  . PAD (peripheral artery disease) ([place])   . Critical lower limb ischemia   . Toe ulcer, right ([place])   . Type II or unspecified type diabetes mellitus without mention of complication, uncontrolled 11/27/2013  . Acute respiratory failure with hypoxia ([place]) 11/26/2013  . CAP (community acquired pneumonia) 11/26/2013  . CHF exacerbation ([place]) 11/25/2013  . Cardiomyopathy, ischemic 06/13/2013  . At risk for sudden cardiac  death 06-17-13  . S/P cardiac catheterization, Rt & Lt heart cath 06/05/2013 06/17/2013  . Acute on chronic combined systolic and diastolic HF (heart failure), NYHA class 3 ([place]) 06/04/2013  . Atrial flutter with rapid ventricular response ([place]) 06/02/2013  . Acute renal failure: Cr up to 1.8 06/02/2013  . Hyperkalemia 06/02/2013    Class: Acute  . LBBB (left bundle branch block) 06/01/2013  . Hypotension- secondary to AF and Diltiazem Rx 06/01/2013  . Obesity (BMI 30-39.9) 06/01/2013  . Acute on chronic diastolic heart failure - due to Afib AVR 06/01/2013  . Atrial fibrillation with RVR- converted with Amiodarone 05/31/2013  . Stroke- Lt brain 2003 04/08/2013  . Hemiplegia affecting right dominant side ([place]) 04/08/2013  . Cataract 04/08/2013  . GERD (gastroesophageal reflux disease) 04/08/2013  . S/P CABG x 3 - 2010 04/08/2013  . S/P AVR-tissue, 2010 04/08/2013  .  DM2 (diabetes mellitus, type 2) ([place]) 04/08/2013  . Dyslipidemia 04/08/2013  . SUBACROMIAL BURSITIS, LEFT 11/05/2009    Rayetta Humphrey, PT CLT [phone] 06/02/2015, 9:52 AM  [place] [address] [place], [name], [address] Phone: [phone]   Fax:  [phone]  Name: [name] MRN: [id number] Date of Birth: 06-18-1946

## 2015-06-03 ENCOUNTER — Other Ambulatory Visit: Payer: Self-pay | Admitting: *Deleted

## 2015-06-03 ENCOUNTER — Telehealth: Payer: Self-pay | Admitting: Internal Medicine

## 2015-06-03 VITALS — BP 130/72 | HR 65 | Resp 20 | Wt 250.0 lb

## 2015-06-03 DIAGNOSIS — Z599 Problem related to housing and economic circumstances, unspecified: Secondary | ICD-10-CM

## 2015-06-03 DIAGNOSIS — Z598 Other problems related to housing and economic circumstances: Secondary | ICD-10-CM

## 2015-06-03 DIAGNOSIS — Z748 Other problems related to care provider dependency: Secondary | ICD-10-CM

## 2015-06-03 NOTE — Patient Outreach (Signed)
[facility]) Care Management  06/03/2015  [name] 1946/08/02 [id number]   Request from Burgess Amor, RN to assign SW, assigned Rickey Nan, LCSW.  Thanks, [name]. [place], [place] Assistant Phone: [phone] Fax: [phone]

## 2015-06-03 NOTE — Patient Outreach (Addendum)
[name]) Care Management   06/03/2015  [name] 03/23/1946 [id number]  [name] is an 69 y.o. male  Subjective:  "I am doing fair" Patient reports he has not been following his diabetic diet, eating extra portions of bread. Patient reports he needs transportation to appointments in [place] appointments, states he can drive to [place] appointments Patient may move to [place] to apartment but does not want his local family to know. Patient continues to go to rehab for wound care twice a week, reports it is healing. Patient reports some bleeding from hemorrhoids, he is taking plavix and eliquis  Objective:   BP 130/72 mmHg  Pulse 65  Resp 20  Wt 250 lb (113.399 kg)  SpO2 96% Review of Systems  Constitutional: Negative.   Eyes: Negative.   Respiratory: Negative.   Cardiovascular: Negative.   Gastrointestinal: Positive for blood in stool.       Reports bleeding when wiping-states he has hemorrhoids  Musculoskeletal: Positive for falls.       [place]ed his left upper arm  Skin:       Wound right great toes, dressing in place    Physical Exam  Constitutional: He is oriented to person, place, and time. He appears well-developed and well-nourished.  Cardiovascular: Normal rate and regular rhythm.   Respiratory: Effort normal.  GI: Soft.  Neurological: He is alert and oriented to person, place, and time.  Skin: Skin is warm.    Current Medications:   Current Outpatient Prescriptions  Medication Sig Dispense Refill  . acetaminophen (TYLENOL) 500 MG tablet Take 1,000 mg by mouth every 6 (six) hours as needed. For pain/headaches     . ALPRAZolam (XANAX) 1 MG tablet Take 1 mg by mouth 4 (four) times daily as needed for anxiety.     Marland Kitchen amiodarone (PACERONE) 200 MG tablet Take 1 tablet (200 mg total) by mouth daily. 90 tablet 1  . apixaban (ELIQUIS) 5 MG TABS tablet Take 1 tablet (5 mg total) by  mouth 2 (two) times daily. 180 tablet 1  . atorvastatin (LIPITOR) 20 MG tablet Take 1 tablet (20 mg total) by mouth daily at 6 PM. 90 tablet 1  . bismuth subsalicylate (PEPTO BISMOL) 262 MG/15ML suspension Take 30 mLs by mouth every 6 (six) hours as needed for indigestion.    . celecoxib (CELEBREX) 200 MG capsule Take 1 capsule by mouth 2 (two) times daily.    . clopidogrel (PLAVIX) 75 MG tablet Take 1 tablet (75 mg total) by mouth daily. 30 tablet 3  . docusate sodium (COLACE) 100 MG capsule Take 100 mg by mouth 2 (two) times daily.      . fenofibrate (TRICOR) 145 MG tablet Take 1 tablet (145 mg total) by mouth daily. 90 tablet 1  . ferrous sulfate 325 (65 FE) MG EC tablet Take 325 mg by mouth every morning.    . gabapentin (NEURONTIN) 300 MG capsule Take 300 mg by mouth 2 (two) times daily.     Marland Kitchen HYDROcodone-acetaminophen (NORCO) 10-325 MG per tablet Take 1 tablet by mouth as needed for severe pain.     Marland Kitchen insulin glargine (LANTUS) 100 UNIT/ML injection Inject 60-70 Units into the skin 2 (two) times daily. Takes70 units in the morning and 60 units in the evening    . insulin lispro (HUMALOG) 100 UNIT/ML injection Inject 50-60 Units into the skin 3 (three) times daily before meals. 50U BID and 60U QHS    .  levothyroxine (SYNTHROID, LEVOTHROID) 25 MCG tablet Take 25 mcg by mouth daily.      . metoprolol succinate (TOPROL-XL) 25 MG 24 hr tablet Take 1 tablet (25 mg total) by mouth daily. 90 tablet 1  . Omega-3 Fatty Acids (FISH OIL PO) Take 1 capsule by mouth 2 (two) times daily.     . pantoprazole (PROTONIX) 40 MG tablet Take 1 tablet (40 mg total) by mouth 2 (two) times daily. 180 tablet 1  . silver sulfADIAZINE (SILVADENE) 1 % cream Apply 1 application topically 4 (four) times daily.    Marland Kitchen torsemide (DEMADEX) 10 MG tablet Take 1 tablet (10 mg total) by mouth daily. NEED OV. 30 tablet 0  . doxycycline (VIBRA-TABS) 100 MG tablet Take 100 mg by mouth 2 (two) times daily.     No current  facility-administered medications for this visit.    Functional Status:   In your present state of health, do you have any difficulty performing the following activities: 06/03/2015 04/23/2015  Hearing? N N  Vision? N N  Difficulty concentrating or making decisions? Y N  Walking or climbing stairs? N Y  Dressing or bathing? N N  Doing errands, shopping? N -  Preparing Food and eating ? N -  Using the Toilet? N -  In the past six months, have you accidently leaked urine? N -  Do you have problems with loss of bowel control? N -  Managing your Medications? N -  Managing your Finances? N -  Housekeeping or managing your Housekeeping? Y -    Fall/Depression Screening:    Fall Risk  06/03/2015 04/29/2015 02/18/2015 02/11/2015  Falls in the past year? Yes Yes Yes Yes  Number falls in past yr: '1 1 1 1  ' Injury with Fall? No No No Yes  Risk for fall due to : Impaired balance/gait Impaired balance/gait;History of fall(s) Impaired balance/gait Impaired balance/gait  Risk for fall due to (comments): - - stroke that has affected right side -  Follow up Falls evaluation completed - Falls evaluation completed Falls prevention discussed   PHQ 2/9 Scores 04/29/2015 02/18/2015 02/11/2015  PHQ - 2 Score '1 2 2  ' PHQ- 9 Score - 2 7    Assessment:   Diabetes Blood thinners-Hemorrhoids-bleeding  Plan:  RNCM called [name] at Dr. Debara [name] to report bleeding, she will report to Dr. Debara [name] RNCM to refer to SW for transportation issue [facility] CM Care Plan Problem One        Most Recent Value   Care Plan Problem One  Transportation issues   Role Documenting the Problem One  Care Management [name] for Problem One  Active   [facility]N CM Short Term Goal #1 (0-30 days)  Patient will have transportation to his appointments in [place] over the next 30 days   Interventions for Short Term Goal #1  Refer to [facility] LCSW     [name] CM Care Plan Problem Two        Most Recent Value   Care Plan Problem Two   Uncontrolled diabetes as evidenced by elevated CBGs, HGA1C and nonhealing wound.   Care Plan for Problem Two  Active   Interventions for Problem Two Long Term Goal   using teachback method reveiwed patient diet, instructed on cutting back on bread and CHO foods   THN Long Term Goal (31-90) days  Patient stated goal "I want my foot to heal", Patient foot will show improvement to wound over next 90 days  THN Long Term Goal Start Date  06/03/15 [name] as patient continues to go to wound care [name]]   [facility] CM Short Term Goal #1 (0-30 days)  Patient will take antibiotic as directed over next 14 days   THN CM Short Term Goal #1 Start Date  04/29/15   [facility] CM Short Term Goal #1 Met Date   06/03/15   Interventions for Short Term Goal #2   completed antibiotics   THN CM Short Term Goal #2 (0-30 days)  Patient will increase protein in his diet over next 14-21 days for wound healing.   [facility]N CM Short Term Goal #3 (0-30 days)  Patient CBG's will be under better control over next 30 days   [facility]N CM Short Term Goal #3 Start Date  04/29/15   Interventions for Short Term Goal #3  Using teachback reviewed diet and encouraged patient with cutting back on [facility]    [name] CM Care Plan Problem Three        Most Recent Value   Care Plan Problem Three  Medication non adherence as evidenced by patient not taking blood thinner   Role Documenting the Problem Three  Care Management Coordinator   THN CM Short Term Goal #1 (0-30 days)  Patient will start taking blood thinner until follow up appointment in the next 30 days   THN CM Short Term Goal #1 Start Date  04/29/15   [name] CM Short Term Goal #1 Met Date  06/03/15 [Patient verbalizes he is taking plavix]     Rickey Kidney E. Laymond Purser, RN, BSN, [place] [phone]

## 2015-06-03 NOTE — Telephone Encounter (Signed)
Spoke with [name], pt is reporting bright red blood on the tissue when he wipes after a bowel movement. He reports having hemorrhoids. They are concerned because he is on eliquis, plavix and celebrex. No other problems reported. Will make dr hilty aware

## 2015-06-03 NOTE — Telephone Encounter (Signed)
Left message for [name] on private voicemail of dr hilty's recommendations.

## 2015-06-03 NOTE — Telephone Encounter (Signed)
If bleeding worsens, may need to see PCP or GI doctor. Need to continue medications for now.  Dr. Lemmie Evens

## 2015-06-04 ENCOUNTER — Ambulatory Visit (HOSPITAL_COMMUNITY): Payer: Medicare Other

## 2015-06-04 ENCOUNTER — Other Ambulatory Visit: Payer: Self-pay | Admitting: Licensed Clinical Social Worker

## 2015-06-04 ENCOUNTER — Other Ambulatory Visit: Payer: Self-pay | Admitting: *Deleted

## 2015-06-04 DIAGNOSIS — R262 Difficulty in walking, not elsewhere classified: Secondary | ICD-10-CM | POA: Diagnosis not present

## 2015-06-04 DIAGNOSIS — S91109D Unspecified open wound of unspecified toe(s) without damage to nail, subsequent encounter: Secondary | ICD-10-CM | POA: Diagnosis not present

## 2015-06-04 DIAGNOSIS — R2681 Unsteadiness on feet: Secondary | ICD-10-CM | POA: Diagnosis not present

## 2015-06-04 DIAGNOSIS — E1110 Type 2 diabetes mellitus with ketoacidosis without coma: Secondary | ICD-10-CM

## 2015-06-04 DIAGNOSIS — Z794 Long term (current) use of insulin: Principal | ICD-10-CM

## 2015-06-04 NOTE — Therapy (Signed)
[place], [name], [address] Phone: [phone]   Fax:  [phone]  Wound Care Therapy  Patient Details  Name: [name] MRN: [id number] Date of Birth: 1946-03-01 No Data Recorded  Encounter Date: 06/04/2015      PT End of Session - 06/04/15 1106    Visit Number 16   Number of Visits 24   Date for PT Re-Evaluation 06/22/15   Authorization Type [employer] Medicare    Authorization Time Period 04/22/15 to 06/21/05   Authorization - Visit Number 16   Authorization - Number of Visits 20   PT Start Time 1016   PT Stop Time 1050   PT Time Calculation (min) 34 min   Activity Tolerance Patient tolerated treatment well   Behavior During Therapy [facility] for tasks assessed/performed      Past Medical History  Diagnosis Date  . Hypertension   . Diabetes mellitus     type 2   . History of valve replacement 2010    [facility] Ease bioprosthetic 32mm  . Thyroid disease   . Stroke 2003    R-sided weakness & upper extremity swelling   . Coronary artery disease   . Dyslipidemia   . At risk for sudden cardiac death 24-Jun-2013  . PAF (paroxysmal atrial fibrillation) 05/2013    converted with amiodarone to SR  . Chronic anticoagulation 05/2013    started  . S/P cardiac catheterization, Rt & Lt heart cath 06/05/2013 06/24/13  . S/P CABG x 3 2010  . GERD (gastroesophageal reflux disease)   . Cataract   . Chronic kidney disease     Patient reports he is seeing Kidney doctor in September    Past Surgical History  Procedure Laterality Date  . Coronary artery bypass graft  01/26/2009    LIMA to LAD, SVG to circumflex, SVG to PDA  . Aortic valve replacement  01/26/2009    [facility] Ease bioprosthetic 44mm valve  . Transthoracic echocardiogram  09/2011    grade 1 diastolic dysfunction; increasing valve gradient; calcified MV annulus   . Cardiac catheterization  06/05/2013    native 3 vessel disease; patent  LIMA to LAD, SVG to OM and SVG to PDA; Elevated right and left heart filling pressures, with predominantly pulmonary venous hypertension, normal PVR  . Transthoracic echocardiogram  06/03/2013    EF 25-30%, mod conc. hypertrophy, grade 3 diastolic dysfunction; LA mod dilated; calcified MV annulus; transaortic gradients are normal for the bioprosthetic valve; inf vena cava dilated (elevated CVP) - LifeVest  . Left and right heart catheterization with coronary angiogram N/A 06/05/2013    Procedure: LEFT AND RIGHT HEART CATHETERIZATION WITH CORONARY ANGIOGRAM;  Surgeon: Leonie Man, MD;  Location: [facility] CATH LAB;  Service: Cardiovascular;  Laterality: N/A;  . Graft(s) angiogram  06/05/2013    Procedure: GRAFT(S) Cyril Loosen;  Surgeon: Leonie Man, MD;  Location: [facility] CATH LAB;  Service: Cardiovascular;;    There were no vitals filed for this visit.  Visit Diagnosis:  Open toe wound, subsequent encounter  Unsteadiness  Difficulty walking      Subjective Assessment - 06/04/15 1058    Subjective Pt stated he took shower and got dressings wet yesterday, has no removed dressings.  No reports of pain in toe, does have some rectal pain with bleeding during bowel moments though did not give a pain scale.   Currently in Pain? No/denies  Wound Therapy - 06/04/15 1100    Subjective Pt stated he took shower and got dressings wet yesterday, has no removed dressings.  No reports of pain in toe, does have some rectal pain with bleeding during bowel moments though did not give a pain scale.   Pain Assessment No/denies pain   Wound Properties Date First Assessed: 04/22/15 Time First Assessed: 1020 Wound Type: Other (Comment) , open wound   Location: Toe (Comment  which one) , R great toe   Location Orientation: Right Present on Admission: Yes   Dressing Type Silver hydrofiber;Gauze (Comment)  vaseline perimeter, silver hydrofiber, 2x2 and gauze   Dressing Status Old drainage   Dressing  Change Frequency Other (Comment)  2x/week   Site / Wound Assessment Granulation tissue;Yellow  maceration perimeter   % Wound base Red or Granulating 80%   % Wound base Yellow 20%   % Wound base Black 0%   Peri-wound Assessment Edema   Margins Epibole (rolled edges)   Closure None   Drainage Amount Minimal   Drainage Description Serosanguineous   Treatment Cleansed;Debridement (Selective)   Incision Properties Date First Assessed: 04/23/15 Time First Assessed: 1738 Location: Groin Location Orientation: Left Present on Admission: No   Selective Debridement - Location R great toe    Selective Debridement - Tools Used Forceps;Scalpel   Selective Debridement - Tissue Removed slough, callous, epibole edges, maceration tissues   Wound Therapy - Clinical Statement Increased maceration perimeter of wound following shower yesterday.  Pt explained importance of changing dressings following moisture to keep surrounding tissues intact.  Selective debridement for removal of slough, callous, reduce epibole edges and maceration tissues to promote healilng.  Continued with silver hydrofiber, gauze and netting.  No reports of pain through session.     Wound Therapy - Functional Problem List non-healing wound complicated with DM and vascular compromise.    Factors Delaying/Impairing Wound Healing Altered sensation;Diabetes Mellitus;Infection - systemic/local;Multiple medical problems;Vascular compromise   Wound Therapy - Frequency Other (comment)  2x/ week   Wound Therapy - Current Recommendations Diabetic teaching;PT   Wound Plan Contnue with current PT POC with approximate dressings.   Dressing  moistened silverhydrofiber, vaseline to perimeter followed by 2x2 and 2" gauze to toe only.           Problem List Patient Active Problem List   Diagnosis Date Noted  . PVD (peripheral vascular disease) ([place])   . S/P angioplasty 04/23/2015  . Peripheral arterial occlusive disease ([place]) 04/23/2015  . PAD  (peripheral artery disease) ([place])   . Critical lower limb ischemia   . Toe ulcer, right ([place])   . Type II or unspecified type diabetes mellitus without mention of complication, uncontrolled 11/27/2013  . Acute respiratory failure with hypoxia ([place]) 11/26/2013  . CAP (community acquired pneumonia) 11/26/2013  . CHF exacerbation ([place]) 11/25/2013  . Cardiomyopathy, ischemic 06/13/2013  . At risk for sudden cardiac death 2013/06/22  . S/P cardiac catheterization, Rt & Lt heart cath 06/05/2013 June 22, 2013  . Acute on chronic combined systolic and diastolic HF (heart failure), NYHA class 3 ([name]) 06/04/2013  . Atrial flutter with rapid ventricular response ([place]) 06/02/2013  . Acute renal failure: Cr up to 1.8 06/02/2013  . Hyperkalemia 06/02/2013    Class: Acute  . LBBB (left bundle branch block) 06/01/2013  . Hypotension- secondary to AF and Diltiazem Rx 06/01/2013  . Obesity (BMI 30-39.9) 06/01/2013  . Acute on chronic diastolic heart failure - due to Afib AVR 06/01/2013  . Atrial fibrillation  with RVR- converted with Amiodarone 05/31/2013  . Stroke- Lt brain 2003 04/08/2013  . Hemiplegia affecting right dominant side ([place]) 04/08/2013  . Cataract 04/08/2013  . GERD (gastroesophageal reflux disease) 04/08/2013  . S/P CABG x 3 - 2010 04/08/2013  . S/P AVR-tissue, 2010 04/08/2013  . DM2 (diabetes mellitus, type 2) ([place]) 04/08/2013  . Dyslipidemia 04/08/2013  . SUBACROMIAL BURSITIS, LEFT 11/05/2009   Ihor [place], LPTA; CBIS [phone]  Aldona Lento 06/04/2015, 11:07 AM  [place], [name], [address] Phone: [phone]   Fax:  [phone]  Name: DIMITRIUS STEEDMAN MRN: [id number] Date of Birth: [birth year]

## 2015-06-04 NOTE — Patient Outreach (Signed)
[name]) Care Management  06/04/2015  [name] 29-Dec-1945 [id number]   Request from Burgess Amor, RN to assign [name], assigned [name], RN.  Thanks, [name]. [place], [place] Assistant Phone: [phone] Fax: [phone]

## 2015-06-04 NOTE — Patient Outreach (Signed)
Call back from [place] at MD office, no changes to medication at this time.  Patient needs to follow up with primary care physician if he has worsening problem.s Call to patient to give above instructions. Left message for return call. Plan to refer to Health coach for diabetes management. Plan to refer to [place]ley Laymond Purser, RN, BSN, [place] [phone]

## 2015-06-04 NOTE — Patient Outreach (Signed)
Assessment:  CSW received referral on client on 06/04/15.  Burgess Amor RN made referral for client to CSW related to financial needs and transport needs of client.  Client said he drives himself to appointments locally but has difficulty driving in [place].  He said he will schedule an appointment with Dr. Debara Pickett, [place], in [place] soon. He said his niece has also helped him occasionally with transport assistance.  He said he had adequate food supply.  He said he has his prescribed medications.  He said he is taking medications as normal.  He said he is receiving bills due from [facility].  He said he also owes money for medical care to [facility].   He said he has discussed Medicaid application but understands he earns too much to qualify for Medicaid.  He said he receives monthly Social Security check. CSW discussed [facility] Financial Assessment Form with client on 06/04/15  Client did not feel comfortable discussing amount earned each month. Client chose not to complete Financial Assessment Form with CSW on 06/04/15.    Client said he has reduced family support.  He said his nephew owns mobile home where he lives.  But, client still has home expenses like lot rent and taxes on home which he pays as needed.  Client said he has been able to obtain his prescribed medications.  Client has [employer]. Client receives food stamps benefit monthly but it is the lowest food stamp benefit allowable. He said food stamps benefit only helps him to purchase a few small food items each month  Client and CSW spoke of transport needs of client.  Client did not want to complete Financial Assessment Form on 06/04/15. CSW spoke with client about [employer] transport agency in [place], [name] and that [employer] agency might be the most affordable for client to use for his medical transport needs to and from [place], [name] appointments. Client has adequate food supply.  Client said  he is on the waiting list for an apartment at  apartment complex in [place], [name].  [place] and client agreed for CSW to call client next week to further discuss needs of client.  Plan: CSW to call client next week to assess needs of client. Client to consider calling [employer] agency in [place], [name] to help client with client transport needs to medical appointments in [place].  Norva Riffle.Daisee Centner MSW, LCSW Licensed Clinical Social Worker [facility] Care Management [phone]

## 2015-06-05 NOTE — Patient Outreach (Signed)
Call to patient, spoke with patient, instructed patient on instructions to continue blood thinners per Dr. Debara Pickett, if bleeding worsens he needs to follow up with Primary care. Patient verbalized instructions. Also, reminded patient of referral to SW, he states that SW has called and will be out next week. Also, reminded of transfer of case to [place] for Diabetes education. Patient verbalized understanding. Community RNCM closing case. Royetta Crochet. Laymond Purser, RN, BSN, [place] [phone]

## 2015-06-09 ENCOUNTER — Other Ambulatory Visit: Payer: Self-pay

## 2015-06-09 ENCOUNTER — Ambulatory Visit (HOSPITAL_COMMUNITY): Payer: Medicare Other | Admitting: Physical Therapy

## 2015-06-09 DIAGNOSIS — S91109D Unspecified open wound of unspecified toe(s) without damage to nail, subsequent encounter: Secondary | ICD-10-CM | POA: Diagnosis not present

## 2015-06-09 DIAGNOSIS — R2681 Unsteadiness on feet: Secondary | ICD-10-CM | POA: Diagnosis not present

## 2015-06-09 DIAGNOSIS — R262 Difficulty in walking, not elsewhere classified: Secondary | ICD-10-CM | POA: Diagnosis not present

## 2015-06-09 NOTE — Patient Outreach (Signed)
[name]  06/09/2015  [name] 1946/04/29 [id number]   Introduction phone call to patient for health coach.  Explained to patient health coach and role.  Patient receptive to the call.  Denies any problems or concerns.    Plan: RN Health Coach will contact patient within one month and patient agrees to next outreach.    [name], RN, MSN [place] [phone]

## 2015-06-09 NOTE — Therapy (Signed)
[place], [name], [address] Phone: [phone]   Fax:  [phone]  Wound Care Therapy  Patient Details  Name: [name] MRN: [id number] Date of Birth: 12-29-1945 No Data Recorded  Encounter Date: 06/09/2015    Past Medical History  Diagnosis Date  . Hypertension   . Diabetes mellitus     type 2   . History of valve replacement 2010    [facility] Ease bioprosthetic 10m  . Thyroid disease   . Stroke 2003    R-sided weakness & upper extremity swelling   . Coronary artery disease   . Dyslipidemia   . At risk for sudden cardiac death 115-Nov-2014 . PAF (paroxysmal atrial fibrillation) 05/2013    converted with amiodarone to SR  . Chronic anticoagulation 05/2013    started  . S/P cardiac catheterization, Rt & Lt heart cath 06/05/2013 115-Nov-2014 . S/P CABG x 3 2010  . GERD (gastroesophageal reflux disease)   . Cataract   . Chronic kidney disease     Patient reports he is seeing Kidney doctor in September    Past Surgical History  Procedure Laterality Date  . Coronary artery bypass graft  01/26/2009    LIMA to LAD, SVG to circumflex, SVG to PDA  . [facility]ortic valve [facility]  [facility]/26/2009    [facility]  . Transthoracic echocardiogram  09/2011    grade 1 diastolic dysfunction; increasing valve gradient; calcified MV annulus   . Cardiac catheterization  06/05/2013    native 3 vessel disease; patent LIMA to LAD, SVG to OM and SVG to PDA; Elevated right and left heart filling pressures, with predominantly pulmonary venous hypertension, normal PVR  . Transthoracic echocardiogram  06/03/2013    EF 25-30%, mod conc. hypertrophy, grade 3 diastolic dysfunction; LA mod dilated; calcified MV annulus; transaortic gradients are normal for the bioprosthetic valve; inf vena cava dilated (elevated CVP) - LifeVest  . Left and right heart catheterization with coronary angiogram N/A  06/05/2013    Procedure: LEFT AND RIGHT HEART CATHETERIZATION WITH CORONARY ANGIOGRAM;  Surgeon: DaLeonie ManMD;  [facility];  Service: Cardiovascular;  Laterality: N/A;  . Graft(s) angiogram  06/05/2013    Procedure: GRAFT(S) ANCyril Loosen Surgeon: DaLeonie ManMD;  [facility];  Service: Cardiovascular;;    There were no vitals filed for this visit.  Visit Diagnosis:  Open toe wound, subsequent encounter  Unsteadiness  Difficulty walking                 Wound Therapy - 06/09/15 1209    Subjective Pt reports no pain or difficulties   Pain Assessment No/denies pain   Wound Properties Date First Assessed: 04/22/15 Time First Assessed: 1020 Wound Type: Other (Comment) , open wound   Location: Toe (Comment  which one) , R great toe   Location Orientation: Right Present on Admission: Yes   Dressing Type Silver hydrofiber;Gauze (Comment)  vaseline perimeter, silver hydrofiber, 2x2 and gauze   Dressing Changed Changed   Dressing Status Old drainage   Dressing Change Frequency Other (Comment)  2x/week   Site / Wound Assessment Granulation tissue;Yellow  maceration perimeter   % Wound base Red or Granulating 85%   % Wound base Yellow 15%   % Wound base Black 0%   Peri-wound Assessment Edema;Maceration   Margins Attached edges (approximated)   Closure None   Drainage Amount Minimal  Drainage Description Serosanguineous   Treatment Cleansed;Debridement (Selective)   Incision Properties Date First Assessed: 04/23/15 Time First Assessed: 1738 Location: Groin Location Orientation: Left Present on Admission: No   Selective Debridement - Location R great toe    Selective Debridement - Tools Used Forceps;Scalpel   Selective Debridement - Tissue Removed slough, callous edges, maceration tissues   Wound Therapy - Clinical Statement Increased maceration and signs of decreased circulation due to toe cap placed over wound bandages.  Discarded this.  Wound  with overall improvement with increased granulation and approximation.  Continued with silver hydrofiber to absorb drainage and decrease maceration from perimeter.  No signs or symptoms of infection.    Wound Therapy - Functional Problem List non-healing wound complicated with DM and vascular compromise.    Factors Delaying/Impairing Wound Healing Altered sensation;Diabetes Mellitus;Infection - systemic/local;Multiple medical problems;Vascular compromise   Wound Therapy - Frequency Other (comment)  2x/ week   Wound Therapy - Current Recommendations Diabetic teaching;PT   Wound Plan Contnue with current PT POC with approximate dressings.   Dressing  silverhydrofiber, vaseline to perimeter followed by 2x2 and 2" gauze to toe only.    Decrease Necrotic Tissue to 20%   Decrease Necrotic Tissue - Progress Met   Increase Granulation Tissue to 80%   Increase Granulation Tissue - Progress Met   Decrease Length/Width/Depth by (cm) 1.0   Decrease Length/Width/Depth - Progress Progressing toward goal   Patient/Family will be able to  independently dress and inspect wound for signs of infection   Patient/Family Instruction Goal - Progress Progressing toward goal   Additional Wound Therapy Goal Full wound healing    Additional Wound Therapy Goal - Progress Progressing toward goal                              Problem List Patient Active Problem List   Diagnosis Date Noted  . PVD (peripheral vascular disease) ([place])   . S/P angioplasty 04/23/2015  . Peripheral arterial occlusive disease ([place]) 04/23/2015  . PAD (peripheral artery disease) ([place])   . Critical lower limb ischemia   . Toe ulcer, right ([place])   . Type II or unspecified type diabetes mellitus without mention of complication, uncontrolled 11/27/2013  . Acute respiratory failure with hypoxia ([place]) 11/26/2013  . CAP (community acquired pneumonia) 11/26/2013  . CHF exacerbation ([place]) 11/25/2013  . Cardiomyopathy,  ischemic 06/13/2013  . At risk for sudden cardiac death 06-09-2013  . S/P cardiac catheterization, Rt & Lt heart cath 06/05/2013 09-Jun-2013  . Acute on chronic combined systolic and diastolic HF (heart failure), NYHA class 3 ([place]) 06/04/2013  . Atrial flutter with rapid ventricular response ([place]) 06/02/2013  . Acute renal failure: Cr up to 1.8 06/02/2013  . Hyperkalemia 06/02/2013    Class: Acute  . LBBB (left bundle branch block) 06/01/2013  . Hypotension- secondary to AF and Diltiazem Rx 06/01/2013  . Obesity (BMI 30-39.9) 06/01/2013  . Acute on chronic diastolic heart failure - due to Afib AVR 06/01/2013  . Atrial fibrillation with RVR- converted with Amiodarone 05/31/2013  . Stroke- Lt brain 2003 04/08/2013  . Hemiplegia affecting right dominant side ([place]) 04/08/2013  . Cataract 04/08/2013  . GERD (gastroesophageal reflux disease) 04/08/2013  . S/P CABG x 3 - 2010 04/08/2013  . S/P AVR-tissue, 2010 04/08/2013  . DM2 (diabetes mellitus, type 2) ([place]) 04/08/2013  . Dyslipidemia 04/08/2013  . SUBACROMIAL BURSITIS, LEFT 11/05/2009    Teena Irani, PTA/CLT  [id number]  06/09/2015, 12:15 PM  [place] [address] [place] [address], [name], [address] Phone: [phone]   Fax:  [phone]  Name: JESHUA RANSFORD MRN: [id number] Date of Birth: 1945-10-09

## 2015-06-10 ENCOUNTER — Other Ambulatory Visit: Payer: Self-pay | Admitting: Internal Medicine

## 2015-06-11 ENCOUNTER — Ambulatory Visit (HOSPITAL_COMMUNITY): Payer: Medicare Other

## 2015-06-11 DIAGNOSIS — R2681 Unsteadiness on feet: Secondary | ICD-10-CM

## 2015-06-11 DIAGNOSIS — S91109D Unspecified open wound of unspecified toe(s) without damage to nail, subsequent encounter: Secondary | ICD-10-CM | POA: Diagnosis not present

## 2015-06-11 DIAGNOSIS — R262 Difficulty in walking, not elsewhere classified: Secondary | ICD-10-CM

## 2015-06-11 NOTE — Therapy (Signed)
[place], [name], [address] Phone: [phone]   Fax:  [phone]  Wound Care Therapy  Patient Details  Name: [name] MRN: [id number] Date of Birth: 1945/11/14 No Data Recorded  Encounter Date: 06/11/2015      PT End of Session - 06/11/15 0920    Visit Number 17   Number of Visits 24   Date for PT Re-Evaluation 06/22/15   Authorization Type UHC Medicare    Authorization Time Period 04/22/15 to 06/21/05   Authorization - Visit Number 7   Authorization - Number of Visits 20   PT Start Time 0830   PT Stop Time 0905   PT Time Calculation (min) 35 min   Activity Tolerance Patient tolerated treatment well   Behavior During Therapy [facility] for tasks assessed/performed      Past Medical History  Diagnosis Date  . Hypertension   . Diabetes mellitus     type 2   . History of valve replacement 2010    [facility] Ease bioprosthetic 73mm  . Thyroid disease   . Stroke 2003    R-sided weakness & upper extremity swelling   . Coronary artery disease   . Dyslipidemia   . At risk for sudden cardiac death 06/17/2013  . PAF (paroxysmal atrial fibrillation) 05/2013    converted with amiodarone to SR  . Chronic anticoagulation 05/2013    started  . S/P cardiac catheterization, Rt & Lt heart cath 06/05/2013 17-Jun-2013  . S/P CABG x 3 2010  . GERD (gastroesophageal reflux disease)   . Cataract   . Chronic kidney disease     Patient reports he is seeing Kidney doctor in September    Past Surgical History  Procedure Laterality Date  . Coronary artery bypass graft  01/26/2009    LIMA to LAD, SVG to circumflex, SVG to PDA  . Aortic valve replacement  01/26/2009    [facility] Ease bioprosthetic 75mm valve  . Transthoracic echocardiogram  09/2011    grade 1 diastolic dysfunction; increasing valve gradient; calcified MV annulus   . Cardiac catheterization  06/05/2013    native 3 vessel disease; patent  LIMA to LAD, SVG to OM and SVG to PDA; Elevated right and left heart filling pressures, with predominantly pulmonary venous hypertension, normal PVR  . Transthoracic echocardiogram  06/03/2013    EF 25-30%, mod conc. hypertrophy, grade 3 diastolic dysfunction; LA mod dilated; calcified MV annulus; transaortic gradients are normal for the bioprosthetic valve; inf vena cava dilated (elevated CVP) - LifeVest  . Left and right heart catheterization with coronary angiogram N/A 06/05/2013    Procedure: LEFT AND RIGHT HEART CATHETERIZATION WITH CORONARY ANGIOGRAM;  Surgeon: Leonie Man, MD;  Location: [facility] C[facility] LAB;  Service: Cardiovascular;  Laterality: N/A;  . Graft(s) angiogram  06/05/2013    Procedure: GRAFT(S) Cyril Loosen;  Surgeon: Leonie Man, MD;  Location: [facility] C[facility] LAB;  Service: Cardiovascular;;    There were no vitals filed for this visit.  Visit Diagnosis:  Open toe wound, subsequent encounter  Unsteadiness  Difficulty walking      Subjective Assessment - 06/11/15 0913    Subjective No pain today, dressings intact   Currently in Pain? No/denies           Wound Therapy - 06/11/15 0913    Subjective No pain today, dressings intact   Pain Assessment No/denies pain   Wound Properties Date First Assessed: 04/22/15 Time First Assessed: 1020 Wound  Type: Other (Comment) , open wound   Location: Toe (Comment  which one) , R great toe   Location Orientation: Right Present on Admission: Yes   Dressing Type Silver hydrofiber;Gauze (Comment)  silver hydrofiber, vaseline perimeter, orange foam, gauze   Dressing Changed Changed   Dressing Status Old drainage   Dressing Change Frequency Other (Comment)  2x/week   Site / Wound Assessment Granulation tissue;Yellow   % Wound base Red or Granulating 90%   % Wound base Yellow 10%   % Wound base Black 0%   Peri-wound Assessment Edema   Margins Attached edges (approximated)   Closure None   Drainage Amount Minimal   Drainage  Description Serosanguineous   Treatment Cleansed;Debridement (Selective)   Incision Properties Date First Assessed: 04/23/15 Time First Assessed: 1738 Location: Groin Location Orientation: Left Present on Admission: No   Selective Debridement - Location R great toe    Selective Debridement - Tools Used Forceps;Scalpel   Selective Debridement - Tissue Removed slough, callous edges, maceration tissues   Wound Therapy - Clinical Statement Improved approximation of wound, minimal maceration tissues this session.  Noted red spot on medial aspect of great toe, informed evaluation PT.  Added orange foam surrounding red spot for pressure relief, if still there next session call MD requesting antibiotics.  Continued with silver hydrofiber for drainage absorption and gauze dressings.  No reporrts of pain through session.   Wound Therapy - Functional Problem List non-healing wound complicated with DM and vascular compromise.    Factors Delaying/Impairing Wound Healing Altered sensation;Diabetes Mellitus;Infection - systemic/local;Multiple medical problems;Vascular compromise   Wound Therapy - Frequency Other (comment)  2x/week   Wound Therapy - Current Recommendations Diabetic teaching;PT   Wound Plan Contnue with current PT POC with approximate dressings.   Dressing  silverhydrofiber, vaseline to perimeter, orange foam over red spot medial great toe followed by 2x2 and 2" gauze to toe only.          Problem List Patient Active Problem List   Diagnosis Date Noted  . PVD (peripheral vascular disease) ([place])   . S/P angioplasty 04/23/2015  . Peripheral arterial occlusive disease ([place]) 04/23/2015  . PAD (peripheral artery disease) ([place])   . Critical lower limb ischemia   . Toe ulcer, right ([place])   . Type II or unspecified type diabetes mellitus without mention of complication, uncontrolled 11/27/2013  . Acute respiratory failure with hypoxia ([place]) 11/26/2013  . CAP (community acquired pneumonia)  11/26/2013  . CHF exacerbation ([place]) 11/25/2013  . Cardiomyopathy, ischemic 06/13/2013  . At risk for sudden cardiac death 06/23/13  . S/P cardiac catheterization, Rt & Lt heart cath 06/05/2013 06-23-2013  . Acute on chronic combined systolic and diastolic HF (heart failure), NYHA class 3 ([place]) 06/04/2013  . Atrial flutter with rapid ventricular response ([place]) 06/02/2013  . Acute renal failure: Cr up to 1.8 06/02/2013  . Hyperkalemia 06/02/2013    Class: Acute  . LBBB (left bundle branch block) 06/01/2013  . Hypotension- secondary to AF and Diltiazem Rx 06/01/2013  . Obesity (BMI 30-39.9) 06/01/2013  . Acute on chronic diastolic heart failure - due to Afib AVR 06/01/2013  . Atrial fibrillation with RVR- converted with Amiodarone 05/31/2013  . Stroke- Lt brain 2003 04/08/2013  . Hemiplegia affecting right dominant side ([place]) 04/08/2013  . Cataract 04/08/2013  . GERD (gastroesophageal reflux disease) 04/08/2013  . S/P CABG x 3 - 2010 04/08/2013  . S/P AVR-tissue, 2010 04/08/2013  . DM2 (diabetes mellitus, type 2) ([place]) 04/08/2013  .  Dyslipidemia 04/08/2013  . SUBACROMIAL BURSITIS, LEFT 11/05/2009   Ihor Austin, LPTA; CBIS [phone]  Aldona Lento 06/11/2015, 9:21 AM  [place], [name], [address] Phone: [phone]   Fax:  [phone]  Name: [name] MRN: [id number] Date of Birth: 02-28-1946

## 2015-06-16 ENCOUNTER — Ambulatory Visit (HOSPITAL_COMMUNITY): Payer: Medicare Other | Attending: Orthopaedic Surgery

## 2015-06-16 ENCOUNTER — Telehealth (HOSPITAL_COMMUNITY): Payer: Self-pay

## 2015-06-16 DIAGNOSIS — R262 Difficulty in walking, not elsewhere classified: Secondary | ICD-10-CM | POA: Insufficient documentation

## 2015-06-16 DIAGNOSIS — S91109D Unspecified open wound of unspecified toe(s) without damage to nail, subsequent encounter: Secondary | ICD-10-CM | POA: Diagnosis not present

## 2015-06-16 DIAGNOSIS — R2681 Unsteadiness on feet: Secondary | ICD-10-CM

## 2015-06-16 NOTE — Telephone Encounter (Signed)
No show, called and left message informing pt of missed apt today, informed pt next apt date and time with contract info.  [address] [place]; CBIS [phone]

## 2015-06-16 NOTE — Therapy (Signed)
[place], [name], [address] Phone: [phone]   Fax:  [phone]  Wound Care Therapy  Patient Details  Name: [name] MRN: [id number] Date of Birth: 1946-05-25 No Data Recorded  Encounter Date: 06/16/2015      PT End of Session - 06/16/15 1240    Visit Number 18   Number of Visits 24   Date for PT Re-Evaluation 06/22/15   Authorization Type UHC Medicare    Authorization Time Period 04/22/15 to 06/21/05   Authorization - Visit Number 18   Authorization - Number of Visits 20   PT Start Time 3709   PT Stop Time 0938   PT Time Calculation (min) 24 min   Activity Tolerance Patient tolerated treatment well   Behavior During Therapy [facility] for tasks assessed/performed      Past Medical History  Diagnosis Date  . Hypertension   . Diabetes mellitus     type 2   . History of valve replacement 2010    [facility] Ease bioprosthetic 63mm  . Thyroid disease   . Stroke 2003    R-sided weakness & upper extremity swelling   . Coronary artery disease   . Dyslipidemia   . At risk for sudden cardiac death 06-26-13  . PAF (paroxysmal atrial fibrillation) 05/2013    converted with amiodarone to SR  . Chronic anticoagulation 05/2013    started  . S/P cardiac catheterization, Rt & Lt heart cath 06/05/2013 06-26-13  . S/P CABG x 3 2010  . GERD (gastroesophageal reflux disease)   . Cataract   . Chronic kidney disease     Patient reports he is seeing Kidney doctor in September    Past Surgical History  Procedure Laterality Date  . Coronary artery bypass graft  01/26/2009    LIMA to LAD, SVG to circumflex, SVG to PDA  . Aortic valve replacement  01/26/2009    [facility] Ease bioprosthetic 55mm valve  . Transthoracic echocardiogram  09/2011    grade 1 diastolic dysfunction; increasing valve gradient; calcified MV annulus   . Cardiac catheterization  06/05/2013    native 3 vessel disease; patent LIMA  to LAD, SVG to OM and SVG to PDA; Elevated right and left heart filling pressures, with predominantly pulmonary venous hypertension, normal PVR  . Transthoracic echocardiogram  06/03/2013    EF 25-30%, mod conc. hypertrophy, grade 3 diastolic dysfunction; LA mod dilated; calcified MV annulus; transaortic gradients are normal for the bioprosthetic valve; inf vena cava dilated (elevated CVP) - LifeVest  . Left and right heart catheterization with coronary angiogram N/A 06/05/2013    Procedure: LEFT AND RIGHT HEART CATHETERIZATION WITH CORONARY ANGIOGRAM;  Surgeon: Leonie Man, MD;  [facility];  Service: Cardiovascular;  Laterality: N/A;  . Graft(s) angiogram  06/05/2013    Procedure: GRAFT(S) Cyril Loosen;  Surgeon: Leonie Man, MD;  Location: [facility] CATH LAB;  Service: Cardiovascular;;    There were no vitals filed for this visit.  Visit Diagnosis:  Open toe wound, subsequent encounter  Unsteadiness  Difficulty walking      Subjective Assessment - 06/16/15 1235    Subjective No pain today, dressing intact   Currently in Pain? No/denies                   Wound Therapy - 06/16/15 1236    Subjective No pain today, dressing intact   Pain Assessment No/denies pain   Wound Properties Date  First Assessed: 04/22/15 Time First Assessed: 1020 Wound Type: Other (Comment) , open wound   Location: Toe (Comment  which one) , R great toe   Location Orientation: Right Present on Admission: Yes   Dressing Type Silver hydrofiber;Gauze (Comment)  silver hydrofiber, vaseline perimeter, gauze   Dressing Changed Changed   Dressing Status Old drainage   Dressing Change Frequency Other (Comment)  2x/week   Site / Wound Assessment Granulation tissue;Yellow   % Wound base Red or Granulating 90%   % Wound base Yellow 10%   % Wound base Black 0%   Peri-wound Assessment Edema   Margins Attached edges (approximated)   Closure None   Drainage Amount Minimal   Drainage Description  Serosanguineous   Treatment Cleansed;Debridement (Selective)   Incision Properties Date First Assessed: 04/23/15 Time First Assessed: 1738 Location: Groin Location Orientation: Left Present on Admission: No   Selective Debridement - Location R great toe    Selective Debridement - Tools Used Forceps;Scalpel   Selective Debridement - Tissue Removed slough, callous edges, maceration tissues   Wound Therapy - Clinical Statement Pt late for apt today,  Increased redness Rt great toe with briuse on medial side, pt encouraged to call MD and go to doctor's office following treatment.  Selective debridement for removal of slough and callous surrounding wound to promote healing.  Continued with silver hydrofiber and gauze.   Wound Therapy - Functional Problem List non-healing wound complicated with DM and vascular compromise.    Factors Delaying/Impairing Wound Healing Altered sensation;Diabetes Mellitus;Infection - systemic/local;Multiple medical problems;Vascular compromise   Wound Therapy - Frequency Other (comment)   Wound Therapy - Current Recommendations Diabetic teaching;PT   Wound Plan F/U with MD visit and antibiotics for Rt great toe.  Contnue with current PT POC with approximate dressings.   Dressing  silverhydrofiber, vaseline to perimeter, 2x2 and gauze        Problem List Patient Active Problem List   Diagnosis Date Noted  . PVD (peripheral vascular disease) ([place])   . S/P angioplasty 04/23/2015  . Peripheral arterial occlusive disease ([place]) 04/23/2015  . PAD (peripheral artery disease) ([place])   . Critical lower limb ischemia   . Toe ulcer, right ([place])   . Type II or unspecified type diabetes mellitus without mention of complication, uncontrolled 11/27/2013  . Acute respiratory failure with hypoxia ([place]) 11/26/2013  . CAP (community acquired pneumonia) 11/26/2013  . CHF exacerbation ([place]) 11/25/2013  . Cardiomyopathy, ischemic 06/13/2013  . At risk for sudden cardiac death  07/04/13  . S/P cardiac catheterization, Rt & Lt heart cath 06/05/2013 07-04-2013  . Acute on chronic combined systolic and diastolic HF (heart failure), NYHA class 3 ([place]) 06/04/2013  . Atrial flutter with rapid ventricular response ([place]) 06/02/2013  . Acute renal failure: Cr up to 1.8 06/02/2013  . Hyperkalemia 06/02/2013    Class: Acute  . LBBB (left bundle branch block) 06/01/2013  . Hypotension- secondary to AF and Diltiazem Rx 06/01/2013  . Obesity (BMI 30-39.9) 06/01/2013  . Acute on chronic diastolic heart failure - due to Afib AVR 06/01/2013  . Atrial fibrillation with RVR- converted with Amiodarone 05/31/2013  . Stroke- Lt brain 2003 04/08/2013  . Hemiplegia affecting right dominant side ([place]) 04/08/2013  . Cataract 04/08/2013  . GERD (gastroesophageal reflux disease) 04/08/2013  . S/P CABG x 3 - 2010 04/08/2013  . S/P AVR-tissue, 2010 04/08/2013  . DM2 (diabetes mellitus, type 2) ([place]) 04/08/2013  . Dyslipidemia 04/08/2013  . SUBACROMIAL BURSITIS, LEFT 11/05/2009  [address] LPTA; [place]  Aldona Lento 06/16/2015, 12:42 PM  [place], [name], [address] Phone: [phone]   Fax:  [phone]  Name: [name] MRN: [id number] Date of Birth: Feb 22, 1946

## 2015-06-18 ENCOUNTER — Ambulatory Visit (HOSPITAL_COMMUNITY): Payer: Medicare Other | Admitting: Physical Therapy

## 2015-06-18 DIAGNOSIS — R262 Difficulty in walking, not elsewhere classified: Secondary | ICD-10-CM

## 2015-06-18 DIAGNOSIS — R2681 Unsteadiness on feet: Secondary | ICD-10-CM | POA: Diagnosis not present

## 2015-06-18 DIAGNOSIS — S91109D Unspecified open wound of unspecified toe(s) without damage to nail, subsequent encounter: Secondary | ICD-10-CM

## 2015-06-18 NOTE — Therapy (Signed)
[place], [name], [address] Phone: [phone]   Fax:  [phone]  Wound Care Therapy  Patient Details  Name: [name] MRN: [id number] Date of Birth: 1945/11/13 No Data Recorded  Encounter Date: 06/18/2015      PT End of Session - 06/18/15 0902    Visit Number 19   Number of Visits 24   Date for PT Re-Evaluation 06/22/15   Authorization Type UHC Medicare    Authorization Time Period 04/22/15 to 06/21/05   Authorization - Visit Number 3   Authorization - Number of Visits 20   PT Start Time 0805   PT Stop Time 0840   PT Time Calculation (min) 35 min   Activity Tolerance Patient tolerated treatment well   Behavior During Therapy [facility] for tasks assessed/performed      Past Medical History  Diagnosis Date  . Hypertension   . Diabetes mellitus     type 2   . History of valve replacement 2010    [facility] Ease bioprosthetic 47mm  . Thyroid disease   . Stroke 2003    R-sided weakness & upper extremity swelling   . Coronary artery disease   . Dyslipidemia   . At risk for sudden cardiac death 2013-06-11  . PAF (paroxysmal atrial fibrillation) 05/2013    converted with amiodarone to SR  . Chronic anticoagulation 05/2013    started  . S/P cardiac catheterization, Rt & Lt heart cath 06/05/2013 Jun 11, 2013  . S/P CABG x 3 2010  . GERD (gastroesophageal reflux disease)   . Cataract   . Chronic kidney disease     Patient reports he is seeing Kidney doctor in September    Past Surgical History  Procedure Laterality Date  . Coronary artery bypass graft  01/26/2009    LIMA to LAD, SVG to circumflex, SVG to PDA  . Aortic valve replacement  01/26/2009    [facility] Ease bioprosthetic 60mm valve  . Transthoracic echocardiogram  09/2011    grade 1 diastolic dysfunction; increasing valve gradient; calcified MV annulus   . Cardiac catheterization  06/05/2013    native 3 vessel disease; patent LIMA  to LAD, SVG to OM and SVG to PDA; Elevated right and left heart filling pressures, with predominantly pulmonary venous hypertension, normal PVR  . Transthoracic echocardiogram  06/03/2013    EF 25-30%, mod conc. hypertrophy, grade 3 diastolic dysfunction; LA mod dilated; calcified MV annulus; transaortic gradients are normal for the bioprosthetic valve; inf vena cava dilated (elevated CVP) - LifeVest  . Left and right heart catheterization with coronary angiogram N/A 06/05/2013    Procedure: LEFT AND RIGHT HEART CATHETERIZATION WITH CORONARY ANGIOGRAM;  Surgeon: Leonie Man, MD;  Location[facility] C[facility] LAB;  Service: Cardiovascular;  Laterality: N/A;  . Graft(s) angiogram  06/05/2013    Procedure: GRAFT(S) Cyril Loosen;  Surgeon: Leonie Man, MD;  Location: [facility] CATH LAB;  Service: Cardiovascular;;    There were no vitals filed for this visit.  Visit Diagnosis:  Open toe wound, subsequent encounter  Unsteadiness  Difficulty walking                 Wound Therapy - 06/18/15 0858    Subjective No pain today, dressing intact   Pain Assessment No/denies pain   Wound Properties Date First Assessed: 04/22/15 Time First Assessed: 1020 Wound Type: Other (Comment) , open wound   Location: Toe (Comment  which one) , R great toe  Location Orientation: Right Present on Admission: Yes   Dressing Type Silver hydrofiber;Gauze (Comment)  silver hydrofiber, vaseline perimeter, gauze   Dressing Changed Changed   Dressing Status Old drainage   Dressing Change Frequency Other (Comment)  2x/week   Site / Wound Assessment Granulation tissue;Yellow   % Wound base Red or Granulating 90%   % Wound base Yellow 10%   % Wound base Black 0%   Peri-wound Assessment Edema   Margins Attached edges (approximated)   Closure None   Drainage Amount Minimal   Drainage Description Serosanguineous   Treatment Cleansed;Debridement (Selective)   Incision Properties Date First Assessed: 04/23/15 Time First  Assessed: 1738 Location: Groin Location Orientation: Left Present on Admission: No   Selective Debridement - Location R great toe    Selective Debridement - Tools Used Forceps;Scalpel   Selective Debridement - Tissue Removed slough, callous edges, maceration tissues   Wound Therapy - Clinical Statement Overall improvment noted today in great toe.  Decreased redness, however bruising still present from sponge.  Primary wound on end of great toe continues to approximate and heal.  No signs or symptoms of infection.  Returns to MD 11/08.   Wound Therapy - Functional Problem List non-healing wound complicated with DM and vascular compromise.    Factors Delaying/Impairing Wound Healing Altered sensation;Diabetes Mellitus;Infection - systemic/local;Multiple medical problems;Vascular compromise   Wound Therapy - Frequency Other (comment)   Wound Therapy - Current Recommendations Diabetic teaching;PT   Wound Plan F/U with MD visit and antibiotics for Rt great toe.  Contnue with current PT POC with approximate dressings. Measure with Gcodes next session.   Dressing  silverhydrofiber, vaseline to perimeter, 2x2 and gauze                              Problem List Patient Active Problem List   Diagnosis Date Noted  . PVD (peripheral vascular disease) ([place])   . S/P angioplasty 04/23/2015  . Peripheral arterial occlusive disease ([place]) 04/23/2015  . PAD (peripheral artery disease) ([place])   . Critical lower limb ischemia   . Toe ulcer, right ([place])   . Type II or unspecified type diabetes mellitus without mention of complication, uncontrolled 11/27/2013  . Acute respiratory failure with hypoxia ([place]) 11/26/2013  . CAP (community acquired pneumonia) 11/26/2013  . CHF exacerbation ([place]) 11/25/2013  . Cardiomyopathy, ischemic 06/13/2013  . At risk for sudden cardiac death 2013-06-09  . S/P cardiac catheterization, Rt & Lt heart cath 06/05/2013 09-Jun-2013  . Acute on chronic combined  systolic and diastolic HF (heart failure), NYHA class 3 ([place]) 06/04/2013  . Atrial flutter with rapid ventricular response ([place]) 06/02/2013  . Acute renal failure: Cr up to 1.8 06/02/2013  . Hyperkalemia 06/02/2013    Class: Acute  . LBBB (left bundle branch block) 06/01/2013  . Hypotension- secondary to AF and Diltiazem Rx 06/01/2013  . Obesity (BMI 30-39.9) 06/01/2013  . Acute on chronic diastolic heart failure - due to Afib AVR 06/01/2013  . Atrial fibrillation with RVR- converted with Amiodarone 05/31/2013  . Stroke- Lt brain 2003 04/08/2013  . Hemiplegia affecting right dominant side ([place]) 04/08/2013  . Cataract 04/08/2013  . GERD (gastroesophageal reflux disease) 04/08/2013  . S/P CABG x 3 - 2010 04/08/2013  . S/P AVR-tissue, 2010 04/08/2013  . DM2 (diabetes mellitus, type 2) ([place]) 04/08/2013  . Dyslipidemia 04/08/2013  . SUBACROMIAL BURSITIS, LEFT 11/05/2009    Teena Irani, PTA/CLT [phone]  06/18/2015,  9:[place] [address] [place], [name], [address] Phone: [phone]   Fax:  [phone]  Name: [name] MRN: [id number] Date of Birth: 1946-02-22

## 2015-06-20 ENCOUNTER — Other Ambulatory Visit: Payer: Self-pay | Admitting: Internal Medicine

## 2015-06-23 ENCOUNTER — Ambulatory Visit (HOSPITAL_COMMUNITY): Payer: Medicare Other | Admitting: Physical Therapy

## 2015-06-23 DIAGNOSIS — S91109D Unspecified open wound of unspecified toe(s) without damage to nail, subsequent encounter: Secondary | ICD-10-CM | POA: Diagnosis not present

## 2015-06-23 DIAGNOSIS — R2681 Unsteadiness on feet: Secondary | ICD-10-CM | POA: Diagnosis not present

## 2015-06-23 DIAGNOSIS — R262 Difficulty in walking, not elsewhere classified: Secondary | ICD-10-CM

## 2015-06-23 NOTE — Therapy (Signed)
[facility] [address] [place], [place], [address] Phone: [phone]   Fax:  [phone]  Wound Care Therapy  Patient Details  Name: [name] MRN: [id number] Date of Birth: 11/24/1945 No Data Recorded  Encounter Date: 06/23/2015      PT End of Session - 06/23/15 1012    Visit Number 20   Number of Visits 30   Date for PT Re-Evaluation 07/21/15   Authorization Type UHC Medicare    Authorization Time Period 04/22/15 to 06/21/05   Authorization - Visit Number 20   Authorization - Number of Visits 30   PT Start Time 0850   PT Stop Time 0930   PT Time Calculation (min) 40 min   Activity Tolerance Patient tolerated treatment well   Behavior During Therapy [facility] for tasks assessed/performed      Past Medical History  Diagnosis Date  . Hypertension   . Diabetes mellitus     type 2   . History of valve replacement 2010    [facility] Ease bioprosthetic 23mm  . Thyroid disease   . Stroke 2003    R-sided weakness & upper extremity swelling   . Coronary artery disease   . Dyslipidemia   . At risk for sudden cardiac death 06/07/2013  . PAF (paroxysmal atrial fibrillation) 05/2013    converted with amiodarone to SR  . Chronic anticoagulation 05/2013    started  . S/P cardiac catheterization, Rt & Lt heart cath 06/05/2013 06/07/2013  . S/P CABG x 3 2010  . GERD (gastroesophageal reflux disease)   . Cataract   . Chronic kidney disease     Patient reports he is seeing Kidney doctor in September    Past Surgical History  Procedure Laterality Date  . Coronary artery bypass graft  01/26/2009    LIMA to LAD, SVG to circumflex, SVG to PDA  . Aortic valve replacement  01/26/2009    [facility] Ease bioprosthetic 23mm valve  . Transthoracic echocardiogram  09/2011    grade 1 diastolic dysfunction; increasing valve gradient; calcified MV annulus   . Cardiac catheterization  06/05/2013    native 3 vessel disease; patent LIMA  to LAD, SVG to OM and SVG to PDA; Elevated right and left heart filling pressures, with predominantly pulmonary venous hypertension, normal PVR  . Transthoracic echocardiogram  06/03/2013    EF 25-30%, mod conc. hypertrophy, grade 3 diastolic dysfunction; LA mod dilated; calcified MV annulus; transaortic gradients are normal for the bioprosthetic valve; inf vena cava dilated (elevated CVP) - LifeVest  . Left and right heart catheterization with coronary angiogram N/A 06/05/2013    Procedure: LEFT AND RIGHT HEART CATHETERIZATION WITH CORONARY ANGIOGRAM;  Surgeon: [name] Harding, MD;  Location: [facility] CATH LAB;  Service: Cardiovascular;  Laterality: N/A;  . Graft(s) angiogram  06/05/2013    Procedure: GRAFT(S) ANGIOGRAM;  Surgeon: [name] Harding, MD;  Location: [facility] CATH LAB;  Service: Cardiovascular;;    There were no vitals filed for this visit.  Visit Diagnosis:  Open toe wound, subsequent encounter  Unsteadiness  Difficulty walking                 Wound Therapy - 06/23/15 0957    Subjective Pt states his toe itches alot.  STates it's been bleeding since yesterday on the side of his big toe and had to change his socks a couple times.     Pain Assessment No/denies pain   Wound Properties Date First   Assessed: 04/22/15 Time First Assessed: 1020 Wound Type: Other (Comment) , open wound   Location: Toe (Comment  which one) , R great toe   Location Orientation: Right Present on Admission: Yes   Dressing Type Gauze (Comment);Silver dressings  acticoat   Dressing Changed Changed   Dressing Status Old drainage   Dressing Change Frequency Other (Comment)  2x/week   Site / Wound Assessment Granulation tissue;Yellow   % Wound base Red or Granulating 90%   % Wound base Yellow 10%   % Wound base Black 0%   Peri-wound Assessment Edema   Wound Length (cm) 0.5 cm  was 0.7cm   Wound Width (cm) 1.5 cm  was 2.5 cm   Wound Depth (cm) 0.1 cm  was 0.4 cm   Margins Attached edges  (approximated)   Closure None   Drainage Amount Scant   Drainage Description Serous   Treatment Cleansed;Debridement (Selective)   Wound Properties Date First Assessed: 06/23/15 Time First Assessed: 0900 Wound Type: Other (Comment) Location: Toe (Comment  which one) Location Orientation: Right Wound Description (Comments): Lateral aspect of Rt great toe Present on Admission: No   Dressing Type Impregnated gauze (bismuth)   Dressing Changed New   Dressing Status Intact   Dressing Change Frequency Other (Comment)   Site / Wound Assessment Red   % Wound base Red or Granulating 0%   Peri-wound Assessment Erythema (blanchable);Edema   Wound Length (cm) 2 cm   Wound Width (cm) 1 cm   Wound Depth (cm) 0 cm   Margins Attached edges (approximated)   Drainage Amount Scant   Drainage Description Serosanguineous   Treatment Cleansed;Debridement (Selective)   Incision Properties Date First Assessed: 04/23/15 Time First Assessed: 1738 Location: Groin Location Orientation: Left Present on Admission: No   Selective Debridement - Location R great toe    Selective Debridement - Tools Used Forceps;Scalpel;Scissors   Selective Debridement - Tissue Removed slough, callous edges, maceration tissues   Wound Therapy - Clinical Statement Re-eval completed today.  Original wound on tip of Rt great toe is healing well with increased approximation and overall reduction in size.  Lateral side toe with new area measuring 2X1X0 cm.  Appears to have been a blister originally.  Removed all dead skin from outer area, cleansed and dressed with xeroform to promote closure.  Also noted medial side of toe with increased redness and hardness from pressure of dressing. Urged patient to return to MD to get antibiotics as great toe with increased erythema today.  Marked borders of redness with an ink pen to track increase.  PT verbalized understanding and reported he would go to MD office.    Wound Therapy - Functional Problem List  non-healing wound complicated with DM and vascular compromise.    Factors Delaying/Impairing Wound Healing Altered sensation;Diabetes Mellitus;Infection - systemic/local;Multiple medical problems;Vascular compromise   Wound Therapy - Frequency Other (comment)   Wound Therapy - Current Recommendations Diabetic teaching;PT   Wound Plan F/U with MD visit and antibiotics for Rt great toe.  Continue with current PT POC with approximate dressings X 4 more weeks.   Dressing  silver and xeroform,  vaseline to perimeter, 2x2 and gauze   Decrease Necrotic Tissue to 20%   Decrease Necrotic Tissue - Progress Partly met   Increase Granulation Tissue to 80%   Increase Granulation Tissue - Progress Partly met   Decrease Length/Width/Depth by (cm) 1.0   Decrease Length/Width/Depth - Progress Partly met   Patient/Family will be able to  independently dress and inspect wound for signs of infection   Patient/Family Instruction Goal - Progress Progressing toward goal   Additional Wound Therapy Goal Full wound healing    Additional Wound Therapy Goal - Progress Progressing toward goal                               G-Codes - 06/23/15 1355    Functional Assessment Tool Used Based on skilled clinical assessment of wound healing status, gait/mobility    Functional Limitation Other PT primary   Other PT Primary Current Status (G8990) At least 40 percent but less than 60 percent impaired, limited or restricted   Other PT Primary Goal Status (G8991) At least 60 percent but less than 80 percent impaired, limited or restricted       Problem List Patient Active Problem List   Diagnosis Date Noted  . PVD (peripheral vascular disease) ([facility])   . S/P angioplasty 04/23/2015  . Peripheral arterial occlusive disease ([facility]) 04/23/2015  . PAD (peripheral artery disease) ([facility])   . Critical lower limb ischemia   . Toe ulcer, right ([facility])   . Type II or unspecified type diabetes mellitus without  mention of complication, uncontrolled 11/27/2013  . Acute respiratory failure with hypoxia ([facility]) 11/26/2013  . CAP (community acquired pneumonia) 11/26/2013  . CHF exacerbation ([facility]) 11/25/2013  . Cardiomyopathy, ischemic 06/13/2013  . At risk for sudden cardiac death 06/07/2013  . S/P cardiac catheterization, Rt & Lt heart cath 06/05/2013 06/07/2013  . Acute on chronic combined systolic and diastolic HF (heart failure), NYHA class 3 ([facility]) 06/04/2013  . Atrial flutter with rapid ventricular response ([facility]) 06/02/2013  . Acute renal failure: Cr up to 1.8 06/02/2013  . Hyperkalemia 06/02/2013    Class: Acute  . LBBB (left bundle branch block) 06/01/2013  . Hypotension- secondary to AF and Diltiazem Rx 06/01/2013  . Obesity (BMI 30-39.9) 06/01/2013  . Acute on chronic diastolic heart failure - due to Afib AVR 06/01/2013  . Atrial fibrillation with RVR- converted with Amiodarone 05/31/2013  . Stroke- Lt brain 2003 04/08/2013  . Hemiplegia affecting right dominant side ([facility]) 04/08/2013  . Cataract 04/08/2013  . GERD (gastroesophageal reflux disease) 04/08/2013  . S/P CABG x 3 - 2010 04/08/2013  . S/P AVR-tissue, 2010 04/08/2013  . DM2 (diabetes mellitus, type 2) (H[facility]) 04/08/2013  . Dyslipidemia 04/08/2013  . SUBACROMIAL BURSITIS, LEFT 11/05/2009    Amy B Frazier, PTA/CLT [phone] 06/23/2015, 1:56 PM Cynthia Russell, PT CLT [phone] [facility] [address] [place], [place], [address] Phone: [phone]   Fax:  [phone]  Name: [name] MRN: [id number] Date of Birth: 05/21/1946 Physical Therapy Progress Note  Dates of Reporting Period:04/22/2015 to 06/23/2015  Objective Reports of Subjective Statement: No pain  Objective Measurements: see above   Goal Update: see above  Plan: Continue skilled therapy for 4 more weeks  Reason Skilled Services are Required: Pt is a diabetic with compromised vascular sx and is at risk  for infection.       

## 2015-06-25 ENCOUNTER — Telehealth (HOSPITAL_COMMUNITY): Payer: Self-pay

## 2015-06-25 ENCOUNTER — Ambulatory Visit (HOSPITAL_COMMUNITY): Payer: Medicare Other

## 2015-06-25 DIAGNOSIS — S91109D Unspecified open wound of unspecified toe(s) without damage to nail, subsequent encounter: Secondary | ICD-10-CM | POA: Diagnosis not present

## 2015-06-25 DIAGNOSIS — R262 Difficulty in walking, not elsewhere classified: Secondary | ICD-10-CM

## 2015-06-25 DIAGNOSIS — R2681 Unsteadiness on feet: Secondary | ICD-10-CM | POA: Diagnosis not present

## 2015-06-25 NOTE — Therapy (Signed)
[place], [name], [address] Phone: [phone]   Fax:  [phone]  Wound Care Therapy  Patient Details  Name: [name] MRN: [id number] Date of Birth: 13-Sep-1945 No Data Recorded  Encounter Date: 06/25/2015      PT End of Session - 06/25/15 0942    Visit Number 21   Number of Visits 30   Date for PT Re-Evaluation 07/21/15   Authorization Type [employer] Medicare    Authorization Time Period 04/22/15 to 06/21/05   Authorization - Visit Number 21   Authorization - Number of Visits 30   PT Start Time 0850   PT Stop Time 0928   PT Time Calculation (min) 38 min   Activity Tolerance Patient tolerated treatment well   Behavior During Therapy [facility] for tasks assessed/performed      Past Medical History  Diagnosis Date  . Hypertension   . Diabetes mellitus     type 2   . History of valve replacement 2010    [facility] Ease bioprosthetic 74mm  . Thyroid disease   . Stroke 2003    R-sided weakness & upper extremity swelling   . Coronary artery disease   . Dyslipidemia   . At risk for sudden cardiac death 06/09/2013  . PAF (paroxysmal atrial fibrillation) 05/2013    converted with amiodarone to SR  . Chronic anticoagulation 05/2013    started  . S/P cardiac catheterization, Rt & Lt heart cath 06/05/2013 06-09-13  . S/P CABG x 3 2010  . GERD (gastroesophageal reflux disease)   . Cataract   . Chronic kidney disease     Patient reports he is seeing Kidney doctor in September    Past Surgical History  Procedure Laterality Date  . Coronary artery bypass graft  01/26/2009    LIMA to LAD, SVG to circumflex, SVG to PDA  . Aortic valve replacement  01/26/2009    [facility] Ease bioprosthetic 42mm valve  . Transthoracic echocardiogram  09/2011    grade 1 diastolic dysfunction; increasing valve gradient; calcified MV annulus   . Cardiac catheterization  06/05/2013    native 3 vessel disease; patent  LIMA to LAD, SVG to OM and SVG to PDA; Elevated right and left heart filling pressures, with predominantly pulmonary venous hypertension, normal PVR  . Transthoracic echocardiogram  06/03/2013    EF 25-30%, mod conc. hypertrophy, grade 3 diastolic dysfunction; LA mod dilated; calcified MV annulus; transaortic gradients are normal for the bioprosthetic valve; inf vena cava dilated (elevated CVP) - LifeVest  . Left and right heart catheterization with coronary angiogram N/A 06/05/2013    Procedure: LEFT AND RIGHT HEART CATHETERIZATION WITH CORONARY ANGIOGRAM;  Surgeon: Leonie Man, MD;  Location: [facility] CATH LAB;  Service: Cardiovascular;  Laterality: N/A;  . Graft(s) angiogram  06/05/2013    Procedure: GRAFT(S) Cyril Loosen;  Surgeon: Leonie Man, MD;  Location: [facility] CATH LAB;  Service: Cardiovascular;;    There were no vitals filed for this visit.  Visit Diagnosis:  Open toe wound, subsequent encounter  Difficulty walking  Unsteadiness      Subjective Assessment - 06/25/15 1401    Subjective No pain today, dressings intact.  Pt stated he called MD office following apt on Tuesday, has not been prescrbed antibiotics yet.   Currently in Pain? No/denies             Wound Therapy - 06/25/15 1403    Subjective No pain today, dressings intact.  Pt stated he called MD office following apt on Tuesday, has not been prescrbed antibiotics yet.   Pain Assessment No/denies pain   Wound Properties Date First Assessed: 06/23/15 Time First Assessed: 0900 Wound Type: Other (Comment) Location: Toe (Comment  which one) Location Orientation: Right Wound Description (Comments): Lateral aspect of Rt great toe Present on Admission: No   Dressing Type Impregnated gauze (bismuth)   Dressing Changed New   Dressing Status Intact   Dressing Change Frequency Other (Comment)   Site / Wound Assessment Red   % Wound base Red or Granulating 45%   % Wound base Yellow 55%   Peri-wound Assessment Erythema  (blanchable);Edema   Margins Attached edges (approximated)   Drainage Amount Scant   Drainage Description Serosanguineous   Treatment Cleansed;Debridement (Selective)   Wound Properties Date First Assessed: 04/22/15 Time First Assessed: 1020 Wound Type: Other (Comment) , open wound   Location: Toe (Comment  which one) , R great toe   Location Orientation: Right Present on Admission: Yes   Dressing Type Gauze (Comment);Silver dressings   Dressing Changed Changed   Dressing Status Old drainage   Dressing Change Frequency Other (Comment)  2x week   Site / Wound Assessment Granulation tissue;Yellow   % Wound base Red or Granulating 95%   % Wound base Yellow 5%   % Wound base Black 0%   Peri-wound Assessment Edema   Margins Attached edges (approximated)   Closure None   Drainage Amount Scant   Drainage Description Serous   Treatment Cleansed;Debridement (Selective)   Incision Properties Date First Assessed: 04/23/15 Time First Assessed: 1738 Location: Groin Location Orientation: Left Present on Admission: No   Selective Debridement - Location R great toe    Selective Debridement - Tools Used Forceps;Scalpel   Selective Debridement - Tissue Removed slough, callous edges, maceration tissues   Wound Therapy - Clinical Statement Redness continues surrounding wound Rt toe, not outside trace marks made last session.  Called MD office and requested antibiotics prescribed for infection control.  Distal wound improving approximation with minimal debridement necessary for removal of slough and callous to promote healing.  Continued with silver dressings to distal toe and xeroform on lateral aspect of toe.  Vaseline perimeter of wounds and gauze dressings.  No reports of pain through session, MD should call in prescription to pharmacy.   Wound Therapy - Functional Problem List non-healing wound complicated with DM and vascular compromise.    Factors Delaying/Impairing Wound Healing Altered  sensation;Diabetes Mellitus;Infection - systemic/local;Multiple medical problems;Vascular compromise   Wound Therapy - Frequency Other (comment)   Wound Therapy - Current Recommendations Diabetic teaching;PT   Wound Plan F/U with MD visit and antibiotics for Rt great toe.  Continue with current PT POC with approximate dressings X 4 more weeks.   Dressing  silver and xeroform,  vaseline to perimeter, 2x2 and gauze        Problem List Patient Active Problem List   Diagnosis Date Noted  . PVD (peripheral vascular disease) ([place])   . S/P angioplasty 04/23/2015  . Peripheral arterial occlusive disease ([place]) 04/23/2015  . PAD (peripheral artery disease) ([place])   . Critical lower limb ischemia   . Toe ulcer, right ([place])   . Type II or unspecified type diabetes mellitus without mention of complication, uncontrolled 11/27/2013  . Acute respiratory failure with hypoxia ([place]) 11/26/2013  . CAP (community acquired pneumonia) 11/26/2013  . CHF exacerbation ([place]) 11/25/2013  . Cardiomyopathy, ischemic 06/13/2013  . At risk for sudden  cardiac death 06/07/2013  . S/P cardiac catheterization, Rt & Lt heart cath 06/05/2013 06/07/2013  . Acute on chronic combined systolic and diastolic HF (heart failure), NYHA class 3 ([place]) 06/04/2013  . Atrial flutter with rapid ventricular response ([place]) 06/02/2013  . Acute renal failure: Cr up to 1.8 06/02/2013  . Hyperkalemia 06/02/2013    Class: Acute  . LBBB (left bundle branch block) 06/01/2013  . Hypotension- secondary to AF and Diltiazem Rx 06/01/2013  . Obesity (BMI 30-39.9) 06/01/2013  . Acute on chronic diastolic heart failure - due to Afib AVR 06/01/2013  . Atrial fibrillation with RVR- converted with Amiodarone 05/31/2013  . Stroke- Lt brain 2003 04/08/2013  . Hemiplegia affecting right dominant side ([place]) 04/08/2013  . Cataract 04/08/2013  . GERD (gastroesophageal reflux disease) 04/08/2013  . S/P CABG x 3 - 2010 04/08/2013  . S/P AVR-tissue, 2010  04/08/2013  . DM2 (diabetes mellitus, type 2) ([place]) 04/08/2013  . Dyslipidemia 04/08/2013  . SUBACROMIAL BURSITIS, LEFT 11/05/2009   Ihor Austin, LPTA; CBIS [phone]  Aldona Lento 06/25/2015, 2:12 PM  [place], [name], [address] Phone: [phone]   Fax:  [phone]  Name: [name] MRN: [id number] Date of Birth: Nov 20, 1945

## 2015-06-26 ENCOUNTER — Other Ambulatory Visit: Payer: Self-pay | Admitting: Internal Medicine

## 2015-06-29 ENCOUNTER — Ambulatory Visit (INDEPENDENT_AMBULATORY_CARE_PROVIDER_SITE_OTHER): Payer: Medicare Other | Admitting: Internal Medicine

## 2015-06-29 ENCOUNTER — Encounter: Payer: Self-pay | Admitting: Internal Medicine

## 2015-06-29 ENCOUNTER — Other Ambulatory Visit: Payer: Self-pay

## 2015-06-29 VITALS — BP 128/74 | HR 78 | Ht 70.0 in | Wt 254.4 lb

## 2015-06-29 DIAGNOSIS — Z954 Presence of other heart-valve replacement: Secondary | ICD-10-CM

## 2015-06-29 DIAGNOSIS — R011 Cardiac murmur, unspecified: Secondary | ICD-10-CM

## 2015-06-29 DIAGNOSIS — Z952 Presence of prosthetic heart valve: Secondary | ICD-10-CM

## 2015-06-29 DIAGNOSIS — I447 Left bundle-branch block, unspecified: Secondary | ICD-10-CM

## 2015-06-29 DIAGNOSIS — Z79899 Other long term (current) drug therapy: Secondary | ICD-10-CM

## 2015-06-29 DIAGNOSIS — Z951 Presence of aortocoronary bypass graft: Secondary | ICD-10-CM | POA: Diagnosis not present

## 2015-06-29 DIAGNOSIS — K921 Melena: Secondary | ICD-10-CM

## 2015-06-29 DIAGNOSIS — G8191 Hemiplegia, unspecified affecting right dominant side: Secondary | ICD-10-CM

## 2015-06-29 DIAGNOSIS — E785 Hyperlipidemia, unspecified: Secondary | ICD-10-CM | POA: Diagnosis not present

## 2015-06-29 DIAGNOSIS — I255 Ischemic cardiomyopathy: Secondary | ICD-10-CM

## 2015-06-29 DIAGNOSIS — I4891 Unspecified atrial fibrillation: Secondary | ICD-10-CM

## 2015-06-29 DIAGNOSIS — I739 Peripheral vascular disease, unspecified: Secondary | ICD-10-CM

## 2015-06-29 LAB — COMPREHENSIVE METABOLIC PANEL
ALBUMIN: 4.2 g/dL (ref 3.6–5.1)
ALT: 50 U/L — ABNORMAL HIGH (ref 9–46)
AST: 32 U/L (ref 10–35)
Alkaline Phosphatase: 50 U/L (ref 40–115)
BILIRUBIN TOTAL: 0.4 mg/dL (ref 0.2–1.2)
BUN: 23 mg/dL (ref 7–25)
CALCIUM: 9.7 mg/dL (ref 8.6–10.3)
CHLORIDE: 100 mmol/L (ref 98–110)
CO2: 28 mmol/L (ref 20–31)
Creat: 1.38 mg/dL — ABNORMAL HIGH (ref 0.70–1.25)
Glucose, Bld: 192 mg/dL — ABNORMAL HIGH (ref 65–99)
Potassium: 4.3 mmol/L (ref 3.5–5.3)
Sodium: 141 mmol/L (ref 135–146)
TOTAL PROTEIN: 6.6 g/dL (ref 6.1–8.1)

## 2015-06-29 LAB — LIPID PANEL
CHOLESTEROL: 172 mg/dL (ref 125–200)
HDL: 24 mg/dL — ABNORMAL LOW (ref >=40)
LDL Cholesterol: 87 mg/dL (ref <130)
TRIGLYCERIDES: 303 mg/dL — AB (ref <150)
Total CHOL/HDL Ratio: 7.2 Ratio — ABNORMAL HIGH (ref <=5.0)
VLDL: 61 mg/dL — AB (ref <30)

## 2015-06-29 LAB — TSH: TSH: 1.152 u[IU]/mL (ref 0.350–4.500)

## 2015-06-29 MED ORDER — TORSEMIDE 10 MG PO TABS: 10.0000 mg | ORAL_TABLET | Freq: Every day | ORAL | Status: DC

## 2015-06-29 NOTE — Progress Notes (Signed)
OFFICE NOTE  Chief Complaint:  Routine follow-up, no complaints  Primary Care Physician: [name]., MD  HPI:  [name] a pleasant 69 year old gentleman with history of coronary artery disease status post CABG x3 vessels with Dr. Prescott Gum with a LIMA to the LAD, SVG to circumflex and SVG to PDA. He also had an [facility] Ease bioprosthetic 23-mm valve placed at that time in 2010. He is seen today in followup from the hospital for TCM 7 due to the high risk of admission.  He did receive a phone call after discharge within 24 hours which found him well, continuing to urinate excessively with ongoing weight loss and improved dyspnea.  His history goes back to 2003, unfortunately,when he had a stroke which has caused much difficulty walking as well as some heaviness and some swelling in the right upper extremity particularly. Unfortunately, he has not been able to lose weight. He is active walking but does not necessarily do any exercise. In February 2013 he underwent another echocardiogram to look at his valve gradient. It was increased significantly compared to his prior study. The peak gradient was 26, mean gradient of 14. However, he was not complaining of any worsening dyspnea or anything associated with that. He is describing some symptoms which are concerning for worsening acid reflux including belching and increased heartburn. At his last office visit I increased his protonix to twice daily. This is improved his reflux some, but he still has symptoms associated with some belching and/or flatus which improves. Recently he was admitted to the hospital for atrial fibrillation with rapid ventricular response which was noted when he showed up at his doctor's office after having 1-2 loose stools but no bleeding, no abdominal pain. He did have a couple of episodes of dry heaves and 1-2 episodes of small volume non-bloody vomiting. He reported no significant change with his po  intake however. Over the last day his bowel habits have returned to baseline and he has not had any further nausea or vomiting. He saw his PCP's office 05/31/13 due to SOB and palpitations and after an ECG showed tachyarrhythmia they called 911 and an ambulance brought him to the [facility] ED. On arrival his ECG showed a wide-complex rhythm at 157 bpm and telemetry showed irregularly irregular rhythm. Historical ECG's show NSR with LBBB pattern thus the LBBB was not new. In the ED, physician staff concluded that he had new onset AF and started a Diltiazem infusion with some improvement in the HR but significant irregularity and variance in his HR continued. IV amiodarone was started and coreg changed to lopressor. ACE was held. Lasix given. He eventually changed to atrial flutter and then spontaneously converted to sinus rhythm. Pt hyperkalemic and kayexalate given. Plan for anticoagulation after rt and lt heart cath to eval for ischemic cause of atrial fib. EF by echo no 25-30%. He also has grade 3 diastolic dysfunction.   Pt continued to improve and underwent cardiac cath:   Impression:  1. Native 3 vessel CAD.  2. Patent LIMA to LAD, SVG to OM and SVG to PDA - TIMI III flow  3. Elevated right and left heart filling pressures, with predominantly pulmonary venous hypertension, normal PVR  4. Mildly reduced cardiac output and index   He returns feeling well today. He has had stable weights at home. Fortunately had a recent echo on 09/09/2013 which shows an improved EF of 45-50%, which was previously 30%. There is still  dietary noncompliance and blood sugars are not well controlled ranging in the 200s in the morning.  I saw [name] back today in the office. He's been incredibly stable. In fact his weight is only within a few pounds of his prior weight. He denies any worsening shortness of breath or chest pain. His last echocardiogram showed an EF of 45-50% in January 2015. He seems to be able to do  most activities despite a hemiplegia from prior stroke. He is requesting refills of his torsemide today. Only other news is that he had some poor healing of a toe ulcer. He is going to the wound care center for management of this and hopefully can avoid amputation. Does have significant PAD, although he denies claudication. Finally, he is on L a question is noted some blood in the stool when wiping after bowel movements. This could be due to hemorrhoids but a GI evaluation is warranted.  PMHx:  Past Medical History  Diagnosis Date  . Hypertension   . Diabetes mellitus     type 2   . History of valve replacement 2010    [facility] Ease bioprosthetic 56mm  . Thyroid disease   . Stroke [name]) 2003    R-sided weakness & upper extremity swelling   . Coronary artery disease   . Dyslipidemia   . At risk for sudden cardiac death 2013-06-19  . PAF (paroxysmal atrial fibrillation) ([place]) 05/2013    converted with amiodarone to SR  . Chronic anticoagulation 05/2013    started  . S/P cardiac catheterization, Rt & Lt heart cath 06/05/2013 06/19/13  . S/P CABG x 3 2010  . GERD (gastroesophageal reflux disease)   . Cataract   . Chronic kidney disease     Patient reports he is seeing Kidney doctor in September    Past Surgical History  Procedure Laterality Date  . Coronary artery bypass graft  01/26/2009    LIMA to LAD, SVG to circumflex, SVG to PDA  . Aortic valve replacement  01/26/2009    [facility] Ease bioprosthetic 86mm valve  . Transthoracic echocardiogram  09/2011    grade 1 diastolic dysfunction; increasing valve gradient; calcified MV annulus   . Cardiac catheterization  06/05/2013    native 3 vessel disease; patent LIMA to LAD, SVG to OM and SVG to PDA; Elevated right and left heart filling pressures, with predominantly pulmonary venous hypertension, normal PVR  . Transthoracic echocardiogram  06/03/2013    EF 25-30%, mod conc. hypertrophy, grade 3 diastolic dysfunction; LA mod  dilated; calcified MV annulus; transaortic gradients are normal for the bioprosthetic valve; inf vena cava dilated (elevated CVP) - LifeVest  . Left and right heart catheterization with coronary angiogram N/A 06/05/2013    Procedure: LEFT AND RIGHT HEART CATHETERIZATION WITH CORONARY ANGIOGRAM;  Surgeon: Leonie Man, MD;  Location: [facility] CATH LAB;  Service: Cardiovascular;  Laterality: N/A;  . Graft(s) angiogram  06/05/2013    Procedure: GRAFT(S) Cyril Loosen;  Surgeon: Leonie Man, MD;  Location: [facility] CATH LAB;  Service: Cardiovascular;;    FAMHx:  Family History  Problem Relation Age of Onset  . Heart attack Mother   . Liver disease Brother   . Diabetes Sister     x4    SOCHx:   reports that he quit smoking about 13 years ago. His smoking use included Cigarettes. He has quit using smokeless tobacco. His smokeless tobacco use included Chew. He reports that he does not drink alcohol or use illicit drugs.  ALLERGIES:  No Known Allergies  ROS: A comprehensive review of systems was negative except for: Musculoskeletal: positive for Poorly healing toe ulcer  HOME MEDS: Current Outpatient Prescriptions  Medication Sig Dispense Refill  . acetaminophen (TYLENOL) 500 MG tablet Take 1,000 mg by mouth every 6 (six) hours as needed. For pain/headaches     . ALPRAZolam (XANAX) 1 MG tablet Take 1 mg by mouth 4 (four) times daily as needed for anxiety.     Marland Kitchen amiodarone (PACERONE) 200 MG tablet Take 1 tablet (200 mg total) by mouth daily. 90 tablet 1  . apixaban (ELIQUIS) 5 MG TABS tablet Take 1 tablet (5 mg total) by mouth 2 (two) times daily. 180 tablet 1  . atorvastatin (LIPITOR) 20 MG tablet Take 1 tablet (20 mg total) by mouth daily at 6 PM. 90 tablet 1  . bismuth subsalicylate (PEPTO BISMOL) 262 MG/15ML suspension Take 30 mLs by mouth every 6 (six) hours as needed for indigestion.    . celecoxib (CELEBREX) 200 MG capsule Take 1 capsule by mouth 2 (two) times daily.    . clopidogrel (PLAVIX)  75 MG tablet Take 1 tablet (75 mg total) by mouth daily. 30 tablet 3  . docusate sodium (COLACE) 100 MG capsule Take 100 mg by mouth 2 (two) times daily.      Marland Kitchen doxycycline (VIBRA-TABS) 100 MG tablet Take 100 mg by mouth 2 (two) times daily.    . fenofibrate (TRICOR) 145 MG tablet Take 1 tablet (145 mg total) by mouth daily. (Patient not taking: Reported on 06/29/2015) 90 tablet 1  . fenofibrate (TRICOR) 145 MG tablet Take 1 tablet (145 mg total) by mouth daily. 30 tablet 0  . ferrous sulfate 325 (65 FE) MG EC tablet Take 325 mg by mouth every morning.    . gabapentin (NEURONTIN) 300 MG capsule Take 300 mg by mouth 2 (two) times daily.     Marland Kitchen HYDROcodone-acetaminophen (NORCO) 10-325 MG per tablet Take 1 tablet by mouth as needed for severe pain.     Marland Kitchen insulin glargine (LANTUS) 100 UNIT/ML injection Inject 60-70 Units into the skin 2 (two) times daily. Takes70 units in the morning and 60 units in the evening    . insulin lispro (HUMALOG) 100 UNIT/ML injection Inject 50-60 Units into the skin 3 (three) times daily before meals. 50U BID and 60U QHS    . levothyroxine (SYNTHROID, LEVOTHROID) 25 MCG tablet Take 25 mcg by mouth daily.      . metoprolol succinate (TOPROL-XL) 25 MG 24 hr tablet Take 1 tablet (25 mg total) by mouth daily. 90 tablet 1  . Omega-3 Fatty Acids (FISH OIL PO) Take 1 capsule by mouth 2 (two) times daily.     . pantoprazole (PROTONIX) 40 MG tablet Take 1 tablet (40 mg total) by mouth 2 (two) times daily. 180 tablet 1  . silver sulfADIAZINE (SILVADENE) 1 % cream Apply 1 application topically 4 (four) times daily.    Marland Kitchen torsemide (DEMADEX) 10 MG tablet Take 1 tablet (10 mg total) by mouth daily. 30 tablet 11   No current facility-administered medications for this visit.    LABS/IMAGING: No results found for this or any previous visit (from the past 48 hour(s)). No results found.  VITALS: BP 128/74 mmHg  Pulse 78  Ht 5\' 10"  (1.778 m)  Wt 254 lb 7 oz (115.412 kg)  BMI 36.51  kg/m2  EXAM: General appearance: alert and no distress, wearing lifevest Neck: no carotid bruit, no JVD Lungs: clear  to auscultation bilaterally Heart: regular rate and rhythm, S1, S2 normal, 2/6 SEM at RUSB, no click, rub or gallop Abdomen: soft, non-tender; bowel sounds normal; no masses,  no organomegaly and obese Extremities: extremities normal, atraumatic, no cyanosis or edema Pulses: 2+ and symmetric Skin: Skin color, texture, turgor normal. No rashes or lesions Neurologic: Grossly normal  EKG: Sinus rhythm at 78, left bundle branch block, first degree AV block  ASSESSMENT: 1. Coronary artery disease status post three-vessel CABG in 2010 2. Status post bioprosthetic aVR in 2010, [facility] MagnaEase valve- 23 mm, gradients of 26/14 mmHg 3. GERD - on protoix 4. Dyslipidemia 5. Diabetes type 2 6. History of stroke with right-sided hemiparesis - complaining of numbness and tingling in his 4th/5th digits on the right hand 7. Atrial fibrillation with rapid ventricular response-spontaneously converted with amiodarone 8. Acute on chronic systolic congestive heart failure-EF now 45-50%, NYHA Class II symptoms 9. Left bundle-branch block-QRS duration 166 msec  PLAN: 1.   Mr. Stowell is doing much better. In fact, he's had no further heart failure exacerbations or admissions over this past year. He is due for repeat echocardiogram to reassess LV function. Weight has been stable within 1-2 pounds and he continues to urinate well with torsemide. We will refill that for him today. There no signs of decompensated heart failure today. He continues to take Eliquis, but has reported some blood when he wipes after bowel movements. This could be due to hemorrhoids. I like to refer him to Dr. Gala Romney in [place] for evaluation.  We will continue his current medications today and plan to see him back annually.  Pixie Casino, MD, [facility] Attending Cardiologist [facility]  Pixie Casino 06/29/2015, 1:39 PM

## 2015-06-29 NOTE — Patient Instructions (Addendum)
Your physician recommends that you return for lab work TODAY >> first first >> first hallway on right off elevator >> down hall - turn left >> [address] on left  Torsemide has been refilled.   Your physician has requested that you have an echocardiogram @ [facility]. Echocardiography is a painless test that uses sound waves to create images of your heart. It provides your doctor with information about the size and shape of your heart and how well your heart's chambers and valves are working. This procedure takes approximately one hour. There are no restrictions for this procedure.  Your physician wants you to follow-up in: 1 year with Dr. Debara Pickett. You will receive a reminder letter in the mail two months in advance. If you don't receive a letter, please call our office to schedule the follow-up appointment.  You have been referred to Dr. Gala Romney - gastroenterologist in [place]

## 2015-06-29 NOTE — Patient Outreach (Addendum)
[facility]) Care Management  [place]  06/29/2015   [name] 27-Sep-1945 [id number]  Subjective: Initial Health Coach Assessment with patient.  Patient states he is doing good.  Saw Dr. Debara Pickett today and he states visit went well. Next visit in one year.  Patient expresses that his heat is not working and he does not have the money to get it fixed. Discussed with patient having social worker contact him about his heat.  Patient in agreement.    Diabetes: Patient continues to have slow healing wound to right great toe.  He states he continues to go to the wound center for treatment.  Discussed diet and blood sugar control to aid in healing of foot wound.  He verbalized understanding.  Patient reports goal for his sugars is less than 200.  Patient unsure of last A1c.    Objective:   Current Medications:  Current Outpatient Prescriptions  Medication Sig Dispense Refill  . acetaminophen (TYLENOL) 500 MG tablet Take 1,000 mg by mouth every 6 (six) hours as needed. For pain/headaches     . ALPRAZolam (XANAX) 1 MG tablet Take 1 mg by mouth 4 (four) times daily as needed for anxiety.     Marland Kitchen amiodarone (PACERONE) 200 MG tablet Take 1 tablet (200 mg total) by mouth daily. 90 tablet 1  . apixaban (ELIQUIS) 5 MG TABS tablet Take 1 tablet (5 mg total) by mouth 2 (two) times daily. 180 tablet 1  . atorvastatin (LIPITOR) 20 MG tablet Take 1 tablet (20 mg total) by mouth daily at 6 PM. 90 tablet 1  . bismuth subsalicylate (PEPTO BISMOL) 262 MG/15ML suspension Take 30 mLs by mouth every 6 (six) hours as needed for indigestion.    . celecoxib (CELEBREX) 200 MG capsule Take 1 capsule by mouth 2 (two) times daily.    . clopidogrel (PLAVIX) 75 MG tablet Take 1 tablet (75 mg total) by mouth daily. 30 tablet 3  . docusate sodium (COLACE) 100 MG capsule Take 100 mg by mouth 2 (two) times daily.      Marland Kitchen doxycycline (VIBRA-TABS) 100 MG tablet Take 100 mg by mouth 2 (two) times daily.     . fenofibrate (TRICOR) 145 MG tablet Take 1 tablet (145 mg total) by mouth daily. 30 tablet 0  . ferrous sulfate 325 (65 FE) MG EC tablet Take 325 mg by mouth every morning.    . gabapentin (NEURONTIN) 300 MG capsule Take 300 mg by mouth 2 (two) times daily.     Marland Kitchen HYDROcodone-acetaminophen (NORCO) 10-325 MG per tablet Take 1 tablet by mouth as needed for severe pain.     Marland Kitchen insulin glargine (LANTUS) 100 UNIT/ML injection Inject 60-70 Units into the skin 2 (two) times daily. Takes70 units in the morning and 60 units in the evening    . insulin lispro (HUMALOG) 100 UNIT/ML injection Inject 50-60 Units into the skin 3 (three) times daily before meals. 50U BID and 60U QHS    . levothyroxine (SYNTHROID, LEVOTHROID) 25 MCG tablet Take 25 mcg by mouth daily.      . metoprolol succinate (TOPROL-XL) 25 MG 24 hr tablet Take 1 tablet (25 mg total) by mouth daily. 90 tablet 1  . Omega-3 Fatty Acids (FISH OIL PO) Take 1 capsule by mouth 2 (two) times daily.     . pantoprazole (PROTONIX) 40 MG tablet Take 1 tablet (40 mg total) by mouth 2 (two) times daily. 180 tablet 1  . silver sulfADIAZINE (SILVADENE) 1 %  cream Apply 1 application topically 4 (four) times daily.    Marland Kitchen torsemide (DEMADEX) 10 MG tablet Take 1 tablet (10 mg total) by mouth daily. 30 tablet 11  . fenofibrate (TRICOR) 145 MG tablet Take 1 tablet (145 mg total) by mouth daily. (Patient not taking: Reported on 06/29/2015) 90 tablet 1   No current facility-administered medications for this visit.    Functional Status:  In your present state of health, do you have any difficulty performing the following activities: 06/29/2015 06/04/2015  Hearing? N N  Vision? N -  Difficulty concentrating or making decisions? N Y  Walking or climbing stairs? N N  Dressing or bathing? N N  Doing errands, shopping? N N  Preparing Food and eating ? N N  Using the Toilet? N N  In the past six months, have you accidently leaked urine? N N  Do you have problems  with loss of bowel control? N N  Managing your Medications? N N  Managing your Finances? N N  Housekeeping or managing your Housekeeping? [name]    Fall/Depression Screening: PHQ 2/9 Scores 06/29/2015 06/04/2015 04/29/2015 02/18/2015 02/11/2015  PHQ - 2 Score 0 1 1 2 2   PHQ- 9 Score - - - 2 7    Assessment: Patient has some knowledge deficit in managing diabetes and complications of diabetes.   Patient will benefit from health coach outreach for disease management.   Plan:  [facility] CM Care Plan Problem Two        Most Recent Value   Care Plan Problem Two  Uncontrolled diabetes as evidenced by elevated CBGs, HGA1C and nonhealing wound.   Role Documenting the Problem Two  [name] for Problem Two  Active   Interventions for Problem Two Long Term Goal   RN Health Coach discussed with patient limiting carbohydrates and increase protein to aid in wound healing.     THN Long Term Goal (31-90) days  Patient stated goal "I want my foot to heal", Patient foot will show improvement to wound over next 90 days   THN Long Term Goal Start Date  06/03/15 [name] as patient continues to go to wound care center]   [facility] CM Short Term Goal #3 (0-30 days)  Patient CBG's will be under better control over next 30 days   THN CM Short Term Goal #3 Start Date  06/29/15 [goal continued]   Interventions for Short Term Goal #3  [name] named food high in carbohydrates and discussed importance of limiting carbohydrates.       RN Health Coach will send message to social worker for assistance with heat.  RN Health Coach will provide ongoing verbal education for patient on diabetes through phone calls.  RN Health Coach will welcome letter to patient.   RN Health Coach will send initial barriers letter, assessment, and care plan to primary care physician.  RN Health Coach will contact patient within one month and patient agrees to next contact.   Rickey Baseman, RN, MSN [place] [phone]

## 2015-06-30 NOTE — Telephone Encounter (Signed)
On 06/25/2015:  Called MD office for request for antibiotics for wound on big toe.  [address], [place]; CBIS [phone]

## 2015-07-01 ENCOUNTER — Ambulatory Visit (HOSPITAL_COMMUNITY): Payer: Medicare Other | Admitting: Physical Therapy

## 2015-07-01 DIAGNOSIS — S91109D Unspecified open wound of unspecified toe(s) without damage to nail, subsequent encounter: Secondary | ICD-10-CM | POA: Diagnosis not present

## 2015-07-01 DIAGNOSIS — R262 Difficulty in walking, not elsewhere classified: Secondary | ICD-10-CM | POA: Diagnosis not present

## 2015-07-01 DIAGNOSIS — R2681 Unsteadiness on feet: Secondary | ICD-10-CM | POA: Diagnosis not present

## 2015-07-01 NOTE — Addendum Note (Signed)
Addended by: Diana Eves on: 07/01/2015 03:40 PM   Modules accepted: Orders

## 2015-07-01 NOTE — Therapy (Signed)
[place], [name], [address] Phone: [phone]   Fax:  [phone]  Wound Care Therapy  Patient Details  Name: [name] MRN: [id number] Date of Birth: [birth year] No Data Recorded  Encounter Date: 07/01/2015      PT End of Session - 07/01/15 0954    Visit Number 22   Number of Visits 30   Date for PT Re-Evaluation 07/21/15   Authorization Type UHC Medicare    Authorization Time Period 04/22/15 to 06/21/05   Authorization - Visit Number 37   Authorization - Number of Visits 30   PT Start Time 0825   PT Stop Time 0850   PT Time Calculation (min) 25 min   Activity Tolerance Patient tolerated treatment well   Behavior During Therapy [facility] for tasks assessed/performed      Past Medical History  Diagnosis Date  . Hypertension   . Diabetes mellitus     type 2   . History of valve replacement 2010    [facility] Ease bioprosthetic 83mm  . Thyroid disease   . Stroke [facility]) 2003    R-sided weakness & upper extremity swelling   . Coronary artery disease   . Dyslipidemia   . At risk for sudden cardiac death 2013-06-17  . PAF (paroxysmal atrial fibrillation) ([place]) 05/2013    converted with amiodarone to SR  . Chronic anticoagulation 05/2013    started  . S/P cardiac catheterization, Rt & Lt heart cath 06/05/2013 17-Jun-2013  . S/P CABG x 3 2010  . GERD (gastroesophageal reflux disease)   . Cataract   . Chronic kidney disease     Patient reports he is seeing Kidney doctor in September    Past Surgical History  Procedure Laterality Date  . Coronary artery bypass graft  01/26/2009    LIMA to LAD, SVG to circumflex, SVG to PDA  . Aortic valve replacement  01/26/2009    [facility] Ease bioprosthetic 33mm valve  . Transthoracic echocardiogram  09/2011    grade 1 diastolic dysfunction; increasing valve gradient; calcified MV annulus   . Cardiac catheterization  06/05/2013    native 3 vessel  disease; patent LIMA to LAD, SVG to OM and SVG to PDA; Elevated right and left heart filling pressures, with predominantly pulmonary venous hypertension, normal PVR  . Transthoracic echocardiogram  06/03/2013    EF 25-30%, mod conc. hypertrophy, grade 3 diastolic dysfunction; LA mod dilated; calcified MV annulus; transaortic gradients are normal for the bioprosthetic valve; inf vena cava dilated (elevated CVP) - LifeVest  . Left and right heart catheterization with coronary angiogram N/A 06/05/2013    Procedure: LEFT AND RIGHT HEART CATHETERIZATION WITH CORONARY ANGIOGRAM;  Surgeon: Leonie Man, MD;  Location: [facility] CATH LAB;  Service: Cardiovascular;  Laterality: N/A;  . Graft(s) angiogram  06/05/2013    Procedure: GRAFT(S) Cyril Loosen;  Surgeon: Leonie Man, MD;  Location: [facility] CATH LAB;  Service: Cardiovascular;;    There were no vitals filed for this visit.  Visit Diagnosis:  Open toe wound, subsequent encounter  Difficulty walking  Unsteadiness                 Wound Therapy - 07/01/15 0949    Wound Properties Date First Assessed: 06/23/15 Time First Assessed: 0900 Wound Type: Other (Comment) Location: Toe (Comment  which one) Location Orientation: Right Wound Description (Comments): Lateral aspect of Rt great toe Present on Admission: No   Wound Properties Date First  Assessed: 04/22/15 Time First Assessed: 1020 Wound Type: Other (Comment) , open wound   Location: Toe (Comment  which one) , R great toe   Location Orientation: Right Present on Admission: Yes   Incision Properties Date First Assessed: 04/23/15 Time First Assessed: 1738 Location: Groin Location Orientation: Left Present on Admission: No   Selective Debridement - Location R great toe    Selective Debridement - Tools Used Forceps;Scalpel   Selective Debridement - Tissue Removed slough, callous edges, maceration tissues   Wound Therapy - Clinical Statement Overall improvment with complete closure of wound at tip  of great toe, however dry and scaly tissue surrounding.  Lateral aspect with increased granulation with only a little adherent slough remaining in central wound.  Overall improving.    Wound Plan Continue with current PT POC with approximate dressings X 3 more weeks.   Dressing  xeroform,  vaseline to perimeter, 2x2 and gauze                              Problem List Patient Active Problem List   Diagnosis Date Noted  . PVD (peripheral vascular disease) ([place])   . S/P angioplasty 04/23/2015  . Peripheral arterial occlusive disease ([place]) 04/23/2015  . PAD (peripheral artery disease) ([place])   . Critical lower limb ischemia   . Toe ulcer, right ([place])   . Type II or unspecified type diabetes mellitus without mention of complication, uncontrolled 11/27/2013  . Acute respiratory failure with hypoxia ([place]) 11/26/2013  . CAP (community acquired pneumonia) 11/26/2013  . CHF exacerbation ([place]) 11/25/2013  . Cardiomyopathy, ischemic 06/13/2013  . At risk for sudden cardiac death 09-Jun-2013  . S/P cardiac catheterization, Rt & Lt heart cath 06/05/2013 09-Jun-2013  . Acute on chronic combined systolic and diastolic HF (heart failure), NYHA class 3 ([place]) 06/04/2013  . Atrial flutter with rapid ventricular response ([place]) 06/02/2013  . Acute renal failure: Cr up to 1.8 06/02/2013  . Hyperkalemia 06/02/2013    Class: Acute  . LBBB (left bundle branch block) 06/01/2013  . Hypotension- secondary to AF and Diltiazem Rx 06/01/2013  . Obesity (BMI 30-39.9) 06/01/2013  . Acute on chronic diastolic heart failure - due to Afib AVR 06/01/2013  . Atrial fibrillation with RVR- converted with Amiodarone 05/31/2013  . Stroke- Lt brain 2003 04/08/2013  . Hemiplegia affecting right dominant side ([place]) 04/08/2013  . Cataract 04/08/2013  . GERD (gastroesophageal reflux disease) 04/08/2013  . S/P CABG x 3 - 2010 04/08/2013  . S/P AVR-tissue, 2010 04/08/2013  . DM2 (diabetes mellitus, type 2)  ([place]) 04/08/2013  . Dyslipidemia 04/08/2013  . SUBACROMIAL BURSITIS, LEFT 11/05/2009    Teena Irani, PTA/CLT [phone] 07/01/2015, 9:56 AM  [place] [address] [place], [name], [address] Phone: [phone]   Fax:  [phone]  Name: [name] MRN: [id number] Date of Birth: 11/22/1945

## 2015-07-02 ENCOUNTER — Encounter: Payer: Self-pay | Admitting: Internal Medicine

## 2015-07-02 ENCOUNTER — Ambulatory Visit (HOSPITAL_COMMUNITY): Admission: RE | Admit: 2015-07-02 | Payer: Medicare Other | Source: Ambulatory Visit

## 2015-07-03 ENCOUNTER — Ambulatory Visit (HOSPITAL_COMMUNITY): Payer: Medicare Other

## 2015-07-03 DIAGNOSIS — R262 Difficulty in walking, not elsewhere classified: Secondary | ICD-10-CM | POA: Diagnosis not present

## 2015-07-03 DIAGNOSIS — R2681 Unsteadiness on feet: Secondary | ICD-10-CM

## 2015-07-03 DIAGNOSIS — S91109D Unspecified open wound of unspecified toe(s) without damage to nail, subsequent encounter: Secondary | ICD-10-CM | POA: Diagnosis not present

## 2015-07-03 NOTE — Therapy (Signed)
[place], [name], [address] Phone: [phone]   Fax:  [phone]  Wound Care Therapy  Patient Details  Name: [name] MRN: [id number] Date of Birth: [birth year] No Data Recorded  Encounter Date: 07/03/2015      PT End of Session - 07/03/15 0842    Visit Number 23   Number of Visits 30   Date for PT Re-Evaluation 07/21/15   Authorization Type UHC Medicare    Authorization Time Period 04/22/15 to 06/21/05   Authorization - Visit Number 23   Authorization - Number of Visits 30   PT Start Time 0803   PT Stop Time 0838   PT Time Calculation (min) 35 min   Activity Tolerance Patient tolerated treatment well   Behavior During Therapy [facility] for tasks assessed/performed      Past Medical History  Diagnosis Date  . Hypertension   . Diabetes mellitus     type 2   . History of valve replacement 2010    [facility] Ease bioprosthetic 58mm  . Thyroid disease   . Stroke [facility]) 2003    R-sided weakness & upper extremity swelling   . Coronary artery disease   . Dyslipidemia   . At risk for sudden cardiac death 06-26-13  . PAF (paroxysmal atrial fibrillation) ([place]) 05/2013    converted with amiodarone to SR  . Chronic anticoagulation 05/2013    started  . S/P cardiac catheterization, Rt & Lt heart cath 06/05/2013 06/26/13  . S/P CABG x 3 2010  . GERD (gastroesophageal reflux disease)   . Cataract   . Chronic kidney disease     Patient reports he is seeing Kidney doctor in September    Past Surgical History  Procedure Laterality Date  . Coronary artery bypass graft  01/26/2009    LIMA to LAD, SVG to circumflex, SVG to PDA  . Aortic valve replacement  01/26/2009    [facility] Ease bioprosthetic 32mm valve  . Transthoracic echocardiogram  09/2011    grade 1 diastolic dysfunction; increasing valve gradient; calcified MV annulus   . Cardiac catheterization  06/05/2013    native 3 vessel  disease; patent LIMA to LAD, SVG to OM and SVG to PDA; Elevated right and left heart filling pressures, with predominantly pulmonary venous hypertension, normal PVR  . Transthoracic echocardiogram  06/03/2013    EF 25-30%, mod conc. hypertrophy, grade 3 diastolic dysfunction; LA mod dilated; calcified MV annulus; transaortic gradients are normal for the bioprosthetic valve; inf vena cava dilated (elevated CVP) - LifeVest  . Left and right heart catheterization with coronary angiogram N/A 06/05/2013    Procedure: LEFT AND RIGHT HEART CATHETERIZATION WITH CORONARY ANGIOGRAM;  Surgeon: Leonie Man, MD;  Location: [facility] CATH LAB;  Service: Cardiovascular;  Laterality: N/A;  . Graft(s) angiogram  06/05/2013    Procedure: GRAFT(S) Cyril Loosen;  Surgeon: Leonie Man, MD;  Location: [facility] CATH LAB;  Service: Cardiovascular;;    There were no vitals filed for this visit.  Visit Diagnosis:  Open toe wound, subsequent encounter  Difficulty walking  Unsteadiness      Subjective Assessment - 07/03/15 1850    Subjective Pain free, dressings intact.  Compliant with antibiotics   Currently in Pain? No/denies                   Wound Therapy - 07/03/15 1851    Subjective Pain free, dressings intact.  Compliant with antibiotics   Patient  and Family Stated Goals wound to heal    Date of Onset --  8 months ago   Prior Treatments --  has been using soap and water, salt water   Pain Assessment No/denies pain   Pain Score 0-No pain   Wound Properties Date First Assessed: 06/23/15 Time First Assessed: 0900 Wound Type: Other (Comment) Location: Toe (Comment  which one) Location Orientation: Right Wound Description (Comments): Lateral aspect of Rt great toe Present on Admission: No   Dressing Type Impregnated gauze (bismuth)   Dressing Changed Changed   Dressing Status Intact   Dressing Change Frequency Other (Comment)   Site / Wound Assessment Red   % Wound base Red or Granulating 70%   %  Wound base Yellow 30%   Peri-wound Assessment Intact   Margins Attached edges (approximated)   Drainage Amount Scant   Drainage Description Serosanguineous   Treatment Cleansed;Debridement (Selective)   Wound Properties Date First Assessed: 04/22/15 Time First Assessed: 1020 Wound Type: Other (Comment) , open wound   Location: Toe (Comment  which one) , R great toe   Location Orientation: Right Present on Admission: Yes   Dressing Type Impregnated gauze (bismuth)   Dressing Changed Changed   Dressing Status Old drainage   Dressing Change Frequency Other (Comment)   Site / Wound Assessment Granulation tissue;Yellow   % Wound base Red or Granulating 100%   % Wound base Yellow 0%   Peri-wound Assessment Intact   Margins Attached edges (approximated)   Closure Approximated   Drainage Amount None   Treatment Cleansed;Debridement (Selective)   Incision Properties Date First Assessed: 04/23/15 Time First Assessed: 1738 Location: Groin Location Orientation: Left Present on Admission: No   Selective Debridement - Location R great toe    Selective Debridement - Tools Used Forceps;Scalpel   Selective Debridement - Tissue Removed Slough and dry skin, callous edges   Wound Therapy - Clinical Statement Distal portion of toe with 100% granulation tissue, dry and scaly tissues surrounding.  Increased granulation to lateral aspect of toe with adherent slough in center of wound.  Continued selective debridement for removal of slough and dry skin perimeter to promote healing.  Xeroform and gauze dressing intact.  Decreased overall redness of toe with antibiotics though redness is still there, encouraged pt to follow prescription of antibiotics, verbalized compliance.  No reports of pain through session.     Wound Therapy - Functional Problem List non-healing wound complicated with DM and vascular compromise.    Factors Delaying/Impairing Wound Healing Altered sensation;Diabetes Mellitus;Infection -  systemic/local;Multiple medical problems;Vascular compromise   Wound Therapy - Frequency Other (comment)  2x/ week   Wound Therapy - Current Recommendations Diabetic teaching;PT   Wound Plan Continue with current PT POC with approximate dressings   Dressing  xeroform,  vaseline to perimeter, 2x2 and gauze            Problem List Patient Active Problem List   Diagnosis Date Noted  . PVD (peripheral vascular disease) ([place])   . S/P angioplasty 04/23/2015  . Peripheral arterial occlusive disease ([place]) 04/23/2015  . PAD (peripheral artery disease) ([place])   . Critical lower limb ischemia   . Toe ulcer, right ([place])   . Type II or unspecified type diabetes mellitus without mention of complication, uncontrolled 11/27/2013  . Acute respiratory failure with hypoxia ([place]) 11/26/2013  . CAP (community acquired pneumonia) 11/26/2013  . CHF exacerbation ([place]) 11/25/2013  . Cardiomyopathy, ischemic 06/13/2013  . At risk for sudden cardiac death  06/07/2013  . S/P cardiac catheterization, Rt & Lt heart cath 06/05/2013 06/07/2013  . Acute on chronic combined systolic and diastolic HF (heart failure), NYHA class 3 ([place]) 06/04/2013  . Atrial flutter with rapid ventricular response ([place]) 06/02/2013  . Acute renal failure: Cr up to 1.8 06/02/2013  . Hyperkalemia 06/02/2013    Class: Acute  . LBBB (left bundle branch block) 06/01/2013  . Hypotension- secondary to AF and Diltiazem Rx 06/01/2013  . Obesity (BMI 30-39.9) 06/01/2013  . Acute on chronic diastolic heart failure - due to Afib AVR 06/01/2013  . Atrial fibrillation with RVR- converted with Amiodarone 05/31/2013  . Stroke- Lt brain 2003 04/08/2013  . Hemiplegia affecting right dominant side ([place]) 04/08/2013  . Cataract 04/08/2013  . GERD (gastroesophageal reflux disease) 04/08/2013  . S/P CABG x 3 - 2010 04/08/2013  . S/P AVR-tissue, 2010 04/08/2013  . DM2 (diabetes mellitus, type 2) ([place]) 04/08/2013  . Dyslipidemia 04/08/2013  .  SUBACROMIAL BURSITIS, LEFT 11/05/2009   Ihor Austin, LPTA; CBIS [phone]  Aldona Lento 07/03/2015, 6:59 PM  [place], [name], [address] Phone: [phone]   Fax:  [phone]  Name: [name] MRN: [id number] Date of Birth: 1945/12/10

## 2015-07-04 ENCOUNTER — Other Ambulatory Visit: Payer: Self-pay | Admitting: Internal Medicine

## 2015-07-07 ENCOUNTER — Ambulatory Visit (HOSPITAL_COMMUNITY): Payer: Medicare Other

## 2015-07-07 DIAGNOSIS — S91109D Unspecified open wound of unspecified toe(s) without damage to nail, subsequent encounter: Secondary | ICD-10-CM

## 2015-07-07 DIAGNOSIS — R262 Difficulty in walking, not elsewhere classified: Secondary | ICD-10-CM | POA: Diagnosis not present

## 2015-07-07 DIAGNOSIS — R2681 Unsteadiness on feet: Secondary | ICD-10-CM

## 2015-07-07 NOTE — Therapy (Signed)
[place], [name], [address] Phone: [phone]   Fax:  [phone]  Wound Care Therapy  Patient Details  Name: [name] MRN: [id number] Date of Birth: 11-04-1945 No Data Recorded  Encounter Date: 07/07/2015      PT End of Session - 07/07/15 1820    Visit Number 24   Number of Visits 30   Date for PT Re-Evaluation 07/21/15   Authorization Type UHC Medicare    Authorization Time Period 04/22/15 to 06/21/05   Authorization - Visit Number 24   Authorization - Number of Visits 30   PT Start Time 0850   PT Stop Time 0928   PT Time Calculation (min) 38 min   Activity Tolerance Patient tolerated treatment well   Behavior During Therapy [facility] for tasks assessed/performed      Past Medical History  Diagnosis Date  . Hypertension   . Diabetes mellitus     type 2   . History of valve replacement 2010    [facility] Ease bioprosthetic 84mm  . Thyroid disease   . Stroke [facility]) 2003    R-sided weakness & upper extremity swelling   . Coronary artery disease   . Dyslipidemia   . At risk for sudden cardiac death June 27, 2013  . PAF (paroxysmal atrial fibrillation) ([place]) 05/2013    converted with amiodarone to SR  . Chronic anticoagulation 05/2013    started  . S/P cardiac catheterization, Rt & Lt heart cath 06/05/2013 06-27-13  . S/P CABG x 3 2010  . GERD (gastroesophageal reflux disease)   . Cataract   . Chronic kidney disease     Patient reports he is seeing Kidney doctor in September    Past Surgical History  Procedure Laterality Date  . Coronary artery bypass graft  01/26/2009    LIMA to LAD, SVG to circumflex, SVG to PDA  . Aortic valve replacement  01/26/2009    [facility] Ease bioprosthetic 8mm valve  . Transthoracic echocardiogram  09/2011    grade 1 diastolic dysfunction; increasing valve gradient; calcified MV annulus   . Cardiac catheterization  06/05/2013    native 3 vessel  disease; patent LIMA to LAD, SVG to OM and SVG to PDA; Elevated right and left heart filling pressures, with predominantly pulmonary venous hypertension, normal PVR  . Transthoracic echocardiogram  06/03/2013    EF 25-30%, mod conc. hypertrophy, grade 3 diastolic dysfunction; LA mod dilated; calcified MV annulus; transaortic gradients are normal for the bioprosthetic valve; inf vena cava dilated (elevated CVP) - LifeVest  . Left and right heart catheterization with coronary angiogram N/A 06/05/2013    Procedure: LEFT AND RIGHT HEART CATHETERIZATION WITH CORONARY ANGIOGRAM;  Surgeon: Leonie Man, MD;  Location: [facility] CATH LAB;  Service: Cardiovascular;  Laterality: N/A;  . Graft(s) angiogram  06/05/2013    Procedure: GRAFT(S) Cyril Loosen;  Surgeon: Leonie Man, MD;  Location: [facility] CATH LAB;  Service: Cardiovascular;;    There were no vitals filed for this visit.  Visit Diagnosis:  Open toe wound, subsequent encounter  Difficulty walking  Unsteadiness      Subjective Assessment - 07/07/15 1811    Subjective No pain today, dressing are intact, reports blood through dressings this session.   Currently in Pain? No/denies                   Wound Therapy - 07/07/15 1812    Subjective No pain today, dressing are intact, reports  blood through dressings this session.   Patient and Family Stated Goals wound to heal    Pain Assessment No/denies pain   Wound Properties Date First Assessed: 06/23/15 Time First Assessed: 0900 Wound Type: Other (Comment) Location: Toe (Comment  which one) Location Orientation: Right Wound Description (Comments): Lateral aspect of Rt great toe Present on Admission: No   Dressing Type Silver hydrofiber;Gauze (Comment)   Dressing Changed Changed   Dressing Status Intact   Dressing Change Frequency Other (Comment)   Site / Wound Assessment Red;Yellow   % Wound base Red or Granulating 70%   % Wound base Yellow 30%   Peri-wound Assessment Intact   Margins  Attached edges (approximated)   Drainage Amount Minimal   Drainage Description Serosanguineous   Treatment Cleansed;Debridement (Selective)   Wound Properties Date First Assessed: 04/22/15 Time First Assessed: 1020 Wound Type: Other (Comment) , open wound   Location: Toe (Comment  which one) , R great toe   Location Orientation: Right Present on Admission: Yes   Dressing Type Impregnated gauze (bismuth)   Dressing Changed Changed   Dressing Status Old drainage   Dressing Change Frequency Other (Comment)   Site / Wound Assessment Granulation tissue;Yellow   % Wound base Red or Granulating 100%   % Wound base Yellow 0%   Peri-wound Assessment Intact   Margins Attached edges (approximated)   Closure Approximated   Drainage Amount None   Treatment Cleansed;Debridement (Selective)   Incision Properties Date First Assessed: 04/23/15 Time First Assessed: 1738 Location: Groin Location Orientation: Left Present on Admission: No   Selective Debridement - Location R great toe    Selective Debridement - Tools Used Forceps;Scalpel   Selective Debridement - Tissue Removed Slough and dry skin, callous edges   Wound Therapy - Clinical Statement Noted dried blood on dressings from lateral toe wound.  Selective debridement for removall of dryskin and callouses on both wounds with noted bleeding.  Distal portion of toe with 100% granulation with just removal of dry skin required.  Lateral wound continues to have adhesive slough in center of wound with granulation tissues surrounding all edges, noted bleeding with debridement.  Changed dressings to silver hydrofiber for drainage on lateral aspect of toe, continued with xeroform on distal toe with vaseline perimeter and gauze dressings.  No reports of pain through session.     Wound Therapy - Functional Problem List non-healing wound complicated with DM and vascular compromise.    Factors Delaying/Impairing Wound Healing Altered sensation;Diabetes  Mellitus;Infection - systemic/local;Multiple medical problems;Vascular compromise   Wound Therapy - Frequency Other (comment)  2x/week   Wound Therapy - Current Recommendations Diabetic teaching;PT   Wound Plan Continue with current PT POC with approximate dressings   Dressing  xeroform,  vaseline to perimeter, 2x2 and gauze   Dressing silver hydrofiber on lateral, 2x2, gauze         Problem List Patient Active Problem List   Diagnosis Date Noted  . PVD (peripheral vascular disease) ([place])   . S/P angioplasty 04/23/2015  . Peripheral arterial occlusive disease ([place]) 04/23/2015  . PAD (peripheral artery disease) ([place])   . Critical lower limb ischemia   . Toe ulcer, right ([place])   . Type II or unspecified type diabetes mellitus without mention of complication, uncontrolled 11/27/2013  . Acute respiratory failure with hypoxia ([place]) 11/26/2013  . CAP (community acquired pneumonia) 11/26/2013  . CHF exacerbation ([place]) 11/25/2013  . Cardiomyopathy, ischemic 06/13/2013  . At risk for sudden cardiac death Jul 07, 2013  .  S/P cardiac catheterization, Rt & Lt heart cath 06/05/2013 06/07/2013  . Acute on chronic combined systolic and diastolic HF (heart failure), NYHA class 3 ([place]) 06/04/2013  . Atrial flutter with rapid ventricular response ([place]) 06/02/2013  . Acute renal failure: Cr up to 1.8 06/02/2013  . Hyperkalemia 06/02/2013    Class: Acute  . LBBB (left bundle branch block) 06/01/2013  . Hypotension- secondary to AF and Diltiazem Rx 06/01/2013  . Obesity (BMI 30-39.9) 06/01/2013  . Acute on chronic diastolic heart failure - due to Afib AVR 06/01/2013  . Atrial fibrillation with RVR- converted with Amiodarone 05/31/2013  . Stroke- Lt brain 2003 04/08/2013  . Hemiplegia affecting right dominant side ([place]) 04/08/2013  . Cataract 04/08/2013  . GERD (gastroesophageal reflux disease) 04/08/2013  . S/P CABG x 3 - 2010 04/08/2013  . S/P AVR-tissue, 2010 04/08/2013  . DM2 (diabetes  mellitus, type 2) ([place]) 04/08/2013  . Dyslipidemia 04/08/2013  . SUBACROMIAL BURSITIS, LEFT 11/05/2009   Ihor Austin, LPTA; CBIS [phone]  Aldona Lento 07/07/2015, 6:24 PM  [place], [name], [address] Phone: [phone]   Fax:  [phone]  Name: [name] MRN: [id number] Date of Birth: May 31, 1946

## 2015-07-08 ENCOUNTER — Ambulatory Visit
Admission: RE | Admit: 2015-07-08 | Discharge: 2015-07-08 | Disposition: A | Discharge status: Home / Self Care | Payer: Medicare Other | Source: Ambulatory Visit | Attending: Interventional Radiology | Admitting: Interventional Radiology

## 2015-07-08 DIAGNOSIS — I739 Peripheral vascular disease, unspecified: Secondary | ICD-10-CM

## 2015-07-08 DIAGNOSIS — L97529 Non-pressure chronic ulcer of other part of left foot with unspecified severity: Secondary | ICD-10-CM | POA: Diagnosis not present

## 2015-07-08 DIAGNOSIS — L97519 Non-pressure chronic ulcer of other part of right foot with unspecified severity: Secondary | ICD-10-CM | POA: Diagnosis not present

## 2015-07-08 NOTE — Progress Notes (Signed)
Chief Complaint: Patient was seen in consultation today for great toe wound, critical limb ischemia at the request of Felicie Morn  Original Referring Physician(s): Felicie Morn  History of Present Illness: Shivank I Nie is a 69 y.o. male initially referred for a slowly healing ulcer at the distal aspect of the great toe.  Workup revealed severe peripheral arterial disease with multilevel occlusions. He underwent initial recanalization of the chronically occluded superficial femoral artery on 04/23/2015.    We used a combination of carbon dioxide gas and dilute contrast material given his history of chronic renal insufficiency. His most recent labs demonstrate a stable creatinine. There is no evidence of deterioration of his renal function.  His ultrasound evaluation today demonstrates persistent normalization of the right ankle brachial index.  His toes recently bandaged having visited the wound care center yesterday. He reports that his toe bleeds well during debridements and continues to show evidence of healing week by week. Additionally, his toe is no longer painful as it was prior to the revascularization procedure. He continues to have decreased sensation which is the same as before and chronic in nature.  He has no active or new systemic complaints at this time. Overall, he is quite happy with his progress thus far.    Past Medical History  Diagnosis Date  . Hypertension   . Diabetes mellitus     type 2   . History of valve replacement 2010    [facility] Ease bioprosthetic 42mm  . Thyroid disease   . Stroke [facility]) 2003    R-sided weakness & upper extremity swelling   . Coronary artery disease   . Dyslipidemia   . At risk for sudden cardiac death June 30, 2013  . PAF (paroxysmal atrial fibrillation) ([place]) 05/2013    converted with amiodarone to SR  . Chronic anticoagulation 05/2013    started  . S/P cardiac catheterization, Rt & Lt heart cath 06/05/2013  06/30/2013  . S/P CABG x 3 2010  . GERD (gastroesophageal reflux disease)   . Cataract   . Chronic kidney disease     Patient reports he is seeing Kidney doctor in September    Past Surgical History  Procedure Laterality Date  . Coronary artery bypass graft  01/26/2009    LIMA to LAD, SVG to circumflex, SVG to PDA  . Aortic valve replacement  01/26/2009    [facility] Ease bioprosthetic 50mm valve  . Transthoracic echocardiogram  09/2011    grade 1 diastolic dysfunction; increasing valve gradient; calcified MV annulus   . Cardiac catheterization  06/05/2013    native 3 vessel disease; patent LIMA to LAD, SVG to OM and SVG to PDA; Elevated right and left heart filling pressures, with predominantly pulmonary venous hypertension, normal PVR  . Transthoracic echocardiogram  06/03/2013    EF 25-30%, mod conc. hypertrophy, grade 3 diastolic dysfunction; LA mod dilated; calcified MV annulus; transaortic gradients are normal for the bioprosthetic valve; inf vena cava dilated (elevated CVP) - LifeVest  . Left and right heart catheterization with coronary angiogram N/A 06/05/2013    Procedure: LEFT AND RIGHT HEART CATHETERIZATION WITH CORONARY ANGIOGRAM;  Surgeon: Leonie Man, MD;  Location: [facility] CATH LAB;  Service: Cardiovascular;  Laterality: N/A;  . Graft(s) angiogram  06/05/2013    Procedure: GRAFT(S) Cyril Loosen;  Surgeon: Leonie Man, MD;  Location: [facility] CATH LAB;  Service: Cardiovascular;;    Allergies: Review of patient's allergies indicates no known allergies.  Medications: Prior to Admission medications  Medication Sig Start Date End Date Taking? Authorizing Provider  acetaminophen (TYLENOL) 500 MG tablet Take 1,000 mg by mouth every 6 (six) hours as needed. For pain/headaches    Yes Historical Provider, MD  ALPRAZolam Duanne Moron) 1 MG tablet Take 1 mg by mouth 4 (four) times daily as needed for anxiety.    Yes Historical Provider, MD  amiodarone (PACERONE) 200 MG tablet Take 1 tablet  (200 mg total) by mouth daily. 10/30/14  Yes Pixie Casino, MD  apixaban (ELIQUIS) 5 MG TABS tablet Take 1 tablet (5 mg total) by mouth 2 (two) times daily. 04/15/15  Yes Pixie Casino, MD  clopidogrel (PLAVIX) 75 MG tablet Take 1 tablet (75 mg total) by mouth daily. 04/25/15  Yes Hedy Jacob, PA-C  docusate sodium (COLACE) 100 MG capsule Take 100 mg by mouth 2 (two) times daily.     Yes Historical Provider, MD  doxycycline (VIBRA-TABS) 100 MG tablet Take 100 mg by mouth 2 (two) times daily.   Yes Historical Provider, MD  fenofibrate (TRICOR) 145 MG tablet TAKE ONE (1) TABLET BY MOUTH EVERY DAY 07/06/15  Yes Pixie Casino, MD  ferrous sulfate 325 (65 FE) MG EC tablet Take 325 mg by mouth every morning.   Yes Historical Provider, MD  gabapentin (NEURONTIN) 300 MG capsule Take 300 mg by mouth 2 (two) times daily.    Yes Historical Provider, MD  HYDROcodone-acetaminophen (NORCO) 10-325 MG per tablet Take 1 tablet by mouth as needed for severe pain.  03/31/14  Yes Historical Provider, MD  insulin glargine (LANTUS) 100 UNIT/ML injection Inject 60-70 Units into the skin 2 (two) times daily. Takes70 units in the morning and 60 units in the evening   Yes Historical Provider, MD  insulin lispro (HUMALOG) 100 UNIT/ML injection Inject 50-60 Units into the skin 3 (three) times daily before meals. 50U BID and 60U QHS   Yes Historical Provider, MD  levothyroxine (SYNTHROID, LEVOTHROID) 25 MCG tablet Take 25 mcg by mouth daily.     Yes Historical Provider, MD  metoprolol succinate (TOPROL-XL) 25 MG 24 hr tablet Take 1 tablet (25 mg total) by mouth daily. 10/30/14  Yes Pixie Casino, MD  Omega-3 Fatty Acids (FISH OIL PO) Take 1 capsule by mouth 2 (two) times daily.    Yes Historical Provider, MD  pantoprazole (PROTONIX) 40 MG tablet Take 1 tablet (40 mg total) by mouth 2 (two) times daily. 10/30/14  Yes Pixie Casino, MD  silver sulfADIAZINE (SILVADENE) 1 % cream Apply 1 application topically 4 (four) times  daily.   Yes Historical Provider, MD  torsemide (DEMADEX) 10 MG tablet Take 1 tablet (10 mg total) by mouth daily. 06/29/15  Yes Pixie Casino, MD  atorvastatin (LIPITOR) 20 MG tablet Take 1 tablet (20 mg total) by mouth daily at 6 PM. 10/30/14   Pixie Casino, MD  bismuth subsalicylate (PEPTO BISMOL) 262 MG/15ML suspension Take 30 mLs by mouth every 6 (six) hours as needed for indigestion.    Historical Provider, MD  celecoxib (CELEBREX) 200 MG capsule Take 1 capsule by mouth 2 (two) times daily. 04/07/14   Historical Provider, MD     Family History  Problem Relation Age of Onset  . Heart attack Mother   . Liver disease Brother   . Diabetes Sister     x4    Social History   Social History  . Marital Status: Divorced    Spouse Name: N/A  . Number of Children: 4  .  Years of Education: N/A   Occupational History  . 4    Social History Main Topics  . Smoking status: Former Smoker    Types: Cigarettes    Quit date: 08/15/2001  . Smokeless tobacco: Former Systems developer    Types: Chew  . Alcohol Use: No  . Drug Use: No  . Sexual Activity: Not Currently   Other Topics Concern  . None   Social History Narrative    Review of Systems: A 12 point ROS discussed and pertinent positives are indicated in the HPI above.  All other systems are negative.  Review of Systems  Vital Signs: BP 152/70 mmHg  Pulse 71  Temp(Src) 98 F (36.7 C)  Resp 16  SpO2 96%  Physical Exam  Constitutional: He is oriented to person, place, and time. He appears well-developed and well-nourished.  HENT:  Head: Normocephalic and atraumatic.  Cardiovascular: Normal rate and regular rhythm.   Pulmonary/Chest: Effort normal.  Abdominal: Soft.  Musculoskeletal:       Feet:  Feet:  Right Foot:  Skin Integrity: Positive for skin breakdown.  Neurological: He is alert and oriented to person, place, and time.  Skin: Skin is warm and dry.  Psychiatric: He has a normal mood and affect. His behavior is  normal.  Nursing note and vitals reviewed.   Imaging: US Arterial Seg Multiple  07/08/2015  CLINICAL DATA:  69 year old male with a history of right great toe ulcer, with pre operative right sided ABI of 0.42 at rest. Preoperative left-sided ABI 0.55 arrest. These were measured 03/02/2015. Patient underwent revascularization of chronic total occlusion of the right superficial femoral artery with angioplasty and stenting. EXAM: NONINVASIVE PHYSIOLOGIC VASCULAR STUDY OF BILATERAL LOWER EXTREMITIES TECHNIQUE: Evaluation of both lower extremities was performed at rest, including calculation of ankle-brachial indices, multiple segmental pressure evaluation, segmental Doppler and segmental pulse volume recording. COMPARISON:  03/02/2015, CT 03/25/2015, angiogram 04/23/2015 Noninvasive 05/19/2015 FINDINGS: Right: Resting ankle brachial index:  0.93 Segmental blood pressure: Right brachial pressure measures 135 Doppler: Directed duplex of the right lower extremity demonstrates biphasic waveform of the common femoral artery. Monophasic waveform of the superficial femoral artery and popliteal artery. Right SFA and popliteal stent system is patent. Posterior tibial artery with monophasic signal. Pulse volume recording: Segmental PVR of the right lower extremity demonstrates subtle decrease in the quality of the waveform at the high thigh and calf. Augmentation is maintained from high thigh to below knee. Deterioration of the waveform at ankle and digital segment. Left: Resting ankle brachial index: 0.77 Segmental blood pressure: Left brachial pressure measures 137 Doppler: Segmental Doppler of the left lower extremity demonstrates biphasic waveform of the common femoral artery. Monophasic waveform of the popliteal artery, posterior tibial artery, and dorsalis pedis. Pulse volume recording: Segmental PVR of the left lower extremity demonstrates maintained waveform in the high thigh and low thigh with augmentation  maintained from high thigh to below knee. Deterioration of the waveform at the ankle and the left great toe. Additional: IMPRESSION: Right: Resting ankle-brachial index on the right in the normal range, with no evidence of significant arterial occlusive disease. Remainder of the study indicates patent right superficial femoral artery/ popliteal stent system, with persisting atherosclerotic changes and abnormal arterial waveform of the tibial vessels. Left: Resting ankle-brachial index on the left within the mild range of arterial occlusive disease. Remainder of the study demonstrates atherosclerotic changes of the left lower extremity, worst within the tibial vessels. Signed, Dulcy Fanny. Earleen Newport DO [facility]  [facility] Radiology Electronically Signed   By: Corrie Mckusick D.O.   On: 07/08/2015 15:32   [place] Lower Ext Art Right  07/08/2015  CLINICAL DATA:  69 year old male with a history of right great toe ulcer, with pre operative right sided ABI of 0.42 at rest. Preoperative left-sided ABI 0.55 arrest. These were measured 03/02/2015. Patient underwent revascularization of chronic total occlusion of the right superficial femoral artery with angioplasty and stenting. EXAM: NONINVASIVE PHYSIOLOGIC VASCULAR STUDY OF BILATERAL LOWER EXTREMITIES TECHNIQUE: Evaluation of both lower extremities was performed at rest, including calculation of ankle-brachial indices, multiple segmental pressure evaluation, segmental Doppler and segmental pulse volume recording. COMPARISON:  03/02/2015, CT 03/25/2015, angiogram 04/23/2015 Noninvasive 05/19/2015 FINDINGS: Right: Resting ankle brachial index:  0.93 Segmental blood pressure: Right brachial pressure measures 135 Doppler: Directed duplex of the right lower extremity demonstrates biphasic waveform of the common femoral artery. Monophasic waveform of the superficial femoral artery and popliteal artery. Right SFA and popliteal stent system is  patent. Posterior tibial artery with monophasic signal. Pulse volume recording: Segmental PVR of the right lower extremity demonstrates subtle decrease in the quality of the waveform at the high thigh and calf. Augmentation is maintained from high thigh to below knee. Deterioration of the waveform at ankle and digital segment. Left: Resting ankle brachial index: 0.77 Segmental blood pressure: Left brachial pressure measures 137 Doppler: Segmental Doppler of the left lower extremity demonstrates biphasic waveform of the common femoral artery. Monophasic waveform of the popliteal artery, posterior tibial artery, and dorsalis pedis. Pulse volume recording: Segmental PVR of the left lower extremity demonstrates maintained waveform in the high thigh and low thigh with augmentation maintained from high thigh to below knee. Deterioration of the waveform at the ankle and the left great toe. Additional: IMPRESSION: Right: Resting ankle-brachial index on the right in the normal range, with no evidence of significant arterial occlusive disease. Remainder of the study indicates patent right superficial femoral artery/ popliteal stent system, with persisting atherosclerotic changes and abnormal arterial waveform of the tibial vessels. Left: Resting ankle-brachial index on the left within the mild range of arterial occlusive disease. Remainder of the study demonstrates atherosclerotic changes of the left lower extremity, worst within the tibial vessels. Signed, Dulcy Fanny. Earleen Newport, DO [facility] Radiology Electronically Signed   By: Corrie Mckusick D.O.   On: 07/08/2015 15:32    Labs:  CBC:  Recent Labs  04/23/15 1000  WBC 7.0  HGB 13.8  HCT 41.5  PLT 160    COAGS:  Recent Labs  04/23/15 1000  INR 1.07    BMP:  Recent Labs  03/18/15 0831 04/23/15 1000 06/29/15 1010  NA  --  138 141  K  --  4.7 4.3  CL  --  103 100  CO2  --  26 28  GLUCOSE  --  277*  192*  BUN 22 21* 23  CALCIUM  --  9.6 9.7  CREATININE 1.35* 1.35* 1.38*  GFRNONAA 54* 53*  --   GFRAA 63 >60  --     LIVER FUNCTION TESTS:  Recent Labs  06/29/15 1010  BILITOT 0.4  AST 32  ALT 50*  ALKPHOS 50  PROT 6.6  ALBUMIN 4.2    TUMOR MARKERS: No results for input(s): AFPTM, CEA, CA199, CHROMGRNA in the last 8760 hours.  Assessment and Plan:   Significant improvement following recanalization of the chronically occluded right superficial femoral artery.  His renal function remains unchanged, no evidence of deterioration  following his procedure.  He is experiencing gains in wound healing. His initial distal great toe wound is completely healed. However, he has a second wound caused by overly tight dressing compression which is persisting but healing.  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 Reestablishing patency of this vessel would provide in-line flow to the wound angiosome promoting further healing.   I discussed this at length with Mr. Woulfe and his wife. At this time, he is disinclined to pursue further intervention until he has seen a plateau or slowing of his progress at the wound care center. I told him I will see him again in 4-6 weeks and we can reassess at that time. I do do believe he will ultimately need recanalization of his occluded anterior tibial artery.  1.) Return clinic visit in 4-6 weeks to assess healing of the great toe. If the wound persists at that time we should strongly consider further intervention to open the AT. 2.) Continue wound care.   SignedJacqulynn Cadet 07/08/2015, 4:34 PM   I spent a total of  15 Minutes in face to face in clinical consultation, greater than 50% of which was counseling/coordinating care for critical limb ischemia.

## 2015-07-13 ENCOUNTER — Ambulatory Visit (HOSPITAL_COMMUNITY): Payer: Medicare Other | Admitting: Physical Therapy

## 2015-07-13 ENCOUNTER — Other Ambulatory Visit: Payer: Self-pay | Admitting: Licensed Clinical Social Worker

## 2015-07-13 DIAGNOSIS — S91109D Unspecified open wound of unspecified toe(s) without damage to nail, subsequent encounter: Secondary | ICD-10-CM | POA: Diagnosis not present

## 2015-07-13 DIAGNOSIS — R2681 Unsteadiness on feet: Secondary | ICD-10-CM

## 2015-07-13 DIAGNOSIS — R262 Difficulty in walking, not elsewhere classified: Secondary | ICD-10-CM

## 2015-07-13 NOTE — Therapy (Signed)
[place], [name], [address] Phone: [phone]   Fax:  [phone]  Wound Care Therapy  Patient Details  Name: [name] MRN: [id number] Date of Birth: August 28, 1945 No Data Recorded  Encounter Date: 07/13/2015    Past Medical History  Diagnosis Date  . Hypertension   . Diabetes mellitus     type 2   . History of valve replacement 2010    [facility] Ease bioprosthetic 25mm  . Thyroid disease   . Stroke [facility]) 2003    R-sided weakness & upper extremity swelling   . Coronary artery disease   . Dyslipidemia   . At risk for sudden cardiac death 06/15/13  . PAF (paroxysmal atrial fibrillation) ([place]) 05/2013    converted with amiodarone to SR  . Chronic anticoagulation 05/2013    started  . S/P cardiac catheterization, Rt & Lt heart cath 06/05/2013 06-15-13  . S/P CABG x 3 2010  . GERD (gastroesophageal reflux disease)   . Cataract   . Chronic kidney disease     Patient reports he is seeing Kidney doctor in September    Past Surgical History  Procedure Laterality Date  . Coronary artery bypass graft  01/26/2009    LIMA to LAD, SVG to circumflex, SVG to PDA  . Aortic valve replacement  01/26/2009    [facility] Ease bioprosthetic 59mm valve  . Transthoracic echocardiogram  09/2011    grade 1 diastolic dysfunction; increasing valve gradient; calcified MV annulus   . Cardiac catheterization  06/05/2013    native 3 vessel disease; patent LIMA to LAD, SVG to OM and SVG to PDA; Elevated right and left heart filling pressures, with predominantly pulmonary venous hypertension, normal PVR  . Transthoracic echocardiogram  06/03/2013    EF 25-30%, mod conc. hypertrophy, grade 3 diastolic dysfunction; LA mod dilated; calcified MV annulus; transaortic gradients are normal for the bioprosthetic valve; inf vena cava dilated (elevated CVP) - LifeVest  . Left and right heart catheterization with coronary  angiogram N/A 06/05/2013    Procedure: LEFT AND RIGHT HEART CATHETERIZATION WITH CORONARY ANGIOGRAM;  Surgeon: Leonie Man, MD;  Location: [facility] CATH LAB;  Service: Cardiovascular;  Laterality: N/A;  . Graft(s) angiogram  06/05/2013    Procedure: GRAFT(S) Cyril Loosen;  Surgeon: Leonie Man, MD;  Location: [facility] CATH LAB;  Service: Cardiovascular;;    There were no vitals filed for this visit.  Visit Diagnosis:  Open toe wound, subsequent encounter  Difficulty walking  Unsteadiness                 Wound Therapy - 07/13/15 1824    Subjective Pt states his toe is doing good and is no longer taking antibiotics.   Patient and Family Stated Goals wound to heal    Pain Assessment No/denies pain   Wound Properties Date First Assessed: 06/23/15 Time First Assessed: 0900 Wound Type: Other (Comment) Location: Toe (Comment  which one) Location Orientation: Right Wound Description (Comments): Lateral aspect of Rt great toe Present on Admission: No   Dressing Type Silver hydrofiber;Gauze (Comment)   Dressing Changed Changed   Dressing Status Intact   Dressing Change Frequency Other (Comment)   Site / Wound Assessment Red;Yellow   % Wound base Red or Granulating 70%   % Wound base Yellow 30%   Peri-wound Assessment Intact   Margins Attached edges (approximated)   Drainage Amount Minimal   Drainage Description Serosanguineous   Treatment Cleansed;Debridement (Selective)  Wound Properties Date First Assessed: 04/22/15 Time First Assessed: 1020 Wound Type: Other (Comment) , open wound   Location: Toe (Comment  which one) , R great toe   Location Orientation: Right Present on Admission: Yes   Dressing Type Impregnated gauze (bismuth)   Dressing Changed Changed   Dressing Status Old drainage   Dressing Change Frequency Other (Comment)   Site / Wound Assessment Granulation tissue;Yellow   % Wound base Red or Granulating 100%   % Wound base Yellow 0%   Peri-wound Assessment Intact    Margins Attached edges (approximated)   Closure Approximated   Drainage Amount None   Treatment Cleansed;Debridement (Selective)   Incision Properties Date First Assessed: 04/23/15 Time First Assessed: 1738 Location: Groin Location Orientation: Left Present on Admission: No   Selective Debridement - Location R great toe    Selective Debridement - Tools Used Forceps;Scalpel   Selective Debridement - Tissue Removed Slough and dry skin, callous edges   Wound Therapy - Clinical Statement Lateral wound continues to present with sanginous drainage.  Selective debridement for removal of dryskin and callouses perimeter of both wounds.  Distal portion of toe with 100% granulation with just removal of dry skin required.  Lateral wound continues to have adhesive slough in center of wound with granulation tissues surrounding all edges, noted bleeding with debridement.  Continued hydrofiber for drainage on lateral aspect of toe, continued with xeroform on distal toe with vaseline perimeter and gauze dressings.  No reports of pain through session.     Wound Therapy - Functional Problem List non-healing wound complicated with DM and vascular compromise.    Factors Delaying/Impairing Wound Healing Altered sensation;Diabetes Mellitus;Infection - systemic/local;Multiple medical problems;Vascular compromise   Wound Therapy - Frequency Other (comment)  2x/week   Wound Therapy - Current Recommendations Diabetic teaching;PT   Wound Plan Continue with current PT POC with approximate dressings   Dressing  xeroform,  vaseline to perimeter, 2x2 and gauze   Dressing silver hydrofiber on lateral, 2x2, gauze                              Problem List Patient Active Problem List   Diagnosis Date Noted  . PVD (peripheral vascular disease) ([place])   . S/P angioplasty 04/23/2015  . Peripheral arterial occlusive disease ([place]) 04/23/2015  . PAD (peripheral artery disease) ([place])   . Critical lower limb  ischemia   . Toe ulcer, right ([place])   . Type II or unspecified type diabetes mellitus without mention of complication, uncontrolled 11/27/2013  . Acute respiratory failure with hypoxia ([place]) 11/26/2013  . CAP (community acquired pneumonia) 11/26/2013  . CHF exacerbation ([place]) 11/25/2013  . Cardiomyopathy, ischemic 06/13/2013  . At risk for sudden cardiac death 06-18-13  . S/P cardiac catheterization, Rt & Lt heart cath 06/05/2013 06-18-13  . Acute on chronic combined systolic and diastolic HF (heart failure), NYHA class 3 ([place]) 06/04/2013  . Atrial flutter with rapid ventricular response ([place]) 06/02/2013  . Acute renal failure: Cr up to 1.8 06/02/2013  . Hyperkalemia 06/02/2013    Class: Acute  . LBBB (left bundle branch block) 06/01/2013  . Hypotension- secondary to AF and Diltiazem Rx 06/01/2013  . Obesity (BMI 30-39.9) 06/01/2013  . Acute on chronic diastolic heart failure - due to Afib AVR 06/01/2013  . Atrial fibrillation with RVR- converted with Amiodarone 05/31/2013  . Stroke- Lt brain 2003 04/08/2013  . Hemiplegia affecting right dominant side ([place])  04/08/2013  . Cataract 04/08/2013  . GERD (gastroesophageal reflux disease) 04/08/2013  . S/P CABG x 3 - 2010 04/08/2013  . S/P AVR-tissue, 2010 04/08/2013  . DM2 (diabetes mellitus, type 2) ([place]) 04/08/2013  . Dyslipidemia 04/08/2013  . SUBACROMIAL BURSITIS, LEFT 11/05/2009    Teena Irani, PTA/CLT [phone]  07/13/2015, 6:26 PM  [facility] [address] [place], [name], [address] Phone: [phone]   Fax:  [phone]  Name: [name] MRN: [id number] Date of Birth: 1946/02/04

## 2015-07-13 NOTE — Patient Outreach (Signed)
Assessment:  CSW called home phone number of client on 07/13/15.  CSW left phone message for Kerry Barklow on 07/13/15 requesting that he return call to CSW at [phone] to discuss current client needs. CSW later spoke with client via phone on 07/13/15. CSW verified identity of client.  Client said he does not own mobile home where he lives and mobile home where he resides has repair needs for heating system in mobile home.  He said he is using a small heater to help him stay warm at present.  He said he has financial difficulties each month. He said he has his prescribed medications and is taking medications as prescribed.  He has [employer].  He said he earns too much to qualify for Medicaid.  He said he receives food stamps benefit monthly  but that this benefit is very low. He said he has been going to scheduled medical appointments. He drives himself to some scheduled appointments.  He said he has a scheduled appointment with Dr. Gerarda Fraction on 07/21/15.  He said he also has some transport help from his niece periodically.  He said he has an adequate food siupply.  CSW spoke with client about client calling local [employer] regarding heat assistance. CSW encouraged client to call local [employer] to described to agency the current heating needs of client. Client said he would try to call [employer] for help. CSW thanked client for phone call with CSW on 07/13/15.  Plan: Client to call local [employer] to talk with representative of agency about heating support and assistance for client CSW to call client in one week to assess needs of client.   Norva Riffle.Brynli Ollis MSW, LCSW Licensed Clinical Social Worker [facility] Care Management [phone].

## 2015-07-15 ENCOUNTER — Other Ambulatory Visit: Payer: Self-pay | Admitting: Internal Medicine

## 2015-07-15 ENCOUNTER — Other Ambulatory Visit (HOSPITAL_COMMUNITY): Payer: Self-pay | Admitting: Interventional Radiology

## 2015-07-15 NOTE — Telephone Encounter (Signed)
Rx request sent to pharmacy.  

## 2015-07-16 ENCOUNTER — Ambulatory Visit (HOSPITAL_COMMUNITY): Payer: Medicare Other | Attending: Orthopaedic Surgery | Admitting: Physical Therapy

## 2015-07-16 DIAGNOSIS — R262 Difficulty in walking, not elsewhere classified: Secondary | ICD-10-CM | POA: Diagnosis not present

## 2015-07-16 DIAGNOSIS — R2681 Unsteadiness on feet: Secondary | ICD-10-CM

## 2015-07-16 DIAGNOSIS — S91109D Unspecified open wound of unspecified toe(s) without damage to nail, subsequent encounter: Secondary | ICD-10-CM | POA: Insufficient documentation

## 2015-07-16 NOTE — Therapy (Signed)
[place], [name], [address] Phone: [phone]   Fax:  [phone]  Wound Care Therapy  Patient Details  Name: [name] MRN: [id number] Date of Birth: 08-Mar-1946 No Data Recorded  Encounter Date: 07/16/2015      PT End of Session - 07/16/15 0852    Visit Number 25   Number of Visits 30   Date for PT Re-Evaluation 07/21/15   Authorization Type [employer] Medicare    Authorization Time Period 04/22/15 to 06/21/05   Authorization - Visit Number 25   Authorization - Number of Visits 30   PT Start Time 0804   PT Stop Time [id number]   PT Time Calculation (min) 33 min   Activity Tolerance Patient tolerated treatment well   Behavior During Therapy [facility] for tasks assessed/performed      Past Medical History  Diagnosis Date  . Hypertension   . Diabetes mellitus     type 2   . History of valve replacement 2010    [facility] Ease bioprosthetic 22mm  . Thyroid disease   . Stroke [facility]) 2003    R-sided weakness & upper extremity swelling   . Coronary artery disease   . Dyslipidemia   . At risk for sudden cardiac death 12-Jun-2013  . PAF (paroxysmal atrial fibrillation) ([place]) 05/2013    converted with amiodarone to SR  . Chronic anticoagulation 05/2013    started  . S/P cardiac catheterization, Rt & Lt heart cath 06/05/2013 06/12/2013  . S/P CABG x 3 2010  . GERD (gastroesophageal reflux disease)   . Cataract   . Chronic kidney disease     Patient reports he is seeing Kidney doctor in September    Past Surgical History  Procedure Laterality Date  . Coronary artery bypass graft  01/26/2009    LIMA to LAD, SVG to circumflex, SVG to PDA  . Aortic valve replacement  01/26/2009    [facility] Ease bioprosthetic 32mm valve  . Transthoracic echocardiogram  09/2011    grade 1 diastolic dysfunction; increasing valve gradient; calcified MV annulus   . Cardiac catheterization  06/05/2013    native 3 vessel disease;  patent LIMA to LAD, SVG to OM and SVG to PDA; Elevated right and left heart filling pressures, with predominantly pulmonary venous hypertension, normal PVR  . Transthoracic echocardiogram  06/03/2013    EF 25-30%, mod conc. hypertrophy, grade 3 diastolic dysfunction; LA mod dilated; calcified MV annulus; transaortic gradients are normal for the bioprosthetic valve; inf vena cava dilated (elevated CVP) - LifeVest  . Left and right heart catheterization with coronary angiogram N/A 06/05/2013    Procedure: LEFT AND RIGHT HEART CATHETERIZATION WITH CORONARY ANGIOGRAM;  Surgeon: Leonie Man, MD;  Location: [facility] CATH LAB;  Service: Cardiovascular;  Laterality: N/A;  . Graft(s) angiogram  06/05/2013    Procedure: GRAFT(S) Cyril Loosen;  Surgeon: Leonie Man, MD;  Location: [facility] CATH LAB;  Service: Cardiovascular;;    There were no vitals filed for this visit.  Visit Diagnosis:  Open toe wound, subsequent encounter  Difficulty walking  Unsteadiness                 Wound Therapy - 07/16/15 0841    Subjective Patient states no pain today, overall very pleasant and friendly and reports he really would like his toe to heal so he doesn't lose it    Patient and Family Stated Goals wound to heal    Pain Assessment  No/denies pain   Wound Properties Date First Assessed: 06/23/15 Time First Assessed: 0900 Wound Type: Other (Comment) Location: Toe (Comment  which one) Location Orientation: Right Wound Description (Comments): Lateral aspect of Rt great toe Present on Admission: No   Dressing Type Silver hydrofiber;Gauze (Comment)   Dressing Changed Changed   Dressing Status Intact   Dressing Change Frequency Other (Comment)   Site / Wound Assessment Red;Yellow   % Wound base Red or Granulating 70%   % Wound base Yellow 30%   Peri-wound Assessment Intact   Margins Attached edges (approximated)   Drainage Amount Minimal   Drainage Description Serosanguineous   Treatment Cleansed;Debridement  (Selective)   Wound Properties Date First Assessed: 04/22/15 Time First Assessed: 1020 Wound Type: Other (Comment) , open wound   Location: Toe (Comment  which one) , R great toe   Location Orientation: Right Present on Admission: Yes   Dressing Type Impregnated gauze (bismuth)   Dressing Changed Changed   Dressing Status Old drainage   Dressing Change Frequency Other (Comment)   Site / Wound Assessment Granulation tissue;Yellow   % Wound base Red or Granulating 100%   % Wound base Yellow 0%   Peri-wound Assessment Intact   Margins Attached edges (approximated)   Closure Approximated   Drainage Amount None   Treatment Cleansed;Debridement (Selective);Other (Comment);Packing (Impregnated strip)  xeroform    Incision Properties Date First Assessed: 04/23/15 Time First Assessed: 1738 Location: Groin Location Orientation: Left Present on Admission: No   Selective Debridement - Location R great toe    Selective Debridement - Tools Used Forceps;Scalpel   Selective Debridement - Tissue Removed Slough and dry skin, callous edges   Wound Therapy - Clinical Statement Patient continues to present with chronic R toe wounds, which were treated during today's session. Continued to address tip of toe with debridement and xeroform and performed skilled debridement and applied silver dressings to lateral toe wound. Slough on lateral wound much more pliable and easy to remove today, and tip of toe continues to display dry skin that requires debridement.     Wound Therapy - Functional Problem List non-healing wound complicated with DM and vascular compromise.    Factors Delaying/Impairing Wound Healing Altered sensation;Diabetes Mellitus;Infection - systemic/local;Multiple medical problems;Vascular compromise   Wound Therapy - Frequency Other (comment)   Wound Therapy - Current Recommendations Diabetic teaching;PT   Wound Plan Continue with current PT POC with approximate dressings   Dressing  xeroform,   vaseline to perimeter, and gauze   Dressing silver hydrofiber on lateral, 2x2, gauze                 PT Education - 07/16/15 0852    Education provided No                     Problem List Patient Active Problem List   Diagnosis Date Noted  . PVD (peripheral vascular disease) ([place])   . S/P angioplasty 04/23/2015  . Peripheral arterial occlusive disease ([place]) 04/23/2015  . PAD (peripheral artery disease) ([place])   . Critical lower limb ischemia   . Toe ulcer, right ([place])   . Type II or unspecified type diabetes mellitus without mention of complication, uncontrolled 11/27/2013  . Acute respiratory failure with hypoxia ([place]) 11/26/2013  . CAP (community acquired pneumonia) 11/26/2013  . CHF exacerbation ([place]) 11/25/2013  . Cardiomyopathy, ischemic 06/13/2013  . At risk for sudden cardiac death 06/28/13  . S/P cardiac catheterization, Rt & Lt heart cath 06/05/2013  06/07/2013  . Acute on chronic combined systolic and diastolic HF (heart failure), NYHA class 3 ([place]) 06/04/2013  . Atrial flutter with rapid ventricular response ([place]) 06/02/2013  . Acute renal failure: Cr up to 1.8 06/02/2013  . Hyperkalemia 06/02/2013    Class: Acute  . LBBB (left bundle branch block) 06/01/2013  . Hypotension- secondary to AF and Diltiazem Rx 06/01/2013  . Obesity (BMI 30-39.9) 06/01/2013  . Acute on chronic diastolic heart failure - due to Afib AVR 06/01/2013  . Atrial fibrillation with RVR- converted with Amiodarone 05/31/2013  . Stroke- Lt brain 2003 04/08/2013  . Hemiplegia affecting right dominant side ([place]) 04/08/2013  . Cataract 04/08/2013  . GERD (gastroesophageal reflux disease) 04/08/2013  . S/P CABG x 3 - 2010 04/08/2013  . S/P AVR-tissue, 2010 04/08/2013  . DM2 (diabetes mellitus, type 2) ([place]) 04/08/2013  . Dyslipidemia 04/08/2013  . SUBACROMIAL BURSITIS, LEFT 11/05/2009    Deniece Ree PT, DPT [facility] [address] [place], [name], [address] Phone: [phone]   Fax:  [phone]  Name: [name] MRN: [id number] Date of Birth: Jul 03, 1946

## 2015-07-17 ENCOUNTER — Other Ambulatory Visit: Payer: Self-pay | Admitting: Licensed Clinical Social Worker

## 2015-07-17 NOTE — Patient Outreach (Signed)
Assessment:  CSW called phone number for client on 07/17/15 and spoke via phone with client on 07/17/15. CSW verified identity of client.  CSW spoke with client about heating needs of client. CSW encouraged client to call local [employer] at [phone] to speak with Michell Heinrich about heating needs resources for client. CSW spoke with client about [employer]'s [employer] Program.  CSW encouraged client to call Verdie Drown at [employer] to discuss resources for client's needs.  Client said that owner of mobile home could not afford to have furnace repaired at client's home. Client said he could not afford furnace repairs. Client said most of his other basic needs were taken care of at present. He said he uses a small heater at mobile home to help him manage cold temperatures.  CSW thanked client for phone conversation with CSW on 07/17/15.  Plan: Client to call [employer] in [place], [name] in next 30 days to speak with Michell Heinrich about heating resources for client through that agency. CSW to call client in two weeks to assess client needs.  Norva Riffle.Esley Brooking MSW, LCSW Licensed Clinical Social Worker [facility] Care Management [phone]

## 2015-07-20 ENCOUNTER — Ambulatory Visit (HOSPITAL_COMMUNITY): Payer: Medicare Other | Admitting: Physical Therapy

## 2015-07-20 DIAGNOSIS — R2681 Unsteadiness on feet: Secondary | ICD-10-CM | POA: Diagnosis not present

## 2015-07-20 DIAGNOSIS — R262 Difficulty in walking, not elsewhere classified: Secondary | ICD-10-CM

## 2015-07-20 DIAGNOSIS — S91109D Unspecified open wound of unspecified toe(s) without damage to nail, subsequent encounter: Secondary | ICD-10-CM

## 2015-07-20 NOTE — Therapy (Signed)
[place], [name], [address] Phone: [phone]   Fax:  [phone]  Wound Care Therapy  Patient Details  Name: [name] MRN: [id number] Date of Birth: 1946-02-02 No Data Recorded  Encounter Date: 07/20/2015      PT End of Session - 07/20/15 1408    Visit Number 26   Number of Visits 30   Date for PT Re-Evaluation 07/21/15   Authorization Type UHC Medicare    Authorization Time Period 04/22/15 to 06/21/05   Authorization - Visit Number 51   Authorization - Number of Visits 30   PT Start Time 0845   PT Stop Time 0915   PT Time Calculation (min) 30 min   Activity Tolerance Patient tolerated treatment well   Behavior During Therapy [facility] for tasks assessed/performed      Past Medical History  Diagnosis Date  . Hypertension   . Diabetes mellitus     type 2   . History of valve replacement 2010    [facility] Ease bioprosthetic 34mm  . Thyroid disease   . Stroke [facility]) 2003    R-sided weakness & upper extremity swelling   . Coronary artery disease   . Dyslipidemia   . At risk for sudden cardiac death July 02, 2013  . PAF (paroxysmal atrial fibrillation) ([place]) 05/2013    converted with amiodarone to SR  . Chronic anticoagulation 05/2013    started  . S/P cardiac catheterization, Rt & Lt heart cath 06/05/2013 07/02/13  . S/P CABG x 3 2010  . GERD (gastroesophageal reflux disease)   . Cataract   . Chronic kidney disease     Patient reports he is seeing Kidney doctor in September    Past Surgical History  Procedure Laterality Date  . Coronary artery bypass graft  01/26/2009    LIMA to LAD, SVG to circumflex, SVG to PDA  . Aortic valve replacement  01/26/2009    [facility] Ease bioprosthetic 80mm valve  . Transthoracic echocardiogram  09/2011    grade 1 diastolic dysfunction; increasing valve gradient; calcified MV annulus   . Cardiac catheterization  06/05/2013    native 3 vessel disease;  patent LIMA to LAD, SVG to OM and SVG to PDA; Elevated right and left heart filling pressures, with predominantly pulmonary venous hypertension, normal PVR  . Transthoracic echocardiogram  06/03/2013    EF 25-30%, mod conc. hypertrophy, grade 3 diastolic dysfunction; LA mod dilated; calcified MV annulus; transaortic gradients are normal for the bioprosthetic valve; inf vena cava dilated (elevated CVP) - LifeVest  . Left and right heart catheterization with coronary angiogram N/A 06/05/2013    Procedure: LEFT AND RIGHT HEART CATHETERIZATION WITH CORONARY ANGIOGRAM;  Surgeon: Leonie Man, MD;  Location: [facility] CATH LAB;  Service: Cardiovascular;  Laterality: N/A;  . Graft(s) angiogram  06/05/2013    Procedure: GRAFT(S) Cyril Loosen;  Surgeon: Leonie Man, MD;  Location: [facility] CATH LAB;  Service: Cardiovascular;;    There were no vitals filed for this visit.  Visit Diagnosis:  Open toe wound, subsequent encounter  Difficulty walking  Unsteadiness                 Wound Therapy - 07/20/15 1405    Subjective Patient states no pain today, overall very pleasant and friendly and reports he really would like his toe to heal so he doesn't lose it    Patient and Family Stated Goals wound to heal    Wound Properties  Date First Assessed: 06/23/15 Time First Assessed: 0900 Wound Type: Other (Comment) Location: Toe (Comment  which one) Location Orientation: Right Wound Description (Comments): Lateral aspect of Rt great toe Present on Admission: No   Dressing Type Impregnated gauze (bismuth)   Dressing Changed Changed   Dressing Status Intact   Dressing Change Frequency Other (Comment)   Site / Wound Assessment Red;Yellow   % Wound base Red or Granulating 70%   % Wound base Yellow 30%   Peri-wound Assessment Intact   Margins Attached edges (approximated)   Drainage Amount Minimal   Drainage Description Serosanguineous   Treatment Cleansed;Debridement (Selective)   Wound Properties Date  First Assessed: 04/22/15 Time First Assessed: 1020 Wound Type: Other (Comment) , open wound   Location: Toe (Comment  which one) , R great toe   Location Orientation: Right Present on Admission: Yes   Dressing Type Impregnated gauze (bismuth)   Dressing Changed Changed   Dressing Status Old drainage   Dressing Change Frequency Other (Comment)   Site / Wound Assessment Granulation tissue;Yellow   % Wound base Red or Granulating 100%   % Wound base Yellow 0%   Peri-wound Assessment Intact   Margins Attached edges (approximated)   Closure Approximated   Drainage Amount None   Treatment Cleansed;Debridement (Selective)   Incision Properties Date First Assessed: 04/23/15 Time First Assessed: 1738 Location: Groin Location Orientation: Left Present on Admission: No   Selective Debridement - Location R great toe    Selective Debridement - Tools Used Forceps;Scalpel   Selective Debridement - Tissue Removed Slough and dry skin, callous edges   Wound Therapy - Clinical Statement Great toe with decreased drainage and no signs/symptoms of infection.  Changed lateral wound to xeroform as well today due to reduced drainage and improved granulation. Cleansed and moisturized area well prior to reapplication of dressing and bandaging.      Wound Therapy - Functional Problem List non-healing wound complicated with DM and vascular compromise.    Factors Delaying/Impairing Wound Healing Altered sensation;Diabetes Mellitus;Infection - systemic/local;Multiple medical problems;Vascular compromise   Wound Therapy - Frequency Other (comment)   Wound Therapy - Current Recommendations Diabetic teaching;PT   Wound Plan Continue with current PT POC with approximate dressings   Dressing  xeroform,  vaseline to perimeter, and gauze   Dressing xeroform on both areas now, 2x2, gauze                              Problem List Patient Active Problem List   Diagnosis Date Noted  . PVD (peripheral  vascular disease) ([place])   . S/P angioplasty 04/23/2015  . Peripheral arterial occlusive disease ([place]) 04/23/2015  . PAD (peripheral artery disease) ([place])   . Critical lower limb ischemia   . Toe ulcer, right ([place])   . Type II or unspecified type diabetes mellitus without mention of complication, uncontrolled 11/27/2013  . Acute respiratory failure with hypoxia ([place]) 11/26/2013  . CAP (community acquired pneumonia) 11/26/2013  . CHF exacerbation ([place]) 11/25/2013  . Cardiomyopathy, ischemic 06/13/2013  . At risk for sudden cardiac death June 12, 2013  . S/P cardiac catheterization, Rt & Lt heart cath 06/05/2013 06/12/13  . Acute on chronic combined systolic and diastolic HF (heart failure), NYHA class 3 ([place]) 06/04/2013  . Atrial flutter with rapid ventricular response ([place]) 06/02/2013  . Acute renal failure: Cr up to 1.8 06/02/2013  . Hyperkalemia 06/02/2013    Class: Acute  . LBBB (left  bundle branch block) 06/01/2013  . Hypotension- secondary to AF and Diltiazem Rx 06/01/2013  . Obesity (BMI 30-39.9) 06/01/2013  . Acute on chronic diastolic heart failure - due to Afib AVR 06/01/2013  . Atrial fibrillation with RVR- converted with Amiodarone 05/31/2013  . Stroke- Lt brain 2003 04/08/2013  . Hemiplegia affecting right dominant side ([place]) 04/08/2013  . Cataract 04/08/2013  . GERD (gastroesophageal reflux disease) 04/08/2013  . S/P CABG x 3 - 2010 04/08/2013  . S/P AVR-tissue, 2010 04/08/2013  . DM2 (diabetes mellitus, type 2) ([place]) 04/08/2013  . Dyslipidemia 04/08/2013  . SUBACROMIAL BURSITIS, LEFT 11/05/2009    Teena Irani, PTA/CLT [phone]  07/20/2015, 2:09 PM  [place], [name], [address] Phone: [phone]   Fax:  [phone]  Name: [name] MRN: [id number] Date of Birth: 10/16/1945

## 2015-07-21 ENCOUNTER — Ambulatory Visit: Payer: Medicare Other | Admitting: Gastroenterology

## 2015-07-21 DIAGNOSIS — F419 Anxiety disorder, unspecified: Secondary | ICD-10-CM | POA: Diagnosis not present

## 2015-07-21 DIAGNOSIS — I10 Essential (primary) hypertension: Secondary | ICD-10-CM | POA: Diagnosis not present

## 2015-07-21 DIAGNOSIS — E1165 Type 2 diabetes mellitus with hyperglycemia: Secondary | ICD-10-CM | POA: Diagnosis not present

## 2015-07-21 DIAGNOSIS — Z1389 Encounter for screening for other disorder: Secondary | ICD-10-CM | POA: Diagnosis not present

## 2015-07-21 DIAGNOSIS — G894 Chronic pain syndrome: Secondary | ICD-10-CM | POA: Diagnosis not present

## 2015-07-21 DIAGNOSIS — Z6838 Body mass index (BMI) 38.0-38.9, adult: Secondary | ICD-10-CM | POA: Diagnosis not present

## 2015-07-21 DIAGNOSIS — Z23 Encounter for immunization: Secondary | ICD-10-CM | POA: Diagnosis not present

## 2015-07-23 ENCOUNTER — Ambulatory Visit (HOSPITAL_COMMUNITY): Payer: Medicare Other | Admitting: Physical Therapy

## 2015-07-23 DIAGNOSIS — R2681 Unsteadiness on feet: Secondary | ICD-10-CM | POA: Diagnosis not present

## 2015-07-23 DIAGNOSIS — R262 Difficulty in walking, not elsewhere classified: Secondary | ICD-10-CM

## 2015-07-23 DIAGNOSIS — S91109D Unspecified open wound of unspecified toe(s) without damage to nail, subsequent encounter: Secondary | ICD-10-CM | POA: Diagnosis not present

## 2015-07-23 NOTE — Therapy (Signed)
[place], [name], [address] Phone: [phone]   Fax:  [phone]  Wound Care Therapy  Patient Details  Name: [name] MRN: [id number] Date of Birth: 07-Nov-1945 No Data Recorded  Encounter Date: 07/23/2015      PT End of Session - 07/23/15 1205    Visit Number 27   Number of Visits 30   Date for PT Re-Evaluation 07/21/15   Authorization Type UHC Medicare    Authorization Time Period 04/22/15 to 06/21/05   Authorization - Visit Number 46   Authorization - Number of Visits 30   PT Start Time 0845   PT Stop Time 0915   PT Time Calculation (min) 30 min   Activity Tolerance Patient tolerated treatment well   Behavior During Therapy [facility] for tasks assessed/performed      Past Medical History  Diagnosis Date  . Hypertension   . Diabetes mellitus     type 2   . History of valve replacement 2010    [facility] Ease bioprosthetic 13mm  . Thyroid disease   . Stroke [facility]) 2003    R-sided weakness & upper extremity swelling   . Coronary artery disease   . Dyslipidemia   . At risk for sudden cardiac death 06/19/2013  . PAF (paroxysmal atrial fibrillation) ([place]) 05/2013    converted with amiodarone to SR  . Chronic anticoagulation 05/2013    started  . S/P cardiac catheterization, Rt & Lt heart cath 06/05/2013 06/19/13  . S/P CABG x 3 2010  . GERD (gastroesophageal reflux disease)   . Cataract   . Chronic kidney disease     Patient reports he is seeing Kidney doctor in September    Past Surgical History  Procedure Laterality Date  . Coronary artery bypass graft  01/26/2009    LIMA to LAD, SVG to circumflex, SVG to PDA  . Aortic valve replacement  01/26/2009    [facility] Ease bioprosthetic 12mm valve  . Transthoracic echocardiogram  09/2011    grade 1 diastolic dysfunction; increasing valve gradient; calcified MV annulus   . Cardiac catheterization  06/05/2013    native 3 vessel disease;  patent LIMA to LAD, SVG to OM and SVG to PDA; Elevated right and left heart filling pressures, with predominantly pulmonary venous hypertension, normal PVR  . Transthoracic echocardiogram  06/03/2013    EF 25-30%, mod conc. hypertrophy, grade 3 diastolic dysfunction; LA mod dilated; calcified MV annulus; transaortic gradients are normal for the bioprosthetic valve; inf vena cava dilated (elevated CVP) - LifeVest  . Left and right heart catheterization with coronary angiogram N/A 06/05/2013    Procedure: LEFT AND RIGHT HEART CATHETERIZATION WITH CORONARY ANGIOGRAM;  Surgeon: Leonie Man, MD;  Location: [facility] CATH LAB;  Service: Cardiovascular;  Laterality: N/A;  . Graft(s) angiogram  06/05/2013    Procedure: GRAFT(S) Cyril Loosen;  Surgeon: Leonie Man, MD;  Location: [facility] CATH LAB;  Service: Cardiovascular;;    There were no vitals filed for this visit.  Visit Diagnosis:  Open toe wound, subsequent encounter  Difficulty walking  Unsteadiness                 Wound Therapy - 07/23/15 1203    Subjective Pt reports no pain.   Patient and Family Stated Goals wound to heal    Pain Assessment No/denies pain   Wound Properties Date First Assessed: 06/23/15 Time First Assessed: 0900 Wound Type: Other (Comment) Location: Toe (Comment  which one) Location Orientation: Right Wound Description (Comments): Lateral aspect of Rt great toe Present on Admission: No   Dressing Type Impregnated gauze (bismuth)   Dressing Changed Changed   Dressing Status Intact   Dressing Change Frequency Other (Comment)   Site / Wound Assessment Red;Yellow   % Wound base Red or Granulating 80%   % Wound base Yellow 20%   Peri-wound Assessment Intact   Margins Attached edges (approximated)   Drainage Amount Minimal   Drainage Description Serosanguineous   Treatment Cleansed;Debridement (Selective)   Wound Properties Date First Assessed: 04/22/15 Time First Assessed: 1020 Wound Type: Other (Comment) , open  wound   Location: Toe (Comment  which one) , R great toe   Location Orientation: Right Present on Admission: Yes   Dressing Type Impregnated gauze (bismuth)   Dressing Changed Changed   Dressing Status Old drainage   Dressing Change Frequency Other (Comment)   Site / Wound Assessment Granulation tissue;Yellow   % Wound base Red or Granulating 100%   % Wound base Yellow 0%   Peri-wound Assessment Intact   Margins Attached edges (approximated)   Closure Approximated   Drainage Amount None   Treatment Cleansed;Debridement (Selective)   Incision Properties Date First Assessed: 04/23/15 Time First Assessed: 1738 Location: Groin Location Orientation: Left Present on Admission: No   Selective Debridement - Location R great toe    Selective Debridement - Tools Used Forceps;Scalpel   Selective Debridement - Tissue Removed Slough and dry skin, callous edges   Wound Therapy - Clinical Statement Wound on tip of toe now 100% healed.  Cleansed and removed dry skin and moisturized with xeroform dressing.  Lateral wound with increased approximation and decreased slough.  Progressing well with no signs/symptoms of infection.   Wound Therapy - Functional Problem List non-healing wound complicated with DM and vascular compromise.    Factors Delaying/Impairing Wound Healing Altered sensation;Diabetes Mellitus;Infection - systemic/local;Multiple medical problems;Vascular compromise   Wound Therapy - Frequency Other (comment)   Wound Therapy - Current Recommendations Diabetic teaching;PT   Wound Plan Continue with current PT POC with approximate dressings   Dressing  xeroform,  vaseline to perimeter, and gauze   Dressing xeroform on both areas now, 2x2, gauze                              Problem List Patient Active Problem List   Diagnosis Date Noted  . PVD (peripheral vascular disease) ([place])   . S/P angioplasty 04/23/2015  . Peripheral arterial occlusive disease ([place]) 04/23/2015   . PAD (peripheral artery disease) ([place])   . Critical lower limb ischemia   . Toe ulcer, right ([place])   . Type II or unspecified type diabetes mellitus without mention of complication, uncontrolled 11/27/2013  . Acute respiratory failure with hypoxia ([place]) 11/26/2013  . CAP (community acquired pneumonia) 11/26/2013  . CHF exacerbation ([place]) 11/25/2013  . Cardiomyopathy, ischemic 06/13/2013  . At risk for sudden cardiac death Jun 12, 2013  . S/P cardiac catheterization, Rt & Lt heart cath 06/05/2013 Jun 12, 2013  . Acute on chronic combined systolic and diastolic HF (heart failure), NYHA class 3 ([place]) 06/04/2013  . Atrial flutter with rapid ventricular response ([place]) 06/02/2013  . Acute renal failure: Cr up to 1.8 06/02/2013  . Hyperkalemia 06/02/2013    Class: Acute  . LBBB (left bundle branch block) 06/01/2013  . Hypotension- secondary to AF and Diltiazem Rx 06/01/2013  . Obesity (BMI 30-39.9) 06/01/2013  .  Acute on chronic diastolic heart failure - due to Afib AVR 06/01/2013  . Atrial fibrillation with RVR- converted with Amiodarone 05/31/2013  . Stroke- Lt brain 2003 04/08/2013  . Hemiplegia affecting right dominant side ([place]) 04/08/2013  . Cataract 04/08/2013  . GERD (gastroesophageal reflux disease) 04/08/2013  . S/P CABG x 3 - 2010 04/08/2013  . S/P AVR-tissue, 2010 04/08/2013  . DM2 (diabetes mellitus, type 2) ([place]) 04/08/2013  . Dyslipidemia 04/08/2013  . SUBACROMIAL BURSITIS, LEFT 11/05/2009    Teena Irani, PTA/CLT [phone]  07/23/2015, 12:06 PM  [place] [address] [place], [name], [address] Phone: [phone]   Fax:  [phone]  Name: ZAEDEN SELDERS MRN: [id number] Date of Birth: 09/21/1945

## 2015-07-27 ENCOUNTER — Other Ambulatory Visit: Payer: Self-pay

## 2015-07-27 NOTE — Patient Outreach (Signed)
[place]) Care Management  [place]  07/27/2015   [name] 1946-06-05 [id number]  Subjective: Telephone call to patient for monthly call.  Patient reports he is doing ok.  He reports that his right great toe wound almost healed.  Patient reports he is still going to the wound clinic twice a week.  Patient reports that his wound his about the size of a dime now.  Patient reports he is on the top of list for apartment.  He states he turned down an apartment because it was on the second floor.    Diabetes:  Patient reports his blood sugar this morning was 161.  Patient reports highest blood sugar was 300.  Patient reports he tries to keep his sugar less than 250.  Discussed with patient diabetic diet and limiting carbohydrates and sugars.  He verbalized understanding.     Objective:   Current Medications:  Current Outpatient Prescriptions  Medication Sig Dispense Refill  . acetaminophen (TYLENOL) 500 MG tablet Take 1,000 mg by mouth every 6 (six) hours as needed. For pain/headaches     . ALPRAZolam (XANAX) 1 MG tablet Take 1 mg by mouth 4 (four) times daily as needed for anxiety.     Marland Kitchen amiodarone (PACERONE) 200 MG tablet TAKE ONE (1) TABLET EACH DAY 120 tablet 2  . apixaban (ELIQUIS) 5 MG TABS tablet Take 1 tablet (5 mg total) by mouth 2 (two) times daily. 180 tablet 1  . atorvastatin (LIPITOR) 20 MG tablet Take 1 tablet (20 mg total) by mouth daily at 6 PM. 90 tablet 1  . bismuth subsalicylate (PEPTO BISMOL) 262 MG/15ML suspension Take 30 mLs by mouth every 6 (six) hours as needed for indigestion.    . celecoxib (CELEBREX) 200 MG capsule Take 1 capsule by mouth 2 (two) times daily.    . clopidogrel (PLAVIX) 75 MG tablet Take 1 tablet (75 mg total) by mouth daily. 30 tablet 3  . docusate sodium (COLACE) 100 MG capsule Take 100 mg by mouth 2 (two) times daily.      Marland Kitchen doxycycline (VIBRA-TABS) 100 MG tablet Take 100 mg by mouth 2 (two) times daily.    .  fenofibrate (TRICOR) 145 MG tablet TAKE ONE (1) TABLET BY MOUTH EVERY DAY 30 tablet 6  . ferrous sulfate 325 (65 FE) MG EC tablet Take 325 mg by mouth every morning.    . gabapentin (NEURONTIN) 300 MG capsule Take 300 mg by mouth 2 (two) times daily.     Marland Kitchen HYDROcodone-acetaminophen (NORCO) 10-325 MG per tablet Take 1 tablet by mouth as needed for severe pain.     Marland Kitchen insulin glargine (LANTUS) 100 UNIT/ML injection Inject 60-70 Units into the skin 2 (two) times daily. Takes70 units in the morning and 60 units in the evening    . insulin lispro (HUMALOG) 100 UNIT/ML injection Inject 50-60 Units into the skin 3 (three) times daily before meals. 50U BID and 60U QHS    . levothyroxine (SYNTHROID, LEVOTHROID) 25 MCG tablet Take 25 mcg by mouth daily.      . metoprolol succinate (TOPROL-XL) 25 MG 24 hr tablet Take 1 tablet (25 mg total) by mouth daily. 90 tablet 1  . Omega-3 Fatty Acids (FISH OIL PO) Take 1 capsule by mouth 2 (two) times daily.     . pantoprazole (PROTONIX) 40 MG tablet Take 1 tablet (40 mg total) by mouth 2 (two) times daily. 180 tablet 1  . silver sulfADIAZINE (SILVADENE) 1 %  cream Apply 1 application topically 4 (four) times daily.    Marland Kitchen torsemide (DEMADEX) 10 MG tablet Take 1 tablet (10 mg total) by mouth daily. 30 tablet 11   No current facility-administered medications for this visit.    Functional Status:  In your present state of health, do you have any difficulty performing the following activities: 06/29/2015 06/04/2015  Hearing? N N  Vision? N -  Difficulty concentrating or making decisions? N Y  Walking or climbing stairs? N N  Dressing or bathing? N N  Doing errands, shopping? N N  Preparing Food and eating ? N N  Using the Toilet? N N  In the past six months, have you accidently leaked urine? N N  Do you have problems with loss of bowel control? N N  Managing your Medications? N N  Managing your Finances? N N  Housekeeping or managing your Housekeeping? [name]     Fall/Depression Screening: PHQ 2/9 Scores 07/27/2015 06/29/2015 06/04/2015 04/29/2015 02/18/2015 02/11/2015  PHQ - 2 Score 0 0 1 1 2 2   PHQ- 9 Score - - - - 2 7    Assessment: Patient continues to benefit from health coach outreach for disease management.  Plan:  [facility] CM Care Plan Problem Two        Most Recent Value   Care Plan Problem Two  Uncontrolled diabetes as evidenced by elevated CBGs, HGA1C and nonhealing wound.   Role Documenting the Problem Two  [place] for Problem Two  Active   Interventions for Problem Two Long Term Goal   RN Health Coach reviewed with patient signs of wound infection and importance of protein in his diet to promote healing.    THN Long Term Goal (31-90) days  Patient stated goal "I want my foot to heal", Patient foot will show improvement to wound over next 90 days   THN Long Term Goal Start Date  06/03/15 [Goal continued]   THN CM Short Term Goal #3 (0-30 days)  Patient CBG's will be under better control over next 30 days   THN CM Short Term Goal #3 Start Date  07/27/15 [goal continued]   Interventions for Short Term Goal #3  [place] discussed with patient the importance of limiting carbohydrates and sweets.  Also discussed goal of keeping sugars less than 250.        RN Health coach will contact patient within one month and patient agrees to next outreach.  [name], RN, MSN [place] [phone]

## 2015-07-28 ENCOUNTER — Ambulatory Visit (INDEPENDENT_AMBULATORY_CARE_PROVIDER_SITE_OTHER): Payer: Medicare Other | Admitting: Gastroenterology

## 2015-07-28 ENCOUNTER — Other Ambulatory Visit (HOSPITAL_COMMUNITY): Payer: Self-pay | Admitting: Interventional Radiology

## 2015-07-28 ENCOUNTER — Encounter: Payer: Self-pay | Admitting: Gastroenterology

## 2015-07-28 ENCOUNTER — Ambulatory Visit (HOSPITAL_COMMUNITY): Payer: Medicare Other

## 2015-07-28 VITALS — BP 161/67 | HR 70 | Temp 96.6°F | Ht 70.0 in | Wt 258.6 lb

## 2015-07-28 DIAGNOSIS — R2681 Unsteadiness on feet: Secondary | ICD-10-CM | POA: Diagnosis not present

## 2015-07-28 DIAGNOSIS — S91109D Unspecified open wound of unspecified toe(s) without damage to nail, subsequent encounter: Secondary | ICD-10-CM

## 2015-07-28 DIAGNOSIS — R262 Difficulty in walking, not elsewhere classified: Secondary | ICD-10-CM | POA: Diagnosis not present

## 2015-07-28 DIAGNOSIS — I739 Peripheral vascular disease, unspecified: Secondary | ICD-10-CM

## 2015-07-28 DIAGNOSIS — K219 Gastro-esophageal reflux disease without esophagitis: Secondary | ICD-10-CM | POA: Diagnosis not present

## 2015-07-28 DIAGNOSIS — Z7901 Long term (current) use of anticoagulants: Secondary | ICD-10-CM | POA: Diagnosis not present

## 2015-07-28 DIAGNOSIS — K625 Hemorrhage of anus and rectum: Secondary | ICD-10-CM | POA: Insufficient documentation

## 2015-07-28 MED ORDER — HYDROCORTISONE 2.5 % RE CREA: 1.0000 "application " | TOPICAL_CREAM | Freq: Two times a day (BID) | RECTAL | Status: DC

## 2015-07-28 NOTE — Therapy (Signed)
[place], [name], [address] Phone: [phone]   Fax:  [phone]  Wound Care Therapy (reassessment/measurement)  Patient Details  Name: [name] MRN: [id number] Date of Birth: Nov 18, 1945 No Data Recorded  Encounter Date: 07/28/2015      PT End of Session - 07/28/15 1112    Visit Number 28   Number of Visits 36   Date for PT Re-Evaluation 08/24/14   Authorization Type [employer] Medicare    Authorization Time Period 04/22/15 to 06/21/05   Authorization - Visit Number 28   Authorization - Number of Visits 67   PT Start Time 1020   PT Stop Time 1055   PT Time Calculation (min) 35 min   Activity Tolerance Patient tolerated treatment well   Behavior During Therapy [facility] for tasks assessed/performed      Past Medical History  Diagnosis Date  . Hypertension   . Diabetes mellitus     type 2   . History of valve replacement 2010    [facility] Ease bioprosthetic 29mm  . Thyroid disease   . Stroke [facility]) 2003    R-sided weakness & upper extremity swelling   . Coronary artery disease   . Dyslipidemia   . At risk for sudden cardiac death 06-08-2013  . PAF (paroxysmal atrial fibrillation) ([place]) 05/2013    converted with amiodarone to SR  . Chronic anticoagulation 05/2013    started  . S/P cardiac catheterization, Rt & Lt heart cath 06/05/2013 06/08/13  . S/P CABG x 3 2010  . GERD (gastroesophageal reflux disease)   . Cataract   . Chronic kidney disease     Patient reports he is seeing Kidney doctor in September    Past Surgical History  Procedure Laterality Date  . Coronary artery bypass graft  01/26/2009    LIMA to LAD, SVG to circumflex, SVG to PDA  . Aortic valve replacement  01/26/2009    [facility] Ease bioprosthetic 104mm valve  . Transthoracic echocardiogram  09/2011    grade 1 diastolic dysfunction; increasing valve gradient; calcified MV annulus   . Cardiac catheterization  06/05/2013     native 3 vessel disease; patent LIMA to LAD, SVG to OM and SVG to PDA; Elevated right and left heart filling pressures, with predominantly pulmonary venous hypertension, normal PVR  . Transthoracic echocardiogram  06/03/2013    EF 25-30%, mod conc. hypertrophy, grade 3 diastolic dysfunction; LA mod dilated; calcified MV annulus; transaortic gradients are normal for the bioprosthetic valve; inf vena cava dilated (elevated CVP) - LifeVest  . Left and right heart catheterization with coronary angiogram N/A 06/05/2013    Procedure: LEFT AND RIGHT HEART CATHETERIZATION WITH CORONARY ANGIOGRAM;  Surgeon: Leonie Man, MD;  Location: [facility] CATH LAB;  Service: Cardiovascular;  Laterality: N/A;  . Graft(s) angiogram  06/05/2013    Procedure: GRAFT(S) Cyril Loosen;  Surgeon: Leonie Man, MD;  Location: [facility] CATH LAB;  Service: Cardiovascular;;    There were no vitals filed for this visit.  Visit Diagnosis:  Open toe wound, subsequent encounter  Difficulty walking  Unsteadiness      Subjective Assessment - 07/28/15 1057    Subjective No reports of pain, dressings are intact with blood draining through lateral aspect of great toe.   Currently in Pain? No/denies           Wound Therapy - 07/28/15 1057    Subjective No reports of pain, dressings are intact with blood draining  through lateral aspect of great toe.   Patient and Family Stated Goals wound to heal    Pain Assessment No/denies pain   Wound Properties Date First Assessed: 06/23/15 Time First Assessed: 0900 Wound Type: Other (Comment) Location: Toe (Comment  which one) Location Orientation: Right Wound Description (Comments): Lateral aspect of Rt great toe Present on Admission: No   Dressing Type Impregnated gauze (petrolatum);Gauze (Comment)   Dressing Changed Changed   Dressing Status Intact   Dressing Change Frequency Other (Comment)   Site / Wound Assessment Red;Yellow   % Wound base Red or Granulating 80%   % Wound base  Yellow 20%   Peri-wound Assessment Intact   Wound Length (cm) 1.3 cm   Wound Width (cm) 0.7 cm   Wound Depth (cm) 0 cm   Margins Attached edges (approximated)   Drainage Amount Minimal   Drainage Description Serosanguineous   Treatment Cleansed;Debridement (Selective)   Wound Properties Date First Assessed: 04/22/15 Time First Assessed: 1020 Wound Type: Other (Comment) , open wound   Location: Toe (Comment  which one) , R great toe   Location Orientation: Right Present on Admission: Yes   Dressing Type Impregnated gauze (bismuth)   Dressing Changed Changed   Dressing Status Old drainage   Site / Wound Assessment Granulation tissue;Yellow   % Wound base Red or Granulating 100%   Peri-wound Assessment Intact   Margins Attached edges (approximated)   Closure Approximated   Drainage Amount None   Treatment Cleansed  no debridement necessary   Incision Properties Date First Assessed: 04/23/15 Time First Assessed: 1738 Location: Groin Location Orientation: Left Present on Admission: No   Selective Debridement - Location R great toe    Selective Debridement - Tools Used Forceps;Scalpel   Selective Debridement - Tissue Removed Slough and dry skin, callous edges   Wound Therapy - Clinical Statement Distal toe wound 100% fully granulated, lateral wound countinues to have adhesive slough to center of wound with noted dryness to wound bed.  Measurement taken this session.  Overall improvements with reduction in slough and decreased in length and width.     Wound Therapy - Functional Problem List non-healing wound complicated with DM and vascular compromise.    Factors Delaying/Impairing Wound Healing Altered sensation;Diabetes Mellitus;Infection - systemic/local;Multiple medical problems;Vascular compromise   Wound Therapy - Frequency Other (comment)   Wound Therapy - Current Recommendations Diabetic teaching;PT   Wound Plan Pt may benefit from continued skilled wound care until fully healed or  able to discharge for self care when appropriate    Dressing  xeroform,  vaseline to perimeter, and gauze   Dressing xeroform on both areas now, 2x2, gauze           Problem List Patient Active Problem List   Diagnosis Date Noted  . Rectal bleeding 07/28/2015  . Chronic anticoagulation 07/28/2015  . PVD (peripheral vascular disease) ([place])   . S/P angioplasty 04/23/2015  . Peripheral arterial occlusive disease ([place]) 04/23/2015  . PAD (peripheral artery disease) ([place])   . Critical lower limb ischemia   . Toe ulcer, right ([place])   . Type II or unspecified type diabetes mellitus without mention of complication, uncontrolled 11/27/2013  . Acute respiratory failure with hypoxia ([place]) 11/26/2013  . CAP (community acquired pneumonia) 11/26/2013  . CHF exacerbation ([place]) 11/25/2013  . Cardiomyopathy, ischemic 06/13/2013  . At risk for sudden cardiac death 01-Jul-2013  . S/P cardiac catheterization, Rt & Lt heart cath 06/05/2013 2013/07/01  . Acute on chronic combined  systolic and diastolic HF (heart failure), NYHA class 3 ([place]) 06/04/2013  . Atrial flutter with rapid ventricular response ([place]) 06/02/2013  . Acute renal failure: Cr up to 1.8 06/02/2013  . Hyperkalemia 06/02/2013    Class: Acute  . LBBB (left bundle branch block) 06/01/2013  . Hypotension- secondary to AF and Diltiazem Rx 06/01/2013  . Obesity (BMI 30-39.9) 06/01/2013  . Acute on chronic diastolic heart failure - due to Afib AVR 06/01/2013  . Atrial fibrillation with RVR- converted with Amiodarone 05/31/2013  . Stroke- Lt brain 2003 04/08/2013  . Hemiplegia affecting right dominant side ([place]) 04/08/2013  . Cataract 04/08/2013  . GERD (gastroesophageal reflux disease) 04/08/2013  . S/P CABG x 3 - 2010 04/08/2013  . S/P AVR-tissue, 2010 04/08/2013  . DM2 (diabetes mellitus, type 2) ([place]) 04/08/2013  . Dyslipidemia 04/08/2013  . SUBACROMIAL BURSITIS, LEFT 11/05/2009   Ihor Austin, LPTA; Delana Meyer [phone]       G-Codes - Aug 12, 2015 1451    Functional Assessment Tool Used Based on skilled clinical assessment of wound healing status, gait/mobility    Functional Limitation Other PT primary   Other PT Primary Current Status [phone]) At least 40 percent but less than 60 percent impaired, limited or restricted   Other PT Primary Goal Status [id number]) At least 20 percent but less than 40 percent impaired, limited or restricted      Physical Therapy Progress Note  Dates of Reporting Period: 06/23/15 to 08-12-2015  Objective Reports of Subjective Statement: see above   Objective Measurements: see above   Goal Update: see above   Plan: see above   Reason Skilled Services are Required: continue for full wound healing and education on self care/self-management     Deniece Ree PT, DPT [place], [name], [address] Phone: [phone]   Fax:  [phone]  Name: [name] MRN: [id number] Date of Birth: 14-Nov-1945

## 2015-07-28 NOTE — Patient Instructions (Signed)
1. Apply proctosol cream just inside rectum twice daily for 10 days.  2. I will review records and discuss need for possible colonoscopy with Dr. Debara Pickett. Further recommendations to follow.

## 2015-07-28 NOTE — Progress Notes (Signed)
Primary Care Physician:  [name]., MD  Primary Gastroenterologist:  Garfield Cornea, MD   Chief Complaint  Patient presents with  . OTHER    Blood on tissue after BM's/ rectal itching    HPI:  [name] is a 69 y.o. male with h/o CAD s/p CABG in 2010, bioprosthetic aoritc valve replacement in 2010, h/o stroke with right-sided hemiparesis remotely, Afib with RMR spontaneously converted with amiodarone, chronic Eliquis, who presents for further evaluation of brbpr, rectal itching at the request of Dr. Debara Pickett. Several week history of bright red blood per rectum on the toilet tissue. Associated itching. Bowel movements regular. No straining. No abdominal pain. No known hemorrhoids. Denies family history of colon cancer. History of refractory heartburn, a lot better now on pantoprazole twice a day, increased by his cardiologist. Occasional nausea. No dysphagia.  Patient underwent angioplasty and stenting of the popliteal and superficial femoral arteries in September 2016. According to IR notation, Plavix planned for at least 3 to 6 months. This is in addition to his request.     Retrieve records of last colonoscopy dated 2007 by Dr. Aviva Signs. Mall nonbleeding internal hemorrhoids, otherwise unremarkable. Quality of prep was adequate.  Current Outpatient Prescriptions  Medication Sig Dispense Refill  . acetaminophen (TYLENOL) 500 MG tablet Take 1,000 mg by mouth every 6 (six) hours as needed. For pain/headaches     . ALPRAZolam (XANAX) 1 MG tablet Take 1 mg by mouth 4 (four) times daily as needed for anxiety.     Marland Kitchen amiodarone (PACERONE) 200 MG tablet TAKE ONE (1) TABLET EACH DAY 120 tablet 2  . apixaban (ELIQUIS) 5 MG TABS tablet Take 1 tablet (5 mg total) by mouth 2 (two) times daily. 180 tablet 1  . atorvastatin (LIPITOR) 20 MG tablet Take 1 tablet (20 mg total) by mouth daily at 6 PM. 90 tablet 1  . bismuth subsalicylate (PEPTO BISMOL) 262 MG/15ML suspension Take 30 mLs by  mouth every 6 (six) hours as needed for indigestion.    . celecoxib (CELEBREX) 200 MG capsule Take 1 capsule by mouth 2 (two) times daily.    . clopidogrel (PLAVIX) 75 MG tablet Take 1 tablet (75 mg total) by mouth daily. 30 tablet 3  . docusate sodium (COLACE) 100 MG capsule Take 100 mg by mouth 2 (two) times daily.      Marland Kitchen doxycycline (VIBRA-TABS) 100 MG tablet Take 100 mg by mouth 2 (two) times daily.    . fenofibrate (TRICOR) 145 MG tablet TAKE ONE (1) TABLET BY MOUTH EVERY DAY 30 tablet 6  . ferrous sulfate 325 (65 FE) MG EC tablet Take 325 mg by mouth every morning.    . gabapentin (NEURONTIN) 300 MG capsule Take 300 mg by mouth 2 (two) times daily.     Marland Kitchen HYDROcodone-acetaminophen (NORCO) 10-325 MG per tablet Take 1 tablet by mouth as needed for severe pain.     Marland Kitchen insulin glargine (LANTUS) 100 UNIT/ML injection Inject 60-70 Units into the skin 2 (two) times daily. Takes70 units in the morning and 60 units in the evening    . insulin lispro (HUMALOG) 100 UNIT/ML injection Inject 50-60 Units into the skin 3 (three) times daily before meals. 50U BID and 60U QHS    . levothyroxine (SYNTHROID, LEVOTHROID) 25 MCG tablet Take 25 mcg by mouth daily.      . metoprolol succinate (TOPROL-XL) 25 MG 24 hr tablet Take 1 tablet (25 mg total) by mouth daily. 90 tablet 1  .  Omega-3 Fatty Acids (FISH OIL PO) Take 1 capsule by mouth 2 (two) times daily.     . pantoprazole (PROTONIX) 40 MG tablet Take 1 tablet (40 mg total) by mouth 2 (two) times daily. 180 tablet 1  . silver sulfADIAZINE (SILVADENE) 1 % cream Apply 1 application topically 4 (four) times daily.    Marland Kitchen torsemide (DEMADEX) 10 MG tablet Take 1 tablet (10 mg total) by mouth daily. 30 tablet 11  . hydrocortisone (PROCTOSOL HC) 2.5 % rectal cream Place 1 application rectally 2 (two) times daily. For 10 days 30 g 0   No current facility-administered medications for this visit.    Allergies as of 07/28/2015  . (No Known Allergies)    Past Medical  History  Diagnosis Date  . Hypertension   . Diabetes mellitus     type 2   . History of valve replacement 2010    [facility] Ease bioprosthetic 37mm  . Thyroid disease   . Stroke [facility]) 2003    R-sided weakness & upper extremity swelling   . Coronary artery disease   . Dyslipidemia   . At risk for sudden cardiac death 06/29/13  . PAF (paroxysmal atrial fibrillation) ([place]) 05/2013    converted with amiodarone to SR  . Chronic anticoagulation 05/2013    started  . S/P cardiac catheterization, Rt & Lt heart cath 06/05/2013 06/29/2013  . S/P CABG x 3 2010  . GERD (gastroesophageal reflux disease)   . Cataract   . Chronic kidney disease     Patient reports he is seeing Kidney doctor in September    Past Surgical History  Procedure Laterality Date  . Coronary artery bypass graft  01/26/2009    LIMA to LAD, SVG to circumflex, SVG to PDA  . Aortic valve replacement  01/26/2009    [facility] Ease bioprosthetic 74mm valve  . Transthoracic echocardiogram  09/2011    grade 1 diastolic dysfunction; increasing valve gradient; calcified MV annulus   . Cardiac catheterization  06/05/2013    native 3 vessel disease; patent LIMA to LAD, SVG to OM and SVG to PDA; Elevated right and left heart filling pressures, with predominantly pulmonary venous hypertension, normal PVR  . Transthoracic echocardiogram  06/03/2013    EF 25-30%, mod conc. hypertrophy, grade 3 diastolic dysfunction; LA mod dilated; calcified MV annulus; transaortic gradients are normal for the bioprosthetic valve; inf vena cava dilated (elevated CVP) - LifeVest  . Left and right heart catheterization with coronary angiogram N/A 06/05/2013    Procedure: LEFT AND RIGHT HEART CATHETERIZATION WITH CORONARY ANGIOGRAM;  Surgeon: Leonie Man, MD;  Location[facility] CATH LAB;  Service: Cardiovascular;  Laterality: N/A;  . Graft(s) angiogram  06/05/2013    Procedure: GRAFT(S) Cyril Loosen;  Surgeon: Leonie Man, MD;  Location: [facility] CATH  LAB;  Service: Cardiovascular;;  . Colonoscopy  2007    Dr. Aviva Signs: internal hemorrhoids    Family History  Problem Relation Age of Onset  . Heart attack Mother   . Liver disease Brother   . Diabetes Sister     x4    Social History   Social History  . Marital Status: Divorced    Spouse Name: N/A  . Number of Children: 4  . Years of Education: N/A   Occupational History  . 4    Social History Main Topics  . Smoking status: Former Smoker    Types: Cigarettes    Quit date: 08/15/2001  . Smokeless tobacco: Former Systems developer  Types: Chew     Comment: Quit in 2003  . Alcohol Use: No  . Drug Use: No  . Sexual Activity: Not Currently   Other Topics Concern  . Not on file   Social History Narrative      ROS:  General: Negative for anorexia, weight loss, fever, chills, fatigue, weakness. Eyes: Negative for vision changes.  ENT: Negative for hoarseness, difficulty swallowing , nasal congestion. CV: Negative for chest pain, angina, palpitations, dyspnea on exertion, peripheral edema.  Respiratory: Negative for dyspnea at rest, dyspnea on exertion, cough, sputum, wheezing.  GI: See history of present illness. GU:  Negative for dysuria, hematuria, urinary incontinence, urinary frequency, nocturnal urination.  MS: Negative for joint pain, low back pain.  Derm: Negative for rash or itching.  Neuro: Negative for weakness, abnormal sensation, seizure, frequent headaches, memory loss, confusion.  Psych: Negative for anxiety, depression, suicidal ideation, hallucinations.  Endo: Negative for unusual weight change.  Heme: Negative for bruising or bleeding. Allergy: Negative for rash or hives.    Physical Examination:  BP 161/67 mmHg  Pulse 70  Temp(Src) 96.6 F (35.9 C) (Oral)  Ht 5\' 10"  (1.778 m)  Wt 258 lb 9.6 oz (117.3 kg)  BMI 37.11 kg/m2   General: Well-nourished, well-developed in no acute distress.  Head: Normocephalic, atraumatic.   Eyes: Conjunctiva pink,  no icterus. Mouth: Oropharyngeal mucosa moist and pink , no lesions erythema or exudate. Neck: Supple without thyromegaly, masses, or lymphadenopathy.  Lungs: Clear to auscultation bilaterally.  Heart: Regular rate and rhythm, no murmurs rubs or gallops.  Abdomen: Bowel sounds are normal, nontender, nondistended, no hepatosplenomegaly or masses, no abdominal bruits or    hernia , no rebound or guarding.   Rectal: patient declined Extremities: No lower extremity edema. No clubbing or deformities.  Neuro: Alert and oriented x 4 , grossly normal neurologically.  Skin: Warm and dry, no rash or jaundice.   Psych: Alert and cooperative, normal mood and affect.  Labs: Lab Results  Component Value Date   WBC 7.0 04/23/2015   HGB 13.8 04/23/2015   HCT 41.5 04/23/2015   MCV 81.2 04/23/2015   PLT 160 04/23/2015   Lab Results  Component Value Date   CREATININE 1.38* 06/29/2015   BUN 23 06/29/2015   NA 141 06/29/2015   K 4.3 06/29/2015   CL 100 06/29/2015   CO2 28 06/29/2015   Lab Results  Component Value Date   ALT 50* 06/29/2015   AST 32 06/29/2015   ALKPHOS 50 06/29/2015   BILITOT 0.4 06/29/2015     Imaging Studies: US Arterial Seg Multiple  07/08/2015  CLINICAL DATA:  69 year old male with a history of right great toe ulcer, with pre operative right sided ABI of 0.42 at rest. Preoperative left-sided ABI 0.55 arrest. These were measured 03/02/2015. Patient underwent revascularization of chronic total occlusion of the right superficial femoral artery with angioplasty and stenting. EXAM: NONINVASIVE PHYSIOLOGIC VASCULAR STUDY OF BILATERAL LOWER EXTREMITIES TECHNIQUE: Evaluation of both lower extremities was performed at rest, including calculation of ankle-brachial indices, multiple segmental pressure evaluation, segmental Doppler and segmental pulse volume recording. COMPARISON:  03/02/2015, CT 03/25/2015, angiogram 04/23/2015 Noninvasive 05/19/2015 FINDINGS: Right: Resting ankle  brachial index:  0.93 Segmental blood pressure: Right brachial pressure measures 135 Doppler: Directed duplex of the right lower extremity demonstrates biphasic waveform of the common femoral artery. Monophasic waveform of the superficial femoral artery and popliteal artery. Right SFA and popliteal stent system is patent. Posterior tibial artery with monophasic signal.  Pulse volume recording: Segmental PVR of the right lower extremity demonstrates subtle decrease in the quality of the waveform at the high thigh and calf. Augmentation is maintained from high thigh to below knee. Deterioration of the waveform at ankle and digital segment. Left: Resting ankle brachial index: 0.77 Segmental blood pressure: Left brachial pressure measures 137 Doppler: Segmental Doppler of the left lower extremity demonstrates biphasic waveform of the common femoral artery. Monophasic waveform of the popliteal artery, posterior tibial artery, and dorsalis pedis. Pulse volume recording: Segmental PVR of the left lower extremity demonstrates maintained waveform in the high thigh and low thigh with augmentation maintained from high thigh to below knee. Deterioration of the waveform at the ankle and the left great toe. Additional: IMPRESSION: Right: Resting ankle-brachial index on the right in the normal range, with no evidence of significant arterial occlusive disease. Remainder of the study indicates patent right superficial femoral artery/ popliteal stent system, with persisting atherosclerotic changes and abnormal arterial waveform of the tibial vessels. Left: Resting ankle-brachial index on the left within the mild range of arterial occlusive disease. Remainder of the study demonstrates atherosclerotic changes of the left lower extremity, worst within the tibial vessels. Signed, Dulcy Fanny. Earleen Newport, DO [facility] Radiology Electronically Signed   By: Corrie Mckusick D.O.   On: 07/08/2015 15:32    [place] Lower Ext Art Right  07/08/2015  CLINICAL DATA:  69 year old male with a history of right great toe ulcer, with pre operative right sided ABI of 0.42 at rest. Preoperative left-sided ABI 0.55 arrest. These were measured 03/02/2015. Patient underwent revascularization of chronic total occlusion of the right superficial femoral artery with angioplasty and stenting. EXAM: NONINVASIVE PHYSIOLOGIC VASCULAR STUDY OF BILATERAL LOWER EXTREMITIES TECHNIQUE: Evaluation of both lower extremities was performed at rest, including calculation of ankle-brachial indices, multiple segmental pressure evaluation, segmental Doppler and segmental pulse volume recording. COMPARISON:  03/02/2015, CT 03/25/2015, angiogram 04/23/2015 Noninvasive 05/19/2015 FINDINGS: Right: Resting ankle brachial index:  0.93 Segmental blood pressure: Right brachial pressure measures 135 Doppler: Directed duplex of the right lower extremity demonstrates biphasic waveform of the common femoral artery. Monophasic waveform of the superficial femoral artery and popliteal artery. Right SFA and popliteal stent system is patent. Posterior tibial artery with monophasic signal. Pulse volume recording: Segmental PVR of the right lower extremity demonstrates subtle decrease in the quality of the waveform at the high thigh and calf. Augmentation is maintained from high thigh to below knee. Deterioration of the waveform at ankle and digital segment. Left: Resting ankle brachial index: 0.77 Segmental blood pressure: Left brachial pressure measures 137 Doppler: Segmental Doppler of the left lower extremity demonstrates biphasic waveform of the common femoral artery. Monophasic waveform of the popliteal artery, posterior tibial artery, and dorsalis pedis. Pulse volume recording: Segmental PVR of the left lower extremity demonstrates maintained waveform in the high thigh and low thigh with augmentation maintained from high thigh to below knee. Deterioration of the  waveform at the ankle and the left great toe. Additional: IMPRESSION: Right: Resting ankle-brachial index on the right in the normal range, with no evidence of significant arterial occlusive disease. Remainder of the study indicates patent right superficial femoral artery/ popliteal stent system, with persisting atherosclerotic changes and abnormal arterial waveform of the tibial vessels. Left: Resting ankle-brachial index on the left within the mild range of arterial occlusive disease. Remainder of the study demonstrates atherosclerotic changes of the left lower extremity, worst within the tibial vessels. Signed, Dulcy Fanny. Earleen Newport, DO  [facility] Radiology Electronically Signed   By: Corrie Mckusick D.O.   On: 07/08/2015 15:32

## 2015-07-30 ENCOUNTER — Other Ambulatory Visit: Payer: Self-pay | Admitting: Licensed Clinical Social Worker

## 2015-07-30 ENCOUNTER — Encounter: Payer: Self-pay | Admitting: Licensed Clinical Social Worker

## 2015-07-30 NOTE — Patient Outreach (Signed)
[facility]) Care Management  [facility] Social Work  07/30/2015  [name] 1946/03/02 [id number]  Subjective:    Objective:   Current Medications:  Current Outpatient Prescriptions  Medication Sig Dispense Refill  . acetaminophen (TYLENOL) 500 MG tablet Take 1,000 mg by mouth every 6 (six) hours as needed. For pain/headaches     . ALPRAZolam (XANAX) 1 MG tablet Take 1 mg by mouth 4 (four) times daily as needed for anxiety.     Marland Kitchen amiodarone (PACERONE) 200 MG tablet TAKE ONE (1) TABLET EACH DAY 120 tablet 2  . apixaban (ELIQUIS) 5 MG TABS tablet Take 1 tablet (5 mg total) by mouth 2 (two) times daily. 180 tablet 1  . atorvastatin (LIPITOR) 20 MG tablet Take 1 tablet (20 mg total) by mouth daily at 6 PM. 90 tablet 1  . bismuth subsalicylate (PEPTO BISMOL) 262 MG/15ML suspension Take 30 mLs by mouth every 6 (six) hours as needed for indigestion.    . celecoxib (CELEBREX) 200 MG capsule Take 1 capsule by mouth 2 (two) times daily.    . clopidogrel (PLAVIX) 75 MG tablet Take 1 tablet (75 mg total) by mouth daily. 30 tablet 3  . docusate sodium (COLACE) 100 MG capsule Take 100 mg by mouth 2 (two) times daily.      Marland Kitchen doxycycline (VIBRA-TABS) 100 MG tablet Take 100 mg by mouth 2 (two) times daily.    . fenofibrate (TRICOR) 145 MG tablet TAKE ONE (1) TABLET BY MOUTH EVERY DAY 30 tablet 6  . ferrous sulfate 325 (65 FE) MG EC tablet Take 325 mg by mouth every morning.    . gabapentin (NEURONTIN) 300 MG capsule Take 300 mg by mouth 2 (two) times daily.     Marland Kitchen HYDROcodone-acetaminophen (NORCO) 10-325 MG per tablet Take 1 tablet by mouth as needed for severe pain.     . hydrocortisone (PROCTOSOL HC) 2.5 % rectal cream Place 1 application rectally 2 (two) times daily. For 10 days 30 g 0  . insulin glargine (LANTUS) 100 UNIT/ML injection Inject 60-70 Units into the skin 2 (two) times daily. Takes70 units in the morning and 60 units in the evening    . insulin lispro (HUMALOG) 100  UNIT/ML injection Inject 50-60 Units into the skin 3 (three) times daily before meals. 50U BID and 60U QHS    . levothyroxine (SYNTHROID, LEVOTHROID) 25 MCG tablet Take 25 mcg by mouth daily.      . metoprolol succinate (TOPROL-XL) 25 MG 24 hr tablet Take 1 tablet (25 mg total) by mouth daily. 90 tablet 1  . Omega-3 Fatty Acids (FISH OIL PO) Take 1 capsule by mouth 2 (two) times daily.     . pantoprazole (PROTONIX) 40 MG tablet Take 1 tablet (40 mg total) by mouth 2 (two) times daily. 180 tablet 1  . silver sulfADIAZINE (SILVADENE) 1 % cream Apply 1 application topically 4 (four) times daily.    Marland Kitchen torsemide (DEMADEX) 10 MG tablet Take 1 tablet (10 mg total) by mouth daily. 30 tablet 11   No current facility-administered medications for this visit.    Functional Status:  In your present state of health, do you have any difficulty performing the following activities: 06/29/2015 06/04/2015  Hearing? N N  Vision? N -  Difficulty concentrating or making decisions? N Y  Walking or climbing stairs? N N  Dressing or bathing? N N  Doing errands, shopping? N N  Preparing Food and eating ? N N  Using the  Toilet? N N  In the past six months, have you accidently leaked urine? N N  Do you have problems with loss of bowel control? N N  Managing your Medications? N N  Managing your Finances? N N  Housekeeping or managing your Housekeeping? [name]    Fall/Depression Screening:  PHQ 2/9 Scores 07/27/2015 06/29/2015 06/04/2015 04/29/2015 02/18/2015 02/11/2015  PHQ - 2 Score 0 0 '1 1 2 2  ' PHQ- 9 Score - - - - 2 7    Assessment:   CSW met with client at home of client on 07/30/15. Client said he has lived at his residence nearly 4 years.  He said his sister had died in 29.  He said he has one other sister who lives nearby and two sisters who live in [place], [name]. He said he has food needed at present.  He said he has prescribed medications and is taking medications as prescribed.  He said he drives  himself to needed appointments.  He sees Dr[facility]Fusco at [facility].  He said he sees Dr.Fusco every 3 months for medical appointments. He has [employer].  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 He has heating needs at his residence. Furnace at residence of client is not working properly and client is using an Web designer to help him stay warm.  He walks in his home and use a cane occasionally.  He has a walker and wheelchair to use as needed.  He said he has reading challenges.  He said he feels that he will have to pay more for his medications beginning in 2017.  He said he has met his insurance deductible for this year. He said he receives Food Stamps benefit (small monthly benefit). He said he has blood sugar meter and needed diabetic supplies.  He said he feels that he is at risk for falls. He tries to keep home clear of clutter.  He uses glasses to help him see well.  He said he sees cardiologist in [place], [name].  He said he goes to wound specialist in [place].  He has a wound on right foot, great toe.   Wound on right foot great toe is healing per client.  CSW called [employer] in [place], [name] on 07/30/15 and left phone messsage for case worker to call CSW at [phone] and to call client at client home phone number. CSW left case worker the home phone number, name and address of client. CSW asked case worker to call client to discuss heating needs of client at present. Client said he has limited family support.  He said he rents mobile home where he resides.  CSW and client spoke of client care plan. CSW encouraged client to speak with case worker at [employer] (via phone) to discuss heating care needs of client. CSW thanked client for home visit with CSW on 07/30/15.   Plan:  Client to communicate with caseworker at [employer] to learn more about heating resource needs for client CSW to call client in two  weeks to assess client needs.   Norva Riffle.Rahmon Heigl MSW, LCSW Licensed Clinical Social Worker [facility] Care Management [phone]

## 2015-07-31 ENCOUNTER — Other Ambulatory Visit: Payer: Self-pay | Admitting: Licensed Clinical Social Worker

## 2015-07-31 NOTE — Patient Outreach (Signed)
Assessment:  CSW called Office manager in [place], [name] on 07/31/15 and left second phone message for caseworker at that agency regarding current heating needs of client. CSW gave caseworker the phone number of CSW at [facility]. CSW left caseworker the name, address and phone number of client on 07/31/15. CSW, via phone message, informed caseworker that client had heating issues and only has a small heater to heat his mobile home (furnace at mobile home needs repair and client cannot afford to have furnace repaired).  CSW requested that caseworker call client at client home phone number to speak with client about heating needs of client.  CSW called client on 07/31/15 and spoke via phone with client on 07/31/15.  CSW verified identity of client. CSW and Izeyah spoke of client needs.  Client said that [employer] caseworker had called him recently and said that [employer] did not have funds to help with current furnace repairs for client.  Engineer, civil (consulting) had told client that agency did not have funds to help with current area of need of client. CSW and client spoke of client care plan. Client said he may call local Department of Social Services to seek possible heating assistance. CSW encouraged client to call local Department of Social Services and speak with caseworker about possible heating assistance for client. Client said he has a small heater in his living room and a small heater in his bedroom to heat mobile home.  Client is currently receiving telephone support with [employer].  CSW thanked client for phone call with CSW on 07/31/15.   Plan: Client to call local Department of Social Services in next 30 days to speak with caseworker at agency about possible heating assistance for client. CSW to call client in three weeks to assess needs of client.  Norva Riffle.Masiyah Jorstad MSW, LCSW Licensed Clinical Social Worker [facility] Care Management [phone]

## 2015-08-03 ENCOUNTER — Ambulatory Visit (HOSPITAL_COMMUNITY): Payer: Medicare Other | Admitting: Physical Therapy

## 2015-08-03 DIAGNOSIS — S91109D Unspecified open wound of unspecified toe(s) without damage to nail, subsequent encounter: Secondary | ICD-10-CM | POA: Diagnosis not present

## 2015-08-03 DIAGNOSIS — R262 Difficulty in walking, not elsewhere classified: Secondary | ICD-10-CM | POA: Diagnosis not present

## 2015-08-03 DIAGNOSIS — R2681 Unsteadiness on feet: Secondary | ICD-10-CM | POA: Diagnosis not present

## 2015-08-03 NOTE — Therapy (Signed)
[place], [name], [address] Phone: [phone]   Fax:  [phone]  Wound Care Therapy  Patient Details  Name: [name] MRN: [id number] Date of Birth: 04/16/1946 No Data Recorded  Encounter Date: 08/03/2015      PT End of Session - 08/03/15 1200    Visit Number 29   Number of Visits 30   Date for PT Re-Evaluation 08/24/14   Authorization Type [employer] Medicare    Authorization Time Period 04/22/15 to 06/21/05   Authorization - Visit Number 67   Authorization - Number of Visits 30   PT Start Time 0810   PT Stop Time 0840   PT Time Calculation (min) 30 min   Activity Tolerance Patient tolerated treatment well   Behavior During Therapy [facility] for tasks assessed/performed      Past Medical History  Diagnosis Date  . Hypertension   . Diabetes mellitus     type 2   . History of valve replacement 2010    [facility] Ease bioprosthetic 52mm  . Thyroid disease   . Stroke [facility]) 2003    R-sided weakness & upper extremity swelling   . Coronary artery disease   . Dyslipidemia   . At risk for sudden cardiac death 06/24/2013  . PAF (paroxysmal atrial fibrillation) ([place]) 05/2013    converted with amiodarone to SR  . Chronic anticoagulation 05/2013    started  . S/P cardiac catheterization, Rt & Lt heart cath 06/05/2013 2013-06-24  . S/P CABG x 3 2010  . GERD (gastroesophageal reflux disease)   . Cataract   . Chronic kidney disease     Patient reports he is seeing Kidney doctor in September    Past Surgical History  Procedure Laterality Date  . Coronary artery bypass graft  01/26/2009    LIMA to LAD, SVG to circumflex, SVG to PDA  . Aortic valve replacement  01/26/2009    [facility] Ease bioprosthetic 52mm valve  . Transthoracic echocardiogram  09/2011    grade 1 diastolic dysfunction; increasing valve gradient; calcified MV annulus   . Cardiac catheterization  06/05/2013    native 3 vessel  disease; patent LIMA to LAD, SVG to OM and SVG to PDA; Elevated right and left heart filling pressures, with predominantly pulmonary venous hypertension, normal PVR  . Transthoracic echocardiogram  06/03/2013    EF 25-30%, mod conc. hypertrophy, grade 3 diastolic dysfunction; LA mod dilated; calcified MV annulus; transaortic gradients are normal for the bioprosthetic valve; inf vena cava dilated (elevated CVP) - LifeVest  . Left and right heart catheterization with coronary angiogram N/A 06/05/2013    Procedure: LEFT AND RIGHT HEART CATHETERIZATION WITH CORONARY ANGIOGRAM;  Surgeon: Leonie Man, MD;  Location: [facility] CATH LAB;  Service: Cardiovascular;  Laterality: N/A;  . Graft(s) angiogram  06/05/2013    Procedure: GRAFT(S) Cyril Loosen;  Surgeon: Leonie Man, MD;  Location: [facility] CATH LAB;  Service: Cardiovascular;;    There were no vitals filed for this visit.  Visit Diagnosis:  Open toe wound, subsequent encounter  Difficulty walking  Unsteadiness                 Wound Therapy - 08/03/15 1134    Subjective Pt states his lateral toe wound continues to bleed some but is not hurting.    Patient and Family Stated Goals wound to heal    Pain Assessment No/denies pain   Wound Properties Date First Assessed: 06/23/15 Time  First Assessed: 0900 Wound Type: Other (Comment) Location: Toe (Comment  which one) Location Orientation: Right Wound Description (Comments): Lateral aspect of Rt great toe Present on Admission: No   Dressing Type Impregnated gauze (petrolatum);Gauze (Comment)   Dressing Changed Changed   Dressing Status Intact   Dressing Change Frequency Other (Comment)   Site / Wound Assessment Red;Yellow   % Wound base Red or Granulating 80%   % Wound base Yellow 20%   Peri-wound Assessment Intact   Margins Attached edges (approximated)   Drainage Amount Minimal   Drainage Description Serosanguineous   Treatment Cleansed;Debridement (Selective)   Wound Properties Date  First Assessed: 04/22/15 Time First Assessed: 1020 Wound Type: Other (Comment) , open wound   Location: Toe (Comment  which one) , R great toe   Location Orientation: Right Present on Admission: Yes   Dressing Type Impregnated gauze (bismuth)   Dressing Status Old drainage   Site / Wound Assessment Granulation tissue;Yellow   % Wound base Red or Granulating 100%   Peri-wound Assessment Intact   Margins Attached edges (approximated)   Closure Approximated   Drainage Amount None   Treatment Cleansed;Debridement (Selective)   Incision Properties Date First Assessed: 04/23/15 Time First Assessed: 1738 Location: Groin Location Orientation: Left Present on Admission: No   Selective Debridement - Location R great toe    Selective Debridement - Tools Used Forceps;Scalpel   Selective Debridement - Tissue Removed Slough and dry skin, callous edges   Wound Therapy - Clinical Statement Removed most remaining dry skin from tip of Rt great toe.  Some noted serous drainage from one specific area, however no signs/symptoms of infection or re-opening of wound.  Removed borders of lateral wound due to epibole.     Wound Therapy - Functional Problem List non-healing wound complicated with DM and vascular compromise.    Factors Delaying/Impairing Wound Healing Altered sensation;Diabetes Mellitus;Infection - systemic/local;Multiple medical problems;Vascular compromise   Wound Therapy - Frequency Other (comment)   Wound Therapy - Current Recommendations Diabetic teaching;PT   Wound Plan Pt may benefit from continued skilled wound care until fully healed or able to discharge for self care when appropriate    Dressing  xeroform,  vaseline to perimeter, and gauze   Dressing xeroform on both areas now, 2x2, gauze                              Problem List Patient Active Problem List   Diagnosis Date Noted  . Rectal bleeding 07/28/2015  . Chronic anticoagulation 07/28/2015  . PVD  (peripheral vascular disease) ([place])   . S/P angioplasty 04/23/2015  . Peripheral arterial occlusive disease ([place]) 04/23/2015  . PAD (peripheral artery disease) ([place])   . Critical lower limb ischemia   . Toe ulcer, right ([place])   . Type II or unspecified type diabetes mellitus without mention of complication, uncontrolled 11/27/2013  . Acute respiratory failure with hypoxia ([place]) 11/26/2013  . CAP (community acquired pneumonia) 11/26/2013  . CHF exacerbation ([place]) 11/25/2013  . Cardiomyopathy, ischemic 06/13/2013  . At risk for sudden cardiac death 06/14/13  . S/P cardiac catheterization, Rt & Lt heart cath 06/05/2013 06-14-13  . Acute on chronic combined systolic and diastolic HF (heart failure), NYHA class 3 ([place]) 06/04/2013  . Atrial flutter with rapid ventricular response ([place]) 06/02/2013  . Acute renal failure: Cr up to 1.8 06/02/2013  . Hyperkalemia 06/02/2013    Class: Acute  . LBBB (left bundle  branch block) 06/01/2013  . Hypotension- secondary to AF and Diltiazem Rx 06/01/2013  . Obesity (BMI 30-39.9) 06/01/2013  . Acute on chronic diastolic heart failure - due to Afib AVR 06/01/2013  . Atrial fibrillation with RVR- converted with Amiodarone 05/31/2013  . Stroke- Lt brain 2003 04/08/2013  . Hemiplegia affecting right dominant side ([place]) 04/08/2013  . Cataract 04/08/2013  . GERD (gastroesophageal reflux disease) 04/08/2013  . S/P CABG x 3 - 2010 04/08/2013  . S/P AVR-tissue, 2010 04/08/2013  . DM2 (diabetes mellitus, type 2) ([place]) 04/08/2013  . Dyslipidemia 04/08/2013  . SUBACROMIAL BURSITIS, LEFT 11/05/2009    Teena Irani, PTA/CLT [phone]  08/03/2015, 12:02 PM  [facility] [address] [place], [name], [address] Phone: [phone]   Fax:  [phone]  Name: [name] MRN: [id number] Date of Birth: 1946-01-21

## 2015-08-04 ENCOUNTER — Ambulatory Visit (HOSPITAL_COMMUNITY): Payer: Medicare Other | Admitting: Physical Therapy

## 2015-08-04 ENCOUNTER — Telehealth: Payer: Self-pay | Admitting: Gastroenterology

## 2015-08-04 ENCOUNTER — Encounter: Payer: Self-pay | Admitting: Gastroenterology

## 2015-08-04 NOTE — Telephone Encounter (Signed)
Dr. Debara Pickett, I saw this mutual patient in consultation for rectal bleeding. He is on Plavix given stenting of popliteal and superficial femoral arteries 04/2015. Eliquis as well for Afib. We are offering him a colonoscopy but would like to hold Eliquis if possible.

## 2015-08-04 NOTE — Assessment & Plan Note (Signed)
Improved on PPI BID. Continue current regimen. Discussed antireflux measures.

## 2015-08-04 NOTE — Telephone Encounter (Signed)
Yes .. Ok to hold Eliquis for 2 days prior to the procedure. He needs to stay on the plavix if at all possible. Thanks for seeing him regarding the bleeding.  Dr. Lemmie Evens

## 2015-08-04 NOTE — Assessment & Plan Note (Signed)
Possible benign anorectal bleeding in setting of Eliquis and Plavix. Stent of popliteal and superficial femoral arteries done 04/2015. 3-6 months of plavix planned. Also on Eliquis for Afib. Will touch base with Dr. Debara [name]. We can performed colonoscopy while on Plavix but would like to be able to hold Eliquis in this setting. If Dr. Debara [name] is in agreement, we can pursue colonoscopy at this time. Further recommendations to follow.

## 2015-08-04 NOTE — Progress Notes (Signed)
CC'ED TO PCP 

## 2015-08-05 ENCOUNTER — Other Ambulatory Visit: Payer: Self-pay

## 2015-08-05 DIAGNOSIS — K219 Gastro-esophageal reflux disease without esophagitis: Secondary | ICD-10-CM

## 2015-08-05 DIAGNOSIS — K625 Hemorrhage of anus and rectum: Secondary | ICD-10-CM

## 2015-08-05 DIAGNOSIS — K2270 Barrett's esophagus without dysplasia: Secondary | ICD-10-CM

## 2015-08-05 MED ORDER — PEG 3350-KCL-NA BICARB-NACL 420 G PO SOLR: 4000.0000 mL | Freq: Once | ORAL | Status: DC

## 2015-08-05 NOTE — Telephone Encounter (Signed)
Please offer patient colonoscopy to evaluate his rectal bleeding. If he has had GERD for more than 5 years, than offer EGD (for screening for barrett's, chronic gerd) as well.  Per Dr. Debara Pickett, [place] to hold Eliquis 2 days before procedure. CONTINUE PLAVIX. Hold pepto-bismol, iron 7 days before procedure.  Day of prep: Lantus 35units in AM and 30 units in PM. Humalog 1/2 regular dose,

## 2015-08-05 NOTE — Telephone Encounter (Signed)
Mailed instructions to pt 

## 2015-08-05 NOTE — Telephone Encounter (Signed)
Pt agrees to have both EGD/TCS

## 2015-08-06 ENCOUNTER — Ambulatory Visit (HOSPITAL_COMMUNITY): Payer: Medicare Other | Admitting: Physical Therapy

## 2015-08-06 DIAGNOSIS — R2681 Unsteadiness on feet: Secondary | ICD-10-CM | POA: Diagnosis not present

## 2015-08-06 DIAGNOSIS — S91109D Unspecified open wound of unspecified toe(s) without damage to nail, subsequent encounter: Secondary | ICD-10-CM | POA: Diagnosis not present

## 2015-08-06 DIAGNOSIS — R262 Difficulty in walking, not elsewhere classified: Secondary | ICD-10-CM

## 2015-08-06 NOTE — Therapy (Signed)
[place], [name], [address] Phone: [phone]   Fax:  [phone]  Wound Care Therapy (Re-Assess)  Patient Details  Name: [name] MRN: [id number] Date of Birth: 1946-01-01 No Data Recorded  Encounter Date: 08/06/2015      PT End of Session - 08/06/15 1012    Visit Number 30   Number of Visits 40   Date for PT Re-Evaluation 08/24/14   Authorization Type UHC Medicare    Authorization Time Period 04/22/15 to 06/21/05   Authorization - Visit Number 60   Authorization - Number of Visits 41   PT Start Time 0815  pt was late for appointment   PT Stop Time 0842   PT Time Calculation (min) 27 min   Activity Tolerance Patient tolerated treatment well   Behavior During Therapy [facility] for tasks assessed/performed      Past Medical History  Diagnosis Date  . Hypertension   . Diabetes mellitus     type 2   . History of valve replacement 2010    [facility] Ease bioprosthetic 86m  . Thyroid disease   . Stroke ([facility] 2003    R-sided weakness & upper extremity swelling   . Coronary artery disease   . Dyslipidemia   . At risk for sudden cardiac death 1November 15, 2014 . [place]    converted with amiodarone to SR  . Chronic anticoagulation 05/2013    started  . S/P cardiac catheterization, Rt & Lt heart cath 06/05/2013 12014-11-15 . S/P CABG x 3 2010  . GERD (gastroesophageal reflux disease)   . Cataract   . Chronic kidney disease     Patient reports he is seeing Kidney doctor in September    Past Surgical History  Procedure Laterality Date  . Coronary artery bypass graft  01/26/2009    LIMA to LAD, SVG to circumflex, SVG to PDA  . [facility]  [facility]  . Transthoracic echocardiogram  09/2011    grade 1 diastolic dysfunction; increasing valve gradient; calcified MV annulus   . Cardiac  catheterization  06/05/2013    native 3 vessel disease; patent LIMA to LAD, SVG to OM and SVG to PDA; Elevated right and left heart filling pressures, with predominantly pulmonary venous hypertension, normal PVR  . Transthoracic echocardiogram  06/03/2013    EF 25-30%, mod conc. hypertrophy, grade 3 diastolic dysfunction; LA mod dilated; calcified MV annulus; transaortic gradients are normal for the bioprosthetic valve; inf vena cava dilated (elevated CVP) - LifeVest  . Left and right heart catheterization with coronary angiogram N/A 06/05/2013    Procedure: LEFT AND RIGHT HEART CATHETERIZATION WITH CORONARY ANGIOGRAM;  Surgeon: DaLeonie ManMD;  [facility];  Service: Cardiovascular;  Laterality: N/A;  . Graft(s) angiogram  06/05/2013    Procedure: GRAFT(S) ANCyril Loosen Surgeon: DaLeonie ManMD;  [facility];  Service: Cardiovascular;;  . Colonoscopy  2007    Dr. MaAviva Signsinternal hemorrhoids    There were no vitals filed for this visit.  Visit Diagnosis:  Open toe wound, subsequent encounter  Difficulty walking  Unsteadiness                 Wound Therapy - 08/06/15 0941    Subjective PT states his toe is feeling good without complaints   Patient and Family Stated Goals wound to heal  Pain Assessment No/denies pain   Wound Properties Date First Assessed: 06/23/15 Time First Assessed: 0900 Wound Type: Other (Comment) Location: Toe (Comment  which one) Location Orientation: Right Wound Description (Comments): Lateral aspect of Rt great toe Present on Admission: No   Dressing Type Impregnated gauze (petrolatum);Gauze (Comment)   Dressing Changed Changed   Dressing Status Intact   Dressing Change Frequency Other (Comment)   Site / Wound Assessment Red;Yellow   % Wound base Red or Granulating 80%   % Wound base Yellow 20%   Peri-wound Assessment Intact   Wound Length (cm) 1 cm  was 1.3 cm   Wound Width (cm) 0.5 cm  was 0.7   Wound Depth  (cm) 0 cm   Margins Attached edges (approximated)   Drainage Amount Minimal   Drainage Description Serosanguineous   Treatment Cleansed;Debridement (Selective)   Wound Properties Date First Assessed: 04/22/15 Time First Assessed: 1020 Wound Type: Other (Comment) , open wound   Location: Toe (Comment  which one) , R great toe   Location Orientation: Right Present on Admission: Yes   Dressing Type Impregnated gauze (bismuth)   Dressing Changed Changed   Dressing Status Old drainage   Site / Wound Assessment Granulation tissue;Yellow   % Wound base Red or Granulating 100%   Peri-wound Assessment Intact   Wound Width (cm) --  just dry skin, no opening or wound remaining   Margins Attached edges (approximated)   Closure Approximated   Drainage Amount None   Treatment Cleansed;Debridement (Selective)   Incision Properties Date First Assessed: 04/23/15 Time First Assessed: 1738 Location: Groin Location Orientation: Left Present on Admission: No   Selective Debridement - Location R great toe    Selective Debridement - Tools Used Forceps;Scalpel   Selective Debridement - Tissue Removed Slough and dry skin, callous edges   Wound Therapy - Clinical Statement Re-assessed and measured today. Pt with overall improvement and healing of wound on tip of toe.  continued to remove dry skin from tip of toe and epibole tissue aound lateral wound.  slough continues to to decrease with increasing granulation tissue.     Wound Therapy - Functional Problem List non-healing wound complicated with DM and vascular compromise.    Factors Delaying/Impairing Wound Healing Altered sensation;Diabetes Mellitus;Infection - systemic/local;Multiple medical problems;Vascular compromise   Hydrotherapy Plan Debridement;Dressing change   Wound Therapy - Frequency Other (comment)   Wound Therapy - Current Recommendations Diabetic teaching;PT   Wound Plan Pt may benefit from continued skilled wound care 2X week until fully healed  or able to discharge for self care when appropriate    Dressing  xeroform,  vaseline to perimeter, and gauze   Dressing xeroform on both areas now, 2x2, gauze   Decrease Necrotic Tissue to 20%   Decrease Necrotic Tissue - Progress Partly met   Increase Granulation Tissue to 80%   Increase Granulation Tissue - Progress Partly met   Decrease Length/Width/Depth by (cm) 1.0   Decrease Length/Width/Depth - Progress Partly met   Improve Drainage Characteristics Min   Improve Drainage Characteristics - Progress Met   Patient/Family will be able to  independently dress and inspect wound for signs of infection   Patient/Family Instruction Goal - Progress Progressing toward goal   Additional Wound Therapy Goal Full wound healing    Additional Wound Therapy Goal - Progress Progressing toward goal   Goals/treatment plan/discharge plan were made with and agreed upon by patient/family Yes  Problem List Patient Active Problem List   Diagnosis Date Noted  . Rectal bleeding 07/28/2015  . Chronic anticoagulation 07/28/2015  . PVD (peripheral vascular disease) ([place])   . S/P angioplasty 04/23/2015  . Peripheral arterial occlusive disease ([place]) 04/23/2015  . PAD (peripheral artery disease) ([place])   . Critical lower limb ischemia   . Toe ulcer, right ([place])   . Type II or unspecified type diabetes mellitus without mention of complication, uncontrolled 11/27/2013  . Acute respiratory failure with hypoxia ([place]) 11/26/2013  . CAP (community acquired pneumonia) 11/26/2013  . CHF exacerbation ([place]) 11/25/2013  . Cardiomyopathy, ischemic 06/13/2013  . At risk for sudden cardiac death 06/19/13  . S/P cardiac catheterization, Rt & Lt heart cath 06/05/2013 2013-06-19  . Acute on chronic combined systolic and diastolic HF (heart failure), NYHA class 3 ([place]) 06/04/2013  . Atrial flutter with rapid ventricular response ([place]) 06/02/2013  . Acute renal failure: Cr  up to 1.8 06/02/2013  . Hyperkalemia 06/02/2013    Class: Acute  . LBBB (left bundle branch block) 06/01/2013  . Hypotension- secondary to AF and Diltiazem Rx 06/01/2013  . Obesity (BMI 30-39.9) 06/01/2013  . Acute on chronic diastolic heart failure - due to Afib AVR 06/01/2013  . Atrial fibrillation with RVR- converted with Amiodarone 05/31/2013  . Stroke- Lt brain 2003 04/08/2013  . Hemiplegia affecting right dominant side ([place]) 04/08/2013  . Cataract 04/08/2013  . GERD (gastroesophageal reflux disease) 04/08/2013  . S/P CABG x 3 - 2010 04/08/2013  . S/P AVR-tissue, 2010 04/08/2013  . DM2 (diabetes mellitus, type 2) ([place]) 04/08/2013  . Dyslipidemia 04/08/2013  . SUBACROMIAL BURSITIS, LEFT 11/05/2009    Teena Irani, PTA/CLT [phone]  2015-08-18, 12:07 PM       G-Codes - 2015/08/18 Oct 24, 1205    Functional Assessment Tool Used Based on skilled clinical assessment of wound healing status, gait/mobility    Functional Limitation Other PT primary     Other PT Primary Current Status [phone]) At least 40 percent but less than 60 percent impaired, limited or restricted   Other PT Primary Goal Status ([id number]) At least 20 percent but less than 40 percent impaired, limited or restricted      Physical Therapy Progress Note  Dates of Reporting Period: 07/28/15 to 08-18-2015  Objective Reports of Subjective Statement: see above   Objective Measurements: see above   Goal Update: see above   Plan: see above   Reason Skilled Services are Required: facilitate full wound healing, prevent infection   Deniece Ree PT, DPT [place], [name], [address] Phone: [phone]   Fax:  [phone]  Name: [name] MRN: [id number] Date of Birth: 1946/04/02

## 2015-08-12 ENCOUNTER — Ambulatory Visit (HOSPITAL_COMMUNITY): Payer: Medicare Other

## 2015-08-12 DIAGNOSIS — R2681 Unsteadiness on feet: Secondary | ICD-10-CM | POA: Diagnosis not present

## 2015-08-12 DIAGNOSIS — R262 Difficulty in walking, not elsewhere classified: Secondary | ICD-10-CM

## 2015-08-12 DIAGNOSIS — S91109D Unspecified open wound of unspecified toe(s) without damage to nail, subsequent encounter: Secondary | ICD-10-CM

## 2015-08-12 NOTE — Therapy (Signed)
[place], [name], [address] Phone: [phone]   Fax:  [phone]  Physical Therapy Treatment  Patient Details  Name: [name] MRN: [id number] Date of Birth: 11/13/1945 No Data Recorded  Encounter Date: 08/12/2015      PT End of Session - 08/12/15 0844    Visit Number 31   Number of Visits 40   Date for PT Re-Evaluation 08/24/14   Authorization Type UHC Medicare    Authorization Time Period 04/22/15 to 06/21/05   Authorization - Visit Number 31   Authorization - Number of Visits 40   PT Start Time 0804   PT Stop Time 0830   PT Time Calculation (min) 26 min   Activity Tolerance Patient tolerated treatment well   Behavior During Therapy [facility] for tasks assessed/performed      Past Medical History  Diagnosis Date  . Hypertension   . Diabetes mellitus     type 2   . History of valve replacement 2010    [facility] Ease bioprosthetic 58mm  . Thyroid disease   . Stroke [facility]) 2003    R-sided weakness & upper extremity swelling   . Coronary artery disease   . Dyslipidemia   . At risk for sudden cardiac death 06/30/13  . PAF (paroxysmal atrial fibrillation) ([place]) 05/2013    converted with amiodarone to SR  . Chronic anticoagulation 05/2013    started  . S/P cardiac catheterization, Rt & Lt heart cath 06/05/2013 06/30/13  . S/P CABG x 3 2010  . GERD (gastroesophageal reflux disease)   . Cataract   . Chronic kidney disease     Patient reports he is seeing Kidney doctor in September    Past Surgical History  Procedure Laterality Date  . Coronary artery bypass graft  01/26/2009    LIMA to LAD, SVG to circumflex, SVG to PDA  . Aortic valve replacement  01/26/2009    [facility] Ease bioprosthetic 33mm valve  . Transthoracic echocardiogram  09/2011    grade 1 diastolic dysfunction; increasing valve gradient; calcified MV annulus   . Cardiac catheterization  06/05/2013    native 3 vessel  disease; patent LIMA to LAD, SVG to OM and SVG to PDA; Elevated right and left heart filling pressures, with predominantly pulmonary venous hypertension, normal PVR  . Transthoracic echocardiogram  06/03/2013    EF 25-30%, mod conc. hypertrophy, grade 3 diastolic dysfunction; LA mod dilated; calcified MV annulus; transaortic gradients are normal for the bioprosthetic valve; inf vena cava dilated (elevated CVP) - LifeVest  . Left and right heart catheterization with coronary angiogram N/A 06/05/2013    Procedure: LEFT AND RIGHT HEART CATHETERIZATION WITH CORONARY ANGIOGRAM;  Surgeon: Leonie Man, MD;  [facility];  Service: Cardiovascular;  Laterality: N/A;  . Graft(s) angiogram  06/05/2013    Procedure: GRAFT(S) Cyril Loosen;  Surgeon: Leonie Man, MD;  Location: [facility] CATH LAB;  Service: Cardiovascular;;  . Colonoscopy  2007    Dr. Aviva Signs: internal hemorrhoids    There were no vitals filed for this visit.  Visit Diagnosis:  Open toe wound, subsequent encounter  Difficulty walking  Unsteadiness      Subjective Assessment - 08/12/15 0839    Subjective Pain free today, pt reports blood drainage through dressings   Currently in Pain? No/denies                       Wound Therapy -  08/12/15 0840    Subjective Pain free today, pt reports blood drainage through dressings   Patient and Family Stated Goals wound to heal    Pain Assessment No/denies pain   Wound Properties Date First Assessed: 06/23/15 Time First Assessed: 0900 Wound Type: Other (Comment) Location: Toe (Comment  which one) Location Orientation: Right Wound Description (Comments): Lateral aspect of Rt great toe Present on Admission: No   Dressing Type Impregnated gauze (petrolatum);Gauze (Comment)   Dressing Changed Changed   Dressing Status Intact   Dressing Change Frequency Other (Comment)   Site / Wound Assessment Red;Yellow   % Wound base Red or Granulating 75%   % Wound base Yellow  25%   Peri-wound Assessment Intact   Margins Attached edges (approximated)   Drainage Amount Minimal   Drainage Description Serosanguineous   Treatment Cleansed;Debridement (Selective)   Wound Properties Date First Assessed: 04/22/15 Time First Assessed: 1020 Wound Type: Other (Comment) , open wound   Location: Toe (Comment  which one) , R great toe   Location Orientation: Right Present on Admission: Yes   Dressing Type Impregnated gauze (bismuth)   Dressing Changed Changed   Dressing Status Old drainage   Site / Wound Assessment Granulation tissue;Yellow   % Wound base Red or Granulating 100%   Peri-wound Assessment Intact   Margins Attached edges (approximated)   Closure Approximated   Drainage Amount None   Treatment Cleansed   Incision Properties Date First Assessed: 04/23/15 Time First Assessed: 1738 Location: Groin Location Orientation: Left Present on Admission: No   Selective Debridement - Location R great toe    Selective Debridement - Tools Used Forceps;Scalpel   Selective Debridement - Tissue Removed Slough and dry skin, callous edges   Wound Therapy - Clinical Statement Selective debridement complete to lateral aspect of great toe for removal of slough, dry skin and callous perimeter of wound to promote healing.  Added gauze for blood drainage.  No reports of increased pain through session.   Wound Therapy - Functional Problem List non-healing wound complicated with DM and vascular compromise.    Factors Delaying/Impairing Wound Healing Altered sensation;Diabetes Mellitus;Infection - systemic/local;Multiple medical problems;Vascular compromise   Hydrotherapy Plan Debridement;Dressing change   Wound Therapy - Frequency Other (comment)  2x/ week   Wound Therapy - Current Recommendations Diabetic teaching;PT   Wound Plan Continue with appropriate dressings to wound to promote healing.   Dressing  xeroform,  vaseline to perimeter, and gauze   Dressing xeroform on both areas now,  2x2, gauze          Problem List Patient Active Problem List   Diagnosis Date Noted  . Rectal bleeding 07/28/2015  . Chronic anticoagulation 07/28/2015  . PVD (peripheral vascular disease) ([place])   . S/P angioplasty 04/23/2015  . Peripheral arterial occlusive disease ([place]) 04/23/2015  . PAD (peripheral artery disease) ([place])   . Critical lower limb ischemia   . Toe ulcer, right ([place])   . Type II or unspecified type diabetes mellitus without mention of complication, uncontrolled 11/27/2013  . Acute respiratory failure with hypoxia ([place]) 11/26/2013  . CAP (community acquired pneumonia) 11/26/2013  . CHF exacerbation ([place]) 11/25/2013  . Cardiomyopathy, ischemic 06/13/2013  . At risk for sudden cardiac death Jun 15, 2013  . S/P cardiac catheterization, Rt & Lt heart cath 06/05/2013 06/15/13  . Acute on chronic combined systolic and diastolic HF (heart failure), NYHA class 3 ([place]) 06/04/2013  . Atrial flutter with rapid ventricular response ([place]) 06/02/2013  . Acute renal failure:  Cr up to 1.8 06/02/2013  . Hyperkalemia 06/02/2013    Class: Acute  . LBBB (left bundle branch block) 06/01/2013  . Hypotension- secondary to AF and Diltiazem Rx 06/01/2013  . Obesity (BMI 30-39.9) 06/01/2013  . Acute on chronic diastolic heart failure - due to Afib AVR 06/01/2013  . Atrial fibrillation with RVR- converted with Amiodarone 05/31/2013  . Stroke- Lt brain 2003 04/08/2013  . Hemiplegia affecting right dominant side ([place]) 04/08/2013  . Cataract 04/08/2013  . GERD (gastroesophageal reflux disease) 04/08/2013  . S/P CABG x 3 - 2010 04/08/2013  . S/P AVR-tissue, 2010 04/08/2013  . DM2 (diabetes mellitus, type 2) ([name]) 04/08/2013  . Dyslipidemia 04/08/2013  . SUBACROMIAL BURSITIS, LEFT 11/05/2009   Ihor Austin, LPTA; CBIS [phone]  Aldona Lento 08/12/2015, 8:48 AM  [place], [name], [address] Phone:  [phone]   Fax:  [phone]  Name: TARRENCE ROMICK MRN: [id number] Date of Birth: [birth year]

## 2015-08-13 ENCOUNTER — Ambulatory Visit: Payer: Medicare Other | Admitting: Licensed Clinical Social Worker

## 2015-08-18 ENCOUNTER — Telehealth: Payer: Self-pay

## 2015-08-18 ENCOUNTER — Other Ambulatory Visit: Payer: Self-pay

## 2015-08-18 ENCOUNTER — Ambulatory Visit (HOSPITAL_COMMUNITY): Payer: Medicare Other | Attending: Orthopaedic Surgery | Admitting: Physical Therapy

## 2015-08-18 DIAGNOSIS — S91109D Unspecified open wound of unspecified toe(s) without damage to nail, subsequent encounter: Secondary | ICD-10-CM

## 2015-08-18 DIAGNOSIS — R2681 Unsteadiness on feet: Secondary | ICD-10-CM | POA: Diagnosis not present

## 2015-08-18 DIAGNOSIS — R262 Difficulty in walking, not elsewhere classified: Secondary | ICD-10-CM | POA: Insufficient documentation

## 2015-08-18 NOTE — Telephone Encounter (Signed)
Pt called and states that he needs to cancel his procedure for 08/20/2015.  States he has a lot of family things going on right now and needs to wait.

## 2015-08-18 NOTE — Therapy (Signed)
[facility] [place], [name], [address] Phone: [phone]   Fax:  [phone]  Wound Care Therapy  Patient Details  Name: [name] MRN: [id number] Date of Birth: [birth year] No Data Recorded  Encounter Date: 08/18/2015      PT End of Session - 08/18/15 1424    Visit Number 32   Number of Visits 40   Date for PT Re-Evaluation 08/24/14   Authorization Type UHC Medicare    Authorization Time Period 04/22/15 to 06/21/05   Authorization - Visit Number 71   Authorization - Number of Visits 40   PT Start Time 0756   PT Stop Time 0825   PT Time Calculation (min) 29 min   Activity Tolerance Patient tolerated treatment well   Behavior During Therapy [facility] for tasks assessed/performed      Past Medical History  Diagnosis Date  . Hypertension   . Diabetes mellitus     type 2   . History of valve replacement 2010    [facility] Ease bioprosthetic 34mm  . Thyroid disease   . Stroke [facility]) 2003    R-sided weakness & upper extremity swelling   . Coronary artery disease   . Dyslipidemia   . At risk for sudden cardiac death 06-15-13  . PAF (paroxysmal atrial fibrillation) ([place]) 05/2013    converted with amiodarone to SR  . Chronic anticoagulation 05/2013    started  . S/P cardiac catheterization, Rt & Lt heart cath 06/05/2013 Jun 15, 2013  . S/P CABG x 3 2010  . GERD (gastroesophageal reflux disease)   . Cataract   . Chronic kidney disease     Patient reports he is seeing Kidney doctor in September    Past Surgical History  Procedure Laterality Date  . Coronary artery bypass graft  01/26/2009    LIMA to LAD, SVG to circumflex, SVG to PDA  . Aortic valve replacement  01/26/2009    [facility] Ease bioprosthetic 9mm valve  . Transthoracic echocardiogram  09/2011    grade 1 diastolic dysfunction; increasing valve gradient; calcified MV annulus   . Cardiac catheterization  06/05/2013    native 3 vessel disease;  patent LIMA to LAD, SVG to OM and SVG to PDA; Elevated right and left heart filling pressures, with predominantly pulmonary venous hypertension, normal PVR  . Transthoracic echocardiogram  06/03/2013    EF 25-30%, mod conc. hypertrophy, grade 3 diastolic dysfunction; LA mod dilated; calcified MV annulus; transaortic gradients are normal for the bioprosthetic valve; inf vena cava dilated (elevated CVP) - LifeVest  . Left and right heart catheterization with coronary angiogram N/A 06/05/2013    Procedure: LEFT AND RIGHT HEART CATHETERIZATION WITH CORONARY ANGIOGRAM;  Surgeon: Leonie Man, MD;  Location: [facility] CATH LAB;  Service: Cardiovascular;  Laterality: N/A;  . Graft(s) angiogram  06/05/2013    Procedure: GRAFT(S) Cyril Loosen;  Surgeon: Leonie Man, MD;  Location: [facility] CATH LAB;  Service: Cardiovascular;;  . Colonoscopy  2007    Dr. Aviva Signs: internal hemorrhoids    There were no vitals filed for this visit.  Visit Diagnosis:  Open toe wound, subsequent encounter  Difficulty walking  Unsteadiness                 Wound Therapy - 08/18/15 1414    Subjective Pt states he has to travel to [place] after session today.  Pt without pain or new complaints.     Patient and Family Stated Goals wound  to heal    Pain Assessment No/denies pain   Wound Properties Date First Assessed: 06/23/15 Time First Assessed: 0900 Wound Type: Other (Comment) Location: Toe (Comment  which one) Location Orientation: Right Wound Description (Comments): Lateral aspect of Rt great toe Present on Admission: No   Dressing Type Impregnated gauze (petrolatum);Gauze (Comment)   Dressing Changed Changed   Dressing Status Intact   Dressing Change Frequency Other (Comment)   Site / Wound Assessment Red;Yellow   % Wound base Red or Granulating 75%   % Wound base Yellow 25%   Peri-wound Assessment Intact   Margins Attached edges (approximated)   Drainage Amount Minimal   Drainage Description  Serosanguineous   Treatment Cleansed;Debridement (Selective)   Wound Properties Date First Assessed: 04/22/15 Time First Assessed: 1020 Wound Type: Other (Comment) , open wound   Location: Toe (Comment  which one) , R great toe   Location Orientation: Right Present on Admission: Yes   Dressing Type Impregnated gauze (bismuth)   Dressing Changed Changed   Dressing Status Old drainage   Site / Wound Assessment Granulation tissue;Yellow   % Wound base Red or Granulating 100%   Peri-wound Assessment Intact   Margins Attached edges (approximated)   Closure Approximated   Drainage Amount None   Treatment Cleansed   Incision Properties Date First Assessed: 04/23/15 Time First Assessed: 1738 Location: Groin Location Orientation: Left Present on Admission: No   Selective Debridement - Location R great toe    Selective Debridement - Tools Used Forceps;Scalpel   Selective Debridement - Tissue Removed Slough and dry skin, callous edges   Wound Therapy - Clinical Statement Continued dryness on superiior tip of Great toe.  Undermining present at plantar location of lateral toe wound, however improving overall.  No signs of infection present.  Continued with xeroform dressing.   Wound Therapy - Functional Problem List non-healing wound complicated with DM and vascular compromise.    Factors Delaying/Impairing Wound Healing Altered sensation;Diabetes Mellitus;Infection - systemic/local;Multiple medical problems;Vascular compromise   Hydrotherapy Plan Debridement;Dressing change   Wound Therapy - Frequency Other (comment)  2x/ week   Wound Therapy - Current Recommendations Diabetic teaching;PT   Wound Plan Continue with appropriate dressings to wound to promote healing.   Dressing  xeroform,  vaseline to perimeter, and gauze   Dressing xeroform on both areas now, 2x2, gauze                              Problem List Patient Active Problem List   Diagnosis Date Noted  . Rectal  bleeding 07/28/2015  . Chronic anticoagulation 07/28/2015  . PVD (peripheral vascular disease) ([place])   . S/P angioplasty 04/23/2015  . Peripheral arterial occlusive disease ([place]) 04/23/2015  . PAD (peripheral artery disease) ([place])   . Critical lower limb ischemia   . Toe ulcer, right ([place])   . Type II or unspecified type diabetes mellitus without mention of complication, uncontrolled 11/27/2013  . Acute respiratory failure with hypoxia ([place]) 11/26/2013  . CAP (community acquired pneumonia) 11/26/2013  . CHF exacerbation ([place]) 11/25/2013  . Cardiomyopathy, ischemic 06/13/2013  . At risk for sudden cardiac death 06-23-13  . S/P cardiac catheterization, Rt & Lt heart cath 06/05/2013 06-23-2013  . Acute on chronic combined systolic and diastolic HF (heart failure), NYHA class 3 ([place]) 06/04/2013  . Atrial flutter with rapid ventricular response ([place]) 06/02/2013  . Acute renal failure: Cr up to 1.8 06/02/2013  . Hyperkalemia  06/02/2013    Class: Acute  . LBBB (left bundle branch block) 06/01/2013  . Hypotension- secondary to AF and Diltiazem Rx 06/01/2013  . Obesity (BMI 30-39.9) 06/01/2013  . Acute on chronic diastolic heart failure - due to Afib AVR 06/01/2013  . Atrial fibrillation with RVR- converted with Amiodarone 05/31/2013  . Stroke- Lt brain 2003 04/08/2013  . Hemiplegia affecting right dominant side ([place]) 04/08/2013  . Cataract 04/08/2013  . GERD (gastroesophageal reflux disease) 04/08/2013  . S/P CABG x 3 - 2010 04/08/2013  . S/P AVR-tissue, 2010 04/08/2013  . DM2 (diabetes mellitus, type 2) ([place]) 04/08/2013  . Dyslipidemia 04/08/2013  . SUBACROMIAL BURSITIS, LEFT 11/05/2009    Teena Irani, PTA/CLT [phone]  08/18/2015, 2:25 PM  [facility] [address] [place], [name], [address] Phone: [phone]   Fax:  [phone]  Name: WM RIVIELLO MRN: [id number] Date of Birth: August 13, 1946

## 2015-08-20 ENCOUNTER — Ambulatory Visit (HOSPITAL_COMMUNITY): Payer: Medicare Other | Admitting: Physical Therapy

## 2015-08-20 ENCOUNTER — Encounter (HOSPITAL_COMMUNITY): Admission: RE | Payer: Self-pay | Source: Ambulatory Visit

## 2015-08-20 ENCOUNTER — Ambulatory Visit (HOSPITAL_COMMUNITY): Admission: RE | Admit: 2015-08-20 | Payer: Medicare Other | Source: Ambulatory Visit | Admitting: Internal Medicine

## 2015-08-20 DIAGNOSIS — S91109D Unspecified open wound of unspecified toe(s) without damage to nail, subsequent encounter: Secondary | ICD-10-CM

## 2015-08-20 DIAGNOSIS — R262 Difficulty in walking, not elsewhere classified: Secondary | ICD-10-CM | POA: Diagnosis not present

## 2015-08-20 DIAGNOSIS — R2681 Unsteadiness on feet: Secondary | ICD-10-CM | POA: Diagnosis not present

## 2015-08-20 SURGERY — COLONOSCOPY
Anesthesia: Moderate Sedation

## 2015-08-20 NOTE — Therapy (Signed)
[facility] [place], [name], [address] Phone: [phone]   Fax:  [phone]  Wound Care Therapy  Patient Details  Name: [name] MRN: [id number] Date of Birth: 1946/08/12 No Data Recorded  Encounter Date: 08/20/2015      PT End of Session - 08/20/15 0848    Visit Number 33   Number of Visits 40   Authorization Type UHC Medicare    Authorization Time Period 04/22/15 to 06/21/05   Authorization - Visit Number 91   Authorization - Number of Visits 40   PT Start Time 0800   PT Stop Time 0835   PT Time Calculation (min) 35 min   Activity Tolerance Patient tolerated treatment well      Past Medical History  Diagnosis Date  . Hypertension   . Diabetes mellitus     type 2   . History of valve replacement 2010    [facility] Ease bioprosthetic 26m  . Thyroid disease   . Stroke ([facility] 2003    R-sided weakness & upper extremity swelling   . Coronary artery disease   . Dyslipidemia   . At risk for sudden cardiac death 107-Nov-2014 . PAF (paroxysmal atrial fibrillation) ([place] 05/2013    converted with amiodarone to SR  . Chronic anticoagulation 05/2013    started  . S/P cardiac catheterization, Rt & Lt heart cath 06/05/2013 12014/11/07 . S/P CABG x 3 2010  . GERD (gastroesophageal reflux disease)   . Cataract   . Chronic kidney disease     Patient reports he is seeing Kidney doctor in September    Past Surgical History  Procedure Laterality Date  . Coronary artery bypass graft  01/26/2009    LIMA to LAD, SVG to circumflex, SVG to PDA  . [facility]  [facility]  . Transthoracic echocardiogram  09/2011    grade 1 diastolic dysfunction; increasing valve gradient; calcified MV annulus   . Cardiac catheterization  06/05/2013    native 3 vessel disease; patent LIMA to LAD, SVG to OM and SVG to PDA; Elevated right and left heart filling pressures,  with predominantly pulmonary venous hypertension, normal PVR  . Transthoracic echocardiogram  06/03/2013    EF 25-30%, mod conc. hypertrophy, grade 3 diastolic dysfunction; LA mod dilated; calcified MV annulus; transaortic gradients are normal for the bioprosthetic valve; inf vena cava dilated (elevated CVP) - LifeVest  . Left and right heart catheterization with coronary angiogram N/A 06/05/2013    Procedure: LEFT AND RIGHT HEART CATHETERIZATION WITH CORONARY ANGIOGRAM;  Surgeon: DaLeonie ManMD;  [facility];  Service: Cardiovascular;  Laterality: N/A;  . Graft(s) angiogram  06/05/2013    Procedure: GRAFT(S) ANCyril Loosen Surgeon: DaLeonie ManMD;  [facility];  Service: Cardiovascular;;  . Colonoscopy  2007    Dr. MaAviva Signsinternal hemorrhoids    There were no vitals filed for this visit.  Visit Diagnosis:  Open toe wound, subsequent encounter  Difficulty walking  Unsteadiness                 Wound Therapy - 08/20/15 0838    Subjective Pt states that his toe was wrapped too tightly last treatment   Patient and Family Stated Goals wound to heal    Pain Assessment No/denies pain   Wound Properties Date First Assessed: 06/23/15 Time First Assessed: 0900 Wound Type: Other (Comment)  Location: Toe (Comment  which one) Location Orientation: Right Wound Description (Comments): Lateral aspect of Rt great toe Present on Admission: No   Dressing Type Impregnated gauze (bismuth)   Dressing Changed Changed   Dressing Status Intact;Old drainage   Dressing Change Frequency PRN   Site / Wound Assessment Red;Yellow   % Wound base Red or Granulating 75%   % Wound base Yellow 25%   Peri-wound Assessment Edema;Erythema (blanchable)   Wound Length (cm) 1.7 cm   Wound Width (cm) 0.6 cm   Wound Depth (cm) 0.1 cm   Margins Epibole (rolled edges)   Drainage Amount Minimal   Drainage Description Serosanguineous;Odor   Treatment Cleansed;Debridement  (Selective)   Wound Properties Date First Assessed: 04/22/15 Time First Assessed: 1020 Wound Type: Other (Comment) , open wound   Location: Toe (Comment  which one) , R great toe   Location Orientation: Right Present on Admission: Yes Final Assessment Date: 08/20/15 Final Assessment Time: 0842   Incision Properties Date First Assessed: 04/23/15 Time First Assessed: 3545 Location: Groin Location Orientation: Left Present on Admission: No   Selective Debridement - Location periphery of wound bed; callous    Selective Debridement - Tools Used Forceps;Scissors   Selective Debridement - Tissue Removed callous and slough   Wound Therapy - Clinical Statement Great toe had increased redness initially upon taking bandage off.  This decreased somewhat during treatment but not totally.  A slight smell was noted upon removal of dressing therefore changed dressing from xeroform to silver.     Wound Therapy - Functional Problem List non-healing wound    Factors Delaying/Impairing Wound Healing Altered sensation;Diabetes Mellitus;Infection - systemic/local;Multiple medical problems;Vascular compromise   Hydrotherapy Plan Debridement;Dressing change   Wound Therapy - Current Recommendations PT   Wound Plan assess redness of great toe and if there is any odor of drainage next treatment if so pt MD should be contacted re antibiotic.    Dressing  changed to silver, 2x2, and 2" kling    Decrease Necrotic Tissue to 20%   Decrease Necrotic Tissue - Progress Partly met   Increase Granulation Tissue to 80%   Increase Granulation Tissue - Progress Progressing toward goal   Decrease Length/Width/Depth by (cm) 1.0   Decrease Length/Width/Depth - Progress Not progressing   Improve Drainage Characteristics Min   Improve Drainage Characteristics - Progress Progressing toward goal   Patient/Family will be able to  independent in dressing    Patient/Family Instruction Goal - Progress Progressing toward goal   Additional  Wound Therapy Goal full wound closure   Additional Wound Therapy Goal - Progress Progressing toward goal   Goals/treatment plan/discharge plan were made with and agreed upon by patient/family Yes                              Problem List Patient Active Problem List   Diagnosis Date Noted  . Rectal bleeding 07/28/2015  . Chronic anticoagulation 07/28/2015  . PVD (peripheral vascular disease) ([place])   . S/P angioplasty 04/23/2015  . Peripheral arterial occlusive disease ([place]) 04/23/2015  . PAD (peripheral artery disease) ([place])   . Critical lower limb ischemia   . Toe ulcer, right ([place])   . Type II or unspecified type diabetes mellitus without mention of complication, uncontrolled 11/27/2013  . Acute respiratory failure with hypoxia ([place]) 11/26/2013  . CAP (community acquired pneumonia) 11/26/2013  . CHF exacerbation ([place]) 11/25/2013  . Cardiomyopathy, ischemic 06/13/2013  .  At risk for sudden cardiac death Jun 12, 2013  . S/P cardiac catheterization, Rt & Lt heart cath 06/05/2013 12-Jun-2013  . Acute on chronic combined systolic and diastolic HF (heart failure), NYHA class 3 ([place]) 06/04/2013  . Atrial flutter with rapid ventricular response ([place]) 06/02/2013  . Acute renal failure: Cr up to 1.8 06/02/2013  . Hyperkalemia 06/02/2013    Class: Acute  . LBBB (left bundle branch block) 06/01/2013  . Hypotension- secondary to AF and Diltiazem Rx 06/01/2013  . Obesity (BMI 30-39.9) 06/01/2013  . Acute on chronic diastolic heart failure - due to Afib AVR 06/01/2013  . Atrial fibrillation with RVR- converted with Amiodarone 05/31/2013  . Stroke- Lt brain 2003 04/08/2013  . Hemiplegia affecting right dominant side ([place]) 04/08/2013  . Cataract 04/08/2013  . GERD (gastroesophageal reflux disease) 04/08/2013  . S/P CABG x 3 - 2010 04/08/2013  . S/P AVR-tissue, 2010 04/08/2013  . DM2 (diabetes mellitus, type 2) ([place]) 04/08/2013  . Dyslipidemia 04/08/2013  . SUBACROMIAL  BURSITIS, LEFT 11/05/2009    Rayetta Humphrey, PT CLT [phone] 08/20/2015, 8:49 AM  [facility] [address] [place], [name], [address] Phone: [phone]   Fax:  [phone]  Name: [name] MRN: [id number] Date of Birth: 1946/01/20

## 2015-08-21 ENCOUNTER — Other Ambulatory Visit: Payer: Self-pay | Admitting: Licensed Clinical Social Worker

## 2015-08-21 NOTE — Patient Outreach (Signed)
Assessment: CSW spoke via phone with client on 08/21/15. CSW verified client identity. CSW and client spoke of client needs. Client said that a friend from his church had brought client another heater for his home. Client said he now has 3 heaters for his home and that these heaters are keeping his home warm. He said that representative of [employer] had called him but stated that [employer] did not have funds to [employer] needed by client.  He said he has adequate food supply. He said he has his prescribed medications and is taking medications as prescribed. He said he has appointment with Dr. Gerarda Fraction in February of 2017. CSW and client spoke of client care plan. Client said he will try to attend all scheduled client medical appointments.  He said he is sleeping well. He said he sometimes has to have transport assistance from friends to help him complete needed errands or to go to scheduled appointments. CSW encouraged client to attend all scheduled client medical appointments. CSW encouraged client to call CSW at [phone] as needed for social work support. CSW thanked client for phone call with CSW on 08/21/15.  Plan: Client to attend all scheduled client medical appointments in next 30 days. CSW to call client in 4 weeks to assess client needs.  Norva Riffle.Alyvia Derk MSW, LCSW Licensed Clinical Social Worker [facility] Care Management [phone]

## 2015-08-25 ENCOUNTER — Other Ambulatory Visit: Payer: Self-pay

## 2015-08-25 ENCOUNTER — Ambulatory Visit (HOSPITAL_COMMUNITY): Payer: Medicare Other | Admitting: Physical Therapy

## 2015-08-25 DIAGNOSIS — R2681 Unsteadiness on feet: Secondary | ICD-10-CM | POA: Diagnosis not present

## 2015-08-25 DIAGNOSIS — S91109D Unspecified open wound of unspecified toe(s) without damage to nail, subsequent encounter: Secondary | ICD-10-CM | POA: Diagnosis not present

## 2015-08-25 DIAGNOSIS — R262 Difficulty in walking, not elsewhere classified: Secondary | ICD-10-CM | POA: Diagnosis not present

## 2015-08-25 NOTE — Therapy (Signed)
[place], [name], [address] Phone: [phone]   Fax:  [phone]  Wound Care Therapy  Patient Details  Name: [name] MRN: [id number] Date of Birth: Oct 06, 1945 No Data Recorded  Encounter Date: 08/25/2015      PT End of Session - 08/25/15 0930    Visit Number 34   Number of Visits 40   Date for PT Re-Evaluation 08/24/14   Authorization Type [employer] Medicare    Authorization Time Period 04/22/15 to 06/21/05   Authorization - Visit Number 17   Authorization - Number of Visits 40   PT Start Time 0848   PT Stop Time 0920   PT Time Calculation (min) 32 min   Activity Tolerance Patient tolerated treatment well      Past Medical History  Diagnosis Date  . Hypertension   . Diabetes mellitus     type 2   . History of valve replacement 2010    [facility] Ease bioprosthetic 32mm  . Thyroid disease   . Stroke [facility]) 2003    R-sided weakness & upper extremity swelling   . Coronary artery disease   . Dyslipidemia   . At risk for sudden cardiac death 05-Jul-2013  . PAF (paroxysmal atrial fibrillation) ([place]) 05/2013    converted with amiodarone to SR  . Chronic anticoagulation 05/2013    started  . S/P cardiac catheterization, Rt & Lt heart cath 06/05/2013 07/05/2013  . S/P CABG x 3 2010  . GERD (gastroesophageal reflux disease)   . Cataract   . Chronic kidney disease     Patient reports he is seeing Kidney doctor in September    Past Surgical History  Procedure Laterality Date  . Coronary artery bypass graft  01/26/2009    LIMA to LAD, SVG to circumflex, SVG to PDA  . Aortic valve replacement  01/26/2009    [facility] Ease bioprosthetic 50mm valve  . Transthoracic echocardiogram  09/2011    grade 1 diastolic dysfunction; increasing valve gradient; calcified MV annulus   . Cardiac catheterization  06/05/2013    native 3 vessel disease; patent LIMA to LAD, SVG to OM and SVG to PDA; Elevated  right and left heart filling pressures, with predominantly pulmonary venous hypertension, normal PVR  . Transthoracic echocardiogram  06/03/2013    EF 25-30%, mod conc. hypertrophy, grade 3 diastolic dysfunction; LA mod dilated; calcified MV annulus; transaortic gradients are normal for the bioprosthetic valve; inf vena cava dilated (elevated CVP) - LifeVest  . Left and right heart catheterization with coronary angiogram N/A 06/05/2013    Procedure: LEFT AND RIGHT HEART CATHETERIZATION WITH CORONARY ANGIOGRAM;  Surgeon: Leonie Man, MD;  Location: [facility] CATH LAB;  Service: Cardiovascular;  Laterality: N/A;  . Graft(s) angiogram  06/05/2013    Procedure: GRAFT(S) Cyril Loosen;  Surgeon: Leonie Man, MD;  Location: [facility] CATH LAB;  Service: Cardiovascular;;  . Colonoscopy  2007    Dr. Aviva Signs: internal hemorrhoids    There were no vitals filed for this visit.  Visit Diagnosis:  Open toe wound, subsequent encounter  Difficulty walking  Unsteadiness                 Wound Therapy - 08/25/15 0921    Subjective Pt states that the dressing felt good.e was wrapped too tightly last treatment   Patient and Family Stated Goals wound to heal    Pain Assessment No/denies pain   Wound Properties Date First Assessed:  06/23/15 Time First Assessed: 0900 Wound Type: Other (Comment) Location: Toe (Comment  which one) Location Orientation: Right Wound Description (Comments): Lateral aspect of Rt great toe Present on Admission: No   Dressing Type Gauze (Comment);Silver dressings   Dressing Changed Changed   Dressing Status Intact;Old drainage   Dressing Change Frequency PRN   Site / Wound Assessment Red;Yellow   % Wound base Red or Granulating 20%   % Wound base Yellow 80%   Peri-wound Assessment Edema;Erythema (blanchable)   Wound Length (cm) 1.7 cm  wound bed is muchy   Wound Width (cm) 1.5 cm   Wound Depth (cm) 0.3 cm   Margins Epibole (rolled edges)   Drainage Amount Moderate    Drainage Description Serosanguineous   Treatment Cleansed;Debridement (Selective)   Incision Properties Date First Assessed: 04/23/15 Time First Assessed: 1738 Location: Groin Location Orientation: Left Present on Admission: No   Selective Debridement - Location periphery of wound bed; callous    Selective Debridement - Tools Used Forceps;Scissors   Selective Debridement - Tissue Removed callous and slough   Wound Therapy - Clinical Statement Great toe redness continues.  Wound has decreased granulation, increased redness and size has increased since last treatment.  Attempted to call MD re antibiotic but office is closed.  Will continue to call office and attept to contact MD via epic. Pt may need darco boot to keep pressure off of  medial aspect of great toe.    Wound Therapy - Functional Problem List non-healing wound    Factors Delaying/Impairing Wound Healing Altered sensation;Diabetes Mellitus;Infection - systemic/local;Multiple medical problems;Vascular compromise   Hydrotherapy Plan Debridement;Dressing change   Wound Therapy - Current Recommendations PT   Wound Plan assess redness of great toe and if there is any odor of drainage next treatment if so pt MD should be contacted re antibiotic.    Dressing  changed to silver hydrofiber, 2x2, and 2" kling    Decrease Necrotic Tissue to 20%   Decrease Necrotic Tissue - Progress Not progressing   Increase Granulation Tissue to 80%   Increase Granulation Tissue - Progress Progressing toward goal   Decrease Length/Width/Depth by (cm) 1.0   Decrease Length/Width/Depth - Progress Not progressing   Improve Drainage Characteristics Min   Improve Drainage Characteristics - Progress Not progressing   Patient/Family will be able to  independent in dressing    Patient/Family Instruction Goal - Progress Not progressing   Additional Wound Therapy Goal full wound closure   Additional Wound Therapy Goal - Progress Not progressing   Goals/treatment  plan/discharge plan were made with and agreed upon by patient/family Yes                 PT Education - 08/25/15 0930    Education provided Yes   Education Details The need for antibiotic.     Person(s) Educated Patient   Methods Explanation   Comprehension Verbalized understanding                     Problem List Patient Active Problem List   Diagnosis Date Noted  . Rectal bleeding 07/28/2015  . Chronic anticoagulation 07/28/2015  . PVD (peripheral vascular disease) ([place])   . S/P angioplasty 04/23/2015  . Peripheral arterial occlusive disease ([place]) 04/23/2015  . PAD (peripheral artery disease) ([place])   . Critical lower limb ischemia   . Toe ulcer, right ([place])   . Type II or unspecified type diabetes mellitus without mention of complication, uncontrolled 11/27/2013  .  Acute respiratory failure with hypoxia ([place]) 11/26/2013  . CAP (community acquired pneumonia) 11/26/2013  . CHF exacerbation ([place]) 11/25/2013  . Cardiomyopathy, ischemic 06/13/2013  . At risk for sudden cardiac death 07/07/2013  . S/P cardiac catheterization, Rt & Lt heart cath 06/05/2013 July 07, 2013  . Acute on chronic combined systolic and diastolic HF (heart failure), NYHA class 3 ([place]) 06/04/2013  . Atrial flutter with rapid ventricular response ([place]) 06/02/2013  . Acute renal failure: Cr up to 1.8 06/02/2013  . Hyperkalemia 06/02/2013    Class: Acute  . LBBB (left bundle branch block) 06/01/2013  . Hypotension- secondary to AF and Diltiazem Rx 06/01/2013  . Obesity (BMI 30-39.9) 06/01/2013  . Acute on chronic diastolic heart failure - due to Afib AVR 06/01/2013  . Atrial fibrillation with RVR- converted with Amiodarone 05/31/2013  . Stroke- Lt brain 2003 04/08/2013  . Hemiplegia affecting right dominant side ([place]) 04/08/2013  . Cataract 04/08/2013  . GERD (gastroesophageal reflux disease) 04/08/2013  . S/P CABG x 3 - 2010 04/08/2013  . S/P AVR-tissue, 2010 04/08/2013  . DM2  (diabetes mellitus, type 2) ([place]) 04/08/2013  . Dyslipidemia 04/08/2013  . SUBACROMIAL BURSITIS, LEFT 11/05/2009    Rayetta Humphrey, PT CLT [phone] 08/25/2015, 9:31 AM  [facility] [address] [place], [name], [address] Phone: [phone]   Fax:  [phone]  Name: [name] MRN: [id number] Date of Birth: [birth year]

## 2015-08-25 NOTE — Patient Outreach (Signed)
[facility]) Care Management  [place]  08/25/2015   [name] [birth year] [id number]  Subjective: Telephone call to patient for monthly call.  Patient expressed some frustration about living situation.  Patient states that he has not heard from apartment complex in [place].  However, patient states he is thinking about going to live with his sister there as she has offered.  Patient states he has children there and needs some support.  Advised patient to think about his options and let [facility] know of any changes.  He verbalized understanding.  Patient reports he went to the wound center on today.  He states that the top of his toe is healed but he still has an area to the side that needs to heal.  Discussed with patient high protein diet to aid in healing.  He verbalized understanding.    Diabetes: Patient reports that his sugars have been up and down.  This morning it was 202.  Patient reports in the last week it has been as high as 400.  Discussed with patient importance of keeping sugar lower to aid in healing.  He verbalized understanding.    Objective:   Current Medications:  Current Outpatient Prescriptions  Medication Sig Dispense Refill  . acetaminophen (TYLENOL) 500 MG tablet Take 1,000 mg by mouth every 6 (six) hours as needed. For pain/headaches     . ALPRAZolam (XANAX) 1 MG tablet Take 1 mg by mouth 4 (four) times daily as needed for anxiety.     Marland Kitchen amiodarone (PACERONE) 200 MG tablet TAKE ONE (1) TABLET EACH DAY 120 tablet 2  . apixaban (ELIQUIS) 5 MG TABS tablet Take 1 tablet (5 mg total) by mouth 2 (two) times daily. 180 tablet 1  . atorvastatin (LIPITOR) 20 MG tablet Take 1 tablet (20 mg total) by mouth daily at 6 PM. 90 tablet 1  . celecoxib (CELEBREX) 200 MG capsule Take 1 capsule by mouth 2 (two) times daily.    . clopidogrel (PLAVIX) 75 MG tablet Take 1 tablet (75 mg total) by mouth daily. 30 tablet 3  . docusate sodium (COLACE) 100 MG  capsule Take 100 mg by mouth 2 (two) times daily.      . fenofibrate (TRICOR) 145 MG tablet Take 145 mg by mouth daily.    . ferrous sulfate 325 (65 FE) MG EC tablet Take 325 mg by mouth every morning.    . gabapentin (NEURONTIN) 300 MG capsule Take 300 mg by mouth 2 (two) times daily.     Marland Kitchen HYDROcodone-acetaminophen (NORCO) 10-325 MG per tablet Take 1 tablet by mouth as needed for severe pain.     . hydrocortisone (PROCTOSOL HC) 2.5 % rectal cream Place 1 application rectally 2 (two) times daily. For 10 days 30 g 0  . insulin glargine (LANTUS) 100 UNIT/ML injection Inject 60-75 Units into the skin 2 (two) times daily. Takes 75 units in the morning and 60 units in the evening    . insulin lispro (HUMALOG) 100 UNIT/ML injection Inject 35-50 Units into the skin 3 (three) times daily before meals.     Marland Kitchen levothyroxine (SYNTHROID, LEVOTHROID) 25 MCG tablet Take 25 mcg by mouth daily.      . metoprolol succinate (TOPROL-XL) 25 MG 24 hr tablet Take 1 tablet (25 mg total) by mouth daily. 90 tablet 1  . pantoprazole (PROTONIX) 40 MG tablet Take 1 tablet (40 mg total) by mouth 2 (two) times daily. 180 tablet 1  . polyethylene  glycol-electrolytes (NULYTELY/GOLYTELY) 420 G solution Take 4,000 mLs by mouth once. 4000 mL 0  . silver sulfADIAZINE (SILVADENE) 1 % cream Apply 1 application topically 4 (four) times daily.    Marland Kitchen torsemide (DEMADEX) 10 MG tablet Take 1 tablet (10 mg total) by mouth daily. 30 tablet 11  . doxycycline (VIBRA-TABS) 100 MG tablet Take 100 mg by mouth 2 (two) times daily. Reported on 08/25/2015    . Omega-3 Fatty Acids (FISH OIL PO) Take 1 capsule by mouth 2 (two) times daily.      No current facility-administered medications for this visit.    Functional Status:  In your present state of health, do you have any difficulty performing the following activities: 08/21/2015 06/29/2015  Hearing? N N  Vision? N N  Difficulty concentrating or making decisions? N N  Walking or climbing stairs?  - N  Dressing or bathing? N N  Doing errands, shopping? N N  Preparing Food and eating ? N N  Using the Toilet? N N  In the past six months, have you accidently leaked urine? N N  Do you have problems with loss of bowel control? N N  Managing your Medications? N N  Managing your Finances? N N  Housekeeping or managing your Housekeeping? [name]    Fall/Depression Screening: PHQ 2/9 Scores 08/25/2015 08/21/2015 07/30/2015 07/27/2015 06/29/2015 06/04/2015 04/29/2015  PHQ - 2 Score 0 0 0 0 0 1 1  PHQ- 9 Score - - - - - - -    Assessment: Patient continues to benefit from health coach outreach for disease management and support.  Plan:  [facility] CM Care Plan Problem Two        Most Recent Value   Care Plan Problem Two  Uncontrolled diabetes as evidenced by elevated CBGs, HGA1C and nonhealing wound.   Role Documenting the Problem Two  [place] for Problem Two  Active   Interventions for Problem Two Long Term Goal   RN Health Coach discussed with patient signs of wound infection and importance of protein in his diet to promote healing.    [facility] Long Term Goal (31-90) days  Patient stated goal "I want my foot to heal", Patient foot will show improvement to wound over next 90 days   [facility] Long Term Goal Start Date  08/25/15 [Goal continued]   [facility] CM Short Term Goal #3 (0-30 days)  Patient CBG's will be under 300 within the next 30 days   [facility] CM Short Term Goal #3 Start Date  08/25/15 [goal continued]   Interventions for Short Term Goal #3  [place] reemphasized with patient the importance of limiting carbohydrates and sweets.  Also discussed goal of keeping sugars less than 300.        RN Health Coach will contact patient within one month and patient agrees to next outreach.  [name], RN, MSN [place] [phone]

## 2015-08-27 DIAGNOSIS — E1151 Type 2 diabetes mellitus with diabetic peripheral angiopathy without gangrene: Secondary | ICD-10-CM | POA: Diagnosis not present

## 2015-08-27 DIAGNOSIS — L03119 Cellulitis of unspecified part of limb: Secondary | ICD-10-CM | POA: Diagnosis not present

## 2015-08-27 DIAGNOSIS — G894 Chronic pain syndrome: Secondary | ICD-10-CM | POA: Diagnosis not present

## 2015-08-27 DIAGNOSIS — I739 Peripheral vascular disease, unspecified: Secondary | ICD-10-CM | POA: Diagnosis not present

## 2015-08-27 DIAGNOSIS — Z1389 Encounter for screening for other disorder: Secondary | ICD-10-CM | POA: Diagnosis not present

## 2015-08-28 ENCOUNTER — Ambulatory Visit (HOSPITAL_COMMUNITY): Payer: Medicare Other | Admitting: Physical Therapy

## 2015-08-28 DIAGNOSIS — R2681 Unsteadiness on feet: Secondary | ICD-10-CM

## 2015-08-28 DIAGNOSIS — R262 Difficulty in walking, not elsewhere classified: Secondary | ICD-10-CM | POA: Diagnosis not present

## 2015-08-28 DIAGNOSIS — S91109D Unspecified open wound of unspecified toe(s) without damage to nail, subsequent encounter: Secondary | ICD-10-CM

## 2015-08-28 NOTE — Therapy (Signed)
[place], [name], [address] Phone: [phone]   Fax:  [phone]  Wound Care Therapy  Patient Details  Name: [name] MRN: [id number] Date of Birth: [birth year] No Data Recorded  Encounter Date: 08/28/2015 Reassessment     PT End of Session - 08/28/15 1013    Visit Number 35   Date for PT Re-Evaluation 09/27/15   Authorization Type UHC Medicare    Authorization Time Period 04/22/15 to 06/21/05   Authorization - Visit Number 59  G code and reassessment done on visit 50   Authorization - Number of Visits 45   PT Start Time 0900   PT Stop Time 0950   PT Time Calculation (min) 50 min   Activity Tolerance Patient tolerated treatment well   Behavior During Therapy [facility] for tasks assessed/performed      Past Medical History  Diagnosis Date  . Hypertension   . Diabetes mellitus     type 2   . History of valve replacement 2010    [facility] Ease bioprosthetic 48mm  . Thyroid disease   . Stroke [facility]) 2003    R-sided weakness & upper extremity swelling   . Coronary artery disease   . Dyslipidemia   . At risk for sudden cardiac death 2013-06-14  . PAF (paroxysmal atrial fibrillation) ([place]) 05/2013    converted with amiodarone to SR  . Chronic anticoagulation 05/2013    started  . S/P cardiac catheterization, Rt & Lt heart cath 06/05/2013 2013/06/14  . S/P CABG x 3 2010  . GERD (gastroesophageal reflux disease)   . Cataract   . Chronic kidney disease     Patient reports he is seeing Kidney doctor in September    Past Surgical History  Procedure Laterality Date  . Coronary artery bypass graft  01/26/2009    LIMA to LAD, SVG to circumflex, SVG to PDA  . Aortic valve replacement  01/26/2009    [facility] Ease bioprosthetic 85mm valve  . Transthoracic echocardiogram  09/2011    grade 1 diastolic dysfunction; increasing valve gradient; calcified MV annulus   . Cardiac catheterization   06/05/2013    native 3 vessel disease; patent LIMA to LAD, SVG to OM and SVG to PDA; Elevated right and left heart filling pressures, with predominantly pulmonary venous hypertension, normal PVR  . Transthoracic echocardiogram  06/03/2013    EF 25-30%, mod conc. hypertrophy, grade 3 diastolic dysfunction; LA mod dilated; calcified MV annulus; transaortic gradients are normal for the bioprosthetic valve; inf vena cava dilated (elevated CVP) - LifeVest  . Left and right heart catheterization with coronary angiogram N/A 06/05/2013    Procedure: LEFT AND RIGHT HEART CATHETERIZATION WITH CORONARY ANGIOGRAM;  Surgeon: Leonie Man, MD;  Location: [facility] CATH LAB;  Service: Cardiovascular;  Laterality: N/A;  . Graft(s) angiogram  06/05/2013    Procedure: GRAFT(S) Cyril Loosen;  Surgeon: Leonie Man, MD;  Location: [facility] CATH LAB;  Service: Cardiovascular;;  . Colonoscopy  2007    Dr. Aviva Signs: internal hemorrhoids    There were no vitals filed for this visit.  Visit Diagnosis:  Open toe wound, subsequent encounter  Difficulty walking  Unsteadiness                 Wound Therapy - 08/28/15 0945    Subjective Pt states that he went to his primary to get a prescription for antibiotics but has not picked them up yet.  Pt will now  be followed by his primary care MD rather than the orthopedist for his wound.    Patient and Family Stated Goals wound to heal    Wound Properties Date First Assessed: 06/23/15 Time First Assessed: 0900 Wound Type: Other (Comment) Location: Toe (Comment  which one) Location Orientation: Right Wound Description (Comments): Lateral aspect of Rt great toe Present on Admission: No   Dressing Type Gauze (Comment);Silver hydrofiber   Dressing Changed Changed   Dressing Status Intact;Old drainage   Dressing Change Frequency Every 3 days   Site / Wound Assessment Friable;Granulation tissue;Other (Comment)  other = center of wound is white.    % Wound base Red or  Granulating 20%   % Wound base Other (Comment) 80%  white.    Peri-wound Assessment Erythema (blanchable)  callous ring of .5 cm around wound bed.    Wound Length (cm) 1.6 cm  was 1,3 ib 07/28/2015   Wound Width (cm) 1.5 cm  was .7 on 07/28/2015   Wound Depth (cm) 0.2 cm  was 0.0 on 07/28/2015    Undermining (cm) allong edges    Margins Unattached edges (unapproximated)   Drainage Amount Moderate   Drainage Description Serosanguineous   Treatment Cleansed;Debridement (Selective)   Wound Properties Date First Assessed: 08/28/15 Wound Type: Diabetic ulcer Location: Toe (Comment  which one) Location Orientation: Right Wound Description (Comments): Medial aspect of Rt great toe just supeior to larger wound  Present on Admission: Yes   Dressing Type Gauze (Comment);Silver hydrofiber   Dressing Changed Changed   Dressing Status Old drainage   Dressing Change Frequency Every 3 days   Site / Wound Assessment Friable;Granulation tissue   % Wound base Red or Granulating 80%   % Wound base Yellow 20%   Peri-wound Assessment Erythema (blanchable)   Wound Length (cm) 0.4 cm   Wound Width (cm) 0.6 cm   Margins Attached edges (approximated)   Drainage Amount Scant   Treatment Cleansed;Debridement (Selective)   Incision Properties Date First Assessed: 04/23/15 Time First Assessed: 1738 Location: Groin Location Orientation: Left Present on Admission: No   Selective Debridement - Location wound bed and callous surrounding wound.    Selective Debridement - Tools Used Forceps;Scissors   Selective Debridement - Tissue Removed slough and callous   Wound Therapy - Clinical Statement Pt is a 70 yo male who is un unstable diabetic.  He also has decreased arterial flow and has had stents placed in his LE within the last two months.  He has multiple non-healing wounds on his great toe that are progressing in nature.  He continues to benefit from skilled PT to attempt to maintain a healing environment to allow  his wound to heal.  Pt has been advised by a surgeion that he is at risk of needing an amputation if he can not get his wound to heal.  The patient  is currently changing MD for wound care.  Pt has recieved but has not picked up his prescription for antibiotics.  PT entire great toe is red and medial aspect of great to is very soft with palpation indicative of loss of integrity ot soft tissue underneath.    Wound Therapy - Functional Problem List Non-healing wound  affecting gait.    Factors Delaying/Impairing Wound Healing Altered sensation;Diabetes Mellitus;Infection - systemic/local;Immobility;Multiple medical problems;Polypharmacy;Vascular compromise   Hydrotherapy Plan Debridement;Dressing change;Patient/family education   Wound Therapy - Frequency --  2x a week    Wound Therapy - Current Recommendations PT   Wound Plan  Pt will begin antibiotic today assess color of great toe next treatment if toe is still red MD may want a culture.    Dressing  vaseline to periphery of wound silverhydrofiber to wound bed followed by 2x2 and kling.    Decrease Necrotic Tissue to 20%   Decrease Necrotic Tissue - Progress Not progressing   Increase Granulation Tissue to 80%   Increase Granulation Tissue - Progress --  not progressing   Decrease Length/Width/Depth by (cm) 1.0   Decrease Length/Width/Depth - Progress Not progressing   Improve Drainage Characteristics Min   Improve Drainage Characteristics - Progress Not progressing   Patient/Family will be able to  Independent in dressing change   Patient/Family Instruction Goal - Progress Not progressing  PT states he is not able to reach toe good enough to dress   Additional Wound Therapy Goal full healing of wound   Additional Wound Therapy Goal - Progress Not progressing   Goals/treatment plan/discharge plan were made with and agreed upon by patient/family Yes                 PT Education - 09-19-15 1004    Education provided Yes    Education Details The importance of picking up his antibiotic.   Person(s) Educated Patient   Methods Explanation   Comprehension Verbalized understanding                      G-Codes - Sep 19, 2015 1012    Functional Assessment Tool Used based on skilled clinical assessment of wound healling    Functional Limitation Other PT subsequent   Other PT Primary Current Status [id number]) At least 40 percent but less than 60 percent impaired, limited or restricted   Other PT Primary Goal Status [id number]) At least 1 percent but less than 20 percent impaired, limited or restricted       Problem List Patient Active Problem List   Diagnosis Date Noted  . Rectal bleeding 07/28/2015  . Chronic anticoagulation 07/28/2015  . PVD (peripheral vascular disease) ([place])   . S/P angioplasty 04/23/2015  . Peripheral arterial occlusive disease ([place]) 04/23/2015  . PAD (peripheral artery disease) ([place])   . Critical lower limb ischemia   . Toe ulcer, right ([place])   . Type II or unspecified type diabetes mellitus without mention of complication, uncontrolled 11/27/2013  . Acute respiratory failure with hypoxia ([place]) 11/26/2013  . CAP (community acquired pneumonia) 11/26/2013  . CHF exacerbation ([place]) 11/25/2013  . Cardiomyopathy, ischemic 06/13/2013  . At risk for sudden cardiac death 06/29/2013  . S/P cardiac catheterization, Rt & Lt heart cath 06/05/2013 2013-06-29  . Acute on chronic combined systolic and diastolic HF (heart failure), NYHA class 3 ([place]) 06/04/2013  . Atrial flutter with rapid ventricular response ([place]) 06/02/2013  . Acute renal failure: Cr up to 1.8 06/02/2013  . Hyperkalemia 06/02/2013    Class: Acute  . LBBB (left bundle branch block) 06/01/2013  . Hypotension- secondary to AF and Diltiazem Rx 06/01/2013  . Obesity (BMI 30-39.9) 06/01/2013  . Acute on chronic diastolic heart failure - due to Afib AVR 06/01/2013  . Atrial fibrillation with RVR- converted with Amiodarone  05/31/2013  . Stroke- Lt brain 2003 04/08/2013  . Hemiplegia affecting right dominant side ([place]) 04/08/2013  . Cataract 04/08/2013  . GERD (gastroesophageal reflux disease) 04/08/2013  . S/P CABG x 3 - 2010 04/08/2013  . S/P AVR-tissue, 2010 04/08/2013  . DM2 (diabetes mellitus, type 2) ([place]) 04/08/2013  . Dyslipidemia  04/08/2013  . SUBACROMIAL BURSITIS, LEFT 11/05/2009    Rayetta Humphrey, PT CLT [phone] 08/28/2015, 10:14 AM  [place], [name], [address] Phone: [phone]   Fax:  [phone]  Name: [name] MRN: [id number] Date of Birth: 12-14-1945

## 2015-08-31 ENCOUNTER — Telehealth: Payer: Self-pay | Admitting: Internal Medicine

## 2015-08-31 NOTE — Telephone Encounter (Signed)
Advised OK to fill Plavix prescribed by Dr. Gerarda Fraction (concern for pt also being on Eliquis). Pt has historically been on both, acknowledged by Dr. Debara Pickett - this is refill, not new med. Advised OK. No further questions.

## 2015-09-01 ENCOUNTER — Ambulatory Visit (HOSPITAL_COMMUNITY): Payer: Medicare Other

## 2015-09-01 DIAGNOSIS — S91109D Unspecified open wound of unspecified toe(s) without damage to nail, subsequent encounter: Secondary | ICD-10-CM

## 2015-09-01 DIAGNOSIS — R2681 Unsteadiness on feet: Secondary | ICD-10-CM | POA: Diagnosis not present

## 2015-09-01 DIAGNOSIS — R262 Difficulty in walking, not elsewhere classified: Secondary | ICD-10-CM

## 2015-09-01 NOTE — Therapy (Signed)
[place], [name], [address] Phone: [phone]   Fax:  [phone]  Wound Care Therapy  Patient Details  Name: [name] MRN: [id number] Date of Birth: [birth year] No Data Recorded  Encounter Date: 09/01/2015      PT End of Session - 09/01/15 1042    Visit Number 36   Number of Visits 50   Date for PT Re-Evaluation 09/27/15   Authorization Type UHC Medicare: Gcode and reassessment complete visit 2   Authorization Time Period 04/22/15 to 06/21/05   Authorization - Visit Number 93   Authorization - Number of Visits 45   PT Start Time 0932   PT Stop Time 1010   PT Time Calculation (min) 38 min   Activity Tolerance Patient tolerated treatment well   Behavior During Therapy [facility] for tasks assessed/performed      Past Medical History  Diagnosis Date  . Hypertension   . Diabetes mellitus     type 2   . History of valve replacement 2010    [facility] Ease bioprosthetic 94mm  . Thyroid disease   . Stroke [facility]) 2003    R-sided weakness & upper extremity swelling   . Coronary artery disease   . Dyslipidemia   . At risk for sudden cardiac death July 03, 2013  . PAF (paroxysmal atrial fibrillation) ([place]) 05/2013    converted with amiodarone to SR  . Chronic anticoagulation 05/2013    started  . S/P cardiac catheterization, Rt & Lt heart cath 06/05/2013 2013/07/03  . S/P CABG x 3 2010  . GERD (gastroesophageal reflux disease)   . Cataract   . Chronic kidney disease     Patient reports he is seeing Kidney doctor in September    Past Surgical History  Procedure Laterality Date  . Coronary artery bypass graft  01/26/2009    LIMA to LAD, SVG to circumflex, SVG to PDA  . Aortic valve replacement  01/26/2009    [facility] Ease bioprosthetic 52mm valve  . Transthoracic echocardiogram  09/2011    grade 1 diastolic dysfunction; increasing valve gradient; calcified MV annulus   . Cardiac catheterization   06/05/2013    native 3 vessel disease; patent LIMA to LAD, SVG to OM and SVG to PDA; Elevated right and left heart filling pressures, with predominantly pulmonary venous hypertension, normal PVR  . Transthoracic echocardiogram  06/03/2013    EF 25-30%, mod conc. hypertrophy, grade 3 diastolic dysfunction; LA mod dilated; calcified MV annulus; transaortic gradients are normal for the bioprosthetic valve; inf vena cava dilated (elevated CVP) - LifeVest  . Left and right heart catheterization with coronary angiogram N/A 06/05/2013    Procedure: LEFT AND RIGHT HEART CATHETERIZATION WITH CORONARY ANGIOGRAM;  Surgeon: Leonie Man, MD;  Location: [facility] CATH LAB;  Service: Cardiovascular;  Laterality: N/A;  . Graft(s) angiogram  06/05/2013    Procedure: GRAFT(S) Cyril Loosen;  Surgeon: Leonie Man, MD;  Location: [facility] CATH LAB;  Service: Cardiovascular;;  . Colonoscopy  2007    Dr. Aviva Signs: internal hemorrhoids    There were no vitals filed for this visit.  Visit Diagnosis:  Open toe wound, subsequent encounter  Difficulty walking  Unsteadiness      Subjective Assessment - 09/01/15 1011    Subjective No reports of pain, started antibiotics Saturday, continues to report blood drainage through dressings.   Pertinent History Patient reports that this wound started as a small little brown spot on the end of  his toe that just kept getting bigger; he thinks it started about 8 months ago. He reports that it is now slowly getting better but has been healing slow. He states  that it has been getting better.    Currently in Pain? No/denies            Wound Therapy - 09/01/15 1011    Subjective No reports of pain, started antibiotics Saturday, continues to report blood drainage through dressings.   Patient and Family Stated Goals wound to heal    Pain Assessment No/denies pain   Wound Properties Date First Assessed: 08/28/15 Wound Type: Diabetic ulcer Location: Toe (Comment  which one)  Location Orientation: Right Wound Description (Comments): Medial aspect of Rt great toe just supeior to larger wound  Present on Admission: Yes   Dressing Type Gauze (Comment);Silver hydrofiber  Vaseline perimeter, silver hydrofiber with gauze   Dressing Changed Changed   Dressing Status Old drainage   Dressing Change Frequency Every 3 days   Site / Wound Assessment Friable;Granulation tissue   % Wound base Red or Granulating 80%   % Wound base Yellow 20%   Margins Attached edges (approximated)   Drainage Amount Scant   Drainage Description Serosanguineous   Treatment Cleansed;Debridement (Selective)   Wound Properties Date First Assessed: 06/23/15 Time First Assessed: 0900 Wound Type: Other (Comment) Location: Toe (Comment  which one) Location Orientation: Right Wound Description (Comments): Lateral aspect of Rt great toe Present on Admission: No   Dressing Type Gauze (Comment);Silver hydrofiber  vaseline perimeter, silver hydrofiber, gauze   Dressing Changed Changed   Dressing Status Intact;Old drainage   Dressing Change Frequency Every 3 days   Site / Wound Assessment Friable;Granulation tissue;Other (Comment)   % Wound base Red or Granulating 20%   % Wound base Other (Comment) 80%  white; macerated edges   Peri-wound Assessment Erythema (blanchable)   Margins Unattached edges (unapproximated)   Drainage Amount Moderate   Drainage Description Serosanguineous   Treatment Cleansed;Debridement (Selective)   Incision Properties Date First Assessed: 04/23/15 Time First Assessed: 1738 Location: Groin Location Orientation: Left Present on Admission: No   Selective Debridement - Location wound bed and callous surrounding wound.    Selective Debridement - Tools Used Forceps;Scalpel   Selective Debridement - Tissue Removed slough and callous   Wound Therapy - Clinical Statement Noted new blackspot proximal great toe, PT informed of new spot.  Wound macerated edges with white center of wound,  very adherent slough.  Selectiive debridement for removal of slough, dry skin and callous perimeter of wound.  Increased blood to area following debridement for removal of slough.  Redness continues of great toe, pt reports he started antibiotics last Saturday.   Wound Therapy - Functional Problem List Non-healing wound  affecting gait.    Factors Delaying/Impairing Wound Healing Altered sensation;Diabetes Mellitus;Infection - systemic/local;Immobility;Multiple medical problems;Polypharmacy;Vascular compromise   Hydrotherapy Plan Debridement;Dressing change;Patient/family education   Wound Therapy - Frequency --  2x/ week   Wound Therapy - Current Recommendations PT   Wound Plan Assess redness next session with antibiotics, MD may want a culture   Dressing  vaseline to periphery of wound silverhydrofiber to wound bed followed by 2x2 and kling.            Problem List Patient Active Problem List   Diagnosis Date Noted  . Rectal bleeding 07/28/2015  . Chronic anticoagulation 07/28/2015  . PVD (peripheral vascular disease) ([place])   . S/P angioplasty 04/23/2015  . Peripheral arterial occlusive disease (  [place]) 04/23/2015  . PAD (peripheral artery disease) ([place])   . Critical lower limb ischemia   . Toe ulcer, right ([place])   . Type II or unspecified type diabetes mellitus without mention of complication, uncontrolled 11/27/2013  . Acute respiratory failure with hypoxia ([place]) 11/26/2013  . CAP (community acquired pneumonia) 11/26/2013  . CHF exacerbation ([place]) 11/25/2013  . Cardiomyopathy, ischemic 06/13/2013  . At risk for sudden cardiac death 2013/07/05  . S/P cardiac catheterization, Rt & Lt heart cath 06/05/2013 July 05, 2013  . Acute on chronic combined systolic and diastolic HF (heart failure), NYHA class 3 ([place]) 06/04/2013  . Atrial flutter with rapid ventricular response ([place]) 06/02/2013  . Acute renal failure: Cr up to 1.8 06/02/2013  . Hyperkalemia 06/02/2013    Class: Acute  .  LBBB (left bundle branch block) 06/01/2013  . Hypotension- secondary to AF and Diltiazem Rx 06/01/2013  . Obesity (BMI 30-39.9) 06/01/2013  . Acute on chronic diastolic heart failure - due to Afib AVR 06/01/2013  . Atrial fibrillation with RVR- converted with Amiodarone 05/31/2013  . Stroke- Lt brain 2003 04/08/2013  . Hemiplegia affecting right dominant side ([place]) 04/08/2013  . Cataract 04/08/2013  . GERD (gastroesophageal reflux disease) 04/08/2013  . S/P CABG x 3 - 2010 04/08/2013  . S/P AVR-tissue, 2010 04/08/2013  . DM2 (diabetes mellitus, type 2) ([place]) 04/08/2013  . Dyslipidemia 04/08/2013  . SUBACROMIAL BURSITIS, LEFT 11/05/2009   Ihor Austin, LPTA; CBIS [phone]  Aldona Lento 09/01/2015, 10:44 AM  [place], [name], [address] Phone: [phone]   Fax:  [phone]  Name: [name] MRN: [id number] Date of Birth: 1945/11/12

## 2015-09-04 ENCOUNTER — Ambulatory Visit (HOSPITAL_COMMUNITY): Payer: Medicare Other | Admitting: Physical Therapy

## 2015-09-04 DIAGNOSIS — S91109D Unspecified open wound of unspecified toe(s) without damage to nail, subsequent encounter: Secondary | ICD-10-CM | POA: Diagnosis not present

## 2015-09-04 DIAGNOSIS — R2681 Unsteadiness on feet: Secondary | ICD-10-CM | POA: Diagnosis not present

## 2015-09-04 DIAGNOSIS — R262 Difficulty in walking, not elsewhere classified: Secondary | ICD-10-CM

## 2015-09-04 NOTE — Therapy (Signed)
[place], [name], [address] Phone: [phone]   Fax:  [phone]  Wound Care Therapy  Patient Details  Name: [name] MRN: [id number] Date of Birth: [birth year] No Data Recorded  Encounter Date: 09/04/2015      PT End of Session - 09/04/15 0852    Visit Number 37   Number of Visits 50   Date for PT Re-Evaluation 09/27/15   Authorization Type [employer] Medicare: Gcode and reassessment complete visit 52   Authorization Time Period 04/22/15 to 06/21/05   Authorization - Visit Number 44   Authorization - Number of Visits 45   PT Start Time 0800   PT Stop Time 0840   PT Time Calculation (min) 40 min      Past Medical History  Diagnosis Date  . Hypertension   . Diabetes mellitus     type 2   . History of valve replacement 2010    [facility] Ease bioprosthetic 55m  . Thyroid disease   . Stroke ([facility] 2003    R-sided weakness & upper extremity swelling   . Coronary artery disease   . Dyslipidemia   . At risk for sudden cardiac death 1Nov 23, 2014 . PAF (paroxysmal atrial fibrillation) ([place] 05/2013    converted with amiodarone to SR  . Chronic anticoagulation 05/2013    started  . S/P cardiac catheterization, Rt & Lt heart cath 06/05/2013 111-23-14 . S/P CABG x 3 2010  . GERD (gastroesophageal reflux disease)   . Cataract   . Chronic kidney disease     Patient reports he is seeing Kidney doctor in September    Past Surgical History  Procedure Laterality Date  . Coronary artery bypass graft  01/26/2009    LIMA to LAD, SVG to circumflex, SVG to PDA  . [facility]  [facility]  . Transthoracic echocardiogram  09/2011    grade 1 diastolic dysfunction; increasing valve gradient; calcified MV annulus   . Cardiac catheterization  06/05/2013    native 3 vessel disease; patent LIMA to LAD, SVG to OM and SVG to PDA; Elevated right and left  heart filling pressures, with predominantly pulmonary venous hypertension, normal PVR  . Transthoracic echocardiogram  06/03/2013    EF 25-30%, mod conc. hypertrophy, grade 3 diastolic dysfunction; LA mod dilated; calcified MV annulus; transaortic gradients are normal for the bioprosthetic valve; inf vena cava dilated (elevated CVP) - LifeVest  . Left and right heart catheterization with coronary angiogram N/A 06/05/2013    Procedure: LEFT AND RIGHT HEART CATHETERIZATION WITH CORONARY ANGIOGRAM;  Surgeon: DaLeonie ManMD;  [facility];  Service: Cardiovascular;  Laterality: N/A;  . Graft(s) angiogram  06/05/2013    Procedure: GRAFT(S) ANCyril Loosen Surgeon: DaLeonie ManMD;  [facility];  Service: Cardiovascular;;  . Colonoscopy  2007    Dr. MaAviva Signsinternal hemorrhoids    There were no vitals filed for this visit.  Visit Diagnosis:  Open toe wound, subsequent encounter  Difficulty walking  Unsteadiness                 Wound Therapy - 09/04/15 0844    Subjective Pt states that his toe has a slight pain to it now.   Patient and Family Stated Goals wound to heal    Pain Assessment 0-10   Pain Score 2    Wound Properties Date First  Assessed: 08/28/15 Wound Type: Diabetic ulcer Location: Toe (Comment  which one) Location Orientation: Right Wound Description (Comments): Medial aspect of Rt great toe just supeior to larger wound  Present on Admission: Yes   Dressing Type Gauze (Comment);Silver hydrofiber  Vaseline perimeter, silver hydrofiber with gauze   Dressing Changed Changed   Dressing Status Old drainage   Dressing Change Frequency Every 3 days   Site / Wound Assessment Friable;Granulation tissue   % Wound base Red or Granulating 20%   % Wound base Yellow 80%   Margins Attached edges (approximated)   Drainage Amount Scant   Drainage Description Serosanguineous   Treatment Cleansed;Debridement (Selective)   Wound Properties Date First  Assessed: 06/23/15 Time First Assessed: 0900 Wound Type: Other (Comment) Location: Toe (Comment  which one) Location Orientation: Right Wound Description (Comments): Lateral aspect of Rt great toe Present on Admission: No   Dressing Type Gauze (Comment);Silver dressings  vaseline perimeter, silver hydrofiber, gauze   Dressing Changed Changed   Dressing Status Intact;Old drainage   Dressing Change Frequency Every 3 days   Site / Wound Assessment Friable;Granulation tissue;Other (Comment)   % Wound base Red or Granulating 20%   % Wound base Other (Comment) 80%  white; macerated edges   Peri-wound Assessment Erythema (blanchable)   Margins Unattached edges (unapproximated)   Drainage Amount Minimal   Drainage Description Serosanguineous   Treatment Cleansed;Debridement (Selective)   Incision Properties Date First Assessed: 04/23/15 Time First Assessed: 1738 Location: Groin Location Orientation: Left Present on Admission: No   Selective Debridement - Location wound bed and callous surrounding wound.    Selective Debridement - Tools Used Forceps;Scalpel   Selective Debridement - Tissue Removed slough and callous   Wound Therapy - Clinical Statement Blackened area next to nail bed has not changed.  Changed to silver dressing due to decreased drainage.  Wound continues to be mushy in the center. Advised pt to keep shoes off as much as possible.   Difficulty to debried secondary to tissue being adherent.    Wound Therapy - Functional Problem List Non-healing wound  affecting gait.    Factors Delaying/Impairing Wound Healing Altered sensation;Diabetes Mellitus;Infection - systemic/local;Immobility;Multiple medical problems;Polypharmacy;Vascular compromise   Hydrotherapy Plan Debridement;Dressing change;Patient/family education   Wound Therapy - Frequency --  2x/ week   Wound Therapy - Current Recommendations PT   Wound Plan Assess redness next session with antibiotics, MD may want a culture    Dressing  vaseline to periphery of wound silver to wound bed followed by 2x2 and kling.    Decrease Necrotic Tissue to 20%   Decrease Necrotic Tissue - Progress Not progressing   Increase Granulation Tissue to 80%   Increase Granulation Tissue - Progress Not met                              Problem List Patient Active Problem List   Diagnosis Date Noted  . Rectal bleeding 07/28/2015  . Chronic anticoagulation 07/28/2015  . PVD (peripheral vascular disease) ([place])   . S/P angioplasty 04/23/2015  . Peripheral arterial occlusive disease ([place]) 04/23/2015  . PAD (peripheral artery disease) ([place])   . Critical lower limb ischemia   . Toe ulcer, right ([place])   . Type II or unspecified type diabetes mellitus without mention of complication, uncontrolled 11/27/2013  . Acute respiratory failure with hypoxia ([place]) 11/26/2013  . CAP (community acquired pneumonia) 11/26/2013  . CHF exacerbation ([place]) 11/25/2013  . Cardiomyopathy,  ischemic 06/13/2013  . At risk for sudden cardiac death 06/14/13  . S/P cardiac catheterization, Rt & Lt heart cath 06/05/2013 June 14, 2013  . Acute on chronic combined systolic and diastolic HF (heart failure), NYHA class 3 ([place]) 06/04/2013  . Atrial flutter with rapid ventricular response ([place]) 06/02/2013  . Acute renal failure: Cr up to 1.8 06/02/2013  . Hyperkalemia 06/02/2013    Class: Acute  . LBBB (left bundle branch block) 06/01/2013  . Hypotension- secondary to AF and Diltiazem Rx 06/01/2013  . Obesity (BMI 30-39.9) 06/01/2013  . Acute on chronic diastolic heart failure - due to Afib AVR 06/01/2013  . Atrial fibrillation with RVR- converted with Amiodarone 05/31/2013  . Stroke- Lt brain 2003 04/08/2013  . Hemiplegia affecting right dominant side ([place]) 04/08/2013  . Cataract 04/08/2013  . GERD (gastroesophageal reflux disease) 04/08/2013  . S/P CABG x 3 - 2010 04/08/2013  . S/P AVR-tissue, 2010 04/08/2013  . DM2 (diabetes mellitus,  type 2) ([place]) 04/08/2013  . Dyslipidemia 04/08/2013  . SUBACROMIAL BURSITIS, LEFT 11/05/2009    Rayetta Humphrey, PT CLT [phone] 09/04/2015, 8:53 AM  [place], [name], [address] Phone: [phone]   Fax:  [phone]  Name: [name] MRN: [id number] Date of Birth: Aug 18, 1945

## 2015-09-08 ENCOUNTER — Ambulatory Visit (HOSPITAL_COMMUNITY): Payer: Medicare Other | Admitting: Physical Therapy

## 2015-09-08 ENCOUNTER — Telehealth (HOSPITAL_COMMUNITY): Payer: Self-pay | Admitting: Interventional Radiology

## 2015-09-08 ENCOUNTER — Other Ambulatory Visit (HOSPITAL_COMMUNITY): Payer: Self-pay | Admitting: Interventional Radiology

## 2015-09-08 ENCOUNTER — Ambulatory Visit
Admission: RE | Admit: 2015-09-08 | Discharge: 2015-09-08 | Disposition: A | Discharge status: Home / Self Care | Payer: Medicare Other | Source: Ambulatory Visit | Attending: Interventional Radiology | Admitting: Interventional Radiology

## 2015-09-08 DIAGNOSIS — R262 Difficulty in walking, not elsewhere classified: Secondary | ICD-10-CM

## 2015-09-08 DIAGNOSIS — L97519 Non-pressure chronic ulcer of other part of right foot with unspecified severity: Secondary | ICD-10-CM

## 2015-09-08 DIAGNOSIS — I739 Peripheral vascular disease, unspecified: Secondary | ICD-10-CM

## 2015-09-08 DIAGNOSIS — S91109D Unspecified open wound of unspecified toe(s) without damage to nail, subsequent encounter: Secondary | ICD-10-CM | POA: Diagnosis not present

## 2015-09-08 DIAGNOSIS — I70229 Atherosclerosis of native arteries of extremities with rest pain, unspecified extremity: Secondary | ICD-10-CM

## 2015-09-08 DIAGNOSIS — I872 Venous insufficiency (chronic) (peripheral): Secondary | ICD-10-CM | POA: Diagnosis not present

## 2015-09-08 DIAGNOSIS — I998 Other disorder of circulatory system: Secondary | ICD-10-CM

## 2015-09-08 DIAGNOSIS — R2681 Unsteadiness on feet: Secondary | ICD-10-CM | POA: Diagnosis not present

## 2015-09-08 NOTE — Therapy (Signed)
[place], [name], [address] Phone: [phone]   Fax:  [phone]  Wound Care Therapy  Patient Details  Name: [name] MRN: [id number] Date of Birth: November 25, 1945 No Data Recorded  Encounter Date: 09/08/2015      PT End of Session - 09/08/15 0848    Visit Number 38   Number of Visits 50   Date for PT Re-Evaluation 09/27/15   Authorization Time Period 04/22/15 to 06/21/05   Authorization - Visit Number 46   Authorization - Number of Visits 59   PT Start Time 0800   PT Stop Time 0840   PT Time Calculation (min) 40 min   Activity Tolerance Patient tolerated treatment well      Past Medical History  Diagnosis Date  . Hypertension   . Diabetes mellitus     type 2   . History of valve replacement 2010    [facility] Ease bioprosthetic 79m  . Thyroid disease   . Stroke ([facility] 2003    R-sided weakness & upper extremity swelling   . Coronary artery disease   . Dyslipidemia   . At risk for sudden cardiac death 1Oct 30, 2014 . PAF (paroxysmal atrial fibrillation) ([facility] 05/2013    converted with amiodarone to SR  . Chronic anticoagulation 05/2013    started  . S/P cardiac catheterization, Rt & Lt heart cath 06/05/2013 110-30-14 . S/P CABG x 3 2010  . GERD (gastroesophageal reflux disease)   . Cataract   . Chronic kidney disease     Patient reports he is seeing Kidney doctor in September    Past Surgical History  Procedure Laterality Date  . Coronary artery bypass graft  01/26/2009    LIMA to LAD, SVG to circumflex, SVG to PDA  . [facility]  [facility]  . Transthoracic echocardiogram  09/2011    grade 1 diastolic dysfunction; increasing valve gradient; calcified MV annulus   . Cardiac catheterization  06/05/2013    native 3 vessel disease; patent LIMA to LAD, SVG to OM and SVG to PDA; Elevated right and left heart filling pressures,  with predominantly pulmonary venous hypertension, normal PVR  . Transthoracic echocardiogram  06/03/2013    EF 25-30%, mod conc. hypertrophy, grade 3 diastolic dysfunction; LA mod dilated; calcified MV annulus; transaortic gradients are normal for the bioprosthetic valve; inf vena cava dilated (elevated CVP) - LifeVest  . Left and right heart catheterization with coronary angiogram N/A 06/05/2013    Procedure: LEFT AND RIGHT HEART CATHETERIZATION WITH CORONARY ANGIOGRAM;  Surgeon: DaLeonie ManMD;  [facility];  Service: Cardiovascular;  Laterality: N/A;  . Graft(s) angiogram  06/05/2013    Procedure: GRAFT(S) ANCyril Loosen Surgeon: DaLeonie ManMD;  [facility];  Service: Cardiovascular;;  . Colonoscopy  2007    Dr. MaAviva Signsinternal hemorrhoids    There were no vitals filed for this visit.  Visit Diagnosis:  Open toe wound, subsequent encounter  Difficulty walking  Unsteadiness                 Wound Therapy - 09/08/15 0840    Subjective Pt states that he has no pain today.  He is going to his vascular surgeon this morning.     Patient and Family Stated Goals wound to heal    Pain Assessment No/denies pain   Wound Properties Date First Assessed:  08/28/15 Wound Type: Diabetic ulcer Location: Toe (Comment  which one) Location Orientation: Right Wound Description (Comments): Medial aspect of Rt great toe just supeior to larger wound  Present on Admission: Yes   Dressing Type Gauze (Comment);Silver hydrofiber  Vaseline perimeter, silver hydrofiber with gauze   Dressing Changed Changed   Dressing Status Old drainage   Dressing Change Frequency Every 3 days   Site / Wound Assessment Friable;Granulation tissue   % Wound base Red or Granulating 20%   % Wound base Yellow 80%   Margins Attached edges (approximated)   Drainage Amount Scant   Drainage Description Serosanguineous   Treatment Cleansed;Debridement (Selective)   Wound Properties Date  First Assessed: 06/23/15 Time First Assessed: 0900 Wound Type: Other (Comment) Location: Toe (Comment  which one) Location Orientation: Right Wound Description (Comments): Lateral aspect of Rt great toe Present on Admission: No   Dressing Type Gauze (Comment);Silver dressings  vaseline perimeter, silver hydrofiber, gauze   Dressing Changed Changed   Dressing Status Intact;Old drainage   Dressing Change Frequency Every 3 days   Site / Wound Assessment Friable;Granulation tissue;Other (Comment)   % Wound base Red or Granulating 20%   % Wound base Other (Comment) 80%  white; macerated edges   Peri-wound Assessment Erythema (blanchable)   Margins Unattached edges (unapproximated)   Drainage Amount Minimal   Drainage Description Serosanguineous   Treatment Cleansed;Debridement (Selective)   Incision Properties Date First Assessed: 04/23/15 Time First Assessed: 1738 Location: Groin Location Orientation: Left Present on Admission: No   Selective Debridement - Location wound bed and callous surrounding wound.    Selective Debridement - Tools Used Forceps;Scalpel   Selective Debridement - Tissue Removed slough and callous   Wound Therapy - Clinical Statement Pt in need of a dietician consult as he states he is not sure what he needs to eat  or stay away from. Theraist will attempt to contact [place].     Wound Therapy - Functional Problem List Non-healing wound  affecting gait.    Factors Delaying/Impairing Wound Healing Altered sensation;Diabetes Mellitus;Infection - systemic/local;Immobility;Multiple medical problems;Polypharmacy;Vascular compromise   Hydrotherapy Plan Debridement;Dressing change;Patient/family education   Wound Therapy - Frequency --  2x/ week   Wound Therapy - Current Recommendations PT   Wound Plan Assess redness next session with antibiotics, MD may want a culture   Dressing  vaseline to periphery of wound silver hydrofiber to wound bed followed by 2x2 and kling.     Decrease Necrotic Tissue to 20%   Decrease Necrotic Tissue - Progress Not progressing   Increase Granulation Tissue to 80%   Increase Granulation Tissue - Progress Not met                              Problem List Patient Active Problem List   Diagnosis Date Noted  . Rectal bleeding 07/28/2015  . Chronic anticoagulation 07/28/2015  . PVD (peripheral vascular disease) ([place])   . S/P angioplasty 04/23/2015  . Peripheral arterial occlusive disease ([place]) 04/23/2015  . PAD (peripheral artery disease) ([place])   . Critical lower limb ischemia   . Toe ulcer, right ([place])   . Type II or unspecified type diabetes mellitus without mention of complication, uncontrolled 11/27/2013  . Acute respiratory failure with hypoxia ([place]) 11/26/2013  . CAP (community acquired pneumonia) 11/26/2013  . CHF exacerbation ([place]) 11/25/2013  . Cardiomyopathy, ischemic 06/13/2013  . At risk for sudden cardiac death 2013/06/12  . S/P cardiac  catheterization, Rt & Lt heart cath 06/05/2013 06/07/2013  . Acute on chronic combined systolic and diastolic HF (heart failure), NYHA class 3 ([place]) 06/04/2013  . Atrial flutter with rapid ventricular response ([place]) 06/02/2013  . Acute renal failure: Cr up to 1.8 06/02/2013  . Hyperkalemia 06/02/2013    Class: Acute  . LBBB (left bundle branch block) 06/01/2013  . Hypotension- secondary to AF and Diltiazem Rx 06/01/2013  . Obesity (BMI 30-39.9) 06/01/2013  . Acute on chronic diastolic heart failure - due to Afib AVR 06/01/2013  . Atrial fibrillation with RVR- converted with Amiodarone 05/31/2013  . Stroke- Lt brain 2003 04/08/2013  . Hemiplegia affecting right dominant side ([place]) 04/08/2013  . Cataract 04/08/2013  . GERD (gastroesophageal reflux disease) 04/08/2013  . S/P CABG x 3 - 2010 04/08/2013  . S/P AVR-tissue, 2010 04/08/2013  . DM2 (diabetes mellitus, type 2) ([place]) 04/08/2013  . Dyslipidemia 04/08/2013  . SUBACROMIAL BURSITIS, LEFT  11/05/2009   Rayetta Humphrey, PT CLT [phone] 09/08/2015, 8:49 AM  [place] [address] [place], [name], [address] Phone: [phone]   Fax:  [phone]  Name: [name] MRN: [id number] Date of Birth: 12/03/1945

## 2015-09-08 NOTE — Progress Notes (Signed)
Chief Complaint: Patient was seen in consultation today for  Chief Complaint  Patient presents with  . Follow-up    follow up Right SFA angioplasty/stent     at the request of Darus Hershman  Referring Physician(s): Hailley Byers  History of Present Illness: [name] is a 70 y.o. male with a history of Rutherford stage V critical limb ischemia with a slowly healing ulcer of the right great toe. He has extensive peripheral arterial disease and underwent recanalization of his occluded right superficial femoral artery on 04/23/2015.  He experienced a marked acceleration and wound healing following the procedure which has since plateaued. He has known below the knee disease with occlusion of anterior tibial and posterior tibial arteries but declined further treatment as he felt his wound was still continuing to heal.  He presents today for a six-week wound check.  While the anterior aspect of his right great toe has healed over, he still has a nickel sized arterial ulcer along the medial aspect of the great toe. This is not painful to palpation.  We again discussed the need to provide direct in-line flow to the toe in order to facilitate healing. He is about to move to [facility] and would like to have this taken care of prior to his move.   Past Medical History  Diagnosis Date  . Hypertension   . Diabetes mellitus     type 2   . History of valve replacement 2010    [facility] Ease bioprosthetic 37mm  . Thyroid disease   . Stroke [facility]) 2003    R-sided weakness & upper extremity swelling   . Coronary artery disease   . Dyslipidemia   . At risk for sudden cardiac death 06/29/2013  . PAF (paroxysmal atrial fibrillation) ([place]) 05/2013    converted with amiodarone to SR  . Chronic anticoagulation 05/2013    started  . S/P cardiac catheterization, Rt & Lt heart cath 06/05/2013 06/29/13  . S/P CABG x 3 2010  . GERD (gastroesophageal reflux disease)   .  Cataract   . Chronic kidney disease     Patient reports he is seeing Kidney doctor in September    Past Surgical History  Procedure Laterality Date  . Coronary artery bypass graft  01/26/2009    LIMA to LAD, SVG to circumflex, SVG to PDA  . Aortic valve replacement  01/26/2009    [facility] Ease bioprosthetic 66mm valve  . Transthoracic echocardiogram  09/2011    grade 1 diastolic dysfunction; increasing valve gradient; calcified MV annulus   . Cardiac catheterization  06/05/2013    native 3 vessel disease; patent LIMA to LAD, SVG to OM and SVG to PDA; Elevated right and left heart filling pressures, with predominantly pulmonary venous hypertension, normal PVR  . Transthoracic echocardiogram  06/03/2013    EF 25-30%, mod conc. hypertrophy, grade 3 diastolic dysfunction; LA mod dilated; calcified MV annulus; transaortic gradients are normal for the bioprosthetic valve; inf vena cava dilated (elevated CVP) - LifeVest  . Left and right heart catheterization with coronary angiogram N/A 06/05/2013    Procedure: LEFT AND RIGHT HEART CATHETERIZATION WITH CORONARY ANGIOGRAM;  Surgeon: Leonie Man, MD;  Location: [facility] CATH LAB;  Service: Cardiovascular;  Laterality: N/A;  . Graft(s) angiogram  06/05/2013    Procedure: GRAFT(S) Cyril Loosen;  Surgeon: Leonie Man, MD;  Location: [facility] CATH LAB;  Service: Cardiovascular;;  . Colonoscopy  2007    Dr. Aviva Signs: internal hemorrhoids  Allergies: Review of patient's allergies indicates no known allergies.  Medications: Prior to Admission medications   Medication Sig Start Date End Date Taking? Authorizing Provider  acetaminophen (TYLENOL) 500 MG tablet Take 1,000 mg by mouth every 6 (six) hours as needed. For pain/headaches     Historical Provider, MD  ALPRAZolam Duanne Moron) 1 MG tablet Take 1 mg by mouth 4 (four) times daily as needed for anxiety.     Historical Provider, MD  amiodarone (PACERONE) 200 MG tablet TAKE ONE (1) TABLET EACH DAY  07/15/15   Pixie Casino, MD  apixaban (ELIQUIS) 5 MG TABS tablet Take 1 tablet (5 mg total) by mouth 2 (two) times daily. 04/15/15   Pixie Casino, MD  atorvastatin (LIPITOR) 20 MG tablet Take 1 tablet (20 mg total) by mouth daily at 6 PM. 10/30/14   Pixie Casino, MD  celecoxib (CELEBREX) 200 MG capsule Take 1 capsule by mouth 2 (two) times daily. 04/07/14   Historical Provider, MD  clopidogrel (PLAVIX) 75 MG tablet Take 1 tablet (75 mg total) by mouth daily. 04/25/15   Hedy Jacob, PA-C  docusate sodium (COLACE) 100 MG capsule Take 100 mg by mouth 2 (two) times daily.      Historical Provider, MD  doxycycline (VIBRA-TABS) 100 MG tablet Take 100 mg by mouth 2 (two) times daily. Reported on 08/25/2015    Historical Provider, MD  fenofibrate (TRICOR) 145 MG tablet Take 145 mg by mouth daily.    Historical Provider, MD  ferrous sulfate 325 (65 FE) MG EC tablet Take 325 mg by mouth every morning.    Historical Provider, MD  gabapentin (NEURONTIN) 300 MG capsule Take 300 mg by mouth 2 (two) times daily.     Historical Provider, MD  HYDROcodone-acetaminophen (NORCO) 10-325 MG per tablet Take 1 tablet by mouth as needed for severe pain.  03/31/14   Historical Provider, MD  hydrocortisone (PROCTOSOL HC) 2.5 % rectal cream Place 1 application rectally 2 (two) times daily. For 10 days 07/28/15   Mahala Menghini, PA-C  insulin glargine (LANTUS) 100 UNIT/ML injection Inject 60-75 Units into the skin 2 (two) times daily. Takes 75 units in the morning and 60 units in the evening    Historical Provider, MD  insulin lispro (HUMALOG) 100 UNIT/ML injection Inject 35-50 Units into the skin 3 (three) times daily before meals.     Historical Provider, MD  levothyroxine (SYNTHROID, LEVOTHROID) 25 MCG tablet Take 25 mcg by mouth daily.      Historical Provider, MD  metoprolol succinate (TOPROL-XL) 25 MG 24 hr tablet Take 1 tablet (25 mg total) by mouth daily. 10/30/14   Pixie Casino, MD  Omega-3 Fatty Acids (FISH  OIL PO) Take 1 capsule by mouth 2 (two) times daily.     Historical Provider, MD  pantoprazole (PROTONIX) 40 MG tablet Take 1 tablet (40 mg total) by mouth 2 (two) times daily. 10/30/14   Pixie Casino, MD  polyethylene glycol-electrolytes (NULYTELY/GOLYTELY) 420 G solution Take 4,000 mLs by mouth once. 08/05/15   Mahala Menghini, PA-C  silver sulfADIAZINE (SILVADENE) 1 % cream Apply 1 application topically 4 (four) times daily.    Historical Provider, MD  torsemide (DEMADEX) 10 MG tablet Take 1 tablet (10 mg total) by mouth daily. 06/29/15   Pixie Casino, MD     Family History  Problem Relation Age of Onset  . Heart attack Mother   . Liver disease Brother   . Diabetes Sister  x4    Social History   Social History  . Marital Status: Divorced    Spouse Name: N/A  . Number of Children: 4  . Years of Education: N/A   Occupational History  . 4    Social History Main Topics  . Smoking status: Former Smoker    Types: Cigarettes    Quit date: 08/15/2001  . Smokeless tobacco: Former Systems developer    Types: Chew     Comment: Quit in 2003  . Alcohol Use: No  . Drug Use: No  . Sexual Activity: Not Currently   Other Topics Concern  . Not on file   Social History Narrative   Review of Systems: A 12 point ROS discussed and pertinent positives are indicated in the HPI above.  All other systems are negative.  Review of Systems  Vital Signs: BP 167/90 mmHg  Pulse 66  Temp(Src) 97.4 F (36.3 C) (Oral)  Resp 12  SpO2 97%  Physical Exam  Constitutional: He is oriented to person, place, and time. He appears well-developed and well-nourished. No distress.  HENT:  Head: Normocephalic and atraumatic.  Eyes: No scleral icterus.  Cardiovascular: Normal rate.   Pulmonary/Chest: Effort normal.  Abdominal: Soft.  Feet:  Right Foot:  Skin Integrity: Positive for ulcer.  Neurological: He is alert and oriented to person, place, and time.  Psychiatric: He has a normal mood and affect.    Nursing note and vitals reviewed.    Imaging: No results found.  Labs:  CBC:  Recent Labs  04/23/15 1000  WBC 7.0  HGB 13.8  HCT 41.5  PLT 160    COAGS:  Recent Labs  04/23/15 1000  INR 1.07    BMP:  Recent Labs  03/18/15 0831 04/23/15 1000 06/29/15 1010  NA  --  138 141  K  --  4.7 4.3  CL  --  103 100  CO2  --  26 28  GLUCOSE  --  277* 192*  BUN 22 21* 23  CALCIUM  --  9.6 9.7  CREATININE 1.35* 1.35* 1.38*  GFRNONAA 54* 53*  --   GFRAA 63 >60  --     LIVER FUNCTION TESTS:  Recent Labs  06/29/15 1010  BILITOT 0.4  AST 32  ALT 50*  ALKPHOS 50  PROT 6.6  ALBUMIN 4.2    Assessment and Plan:  Persistent slowly healing ulcer along the medial aspect of the right great toe.  Rutherford stage V critical limb ischemia.  1.) Schedule at [facility] for RLE angiogram and revascularization of the PT and AT with orbital atherectomy and PTA.  May require arterial access both at the groin, and the right foot.   Electronically Signed: Laurence Ferrari, [place] 09/08/2015, 2:00 PM   I spent a total of 15 Minutes in face to face in clinical consultation, greater than 50% of which was counseling/coordinating care for critical limb ischemia.

## 2015-09-08 NOTE — Telephone Encounter (Signed)
Called pt's niece, left VM for her to call to schedule procedure with Laurence Ferrari. JM

## 2015-09-09 ENCOUNTER — Telehealth (HOSPITAL_COMMUNITY): Payer: Self-pay | Admitting: Interventional Radiology

## 2015-09-09 NOTE — Telephone Encounter (Signed)
Called pt's niece again, left another VM for her to call to schedule pt's appt with Dr. Laurence Ferrari. JM

## 2015-09-11 ENCOUNTER — Ambulatory Visit (HOSPITAL_COMMUNITY): Payer: Medicare Other | Admitting: Physical Therapy

## 2015-09-11 DIAGNOSIS — S91109D Unspecified open wound of unspecified toe(s) without damage to nail, subsequent encounter: Secondary | ICD-10-CM | POA: Diagnosis not present

## 2015-09-11 DIAGNOSIS — R262 Difficulty in walking, not elsewhere classified: Secondary | ICD-10-CM

## 2015-09-11 DIAGNOSIS — R2681 Unsteadiness on feet: Secondary | ICD-10-CM | POA: Diagnosis not present

## 2015-09-11 NOTE — Therapy (Signed)
[name], [name], [address] Phone: [phone]   Fax:  [phone]  Wound Care Therapy  Patient Details  Name: [name] MRN: [id number] Date of Birth: March 20, 1946 No Data Recorded  Encounter Date: 09/11/2015      PT End of Session - 09/11/15 0844    Visit Number 39   Number of Visits 50   Date for PT Re-Evaluation 09/27/15   Authorization Type [employer] Medicare: Gcode and reassessment complete visit 14   Authorization - Visit Number 49   Authorization - Number of Visits 36   PT Start Time 0800   PT Stop Time 0838   PT Time Calculation (min) 38 min   Activity Tolerance Patient tolerated treatment well      Past Medical History  Diagnosis Date  . Hypertension   . Diabetes mellitus     type 2   . History of valve replacement 2010    [facility] Ease bioprosthetic 56m  . Thyroid disease   . Stroke ([facility] 2003    R-sided weakness & upper extremity swelling   . Coronary artery disease   . Dyslipidemia   . At risk for sudden cardiac death 111-21-2014 . [place]    converted with amiodarone to SR  . Chronic anticoagulation 05/2013    started  . S/P cardiac catheterization, Rt & Lt heart cath 06/05/2013 111/21/2014 . S/P CABG x 3 2010  . GERD (gastroesophageal reflux disease)   . Cataract   . Chronic kidney disease     Patient reports he is seeing Kidney doctor in September    Past Surgical History  Procedure Laterality Date  . Coronary artery bypass graft  01/26/2009    LIMA to LAD, SVG to circumflex, SVG to PDA  . [facility]ortic valve [facility]  [facility]26/2009    [facility]  . Transthoracic echocardiogram  09/2011    grade 1 diastolic dysfunction; increasing valve gradient; calcified MV annulus   . Cardiac catheterization  06/05/2013    native 3 vessel disease; patent LIMA to LAD, SVG to OM and SVG to PDA; Elevated right and  left heart filling pressures, with predominantly pulmonary venous hypertension, normal PVR  . Transthoracic echocardiogram  06/03/2013    EF 25-30%, mod conc. hypertrophy, grade 3 diastolic dysfunction; Rickey mod dilated; calcified MV annulus; transaortic gradients are normal for the bioprosthetic valve; inf vena cava dilated (elevated CVP) - LifeVest  . Left and right heart catheterization with coronary angiogram N/A 06/05/2013    Procedure: LEFT AND RIGHT HEART CATHETERIZATION WITH CORONARY ANGIOGRAM;  Surgeon: [name];  [facility];  Service: Cardiovascular;  Laterality: N/A;  . Graft(s) angiogram  06/05/2013    Procedure: GRAFT(S) ANCyril Loosen Surgeon: [name];  [facility];  Service: Cardiovascular;;  . Colonoscopy  2007    Dr. MaAviva Signsinternal hemorrhoids    There were no vitals filed for this visit.  Visit Diagnosis:  Open toe wound, subsequent encounter  Difficulty walking  Unsteadiness                 Wound Therapy - 09/11/15 0835    Subjective Pt states that he saw the vasuclar surgeon and will need stents placed in his legs next week.    Patient and Family Stated Goals wound to heal    Pain Assessment 0-10   Pain Score 7  states that pain has increased ever since his MD visit.    Pain Type Acute pain   Pain Location Toe (Comment which one)  great   Pain Orientation Right   Pain Descriptors / Indicators Aching;Throbbing   Pain Onset With Activity   Patients Stated Pain Goal 0   Pain Intervention(s) Emotional support   Multiple Pain Sites No   Wound Properties Date First Assessed: 08/28/15 Wound Type: Diabetic ulcer Location: Toe (Comment  which one) Location Orientation: Right Wound Description (Comments): Medial aspect of Rt great toe just supeior to larger wound  Present on Admission: Yes Final Assessment Date: 09/11/15   Dressing Type --  Vaseline perimeter, silver hydrofiber with gauze   Dressing Changed --    Dressing Status --   Dressing Change Frequency --   Site / Wound Assessment --   % Wound base Red or Granulating --   % Wound base Yellow --   Wound Length (cm) --   Wound Width (cm) --   Wound Depth (cm) --   Margins --   Drainage Amount --   Drainage Description --   Treatment --   Wound Properties Date First Assessed: 06/23/15 Time First Assessed: 0900 Wound Type: Other (Comment) Location: Toe (Comment  which one) Location Orientation: Right Wound Description (Comments): medial  aspect of Rt great toe Present on Admission: No   Dressing Type Gauze (Comment);Silver hydrofiber  vaseline perimeter, silver hydrofiber, gauze   Dressing Changed Changed   Dressing Status Intact;Old drainage   Dressing Change Frequency Every 3 days   Site / Wound Assessment Friable;Granulation tissue;Other (Comment)   % Wound base Red or Granulating 15%   % Wound base Yellow 85%   % Wound base Other (Comment) 80%  white; macerated edges   Peri-wound Assessment Erythema (blanchable)   Wound Length (cm) 1.5 cm   Wound Width (cm) 1.5 cm   Wound Depth (cm) 0.4 cm   Margins Unattached edges (unapproximated)   Drainage Amount Minimal   Drainage Description Serosanguineous   Treatment Cleansed;Debridement (Selective)   Incision Properties Date First Assessed: 04/23/15 Time First Assessed: 1738 Location: Groin Location Orientation: Left Present on Admission: No   Selective Debridement - Location wound bed and callous surrounding wound.    Selective Debridement - Tools Used Forceps;Scalpel;Scissors   Selective Debridement - Tissue Removed slough and callous   Wound Therapy - Clinical Statement Pt states MD office has contacted him re: nutritional consult.  Pt wounds have merged.  Pt to have new stents placed in LE on 09/18/2015.   Wound Therapy - Functional Problem List Non-healing wound  affecting gait.    Factors Delaying/Impairing Wound Healing Altered sensation;Diabetes Mellitus;Infection -  systemic/local;Immobility;Multiple medical problems;Polypharmacy;Vascular compromise   Hydrotherapy Plan Debridement;Dressing change;Patient/family education   Wound Therapy - Frequency --  2x/ week   Wound Therapy - Current Recommendations PT   Wound Plan Assess redness next session with antibiotics, MD may want a culture   Dressing  vaseline to periphery of wound silver hydrofiber to wound bed followed by 2x2 and kling.    Decrease Necrotic Tissue to 20%   Decrease Necrotic Tissue - Progress Not met   Increase Granulation Tissue to 80%   Increase Granulation Tissue - Progress Not met                              Problem List Patient Active Problem List   Diagnosis Date Noted  .  Rectal bleeding 07/28/2015  . Chronic anticoagulation 07/28/2015  . PVD (peripheral vascular disease) ([place])   . S/P angioplasty 04/23/2015  . Peripheral arterial occlusive disease ([place]) 04/23/2015  . PAD (peripheral artery disease) ([place])   . Critical lower limb ischemia   . Toe ulcer, right ([place])   . Type II or unspecified type diabetes mellitus without mention of complication, uncontrolled 11/27/2013  . Acute respiratory failure with hypoxia ([place]) 11/26/2013  . CAP (community acquired pneumonia) 11/26/2013  . CHF exacerbation ([place]) 11/25/2013  . Cardiomyopathy, ischemic 06/13/2013  . At risk for sudden cardiac death 01-Jul-2013  . S/P cardiac catheterization, Rt & Lt heart cath 06/05/2013 07-01-2013  . Acute on chronic combined systolic and diastolic HF (heart failure), NYHA class 3 ([place]) 06/04/2013  . Atrial flutter with rapid ventricular response ([place]) 06/02/2013  . Acute renal failure: Cr up to 1.8 06/02/2013  . Hyperkalemia 06/02/2013    Class: Acute  . LBBB (left bundle branch block) 06/01/2013  . Hypotension- secondary to AF and Diltiazem Rx 06/01/2013  . Obesity (BMI 30-39.9) 06/01/2013  . Acute on chronic diastolic heart failure - due to Afib AVR 06/01/2013  . Atrial  fibrillation with RVR- converted with Amiodarone 05/31/2013  . Stroke- Lt brain 2003 04/08/2013  . Hemiplegia affecting right dominant side ([place]) 04/08/2013  . Cataract 04/08/2013  . GERD (gastroesophageal reflux disease) 04/08/2013  . S/P CABG x 3 - 2010 04/08/2013  . S/P AVR-tissue, 2010 04/08/2013  . DM2 (diabetes mellitus, type 2) ([place]) 04/08/2013  . Dyslipidemia 04/08/2013  . SUBACROMIAL BURSITIS, LEFT 11/05/2009    Rayetta Humphrey, PT CLT [phone] 09/11/2015, 8:45 AM  [place], [name], [address] Phone: [phone]   Fax:  [phone]  Name: JEWELZ KOBUS MRN: [id number] Date of Birth: [birth year]

## 2015-09-14 ENCOUNTER — Telehealth: Payer: Self-pay | Admitting: Internal Medicine

## 2015-09-14 NOTE — Telephone Encounter (Signed)
New message    Pt c/o medication issue:  1. Name of Medication: does not know which medication that could be causing this.   2. How are you currently taking this medication (dosage and times per day)? Unsure   3. Are you having a reaction (difficulty breathing--STAT)?  Rectum bleeding   4. What is your medication issue? Wants to discuss with nurse.

## 2015-09-14 NOTE — Telephone Encounter (Signed)
Returned call. Pt describes blood when wiping w/ toilet tissue over weekend.  Pt apparently started on a 3rd blood thinner by reg physician - "starts with a C". I am trying to clarify if this is clopidogrel, because he has been on this previously. May have been confusion and he only had med renewed.  Pt to clarify and call back. Advised no need to change meds for now esp if bleeding resolved. Call if worse.

## 2015-09-15 ENCOUNTER — Ambulatory Visit (HOSPITAL_COMMUNITY): Payer: Medicare Other | Admitting: Physical Therapy

## 2015-09-15 DIAGNOSIS — S91109D Unspecified open wound of unspecified toe(s) without damage to nail, subsequent encounter: Secondary | ICD-10-CM | POA: Diagnosis not present

## 2015-09-15 DIAGNOSIS — R2681 Unsteadiness on feet: Secondary | ICD-10-CM

## 2015-09-15 DIAGNOSIS — R262 Difficulty in walking, not elsewhere classified: Secondary | ICD-10-CM | POA: Diagnosis not present

## 2015-09-15 NOTE — Therapy (Signed)
[place], [name], [address] Phone: [phone]   Fax:  [phone]  Wound Care Therapy  Patient Details  Name: [name] MRN: [id number] Date of Birth: Apr 25, 1946 No Data Recorded  Encounter Date: 09/15/2015      PT End of Session - 09/15/15 1010    Visit Number 40   Number of Visits 45   Date for PT Re-Evaluation 09/27/15   Authorization Type UHC Medicare: Gcode and reassessment complete visit 72   Authorization - Visit Number 29   Authorization - Number of Visits 47   PT Start Time 0800   PT Stop Time 0830   PT Time Calculation (min) 30 min   Activity Tolerance Patient tolerated treatment well   Behavior During Therapy [facility] for tasks assessed/performed      Past Medical History  Diagnosis Date  . Hypertension   . Diabetes mellitus     type 2   . History of valve replacement 2010    [facility] Ease bioprosthetic 30mm  . Thyroid disease   . Stroke [facility]) 2003    R-sided weakness & upper extremity swelling   . Coronary artery disease   . Dyslipidemia   . At risk for sudden cardiac death 06-08-2013  . PAF (paroxysmal atrial fibrillation) ([place]) 05/2013    converted with amiodarone to SR  . Chronic anticoagulation 05/2013    started  . S/P cardiac catheterization, Rt & Lt heart cath 06/05/2013 2013-06-08  . S/P CABG x 3 2010  . GERD (gastroesophageal reflux disease)   . Cataract   . Chronic kidney disease     Patient reports he is seeing Kidney doctor in September    Past Surgical History  Procedure Laterality Date  . Coronary artery bypass graft  01/26/2009    LIMA to LAD, SVG to circumflex, SVG to PDA  . Aortic valve replacement  01/26/2009    [facility] Ease bioprosthetic 30mm valve  . Transthoracic echocardiogram  09/2011    grade 1 diastolic dysfunction; increasing valve gradient; calcified MV annulus   . Cardiac catheterization  06/05/2013    native 3 vessel disease; patent  LIMA to LAD, SVG to OM and SVG to PDA; Elevated right and left heart filling pressures, with predominantly pulmonary venous hypertension, normal PVR  . Transthoracic echocardiogram  06/03/2013    EF 25-30%, mod conc. hypertrophy, grade 3 diastolic dysfunction; LA mod dilated; calcified MV annulus; transaortic gradients are normal for the bioprosthetic valve; inf vena cava dilated (elevated CVP) - LifeVest  . Left and right heart catheterization with coronary angiogram N/A 06/05/2013    Procedure: LEFT AND RIGHT HEART CATHETERIZATION WITH CORONARY ANGIOGRAM;  Surgeon: Leonie Man, MD;  Location: [facility] CATH LAB;  Service: Cardiovascular;  Laterality: N/A;  . Graft(s) angiogram  06/05/2013    Procedure: GRAFT(S) Cyril Loosen;  Surgeon: Leonie Man, MD;  Location: [facility] CATH LAB;  Service: Cardiovascular;;  . Colonoscopy  2007    Dr. Aviva Signs: internal hemorrhoids    There were no vitals filed for this visit.  Visit Diagnosis:  Open toe wound, subsequent encounter  Difficulty walking  Unsteadiness                 Wound Therapy - 09/15/15 0959    Subjective Pt states he is getting his stents on Friday.  States he is moving to [place] as soon as his wound heals.   Patient and Family Stated Goals wound to  heal    Pain Assessment No/denies pain   Wound Properties Date First Assessed: 06/23/15 Time First Assessed: 0900 Wound Type: Other (Comment) Location: Toe (Comment  which one) Location Orientation: Right Wound Description (Comments): medial  aspect of Rt great toe Present on Admission: No   Dressing Type Gauze (Comment);Silver hydrofiber  vaseline perimeter, silver hydrofiber, gauze   Dressing Changed Changed   Dressing Status Intact;Old drainage   Dressing Change Frequency Every 3 days   Site / Wound Assessment Friable;Granulation tissue;Other (Comment)   % Wound base Red or Granulating 15%   % Wound base Yellow 85%   % Wound base Other (Comment) --  white; macerated edges    Peri-wound Assessment Erythema (blanchable)   Margins Unattached edges (unapproximated)   Drainage Amount Minimal   Drainage Description Serosanguineous   Treatment Cleansed;Debridement (Selective)   Incision Properties Date First Assessed: 04/23/15 Time First Assessed: 1738 Location: Groin Location Orientation: Left Present on Admission: No   Selective Debridement - Location wound bed and callous surrounding wound.    Selective Debridement - Tools Used Forceps;Scalpel;Scissors   Selective Debridement - Tissue Removed slough and callous   Wound Therapy - Clinical Statement Little improvment in wound on lateral side of big toe. Removed more dry skin from perimeter and slough from inside wound borders, however unknow depth at this time.  No redness today so dont believe patient needs an antibiotic or culture.  May benefit from changing dressing to medihoney to increase removal of slough   Wound Therapy - Functional Problem List Non-healing wound  affecting gait.    Factors Delaying/Impairing Wound Healing Altered sensation;Diabetes Mellitus;Infection - systemic/local;Immobility;Multiple medical problems;Polypharmacy;Vascular compromise   Hydrotherapy Plan Debridement;Dressing change;Patient/family education   Wound Therapy - Frequency --  2x/ week   Wound Therapy - Current Recommendations PT   Wound Plan Continue woundcare including debridement.  May change to medihoney if slough continues to be adherent.   Dressing  vaseline to periphery of wound silver hydrofiber to wound bed followed by 2x2 and kling.    Decrease Necrotic Tissue to 20%   Increase Granulation Tissue to 80%                              Problem List Patient Active Problem List   Diagnosis Date Noted  . Rectal bleeding 07/28/2015  . Chronic anticoagulation 07/28/2015  . PVD (peripheral vascular disease) ([place])   . S/P angioplasty 04/23/2015  . Peripheral arterial occlusive disease ([place]) 04/23/2015   . PAD (peripheral artery disease) ([place])   . Critical lower limb ischemia   . Toe ulcer, right ([place])   . Type II or unspecified type diabetes mellitus without mention of complication, uncontrolled 11/27/2013  . Acute respiratory failure with hypoxia ([place]) 11/26/2013  . CAP (community acquired pneumonia) 11/26/2013  . CHF exacerbation ([place]) 11/25/2013  . Cardiomyopathy, ischemic 06/13/2013  . At risk for sudden cardiac death June 09, 2013  . S/P cardiac catheterization, Rt & Lt heart cath 06/05/2013 June 09, 2013  . Acute on chronic combined systolic and diastolic HF (heart failure), NYHA class 3 ([place]) 06/04/2013  . Atrial flutter with rapid ventricular response ([place]) 06/02/2013  . Acute renal failure: Cr up to 1.8 06/02/2013  . Hyperkalemia 06/02/2013    Class: Acute  . LBBB (left bundle branch block) 06/01/2013  . Hypotension- secondary to AF and Diltiazem Rx 06/01/2013  . Obesity (BMI 30-39.9) 06/01/2013  . Acute on chronic diastolic heart failure - due  to Afib AVR 06/01/2013  . Atrial fibrillation with RVR- converted with Amiodarone 05/31/2013  . Stroke- Lt brain 2003 04/08/2013  . Hemiplegia affecting right dominant side ([place]) 04/08/2013  . Cataract 04/08/2013  . GERD (gastroesophageal reflux disease) 04/08/2013  . S/P CABG x 3 - 2010 04/08/2013  . S/P AVR-tissue, 2010 04/08/2013  . DM2 (diabetes mellitus, type 2) ([place]) 04/08/2013  . Dyslipidemia 04/08/2013  . SUBACROMIAL BURSITIS, LEFT 11/05/2009    Teena Irani, PTA/CLT [phone]  09/15/2015, 10:17 AM  [place] [address] [place], [name], [address] Phone: [phone]   Fax:  [phone]  Name: MACKLYN PARPART MRN: [id number] Date of Birth: [birth year]

## 2015-09-17 ENCOUNTER — Other Ambulatory Visit: Payer: Self-pay | Admitting: Radiology

## 2015-09-17 ENCOUNTER — Other Ambulatory Visit: Payer: Self-pay | Admitting: Physician Assistant

## 2015-09-17 ENCOUNTER — Ambulatory Visit (HOSPITAL_COMMUNITY): Payer: Medicare Other | Attending: Orthopaedic Surgery | Admitting: Physical Therapy

## 2015-09-17 DIAGNOSIS — R2681 Unsteadiness on feet: Secondary | ICD-10-CM | POA: Insufficient documentation

## 2015-09-17 DIAGNOSIS — S91109D Unspecified open wound of unspecified toe(s) without damage to nail, subsequent encounter: Secondary | ICD-10-CM | POA: Diagnosis not present

## 2015-09-17 DIAGNOSIS — R262 Difficulty in walking, not elsewhere classified: Secondary | ICD-10-CM

## 2015-09-17 NOTE — Therapy (Signed)
[place], [name], [address] Phone: [phone]   Fax:  [phone]  Wound Care Therapy  Patient Details  Name: [name] MRN: [id number] Date of Birth: October 09, 1945 No Data Recorded  Encounter Date: 09/17/2015      PT End of Session - 09/17/15 1323    Visit Number 41   Number of Visits 45   Date for PT Re-Evaluation 09/27/15   Authorization Type UHC Medicare: Gcode and reassessment complete visit 37   Authorization - Visit Number 59   Authorization - Number of Visits 45   PT Start Time [id number]   PT Stop Time 1225   PT Time Calculation (min) 30 min   Activity Tolerance Patient tolerated treatment well   Behavior During Therapy [facility] for tasks assessed/performed      Past Medical History  Diagnosis Date  . Hypertension   . Diabetes mellitus     type 2   . History of valve replacement 2010    [facility] Ease bioprosthetic 35mm  . Thyroid disease   . Stroke [facility]) 2003    R-sided weakness & upper extremity swelling   . Coronary artery disease   . Dyslipidemia   . At risk for sudden cardiac death June 26, 2013  . PAF (paroxysmal atrial fibrillation) ([place]) 05/2013    converted with amiodarone to SR  . Chronic anticoagulation 05/2013    started  . S/P cardiac catheterization, Rt & Lt heart cath 06/05/2013 2013/06/26  . S/P CABG x 3 2010  . GERD (gastroesophageal reflux disease)   . Cataract   . Chronic kidney disease     Patient reports he is seeing Kidney doctor in September    Past Surgical History  Procedure Laterality Date  . Coronary artery bypass graft  01/26/2009    LIMA to LAD, SVG to circumflex, SVG to PDA  . Aortic valve replacement  01/26/2009    [facility] Ease bioprosthetic 41mm valve  . Transthoracic echocardiogram  09/2011    grade 1 diastolic dysfunction; increasing valve gradient; calcified MV annulus   . Cardiac catheterization  06/05/2013    native 3 vessel disease; patent  LIMA to LAD, SVG to OM and SVG to PDA; Elevated right and left heart filling pressures, with predominantly pulmonary venous hypertension, normal PVR  . Transthoracic echocardiogram  06/03/2013    EF 25-30%, mod conc. hypertrophy, grade 3 diastolic dysfunction; LA mod dilated; calcified MV annulus; transaortic gradients are normal for the bioprosthetic valve; inf vena cava dilated (elevated CVP) - LifeVest  . Left and right heart catheterization with coronary angiogram N/A 06/05/2013    Procedure: LEFT AND RIGHT HEART CATHETERIZATION WITH CORONARY ANGIOGRAM;  Surgeon: Leonie Man, MD;  Location: [facility] CATH LAB;  Service: Cardiovascular;  Laterality: N/A;  . Graft(s) angiogram  06/05/2013    Procedure: GRAFT(S) Cyril Loosen;  Surgeon: Leonie Man, MD;  Location: [facility] CATH LAB;  Service: Cardiovascular;;  . Colonoscopy  2007    Dr. Aviva Signs: internal hemorrhoids    There were no vitals filed for this visit.  Visit Diagnosis:  Open toe wound, subsequent encounter  Difficulty walking  Unsteadiness                 Wound Therapy - 09/17/15 1311    Subjective Pt states he goes tomorrow to get his stents in his [place].  States he has no pain   Patient and Family Stated Goals wound to heal  Pain Assessment No/denies pain   Wound Properties Date First Assessed: 06/23/15 Time First Assessed: 0900 Wound Type: Other (Comment) Location: Toe (Comment  which one) Location Orientation: Right Wound Description (Comments): medial  aspect of Rt great toe Present on Admission: No   Dressing Type Gauze (Comment);Silver hydrofiber  vaseline perimeter, silver hydrofiber, gauze   Dressing Changed Changed   Dressing Status Intact;Old drainage   Dressing Change Frequency Every 3 days   Site / Wound Assessment Friable;Granulation tissue;Other (Comment)   % Wound base Red or Granulating 15%   % Wound base Yellow 85%   % Wound base Other (Comment) --  white; macerated edges   Peri-wound Assessment  Erythema (blanchable)   Margins Unattached edges (unapproximated)   Drainage Amount Minimal   Drainage Description Serosanguineous   Treatment Cleansed;Debridement (Selective)   Incision Properties Date First Assessed: 04/23/15 Time First Assessed: 1738 Location: Groin Location Orientation: Left Present on Admission: No   Selective Debridement - Location wound bed and callous surrounding wound.    Selective Debridement - Tools Used Forceps;Scalpel;Scissors   Selective Debridement - Tissue Removed slough and callous   Wound Therapy - Clinical Statement Increased granulation around the peimeter of wound as well as improvement of skin integrity. No redness present today.  Continued with silver hydrofiber following debridment and irrigation.   Wounds improving.   Wound Therapy - Functional Problem List Non-healing wound  affecting gait.    Factors Delaying/Impairing Wound Healing Altered sensation;Diabetes Mellitus;Infection - systemic/local;Immobility;Multiple medical problems;Polypharmacy;Vascular compromise   Hydrotherapy Plan Debridement;Dressing change;Patient/family education   Wound Therapy - Frequency --  2x/ week   Wound Therapy - Current Recommendations PT   Wound Plan Continue woundcare with appropriate dressings.   Dressing  vaseline to periphery of wound silver hydrofiber to wound bed followed by 2x2 and kling.    Decrease Necrotic Tissue to 20%   Decrease Necrotic Tissue - Progress Progressing toward goal   Increase Granulation Tissue to 80%   Increase Granulation Tissue - Progress Progressing toward goal                              Problem List Patient Active Problem List   Diagnosis Date Noted  . Rectal bleeding 07/28/2015  . Chronic anticoagulation 07/28/2015  . PVD (peripheral vascular disease) ([place])   . S/P angioplasty 04/23/2015  . Peripheral arterial occlusive disease ([place]) 04/23/2015  . PAD (peripheral artery disease) ([place])   . Critical  lower limb ischemia   . Toe ulcer, right ([place])   . Type II or unspecified type diabetes mellitus without mention of complication, uncontrolled 11/27/2013  . Acute respiratory failure with hypoxia ([place]) 11/26/2013  . CAP (community acquired pneumonia) 11/26/2013  . CHF exacerbation ([place]) 11/25/2013  . Cardiomyopathy, ischemic 06/13/2013  . At risk for sudden cardiac death 2013-06-13  . S/P cardiac catheterization, Rt & Lt heart cath 06/05/2013 06-13-13  . Acute on chronic combined systolic and diastolic HF (heart failure), NYHA class 3 ([place]) 06/04/2013  . Atrial flutter with rapid ventricular response ([place]) 06/02/2013  . Acute renal failure: Cr up to 1.8 06/02/2013  . Hyperkalemia 06/02/2013    Class: Acute  . LBBB (left bundle branch block) 06/01/2013  . Hypotension- secondary to AF and Diltiazem Rx 06/01/2013  . Obesity (BMI 30-39.9) 06/01/2013  . Acute on chronic diastolic heart failure - due to Afib AVR 06/01/2013  . Atrial fibrillation with RVR- converted with Amiodarone 05/31/2013  . Stroke-  Lt brain 2003 04/08/2013  . Hemiplegia affecting right dominant side ([place]) 04/08/2013  . Cataract 04/08/2013  . GERD (gastroesophageal reflux disease) 04/08/2013  . S/P CABG x 3 - 2010 04/08/2013  . S/P AVR-tissue, 2010 04/08/2013  . DM2 (diabetes mellitus, type 2) ([place]) 04/08/2013  . Dyslipidemia 04/08/2013  . SUBACROMIAL BURSITIS, LEFT 11/05/2009    Teena Irani, PTA/CLT [phone]  09/17/2015, 1:32 PM  [place] [address] [place], [name], [address] Phone: [phone]   Fax:  [phone]  Name: [name] MRN: [id number] Date of Birth: 06-21-1946

## 2015-09-18 ENCOUNTER — Ambulatory Visit (HOSPITAL_COMMUNITY)
Admission: RE | Admit: 2015-09-18 | Discharge: 2015-09-18 | Disposition: A | Discharge status: Home / Self Care | Payer: Medicare Other | Source: Ambulatory Visit | Attending: Interventional Radiology | Admitting: Interventional Radiology

## 2015-09-18 DIAGNOSIS — L97519 Non-pressure chronic ulcer of other part of right foot with unspecified severity: Secondary | ICD-10-CM | POA: Insufficient documentation

## 2015-09-18 DIAGNOSIS — I998 Other disorder of circulatory system: Secondary | ICD-10-CM | POA: Insufficient documentation

## 2015-09-18 DIAGNOSIS — I70229 Atherosclerosis of native arteries of extremities with rest pain, unspecified extremity: Secondary | ICD-10-CM

## 2015-09-18 DIAGNOSIS — Z01812 Encounter for preprocedural laboratory examination: Secondary | ICD-10-CM | POA: Insufficient documentation

## 2015-09-18 DIAGNOSIS — Z5309 Procedure and treatment not carried out because of other contraindication: Secondary | ICD-10-CM | POA: Insufficient documentation

## 2015-09-18 LAB — BASIC METABOLIC PANEL
Anion gap: 11 (ref 5–15)
BUN: 16 mg/dL (ref 6–20)
CALCIUM: 9.4 mg/dL (ref 8.9–10.3)
CHLORIDE: 102 mmol/L (ref 101–111)
CO2: 26 mmol/L (ref 22–32)
CREATININE: 1.55 mg/dL — AB (ref 0.61–1.24)
GFR calc non Af Amer: 45 mL/min — ABNORMAL LOW (ref >60)
GFR, EST AFRICAN AMERICAN: 52 mL/min — AB (ref >60)
Glucose, Bld: 349 mg/dL — ABNORMAL HIGH (ref 65–99)
Potassium: 4.7 mmol/L (ref 3.5–5.1)
SODIUM: 139 mmol/L (ref 135–145)

## 2015-09-18 LAB — CBC
HCT: 41.6 % (ref 39.0–52.0)
Hemoglobin: 14.7 g/dL (ref 13.0–17.0)
MCH: 30.4 pg (ref 26.0–34.0)
MCHC: 35.3 g/dL (ref 30.0–36.0)
MCV: 86 fL (ref 78.0–100.0)
PLATELETS: 159 10*3/uL (ref 150–400)
RBC: 4.84 MIL/uL (ref 4.22–5.81)
RDW: 12.7 % (ref 11.5–15.5)
WBC: 6.4 10*3/uL (ref 4.0–10.5)

## 2015-09-18 LAB — PROTIME-INR
INR: 1.09 (ref 0.00–1.49)
PROTHROMBIN TIME: 14.3 s (ref 11.6–15.2)

## 2015-09-18 LAB — APTT: aPTT: 31 seconds (ref 24–37)

## 2015-09-18 LAB — GLUCOSE, CAPILLARY: Glucose-Capillary: 308 mg/dL — ABNORMAL HIGH (ref 65–99)

## 2015-09-18 MED ORDER — SODIUM CHLORIDE 0.9 % IV SOLN
INTRAVENOUS | Status: DC
  Administered 2015-09-18: 07:00:00 via INTRAVENOUS

## 2015-09-18 MED ORDER — CEFAZOLIN SODIUM-DEXTROSE 2-3 GM-% IV SOLR: 2.0000 g | Freq: Once | INTRAVENOUS | Status: DC

## 2015-09-18 NOTE — Progress Notes (Signed)
Case cancelled per physician; pr possibly took Eliquis last 24 hours; pt discharged home, instructed to hold x 48 hours prior to next appointment

## 2015-09-21 ENCOUNTER — Other Ambulatory Visit: Payer: Self-pay | Admitting: Licensed Clinical Social Worker

## 2015-09-21 ENCOUNTER — Ambulatory Visit: Payer: Self-pay | Admitting: Licensed Clinical Social Worker

## 2015-09-21 ENCOUNTER — Encounter: Payer: Self-pay | Admitting: Licensed Clinical Social Worker

## 2015-09-21 ENCOUNTER — Ambulatory Visit (HOSPITAL_COMMUNITY): Payer: Medicare Other | Admitting: Physical Therapy

## 2015-09-21 DIAGNOSIS — R2681 Unsteadiness on feet: Secondary | ICD-10-CM

## 2015-09-21 DIAGNOSIS — S91109D Unspecified open wound of unspecified toe(s) without damage to nail, subsequent encounter: Secondary | ICD-10-CM

## 2015-09-21 DIAGNOSIS — R262 Difficulty in walking, not elsewhere classified: Secondary | ICD-10-CM | POA: Diagnosis not present

## 2015-09-21 NOTE — Patient Outreach (Signed)
[facility]) Care Management  [facility] Social Work  09/21/2015  TAYLOR LEVICK 1946/08/09 [id number]  Subjective:    Objective:   Current Medications:  Current Outpatient Prescriptions  Medication Sig Dispense Refill  . acetaminophen (TYLENOL) 500 MG tablet Take 1,000 mg by mouth every 6 (six) hours as needed. For pain/headaches     . ALPRAZolam (XANAX) 1 MG tablet Take 1 mg by mouth 4 (four) times daily as needed for anxiety.     Marland Kitchen amiodarone (PACERONE) 200 MG tablet TAKE ONE (1) TABLET EACH DAY 120 tablet 2  . apixaban (ELIQUIS) 5 MG TABS tablet Take 1 tablet (5 mg total) by mouth 2 (two) times daily. 180 tablet 1  . atorvastatin (LIPITOR) 20 MG tablet Take 1 tablet (20 mg total) by mouth daily at 6 PM. 90 tablet 1  . celecoxib (CELEBREX) 200 MG capsule Take 1 capsule by mouth 2 (two) times daily.    . clopidogrel (PLAVIX) 75 MG tablet Take 1 tablet (75 mg total) by mouth daily. 30 tablet 3  . docusate sodium (COLACE) 100 MG capsule Take 100 mg by mouth 2 (two) times daily.      . fenofibrate (TRICOR) 145 MG tablet Take 145 mg by mouth daily.    Marland Kitchen gabapentin (NEURONTIN) 300 MG capsule Take 300 mg by mouth 2 (two) times daily.     Marland Kitchen HYDROcodone-acetaminophen (NORCO) 10-325 MG per tablet Take 1 tablet by mouth as needed for severe pain.     . hydrocortisone (PROCTOSOL HC) 2.5 % rectal cream Place 1 application rectally 2 (two) times daily. For 10 days (Patient taking differently: Place 1 application rectally daily as needed for hemorrhoids or itching. ) 30 g 0  . insulin glargine (LANTUS) 100 UNIT/ML injection Inject 60-75 Units into the skin 2 (two) times daily. Takes 75 units in the morning and 60 units in the evening    . insulin lispro (HUMALOG) 100 UNIT/ML injection Inject 35-50 Units into the skin 3 (three) times daily before meals.     . IRON PO Take 1 tablet by mouth daily.    Marland Kitchen levothyroxine (SYNTHROID, LEVOTHROID) 25 MCG tablet Take 25 mcg by mouth daily.       . metoprolol succinate (TOPROL-XL) 25 MG 24 hr tablet Take 1 tablet (25 mg total) by mouth daily. 90 tablet 1  . Omega-3 Fatty Acids (FISH OIL PO) Take 1 capsule by mouth 2 (two) times daily.     . pantoprazole (PROTONIX) 40 MG tablet Take 1 tablet (40 mg total) by mouth 2 (two) times daily. 180 tablet 1  . polyethylene glycol-electrolytes (NULYTELY/GOLYTELY) 420 G solution Take 4,000 mLs by mouth once. (Patient not taking: Reported on 09/09/2015) 4000 mL 0  . silver sulfADIAZINE (SILVADENE) 1 % cream Apply 1 application topically 2 (two) times daily.     Marland Kitchen torsemide (DEMADEX) 10 MG tablet Take 1 tablet (10 mg total) by mouth daily. 30 tablet 11   No current facility-administered medications for this visit.    Functional Status:  In your present state of health, do you have any difficulty performing the following activities: 09/18/2015 08/21/2015  Hearing? N N  Vision? N N  Difficulty concentrating or making decisions? N N  Walking or climbing stairs? Y -  Dressing or bathing? N N  Doing errands, shopping? - Scientist, forensic and eating ? - N  Using the Toilet? - N  In the past six months, have you accidently leaked urine? - N  Do you have problems with loss of bowel control? - N  Managing your Medications? - N  Managing your Finances? - N  Housekeeping or managing your Housekeeping? - Y    Fall/Depression Screening:  PHQ 2/9 Scores 08/25/2015 08/21/2015 07/30/2015 07/27/2015 06/29/2015 06/04/2015 04/29/2015  PHQ - 2 Score 0 0 0 0 0 1 1  PHQ- 9 Score - - - - - - -    Assessment:   CSW traveled to [place], [name] on 09/21/15 and met with client on 09/21/15 at home of client  Client said his medications cost more recently.  He said he has [employer].  Client said he has his prescribed medications and is taking medications as prescribed.  Client said he is eating well.  He said he is sleeping adequately.  He said he is thinking about moving back to [place], [name]. [place] is [place] where he was born.  He has relatives who live in [place], [name].  [place] provided client with [facility] education letter and gave client 3 Emmi products: "Fatigue"; "Preventing Falls;" and "How to Manage Stress." CSW also gave client [facility] non-discrimination information sheet.  Client was appreciative of Emmi materials received.  Client has some relatives nearby who visit him.  Client sees [facility] Gerarda Fraction at [facility]. He said he also sees Edythe Clarity, Librarian, academic at [facility].  Client said he has some financial struggles.  His vehicle runs well and he is driving vehicle to scheduled medical appointments.  CSW informed client about [facility] in [place] for food support assistance for client.. CSW talked with client about application process for food support through [facility].  Client said he goes 2 times weekly to local wound care center in [place].  He said he has a wound to dress on his right great toe.  He said that his visits at wound center in [place], [name] are helping and his right great toe is improving in appearance. He goes to see Dr. Debara Pickett, cardiolgoist, in [place], [name] every 6 months for medical appointment.  He said he gets a little short of breath walking to and from his mailbox.  He said he did not feel that he needed nurse support at present. Client and CSW spoke of client care plan. Client agreed that he would try to go to his scheduled medical appointments in next 30 days. CSW encouraged client to attend client scheduled  medical appointments in next 30 days.   He said he has lived at his current address for 5 years. He said he really wants to move in near back to [facility], [name] in the future if he is able to do so. Again, he has numerous relatives who live in [place], [place], [name]. [place] thanked client for home visit on 09/21/15. CSW gave client [facility] CSW card with CSW number on card ([phone]). CSW  encouraged  [place] to call CSW as needed for social work support.       Plan:  Client to attend scheduled client medical appointments in next 30 days. CSW to call client in 4 weeks to assess client needs at that time.  Norva Riffle.Nakaiya Beddow MSW, LCSW Licensed Clinical Social Worker [facility] Care Management [phone]

## 2015-09-21 NOTE — Therapy (Signed)
[place], [name], [address] Phone: [phone]   Fax:  [phone]  Wound Care Therapy  Patient Details  Name: [name] MRN: [id number] Date of Birth: [birth year] No Data Recorded  Encounter Date: 09/21/2015      PT End of Session - 09/21/15 1507    Visit Number 42   Number of Visits 45   Date for PT Re-Evaluation 09/27/15   Authorization Type UHC Medicare: Gcode and reassessment complete visit 70   Authorization - Visit Number 59   Authorization - Number of Visits 45   PT Start Time [id number]   PT Stop Time 1215   PT Time Calculation (min) 30 min   Activity Tolerance Patient tolerated treatment well   Behavior During Therapy [facility] for tasks assessed/performed      Past Medical History  Diagnosis Date  . Hypertension   . Diabetes mellitus     type 2   . History of valve replacement 2010    [facility] Ease bioprosthetic 38mm  . Thyroid disease   . Stroke [facility]) 2003    R-sided weakness & upper extremity swelling   . Coronary artery disease   . Dyslipidemia   . At risk for sudden cardiac death Jul 04, 2013  . PAF (paroxysmal atrial fibrillation) ([place]) 05/2013    converted with amiodarone to SR  . Chronic anticoagulation 05/2013    started  . S/P cardiac catheterization, Rt & Lt heart cath 06/05/2013 2013-07-04  . S/P CABG x 3 2010  . GERD (gastroesophageal reflux disease)   . Cataract   . Chronic kidney disease     Patient reports he is seeing Kidney doctor in September    Past Surgical History  Procedure Laterality Date  . Coronary artery bypass graft  01/26/2009    LIMA to LAD, SVG to circumflex, SVG to PDA  . Aortic valve replacement  01/26/2009    [facility] Ease bioprosthetic 24mm valve  . Transthoracic echocardiogram  09/2011    grade 1 diastolic dysfunction; increasing valve gradient; calcified MV annulus   . Cardiac catheterization  06/05/2013    native 3 vessel disease; patent  LIMA to LAD, SVG to OM and SVG to PDA; Elevated right and left heart filling pressures, with predominantly pulmonary venous hypertension, normal PVR  . Transthoracic echocardiogram  06/03/2013    EF 25-30%, mod conc. hypertrophy, grade 3 diastolic dysfunction; LA mod dilated; calcified MV annulus; transaortic gradients are normal for the bioprosthetic valve; inf vena cava dilated (elevated CVP) - LifeVest  . Left and right heart catheterization with coronary angiogram N/A 06/05/2013    Procedure: LEFT AND RIGHT HEART CATHETERIZATION WITH CORONARY ANGIOGRAM;  Surgeon: Leonie Man, MD;  Location: [facility] CATH LAB;  Service: Cardiovascular;  Laterality: N/A;  . Graft(s) angiogram  06/05/2013    Procedure: GRAFT(S) Cyril Loosen;  Surgeon: Leonie Man, MD;  Location: [facility] CATH LAB;  Service: Cardiovascular;;  . Colonoscopy  2007    Dr. Aviva Signs: internal hemorrhoids    There were no vitals filed for this visit.  Visit Diagnosis:  Open toe wound, subsequent encounter  Difficulty walking  Unsteadiness                 Wound Therapy - 09/21/15 1452    Subjective patient reports no  pain currently States he couldnt have his stents done due to taking the wrong blood thinner.   Pain Assessment 0-10   Pain Score 0-No pain  Wound Properties Date First Assessed: 06/23/15 Time First Assessed: 0900 Wound Type: Other (Comment) Location: Toe (Comment  which one) Location Orientation: Right Wound Description (Comments): medial  aspect of Rt great toe Present on Admission: No   Dressing Type Gauze (Comment)   Dressing Changed Changed   Dressing Status Intact;Old drainage   Dressing Change Frequency Every 3 days   Site / Wound Assessment Yellow;Granulation tissue;Pale   % Wound base Red or Granulating 15%   % Wound base Yellow 85%   Margins Unattached edges (unapproximated)   Drainage Amount Minimal   Drainage Description Serosanguineous   Treatment Cleansed;Debridement (Selective)    Incision Properties Date First Assessed: 04/23/15 Time First Assessed: 1738 Location: Groin Location Orientation: Left Present on Admission: No   Selective Debridement - Location wound bed and callous surrounding wound.    Selective Debridement - Tools Used Forceps;Scalpel;Scissors   Selective Debridement - Tissue Removed slough and callous   Wound Therapy - Clinical Statement Callous border perimeter of wound with continued slough in central woundbed.  Slough remains adherent and difficutlt to removed.  Changed dressing to medihoney gel in attempts to loosen slough.  Unsure of depth or amount of undermining at this point.     Wound Therapy - Functional Problem List Non-healing wound  affecting gait.    Factors Delaying/Impairing Wound Healing Altered sensation;Diabetes Mellitus;Infection - systemic/local;Immobility;Multiple medical problems;Polypharmacy;Vascular compromise   Hydrotherapy Plan Debridement;Dressing change;Patient/family education   Wound Plan Continue woundcare with appropriate dressings.   Dressing  medihoney gel gauze, 2cm conform                              Problem List Patient Active Problem List   Diagnosis Date Noted  . Rectal bleeding 07/28/2015  . Chronic anticoagulation 07/28/2015  . PVD (peripheral vascular disease) ([place])   . S/P angioplasty 04/23/2015  . Peripheral arterial occlusive disease ([place]) 04/23/2015  . PAD (peripheral artery disease) ([place])   . Critical lower limb ischemia   . Toe ulcer, right ([place])   . Type II or unspecified type diabetes mellitus without mention of complication, uncontrolled 11/27/2013  . Acute respiratory failure with hypoxia ([place]) 11/26/2013  . CAP (community acquired pneumonia) 11/26/2013  . CHF exacerbation ([place]) 11/25/2013  . Cardiomyopathy, ischemic 06/13/2013  . At risk for sudden cardiac death 2013/06/15  . S/P cardiac catheterization, Rt & Lt heart cath 06/05/2013 06/15/13  . Acute on chronic  combined systolic and diastolic HF (heart failure), NYHA class 3 ([place]) 06/04/2013  . Atrial flutter with rapid ventricular response ([place]) 06/02/2013  . Acute renal failure: Cr up to 1.8 06/02/2013  . Hyperkalemia 06/02/2013    Class: Acute  . LBBB (left bundle branch block) 06/01/2013  . Hypotension- secondary to AF and Diltiazem Rx 06/01/2013  . Obesity (BMI 30-39.9) 06/01/2013  . Acute on chronic diastolic heart failure - due to Afib AVR 06/01/2013  . Atrial fibrillation with RVR- converted with Amiodarone 05/31/2013  . Stroke- Lt brain 2003 04/08/2013  . Hemiplegia affecting right dominant side ([place]) 04/08/2013  . Cataract 04/08/2013  . GERD (gastroesophageal reflux disease) 04/08/2013  . S/P CABG x 3 - 2010 04/08/2013  . S/P AVR-tissue, 2010 04/08/2013  . DM2 (diabetes mellitus, type 2) ([place]) 04/08/2013  . Dyslipidemia 04/08/2013  . SUBACROMIAL BURSITIS, LEFT 11/05/2009    Teena Irani, PTA/CLT [phone] 09/21/2015, 3:09 PM  [place], [name],  [id number] Phone: [phone]   Fax:  [phone]  Name: SHAVONTE PORZIO MRN: [id number] Date of Birth: [birth year]

## 2015-09-22 ENCOUNTER — Ambulatory Visit: Payer: Self-pay

## 2015-09-22 ENCOUNTER — Other Ambulatory Visit: Payer: Self-pay

## 2015-09-22 NOTE — Patient Outreach (Signed)
[name]) Care Management  09/22/2015  [name] 1945/10/03 [id number]  Telephone call to patient for monthly outreach.  No answer HIPAA compliant voice message left.    Plan: RN Health Coach will contact patient within 1-2 weeks.    [name], RN, MSN [place] [phone]

## 2015-09-25 ENCOUNTER — Ambulatory Visit (HOSPITAL_COMMUNITY): Payer: Medicare Other | Admitting: Physical Therapy

## 2015-09-25 DIAGNOSIS — S91109D Unspecified open wound of unspecified toe(s) without damage to nail, subsequent encounter: Secondary | ICD-10-CM

## 2015-09-25 DIAGNOSIS — R262 Difficulty in walking, not elsewhere classified: Secondary | ICD-10-CM | POA: Diagnosis not present

## 2015-09-25 DIAGNOSIS — R2681 Unsteadiness on feet: Secondary | ICD-10-CM

## 2015-09-25 NOTE — Therapy (Deleted)
[place], [name], [address] Phone: [phone]   Fax:  [phone]  Wound Care Therapy  Patient Details  Name: [name] MRN: [id number] Date of Birth: Feb 16, 1946 No Data Recorded  Encounter Date: 09/25/2015      PT End of Session - 09/25/15 1122    Visit Number 43   Number of Visits 14   Date for PT Re-Evaluation 10/25/15   Authorization Type UHC Medicare: Gcode and reassessment complete visit 47   Authorization - Visit Number 73   Authorization - Number of Visits 41   PT Start Time 0830   PT Stop Time 0916   PT Time Calculation (min) 46 min   Activity Tolerance Patient tolerated treatment well   Behavior During Therapy [facility] for tasks assessed/performed      Past Medical History  Diagnosis Date  . Hypertension   . Diabetes mellitus     type 2   . History of valve replacement 2010    [facility] Ease bioprosthetic 13mm  . Thyroid disease   . Stroke [facility]) 2003    R-sided weakness & upper extremity swelling   . Coronary artery disease   . Dyslipidemia   . At risk for sudden cardiac death 2013/07/06  . PAF (paroxysmal atrial fibrillation) ([place]) 05/2013    converted with amiodarone to SR  . Chronic anticoagulation 05/2013    started  . S/P cardiac catheterization, Rt & Lt heart cath 06/05/2013 07/06/13  . S/P CABG x 3 2010  . GERD (gastroesophageal reflux disease)   . Cataract   . Chronic kidney disease     Patient reports he is seeing Kidney doctor in September    Past Surgical History  Procedure Laterality Date  . Coronary artery bypass graft  01/26/2009    LIMA to LAD, SVG to circumflex, SVG to PDA  . Aortic valve replacement  01/26/2009    [facility] Ease bioprosthetic 29mm valve  . Transthoracic echocardiogram  09/2011    grade 1 diastolic dysfunction; increasing valve gradient; calcified MV annulus   . Cardiac catheterization  06/05/2013    native 3 vessel disease; patent  LIMA to LAD, SVG to OM and SVG to PDA; Elevated right and left heart filling pressures, with predominantly pulmonary venous hypertension, normal PVR  . Transthoracic echocardiogram  06/03/2013    EF 25-30%, mod conc. hypertrophy, grade 3 diastolic dysfunction; LA mod dilated; calcified MV annulus; transaortic gradients are normal for the bioprosthetic valve; inf vena cava dilated (elevated CVP) - LifeVest  . Left and right heart catheterization with coronary angiogram N/A 06/05/2013    Procedure: LEFT AND RIGHT HEART CATHETERIZATION WITH CORONARY ANGIOGRAM;  Surgeon: Leonie Man, MD;  Location: [facility] CATH LAB;  Service: Cardiovascular;  Laterality: N/A;  . Graft(s) angiogram  06/05/2013    Procedure: GRAFT(S) Cyril Loosen;  Surgeon: Leonie Man, MD;  Location: [facility] CATH LAB;  Service: Cardiovascular;;  . Colonoscopy  2007    Dr. Aviva Signs: internal hemorrhoids    There were no vitals filed for this visit.  Visit Diagnosis:  Open toe wound, subsequent encounter  Difficulty walking  Unsteadiness                 Wound Therapy - 09/25/15 0925    Subjective Pt was to be reschedued to placement of stent in his rt LE but has not heard from the MD yet.  Therapist suggested to call  MD himself.  Patient and Family Stated Goals wound to heal    Pain Assessment No/denies pain   Wound Properties Date First Assessed: 06/23/15 Time First Assessed: 0900 Wound Type: Other (Comment) Location: Toe (Comment  which one) Location Orientation: Right Wound Description (Comments): medial  aspect of Rt great toe Present on Admission: no   Dressing Type Gauze (Comment)   Dressing Changed Changed   Dressing Status Intact;Old drainage   Dressing Change Frequency Every 3 days   Site / Wound Assessment Yellow;Granulation tissue;Pale   % Wound base Red or Granulating 30%   % Wound base Yellow 70%   Wound Length (cm) 1.7 cm   Wound Width (cm) 1.6 cm   Wound Depth (cm) --  varies from none to .3  as wound bed has crevasses.    Undermining (cm) minimal aroung edges    Margins Unattached edges (unapproximated)   Drainage Amount --  minimal to moderate    Drainage Description Serosanguineous   Treatment Cleansed;Debridement (Selective)   Incision Properties Date First Assessed: 04/23/15 Time First Assessed: 1738 Location: Groin Location Orientation: Left Present on Admission: No   Selective Debridement - Location wound bed and callous surrounding wound.    Selective Debridement - Tools Used Forceps;Scissors   Selective Debridement - Tissue Removed slough and callous    Wound Therapy - [place] was easier to remover today along with significant callous removal.  Toe is not as red any longer.  Overall area is improving. Pt has not recieved his stent for his LE therefore decreased blood flow is slowing healing.    Wound Therapy - Functional Problem List Non-healing wound  affecting gait.    Factors Delaying/Impairing Wound Healing Altered sensation;Diabetes Mellitus;Infection - systemic/local;Multiple medical problems;Polypharmacy;Vascular compromise   Hydrotherapy Plan Debridement;Dressing change   Wound Therapy - Current Recommendations PT   Wound Plan Pt will benefit from continued skilled physical therapy to prevent infection and improve wound bed for a healing environment.    Dressing  vaseline around periphery.  Pt had increased drainage today therefore silverhydrofiber was placed in wound bed itself.;gauze and kerlix        .                        G-Codes - October 11, 2015 1125    Functional Assessment Tool Used based on skilled clinical assessment of wound healling    Functional Limitation Other PT subsequent   Other PT Primary Current Status [id number]) At least 40 percent but less than 60 percent impaired, limited or restricted   Other PT Primary Goal Status [id number]) At least 1 percent but less than 20 percent impaired, limited or restricted        Problem List Patient Active Problem List   Diagnosis Date Noted  . Rectal bleeding 07/28/2015  . Chronic anticoagulation 07/28/2015  . PVD (peripheral vascular disease) ([place])   . S/P angioplasty 04/23/2015  . Peripheral arterial occlusive disease ([place]) 04/23/2015  . PAD (peripheral artery disease) ([place])   . Critical lower limb ischemia   . Toe ulcer, right ([place])   . Type II or unspecified type diabetes mellitus without mention of complication, uncontrolled 11/27/2013  . Acute respiratory failure with hypoxia ([place]) 11/26/2013  . CAP (community acquired pneumonia) 11/26/2013  . CHF exacerbation ([place]) 11/25/2013  . Cardiomyopathy, ischemic 06/13/2013  . At risk for sudden cardiac death Jun 23, 2013  . S/P cardiac catheterization, Rt & Lt heart cath 06/05/2013 2013/06/23  . Acute on chronic combined  systolic and diastolic HF (heart failure), NYHA class 3 ([place]) 06/04/2013  . Atrial flutter with rapid ventricular response ([place]) 06/02/2013  . Acute renal failure: Cr up to 1.8 06/02/2013  . Hyperkalemia 06/02/2013    Class: Acute  . LBBB (left bundle branch block) 06/01/2013  . Hypotension- secondary to AF and Diltiazem Rx 06/01/2013  . Obesity (BMI 30-39.9) 06/01/2013  . Acute on chronic diastolic heart failure - due to Afib AVR 06/01/2013  . Atrial fibrillation with RVR- converted with Amiodarone 05/31/2013  . Stroke- Lt brain 2003 04/08/2013  . Hemiplegia affecting right dominant side ([place]) 04/08/2013  . Cataract 04/08/2013  . GERD (gastroesophageal reflux disease) 04/08/2013  . S/P CABG x 3 - 2010 04/08/2013  . S/P AVR-tissue, 2010 04/08/2013  . DM2 (diabetes mellitus, type 2) ([place]) 04/08/2013  . Dyslipidemia 04/08/2013  . SUBACROMIAL BURSITIS, LEFT 11/05/2009    Rayetta Humphrey, PT CLT [phone] 09/25/2015, 11:30 AM  [place] [address] [place], [name], [address] Phone: [phone]   Fax:  [phone]  Name:  [name] MRN: [id number] Date of Birth: 1945/12/06

## 2015-09-25 NOTE — Therapy (Signed)
[place], [name], [address] Phone: [phone]   Fax:  [phone]  Wound Care Therapy  Patient Details  Name: [name] MRN: [id number] Date of Birth: 05-24-1946 No Data Recorded  Encounter Date: 09/25/2015      PT End of Session - 09/25/15 1122    Visit Number 43   Number of Visits 59   Date for PT Re-Evaluation 10/25/15   Authorization Type UHC Medicare: Gcode and reassessment complete visit 28   Authorization - Visit Number 81   Authorization - Number of Visits 46   PT Start Time 0830   PT Stop Time 0916   PT Time Calculation (min) 46 min   Activity Tolerance Patient tolerated treatment well   Behavior During Therapy [facility] for tasks assessed/performed      Past Medical History  Diagnosis Date  . Hypertension   . Diabetes mellitus     type 2   . History of valve replacement 2010    [facility] Ease bioprosthetic 5m  . Thyroid disease   . Stroke ([facility] 2003    R-sided weakness & upper extremity swelling   . Coronary artery disease   . Dyslipidemia   . At risk for sudden cardiac death 111-18-2014 . [place]    converted with amiodarone to SR  . Chronic anticoagulation 05/2013    started  . S/P cardiac catheterization, Rt & Lt heart cath 06/05/2013 111/18/14 . S/P CABG x 3 2010  . GERD (gastroesophageal reflux disease)   . Cataract   . Chronic kidney disease     Patient reports he is seeing Kidney doctor in September    Past Surgical History  Procedure Laterality Date  . Coronary artery bypass graft  01/26/2009    LIMA to LAD, SVG to circumflex, SVG to PDA  . Aortic valve replacement  01/26/2009    [facility]  . Transthoracic echocardiogram  09/2011    grade 1 diastolic dysfunction; increasing valve gradient; calcified MV annulus   . Cardiac catheterization  06/05/2013    native 3 vessel disease; patent  LIMA to LAD, SVG to OM and SVG to PDA; Elevated right and left heart filling pressures, with predominantly pulmonary venous hypertension, normal PVR  . Transthoracic echocardiogram  06/03/2013    EF 25-30%, mod conc. hypertrophy, grade 3 diastolic dysfunction; LA mod dilated; calcified MV annulus; transaortic gradients are normal for the bioprosthetic valve; inf vena cava dilated (elevated CVP) - LifeVest  . Left and right heart catheterization with coronary angiogram N/A 06/05/2013    Procedure: LEFT AND RIGHT HEART CATHETERIZATION WITH CORONARY ANGIOGRAM;  Surgeon: DaLeonie ManMD;  [facility];  Service: Cardiovascular;  Laterality: N/A;  . Graft(s) angiogram  06/05/2013    Procedure: GRAFT(S) ANCyril Loosen Surgeon: DaLeonie ManMD;  [facility];  Service: Cardiovascular;;  . Colonoscopy  2007    Dr. MaAviva Signsinternal hemorrhoids    There were no vitals filed for this visit.  Visit Diagnosis:  Open toe wound, subsequent encounter  Difficulty walking  Unsteadiness                 Wound Therapy - 09/25/15 1129    Subjective Pt was to be reschedued to placement of stent in his rt LE but has not heard from the MD yet.  Therapist suggested to call  MD himself.  Patient and Family Stated Goals wound to heal    Wound Properties Date First Assessed: 06/23/15 Time First Assessed: 0900 Wound Type: Other (Comment) Location: Toe (Comment  which one) Location Orientation: Right Wound Description (Comments): medial  aspect of Rt great toe Present on Admission: No   Dressing Type Gauze (Comment)   Dressing Status Intact;Old drainage   Dressing Change Frequency Every 3 days   Site / Wound Assessment Yellow;Granulation tissue;   % Wound base Red or Granulating 35%  was 15% on 09/11/2015   % Wound base Yellow 65%  was 85% on 09/11/2015   Margins Unattached edges (unapproximated)   Drainage Amount --  minimal to moderate    Drainage Description  Serosanguineous   Incision Properties Date First Assessed: 04/23/15 Time First Assessed: 1738 Location: Groin Location Orientation: Left Present on Admission: No   Selective Debridement - Location wound bed and callous surrounding wound.    Selective Debridement - Tools Used Forceps;Scissors   Selective Debridement - Tissue Removed slough and callous    Wound Therapy - Clinical Statement Pt wound continues to be slow to heal due to multiple comorbidities.  Pt lives alone and is unable to care for wound by himself due to limited use of Lt UE due to past stroke.  Pt needs continued skilled care to prevent wound from progressing into a systemic infection.  Slough was easier to remover today along with significant callous removal.  Toe is not as red any longer.  Overall area is improving. Pt has not recieved his stent for his LE therefore decreased blood flow is slowing healing.    Wound Therapy - Functional Problem List Non-healing wound  affecting gait.    Factors Delaying/Impairing Wound Healing Altered sensation;Diabetes Mellitus;Infection - systemic/local;Multiple medical problems;Polypharmacy;Vascular compromise   Hydrotherapy Plan Debridement;Dressing change   Wound Therapy - Current Recommendations PT   Wound Plan Pt will benefit from continued skilled physical therapy to prevent infection and improve wound bed for a healing environment.    Dressing  vaseline around periphery.  Pt had increased drainage today therefore silverhydrofiber was placed in wound bed itself.;gauze and kerlix    Decrease Necrotic Tissue to 20%   Increase Granulation Tissue to 80%   Decrease Length/Width/Depth by (cm) 1.0   Improve Drainage Characteristics Min   Patient/Family will be able to  Independent in dressing change   Additional Wound Therapy Goal full healing of wound   Goals/treatment plan/discharge plan were made with and agreed upon by patient/family Yes            09/25/15 1129  Wound Therapy Goals  - Improve the function of patient's integumentary system by progressing the wound(s) through the phases of wound healing by:  Decrease Necrotic Tissue to 20% progressing  Increase Granulation Tissue to 80% progressing  Decrease Length/Width/Depth by (cm) 1.0 not met but undermining is better   Improve Drainage Characteristics Min; progressing (depends on the day)  Patient/Family will be able to  Independent in dressing change:  Pt states he is unable to do this due the inability to use his Lt hand due to a previous stroke.   Additional Wound Therapy Goal full healing of wound: not met at this time   Goals/treatment plan/discharge plan were made with and agreed upon by patient/family Yes       Recommend continued treatment two times a week for eight more weeks or until wound is healed.  G-Codes - 09/25/15 1125    Functional Assessment Tool Used based on skilled clinical assessment of wound healling    Functional Limitation Other PT subsequent   Other PT Primary Current Status (H8887) At least 40 percent but less than 60 percent impaired, limited or restricted   Other PT Primary Goal Status (N7972) At least 1 percent but less than 20 percent impaired, limited or restricted       Problem List Patient Active Problem List   Diagnosis Date Noted  . Rectal bleeding 07/28/2015  . Chronic anticoagulation 07/28/2015  . PVD (peripheral vascular disease) ([place])   . S/P angioplasty 04/23/2015  . Peripheral arterial occlusive disease ([place]) 04/23/2015  . PAD (peripheral artery disease) ([place])   . Critical lower limb ischemia   . Toe ulcer, right ([place])   . Type II or unspecified type diabetes mellitus without mention of complication, uncontrolled 11/27/2013  . Acute respiratory failure with hypoxia ([place]) 11/26/2013  . CAP (community acquired pneumonia) 11/26/2013  . CHF exacerbation ([place]) 11/25/2013  . Cardiomyopathy, ischemic 06/13/2013  . At risk for sudden  cardiac death 25-Jun-2013  . S/P cardiac catheterization, Rt & Lt heart cath 06/05/2013 June 25, 2013  . Acute on chronic combined systolic and diastolic HF (heart failure), NYHA class 3 ([place]) 06/04/2013  . Atrial flutter with rapid ventricular response ([place]) 06/02/2013  . Acute renal failure: Cr up to 1.8 06/02/2013  . Hyperkalemia 06/02/2013    Class: Acute  . LBBB (left bundle branch block) 06/01/2013  . Hypotension- secondary to AF and Diltiazem Rx 06/01/2013  . Obesity (BMI 30-39.9) 06/01/2013  . Acute on chronic diastolic heart failure - due to Afib AVR 06/01/2013  . Atrial fibrillation with RVR- converted with Amiodarone 05/31/2013  . Stroke- Lt brain 2003 04/08/2013  . Hemiplegia affecting right dominant side ([place]) 04/08/2013  . Cataract 04/08/2013  . GERD (gastroesophageal reflux disease) 04/08/2013  . S/P CABG x 3 - 2010 04/08/2013  . S/P AVR-tissue, 2010 04/08/2013  . DM2 (diabetes mellitus, type 2) ([place]) 04/08/2013  . Dyslipidemia 04/08/2013  . SUBACROMIAL BURSITIS, LEFT 11/05/2009    Rayetta Humphrey, PT CLT [phone] 09/25/2015, 11:31 AM  [place] [address] [place], [name], [address] Phone: [phone]   Fax:  [phone]  Name: [name] MRN: [id number] Date of Birth: 31-Jan-1946    Physical Therapy Progress Note  Dates of Reporting Period: 09/05/2015  to 09/25/2015  Objective Reports of Subjective Statement: see above  Objective Measurements: see above   Goal Update: Pt will not be able to self dress   Plan: continue treating 2x a week for the next 8 weeks or until healed.   Reason Skilled Services are Required: PT high risk for systemic infection.  Pt is unable to properly take care of wound On his own due to living alone and having a previous stroke.   Rayetta Humphrey, [place] CLT [phone]

## 2015-09-29 ENCOUNTER — Ambulatory Visit (HOSPITAL_COMMUNITY): Payer: Medicare Other | Admitting: Physical Therapy

## 2015-09-29 DIAGNOSIS — S91109D Unspecified open wound of unspecified toe(s) without damage to nail, subsequent encounter: Secondary | ICD-10-CM

## 2015-09-29 DIAGNOSIS — R2681 Unsteadiness on feet: Secondary | ICD-10-CM | POA: Diagnosis not present

## 2015-09-29 DIAGNOSIS — R262 Difficulty in walking, not elsewhere classified: Secondary | ICD-10-CM

## 2015-09-29 NOTE — Therapy (Signed)
[place], [name], [address] Phone: [phone]   Fax:  [phone]  Wound Care Therapy  Patient Details  Name: [name] MRN: [id number] Date of Birth: 07-28-1946 No Data Recorded  Encounter Date: 09/29/2015    Past Medical History  Diagnosis Date  . Hypertension   . Diabetes mellitus     type 2   . History of valve replacement 2010    [facility] Ease bioprosthetic 72mm  . Thyroid disease   . Stroke [facility]) 2003    R-sided weakness & upper extremity swelling   . Coronary artery disease   . Dyslipidemia   . At risk for sudden cardiac death Jun 30, 2013  . PAF (paroxysmal atrial fibrillation) ([place]) 05/2013    converted with amiodarone to SR  . Chronic anticoagulation 05/2013    started  . S/P cardiac catheterization, Rt & Lt heart cath 06/05/2013 June 30, 2013  . S/P CABG x 3 2010  . GERD (gastroesophageal reflux disease)   . Cataract   . Chronic kidney disease     Patient reports he is seeing Kidney doctor in September    Past Surgical History  Procedure Laterality Date  . Coronary artery bypass graft  01/26/2009    LIMA to LAD, SVG to circumflex, SVG to PDA  . Aortic valve replacement  01/26/2009    [facility] Ease bioprosthetic 4mm valve  . Transthoracic echocardiogram  09/2011    grade 1 diastolic dysfunction; increasing valve gradient; calcified MV annulus   . Cardiac catheterization  06/05/2013    native 3 vessel disease; patent LIMA to LAD, SVG to OM and SVG to PDA; Elevated right and left heart filling pressures, with predominantly pulmonary venous hypertension, normal PVR  . Transthoracic echocardiogram  06/03/2013    EF 25-30%, mod conc. hypertrophy, grade 3 diastolic dysfunction; LA mod dilated; calcified MV annulus; transaortic gradients are normal for the bioprosthetic valve; inf vena cava dilated (elevated CVP) - LifeVest  . Left and right heart catheterization with coronary  angiogram N/A 06/05/2013    Procedure: LEFT AND RIGHT HEART CATHETERIZATION WITH CORONARY ANGIOGRAM;  Surgeon: Leonie Man, MD;  Location: [facility] C[facility] LAB;  Service: Cardiovascular;  Laterality: N/A;  . Graft(s) angiogram  06/05/2013    Procedure: GRAFT(S) Cyril Loosen;  Surgeon: Leonie Man, MD;  Location: [facility] CATH LAB;  Service: Cardiovascular;;  . Colonoscopy  2007    Dr. Aviva Signs: internal hemorrhoids    There were no vitals filed for this visit.  Visit Diagnosis:  Open toe wound, subsequent encounter  Difficulty walking  Unsteadiness                 Wound Therapy - 09/29/15 1713    Subjective PT states he is suppose to get his stents next week.  Reports no pain or issues.   Patient and Family Stated Goals wound to heal    Pain Assessment No/denies pain   Wound Properties Date First Assessed: 06/23/15 Time First Assessed: 0900 Wound Type: Other (Comment) Location: Toe (Comment  which one) Location Orientation: Right Wound Description (Comments): medial  aspect of Rt great toe Present on Admission: No   Dressing Type Gauze (Comment)   Dressing Changed Changed   Dressing Status Intact;Old drainage   Dressing Change Frequency Every 3 days   Site / Wound Assessment Yellow;Granulation tissue;Pale   % Wound base Red or Granulating 35%  was 15% on 09/11/2015   % Wound base Yellow 65%  was 85% on 09/11/2015   Margins Unattached edges (unapproximated)   Drainage Amount --  minimal to moderate    Drainage Description Serosanguineous;No odor   Treatment Cleansed;Debridement (Selective)   Incision Properties Date First Assessed: 04/23/15 Time First Assessed: 1738 Location: Groin Location Orientation: Left Present on Admission: No   Selective Debridement - Location wound bed and callous surrounding wound.    Selective Debridement - Tools Used Forceps;Scissors   Selective Debridement - Tissue Removed slough and callous    Wound Therapy - Clinical Statement Pt wound  continues to be slow to heal due to multiple comorbidities.  Debrided more of the edge of Wound bed to decrease undermining.  Completed dressing change with silver hydrofiber and 2in conform.  Dressing secured with medipore tape.   Wound Therapy - Functional Problem List Non-healing wound  affecting gait.    Factors Delaying/Impairing Wound Healing Altered sensation;Diabetes Mellitus;Infection - systemic/local;Multiple medical problems;Polypharmacy;Vascular compromise   Hydrotherapy Plan Debridement;Dressing change   Wound Therapy - Current Recommendations PT   Wound Plan Pt will benefit from continued skilled physical therapy to prevent infection and improve wound bed for a healing environment.    Dressing  vaseline around periphery.  Pt had increased drainage today therefore silverhydrofiber was placed in wound bed itself.;gauze and kerlix    Decrease Necrotic Tissue to 20%   Increase Granulation Tissue to 80%   Decrease Length/Width/Depth by (cm) 1.0   Improve Drainage Characteristics Min   Patient/Family will be able to  Independent in dressing change   Additional Wound Therapy Goal full healing of wound   Goals/treatment plan/discharge plan were made with and agreed upon by patient/family Yes                              Problem List Patient Active Problem List   Diagnosis Date Noted  . Rectal bleeding 07/28/2015  . Chronic anticoagulation 07/28/2015  . PVD (peripheral vascular disease) ([place])   . S/P angioplasty 04/23/2015  . Peripheral arterial occlusive disease ([place]) 04/23/2015  . PAD (peripheral artery disease) ([place])   . Critical lower limb ischemia   . Toe ulcer, right ([place])   . Type II or unspecified type diabetes mellitus without mention of complication, uncontrolled 11/27/2013  . Acute respiratory failure with hypoxia ([place]) 11/26/2013  . CAP (community acquired pneumonia) 11/26/2013  . CHF exacerbation ([place]) 11/25/2013  . Cardiomyopathy, ischemic  06/13/2013  . At risk for sudden cardiac death Jun 14, 2013  . S/P cardiac catheterization, Rt & Lt heart cath 06/05/2013 2013/06/14  . Acute on chronic combined systolic and diastolic HF (heart failure), NYHA class 3 ([place]) 06/04/2013  . Atrial flutter with rapid ventricular response ([place]) 06/02/2013  . Acute renal failure: Cr up to 1.8 06/02/2013  . Hyperkalemia 06/02/2013    Class: Acute  . LBBB (left bundle branch block) 06/01/2013  . Hypotension- secondary to AF and Diltiazem Rx 06/01/2013  . Obesity (BMI 30-39.9) 06/01/2013  . Acute on chronic diastolic heart failure - due to Afib AVR 06/01/2013  . Atrial fibrillation with RVR- converted with Amiodarone 05/31/2013  . Stroke- Lt brain 2003 04/08/2013  . Hemiplegia affecting right dominant side ([place]) 04/08/2013  . Cataract 04/08/2013  . GERD (gastroesophageal reflux disease) 04/08/2013  . S/P CABG x 3 - 2010 04/08/2013  . S/P AVR-tissue, 2010 04/08/2013  . DM2 (diabetes mellitus, type 2) ([place]) 04/08/2013  . Dyslipidemia 04/08/2013  . SUBACROMIAL BURSITIS, LEFT 11/05/2009  Teena Irani, PTA/CLT [phone]  09/29/2015, 6:03 PM  [place] [address] [place], [name], [address] Phone: [phone]   Fax:  [phone]  Name: ELLI HUDEK MRN: [id number] Date of Birth: 1946/01/23

## 2015-09-30 ENCOUNTER — Other Ambulatory Visit: Payer: Self-pay

## 2015-09-30 NOTE — Patient Outreach (Signed)
[place] [facility]) Care Management  [place]  09/30/2015   [name] Apr 16, 1946 [id number]  Subjective: Telephone call to patient for monthly call. Patient reports he is doing ok.  Patient reports he continues to go to the wound center for wound care to right foot.  Patient reports that it is healing. Discussed with patient importance of eating a healthy diet to promote healing and also the importance of maintaining good blood sugars to help with healing as well.  He verbalized understanding. Patient reports that his blood sugars have been some in the 200's. Discussed with patient new goal of keeping sugar less than 200.  He verbalized understanding.     Objective:   Current Medications:  Current Outpatient Prescriptions  Medication Sig Dispense Refill  . acetaminophen (TYLENOL) 500 MG tablet Take 1,000 mg by mouth every 6 (six) hours as needed. For pain/headaches     . ALPRAZolam (XANAX) 1 MG tablet Take 1 mg by mouth 4 (four) times daily as needed for anxiety.     Marland Kitchen amiodarone (PACERONE) 200 MG tablet TAKE ONE (1) TABLET EACH DAY 120 tablet 2  . apixaban (ELIQUIS) 5 MG TABS tablet Take 1 tablet (5 mg total) by mouth 2 (two) times daily. 180 tablet 1  . atorvastatin (LIPITOR) 20 MG tablet Take 1 tablet (20 mg total) by mouth daily at 6 PM. 90 tablet 1  . celecoxib (CELEBREX) 200 MG capsule Take 1 capsule by mouth 2 (two) times daily.    . clopidogrel (PLAVIX) 75 MG tablet Take 1 tablet (75 mg total) by mouth daily. 30 tablet 3  . docusate sodium (COLACE) 100 MG capsule Take 100 mg by mouth 2 (two) times daily.      . fenofibrate (TRICOR) 145 MG tablet Take 145 mg by mouth daily.    Marland Kitchen gabapentin (NEURONTIN) 300 MG capsule Take 300 mg by mouth 2 (two) times daily.     Marland Kitchen HYDROcodone-acetaminophen (NORCO) 10-325 MG per tablet Take 1 tablet by mouth as needed for severe pain.     . hydrocortisone (PROCTOSOL HC) 2.5 % rectal cream Place 1 application rectally 2 (two)  times daily. For 10 days (Patient taking differently: Place 1 application rectally daily as needed for hemorrhoids or itching. ) 30 g 0  . insulin glargine (LANTUS) 100 UNIT/ML injection Inject 60-75 Units into the skin 2 (two) times daily. Takes 75 units in the morning and 60 units in the evening    . insulin lispro (HUMALOG) 100 UNIT/ML injection Inject 35-50 Units into the skin 3 (three) times daily before meals.     . IRON PO Take 1 tablet by mouth daily.    Marland Kitchen levothyroxine (SYNTHROID, LEVOTHROID) 25 MCG tablet Take 25 mcg by mouth daily.      . metoprolol succinate (TOPROL-XL) 25 MG 24 hr tablet Take 1 tablet (25 mg total) by mouth daily. 90 tablet 1  . Omega-3 Fatty Acids (FISH OIL PO) Take 1 capsule by mouth 2 (two) times daily.     . pantoprazole (PROTONIX) 40 MG tablet Take 1 tablet (40 mg total) by mouth 2 (two) times daily. 180 tablet 1  . polyethylene glycol-electrolytes (NULYTELY/GOLYTELY) 420 G solution Take 4,000 mLs by mouth once. 4000 mL 0  . silver sulfADIAZINE (SILVADENE) 1 % cream Apply 1 application topically 2 (two) times daily.     Marland Kitchen torsemide (DEMADEX) 10 MG tablet Take 1 tablet (10 mg total) by mouth daily. 30 tablet 11   No  current facility-administered medications for this visit.    Functional Status:  In your present state of health, do you have any difficulty performing the following activities: 09/18/2015 08/21/2015  Hearing? N N  Vision? N N  Difficulty concentrating or making decisions? N N  Walking or climbing stairs? Y -  Dressing or bathing? N N  Doing errands, shopping? - Scientist, forensic and eating ? - N  Using the Toilet? - N  In the past six months, have you accidently leaked urine? - N  Do you have problems with loss of bowel control? - N  Managing your Medications? - N  Managing your Finances? - N  Housekeeping or managing your Housekeeping? - Y    Fall/Depression Screening: PHQ 2/9 Scores 09/30/2015 09/21/2015 08/25/2015 08/21/2015 07/30/2015  07/27/2015 06/29/2015  PHQ - 2 Score 2 2 0 0 0 0 0  PHQ- 9 Score 7 7 - - - - -    Assessment: Patient continues to benefit from health coach outreach for disease management and support  Plan:  [facility] CM Care Plan Problem Two        Most Recent Value   Care Plan Problem Two  Uncontrolled diabetes as evidenced by elevated CBGs, HGA1C and nonhealing wound.   Role Documenting the Problem Two  [place] for Problem Two  Active   Interventions for Problem Two Long Term Goal   RN Health Coach reviewed with patient signs of wound infection and importance of protein in his diet to promote healing.    THN Long Term Goal (31-90) days  Patient stated goal "I want my foot to heal", Patient foot will show improvement to wound over next 90 days   THN Long Term Goal Start Date  08/25/15 [Goal continued]   THN CM Short Term Goal #3 (0-30 days)  Patient CBG's will be under 200 within the next 30 days   THN CM Short Term Goal #3 Start Date  09/30/15 [goal continued]   Interventions for Short Term Goal #3  [place] discussed with patient the importance of limiting carbohydrates and sweets.  Also reviewed goal of keeping sugars less than 200.        RN Health Coach will contact patient within one month and patient agrees to next outreach.    [name], RN, MSN [place] [phone]

## 2015-10-01 ENCOUNTER — Ambulatory Visit (HOSPITAL_COMMUNITY): Payer: Medicare Other | Admitting: Physical Therapy

## 2015-10-01 DIAGNOSIS — R2681 Unsteadiness on feet: Secondary | ICD-10-CM | POA: Diagnosis not present

## 2015-10-01 DIAGNOSIS — R262 Difficulty in walking, not elsewhere classified: Secondary | ICD-10-CM

## 2015-10-01 DIAGNOSIS — S91109D Unspecified open wound of unspecified toe(s) without damage to nail, subsequent encounter: Secondary | ICD-10-CM | POA: Diagnosis not present

## 2015-10-01 NOTE — Therapy (Signed)
[place], [name], [address] Phone: [phone]   Fax:  [phone]  Wound Care Therapy  Patient Details  Name: [name] MRN: [id number] Date of Birth: [birth year] No Data Recorded  Encounter Date: 10/01/2015      PT End of Session - 10/01/15 1337    Visit Number 45   Number of Visits 3   Date for PT Re-Evaluation 10/25/15   Authorization Type UHC Medicare: Gcode and reassessment complete visit 33   Authorization - Visit Number 33   Authorization - Number of Visits 76   PT Start Time [id number]   PT Stop Time 1210   PT Time Calculation (min) 25 min   Activity Tolerance Patient tolerated treatment well   Behavior During Therapy [facility] for tasks assessed/performed      Past Medical History  Diagnosis Date  . Hypertension   . Diabetes mellitus     type 2   . History of valve replacement 2010    [facility] Ease bioprosthetic 43mm  . Thyroid disease   . Stroke [facility]) 2003    R-sided weakness & upper extremity swelling   . Coronary artery disease   . Dyslipidemia   . At risk for sudden cardiac death 06/13/2013  . PAF (paroxysmal atrial fibrillation) ([place]) 05/2013    converted with amiodarone to SR  . Chronic anticoagulation 05/2013    started  . S/P cardiac catheterization, Rt & Lt heart cath 06/05/2013 06/13/2013  . S/P CABG x 3 2010  . GERD (gastroesophageal reflux disease)   . Cataract   . Chronic kidney disease     Patient reports he is seeing Kidney doctor in September    Past Surgical History  Procedure Laterality Date  . Coronary artery bypass graft  01/26/2009    LIMA to LAD, SVG to circumflex, SVG to PDA  . Aortic valve replacement  01/26/2009    [facility] Ease bioprosthetic 69mm valve  . Transthoracic echocardiogram  09/2011    grade 1 diastolic dysfunction; increasing valve gradient; calcified MV annulus   . Cardiac catheterization  06/05/2013    native 3 vessel disease; patent  LIMA to LAD, SVG to OM and SVG to PDA; Elevated right and left heart filling pressures, with predominantly pulmonary venous hypertension, normal PVR  . Transthoracic echocardiogram  06/03/2013    EF 25-30%, mod conc. hypertrophy, grade 3 diastolic dysfunction; LA mod dilated; calcified MV annulus; transaortic gradients are normal for the bioprosthetic valve; inf vena cava dilated (elevated CVP) - LifeVest  . Left and right heart catheterization with coronary angiogram N/A 06/05/2013    Procedure: LEFT AND RIGHT HEART CATHETERIZATION WITH CORONARY ANGIOGRAM;  Surgeon: Leonie Man, MD;  Location: [facility] CATH LAB;  Service: Cardiovascular;  Laterality: N/A;  . Graft(s) angiogram  06/05/2013    Procedure: GRAFT(S) Cyril Loosen;  Surgeon: Leonie Man, MD;  Location: [facility] CATH LAB;  Service: Cardiovascular;;  . Colonoscopy  2007    Dr. Aviva Signs: internal hemorrhoids    There were no vitals filed for this visit.  Visit Diagnosis:  Open toe wound, subsequent encounter  Difficulty walking  Unsteadiness                 Wound Therapy - 10/01/15 1333    Subjective PT states he is suppose to get his stents next wednesday.  Reports no pain or issues.   Pain Assessment No/denies pain   Wound Properties Date First Assessed: 06/23/15  Time First Assessed: 0900 Wound Type: Other (Comment) Location: Toe (Comment  which one) Location Orientation: Right Wound Description (Comments): medial  aspect of Rt great toe Present on Admission: No   Dressing Type Gauze (Comment)   Dressing Changed Changed   Dressing Status Intact;Old drainage   Dressing Change Frequency Every 3 days   Site / Wound Assessment Yellow;Granulation tissue;Pale   % Wound base Red or Granulating 35%   % Wound base Yellow 65%   Wound Length (cm) 1.5 cm   Wound Width (cm) 1.5 cm   Wound Depth (cm) --  unknown at this time   Margins Unattached edges (unapproximated)   Drainage Description Serosanguineous;No odor    Treatment Cleansed;Debridement (Selective)   Incision Properties Date First Assessed: 04/23/15 Time First Assessed: 1738 Location: Groin Location Orientation: Left Present on Admission: No   Selective Debridement - Location wound bed and callous surrounding wound.    Selective Debridement - Tools Used Forceps;Scissors   Selective Debridement - Tissue Removed slough and callous    Wound Therapy - Clinical Statement Wound continues to remain as last session.  Very slow progress, however is smaller than last measurements.  Unsure how much undermining and depth is present.  Hoping stent placement will help oxygenate great toe to heal wound.  Pt without pain.   Wound Therapy - Functional Problem List Non-healing wound  affecting gait.    Factors Delaying/Impairing Wound Healing Altered sensation;Diabetes Mellitus;Infection - systemic/local;Multiple medical problems;Polypharmacy;Vascular compromise   Hydrotherapy Plan Debridement;Dressing change   Wound Therapy - Current Recommendations PT   Wound Plan Pt will benefit from continued skilled physical therapy to prevent infection and improve wound bed for a healing environment.    Dressing  vaseline around periphery.  Pt had increased drainage today therefore silverhydrofiber was placed in wound bed itself.;gauze and kerlix                               Problem List Patient Active Problem List   Diagnosis Date Noted  . Rectal bleeding 07/28/2015  . Chronic anticoagulation 07/28/2015  . PVD (peripheral vascular disease) ([place])   . S/P angioplasty 04/23/2015  . Peripheral arterial occlusive disease ([place]) 04/23/2015  . PAD (peripheral artery disease) ([place])   . Critical lower limb ischemia   . Toe ulcer, right ([place])   . Type II or unspecified type diabetes mellitus without mention of complication, uncontrolled 11/27/2013  . Acute respiratory failure with hypoxia ([place]) 11/26/2013  . CAP (community acquired pneumonia) 11/26/2013   . CHF exacerbation ([place]) 11/25/2013  . Cardiomyopathy, ischemic 06/13/2013  . At risk for sudden cardiac death 2013/07/01  . S/P cardiac catheterization, Rt & Lt heart cath 06/05/2013 07/01/13  . Acute on chronic combined systolic and diastolic HF (heart failure), NYHA class 3 ([place]) 06/04/2013  . Atrial flutter with rapid ventricular response ([place]) 06/02/2013  . Acute renal failure: Cr up to 1.8 06/02/2013  . Hyperkalemia 06/02/2013    Class: Acute  . LBBB (left bundle branch block) 06/01/2013  . Hypotension- secondary to AF and Diltiazem Rx 06/01/2013  . Obesity (BMI 30-39.9) 06/01/2013  . Acute on chronic diastolic heart failure - due to Afib AVR 06/01/2013  . Atrial fibrillation with RVR- converted with Amiodarone 05/31/2013  . Stroke- Lt brain 2003 04/08/2013  . Hemiplegia affecting right dominant side ([place]) 04/08/2013  . Cataract 04/08/2013  . GERD (gastroesophageal reflux disease) 04/08/2013  . S/P CABG x 3 -  2010 04/08/2013  . S/P AVR-tissue, 2010 04/08/2013  . DM2 (diabetes mellitus, type 2) ([place]) 04/08/2013  . Dyslipidemia 04/08/2013  . SUBACROMIAL BURSITIS, LEFT 11/05/2009    Teena Irani, PTA/CLT [phone]  10/01/2015, 1:38 PM  [place] [address] [place], [name], [address] Phone: [phone]   Fax:  [phone]  Name: [name] MRN: [id number] Date of Birth: 04-02-1946

## 2015-10-02 ENCOUNTER — Other Ambulatory Visit: Payer: Self-pay | Admitting: Internal Medicine

## 2015-10-02 NOTE — Telephone Encounter (Signed)
Rx refill sent to pharmacy. 

## 2015-10-05 ENCOUNTER — Telehealth (HOSPITAL_COMMUNITY): Payer: Self-pay | Admitting: Interventional Radiology

## 2015-10-05 NOTE — Telephone Encounter (Signed)
Called pt on his home phone and left him a detailed message reminding him not to take his Eliquis on Monday or Tuesday prior to his procedure that is scheduled with Jacqulynn Cadet, MD on Wednesday.  Also called pt's niece, Chelsea Primus, and spoke directly to her. She assured me that the Eliquis had been taken out of the patient's pill box for Monday and Tuesday. JM

## 2015-10-06 ENCOUNTER — Ambulatory Visit (HOSPITAL_COMMUNITY): Payer: Medicare Other | Admitting: Physical Therapy

## 2015-10-06 ENCOUNTER — Other Ambulatory Visit: Payer: Self-pay | Admitting: Radiology

## 2015-10-06 DIAGNOSIS — S91109D Unspecified open wound of unspecified toe(s) without damage to nail, subsequent encounter: Secondary | ICD-10-CM | POA: Diagnosis not present

## 2015-10-06 DIAGNOSIS — R262 Difficulty in walking, not elsewhere classified: Secondary | ICD-10-CM | POA: Diagnosis not present

## 2015-10-06 DIAGNOSIS — R2681 Unsteadiness on feet: Secondary | ICD-10-CM | POA: Diagnosis not present

## 2015-10-06 NOTE — Therapy (Signed)
[place], [name], [address] Phone: [phone]   Fax:  [phone]  Wound Care Therapy  Patient Details  Name: [name] MRN: [id number] Date of Birth: August 31, 1945 No Data Recorded  Encounter Date: 10/06/2015      PT End of Session - 10/06/15 1000    Visit Number 37   Number of Visits 51   Date for PT Re-Evaluation 10/25/15   Authorization Type UHC Medicare: Gcode and reassessment complete visit 42   Authorization - Visit Number 62   Authorization - Number of Visits 31   PT Start Time 0900   PT Stop Time 0930   PT Time Calculation (min) 30 min   Activity Tolerance Patient tolerated treatment well   Behavior During Therapy WFL for tasks assessed/performed      Past Medical History  Diagnosis Date  . Hypertension   . Diabetes mellitus     type 2   . History of valve replacement 2010    [facility] Ease bioprosthetic 5m  . Thyroid disease   . Stroke ([facility] 2003    R-sided weakness & upper extremity swelling   . Coronary artery disease   . Dyslipidemia   . At risk for sudden cardiac death 111-Nov-2014 . PA[place] 05[place]    converted with amiodarone to SR  . Chronic anticoagulation 05/2013    started  . S/P cardiac catheterization, Rt & Lt heart cath 06/05/2013 111/06/2013 . S/P CABG x 3 2010  . GERD (gastroesophageal reflux disease)   . Cataract   . Chronic kidney disease     Patient reports he is seeing Kidney doctor in September    Past Surgical History  Procedure Laterality Date  . Coronary artery bypass graft  01/26/2009    LIMA to LAD, SVG to circumflex, SVG to PDA  . [facility]  [facility]  . Transthoracic echocardiogram  09/2011    grade 1 diastolic dysfunction; increasing valve gradient; calcified MV annulus   . Cardiac catheterization  06/05/2013    native 3 vessel disease; patent  LIMA to LAD, SVG to OM and SVG to PDA; Elevated right and left heart filling pressures, with predominantly pulmonary venous hypertension, normal PVR  . Transthoracic echocardiogram  06/03/2013    EF 25-30%, mod conc. hypertrophy, grade 3 diastolic dysfunction; LA mod dilated; calcified MV annulus; transaortic gradients are normal for the bioprosthetic valve; inf vena cava dilated (elevated CVP) - LifeVest  . Left and right heart catheterization with coronary angiogram N/A 06/05/2013    Procedure: LEFT AND RIGHT HEART CATHETERIZATION WITH CORONARY ANGIOGRAM;  Surgeon: DaLeonie ManMD;  [facility];  Service: Cardiovascular;  Laterality: N/A;  . Graft(s) angiogram  06/05/2013    Procedure: GRAFT(S) ANCyril Loosen Surgeon: DaLeonie ManMD;  [facility];  Service: Cardiovascular;;  . Colonoscopy  2007    Dr. MaAviva Signsinternal hemorrhoids    There were no vitals filed for this visit.  Visit Diagnosis:  Open toe wound, subsequent encounter                 Wound Therapy - 10/06/15 0953    Subjective PT states he is suppose to get his stents next wednesday.  Reports no pain, just itching   Pain Assessment No/denies pain   Wound Properties Date First Assessed: 06/23/15 Time First Assessed: 0900 Wound  Type: Other (Comment) Location: Toe (Comment  which one) Location Orientation: Right Wound Description (Comments): medial  aspect of Rt great toe Present on Admission: No   Dressing Type Gauze (Comment)   Dressing Changed Changed   Dressing Status Intact;Old drainage   Dressing Change Frequency Every 3 days   Site / Wound Assessment Yellow;Granulation tissue;Pale   % Wound base Red or Granulating 35%   % Wound base Yellow 65%   Undermining (cm) around all edges   Margins Unattached edges (unapproximated)   Drainage Amount Minimal   Drainage Description Serosanguineous;No odor   Treatment Cleansed;Debridement (Selective)   Incision Properties Date First  Assessed: 04/23/15 Time First Assessed: 1738 Location: Groin Location Orientation: Left Present on Admission: No   Selective Debridement - Location wound bed and callous surrounding wound.    Selective Debridement - Tools Used Forceps;Scissors   Selective Debridement - Tissue Removed slough and callous    Wound Therapy - Clinical Statement Wound continues to progress slowly due to comorbidities.  Wound still presents with undermining entire perimeter.  Able to debride edges to promote approximation.  No signs/symptoms of infection present, redness remains dorsal aspect of great toe.  Pt to receive stent placement tomorrow with hopes to increase oxygenation to LE's to assist with wound healing.   Wound Therapy - Functional Problem List Non-healing wound  affecting gait.    Factors Delaying/Impairing Wound Healing Altered sensation;Diabetes Mellitus;Infection - systemic/local;Multiple medical problems;Polypharmacy;Vascular compromise   Hydrotherapy Plan Debridement;Dressing change   Wound Therapy - Current Recommendations PT   Wound Plan Pt will benefit from continued skilled physical therapy to prevent infection and improve wound bed for a healing environment. May consider changing dressing to medihoney alginate next session to help promote increased granulation.   Measure next session.   Dressing  vaseline around periphery.  Silverhydrofiber was placed in wound bed itself.;gauze and kerlix    Decrease Necrotic Tissue to 20%   Decrease Necrotic Tissue - Progress Progressing toward goal   Increase Granulation Tissue to 80%   Increase Granulation Tissue - Progress Progressing toward goal   Decrease Length/Width/Depth by (cm) 1.0   Decrease Length/Width/Depth - Progress Progressing toward goal   Improve Drainage Characteristics Min   Improve Drainage Characteristics - Progress Met   Patient/Family will be able to  Independent in dressing change   Patient/Family Instruction Goal - Progress Not  progressing  pt unable   Additional Wound Therapy Goal full healing of wound   Additional Wound Therapy Goal - Progress Progressing toward goal                              Problem List Patient Active Problem List   Diagnosis Date Noted  . Rectal bleeding 07/28/2015  . Chronic anticoagulation 07/28/2015  . PVD (peripheral vascular disease) ([place])   . S/P angioplasty 04/23/2015  . Peripheral arterial occlusive disease ([place]) 04/23/2015  . PAD (peripheral artery disease) ([place])   . Critical lower limb ischemia   . Toe ulcer, right ([place])   . Type II or unspecified type diabetes mellitus without mention of complication, uncontrolled 11/27/2013  . Acute respiratory failure with hypoxia ([place]) 11/26/2013  . CAP (community acquired pneumonia) 11/26/2013  . CHF exacerbation ([place]) 11/25/2013  . Cardiomyopathy, ischemic 06/13/2013  . At risk for sudden cardiac death 2013-06-15  . S/P cardiac catheterization, Rt & Lt heart cath 06/05/2013 06/15/2013  . Acute on chronic combined systolic and diastolic HF (heart  failure), NYHA class 3 ([place]) 06/04/2013  . Atrial flutter with rapid ventricular response ([place]) 06/02/2013  . Acute renal failure: Cr up to 1.8 06/02/2013  . Hyperkalemia 06/02/2013    Class: Acute  . LBBB (left bundle branch block) 06/01/2013  . Hypotension- secondary to AF and Diltiazem Rx 06/01/2013  . Obesity (BMI 30-39.9) 06/01/2013  . Acute on chronic diastolic heart failure - due to Afib AVR 06/01/2013  . Atrial fibrillation with RVR- converted with Amiodarone 05/31/2013  . Stroke- Lt brain 2003 04/08/2013  . Hemiplegia affecting right dominant side ([place]) 04/08/2013  . Cataract 04/08/2013  . GERD (gastroesophageal reflux disease) 04/08/2013  . S/P CABG x 3 - 2010 04/08/2013  . S/P AVR-tissue, 2010 04/08/2013  . DM2 (diabetes mellitus, type 2) ([place]) 04/08/2013  . Dyslipidemia 04/08/2013  . SUBACROMIAL BURSITIS, LEFT 11/05/2009    Teena Irani,  PTA/CLT [phone]  10/06/2015, 10:06 AM  [place] [address] [place], [name], [address] Phone: [phone]   Fax:  [phone]  Name: [name] MRN: [id number] Date of Birth: 07-17-1946

## 2015-10-07 ENCOUNTER — Encounter (HOSPITAL_COMMUNITY): Payer: Self-pay

## 2015-10-07 ENCOUNTER — Ambulatory Visit (HOSPITAL_COMMUNITY)
Admission: RE | Admit: 2015-10-07 | Discharge: 2015-10-07 | Disposition: A | Discharge status: Home / Self Care | Payer: Medicare Other | Source: Ambulatory Visit | Attending: Interventional Radiology | Admitting: Interventional Radiology

## 2015-10-07 ENCOUNTER — Other Ambulatory Visit (HOSPITAL_COMMUNITY): Payer: Self-pay | Admitting: Interventional Radiology

## 2015-10-07 DIAGNOSIS — I480 Paroxysmal atrial fibrillation: Secondary | ICD-10-CM | POA: Diagnosis not present

## 2015-10-07 DIAGNOSIS — I70235 Atherosclerosis of native arteries of right leg with ulceration of other part of foot: Secondary | ICD-10-CM | POA: Insufficient documentation

## 2015-10-07 DIAGNOSIS — E1122 Type 2 diabetes mellitus with diabetic chronic kidney disease: Secondary | ICD-10-CM | POA: Insufficient documentation

## 2015-10-07 DIAGNOSIS — I70229 Atherosclerosis of native arteries of extremities with rest pain, unspecified extremity: Secondary | ICD-10-CM

## 2015-10-07 DIAGNOSIS — Y812 Prosthetic and other implants, materials and accessory general- and plastic-surgery devices associated with adverse incidents: Secondary | ICD-10-CM | POA: Diagnosis not present

## 2015-10-07 DIAGNOSIS — I998 Other disorder of circulatory system: Secondary | ICD-10-CM

## 2015-10-07 DIAGNOSIS — Z951 Presence of aortocoronary bypass graft: Secondary | ICD-10-CM | POA: Diagnosis not present

## 2015-10-07 DIAGNOSIS — N189 Chronic kidney disease, unspecified: Secondary | ICD-10-CM | POA: Insufficient documentation

## 2015-10-07 DIAGNOSIS — I69351 Hemiplegia and hemiparesis following cerebral infarction affecting right dominant side: Secondary | ICD-10-CM | POA: Diagnosis not present

## 2015-10-07 DIAGNOSIS — E785 Hyperlipidemia, unspecified: Secondary | ICD-10-CM | POA: Insufficient documentation

## 2015-10-07 DIAGNOSIS — I771 Stricture of artery: Secondary | ICD-10-CM | POA: Diagnosis not present

## 2015-10-07 DIAGNOSIS — Z7902 Long term (current) use of antithrombotics/antiplatelets: Secondary | ICD-10-CM | POA: Insufficient documentation

## 2015-10-07 DIAGNOSIS — Z952 Presence of prosthetic heart valve: Secondary | ICD-10-CM | POA: Diagnosis not present

## 2015-10-07 DIAGNOSIS — Z8249 Family history of ischemic heart disease and other diseases of the circulatory system: Secondary | ICD-10-CM | POA: Diagnosis not present

## 2015-10-07 DIAGNOSIS — E1151 Type 2 diabetes mellitus with diabetic peripheral angiopathy without gangrene: Secondary | ICD-10-CM | POA: Insufficient documentation

## 2015-10-07 DIAGNOSIS — L97519 Non-pressure chronic ulcer of other part of right foot with unspecified severity: Secondary | ICD-10-CM

## 2015-10-07 DIAGNOSIS — E11621 Type 2 diabetes mellitus with foot ulcer: Secondary | ICD-10-CM | POA: Diagnosis not present

## 2015-10-07 DIAGNOSIS — I7092 Chronic total occlusion of artery of the extremities: Secondary | ICD-10-CM | POA: Insufficient documentation

## 2015-10-07 DIAGNOSIS — I129 Hypertensive chronic kidney disease with stage 1 through stage 4 chronic kidney disease, or unspecified chronic kidney disease: Secondary | ICD-10-CM | POA: Diagnosis not present

## 2015-10-07 DIAGNOSIS — E079 Disorder of thyroid, unspecified: Secondary | ICD-10-CM | POA: Diagnosis not present

## 2015-10-07 DIAGNOSIS — T82856A Stenosis of peripheral vascular stent, initial encounter: Secondary | ICD-10-CM | POA: Diagnosis not present

## 2015-10-07 DIAGNOSIS — Z794 Long term (current) use of insulin: Secondary | ICD-10-CM | POA: Diagnosis not present

## 2015-10-07 DIAGNOSIS — Z87891 Personal history of nicotine dependence: Secondary | ICD-10-CM | POA: Insufficient documentation

## 2015-10-07 DIAGNOSIS — K219 Gastro-esophageal reflux disease without esophagitis: Secondary | ICD-10-CM | POA: Diagnosis not present

## 2015-10-07 DIAGNOSIS — Z7901 Long term (current) use of anticoagulants: Secondary | ICD-10-CM | POA: Insufficient documentation

## 2015-10-07 DIAGNOSIS — I2510 Atherosclerotic heart disease of native coronary artery without angina pectoris: Secondary | ICD-10-CM | POA: Diagnosis not present

## 2015-10-07 LAB — CBC
HEMATOCRIT: 39.8 % (ref 39.0–52.0)
HEMOGLOBIN: 13.5 g/dL (ref 13.0–17.0)
MCH: 29.8 pg (ref 26.0–34.0)
MCHC: 33.9 g/dL (ref 30.0–36.0)
MCV: 87.9 fL (ref 78.0–100.0)
Platelets: 135 10*3/uL — ABNORMAL LOW (ref 150–400)
RBC: 4.53 MIL/uL (ref 4.22–5.81)
RDW: 13 % (ref 11.5–15.5)
WBC: 5.2 10*3/uL (ref 4.0–10.5)

## 2015-10-07 LAB — GLUCOSE, CAPILLARY
GLUCOSE-CAPILLARY: 301 mg/dL — AB (ref 65–99)
Glucose-Capillary: 338 mg/dL — ABNORMAL HIGH (ref 65–99)

## 2015-10-07 LAB — BASIC METABOLIC PANEL
ANION GAP: 14 (ref 5–15)
BUN: 19 mg/dL (ref 6–20)
CALCIUM: 9.2 mg/dL (ref 8.9–10.3)
CO2: 23 mmol/L (ref 22–32)
Chloride: 101 mmol/L (ref 101–111)
Creatinine, Ser: 1.43 mg/dL — ABNORMAL HIGH (ref 0.61–1.24)
GFR, EST AFRICAN AMERICAN: 57 mL/min — AB (ref >60)
GFR, EST NON AFRICAN AMERICAN: 49 mL/min — AB (ref >60)
GLUCOSE: 340 mg/dL — AB (ref 65–99)
POTASSIUM: 4.4 mmol/L (ref 3.5–5.1)
SODIUM: 138 mmol/L (ref 135–145)

## 2015-10-07 LAB — PROTIME-INR
INR: 1.02 (ref 0.00–1.49)
PROTHROMBIN TIME: 13.6 s (ref 11.6–15.2)

## 2015-10-07 LAB — APTT: aPTT: 29 seconds (ref 24–37)

## 2015-10-07 MED ORDER — MIDAZOLAM HCL 2 MG/2ML IJ SOLN
INTRAMUSCULAR | Status: AC | PRN
  Administered 2015-10-07 (×2): 1 mg via INTRAVENOUS
  Administered 2015-10-07: 2 mg via INTRAVENOUS
  Administered 2015-10-07: 1 mg via INTRAVENOUS
  Administered 2015-10-07: 2 mg via INTRAVENOUS
  Administered 2015-10-07: 1 mg via INTRAVENOUS

## 2015-10-07 MED ORDER — NITROGLYCERIN 1 MG/10 ML FOR IR/CATH LAB
INTRA_ARTERIAL | Status: AC | PRN
  Administered 2015-10-07: 100 ug via INTRA_ARTERIAL
  Administered 2015-10-07 (×2): 200 ug via INTRA_ARTERIAL

## 2015-10-07 MED ORDER — INSULIN ASPART 100 UNIT/ML ~~LOC~~ SOLN
SUBCUTANEOUS | Status: AC
  Administered 2015-10-07: 15 [IU] via SUBCUTANEOUS
  Filled 2015-10-07: qty 1

## 2015-10-07 MED ORDER — NITROGLYCERIN 1 MG/10 ML FOR IR/CATH LAB
INTRA_ARTERIAL | Status: AC
  Filled 2015-10-07: qty 20

## 2015-10-07 MED ORDER — FENTANYL CITRATE (PF) 100 MCG/2ML IJ SOLN
INTRAMUSCULAR | Status: AC
  Filled 2015-10-07: qty 4

## 2015-10-07 MED ORDER — FENTANYL CITRATE (PF) 100 MCG/2ML IJ SOLN
INTRAMUSCULAR | Status: AC
  Filled 2015-10-07: qty 2

## 2015-10-07 MED ORDER — INSULIN ASPART 100 UNIT/ML ~~LOC~~ SOLN
0.0000 [IU] | Freq: Three times a day (TID) | SUBCUTANEOUS | Status: DC
  Administered 2015-10-07: 15 [IU] via SUBCUTANEOUS

## 2015-10-07 MED ORDER — IODIXANOL 320 MG/ML IV SOLN
100.0000 mL | Freq: Once | INTRAVENOUS | Status: AC | PRN
  Administered 2015-10-07: 75 mL via INTRAVENOUS

## 2015-10-07 MED ORDER — MIDAZOLAM HCL 2 MG/2ML IJ SOLN
INTRAMUSCULAR | Status: AC
Start: 2015-10-07 — End: 2015-10-07
  Filled 2015-10-07: qty 2

## 2015-10-07 MED ORDER — HEPARIN SODIUM (PORCINE) 1000 UNIT/ML IJ SOLN
INTRAMUSCULAR | Status: AC | PRN
  Administered 2015-10-07: 8000 [IU] via INTRAVENOUS

## 2015-10-07 MED ORDER — INSULIN ASPART 100 UNIT/ML ~~LOC~~ SOLN
SUBCUTANEOUS | Status: DC
Start: 2015-10-07 — End: 2015-10-08
  Filled 2015-10-07: qty 1

## 2015-10-07 MED ORDER — LIDOCAINE HCL 1 % IJ SOLN
INTRAMUSCULAR | Status: AC
  Filled 2015-10-07: qty 20

## 2015-10-07 MED ORDER — MIDAZOLAM HCL 2 MG/2ML IJ SOLN
INTRAMUSCULAR | Status: AC
  Filled 2015-10-07: qty 2

## 2015-10-07 MED ORDER — MIDAZOLAM HCL 2 MG/2ML IJ SOLN
INTRAMUSCULAR | Status: AC
  Filled 2015-10-07: qty 4

## 2015-10-07 MED ORDER — SODIUM CHLORIDE 0.9 % IV SOLN
Freq: Once | INTRAVENOUS | Status: AC
  Administered 2015-10-07: 08:00:00 via INTRAVENOUS

## 2015-10-07 MED ORDER — VERAPAMIL HCL 2.5 MG/ML IV SOLN
INTRAVENOUS | Status: AC
  Filled 2015-10-07: qty 2

## 2015-10-07 MED ORDER — FENTANYL CITRATE (PF) 100 MCG/2ML IJ SOLN
INTRAMUSCULAR | Status: AC | PRN
  Administered 2015-10-07 (×4): 50 ug via INTRAVENOUS
  Administered 2015-10-07: 100 ug via INTRAVENOUS
  Administered 2015-10-07 (×2): 50 ug via INTRAVENOUS

## 2015-10-07 MED ORDER — HEPARIN SODIUM (PORCINE) 1000 UNIT/ML IJ SOLN
INTRAMUSCULAR | Status: AC
  Filled 2015-10-07: qty 1

## 2015-10-07 NOTE — Sedation Documentation (Signed)
Patient denies pain and is resting comfortably.  

## 2015-10-07 NOTE — Sedation Documentation (Signed)
R groin arterial access site- IR team holding pressure. Closed with angioseal. Bedrest will be 4 hrs

## 2015-10-07 NOTE — Sedation Documentation (Signed)
Patient is resting comfortably. 

## 2015-10-07 NOTE — Sedation Documentation (Signed)
Patient denies pain and is resting comfortably.  Maintaining sedation during procedure.

## 2015-10-07 NOTE — Sedation Documentation (Signed)
Pt. Becoming uncomfortable due to procedure progress. Additional meds given to maintain comfort.

## 2015-10-07 NOTE — Sedation Documentation (Signed)
Pt c/p of procedural pain.meds given.

## 2015-10-07 NOTE — Progress Notes (Signed)
Assumed care of pt from Elmer Ramp, Therapist, sports. No IVF's running on assessment @ 1645.Assessment documented.

## 2015-10-07 NOTE — Sedation Documentation (Signed)
Dsg to L groin- pressure holding complete. Bedrest til 5:30pm

## 2015-10-07 NOTE — H&P (Signed)
Chief Complaint: Patient was seen in consultation today for Right lower extremity arteriogram with possible angioplasty/stent placement at the request of Dr Felicie Morn  Referring Physician(s): McCullough,[place] Dr Felicie Morn  History of Present Illness: [name] is a 70 y.o. male   History of Rutherford stage V critical limb ischemia with a slowly healing ulcer of the right great toe. He has extensive peripheral arterial disease and underwent recanalization of his occluded right superficial femoral artery on 04/23/2015.  Pt states toe may be slowly healing but sees no real progression as far as he can tell Last dose Eliquis 2/19 Last dose Plavix 2/21  Schedule for RLE angiogram and revascularization of the PT and AT with orbital atherectomy and PTA. May require arterial access both at the groin, and the right foot.  Blood sugar 383 this am- asymptomatic  Past Medical History  Diagnosis Date  . Hypertension   . Diabetes mellitus     type 2   . History of valve replacement 2010    [facility] Ease bioprosthetic 7mm  . Thyroid disease   . Stroke [facility]) 2003    R-sided weakness & upper extremity swelling   . Coronary artery disease   . Dyslipidemia   . At risk for sudden cardiac death 06/10/2013  . PAF (paroxysmal atrial fibrillation) ([place]) 05/2013    converted with amiodarone to SR  . Chronic anticoagulation 05/2013    started  . S/P cardiac catheterization, Rt & Lt heart cath 06/05/2013 06-10-2013  . S/P CABG x 3 2010  . GERD (gastroesophageal reflux disease)   . Cataract   . Chronic kidney disease     Patient reports he is seeing Kidney doctor in September    Past Surgical History  Procedure Laterality Date  . Coronary artery bypass graft  01/26/2009    LIMA to LAD, SVG to circumflex, SVG to PDA  . Aortic valve replacement  01/26/2009    [facility] 2 Ease bioprosthetic 77mm valve  . Transthoracic echocardiogram  09/2011    grade 1 diastolic  dysfunction; increasing valve gradient; calcified MV annulus   . Cardiac catheterization  06/05/2013    native 3 vessel disease; patent LIMA to LAD, SVG to OM and SVG to PDA; Elevated right and left heart filling pressures, with predominantly pulmonary venous hypertension, normal PVR  . Transthoracic echocardiogram  06/03/2013    EF 25-30%, mod conc. hypertrophy, grade 3 diastolic dysfunction; LA mod dilated; calcified MV annulus; transaortic gradients are normal for the bioprosthetic valve; inf vena cava dilated (elevated CVP) - LifeVest  . Left and right heart catheterization with coronary angiogram N/A 06/05/2013    Procedure: LEFT AND RIGHT HEART CATHETERIZATION WITH CORONARY ANGIOGRAM;  Surgeon: Leonie Man, MD;  Location: [facility] CATH LAB;  Service: Cardiovascular;  Laterality: N/A;  . Graft(s) angiogram  06/05/2013    Procedure: GRAFT(S) Cyril Loosen;  Surgeon: Leonie Man, MD;  Location: [facility] CATH LAB;  Service: Cardiovascular;;  . Colonoscopy  2007    Dr. Aviva Signs: internal hemorrhoids    Allergies: Review of patient's allergies indicates no known allergies.  Medications: Prior to Admission medications   Medication Sig Start Date End Date Taking? Authorizing Provider  ALPRAZolam Duanne Moron) 1 MG tablet Take 1 mg by mouth 4 (four) times daily as needed for anxiety.    Yes Historical Provider, MD  amiodarone (PACERONE) 200 MG tablet TAKE ONE (1) TABLET EACH DAY 07/15/15  Yes Pixie Casino, MD  apixaban (ELIQUIS) 5 MG  TABS tablet Take 1 tablet (5 mg total) by mouth 2 (two) times daily. 04/15/15  Yes Pixie Casino, MD  atorvastatin (LIPITOR) 20 MG tablet Take 1 tablet (20 mg total) by mouth daily at 6 PM. 10/30/14  Yes Pixie Casino, MD  celecoxib (CELEBREX) 200 MG capsule Take 1 capsule by mouth every evening.  04/07/14  Yes Historical Provider, MD  clopidogrel (PLAVIX) 75 MG tablet Take 1 tablet (75 mg total) by mouth daily. Patient taking differently: Take 75 mg by mouth every  evening.  04/25/15  Yes Hedy Jacob, PA-C  docusate sodium (COLACE) 100 MG capsule Take 100 mg by mouth 2 (two) times daily.     Yes Historical Provider, MD  fenofibrate (TRICOR) 145 MG tablet Take 145 mg by mouth daily.   Yes Historical Provider, MD  ferrous sulfate 325 (65 FE) MG EC tablet Take 325 mg by mouth daily with breakfast.   Yes Historical Provider, MD  gabapentin (NEURONTIN) 300 MG capsule Take 300 mg by mouth 2 (two) times daily.    Yes Historical Provider, MD  HYDROcodone-acetaminophen (NORCO) 10-325 MG per tablet Take 0.5 tablets by mouth 2 (two) times daily.  03/31/14  Yes Historical Provider, MD  insulin glargine (LANTUS) 100 UNIT/ML injection Inject 60-75 Units into the skin 2 (two) times daily. Takes 75 units in the morning and 60 units in the evening   Yes Historical Provider, MD  insulin lispro (HUMALOG) 100 UNIT/ML injection Inject 35-50 Units into the skin 3 (three) times daily before meals.    Yes Historical Provider, MD  levothyroxine (SYNTHROID, LEVOTHROID) 25 MCG tablet Take 25 mcg by mouth daily.     Yes Historical Provider, MD  metoprolol succinate (TOPROL-XL) 25 MG 24 hr tablet TAKE ONE (1) TABLET EACH DAY 10/02/15  Yes Pixie Casino, MD  Omega-3 Fatty Acids (FISH OIL) 1200 MG CAPS Take 1,200 mg by mouth 2 (two) times daily.   Yes Historical Provider, MD  pantoprazole (PROTONIX) 40 MG tablet Take 1 tablet (40 mg total) by mouth 2 (two) times daily. 10/30/14  Yes Pixie Casino, MD  silver sulfADIAZINE (SILVADENE) 1 % cream Apply 1 application topically 2 (two) times daily.    Yes Historical Provider, MD  torsemide (DEMADEX) 10 MG tablet Take 1 tablet (10 mg total) by mouth daily. 06/29/15  Yes Pixie Casino, MD  acetaminophen (TYLENOL) 500 MG tablet Take 1,000 mg by mouth 2 (two) times daily as needed for headache. For pain/headaches    Historical Provider, MD  hydrocortisone (PROCTOSOL HC) 2.5 % rectal cream Place 1 application rectally 2 (two) times daily. For 10  days Patient not taking: Reported on 10/07/2015 07/28/15   Mahala Menghini, PA-C  polyethylene glycol-electrolytes (NULYTELY/GOLYTELY) 420 G solution Take 4,000 mLs by mouth once. Patient not taking: Reported on 10/07/2015 08/05/15   Mahala Menghini, PA-C     Family History  Problem Relation Age of Onset  . Heart attack Mother   . Liver disease Brother   . Diabetes Sister     x4    Social History   Social History  . Marital Status: Divorced    Spouse Name: N/A  . Number of Children: 4  . Years of Education: N/A   Occupational History  . 4    Social History Main Topics  . Smoking status: Former Smoker    Types: Cigarettes    Quit date: 08/15/2001  . Smokeless tobacco: Former Systems developer    Types: Loss adjuster, chartered  Comment: Quit in 2003  . Alcohol Use: No  . Drug Use: No  . Sexual Activity: Not Currently   Other Topics Concern  . None   Social History Narrative     Review of Systems: A 12 point ROS discussed and pertinent positives are indicated in the HPI above.  All other systems are negative.  Review of Systems  Constitutional: Negative for fever, activity change, appetite change and fatigue.  Respiratory: Negative for shortness of breath.   Cardiovascular: Negative for leg swelling.  Skin: Positive for wound.  Neurological: Negative for weakness.  Psychiatric/Behavioral: Negative for behavioral problems and confusion.    Vital Signs: BP 149/54 mmHg  Pulse 70  Temp(Src) 98.2 F (36.8 C) (Oral)  Resp 16  Ht 5\' 10"  (1.778 m)  Wt 250 lb (113.399 kg)  BMI 35.87 kg/m2  SpO2 93%  Physical Exam  Constitutional: He is oriented to person, place, and time.  Cardiovascular: Normal rate, regular rhythm and normal heart sounds.   Pulmonary/Chest: Effort normal and breath sounds normal. He has no wheezes.  Abdominal: Soft. Bowel sounds are normal. There is no tenderness.  Musculoskeletal: Normal range of motion. He exhibits tenderness.  Rt great toe ulcer  Neurological: He is  alert and oriented to person, place, and time.  Skin: Skin is warm and dry.  Psychiatric: He has a normal mood and affect. His behavior is normal. Judgment and thought content normal.  Nursing note and vitals reviewed.   Mallampati Score:  MD Evaluation Airway: WNL Heart: WNL Abdomen: WNL Chest/ Lungs: WNL ASA  Classification: 3 Mallampati/Airway Score: One  Imaging: No results found.  Labs:  CBC:  Recent Labs  04/23/15 1000 09/18/15 0656  WBC 7.0 6.4  HGB 13.8 14.7  HCT 41.5 41.6  PLT 160 159    COAGS:  Recent Labs  04/23/15 1000 09/18/15 0656  INR 1.07 1.09  APTT  --  31    BMP:  Recent Labs  03/18/15 0831 04/23/15 1000 06/29/15 1010 09/18/15 0656  NA  --  138 141 139  K  --  4.7 4.3 4.7  CL  --  103 100 102  CO2  --  26 28 26   GLUCOSE  --  277* 192* 349*  BUN 22 21* 23 16  CALCIUM  --  9.6 9.7 9.4  CREATININE 1.35* 1.35* 1.38* 1.55*  GFRNONAA 54* 53*  --  45*  GFRAA 63 >60  --  52*    LIVER FUNCTION TESTS:  Recent Labs  06/29/15 1010  BILITOT 0.4  AST 32  ALT 50*  ALKPHOS 50  PROT 6.6  ALBUMIN 4.2    TUMOR MARKERS: No results for input(s): AFPTM, CEA, CA199, CHROMGRNA in the last 8760 hours.  Assessment and Plan:  Rutherford Stage 5 critical limb ischemia Rt toe ulcer Previous Rt superficial femoral artery recanalization 04/23/15 Toe ulcer still apparent---healing slowly Scheduled for RLE angiogram and revascularization of the PT and AT with orbital atherectomy and PTA. May require arterial access both at the groin, and the right foot Risks and Benefits discussed with the patient including, but not limited to bleeding, infection, vascular injury or contrast induced renal failure. All of the patient's questions were answered, patient is agreeable to proceed. Consent signed and in chart.  Thank you for this interesting consult.  I greatly enjoyed meeting Bryse I Carlyon and look forward to participating in their care.  A copy  of this report was sent to the requesting provider on this  date.  Electronically Signed: Rowan Blaker A 10/07/2015, 7:53 AM   I spent a total of  30 Minutes   in face to face in clinical consultation, greater than 50% of which was counseling/coordinating care for RLE arteriogram with poss intervention

## 2015-10-07 NOTE — Procedures (Signed)
Interventional Radiology Procedure Note  Procedure:   1.) US guided puncture x3 2.) RLE angiogram 3.) Atherectomy and PTA femoral and popliteal lesions 4.) Atherectomy and PTA peroneal lesion 5.) Unsuccessful recanalization of the AT and PT.   Complications: None  Estimated Blood Loss: <25 mL  Recommendations: - Bedrest x 4 hrs   Signed,  Criselda Peaches, MD

## 2015-10-07 NOTE — Sedation Documentation (Signed)
Alma Downs, RN in room to continue monitoring pt and sedation

## 2015-10-07 NOTE — Discharge Instructions (Signed)
Angiogram, Care After °Refer to this sheet in the next few weeks. These instructions provide you with information about caring for yourself after your procedure. Your health care provider may also give you more specific instructions. Your treatment has been planned according to current medical practices, but problems sometimes occur. Call your health care provider if you have any problems or questions after your procedure. °WHAT TO EXPECT AFTER THE PROCEDURE °After your procedure, it is typical to have the following: °· Bruising at the catheter insertion site that usually fades within 1-2 weeks. °· Blood collecting in the tissue (hematoma) that may be painful to the touch. It should usually decrease in size and tenderness within 1-2 weeks. °HOME CARE INSTRUCTIONS °· Take medicines only as directed by your health care provider. °· You may shower 24-48 hours after the procedure or as directed by your health care provider. Remove the bandage (dressing) and gently wash the site with plain soap and water. Pat the area dry with a clean towel. Do not rub the site, because this may cause bleeding. °· Do not take baths, swim, or use a hot tub until your health care provider approves. °· Check your insertion site every day for redness, swelling, or drainage. °· Do not apply powder or lotion to the site. °· Do not lift over 10 lb (4.5 kg) for 5 days after your procedure or as directed by your health care provider. °· Ask your health care provider when it is okay to: °¨ Return to work or school. °¨ Resume usual physical activities or sports. °¨ Resume sexual activity. °· Do not drive home if you are discharged the same day as the procedure. Have someone else drive you. °· You may drive 24 hours after the procedure unless otherwise instructed by your health care provider. °· Do not operate machinery or power tools for 24 hours after the procedure or as directed by your health care provider. °· If your procedure was done as an  outpatient procedure, which means that you went home the same day as your procedure, a responsible adult should be with you for the first 24 hours after you arrive home. °· Keep all follow-up visits as directed by your health care provider. This is important. °SEEK MEDICAL CARE IF: °· You have a fever. °· You have chills. °· You have increased bleeding from the catheter insertion site. Hold pressure on the site. °SEEK IMMEDIATE MEDICAL CARE IF: °· You have unusual pain at the catheter insertion site. °· You have redness, warmth, or swelling at the catheter insertion site. °· You have drainage (other than a small amount of blood on the dressing) from the catheter insertion site. °· The catheter insertion site is bleeding, and the bleeding does not stop after 30 minutes of holding steady pressure on the site. °· The area near or just beyond the catheter insertion site becomes pale, cool, tingly, or numb. °  °This information is not intended to replace advice given to you by your health care provider. Make sure you discuss any questions you have with your health care provider. °  °Document Released: 02/17/2005 Document Revised: 08/22/2014 Document Reviewed: 01/02/2013 °Elsevier Interactive Patient Education ©2016 Elsevier Inc. ° °

## 2015-10-08 ENCOUNTER — Ambulatory Visit (HOSPITAL_COMMUNITY): Payer: Medicare Other | Admitting: Physical Therapy

## 2015-10-08 ENCOUNTER — Other Ambulatory Visit (HOSPITAL_COMMUNITY): Payer: Self-pay | Admitting: Interventional Radiology

## 2015-10-08 DIAGNOSIS — R2681 Unsteadiness on feet: Secondary | ICD-10-CM | POA: Diagnosis not present

## 2015-10-08 DIAGNOSIS — I739 Peripheral vascular disease, unspecified: Secondary | ICD-10-CM

## 2015-10-08 DIAGNOSIS — R262 Difficulty in walking, not elsewhere classified: Secondary | ICD-10-CM

## 2015-10-08 DIAGNOSIS — S91109D Unspecified open wound of unspecified toe(s) without damage to nail, subsequent encounter: Secondary | ICD-10-CM

## 2015-10-08 NOTE — Therapy (Signed)
[place], [name], [address] Phone: [phone]   Fax:  [phone]  Wound Care Therapy  Patient Details  Name: [name] MRN: [id number] Date of Birth: Feb 28, 1946 No Data Recorded  Encounter Date: 10/08/2015      PT End of Session - 10/08/15 1606    Visit Number 71   Number of Visits 77   Date for PT Re-Evaluation 10/25/15   Authorization Type [employer] Medicare: Gcode and reassessment complete visit 21   Authorization - Visit Number 26   Authorization - Number of Visits 70   PT Start Time 1350   PT Stop Time 1420   PT Time Calculation (min) 30 min   Activity Tolerance Patient tolerated treatment well   Behavior During Therapy [facility] for tasks assessed/performed      Past Medical History  Diagnosis Date  . Hypertension   . Diabetes mellitus     type 2   . History of valve replacement 2010    [facility] Ease bioprosthetic 45mm  . Thyroid disease   . Stroke [facility]) 2003    R-sided weakness & upper extremity swelling   . Coronary artery disease   . Dyslipidemia   . At risk for sudden cardiac death June 21, 2013  . PAF (paroxysmal atrial fibrillation) ([place]) 05/2013    converted with amiodarone to SR  . Chronic anticoagulation 05/2013    started  . S/P cardiac catheterization, Rt & Lt heart cath 06/05/2013 2013-06-21  . S/P CABG x 3 2010  . GERD (gastroesophageal reflux disease)   . Cataract   . Chronic kidney disease     Patient reports he is seeing Kidney doctor in September    Past Surgical History  Procedure Laterality Date  . Coronary artery bypass graft  01/26/2009    LIMA to LAD, SVG to circumflex, SVG to PDA  . Aortic valve replacement  01/26/2009    [facility] Ease bioprosthetic 5mm valve  . Transthoracic echocardiogram  09/2011    grade 1 diastolic dysfunction; increasing valve gradient; calcified MV annulus   . Cardiac catheterization  06/05/2013    native 3 vessel disease; patent  LIMA to LAD, SVG to OM and SVG to PDA; Elevated right and left heart filling pressures, with predominantly pulmonary venous hypertension, normal PVR  . Transthoracic echocardiogram  06/03/2013    EF 25-30%, mod conc. hypertrophy, grade 3 diastolic dysfunction; LA mod dilated; calcified MV annulus; transaortic gradients are normal for the bioprosthetic valve; inf vena cava dilated (elevated CVP) - LifeVest  . Left and right heart catheterization with coronary angiogram N/A 06/05/2013    Procedure: LEFT AND RIGHT HEART CATHETERIZATION WITH CORONARY ANGIOGRAM;  Surgeon: Leonie Man, MD;  Location: [facility] CATH LAB;  Service: Cardiovascular;  Laterality: N/A;  . Graft(s) angiogram  06/05/2013    Procedure: GRAFT(S) Cyril Loosen;  Surgeon: Leonie Man, MD;  Location: [facility] CATH LAB;  Service: Cardiovascular;;  . Colonoscopy  2007    Dr. Aviva Signs: internal hemorrhoids    There were no vitals filed for this visit.  Visit Diagnosis:  Open toe wound, subsequent encounter  Difficulty walking  Unsteadiness                 Wound Therapy - 10/08/15 1600    Subjective Pt had an angioplasty yesterday for his AT/PT arteries.  States they did not do the stents but used the balloon procedure.  Pt reports he is sore today but having  no pain.    Pain Assessment No/denies pain   Wound Properties Date First Assessed: 06/23/15 Time First Assessed: 0900 Wound Type: Other (Comment) Location: Toe (Comment  which one) Location Orientation: Right Wound Description (Comments): medial  aspect of Rt great toe Present on Admission: No   Dressing Type Gauze (Comment);Silver hydrofiber   Dressing Status Intact;Old drainage   Dressing Change Frequency Every 3 days   Site / Wound Assessment Yellow;Granulation tissue;Pale   % Wound base Red or Granulating 35%   % Wound base Yellow 65%   Margins Unattached edges (unapproximated)   Drainage Amount Minimal   Drainage Description Serosanguineous;No odor    Treatment Cleansed;Debridement (Selective)   Incision Properties Date First Assessed: 04/23/15 Time First Assessed: 1738 Location: Groin Location Orientation: Left Present on Admission: No   Selective Debridement - Location wound bed and perimeter of wound   Selective Debridement - Tools Used Forceps;Scissors   Selective Debridement - Tissue Removed slough and callous    Wound Therapy - Clinical Statement Overall improved perfusion noted in toes/foot; improved color and warmth as compared to last session.  Edges also appear cleaner and with less undermining.  Continued with silver hydrofiber and gauze.     Wound Therapy - Functional Problem List Non-healing wound  affecting gait.    Factors Delaying/Impairing Wound Healing Altered sensation;Diabetes Mellitus;Infection - systemic/local;Multiple medical problems;Polypharmacy;Vascular compromise   Hydrotherapy Plan Debridement;Dressing change   Wound Therapy - Current Recommendations PT   Wound Plan Pt will benefit from continued skilled physical therapy to prevent infection and improve wound bed for a healing environment.    Dressing  vaseline around periphery.  Silverhydrofiber was placed in wound bed itself.;gauze and kerlix    Decrease Necrotic Tissue to 20%   Increase Granulation Tissue to 80%   Decrease Length/Width/Depth by (cm) 1.0   Improve Drainage Characteristics Min   Patient/Family will be able to  Independent in dressing change   Additional Wound Therapy Goal full healing of wound                              Problem List Patient Active Problem List   Diagnosis Date Noted  . Rectal bleeding 07/28/2015  . Chronic anticoagulation 07/28/2015  . PVD (peripheral vascular disease) ([place])   . S/P angioplasty 04/23/2015  . Peripheral arterial occlusive disease ([place]) 04/23/2015  . PAD (peripheral artery disease) ([place])   . Critical lower limb ischemia   . Toe ulcer, right ([place])   . Type II or unspecified type  diabetes mellitus without mention of complication, uncontrolled 11/27/2013  . Acute respiratory failure with hypoxia ([place]) 11/26/2013  . CAP (community acquired pneumonia) 11/26/2013  . CHF exacerbation ([place]) 11/25/2013  . Cardiomyopathy, ischemic 06/13/2013  . At risk for sudden cardiac death 06/23/2013  . S/P cardiac catheterization, Rt & Lt heart cath 06/05/2013 06-23-2013  . Acute on chronic combined systolic and diastolic HF (heart failure), NYHA class 3 ([place]) 06/04/2013  . Atrial flutter with rapid ventricular response ([place]) 06/02/2013  . Acute renal failure: Cr up to 1.8 06/02/2013  . Hyperkalemia 06/02/2013    Class: Acute  . LBBB (left bundle branch block) 06/01/2013  . Hypotension- secondary to AF and Diltiazem Rx 06/01/2013  . Obesity (BMI 30-39.9) 06/01/2013  . Acute on chronic diastolic heart failure - due to Afib AVR 06/01/2013  . Atrial fibrillation with RVR- converted with Amiodarone 05/31/2013  . Stroke- Lt brain 2003 04/08/2013  .  Hemiplegia affecting right dominant side ([place]) 04/08/2013  . Cataract 04/08/2013  . GERD (gastroesophageal reflux disease) 04/08/2013  . S/P CABG x 3 - 2010 04/08/2013  . S/P AVR-tissue, 2010 04/08/2013  . DM2 (diabetes mellitus, type 2) ([place]) 04/08/2013  . Dyslipidemia 04/08/2013  . SUBACROMIAL BURSITIS, LEFT 11/05/2009    Teena Irani, PTA/CLT [phone]  10/08/2015, 4:07 PM  [place] [address] [place], [name], [address] Phone: [phone]   Fax:  [phone]  Name: [name] MRN: [id number] Date of Birth: [birth year]

## 2015-10-13 ENCOUNTER — Ambulatory Visit (HOSPITAL_COMMUNITY): Payer: Medicare Other | Admitting: Physical Therapy

## 2015-10-13 DIAGNOSIS — R262 Difficulty in walking, not elsewhere classified: Secondary | ICD-10-CM | POA: Diagnosis not present

## 2015-10-13 DIAGNOSIS — R2681 Unsteadiness on feet: Secondary | ICD-10-CM | POA: Diagnosis not present

## 2015-10-13 DIAGNOSIS — S91109D Unspecified open wound of unspecified toe(s) without damage to nail, subsequent encounter: Secondary | ICD-10-CM | POA: Diagnosis not present

## 2015-10-13 NOTE — Therapy (Signed)
[place], [name], [address] Phone: [phone]   Fax:  [phone]  Wound Care Therapy  Patient Details  Name: [name] MRN: [id number] Date of Birth: 03-19-1946 No Data Recorded  Encounter Date: 10/13/2015      PT End of Session - 10/13/15 1636    Visit Number 48   Number of Visits 51   Date for PT Re-Evaluation 10/25/15   Authorization Type UHC Medicare: Gcode and reassessment complete visit 72   Authorization - Visit Number 35   Authorization - Number of Visits 56   PT Start Time [id number]   PT Stop Time 1435   PT Time Calculation (min) 40 min   Activity Tolerance Patient tolerated treatment well   Behavior During Therapy [facility] for tasks assessed/performed      Past Medical History  Diagnosis Date  . Hypertension   . Diabetes mellitus     type 2   . History of valve replacement 2010    [facility] Ease bioprosthetic 32mm  . Thyroid disease   . Stroke [facility]) 2003    R-sided weakness & upper extremity swelling   . Coronary artery disease   . Dyslipidemia   . At risk for sudden cardiac death 2013/06/14  . PAF (paroxysmal atrial fibrillation) ([place]) 05/2013    converted with amiodarone to SR  . Chronic anticoagulation 05/2013    started  . S/P cardiac catheterization, Rt & Lt heart cath 06/05/2013 06/14/2013  . S/P CABG x 3 2010  . GERD (gastroesophageal reflux disease)   . Cataract   . Chronic kidney disease     Patient reports he is seeing Kidney doctor in September    Past Surgical History  Procedure Laterality Date  . Coronary artery bypass graft  01/26/2009    LIMA to LAD, SVG to circumflex, SVG to PDA  . Aortic valve replacement  01/26/2009    [facility] Ease bioprosthetic 43mm valve  . Transthoracic echocardiogram  09/2011    grade 1 diastolic dysfunction; increasing valve gradient; calcified MV annulus   . Cardiac catheterization  06/05/2013    native 3 vessel disease; patent  LIMA to LAD, SVG to OM and SVG to PDA; Elevated right and left heart filling pressures, with predominantly pulmonary venous hypertension, normal PVR  . Transthoracic echocardiogram  06/03/2013    EF 25-30%, mod conc. hypertrophy, grade 3 diastolic dysfunction; LA mod dilated; calcified MV annulus; transaortic gradients are normal for the bioprosthetic valve; inf vena cava dilated (elevated CVP) - LifeVest  . Left and right heart catheterization with coronary angiogram N/A 06/05/2013    Procedure: LEFT AND RIGHT HEART CATHETERIZATION WITH CORONARY ANGIOGRAM;  Surgeon: Leonie Man, MD;  Location: [facility] CATH LAB;  Service: Cardiovascular;  Laterality: N/A;  . Graft(s) angiogram  06/05/2013    Procedure: GRAFT(S) Cyril Loosen;  Surgeon: Leonie Man, MD;  Location: [facility] CATH LAB;  Service: Cardiovascular;;  . Colonoscopy  2007    Dr. Aviva Signs: internal hemorrhoids    There were no vitals filed for this visit.  Visit Diagnosis:  Open toe wound, subsequent encounter  Difficulty walking  Unsteadiness                 Wound Therapy - 10/13/15 1631    Subjective Pt states hes been having some burning sensations around his ankle and his toe has been itching.  No pain.   Pain Assessment No/denies pain   Wound Properties Date  First Assessed: 06/23/15 Time First Assessed: 0900 Wound Type: Other (Comment) Location: Toe (Comment  which one) Location Orientation: Right Wound Description (Comments): medial  aspect of Rt great toe Present on Admission: No   Dressing Type Gauze (Comment);Silver hydrofiber   Dressing Changed Changed   Dressing Status Intact;New drainage   Dressing Change Frequency Every 3 days   Site / Wound Assessment Yellow;Granulation tissue;Pale   % Wound base Red or Granulating 40%   % Wound base Yellow 60%   Margins Attached edges (approximated)  partial attachment.  some areas still unattached   Drainage Amount Moderate   Drainage Description Serosanguineous    Treatment Debridement (Selective);Cleansed   Incision Properties Date First Assessed: 04/23/15 Time First Assessed: 1738 Location: Groin Location Orientation: Left Present on Admission: No   Selective Debridement - Location wound bed and perimeter of wound   Selective Debridement - Tools Used Forceps;Scissors   Selective Debridement - Tissue Removed slough and callous    Wound Therapy - Clinical Statement Continued improvement of wound with increased granulation and overall appearance of wound perimeter.  Removed more undermined edges with some approximation around superior wound borders.  Added a thin layer of xeroform to increase moisture of wound edges and covered with hyrdrofiber to absorb excess drainage.  Moisturized border with vaseline prior to redressing.     Hydrotherapy Plan Debridement;Dressing change   Wound Plan Pt will benefit from continued skilled physical therapy to prevent infection and improve wound bed for a healing environment.    Dressing  vaseline around periphery.  Silverhydrofiber was placed in wound bed itself.;gauze and kerlix                               Problem List Patient Active Problem List   Diagnosis Date Noted  . Rectal bleeding 07/28/2015  . Chronic anticoagulation 07/28/2015  . PVD (peripheral vascular disease) ([place])   . S/P angioplasty 04/23/2015  . Peripheral arterial occlusive disease ([place]) 04/23/2015  . PAD (peripheral artery disease) ([place])   . Critical lower limb ischemia   . Toe ulcer, right ([place])   . Type II or unspecified type diabetes mellitus without mention of complication, uncontrolled 11/27/2013  . Acute respiratory failure with hypoxia ([place]) 11/26/2013  . CAP (community acquired pneumonia) 11/26/2013  . CHF exacerbation ([place]) 11/25/2013  . Cardiomyopathy, ischemic 06/13/2013  . At risk for sudden cardiac death Jun 21, 2013  . S/P cardiac catheterization, Rt & Lt heart cath 06/05/2013 21-Jun-2013  . Acute on  chronic combined systolic and diastolic HF (heart failure), NYHA class 3 ([place]) 06/04/2013  . Atrial flutter with rapid ventricular response ([place]) 06/02/2013  . Acute renal failure: Cr up to 1.8 06/02/2013  . Hyperkalemia 06/02/2013    Class: Acute  . LBBB (left bundle branch block) 06/01/2013  . Hypotension- secondary to AF and Diltiazem Rx 06/01/2013  . Obesity (BMI 30-39.9) 06/01/2013  . Acute on chronic diastolic heart failure - due to Afib AVR 06/01/2013  . Atrial fibrillation with RVR- converted with Amiodarone 05/31/2013  . Stroke- Lt brain 2003 04/08/2013  . Hemiplegia affecting right dominant side ([place]) 04/08/2013  . Cataract 04/08/2013  . GERD (gastroesophageal reflux disease) 04/08/2013  . S/P CABG x 3 - 2010 04/08/2013  . S/P AVR-tissue, 2010 04/08/2013  . DM2 (diabetes mellitus, type 2) ([place]) 04/08/2013  . Dyslipidemia 04/08/2013  . SUBACROMIAL BURSITIS, LEFT 11/05/2009    Teena Irani, PTA/CLT [phone]  10/13/2015, 4:38  PM  [place], [name], [address] Phone: [phone]   Fax:  [phone]  Name: [name] MRN: [id number] Date of Birth: 28-Oct-1945

## 2015-10-15 ENCOUNTER — Ambulatory Visit (HOSPITAL_COMMUNITY): Payer: Medicare Other | Attending: Orthopaedic Surgery

## 2015-10-15 DIAGNOSIS — R2681 Unsteadiness on feet: Secondary | ICD-10-CM

## 2015-10-15 DIAGNOSIS — S91109D Unspecified open wound of unspecified toe(s) without damage to nail, subsequent encounter: Secondary | ICD-10-CM | POA: Diagnosis not present

## 2015-10-15 DIAGNOSIS — R262 Difficulty in walking, not elsewhere classified: Secondary | ICD-10-CM | POA: Diagnosis not present

## 2015-10-15 NOTE — Therapy (Signed)
[place], [name], [address] Phone: [phone]   Fax:  [phone]  Wound Care Therapy  Patient Details  Name: [name] MRN: [id number] Date of Birth: 1945-11-27 No Data Recorded  Encounter Date: 10/15/2015      PT End of Session - 10/15/15 1418    Visit Number 40   Number of Visits 36   Date for PT Re-Evaluation 10/25/15   Authorization Type UHC Medicare: Gcode and reassessment complete visit 89   Authorization Time Period 04/22/15 to 06/21/05   Authorization - Visit Number 73   Authorization - Number of Visits 32   PT Start Time 0932   PT Stop Time 1010   PT Time Calculation (min) 38 min   Activity Tolerance Patient tolerated treatment well   Behavior During Therapy [facility] for tasks assessed/performed      Past Medical History  Diagnosis Date  . Hypertension   . Diabetes mellitus     type 2   . History of valve replacement 2010    [facility] Ease bioprosthetic 42mm  . Thyroid disease   . Stroke [facility]) 2003    R-sided weakness & upper extremity swelling   . Coronary artery disease   . Dyslipidemia   . At risk for sudden cardiac death 11-Jun-2013  . PAF (paroxysmal atrial fibrillation) ([place]) 05/2013    converted with amiodarone to SR  . Chronic anticoagulation 05/2013    started  . S/P cardiac catheterization, Rt & Lt heart cath 06/05/2013 06/11/2013  . S/P CABG x 3 2010  . GERD (gastroesophageal reflux disease)   . Cataract   . Chronic kidney disease     Patient reports he is seeing Kidney doctor in September    Past Surgical History  Procedure Laterality Date  . Coronary artery bypass graft  01/26/2009    LIMA to LAD, SVG to circumflex, SVG to PDA  . Aortic valve replacement  01/26/2009    [facility] Ease bioprosthetic 23mm valve  . Transthoracic echocardiogram  09/2011    grade 1 diastolic dysfunction; increasing valve gradient; calcified MV annulus   . Cardiac catheterization   06/05/2013    native 3 vessel disease; patent LIMA to LAD, SVG to OM and SVG to PDA; Elevated right and left heart filling pressures, with predominantly pulmonary venous hypertension, normal PVR  . Transthoracic echocardiogram  06/03/2013    EF 25-30%, mod conc. hypertrophy, grade 3 diastolic dysfunction; LA mod dilated; calcified MV annulus; transaortic gradients are normal for the bioprosthetic valve; inf vena cava dilated (elevated CVP) - LifeVest  . Left and right heart catheterization with coronary angiogram N/A 06/05/2013    Procedure: LEFT AND RIGHT HEART CATHETERIZATION WITH CORONARY ANGIOGRAM;  Surgeon: Leonie Man, MD;  Location: [facility] CATH LAB;  Service: Cardiovascular;  Laterality: N/A;  . Graft(s) angiogram  06/05/2013    Procedure: GRAFT(S) Cyril Loosen;  Surgeon: Leonie Man, MD;  Location: [facility] CATH LAB;  Service: Cardiovascular;;  . Colonoscopy  2007    Dr. Aviva Signs: internal hemorrhoids    There were no vitals filed for this visit.  Visit Diagnosis:  Open toe wound, subsequent encounter  Difficulty walking  Unsteadiness      Subjective Assessment - 10/15/15 1410    Subjective No reports of pain today, dressings intact   Pertinent History Patient reports that this wound started as a small little brown spot on the end of his toe that just kept getting bigger;  he thinks it started about 8 months ago. He reports that it is now slowly getting better but has been healing slow. He states  that it has been getting better.    Currently in Pain? No/denies                   Wound Therapy - 10/15/15 1411    Subjective No reports of pain today, dressings intact   Pain Assessment No/denies pain   Wound Properties Date First Assessed: 06/23/15 Time First Assessed: 0900 Wound Type: Other (Comment) Location: Toe (Comment  which one) Location Orientation: Right Wound Description (Comments): medial  aspect of Rt great toe Present on Admission: No   Dressing Type  Impregnated gauze (petrolatum);Gauze (Comment)   Dressing Changed Changed   Dressing Status Intact;New drainage   Dressing Change Frequency Every 3 days   Site / Wound Assessment Yellow;Granulation tissue;Pale   % Wound base Red or Granulating 45%   % Wound base Yellow 55%   Margins Attached edges (approximated)  Partial attachement, some areas still not attached   Drainage Amount Minimal   Drainage Description Serosanguineous   Treatment Cleansed;Debridement (Selective)   Incision Properties Date First Assessed: 04/23/15 Time First Assessed: 1738 Location: Groin Location Orientation: Left Present on Admission: No   Selective Debridement - Location wound bed and perimeter of wound   Selective Debridement - Tools Used Forceps;Scalpel   Selective Debridement - Tissue Removed slough and callous    Wound Therapy - Clinical Statement Wound with improved approximation noted this session and increased granulation tissue following debridment.  Reduced slough this session.  Following debridement for removal of slough and dry skin perimeter of wound to promote healing.  Xeroform applied to wound base to increase moisture with gauze and netting.  No reports of pain through sessoin.     Wound Therapy - Functional Problem List Non-healing wound  affecting gait.    Factors Delaying/Impairing Wound Healing Altered sensation;Diabetes Mellitus;Infection - systemic/local;Multiple medical problems;Polypharmacy;Vascular compromise   Hydrotherapy Plan Debridement;Dressing change   Wound Therapy - Current Recommendations PT   Wound Plan Pt will benefit from continued skilled physical therapy to prevent infection and improve wound bed for a healing environment.    Dressing  vaseline around periphery.  Xeroform was placed in wound bed itself.;gauze and kerlix             Problem List Patient Active Problem List   Diagnosis Date Noted  . Rectal bleeding 07/28/2015  . Chronic anticoagulation 07/28/2015  .  PVD (peripheral vascular disease) ([place])   . S/P angioplasty 04/23/2015  . Peripheral arterial occlusive disease ([place]) 04/23/2015  . PAD (peripheral artery disease) ([place])   . Critical lower limb ischemia   . Toe ulcer, right ([place])   . Type II or unspecified type diabetes mellitus without mention of complication, uncontrolled 11/27/2013  . Acute respiratory failure with hypoxia ([place]) 11/26/2013  . CAP (community acquired pneumonia) 11/26/2013  . CHF exacerbation ([place]) 11/25/2013  . Cardiomyopathy, ischemic 06/13/2013  . At risk for sudden cardiac death 06-11-13  . S/P cardiac catheterization, Rt & Lt heart cath 06/05/2013 Jun 11, 2013  . Acute on chronic combined systolic and diastolic HF (heart failure), NYHA class 3 ([place]) 06/04/2013  . Atrial flutter with rapid ventricular response ([place]) 06/02/2013  . Acute renal failure: Cr up to 1.8 06/02/2013  . Hyperkalemia 06/02/2013    Class: Acute  . LBBB (left bundle branch block) 06/01/2013  . Hypotension- secondary to AF and Diltiazem Rx 06/01/2013  .  Obesity (BMI 30-39.9) 06/01/2013  . Acute on chronic diastolic heart failure - due to Afib AVR 06/01/2013  . Atrial fibrillation with RVR- converted with Amiodarone 05/31/2013  . Stroke- Lt brain 2003 04/08/2013  . Hemiplegia affecting right dominant side ([place]) 04/08/2013  . Cataract 04/08/2013  . GERD (gastroesophageal reflux disease) 04/08/2013  . S/P CABG x 3 - 2010 04/08/2013  . S/P AVR-tissue, 2010 04/08/2013  . DM2 (diabetes mellitus, type 2) ([place]) 04/08/2013  . Dyslipidemia 04/08/2013  . SUBACROMIAL BURSITIS, LEFT 11/05/2009   Ihor Austin, LPTA; CBIS [phone]  Aldona Lento 10/15/2015, 2:20 PM  [place], [name], [address] Phone: [phone]   Fax:  [phone]  Name: [name] MRN: [id number] Date of Birth: 11/17/1945

## 2015-10-19 ENCOUNTER — Other Ambulatory Visit: Payer: Self-pay | Admitting: Licensed Clinical Social Worker

## 2015-10-19 NOTE — Patient Outreach (Signed)
Assessment:  CSW called client on 10/19/15 and spoke via phone with client. CSW verified client identity. CSW and client spoke of client needs. Client said he has his medications and is taking medications as prescribed. He is attending scheduled medical appointments. Client said he is still going to wound center in [place], [name] for wound care to his  Right foot. CSW and client spoke of client care plan. CSW encouraged client to continue to go to scheduled medical appointments for client in next 30 days.  Client goes to wound care center 2 times weekly for wound dressing care.  He said he has adequate food supply.  He said he has been more sleepy lately.  CSW encouraged client to talk with Dr. Gerarda Fraction about medication side affects.  Client said he is interested in changing residence locations. He said he would like to move back to [place], [name]. He is using heaters to help him manage cold temperatures.  He visits with his niece and other relatives for socialization.  CSW encouraged client to continue to socialize with family members as a form of relaxation. He drives himself to scheduled medical appointments. He has follow up appointment with surgeon in [place], [name] in March of 2017. His niece will help transport client to and from appointment with surgeon in March of 2017. He still has some pain issues in his right foot. He has talked with Dr .Gerarda Fraction about these foot pain issues. He said he takes prescribed pain medications as ordered. CSW again encouraged client to attend all his appointments at local wound care center; CSW encouraged client to attend all scheduled client medical appointments in next 30 days.    Plan: Client to attend all scheduled client appointments at local wound care center in next 30 days. Client to attend all scheduled client medical appointments in next 30 days CSW to call client in 4 weeks to assess client needs.  Norva Riffle.Mekaela Azizi MSW, LCSW Licensed Clinical Social Worker [facility]  Care Management [phone]

## 2015-10-20 ENCOUNTER — Ambulatory Visit (HOSPITAL_COMMUNITY): Payer: Medicare Other

## 2015-10-20 DIAGNOSIS — R2681 Unsteadiness on feet: Secondary | ICD-10-CM

## 2015-10-20 DIAGNOSIS — R262 Difficulty in walking, not elsewhere classified: Secondary | ICD-10-CM | POA: Diagnosis not present

## 2015-10-20 DIAGNOSIS — S91109D Unspecified open wound of unspecified toe(s) without damage to nail, subsequent encounter: Secondary | ICD-10-CM

## 2015-10-20 NOTE — Therapy (Signed)
[place], [name], [address] Phone: [phone]   Fax:  [phone]  Physical Therapy Treatment  Patient Details  Name: [name] MRN: [id number] Date of Birth: 1945/12/24 No Data Recorded  Encounter Date: 10/20/2015      PT End of Session - 10/20/15 1009    Visit Number 50   Number of Visits 51   Date for PT Re-Evaluation 10/25/15   Authorization Type UHC Medicare: Gcode and reassessment complete visit 82   Authorization Time Period 04/22/15 to 06/21/05   Authorization - Visit Number 54   Authorization - Number of Visits 69   PT Start Time 0933   PT Stop Time 1011   PT Time Calculation (min) 38 min   Activity Tolerance Patient tolerated treatment well   Behavior During Therapy [facility] for tasks assessed/performed      Past Medical History  Diagnosis Date  . Hypertension   . Diabetes mellitus     type 2   . History of valve replacement 2010    [facility] Ease bioprosthetic 50mm  . Thyroid disease   . Stroke [facility]) 2003    R-sided weakness & upper extremity swelling   . Coronary artery disease   . Dyslipidemia   . At risk for sudden cardiac death 07/01/13  . PAF (paroxysmal atrial fibrillation) ([place]) 05/2013    converted with amiodarone to SR  . Chronic anticoagulation 05/2013    started  . S/P cardiac catheterization, Rt & Lt heart cath 06/05/2013 07/01/13  . S/P CABG x 3 2010  . GERD (gastroesophageal reflux disease)   . Cataract   . Chronic kidney disease     Patient reports he is seeing Kidney doctor in September    Past Surgical History  Procedure Laterality Date  . Coronary artery bypass graft  01/26/2009    LIMA to LAD, SVG to circumflex, SVG to PDA  . Aortic valve replacement  01/26/2009    [facility] Ease bioprosthetic 48mm valve  . Transthoracic echocardiogram  09/2011    grade 1 diastolic dysfunction; increasing valve gradient; calcified MV annulus   . Cardiac  catheterization  06/05/2013    native 3 vessel disease; patent LIMA to LAD, SVG to OM and SVG to PDA; Elevated right and left heart filling pressures, with predominantly pulmonary venous hypertension, normal PVR  . Transthoracic echocardiogram  06/03/2013    EF 25-30%, mod conc. hypertrophy, grade 3 diastolic dysfunction; LA mod dilated; calcified MV annulus; transaortic gradients are normal for the bioprosthetic valve; inf vena cava dilated (elevated CVP) - LifeVest  . Left and right heart catheterization with coronary angiogram N/A 06/05/2013    Procedure: LEFT AND RIGHT HEART CATHETERIZATION WITH CORONARY ANGIOGRAM;  Surgeon: Leonie Man, MD;  Location: [facility] LAB;  Service: Cardiovascular;  Laterality: N/A;  . Graft(s) angiogram  06/05/2013    Procedure: GRAFT(S) Cyril Loosen;  Surgeon: Leonie Man, MD;  Location: [facility] CATH LAB;  Service: Cardiovascular;;  . Colonoscopy  2007    Dr. Aviva Signs: internal hemorrhoids    There were no vitals filed for this visit.  Visit Diagnosis:  Open toe wound, subsequent encounter  Difficulty walking  Unsteadiness      Subjective Assessment - 10/20/15 1215    Subjective Pt stated he has had increased pain on toe, stated he feels as though the feeling are coming back to his toe.  Currently pain free, dressings intact   Pertinent History Patient reports  that this wound started as a small little brown spot on the end of his toe that just kept getting bigger; he thinks it started about 8 months ago. He reports that it is now slowly getting better but has been healing slow. He states  that it has been getting better.    Currently in Pain? No/denies                       Wound Therapy - 10/20/15 1217    Subjective Pt stated he has had increased pain on toe, stated he feels as though the feeling are coming back to his toe.  Currently pain free, dressings intact   Pain Assessment No/denies pain   Wound Properties Date First Assessed:  06/23/15 Time First Assessed: 0900 Wound Type: Other (Comment) Location: Toe (Comment  which one) Location Orientation: Right Wound Description (Comments): medial  aspect of Rt great toe Present on Admission: No   Dressing Type Silver hydrofiber;Impregnated gauze (petrolatum);Gauze (Comment)   Dressing Changed Changed   Dressing Status Intact;New drainage   Dressing Change Frequency Every 3 days   Site / Wound Assessment Yellow;Granulation tissue;Pale   % Wound base Red or Granulating 50%   % Wound base Yellow 50%   Margins Attached edges (approximated)  partial approximation, some areas not yet   Drainage Amount Minimal   Drainage Description Serosanguineous   Treatment Cleansed;Debridement (Selective)   Incision Properties Date First Assessed: 04/23/15 Time First Assessed: 1738 Location: Groin Location Orientation: Left Present on Admission: No   Selective Debridement - Location wound bed and perimeter of wound   Selective Debridement - Tools Used Forceps;Scalpel   Selective Debridement - Tissue Removed slough and callous    Wound Therapy - Clinical Statement Wound continues to improve approximation though does continue to have some areas that have not approximated still.  Increased ease for removal of slough base of wound and callouses perimeter of wound.  Increased drainage noted this session, resumed silverhydrofiber base of wound with xeroform on top to moisten edges of wound.  No reports of pain through session.   Requested pt to wear sandle to reduce weight bearing on wound during gait.     Wound Therapy - Functional Problem List Non-healing wound  affecting gait.    Factors Delaying/Impairing Wound Healing Altered sensation;Diabetes Mellitus;Infection - systemic/local;Multiple medical problems;Polypharmacy;Vascular compromise   Hydrotherapy Plan Debridement;Dressing change   Wound Therapy - Current Recommendations PT   Wound Plan Pt will benefit from continued skilled physical therapy  to prevent infection and improve wound bed for a healing environment.    Dressing  vaseline around periphery.  Silverhydrofiber placed in wound bed with xeroform on top for edges of wound.;gauze and kerlix           Problem List Patient Active Problem List   Diagnosis Date Noted  . Rectal bleeding 07/28/2015  . Chronic anticoagulation 07/28/2015  . PVD (peripheral vascular disease) ([place])   . S/P angioplasty 04/23/2015  . Peripheral arterial occlusive disease ([place]) 04/23/2015  . PAD (peripheral artery disease) ([place])   . Critical lower limb ischemia   . Toe ulcer, right ([place])   . Type II or unspecified type diabetes mellitus without mention of complication, uncontrolled 11/27/2013  . Acute respiratory failure with hypoxia ([place]) 11/26/2013  . CAP (community acquired pneumonia) 11/26/2013  . CHF exacerbation ([place]) 11/25/2013  . Cardiomyopathy, ischemic 06/13/2013  . At risk for sudden cardiac death Jun 20, 2013  . S/P cardiac catheterization, Rt &  Lt heart cath 06/05/2013 06/07/2013  . Acute on chronic combined systolic and diastolic HF (heart failure), NYHA class 3 ([place]) 06/04/2013  . Atrial flutter with rapid ventricular response ([place]) 06/02/2013  . Acute renal failure: Cr up to 1.8 06/02/2013  . Hyperkalemia 06/02/2013    Class: Acute  . LBBB (left bundle branch block) 06/01/2013  . Hypotension- secondary to AF and Diltiazem Rx 06/01/2013  . Obesity (BMI 30-39.9) 06/01/2013  . Acute on chronic diastolic heart failure - due to Afib AVR 06/01/2013  . Atrial fibrillation with RVR- converted with Amiodarone 05/31/2013  . Stroke- Lt brain 2003 04/08/2013  . Hemiplegia affecting right dominant side ([place]) 04/08/2013  . Cataract 04/08/2013  . GERD (gastroesophageal reflux disease) 04/08/2013  . S/P CABG x 3 - 2010 04/08/2013  . S/P AVR-tissue, 2010 04/08/2013  . DM2 (diabetes mellitus, type 2) ([place]) 04/08/2013  . Dyslipidemia 04/08/2013  . SUBACROMIAL BURSITIS, LEFT 11/05/2009    Ihor Austin, LPTA; CBIS [phone]  Aldona Lento 10/20/2015, 12:50 PM  [place], [name], [address] Phone: [phone]   Fax:  [phone]  Name: [name] MRN: [id number] Date of Birth: 08-12-1946

## 2015-10-22 ENCOUNTER — Ambulatory Visit (HOSPITAL_COMMUNITY): Payer: Medicare Other | Admitting: Physical Therapy

## 2015-10-22 DIAGNOSIS — R262 Difficulty in walking, not elsewhere classified: Secondary | ICD-10-CM

## 2015-10-22 DIAGNOSIS — R2681 Unsteadiness on feet: Secondary | ICD-10-CM

## 2015-10-22 DIAGNOSIS — S91109D Unspecified open wound of unspecified toe(s) without damage to nail, subsequent encounter: Secondary | ICD-10-CM | POA: Diagnosis not present

## 2015-10-22 NOTE — Therapy (Signed)
[place], [name], [address] Phone: [phone]   Fax:  [phone]  Wound Care Therapy  Patient Details  Name: [name] MRN: [id number] Date of Birth: 1945/09/14 No Data Recorded  Encounter Date: 10/22/2015 reassessement     PT End of Session - 10/22/15 1211    Visit Number 53   Number of Visits 104   Date for PT Re-Evaluation 11/21/15   Authorization Type UHC Medicare: Gcode and reassessment complete visit 3   Authorization Time Period 04/22/15 to 06/21/05   Authorization - Visit Number 53   Authorization - Number of Visits 10   PT Start Time 0935   PT Stop Time 1010   PT Time Calculation (min) 35 min   Activity Tolerance Patient tolerated treatment well   Behavior During Therapy [facility] for tasks assessed/performed      Past Medical History  Diagnosis Date  . Hypertension   . Diabetes mellitus     type 2   . History of valve replacement 2010    [facility] Ease bioprosthetic 3m  . Thyroid disease   . Stroke ([facility] 2003    R-sided weakness & upper extremity swelling   . Coronary artery disease   . Dyslipidemia   . At risk for sudden cardiac death 111/10/14 . PAF (paroxysmal atrial fibrillation) ([place] 05/2013    converted with amiodarone to SR  . Chronic anticoagulation 05/2013    started  . S/P cardiac catheterization, Rt & Lt heart cath 06/05/2013 12014-11-10 . S/P CABG x 3 2010  . GERD (gastroesophageal reflux disease)   . Cataract   . Chronic kidney disease     Patient reports he is seeing Kidney doctor in September    Past Surgical History  Procedure Laterality Date  . Coronary artery bypass graft  01/26/2009    LIMA to LAD, SVG to circumflex, SVG to PDA  . [facility]  [facility]26/2009    [facility]  . Transthoracic echocardiogram  09/2011    grade 1 diastolic dysfunction; increasing valve gradient; calcified MV annulus   . Cardiac  catheterization  06/05/2013    native 3 vessel disease; patent LIMA to LAD, SVG to OM and SVG to PDA; Elevated right and left heart filling pressures, with predominantly pulmonary venous hypertension, normal PVR  . Transthoracic echocardiogram  06/03/2013    EF 25-30%, mod conc. hypertrophy, grade 3 diastolic dysfunction; LA mod dilated; calcified MV annulus; transaortic gradients are normal for the bioprosthetic valve; inf vena cava dilated (elevated CVP) - LifeVest  . Left and right heart catheterization with coronary angiogram N/A 06/05/2013    Procedure: LEFT AND RIGHT HEART CATHETERIZATION WITH CORONARY ANGIOGRAM;  Surgeon: DaLeonie ManMD;  [facility];  Service: Cardiovascular;  Laterality: N/A;  . Graft(s) angiogram  06/05/2013    Procedure: GRAFT(S) ANCyril Loosen Surgeon: DaLeonie ManMD;  [facility];  Service: Cardiovascular;;  . Colonoscopy  2007    Dr. MaAviva Signsinternal hemorrhoids    There were no vitals filed for this visit.  Visit Diagnosis:  Open toe wound, subsequent encounter  Difficulty walking  Unsteadiness                 Wound Therapy - 10/22/15 1013    Subjective Pt continues to have pain from Rt hip down to ankle area states that his toe is bothering him as welll but not  as much as his leg.    Pain Assessment 0-10   Pain Score 7    Pain Type Acute pain   Pain Location Hip   Pain Orientation Right   Pain Descriptors / Indicators Aching   Pain Onset With Activity   Patients Stated Pain Goal 0   Pain Intervention(s) Distraction;Emotional support   Multiple Pain Sites Yes  great toe 4/10    Evaluation and Treatment Procedures agreed to   Wound Properties Date First Assessed: 06/23/15 Time First Assessed: 0900 Wound Type: Other (Comment) Location: Toe (Comment  which one) Location Orientation: Right Wound Description (Comments): medial  aspect of Rt great toe Present on Admission: No   Dressing Type Silver  hydrofiber;Impregnated gauze (petrolatum);Gauze (Comment)   Dressing Changed Changed   Dressing Status Intact;New drainage   Dressing Change Frequency Every 3 days   Site / Wound Assessment Yellow;Granulation tissue;Pale   % Wound base Red or Granulating 50%   % Wound base Yellow 50%   Peri-wound Assessment Erythema (blanchable)   Wound Length (cm) 1.5 cm  was 1.5 on 10/01/15   Wound Width (cm) 1.5 cm  was 1.5 on 10/01/2015   Wound Depth (cm) --  unknown; wound hypertrophic in center but edges have depth   Margins Attached edges (approximated)  partial approximation, some areas not yet   Drainage Amount Minimal   Drainage Description Serosanguineous   Treatment Cleansed;Debridement (Selective)   Incision Properties Date First Assessed: 04/23/15 Time First Assessed: 1738 Location: Groin Location Orientation: Left Present on Admission: No   Selective Debridement - Location wound bed and perimeter of wound   Selective Debridement - Tools Used Forceps;Scalpel   Selective Debridement - Tissue Removed slough and callous    Wound Therapy - Clinical Statement Due to peripheral arterial occlusive disease, non compliant DM, renal failure and past CVA with altered gait pattern causing increased stress on medial aspect of toe pt wound is extremely slow to heal and is at an increased risk of infection.  The pt toe has been infected numerous times since we have started treatment but has always responded well with antibiotic.  Pt wound is infected at this time but he is on antibiotic.   Pt had a stent placement two weeks ago.  Wound has slowly improved since the placement of the stent but we are still having difficulty in healing partially due the above issues.  Pt will need continues skilled care following his wound twice a week for 8 more weeks      Wound Therapy - Functional Problem List Non-healing wound  affecting gait.    Factors Delaying/Impairing Wound Healing Altered sensation;Diabetes  Mellitus;Infection - systemic/local;Multiple medical problems;Polypharmacy;Vascular compromise   Hydrotherapy Plan Debridement;Dressing change   Wound Therapy - Current Recommendations PT   Wound Plan Pt currently has infecton and is taking antibiotic for toe.  Pt needs continued skilled physical therapy to mointor healing and infection.  Pt has had a previous stroke and lives alone and is not able to care for wound on his own. PT may benefit from culture of wound to ensure correct antibiotic is being used.  Please order if you feel this is appropriate.  Continue treatment 2x a week for the next 8 weeks    Dressing  silver to wound, vaseline to callous followed  by 2x2, kling and taping    Decrease Necrotic Tissue to 20%   Decrease Necrotic Tissue - Progress Progressing toward goal   Increase Granulation Tissue to 80%  Increase Granulation Tissue - Progress Progressing toward goal   Decrease Length/Width/Depth by (cm) 1.0   Decrease Length/Width/Depth - Progress Progressing toward goal   Improve Drainage Characteristics Min   Improve Drainage Characteristics - Progress Progressing toward goal   Patient/Family will be able to  Independent in dressing change   Patient/Family Instruction Goal - Progress Progressing toward goal   Additional Wound Therapy Goal full healing of wound   Additional Wound Therapy Goal - Progress Not met   Goals/treatment plan/discharge plan were made with and agreed upon by patient/family Yes              G-Codes - 11-18-2015 1220    Functional Assessment Tool Used based on skilled clinical assessment of wound healling    Functional Limitation Other PT subsequent   Other PT Primary Current Status (Y6060) At least 40 percent but less than 60 percent impaired, limited or restricted   Other PT Primary Goal Status (O4599) At least 1 percent but less than 20 percent impaired, limited or restricted       Problem List Patient Active Problem List   Diagnosis Date  Noted  . Rectal bleeding 07/28/2015  . Chronic anticoagulation 07/28/2015  . PVD (peripheral vascular disease) ([place])   . S/P angioplasty 04/23/2015  . Peripheral arterial occlusive disease ([place]) 04/23/2015  . PAD (peripheral artery disease) ([place])   . Critical lower limb ischemia   . Toe ulcer, right ([place])   . Type II or unspecified type diabetes mellitus without mention of complication, uncontrolled 11/27/2013  . Acute respiratory failure with hypoxia ([place]) 11/26/2013  . CAP (community acquired pneumonia) 11/26/2013  . CHF exacerbation ([place]) 11/25/2013  . Cardiomyopathy, ischemic 06/13/2013  . At risk for sudden cardiac death 07/04/13  . S/P cardiac catheterization, Rt & Lt heart cath 06/05/2013 07/04/13  . Acute on chronic combined systolic and diastolic HF (heart failure), NYHA class 3 ([place]) 06/04/2013  . Atrial flutter with rapid ventricular response ([place]) 06/02/2013  . Acute renal failure: Cr up to 1.8 06/02/2013  . Hyperkalemia 06/02/2013    Class: Acute  . LBBB (left bundle branch block) 06/01/2013  . Hypotension- secondary to AF and Diltiazem Rx 06/01/2013  . Obesity (BMI 30-39.9) 06/01/2013  . Acute on chronic diastolic heart failure - due to Afib AVR 06/01/2013  . Atrial fibrillation with RVR- converted with Amiodarone 05/31/2013  . Stroke- Lt brain 2003 04/08/2013  . Hemiplegia affecting right dominant side ([place]) 04/08/2013  . Cataract 04/08/2013  . GERD (gastroesophageal reflux disease) 04/08/2013  . S/P CABG x 3 - 2010 04/08/2013  . S/P AVR-tissue, 2010 04/08/2013  . DM2 (diabetes mellitus, type 2) ([place]) 04/08/2013  . Dyslipidemia 04/08/2013  . SUBACROMIAL BURSITIS, LEFT 11/05/2009   Rayetta Humphrey, PT CLT [phone] 2015-11-18, 12:21 PM  [place], [name], [address] Phone: [phone]   Fax:  [phone]  Name: [name] MRN: [id number] Date of Birth: [birth year]

## 2015-10-23 ENCOUNTER — Other Ambulatory Visit: Payer: Self-pay

## 2015-10-23 ENCOUNTER — Ambulatory Visit: Payer: Self-pay

## 2015-10-23 NOTE — Patient Outreach (Signed)
[facility]) Care Management  10/23/2015  YAMATO RICHESON 18-Feb-1946 [id number]   Telephone call to patient for monthly call.  Male answered stating patient not in.  Health Coach contact information given for return call.    Plan: RN Health Coach will contact patient within 1-2 weeks.    [name], RN, MSN [place] [phone]

## 2015-10-27 ENCOUNTER — Ambulatory Visit (HOSPITAL_COMMUNITY): Payer: Medicare Other | Admitting: Physical Therapy

## 2015-10-27 DIAGNOSIS — S91109D Unspecified open wound of unspecified toe(s) without damage to nail, subsequent encounter: Secondary | ICD-10-CM

## 2015-10-27 DIAGNOSIS — R2681 Unsteadiness on feet: Secondary | ICD-10-CM

## 2015-10-27 DIAGNOSIS — R262 Difficulty in walking, not elsewhere classified: Secondary | ICD-10-CM

## 2015-10-27 NOTE — Therapy (Signed)
[name], [name], [address] Phone: [phone]   Fax:  [phone]  Wound Care Therapy  Patient Details  Name: [name] MRN: [id number] Date of Birth: 1946-07-21 No Data Recorded  Encounter Date: 10/27/2015      PT End of Session - 10/27/15 1044    Visit Number 52   Number of Visits 25   Date for PT Re-Evaluation 11/21/15   Authorization Type UHC Medicare: Gcode and reassessment complete visit 47   Authorization Time Period 04/22/15 to 06/21/05   Authorization - Visit Number 9   Authorization - Number of Visits 43   PT Start Time 0935   PT Stop Time 1015   PT Time Calculation (min) 40 min      Past Medical History  Diagnosis Date  . Hypertension   . Diabetes mellitus     type 2   . History of valve replacement 2010    [facility] Ease bioprosthetic 4m  . Thyroid disease   . Stroke ([facility] 2003    R-sided weakness & upper extremity swelling   . Coronary artery disease   . Dyslipidemia   . At risk for sudden cardiac death 12014-10-31 . PAF (paroxysmal atrial fibrillation) ([place] 05/2013    converted with amiodarone to SR  . Chronic anticoagulation 05/2013    started  . S/P cardiac catheterization, Rt & Lt heart cath 06/05/2013 1Oct 31, 2014 . S/P CABG x 3 2010  . GERD (gastroesophageal reflux disease)   . Cataract   . Chronic kidney disease     Patient reports he is seeing Kidney doctor in September    Past Surgical History  Procedure Laterality Date  . Coronary artery bypass graft  01/26/2009    LIMA to LAD, SVG to circumflex, SVG to PDA  . [facility]  [facility]  . Transthoracic echocardiogram  09/2011    grade 1 diastolic dysfunction; increasing valve gradient; calcified MV annulus   . Cardiac catheterization  06/05/2013    native 3 vessel disease; patent LIMA to LAD, SVG to OM and SVG to PDA; Elevated right and left  heart filling pressures, with predominantly pulmonary venous hypertension, normal PVR  . Transthoracic echocardiogram  06/03/2013    EF 25-30%, mod conc. hypertrophy, grade 3 diastolic dysfunction; LA mod dilated; calcified MV annulus; transaortic gradients are normal for the bioprosthetic valve; inf vena cava dilated (elevated CVP) - LifeVest  . Left and right heart catheterization with coronary angiogram N/A 06/05/2013    Procedure: LEFT AND RIGHT HEART CATHETERIZATION WITH CORONARY ANGIOGRAM;  Surgeon: DaLeonie ManMD;  [facility];  Service: Cardiovascular;  Laterality: N/A;  . Graft(s) angiogram  06/05/2013    Procedure: GRAFT(S) ANCyril Loosen Surgeon: DaLeonie ManMD;  [facility];  Service: Cardiovascular;;  . Colonoscopy  2007    Dr. MaAviva Signsinternal hemorrhoids    There were no vitals filed for this visit.  Visit Diagnosis:  Open toe wound, subsequent encounter  Difficulty walking  Unsteadiness                 Wound Therapy - 10/27/15 1030    Subjective Pt continues to have increased pain in his right side from his hip to his toe.  Suggested that pt begins stretching to decrease paoin    Pain Assessment 0-10   Pain Score 5    Pain  Type Acute pain   Pain Location Hip   Pain Orientation Right   Pain Descriptors / Indicators Aching   Patients Stated Pain Goal 0   Pain Intervention(s) Emotional support   Evaluation and Treatment Procedures agreed to   Wound Properties Date First Assessed: 06/23/15 Time First Assessed: 0900 Wound Type: Other (Comment) Location: Toe (Comment  which one) Location Orientation: Right Wound Description (Comments): medial  aspect of Rt great toe Present on Admission: No   Dressing Type Silver hydrofiber;Impregnated gauze (petrolatum);Gauze (Comment)   Dressing Changed Changed   Dressing Status Old drainage   Dressing Change Frequency Every 3 days   Site / Wound Assessment Friable;Granulation tissue;Pale    % Wound base Red or Granulating 50%   % Wound base Yellow 50%   Peri-wound Assessment Maceration  decreased erythema.   Wound Length (cm) 1.5 cm   Wound Width (cm) 1.5 cm   Margins Epibole (rolled edges)   Drainage Amount Moderate   Drainage Description Serosanguineous;No odor   Treatment Cleansed;Debridement (Selective)   Incision Properties Date First Assessed: 04/23/15 Time First Assessed: 1738 Location: Groin Location Orientation: Left Present on Admission: No   Selective Debridement - Location wound bed and edges as well as callous surrounding the wound    Selective Debridement - Tools Used Forceps;Scalpel;Scissors   Selective Debridement - Tissue Removed slough and callous    Wound Therapy - Clinical Statement Therapist attempted using a Darco boot for ambulation with patient to decrease pressure on wound but due to instability walking due to stroke pt was not comfortable with boot.  Pt has less redness of toe but has increased maceration.  Overall wound is looking slightly better compared to before his stent but healing is going very slowly    Wound Therapy - Functional Problem List nonhealing wound with difficulty wearing shoes.    Factors Delaying/Impairing Wound Healing Altered sensation;Diabetes Mellitus;Infection - systemic/local;Multiple medical problems;Polypharmacy;Vascular compromise   Hydrotherapy Plan Debridement;Dressing change   Wound Therapy - Current Recommendations PT   Wound Plan Continue with debridement and dressing change to wound    Dressing  silverhydrofiber to wound with vaseline to area around wound.    Decrease Necrotic Tissue to 20%   Decrease Necrotic Tissue - Progress Progressing toward goal   Increase Granulation Tissue to 80%   Increase Granulation Tissue - Progress Progressing toward goal   Decrease Length/Width/Depth by (cm) 1.0   Decrease Length/Width/Depth - Progress Progressing toward goal   Improve Drainage Characteristics Min   Improve Drainage  Characteristics - Progress Progressing toward goal   Patient/Family will be able to  independent    Patient/Family Instruction Goal - Progress Progressing toward goal   Additional Wound Therapy Goal full healing    Additional Wound Therapy Goal - Progress Not met                              Problem List Patient Active Problem List   Diagnosis Date Noted  . Rectal bleeding 07/28/2015  . Chronic anticoagulation 07/28/2015  . PVD (peripheral vascular disease) ([place])   . S/P angioplasty 04/23/2015  . Peripheral arterial occlusive disease ([place]) 04/23/2015  . PAD (peripheral artery disease) ([place])   . Critical lower limb ischemia   . Toe ulcer, right ([place])   . Type II or unspecified type diabetes mellitus without mention of complication, uncontrolled 11/27/2013  . Acute respiratory failure with hypoxia ([place]) 11/26/2013  . CAP (community acquired pneumonia)  11/26/2013  . CHF exacerbation ([place]) 11/25/2013  . Cardiomyopathy, ischemic 06/13/2013  . At risk for sudden cardiac death 06-12-2013  . S/P cardiac catheterization, Rt & Lt heart cath 06/05/2013 2013-06-12  . Acute on chronic combined systolic and diastolic HF (heart failure), NYHA class 3 ([place]) 06/04/2013  . Atrial flutter with rapid ventricular response ([place]) 06/02/2013  . Acute renal failure: Cr up to 1.8 06/02/2013  . Hyperkalemia 06/02/2013    Class: Acute  . LBBB (left bundle branch block) 06/01/2013  . Hypotension- secondary to AF and Diltiazem Rx 06/01/2013  . Obesity (BMI 30-39.9) 06/01/2013  . Acute on chronic diastolic heart failure - due to Afib AVR 06/01/2013  . Atrial fibrillation with RVR- converted with Amiodarone 05/31/2013  . Stroke- Lt brain 2003 04/08/2013  . Hemiplegia affecting right dominant side ([place]) 04/08/2013  . Cataract 04/08/2013  . GERD (gastroesophageal reflux disease) 04/08/2013  . S/P CABG x 3 - 2010 04/08/2013  . S/P AVR-tissue, 2010 04/08/2013  . DM2 (diabetes mellitus,  type 2) ([place]) 04/08/2013  . Dyslipidemia 04/08/2013  . SUBACROMIAL BURSITIS, LEFT 11/05/2009     Rayetta Humphrey, PT CLT [phone] 10/27/2015, 10:46 AM  [place], [name], [address] Phone: [phone]   Fax:  [phone]  Name: [name] MRN: [id number] Date of Birth: 07-17-1946

## 2015-10-29 ENCOUNTER — Ambulatory Visit (HOSPITAL_COMMUNITY): Payer: Medicare Other | Admitting: Physical Therapy

## 2015-10-29 DIAGNOSIS — R262 Difficulty in walking, not elsewhere classified: Secondary | ICD-10-CM | POA: Diagnosis not present

## 2015-10-29 DIAGNOSIS — S91109D Unspecified open wound of unspecified toe(s) without damage to nail, subsequent encounter: Secondary | ICD-10-CM

## 2015-10-29 DIAGNOSIS — R2681 Unsteadiness on feet: Secondary | ICD-10-CM | POA: Diagnosis not present

## 2015-10-29 NOTE — Therapy (Signed)
[place], [name], [address] Phone: [phone]   Fax:  [phone]  Wound Care Therapy  Patient Details  Name: [name] MRN: [id number] Date of Birth: [birth year] No Data Recorded  Encounter Date: 10/29/2015      PT End of Session - 10/29/15 1043    Visit Number 53   Number of Visits 13   Date for PT Re-Evaluation 11/21/15   Authorization Type UHC Medicare: Gcode and reassessment complete visit 99   Authorization Time Period 04/22/15 to 06/21/05   Authorization - Visit Number 20   Authorization - Number of Visits 38   PT Start Time 0930   PT Stop Time 1000   PT Time Calculation (min) 30 min   Activity Tolerance Patient tolerated treatment well   Behavior During Therapy [facility] for tasks assessed/performed      Past Medical History  Diagnosis Date  . Hypertension   . Diabetes mellitus     type 2   . History of valve replacement 2010    [facility] Ease bioprosthetic 53mm  . Thyroid disease   . Stroke [facility]) 2003    R-sided weakness & upper extremity swelling   . Coronary artery disease   . Dyslipidemia   . At risk for sudden cardiac death 06/23/2013  . PAF (paroxysmal atrial fibrillation) ([place]) 05/2013    converted with amiodarone to SR  . Chronic anticoagulation 05/2013    started  . S/P cardiac catheterization, Rt & Lt heart cath 06/05/2013 2013-06-23  . S/P CABG x 3 2010  . GERD (gastroesophageal reflux disease)   . Cataract   . Chronic kidney disease     Patient reports he is seeing Kidney doctor in September    Past Surgical History  Procedure Laterality Date  . Coronary artery bypass graft  01/26/2009    LIMA to LAD, SVG to circumflex, SVG to PDA  . Aortic valve replacement  01/26/2009    [facility] Ease bioprosthetic 8mm valve  . Transthoracic echocardiogram  09/2011    grade 1 diastolic dysfunction; increasing valve gradient; calcified MV annulus   . Cardiac catheterization   06/05/2013    native 3 vessel disease; patent LIMA to LAD, SVG to OM and SVG to PDA; Elevated right and left heart filling pressures, with predominantly pulmonary venous hypertension, normal PVR  . Transthoracic echocardiogram  06/03/2013    EF 25-30%, mod conc. hypertrophy, grade 3 diastolic dysfunction; LA mod dilated; calcified MV annulus; transaortic gradients are normal for the bioprosthetic valve; inf vena cava dilated (elevated CVP) - LifeVest  . Left and right heart catheterization with coronary angiogram N/A 06/05/2013    Procedure: LEFT AND RIGHT HEART CATHETERIZATION WITH CORONARY ANGIOGRAM;  Surgeon: Leonie Man, MD;  [facility];  Service: Cardiovascular;  Laterality: N/A;  . Graft(s) angiogram  06/05/2013    Procedure: GRAFT(S) Cyril Loosen;  Surgeon: Leonie Man, MD;  Location[facility];  Service: Cardiovascular;;  . Colonoscopy  2007    Dr. Aviva Signs: internal hemorrhoids    There were no vitals filed for this visit.  Visit Diagnosis:  Open toe wound, subsequent encounter  Difficulty walking  Unsteadiness                 Wound Therapy - 10/29/15 1007    Subjective no pain reported today.  states he has to go to [place] soon to pick up his son.  States he has more  sensation/tingling into toes.   Pain Assessment No/denies pain   Wound Properties Date First Assessed: 06/23/15 Time First Assessed: 0900 Wound Type: Other (Comment) Location: Toe (Comment  which one) Location Orientation: Right Wound Description (Comments): medial  aspect of Rt great toe Present on Admission: No   Dressing Type Silver hydrofiber;Impregnated gauze (petrolatum);Gauze (Comment)   Dressing Changed Changed   Dressing Status Old drainage   Dressing Change Frequency Every 3 days   Site / Wound Assessment Friable;Granulation tissue;Pale   % Wound base Red or Granulating 55%   % Wound base Yellow 45%   Peri-wound Assessment Maceration  decreased erythema.    Margins --  attached in places   Drainage Amount Moderate   Drainage Description Serosanguineous;No odor   Treatment Cleansed;Debridement (Selective)   Incision Properties Date First Assessed: 04/23/15 Time First Assessed: 1738 Location: Groin Location Orientation: Left Present on Admission: No   Selective Debridement - Location wound bed and edges of wound   Selective Debridement - Tools Used Forceps;Scalpel;Scissors   Selective Debridement - Tissue Removed slough and callous    Wound Therapy - Clinical Statement Pt with improving sensation and good perfusion to wound.  wound bed is above skin surface, however not hypergranulated.  Used more pressure today to encourage woundbed to position flush with skin surface for increased healing.  perimeter also macerated so continued with silver hydrofiber to absorb excess moisture from drainage.     Wound Therapy - Functional Problem List nonhealing wound with difficulty wearing shoes.    Factors Delaying/Impairing Wound Healing Altered sensation;Diabetes Mellitus;Infection - systemic/local;Multiple medical problems;Polypharmacy;Vascular compromise   Hydrotherapy Plan Debridement;Dressing change   Wound Therapy - Current Recommendations PT   Wound Plan Continue with debridement and dressing change to wound    Dressing  silverhydrofiber to wound with vaseline to area around wound.    Decrease Necrotic Tissue to 20%   Decrease Necrotic Tissue - Progress Progressing toward goal   Increase Granulation Tissue to 80%   Increase Granulation Tissue - Progress Progressing toward goal   Decrease Length/Width/Depth by (cm) 1.0   Decrease Length/Width/Depth - Progress Progressing toward goal   Improve Drainage Characteristics Min   Improve Drainage Characteristics - Progress Progressing toward goal   Patient/Family will be able to  independent    Patient/Family Instruction Goal - Progress Progressing toward goal   Additional Wound Therapy Goal full healing     Additional Wound Therapy Goal - Progress Progressing toward goal                              Problem List Patient Active Problem List   Diagnosis Date Noted  . Rectal bleeding 07/28/2015  . Chronic anticoagulation 07/28/2015  . PVD (peripheral vascular disease) ([place])   . S/P angioplasty 04/23/2015  . Peripheral arterial occlusive disease ([place]) 04/23/2015  . PAD (peripheral artery disease) ([place])   . Critical lower limb ischemia   . Toe ulcer, right ([place])   . Type II or unspecified type diabetes mellitus without mention of complication, uncontrolled 11/27/2013  . Acute respiratory failure with hypoxia ([place]) 11/26/2013  . CAP (community acquired pneumonia) 11/26/2013  . CHF exacerbation ([place]) 11/25/2013  . Cardiomyopathy, ischemic 06/13/2013  . At risk for sudden cardiac death 06-13-13  . S/P cardiac catheterization, Rt & Lt heart cath 06/05/2013 Jun 13, 2013  . Acute on chronic combined systolic and diastolic HF (heart failure), NYHA class 3 ([place]) 06/04/2013  . Atrial flutter with  rapid ventricular response ([place]) 06/02/2013  . Acute renal failure: Cr up to 1.8 06/02/2013  . Hyperkalemia 06/02/2013    Class: Acute  . LBBB (left bundle branch block) 06/01/2013  . Hypotension- secondary to AF and Diltiazem Rx 06/01/2013  . Obesity (BMI 30-39.9) 06/01/2013  . Acute on chronic diastolic heart failure - due to Afib AVR 06/01/2013  . Atrial fibrillation with RVR- converted with Amiodarone 05/31/2013  . Stroke- Lt brain 2003 04/08/2013  . Hemiplegia affecting right dominant side ([place]) 04/08/2013  . Cataract 04/08/2013  . GERD (gastroesophageal reflux disease) 04/08/2013  . S/P CABG x 3 - 2010 04/08/2013  . S/P AVR-tissue, 2010 04/08/2013  . DM2 (diabetes mellitus, type 2) ([place]) 04/08/2013  . Dyslipidemia 04/08/2013  . SUBACROMIAL BURSITIS, LEFT 11/05/2009    Teena Irani, PTA/CLT [phone]  10/29/2015, 10:45 AM  [name], [name], [address] Phone: [phone]   Fax:  [phone]  Name: [name] MRN: [id number] Date of Birth: [birth year]

## 2015-11-02 ENCOUNTER — Other Ambulatory Visit: Payer: Self-pay

## 2015-11-02 NOTE — Patient Outreach (Signed)
[name]) Care Management  [place]  11/02/2015   [name] Nov 27, 1945 [id number]  Subjective: Telephone call to patient for  monthly outreach call. Patient reports he is not feeling his best today.  He states he is just a little tired. He states he did not sleep well last night. Patient reports that his sugar was also a little high this morning at 180 as explanation.  Discussed with patient diabetes and diet control in order to keep sugar under control and to assist with wound healing.  He verbalized understanding.  Patient mentions that he hopes he moves soon. Patient currently on the waiting list for an apartment in [place].  Patient states he wants to be near his children.  Patient states he turned down one because it had stairs but states he is on the top of the waiting list.  Patient denies pain presently.  No concerns voiced.      Objective:   Current Medications:  Current Outpatient Prescriptions  Medication Sig Dispense Refill  . acetaminophen (TYLENOL) 500 MG tablet Take 1,000 mg by mouth 2 (two) times daily as needed for headache. For pain/headaches    . ALPRAZolam (XANAX) 1 MG tablet Take 1 mg by mouth 4 (four) times daily as needed for anxiety.     Marland Kitchen amiodarone (PACERONE) 200 MG tablet TAKE ONE (1) TABLET EACH DAY 120 tablet 2  . apixaban (ELIQUIS) 5 MG TABS tablet Take 1 tablet (5 mg total) by mouth 2 (two) times daily. 180 tablet 1  . atorvastatin (LIPITOR) 20 MG tablet Take 1 tablet (20 mg total) by mouth daily at 6 PM. 90 tablet 1  . celecoxib (CELEBREX) 200 MG capsule Take 1 capsule by mouth every evening.     . clopidogrel (PLAVIX) 75 MG tablet Take 1 tablet (75 mg total) by mouth daily. (Patient taking differently: Take 75 mg by mouth every evening. ) 30 tablet 3  . docusate sodium (COLACE) 100 MG capsule Take 100 mg by mouth 2 (two) times daily.      . fenofibrate (TRICOR) 145 MG tablet Take 145 mg by mouth daily.    . ferrous  sulfate 325 (65 FE) MG EC tablet Take 325 mg by mouth daily with breakfast.    . gabapentin (NEURONTIN) 300 MG capsule Take 300 mg by mouth 2 (two) times daily.     Marland Kitchen HYDROcodone-acetaminophen (NORCO) 10-325 MG per tablet Take 0.5 tablets by mouth 2 (two) times daily.     . insulin glargine (LANTUS) 100 UNIT/ML injection Inject 60-75 Units into the skin 2 (two) times daily. Takes 75 units in the morning and 60 units in the evening    . insulin lispro (HUMALOG) 100 UNIT/ML injection Inject 35-50 Units into the skin 3 (three) times daily before meals.     Marland Kitchen levothyroxine (SYNTHROID, LEVOTHROID) 25 MCG tablet Take 25 mcg by mouth daily.      . metoprolol succinate (TOPROL-XL) 25 MG 24 hr tablet TAKE ONE (1) TABLET EACH DAY 30 tablet 8  . Omega-3 Fatty Acids (FISH OIL) 1200 MG CAPS Take 1,200 mg by mouth 2 (two) times daily.    . pantoprazole (PROTONIX) 40 MG tablet Take 1 tablet (40 mg total) by mouth 2 (two) times daily. 180 tablet 1  . silver sulfADIAZINE (SILVADENE) 1 % cream Apply 1 application topically 2 (two) times daily.     Marland Kitchen torsemide (DEMADEX) 10 MG tablet Take 1 tablet (10 mg total) by mouth daily.  30 tablet 11  . hydrocortisone (PROCTOSOL HC) 2.5 % rectal cream Place 1 application rectally 2 (two) times daily. For 10 days (Patient not taking: Reported on 10/07/2015) 30 g 0  . polyethylene glycol-electrolytes (NULYTELY/GOLYTELY) 420 G solution Take 4,000 mLs by mouth once. (Patient not taking: Reported on 10/07/2015) 4000 mL 0   No current facility-administered medications for this visit.    Functional Status:  In your present state of health, do you have any difficulty performing the following activities: 09/18/2015 08/21/2015  Hearing? N N  Vision? N N  Difficulty concentrating or making decisions? N N  Walking or climbing stairs? Y -  Dressing or bathing? N N  Doing errands, shopping? - Scientist, forensic and eating ? - N  Using the Toilet? - N  In the past six months, have you  accidently leaked urine? - N  Do you have problems with loss of bowel control? - N  Managing your Medications? - N  Managing your Finances? - N  Housekeeping or managing your Housekeeping? - Y    Fall/Depression Screening: PHQ 2/9 Scores 11/02/2015 09/30/2015 09/21/2015 08/25/2015 08/21/2015 07/30/2015 07/27/2015  PHQ - 2 Score 2 2 2  0 0 0 0  PHQ- 9 Score 7 7 7  - - - -    Assessment: Patient continues to benefit from health coach outreach for disease management and support  Plan:  [facility] CM Care Plan Problem Two        Most Recent Value   Care Plan Problem Two  Uncontrolled diabetes as evidenced by elevated CBGs, HGA1C and nonhealing wound.   Role Documenting the Problem Two  [place] for Problem Two  Active   Interventions for Problem Two Long Term Goal   RN Health Coach reinforced with patient signs of wound infection and importance of protein in his diet to promote healing.    THN Long Term Goal (31-90) days  Patient stated goal "I want my foot to heal", Patient foot will show improvement to wound over next 90 days   THN Long Term Goal Start Date  08/25/15 [Goal continued]   THN CM Short Term Goal #3 (0-30 days)  Patient CBG's will be under 200 within the next 30 days   THN CM Short Term Goal #3 Start Date  09/30/15 [goal continued]   Interventions for Short Term Goal #3  [place] reinforced with patient the importance of limiting carbohydrates and sweets.  Also reinforced goal of keeping sugars less than 200.        RN Health Coach will contact patient within one month and patient agrees to next outreach.    [name], RN, MSN [place] [phone]

## 2015-11-03 ENCOUNTER — Ambulatory Visit (HOSPITAL_COMMUNITY): Payer: Medicare Other

## 2015-11-03 DIAGNOSIS — R2681 Unsteadiness on feet: Secondary | ICD-10-CM

## 2015-11-03 DIAGNOSIS — R262 Difficulty in walking, not elsewhere classified: Secondary | ICD-10-CM | POA: Diagnosis not present

## 2015-11-03 DIAGNOSIS — S91109D Unspecified open wound of unspecified toe(s) without damage to nail, subsequent encounter: Secondary | ICD-10-CM

## 2015-11-03 NOTE — Therapy (Signed)
[place], [name], [address] Phone: [phone]   Fax:  [phone]  Wound Care Therapy  Patient Details  Name: [name] MRN: [id number] Date of Birth: 1946/02/09 No Data Recorded  Encounter Date: 11/03/2015      PT End of Session - 11/03/15 1549    Visit Number 13   Number of Visits 77   Date for PT Re-Evaluation 11/21/15   Authorization Time Period 04/22/15 to 06/21/05   Authorization - Visit Number 58   Authorization - Number of Visits 31   PT Start Time [id number]   PT Stop Time 1550   PT Time Calculation (min) 29 min   Activity Tolerance Patient tolerated treatment well   Behavior During Therapy [facility] for tasks assessed/performed      Past Medical History  Diagnosis Date  . Hypertension   . Diabetes mellitus     type 2   . History of valve replacement 2010    [facility] Ease bioprosthetic 57mm  . Thyroid disease   . Stroke [facility]) 2003    R-sided weakness & upper extremity swelling   . Coronary artery disease   . Dyslipidemia   . At risk for sudden cardiac death Jul 03, 2013  . PAF (paroxysmal atrial fibrillation) ([place]) 05/2013    converted with amiodarone to SR  . Chronic anticoagulation 05/2013    started  . S/P cardiac catheterization, Rt & Lt heart cath 06/05/2013 Jul 03, 2013  . S/P CABG x 3 2010  . GERD (gastroesophageal reflux disease)   . Cataract   . Chronic kidney disease     Patient reports he is seeing Kidney doctor in September    Past Surgical History  Procedure Laterality Date  . Coronary artery bypass graft  01/26/2009    LIMA to LAD, SVG to circumflex, SVG to PDA  . Aortic valve replacement  01/26/2009    [facility] Ease bioprosthetic 30mm valve  . Transthoracic echocardiogram  09/2011    grade 1 diastolic dysfunction; increasing valve gradient; calcified MV annulus   . Cardiac catheterization  06/05/2013    native 3 vessel disease; patent LIMA to LAD, SVG to OM and  SVG to PDA; Elevated right and left heart filling pressures, with predominantly pulmonary venous hypertension, normal PVR  . Transthoracic echocardiogram  06/03/2013    EF 25-30%, mod conc. hypertrophy, grade 3 diastolic dysfunction; LA mod dilated; calcified MV annulus; transaortic gradients are normal for the bioprosthetic valve; inf vena cava dilated (elevated CVP) - LifeVest  . Left and right heart catheterization with coronary angiogram N/A 06/05/2013    Procedure: LEFT AND RIGHT HEART CATHETERIZATION WITH CORONARY ANGIOGRAM;  Surgeon: Leonie Man, MD;  Location: [facility] CATH LAB;  Service: Cardiovascular;  Laterality: N/A;  . Graft(s) angiogram  06/05/2013    Procedure: GRAFT(S) Cyril Loosen;  Surgeon: Leonie Man, MD;  Location: [facility] CATH LAB;  Service: Cardiovascular;;  . Colonoscopy  2007    Dr. Aviva Signs: internal hemorrhoids    There were no vitals filed for this visit.  Visit Diagnosis:  Open toe wound, subsequent encounter  Difficulty walking  Unsteadiness      Subjective Assessment - 11/03/15 1548    Subjective Pt stated he is feeling good today, reports increased sensations to toe.  Dressings are intact, noted blood through dressings   Pertinent History Patient reports that this wound started as a small little brown spot on the end of his toe that just kept getting  bigger; he thinks it started about 8 months ago. He reports that it is now slowly getting better but has been healing slow. He states  that it has been getting better.    Currently in Pain? No/denies                   Wound Therapy - 11/03/15 1758    Subjective Pt stated he is feeling good today, reports increased sensations to toe.  Dressings are intact, noted blood through dressings   Patient and Family Stated Goals wound to heal    Pain Assessment No/denies pain   Pain Score 0-No pain   Wound Properties Date First Assessed: 06/23/15 Time First Assessed: 0900 Wound Type: Other (Comment)  Location: Toe (Comment  which one) Location Orientation: Right Wound Description (Comments): medial  aspect of Rt great toe Present on Admission: No   Dressing Type Silver hydrofiber;Gauze (Comment);Abdominal pads  silver hydrofiber, vaseline, gauze and ABD pad   Dressing Changed Changed   Dressing Status Old drainage   Dressing Change Frequency Every 3 days   Site / Wound Assessment Friable;Granulation tissue;Pale   % Wound base Red or Granulating 55%   % Wound base Yellow 45%   Peri-wound Assessment Maceration   Margins --  partial attached edges 1:00-4:30   Drainage Amount Moderate   Drainage Description Serosanguineous;No odor   Treatment Cleansed;Debridement (Selective)   Incision Properties Date First Assessed: 04/23/15 Time First Assessed: 1738 Location: Groin Location Orientation: Left Present on Admission: No   Selective Debridement - Location wound bed and edges of wound   Selective Debridement - Tools Used Forceps;Scalpel   Selective Debridement - Tissue Removed slough and callous    Wound Therapy - Clinical Statement Improved approximation edges 1:00-4:30/5:00.  Improved sensation and good perfusion to wound.  Wound bed now raised over skin surrounding, not hypergranulated though.  Continued with silver hydrofiber for drainage with increased pressure over wound bed to encourage wound bed to flush with skin perimeter.  Added ABD pad for extra pressure and drainage.  No reports of pain through session.   Wound Therapy - Functional Problem List nonhealing wound with difficulty wearing shoes.    Factors Delaying/Impairing Wound Healing Altered sensation;Diabetes Mellitus;Infection - systemic/local;Multiple medical problems;Polypharmacy;Vascular compromise   Hydrotherapy Plan Debridement;Dressing change   Wound Therapy - Current Recommendations PT   Wound Plan Continue with debridement and dressing change to wound    Dressing  silver hydrofiber, vaseline, gauze and ABD pad            Problem List Patient Active Problem List   Diagnosis Date Noted  . Rectal bleeding 07/28/2015  . Chronic anticoagulation 07/28/2015  . PVD (peripheral vascular disease) ([place])   . S/P angioplasty 04/23/2015  . Peripheral arterial occlusive disease ([place]) 04/23/2015  . PAD (peripheral artery disease) ([place])   . Critical lower limb ischemia   . Toe ulcer, right ([place])   . Type II or unspecified type diabetes mellitus without mention of complication, uncontrolled 11/27/2013  . Acute respiratory failure with hypoxia ([place]) 11/26/2013  . CAP (community acquired pneumonia) 11/26/2013  . CHF exacerbation ([place]) 11/25/2013  . Cardiomyopathy, ischemic 06/13/2013  . At risk for sudden cardiac death 06/19/13  . S/P cardiac catheterization, Rt & Lt heart cath 06/05/2013 06-19-2013  . Acute on chronic combined systolic and diastolic HF (heart failure), NYHA class 3 ([place]) 06/04/2013  . Atrial flutter with rapid ventricular response ([place]) 06/02/2013  . Acute renal failure: Cr up to 1.8 06/02/2013  .  Hyperkalemia 06/02/2013    Class: Acute  . LBBB (left bundle branch block) 06/01/2013  . Hypotension- secondary to AF and Diltiazem Rx 06/01/2013  . Obesity (BMI 30-39.9) 06/01/2013  . Acute on chronic diastolic heart failure - due to Afib AVR 06/01/2013  . Atrial fibrillation with RVR- converted with Amiodarone 05/31/2013  . Stroke- Lt brain 2003 04/08/2013  . Hemiplegia affecting right dominant side ([place]) 04/08/2013  . Cataract 04/08/2013  . GERD (gastroesophageal reflux disease) 04/08/2013  . S/P CABG x 3 - 2010 04/08/2013  . S/P AVR-tissue, 2010 04/08/2013  . DM2 (diabetes mellitus, type 2) ([place]) 04/08/2013  . Dyslipidemia 04/08/2013  . SUBACROMIAL BURSITIS, LEFT 11/05/2009   Ihor Austin, LPTA; CBIS [phone]  Aldona Lento 11/03/2015, 6:07 PM  [place], [name], [address] Phone: [phone]    Fax:  [phone]  Name: [name] MRN: [id number] Date of Birth: 16-Jan-1946

## 2015-11-05 ENCOUNTER — Other Ambulatory Visit (HOSPITAL_COMMUNITY): Payer: Self-pay | Admitting: Interventional Radiology

## 2015-11-05 ENCOUNTER — Ambulatory Visit (HOSPITAL_COMMUNITY): Payer: Medicare Other | Admitting: Physical Therapy

## 2015-11-05 ENCOUNTER — Ambulatory Visit
Admission: RE | Admit: 2015-11-05 | Discharge: 2015-11-05 | Disposition: A | Discharge status: Home / Self Care | Payer: Medicare Other | Source: Ambulatory Visit | Attending: Interventional Radiology | Admitting: Interventional Radiology

## 2015-11-05 DIAGNOSIS — I739 Peripheral vascular disease, unspecified: Secondary | ICD-10-CM

## 2015-11-05 DIAGNOSIS — L97519 Non-pressure chronic ulcer of other part of right foot with unspecified severity: Secondary | ICD-10-CM | POA: Diagnosis not present

## 2015-11-05 DIAGNOSIS — Z0000 Encounter for general adult medical examination without abnormal findings: Secondary | ICD-10-CM | POA: Diagnosis not present

## 2015-11-05 DIAGNOSIS — Z1389 Encounter for screening for other disorder: Secondary | ICD-10-CM | POA: Diagnosis not present

## 2015-11-05 NOTE — Progress Notes (Signed)
Chief Complaint: Patient was seen in follow-up today for  Chief Complaint  Patient presents with  . Follow-up    1 mo follow up   at the request of [place]  Referring Physician(s): McCullough,Heath  History of Present Illness: [name] is a 70 y.o. male with a history of [place] stage V critical limb ischemia. He has now undergone endovascular recanalization of the occluded superficial femoral artery as well as atherectomy and angioplasty of critical popliteal and tibioperoneal trunk stenoses.  He has noticed significant improvement in the feeling of his right lower extremity. Walking is easier. He reports that his wound care nurses have noticed an acceleration has healing. He does note that his great toe is occasionally more painful as he is developing increased sensation in the toe following revascularization. Overall, he is very happy with his care and progress. Denies claudication or wounds in the currently untreated left lower extremity.  Past Medical History  Diagnosis Date  . Hypertension   . Diabetes mellitus     type 2   . History of valve replacement 2010    [facility] Ease bioprosthetic 13mm  . Thyroid disease   . Stroke [facility]) 2003    R-sided weakness & upper extremity swelling   . Coronary artery disease   . Dyslipidemia   . At risk for sudden cardiac death Jun 14, 2013  . PAF (paroxysmal atrial fibrillation) ([place]) 05/2013    converted with amiodarone to SR  . Chronic anticoagulation 05/2013    started  . S/P cardiac catheterization, Rt & Lt heart cath 06/05/2013 Jun 14, 2013  . S/P CABG x 3 2010  . GERD (gastroesophageal reflux disease)   . Cataract   . Chronic kidney disease     Patient reports he is seeing Kidney doctor in September    Past Surgical History  Procedure Laterality Date  . Coronary artery bypass graft  01/26/2009    LIMA to LAD, SVG to circumflex, SVG to PDA  . Aortic valve replacement  01/26/2009    [facility]  Ease bioprosthetic 57mm valve  . Transthoracic echocardiogram  09/2011    grade 1 diastolic dysfunction; increasing valve gradient; calcified MV annulus   . Cardiac catheterization  06/05/2013    native 3 vessel disease; patent LIMA to LAD, SVG to OM and SVG to PDA; Elevated right and left heart filling pressures, with predominantly pulmonary venous hypertension, normal PVR  . Transthoracic echocardiogram  06/03/2013    EF 25-30%, mod conc. hypertrophy, grade 3 diastolic dysfunction; LA mod dilated; calcified MV annulus; transaortic gradients are normal for the bioprosthetic valve; inf vena cava dilated (elevated CVP) - LifeVest  . Left and right heart catheterization with coronary angiogram N/A 06/05/2013    Procedure: LEFT AND RIGHT HEART CATHETERIZATION WITH CORONARY ANGIOGRAM;  Surgeon: Leonie Man, MD;  Location: [facility] CATH LAB;  Service: Cardiovascular;  Laterality: N/A;  . Graft(s) angiogram  06/05/2013    Procedure: GRAFT(S) Cyril Loosen;  Surgeon: Leonie Man, MD;  Location: [facility] CATH LAB;  Service: Cardiovascular;;  . Colonoscopy  2007    Dr. Aviva Signs: internal hemorrhoids    Allergies: Review of patient's allergies indicates no known allergies.  Medications: Prior to Admission medications   Medication Sig Start Date End Date Taking? Authorizing Provider  acetaminophen (TYLENOL) 500 MG tablet Take 1,000 mg by mouth 2 (two) times daily as needed for headache. For pain/headaches    Historical Provider, MD  ALPRAZolam Duanne Moron) 1 MG tablet Take 1 mg by  mouth 4 (four) times daily as needed for anxiety.     Historical Provider, MD  amiodarone (PACERONE) 200 MG tablet TAKE ONE (1) TABLET EACH DAY 07/15/15   Pixie Casino, MD  apixaban (ELIQUIS) 5 MG TABS tablet Take 1 tablet (5 mg total) by mouth 2 (two) times daily. 04/15/15   Pixie Casino, MD  atorvastatin (LIPITOR) 20 MG tablet Take 1 tablet (20 mg total) by mouth daily at 6 PM. 10/30/14   Pixie Casino, MD  celecoxib  (CELEBREX) 200 MG capsule Take 1 capsule by mouth every evening.  04/07/14   Historical Provider, MD  clopidogrel (PLAVIX) 75 MG tablet Take 1 tablet (75 mg total) by mouth daily. Patient taking differently: Take 75 mg by mouth every evening.  04/25/15   Hedy Jacob, PA-C  docusate sodium (COLACE) 100 MG capsule Take 100 mg by mouth 2 (two) times daily.      Historical Provider, MD  fenofibrate (TRICOR) 145 MG tablet Take 145 mg by mouth daily.    Historical Provider, MD  ferrous sulfate 325 (65 FE) MG EC tablet Take 325 mg by mouth daily with breakfast.    Historical Provider, MD  gabapentin (NEURONTIN) 300 MG capsule Take 300 mg by mouth 2 (two) times daily.     Historical Provider, MD  HYDROcodone-acetaminophen (NORCO) 10-325 MG per tablet Take 0.5 tablets by mouth 2 (two) times daily.  03/31/14   Historical Provider, MD  hydrocortisone (PROCTOSOL HC) 2.5 % rectal cream Place 1 application rectally 2 (two) times daily. For 10 days Patient not taking: Reported on 10/07/2015 07/28/15   Mahala Menghini, PA-C  insulin glargine (LANTUS) 100 UNIT/ML injection Inject 60-75 Units into the skin 2 (two) times daily. Takes 75 units in the morning and 60 units in the evening    Historical Provider, MD  insulin lispro (HUMALOG) 100 UNIT/ML injection Inject 35-50 Units into the skin 3 (three) times daily before meals.     Historical Provider, MD  levothyroxine (SYNTHROID, LEVOTHROID) 25 MCG tablet Take 25 mcg by mouth daily.      Historical Provider, MD  metoprolol succinate (TOPROL-XL) 25 MG 24 hr tablet TAKE ONE (1) TABLET EACH DAY 10/02/15   Pixie Casino, MD  Omega-3 Fatty Acids (FISH OIL) 1200 MG CAPS Take 1,200 mg by mouth 2 (two) times daily.    Historical Provider, MD  pantoprazole (PROTONIX) 40 MG tablet Take 1 tablet (40 mg total) by mouth 2 (two) times daily. 10/30/14   Pixie Casino, MD  polyethylene glycol-electrolytes (NULYTELY/GOLYTELY) 420 G solution Take 4,000 mLs by mouth once. Patient not  taking: Reported on 10/07/2015 08/05/15   Mahala Menghini, PA-C  silver sulfADIAZINE (SILVADENE) 1 % cream Apply 1 application topically 2 (two) times daily.     Historical Provider, MD  torsemide (DEMADEX) 10 MG tablet Take 1 tablet (10 mg total) by mouth daily. 06/29/15   Pixie Casino, MD     Family History  Problem Relation Age of Onset  . Heart attack Mother   . Liver disease Brother   . Diabetes Sister     x4    Social History   Social History  . Marital Status: Divorced    Spouse Name: N/A  . Number of Children: 4  . Years of Education: N/A   Occupational History  . 4    Social History Main Topics  . Smoking status: Former Smoker    Types: Cigarettes    Quit  date: 08/15/2001  . Smokeless tobacco: Former Systems developer    Types: Chew     Comment: Quit in 2003  . Alcohol Use: No  . Drug Use: No  . Sexual Activity: Not Currently   Other Topics Concern  . Not on file   Social History Narrative   Review of Systems: A 12 point ROS discussed and pertinent positives are indicated in the HPI above.  All other systems are negative.  Review of Systems  Vital Signs: BP 127/54 mmHg  Pulse 67  Temp(Src) 97.6 F (36.4 C) (Oral)  Resp 14  SpO2 96%  Physical Exam  Constitutional: He is oriented to person, place, and time. He appears well-developed and well-nourished. No distress.  HENT:  Head: Normocephalic and atraumatic.  Eyes: No scleral icterus.  Cardiovascular: Normal rate.   Pulmonary/Chest: Effort normal and breath sounds normal.  Abdominal: Soft. He exhibits no distension. There is no tenderness.  Musculoskeletal:       Feet:  Healing 1.5 cm arterial ulcer medial aspect right great toe  Neurological: He is alert and oriented to person, place, and time.  Psychiatric: He has a normal mood and affect. His behavior is normal.  Nursing note and vitals reviewed.   Mallampati Score:  Labs:  CBC:  Recent Labs  04/23/15 1000 09/18/15 0656 10/07/15 0758  WBC  7.0 6.4 5.2  HGB 13.8 14.7 13.5  HCT 41.5 41.6 39.8  PLT 160 159 135*    COAGS:  Recent Labs  04/23/15 1000 09/18/15 0656 10/07/15 0758  INR 1.07 1.09 1.02  APTT  --  31 29    BMP:  Recent Labs  03/18/15 0831 04/23/15 1000 06/29/15 1010 09/18/15 0656 10/07/15 0758  NA  --  138 141 139 138  K  --  4.7 4.3 4.7 4.4  CL  --  103 100 102 101  CO2  --  26 28 26 23   GLUCOSE  --  277* 192* 349* 340*  BUN 22 21* 23 16 19   CALCIUM  --  9.6 9.7 9.4 9.2  CREATININE 1.35* 1.35* 1.38* 1.55* 1.43*  GFRNONAA 54* 53*  --  45* 49*  GFRAA 63 >60  --  52* 57*    LIVER FUNCTION TESTS:  Recent Labs  06/29/15 1010  BILITOT 0.4  AST 32  ALT 50*  ALKPHOS 50  PROT 6.6  ALBUMIN 4.2    TUMOR MARKERS: No results for input(s): AFPTM, CEA, CA199, CHROMGRNA in the last 8760 hours.  Assessment and Plan:  Significant interval healing in the right great toe ulcer. Improved sensation to the toe and bleeding during debridement at wound care.  1.) Return to clinic in 6 weeks with repeat ABI, PVRs and right lower extremity duplex ultrasound.    Electronically Signed: Jacqulynn Cadet 11/05/2015, 3:28 PM   I spent a total of 15 Minutes in face to face in clinical consultation, greater than 50% of which was counseling/coordinating care for critical limb ischemia.

## 2015-11-07 ENCOUNTER — Other Ambulatory Visit: Payer: Self-pay | Admitting: Internal Medicine

## 2015-11-10 ENCOUNTER — Ambulatory Visit (HOSPITAL_COMMUNITY): Payer: Medicare Other | Admitting: Physical Therapy

## 2015-11-10 DIAGNOSIS — S91109D Unspecified open wound of unspecified toe(s) without damage to nail, subsequent encounter: Secondary | ICD-10-CM | POA: Diagnosis not present

## 2015-11-10 DIAGNOSIS — R262 Difficulty in walking, not elsewhere classified: Secondary | ICD-10-CM

## 2015-11-10 DIAGNOSIS — R2681 Unsteadiness on feet: Secondary | ICD-10-CM

## 2015-11-10 NOTE — Therapy (Signed)
[place], [name], [address] Phone: [phone]   Fax:  [phone]  Wound Care Therapy  Patient Details  Name: [name] MRN: [id number] Date of Birth: 10-May-1946 No Data Recorded  Encounter Date: 11/10/2015    Past Medical History  Diagnosis Date  . Hypertension   . Diabetes mellitus     type 2   . History of valve replacement 2010    [facility] Ease bioprosthetic 23mm  . Thyroid disease   . Stroke [facility]) 2003    R-sided weakness & upper extremity swelling   . Coronary artery disease   . Dyslipidemia   . At risk for sudden cardiac death 2013-06-16  . PAF (paroxysmal atrial fibrillation) ([place]) 05/2013    converted with amiodarone to SR  . Chronic anticoagulation 05/2013    started  . S/P cardiac catheterization, Rt & Lt heart cath 06/05/2013 2013/06/16  . S/P CABG x 3 2010  . GERD (gastroesophageal reflux disease)   . Cataract   . Chronic kidney disease     Patient reports he is seeing Kidney doctor in September    Past Surgical History  Procedure Laterality Date  . Coronary artery bypass graft  01/26/2009    LIMA to LAD, SVG to circumflex, SVG to PDA  . Aortic valve replacement  01/26/2009    [facility] Ease bioprosthetic 55mm valve  . Transthoracic echocardiogram  09/2011    grade 1 diastolic dysfunction; increasing valve gradient; calcified MV annulus   . Cardiac catheterization  06/05/2013    native 3 vessel disease; patent LIMA to LAD, SVG to OM and SVG to PDA; Elevated right and left heart filling pressures, with predominantly pulmonary venous hypertension, normal PVR  . Transthoracic echocardiogram  06/03/2013    EF 25-30%, mod conc. hypertrophy, grade 3 diastolic dysfunction; LA mod dilated; calcified MV annulus; transaortic gradients are normal for the bioprosthetic valve; inf vena cava dilated (elevated CVP) - LifeVest  . Left and right heart catheterization with coronary  angiogram N/A 06/05/2013    Procedure: LEFT AND RIGHT HEART CATHETERIZATION WITH CORONARY ANGIOGRAM;  Surgeon: Leonie Man, MD;  Location: [facility] CATH LAB;  Service: Cardiovascular;  Laterality: N/A;  . Graft(s) angiogram  06/05/2013    Procedure: GRAFT(S) Cyril Loosen;  Surgeon: Leonie Man, MD;  Location: [facility] CATH LAB;  Service: Cardiovascular;;  . Colonoscopy  2007    Dr. Aviva Signs: internal hemorrhoids    There were no vitals filed for this visit.  Visit Diagnosis:  Open toe wound, subsequent encounter  Difficulty walking  Unsteadiness                 Wound Therapy - 11/10/15 1007    Subjective Pt states he is suppose to be moving on Monday April 3rd.  States MD is pleased with improvements in toe.  No pain but increased sensation.   Patient and Family Stated Goals wound to heal    Pain Assessment No/denies pain   Pain Score 0-No pain   Wound Properties Date First Assessed: 06/23/15 Time First Assessed: 0900 Wound Type: Other (Comment) Location: Toe (Comment  which one) Location Orientation: Right Wound Description (Comments): medial  aspect of Rt great toe Present on Admission: No   Dressing Type Gauze (Comment);Impregnated gauze (bismuth)  silver hydrofiber, vaseline, gauze and ABD pad   Dressing Changed Changed   Dressing Status Old drainage   Dressing Change Frequency Every 3 days   Site /  Wound Assessment Friable;Granulation tissue;Pale   % Wound base Red or Granulating 55%   % Wound base Yellow 45%   Peri-wound Assessment Maceration   Margins --  partial attached edges 1:00-4:30   Drainage Amount Minimal   Drainage Description Serosanguineous;No odor   Treatment Cleansed;Debridement (Selective)   Incision Properties Date First Assessed: 04/23/15 Time First Assessed: 1738 Location: Groin Location Orientation: Left Present on Admission: No   Selective Debridement - Location wound bed and edges of wound   Selective Debridement - Tools Used Forceps;Scissors    Selective Debridement - Tissue Removed slough and callous    Wound Therapy - Clinical Statement Continued maceration around the edges of wound with some approximation but mostly unattached.  Trimmed the edges to promote approximation.  Dry skin debrided from around nailbed and wound.  Small area began bleeding with skin removal but had stopped prior to bandaging.  Good perfusion to wound.  Wound bed now raised over skin surrounding but not hypergranulated.  Changed to xeroform today to improve moisture perimeter of wound.    No reports of pain through session.  Will need to remeasure next session incase it is his last appointment.    Wound Therapy - Functional Problem List nonhealing wound with difficulty wearing shoes.    Factors Delaying/Impairing Wound Healing Altered sensation;Diabetes Mellitus;Infection - systemic/local;Multiple medical problems;Polypharmacy;Vascular compromise   Hydrotherapy Plan Debridement;Dressing change   Wound Therapy - Current Recommendations PT   Wound Plan Continue with debridement and dressing change to wound   Remeasure next session.   Dressing  xeroform, vaseline, gauze and ABD pad                              Problem List Patient Active Problem List   Diagnosis Date Noted  . Rectal bleeding 07/28/2015  . Chronic anticoagulation 07/28/2015  . PVD (peripheral vascular disease) ([place])   . S/P angioplasty 04/23/2015  . Peripheral arterial occlusive disease ([place]) 04/23/2015  . PAD (peripheral artery disease) ([place])   . Critical lower limb ischemia   . Toe ulcer, right ([place])   . Type II or unspecified type diabetes mellitus without mention of complication, uncontrolled 11/27/2013  . Acute respiratory failure with hypoxia ([place]) 11/26/2013  . CAP (community acquired pneumonia) 11/26/2013  . CHF exacerbation ([place]) 11/25/2013  . Cardiomyopathy, ischemic 06/13/2013  . At risk for sudden cardiac death 06/12/13  . S/P cardiac  catheterization, Rt & Lt heart cath 06/05/2013 06/12/2013  . Acute on chronic combined systolic and diastolic HF (heart failure), NYHA class 3 ([place]) 06/04/2013  . Atrial flutter with rapid ventricular response ([place]) 06/02/2013  . Acute renal failure: Cr up to 1.8 06/02/2013  . Hyperkalemia 06/02/2013    Class: Acute  . LBBB (left bundle branch block) 06/01/2013  . Hypotension- secondary to AF and Diltiazem Rx 06/01/2013  . Obesity (BMI 30-39.9) 06/01/2013  . Acute on chronic diastolic heart failure - due to Afib AVR 06/01/2013  . Atrial fibrillation with RVR- converted with Amiodarone 05/31/2013  . Stroke- Lt brain 2003 04/08/2013  . Hemiplegia affecting right dominant side ([place]) 04/08/2013  . Cataract 04/08/2013  . GERD (gastroesophageal reflux disease) 04/08/2013  . S/P CABG x 3 - 2010 04/08/2013  . S/P AVR-tissue, 2010 04/08/2013  . DM2 (diabetes mellitus, type 2) ([place]) 04/08/2013  . Dyslipidemia 04/08/2013  . SUBACROMIAL BURSITIS, LEFT 11/05/2009    Teena Irani, PTA/CLT [phone]  11/10/2015, 10:16 AM  [place]  [facility] [address] [place], [name], [address] Phone: [phone]   Fax:  [phone]  Name: [name] MRN: [id number] Date of Birth: 1946-05-31

## 2015-11-11 DIAGNOSIS — Z1389 Encounter for screening for other disorder: Secondary | ICD-10-CM | POA: Diagnosis not present

## 2015-11-11 DIAGNOSIS — R6889 Other general symptoms and signs: Secondary | ICD-10-CM | POA: Diagnosis not present

## 2015-11-11 DIAGNOSIS — J029 Acute pharyngitis, unspecified: Secondary | ICD-10-CM | POA: Diagnosis not present

## 2015-11-11 DIAGNOSIS — B349 Viral infection, unspecified: Secondary | ICD-10-CM | POA: Diagnosis not present

## 2015-11-12 ENCOUNTER — Telehealth (HOSPITAL_COMMUNITY): Payer: Self-pay

## 2015-11-12 ENCOUNTER — Ambulatory Visit (HOSPITAL_COMMUNITY): Payer: Medicare Other | Admitting: Physical Therapy

## 2015-11-12 NOTE — Telephone Encounter (Signed)
Patient has the flu.

## 2015-11-17 ENCOUNTER — Ambulatory Visit (HOSPITAL_COMMUNITY): Payer: Medicare Other | Attending: Orthopaedic Surgery | Admitting: Physical Therapy

## 2015-11-17 DIAGNOSIS — R262 Difficulty in walking, not elsewhere classified: Secondary | ICD-10-CM | POA: Diagnosis not present

## 2015-11-17 DIAGNOSIS — S91109D Unspecified open wound of unspecified toe(s) without damage to nail, subsequent encounter: Secondary | ICD-10-CM | POA: Diagnosis not present

## 2015-11-17 DIAGNOSIS — R2681 Unsteadiness on feet: Secondary | ICD-10-CM | POA: Insufficient documentation

## 2015-11-17 DIAGNOSIS — L97513 Non-pressure chronic ulcer of other part of right foot with necrosis of muscle: Secondary | ICD-10-CM | POA: Diagnosis not present

## 2015-11-17 DIAGNOSIS — S81801D Unspecified open wound, right lower leg, subsequent encounter: Secondary | ICD-10-CM | POA: Insufficient documentation

## 2015-11-17 NOTE — Therapy (Signed)
[place], [name], [address] Phone: [phone]   Fax:  [phone]  Wound Care Patient Details  Name: [name] MRN: [id number] Date of Birth: August 22, 1945 No Data Recorded  Encounter Date: 11/17/2015      PT End of Session - 11/17/15 1300    Visit Number 3   Number of Visits 71   Date for PT Re-Evaluation 11/21/15   Authorization Type [employer] Medicare: Gcode and reassessment complete visit 26   Authorization - Visit Number 15   Authorization - Number of Visits 67   PT Start Time 1150   PT Stop Time 1230   PT Time Calculation (min) 40 min   Activity Tolerance Patient tolerated treatment well   Behavior During Therapy WFL for tasks assessed/performed      Past Medical History  Diagnosis Date  . Hypertension   . Diabetes mellitus     type 2   . History of valve replacement 2010    [facility] Ease bioprosthetic 39m  . Thyroid disease   . Stroke ([facility] 2003    R-sided weakness & upper extremity swelling   . Coronary artery disease   . Dyslipidemia   . At risk for sudden cardiac death 12014/11/16 . PAF (paroxysmal atrial fibrillation) ([place] 05/2013    converted with amiodarone to SR  . Chronic anticoagulation 05/2013    started  . S/P cardiac catheterization, Rt & Lt heart cath 06/05/2013 12014-11-16 . S/P CABG x 3 2010  . GERD (gastroesophageal reflux disease)   . Cataract   . Chronic kidney disease     Patient reports he is seeing Kidney doctor in September    Past Surgical History  Procedure Laterality Date  . Coronary artery bypass graft  01/26/2009    LIMA to LAD, SVG to circumflex, SVG to PDA  . [facility]ortic [facility]  [facility]  . Transthoracic echocardiogram  09/2011    grade 1 diastolic dysfunction; increasing valve gradient; calcified MV annulus   . Cardiac catheterization  06/05/2013    native 3 vessel disease; patent LIMA to LAD,  SVG to OM and SVG to PDA; Elevated right and left heart filling pressures, with predominantly pulmonary venous hypertension, normal PVR  . Transthoracic echocardiogram  06/03/2013    EF 25-30%, mod conc. hypertrophy, grade 3 diastolic dysfunction; LA mod dilated; calcified MV annulus; transaortic gradients are normal for the bioprosthetic valve; inf vena cava dilated (elevated CVP) - LifeVest  . Left and right heart catheterization with coronary angiogram N/A 06/05/2013    Procedure: LEFT AND RIGHT HEART CATHETERIZATION WITH CORONARY ANGIOGRAM;  Surgeon: DaLeonie ManMD;  [facility];  Service: Cardiovascular;  Laterality: N/A;  . Graft(s) angiogram  06/05/2013    Procedure: GRAFT(S) ANCyril Loosen Surgeon: DaLeonie ManMD;  [facility];  Service: Cardiovascular;;  . Colonoscopy  2007    Dr. MaAviva Signsinternal hemorrhoids    There were no vitals filed for this visit.  Visit Diagnosis:  Open toe wound, subsequent encounter  Difficulty walking  Unsteadiness         Wound Therapy - 11/17/15 1253    Subjective Pt states he is suppose to be moving on Monday April 3rd.  States MD is pleased with improvements in toe.  No pain but increased sensation.   Patient and Family Stated Goals wound to heal    Pain  Assessment No/denies pain   Wound Properties Date First Assessed: 06/23/15 Time First Assessed: 0900 Wound Type: Other (Comment) Location: Toe (Comment  which one) Location Orientation: Right Wound Description (Comments): medial  aspect of Rt great toe Present on Admission: No   Dressing Type Gauze (Comment);Impregnated gauze (bismuth)  this is dressing removed see dressing for new dressing    Dressing Changed Changed   Dressing Status Old drainage   Dressing Change Frequency Every 3 days   Site / Wound Assessment Friable;Granulation tissue;Pale   % Wound base Red or Granulating 70%   % Wound base Yellow 30%   Peri-wound Assessment Edema;Erythema  (blanchable)   Margins Epibole (rolled edges)  partial attached edges 1:00-4:30   Drainage Amount Minimal   Drainage Description Serosanguineous;No odor   Treatment Cleansed;Debridement (Selective)   Incision Properties Date First Assessed: 04/23/15 Time First Assessed: 1738 Location: Groin Location Orientation: Left Present on Admission: No   Selective Debridement - Location wound bed and edges of wound   Selective Debridement - Tools Used Forceps;Scissors   Selective Debridement - Tissue Removed slough and callous    Wound Therapy - Clinical Statement Decreased maceration but wound tissue is over wound bed needing compression to heal.  Therapist will try tegaderm with pressure followed by medipore tape to attmept to get tissue even with wound bed .   Wound Therapy - Functional Problem List nonhealing wound with difficulty wearing shoes.    Factors Delaying/Impairing Wound Healing Altered sensation;Diabetes Mellitus;Infection - systemic/local;Multiple medical problems;Polypharmacy;Vascular compromise   Hydrotherapy Plan Debridement;Dressing change   Wound Therapy - Current Recommendations PT   Wound Plan Continue with debridement and dressing change to wound   Remeasure next session.   Dressing  tegaderm followed by medipore tape.     Decrease Necrotic Tissue to 20%   Decrease Necrotic Tissue - Progress Progressing toward goal   Increase Granulation Tissue to 80%   Increase Granulation Tissue - Progress Progressing toward goal   Decrease Length/Width/Depth by (cm) 1.0   Decrease Length/Width/Depth - Progress Not met   Improve Drainage Characteristics Min   Improve Drainage Characteristics - Progress Progressing toward goal   Patient/Family will be able to  independent in wound dressing   Patient/Family Instruction Goal - Progress Revised due to lack of progress  Pt lives alone and has had a stroke he will not be able    Additional Wound Therapy Goal full healing   Additional Wound  Therapy Goal - Progress Not met                                    Problem List Patient Active Problem List   Diagnosis Date Noted  . Rectal bleeding 07/28/2015  . Chronic anticoagulation 07/28/2015  . PVD (peripheral vascular disease) ([place])   . S/P angioplasty 04/23/2015  . Peripheral arterial occlusive disease ([place]) 04/23/2015  . PAD (peripheral artery disease) ([place])   . Critical lower limb ischemia   . Toe ulcer, right ([place])   . Type II or unspecified type diabetes mellitus without mention of complication, uncontrolled 11/27/2013  . Acute respiratory failure with hypoxia ([place]) 11/26/2013  . CAP (community acquired pneumonia) 11/26/2013  . CHF exacerbation ([place]) 11/25/2013  . Cardiomyopathy, ischemic 06/13/2013  . At risk for sudden cardiac death 06-26-2013  . S/P cardiac catheterization, Rt & Lt heart cath 06/05/2013 06/26/13  . Acute on chronic combined systolic and diastolic HF (heart failure),  NYHA class 3 ([place]) 06/04/2013  . Atrial flutter with rapid ventricular response ([place]) 06/02/2013  . Acute renal failure: Cr up to 1.8 06/02/2013  . Hyperkalemia 06/02/2013    Class: Acute  . LBBB (left bundle branch block) 06/01/2013  . Hypotension- secondary to AF and Diltiazem Rx 06/01/2013  . Obesity (BMI 30-39.9) 06/01/2013  . Acute on chronic diastolic heart failure - due to Afib AVR 06/01/2013  . Atrial fibrillation with RVR- converted with Amiodarone 05/31/2013  . Stroke- Lt brain 2003 04/08/2013  . Hemiplegia affecting right dominant side ([place]) 04/08/2013  . Cataract 04/08/2013  . GERD (gastroesophageal reflux disease) 04/08/2013  . S/P CABG x 3 - 2010 04/08/2013  . S/P AVR-tissue, 2010 04/08/2013  . DM2 (diabetes mellitus, type 2) ([place]) 04/08/2013  . Dyslipidemia 04/08/2013  . SUBACROMIAL BURSITIS, LEFT 11/05/2009    Rayetta Humphrey, PT CLT [phone] 11/17/2015, 1:01 PM  [place] [address] [place], [name], [address] Phone: [phone]   Fax:  [phone]  Name: TRAVIUS CROCHET MRN: [id number] Date of Birth: 11-Aug-1946

## 2015-11-19 ENCOUNTER — Encounter: Payer: Self-pay | Admitting: Licensed Clinical Social Worker

## 2015-11-19 ENCOUNTER — Other Ambulatory Visit: Payer: Self-pay | Admitting: Licensed Clinical Social Worker

## 2015-11-19 NOTE — Patient Outreach (Signed)
Assessment:  CSW called client on 11/19/15 and spoke via phone with client. CSW verified client identity. CSW and client spoke of client needs. Client said he is eating adequately and sleeping adequately. He said he continued to drive himself to scheduled client medical appointments. He speaks regularly with his sister as a means of family support. He is going to all of his scheduled medical appointments. He sees Dr. Lawrence Fusco as his primary care physician.  He is currently receiving [facility] nursing support with [facility] Coach Dionne Leath.  CSW informed [name] that client had met his care plan goals with CSW services. Client said he did not have any further social work needs at present. CSW informed [name] that since client had met his care plan goals with CSW services, that CSW would discharge client from [facility] CSW services on 11/19/15. Client agreed to this plan. CSW congratulated client on reaching his care plan goals with CSW services. Client understands that he will continue to receive telephonic support with [facility] Health Coach Dionne Leath.   Plan:  CSW is discharging [name] from [facility] CSW services only on 11/19/15 since client has met his care plan goals with CSW services. CSW to inform Nicole Robinson that CSW discharged client on 11/19/15 from [facility] CSW services only. CSW to fax physician case closure letter to Dr. Fusco informing Dr. Fusco that CSW discharged client on 11/19/15 from [facility] CSW services only.    [name] MSW, LCSW Licensed Clinical Social Worker [facility] [phone] 

## 2015-11-20 ENCOUNTER — Ambulatory Visit (HOSPITAL_COMMUNITY): Payer: Medicare Other | Admitting: Physical Therapy

## 2015-11-20 DIAGNOSIS — R2681 Unsteadiness on feet: Secondary | ICD-10-CM

## 2015-11-20 DIAGNOSIS — R262 Difficulty in walking, not elsewhere classified: Secondary | ICD-10-CM | POA: Diagnosis not present

## 2015-11-20 DIAGNOSIS — S91109D Unspecified open wound of unspecified toe(s) without damage to nail, subsequent encounter: Secondary | ICD-10-CM | POA: Diagnosis not present

## 2015-11-20 DIAGNOSIS — S81801D Unspecified open wound, right lower leg, subsequent encounter: Secondary | ICD-10-CM | POA: Diagnosis not present

## 2015-11-20 DIAGNOSIS — L97513 Non-pressure chronic ulcer of other part of right foot with necrosis of muscle: Secondary | ICD-10-CM | POA: Diagnosis not present

## 2015-11-20 NOTE — Therapy (Signed)
[place], [name], [address] Phone: [phone]   Fax:  [phone]  Wound Care Therapy  Patient Details  Name: [name] MRN: [id number] Date of Birth: 18-Aug-1945 No Data Recorded  Encounter Date: 11/20/2015      PT End of Session - 11/20/15 1639    Visit Number 30   Number of Visits 53   Date for PT Re-Evaluation 12/20/15   Authorization - Visit Number 13   Authorization - Number of Visits 44   PT Start Time 6811   PT Stop Time 1515   PT Time Calculation (min) 40 min   Activity Tolerance Patient tolerated treatment well      Past Medical History  Diagnosis Date  . Hypertension   . Diabetes mellitus     type 2   . History of valve replacement 2010    [facility] Ease bioprosthetic 63m  . Thyroid disease   . Stroke ([facility] 2003    R-sided weakness & upper extremity swelling   . Coronary artery disease   . Dyslipidemia   . At risk for sudden cardiac death 113-Nov-2014 . PA[place]) [place]    converted with amiodarone to SR  . Chronic anticoagulation 05/2013    started  . S/P cardiac catheterization, Rt & Lt heart cath 06/05/2013 111/13/14 . S/P CABG x 3 2010  . GERD (gastroesophageal reflux disease)   . Cataract   . Chronic kidney disease     Patient reports he is seeing Kidney doctor in September    Past Surgical History  Procedure Laterality Date  . Coronary artery bypass graft  01/26/2009    LIMA to LAD, SVG to circumflex, SVG to PDA  . [facility]  [facility]  . Transthoracic echocardiogram  09/2011    grade 1 diastolic dysfunction; increasing valve gradient; calcified MV annulus   . Cardiac catheterization  06/05/2013    native 3 vessel disease; patent LIMA to LAD, SVG to OM and SVG to PDA; Elevated right and left heart filling pressures, with predominantly pulmonary venous hypertension,  normal PVR  . Transthoracic echocardiogram  06/03/2013    EF 25-30%, mod conc. hypertrophy, grade 3 diastolic dysfunction; LA mod dilated; calcified MV annulus; transaortic gradients are normal for the bioprosthetic valve; inf vena cava dilated (elevated CVP) - LifeVest  . Left and right heart catheterization with coronary angiogram N/A 06/05/2013    Procedure: LEFT AND RIGHT HEART CATHETERIZATION WITH CORONARY ANGIOGRAM;  Surgeon: DaLeonie ManMD;  [facility];  Service: Cardiovascular;  Laterality: N/A;  . Graft(s) angiogram  06/05/2013    Procedure: GRAFT(S) ANCyril Loosen Surgeon: DaLeonie ManMD;  [facility];  Service: Cardiovascular;;  . Colonoscopy  2007    Dr. MaAviva Signsinternal hemorrhoids    There were no vitals filed for this visit.                  Wound Therapy - 11/20/15 1634    Subjective Pt is stiff today but overall feeling better.    Patient and Family Stated Goals wound to heal    Pain Assessment No/denies pain   Wound Properties Date First Assessed: 06/23/15 Time First Assessed: 0900 Wound Type: Other (Comment) Location: Toe (Comment  which one) Location Orientation: Right Wound Description (Comments): medial  aspect of Rt great toe Present on Admission: No  Dressing Type Opsite Post-Op Visible  this is dressing removed see dressing for new dressing    Dressing Changed Changed   Dressing Status Old drainage   Dressing Change Frequency Every 3 days   Site / Wound Assessment Granulation tissue;Pale   % Wound base Red or Granulating 80%  was 50%   % Wound base Yellow 20%  was 50%   Peri-wound Assessment Erythema (blanchable)  slight    Wound Length (cm) 1.5 cm  was 1.5    Wound Width (cm) 1.2 cm  was 1.5    Wound Depth (cm) --  wound hypertrophic    Margins Epibole (rolled edges)  partial attached edges 1:00-4:30   Drainage Amount Minimal   Drainage Description Serosanguineous;No odor   Treatment  Cleansed;Debridement (Selective)   Incision Properties Date First Assessed: 04/23/15 Time First Assessed: 1738 Location: Groin Location Orientation: Left Present on Admission: No   Selective Debridement - Location wound bed and edges of wound   Selective Debridement - Tools Used Forceps;Scissors   Selective Debridement - Tissue Removed slough and callous    Wound Therapy - Clinical Statement Tegaderm appears to have lowered wound bed will continue with this followed by taping to reinforce pressure.     Wound Therapy - Functional Problem List nonhealing wound with difficulty wearing shoes.    Factors Delaying/Impairing Wound Healing Altered sensation;Diabetes Mellitus;Infection - systemic/local;Multiple medical problems;Polypharmacy;Vascular compromise   Hydrotherapy Plan Debridement;Dressing change   Wound Therapy - Current Recommendations PT   Wound Plan Continue with debridement and dressing change to wound   Remeasure next session.   Dressing  tegaderm followed by medipore tape.     Decrease Necrotic Tissue to 20%   Decrease Necrotic Tissue - Progress Progressing toward goal   Increase Granulation Tissue to 80%   Increase Granulation Tissue - Progress Progressing toward goal   Decrease Length/Width/Depth by (cm) 1.0   Decrease Length/Width/Depth - Progress Not progressing   Improve Drainage Characteristics Min   Improve Drainage Characteristics - Progress Progressing toward goal   Patient/Family will be able to  independent in wound dressing   Patient/Family Instruction Goal - Progress --  stop this goal    Additional Wound Therapy Goal full healing   Additional Wound Therapy Goal - Progress Not met                           Patient will benefit from skilled therapeutic intervention in order to improve the following deficits and impairments:     Visit Diagnosis: Open toe wound, subsequent encounter  Difficulty in walking, not elsewhere  classified  Unsteadiness on feet     Problem List Patient Active Problem List   Diagnosis Date Noted  . Rectal bleeding 07/28/2015  . Chronic anticoagulation 07/28/2015  . PVD (peripheral vascular disease) ([place])   . S/P angioplasty 04/23/2015  . Peripheral arterial occlusive disease ([place]) 04/23/2015  . PAD (peripheral artery disease) ([place])   . Critical lower limb ischemia   . Toe ulcer, right ([place])   . Type II or unspecified type diabetes mellitus without mention of complication, uncontrolled 11/27/2013  . Acute respiratory failure with hypoxia ([place]) 11/26/2013  . CAP (community acquired pneumonia) 11/26/2013  . CHF exacerbation ([place]) 11/25/2013  . Cardiomyopathy, ischemic 06/13/2013  . At risk for sudden cardiac death Jun 26, 2013  . S/P cardiac catheterization, Rt & Lt heart cath 06/05/2013 26-Jun-2013  . Acute on chronic combined systolic and diastolic HF (heart failure), NYHA  class 3 ([place]) 06/04/2013  . Atrial flutter with rapid ventricular response ([place]) 06/02/2013  . Acute renal failure: Cr up to 1.8 06/02/2013  . Hyperkalemia 06/02/2013    Class: Acute  . LBBB (left bundle branch block) 06/01/2013  . Hypotension- secondary to AF and Diltiazem Rx 06/01/2013  . Obesity (BMI 30-39.9) 06/01/2013  . Acute on chronic diastolic heart failure - due to Afib AVR 06/01/2013  . Atrial fibrillation with RVR- converted with Amiodarone 05/31/2013  . Stroke- Lt brain 2003 04/08/2013  . Hemiplegia affecting right dominant side ([place]) 04/08/2013  . Cataract 04/08/2013  . GERD (gastroesophageal reflux disease) 04/08/2013  . S/P CABG x 3 - 2010 04/08/2013  . S/P AVR-tissue, 2010 04/08/2013  . DM2 (diabetes mellitus, type 2) ([place]) 04/08/2013  . Dyslipidemia 04/08/2013  . SUBACROMIAL BURSITIS, LEFT 11/05/2009    Rayetta Humphrey, PT CLT [phone]  11/20/2015, 4:43 PM  [place] [address] [place], [name], [address] Phone: [phone]    Fax:  [phone]  Name: [name] MRN: [id number] Date of Birth: [birth year]

## 2015-11-24 ENCOUNTER — Telehealth (HOSPITAL_COMMUNITY): Payer: Self-pay | Admitting: Physical Therapy

## 2015-11-24 ENCOUNTER — Ambulatory Visit (HOSPITAL_COMMUNITY): Payer: Medicare Other | Admitting: Physical Therapy

## 2015-11-24 DIAGNOSIS — S91109D Unspecified open wound of unspecified toe(s) without damage to nail, subsequent encounter: Secondary | ICD-10-CM

## 2015-11-24 DIAGNOSIS — R2681 Unsteadiness on feet: Secondary | ICD-10-CM

## 2015-11-24 DIAGNOSIS — L97513 Non-pressure chronic ulcer of other part of right foot with necrosis of muscle: Secondary | ICD-10-CM | POA: Diagnosis not present

## 2015-11-24 DIAGNOSIS — R262 Difficulty in walking, not elsewhere classified: Secondary | ICD-10-CM | POA: Diagnosis not present

## 2015-11-24 DIAGNOSIS — S81801D Unspecified open wound, right lower leg, subsequent encounter: Secondary | ICD-10-CM | POA: Diagnosis not present

## 2015-11-24 NOTE — Telephone Encounter (Signed)
Pt did not show for sceduled appt.  Left message on voice mail and informed of next appt.  Teena Irani, PTA/CLT [phone]

## 2015-11-24 NOTE — Therapy (Signed)
[place], [name], [address] Phone: [phone]   Fax:  [phone]  Wound Care Therapy  Patient Details  Name: [name] MRN: [id number] Date of Birth: 1946/05/22 No Data Recorded  Encounter Date: 11/24/2015      PT End of Session - 11/24/15 1028    Visit Number 54   Number of Visits 3   Date for PT Re-Evaluation 12/20/15   Authorization - Visit Number 76   Authorization - Number of Visits 37   PT Start Time 0955   PT Stop Time 1020   PT Time Calculation (min) 25 min   Activity Tolerance Patient tolerated treatment well      Past Medical History  Diagnosis Date  . Hypertension   . Diabetes mellitus     type 2   . History of valve replacement 2010    [facility] Ease bioprosthetic 74mm  . Thyroid disease   . Stroke [facility]) 2003    R-sided weakness & upper extremity swelling   . Coronary artery disease   . Dyslipidemia   . At risk for sudden cardiac death 06-10-13  . PAF (paroxysmal atrial fibrillation) ([place]) 05/2013    converted with amiodarone to SR  . Chronic anticoagulation 05/2013    started  . S/P cardiac catheterization, Rt & Lt heart cath 06/05/2013 2013/06/10  . S/P CABG x 3 2010  . GERD (gastroesophageal reflux disease)   . Cataract   . Chronic kidney disease     Patient reports he is seeing Kidney doctor in September    Past Surgical History  Procedure Laterality Date  . Coronary artery bypass graft  01/26/2009    LIMA to LAD, SVG to circumflex, SVG to PDA  . Aortic valve replacement  01/26/2009    [facility] Ease bioprosthetic 71mm valve  . Transthoracic echocardiogram  09/2011    grade 1 diastolic dysfunction; increasing valve gradient; calcified MV annulus   . Cardiac catheterization  06/05/2013    native 3 vessel disease; patent LIMA to LAD, SVG to OM and SVG to PDA; Elevated right and left heart filling pressures, with predominantly pulmonary venous hypertension,  normal PVR  . Transthoracic echocardiogram  06/03/2013    EF 25-30%, mod conc. hypertrophy, grade 3 diastolic dysfunction; LA mod dilated; calcified MV annulus; transaortic gradients are normal for the bioprosthetic valve; inf vena cava dilated (elevated CVP) - LifeVest  . Left and right heart catheterization with coronary angiogram N/A 06/05/2013    Procedure: LEFT AND RIGHT HEART CATHETERIZATION WITH CORONARY ANGIOGRAM;  Surgeon: Leonie Man, MD;  Location: [facility] CATH LAB;  Service: Cardiovascular;  Laterality: N/A;  . Graft(s) angiogram  06/05/2013    Procedure: GRAFT(S) Cyril Loosen;  Surgeon: Leonie Man, MD;  Location: [facility] CATH LAB;  Service: Cardiovascular;;  . Colonoscopy  2007    Dr. Aviva Signs: internal hemorrhoids    There were no vitals filed for this visit.                  Wound Therapy - 11/24/15 1021    Subjective Pt did not show for earlier appointment but came in later to see if he could be seen.  Pt reports no pain today but bandage has slipped and drained through   Pain Assessment No/denies pain   Wound Properties Date First Assessed: 06/23/15 Time First Assessed: 0900 Wound Type: Other (Comment) Location: Toe (Comment  which one) Location Orientation: Right Wound Description (Comments):  medial  aspect of Rt great toe Present on Admission: No   Dressing Type Tape dressing  only medipore was on wound and was soaked through   Dressing Changed Changed   Dressing Status Old drainage   Dressing Change Frequency Every 3 days   Site / Wound Assessment Granulation tissue;Pale   % Wound base Red or Granulating 80%   % Wound base Yellow 20%   Peri-wound Assessment Maceration   Margins Other (Comment)  attached in areas, unattached in others   Drainage Amount Moderate   Drainage Description Serosanguineous   Treatment Cleansed;Debridement (Selective)   Incision Properties Date First Assessed: 04/23/15 Time First Assessed: 1738 Location: Groin Location  Orientation: Left Present on Admission: No   Selective Debridement - Location edges of wound   Selective Debridement - Tools Used Scalpel   Selective Debridement - Tissue Removed macerated tissue, rolled edges   Wound Therapy - Clinical Statement wound extremely macerated today with obvious slippage of tegaderm and soaked medipore tape dangling from toe.  cleansed and debrided inferior edges of wound where rolled,  superior edge is attached.  Used silver hydrofiber, toe bandage and medipore tape f/b #1 netting.  This will provide adequate absorption of drainage to prevent maceration and provide compression for approximation.    Hydrotherapy Plan Debridement;Dressing change   Wound Plan Continue with debridement and dressing change to wound                             Patient will benefit from skilled therapeutic intervention in order to improve the following deficits and impairments:     Visit Diagnosis: Open toe wound, subsequent encounter  Difficulty in walking, not elsewhere classified  Unsteadiness on feet     Problem List Patient Active Problem List   Diagnosis Date Noted  . Rectal bleeding 07/28/2015  . Chronic anticoagulation 07/28/2015  . PVD (peripheral vascular disease) ([place])   . S/P angioplasty 04/23/2015  . Peripheral arterial occlusive disease ([place]) 04/23/2015  . PAD (peripheral artery disease) ([place])   . Critical lower limb ischemia   . Toe ulcer, right ([place])   . Type II or unspecified type diabetes mellitus without mention of complication, uncontrolled 11/27/2013  . Acute respiratory failure with hypoxia ([place]) 11/26/2013  . CAP (community acquired pneumonia) 11/26/2013  . CHF exacerbation ([place]) 11/25/2013  . Cardiomyopathy, ischemic 06/13/2013  . At risk for sudden cardiac death 06/26/13  . S/P cardiac catheterization, Rt & Lt heart cath 06/05/2013 June 26, 2013  . Acute on chronic combined systolic and diastolic HF (heart failure), NYHA class  3 ([place]) 06/04/2013  . Atrial flutter with rapid ventricular response ([place]) 06/02/2013  . Acute renal failure: Cr up to 1.8 06/02/2013  . Hyperkalemia 06/02/2013    Class: Acute  . LBBB (left bundle branch block) 06/01/2013  . Hypotension- secondary to AF and Diltiazem Rx 06/01/2013  . Obesity (BMI 30-39.9) 06/01/2013  . Acute on chronic diastolic heart failure - due to Afib AVR 06/01/2013  . Atrial fibrillation with RVR- converted with Amiodarone 05/31/2013  . Stroke- Lt brain 2003 04/08/2013  . Hemiplegia affecting right dominant side ([place]) 04/08/2013  . Cataract 04/08/2013  . GERD (gastroesophageal reflux disease) 04/08/2013  . S/P CABG x 3 - 2010 04/08/2013  . S/P AVR-tissue, 2010 04/08/2013  . DM2 (diabetes mellitus, type 2) ([place]) 04/08/2013  . Dyslipidemia 04/08/2013  . SUBACROMIAL BURSITIS, LEFT 11/05/2009    Teena Irani, PTA/CLT [phone]  11/24/2015,  10:30 AM  [place], [name], [address] Phone: [phone]   Fax:  [phone]  Name: [name] MRN: [id number] Date of Birth: Mar 20, 1946

## 2015-11-25 ENCOUNTER — Other Ambulatory Visit: Payer: Self-pay

## 2015-11-25 NOTE — Patient Outreach (Signed)
[facility]) Care Management  [place]  11/25/2015   [name] Feb 24, 1946 [id number]  Subjective: Telephone call to patient for monthly call. Patient reports he is doing ok.  Getting over a cold.  He states he saw the PCP about 1 week ago for it and was given a shot.  Patient reports he feels much better.  He continues to go to the wound center for right toe wound.  Patient reports that it is slow healing but making some progress.  Patient reports he is doing better with his sugars and that this mornings blood sugar was 77.  He states his highest blood sugar in about the last 2 weeks was 140.  Patient reports he has changed in trying to eat less.  Discussed with patient how diet plays a part in wound healing and sugar control.  He verbalized understanding.  No concerns.      Objective:   Encounter Medications:  Outpatient Encounter Prescriptions as of 11/25/2015  Medication Sig Note  . acetaminophen (TYLENOL) 500 MG tablet Take 1,000 mg by mouth 2 (two) times daily as needed for headache. For pain/headaches   . ALPRAZolam (XANAX) 1 MG tablet Take 1 mg by mouth 4 (four) times daily as needed for anxiety.    Marland Kitchen amiodarone (PACERONE) 200 MG tablet TAKE ONE (1) TABLET EACH DAY   . atorvastatin (LIPITOR) 20 MG tablet Take 1 tablet (20 mg total) by mouth daily at 6 PM.   . celecoxib (CELEBREX) 200 MG capsule Take 1 capsule by mouth every evening.    . clopidogrel (PLAVIX) 75 MG tablet Take 1 tablet (75 mg total) by mouth daily. (Patient taking differently: Take 75 mg by mouth every evening. )   . docusate sodium (COLACE) 100 MG capsule Take 100 mg by mouth 2 (two) times daily.     Marland Kitchen ELIQUIS 5 MG TABS tablet TAKE ONE TABLET TWICE DAILY   . fenofibrate (TRICOR) 145 MG tablet Take 145 mg by mouth daily.   . ferrous sulfate 325 (65 FE) MG EC tablet Take 325 mg by mouth daily with breakfast.   . gabapentin (NEURONTIN) 300 MG capsule Take 300 mg by mouth 2 (two) times  daily.    Marland Kitchen HYDROcodone-acetaminophen (NORCO) 10-325 MG per tablet Take 0.5 tablets by mouth 2 (two) times daily.    . hydrocortisone (PROCTOSOL HC) 2.5 % rectal cream Place 1 application rectally 2 (two) times daily. For 10 days   . insulin glargine (LANTUS) 100 UNIT/ML injection Inject 60-75 Units into the skin 2 (two) times daily. Takes 75 units in the morning and 60 units in the evening   . insulin lispro (HUMALOG) 100 UNIT/ML injection Inject 35-50 Units into the skin 3 (three) times daily before meals.  02/11/2015: Takes 35 units with breakfast and 50 units with lunch and dinner  . levothyroxine (SYNTHROID, LEVOTHROID) 25 MCG tablet Take 25 mcg by mouth daily.     . metoprolol succinate (TOPROL-XL) 25 MG 24 hr tablet TAKE ONE (1) TABLET EACH DAY   . Omega-3 Fatty Acids (FISH OIL) 1200 MG CAPS Take 1,200 mg by mouth 2 (two) times daily.   . pantoprazole (PROTONIX) 40 MG tablet Take 1 tablet (40 mg total) by mouth 2 (two) times daily.   . silver sulfADIAZINE (SILVADENE) 1 % cream Apply 1 application topically 2 (two) times daily.    Marland Kitchen torsemide (DEMADEX) 10 MG tablet Take 1 tablet (10 mg total) by mouth daily.   Marland Kitchen  polyethylene glycol-electrolytes (NULYTELY/GOLYTELY) 420 G solution Take 4,000 mLs by mouth once. (Patient not taking: Reported on 10/07/2015)    No facility-administered encounter medications on file as of 11/25/2015.    Functional Status:  In your present state of health, do you have any difficulty performing the following activities: 09/18/2015 08/21/2015  Hearing? N N  Vision? N N  Difficulty concentrating or making decisions? N N  Walking or climbing stairs? Y -  Dressing or bathing? N N  Doing errands, shopping? - Scientist, forensic and eating ? - N  Using the Toilet? - N  In the past six months, have you accidently leaked urine? - N  Do you have problems with loss of bowel control? - N  Managing your Medications? - N  Managing your Finances? - N  Housekeeping or managing  your Housekeeping? - Y    Fall/Depression Screening: PHQ 2/9 Scores 11/25/2015 11/02/2015 09/30/2015 09/21/2015 08/25/2015 08/21/2015 07/30/2015  PHQ - 2 Score 0 2 2 2  0 0 0  PHQ- 9 Score - 7 7 7  - - -    Assessment: Patient continues to benefit from health coach outreach for disease management and support.    Plan:  [facility] CM Care Plan Problem Two        Most Recent Value   Care Plan Problem Two  Uncontrolled diabetes as evidenced by elevated CBGs, HGA1C and nonhealing wound.   Role Documenting the Problem Two  [place] for Problem Two  Active   Interventions for Problem Two Long Term Goal   RN Health Coach reviewed with patient signs of wound infection and importance of protein in his diet to promote healing.    THN Long Term Goal (31-90) days  Patient stated goal "I want my foot to heal", Patient foot will show improvement to wound over next 90 days   THN Long Term Goal Start Date  11/25/15 [Goal continued]   THN CM Short Term Goal #3 (0-30 days)  Patient CBG's will be under 200 within the next 30 days   THN CM Short Term Goal #3 Start Date  11/25/15 Barrie Folk continued]   Interventions for Short Term Goal #3  [place] reviewed with patient the importance of limiting carbohydrates and sweets.  Also reviewed goal of keeping sugars less than 200.        RN Health Coach will contact patient within one month and patient agrees to next outreach.  [name], RN, MSN [place] [phone]

## 2015-11-26 ENCOUNTER — Ambulatory Visit (HOSPITAL_COMMUNITY): Payer: Medicare Other

## 2015-11-26 DIAGNOSIS — R262 Difficulty in walking, not elsewhere classified: Secondary | ICD-10-CM

## 2015-11-26 DIAGNOSIS — R2681 Unsteadiness on feet: Secondary | ICD-10-CM

## 2015-11-26 DIAGNOSIS — S81801D Unspecified open wound, right lower leg, subsequent encounter: Secondary | ICD-10-CM

## 2015-11-26 NOTE — Therapy (Signed)
[place] [address] [place], [name], [address] Phone: [phone]   Fax:  [phone]  Physical Therapy Treatment  Patient Details  Name: [name] MRN: [id number] Date of Birth: [birth year] No Data Recorded  Encounter Date: 11/26/2015      PT End of Session - 11/26/15 1851    Visit Number 52   Number of Visits 76   Date for PT Re-Evaluation 12/20/15   Authorization Type [employer] Medicare: Gcode and reassessment complete visit 22   Authorization Time Period 04/22/15 to 06/21/05   Authorization - Visit Number 82   Authorization - Number of Visits 41   PT Start Time 0903   PT Stop Time 0936   PT Time Calculation (min) 33 min   Activity Tolerance Patient tolerated treatment well   Behavior During Therapy [facility] for tasks assessed/performed      Past Medical History  Diagnosis Date  . Hypertension   . Diabetes mellitus     type 2   . History of valve replacement 2010    [facility] Ease bioprosthetic 23mm  . Thyroid disease   . Stroke [facility]) 2003    R-sided weakness & upper extremity swelling   . Coronary artery disease   . Dyslipidemia   . At risk for sudden cardiac death Jun 21, 2013  . PAF (paroxysmal atrial fibrillation) ([place]) 05/2013    converted with amiodarone to SR  . Chronic anticoagulation 05/2013    started  . S/P cardiac catheterization, Rt & Lt heart cath 06/05/2013 June 21, 2013  . S/P CABG x 3 2010  . GERD (gastroesophageal reflux disease)   . Cataract   . Chronic kidney disease     Patient reports he is seeing Kidney doctor in September    Past Surgical History  Procedure Laterality Date  . Coronary artery bypass graft  01/26/2009    LIMA to LAD, SVG to circumflex, SVG to PDA  . Aortic valve replacement  01/26/2009    [facility] Ease bioprosthetic 80mm valve  . Transthoracic echocardiogram  09/2011    grade 1 diastolic dysfunction; increasing valve gradient; calcified MV annulus   . Cardiac  catheterization  06/05/2013    native 3 vessel disease; patent LIMA to LAD, SVG to OM and SVG to PDA; Elevated right and left heart filling pressures, with predominantly pulmonary venous hypertension, normal PVR  . Transthoracic echocardiogram  06/03/2013    EF 25-30%, mod conc. hypertrophy, grade 3 diastolic dysfunction; LA mod dilated; calcified MV annulus; transaortic gradients are normal for the bioprosthetic valve; inf vena cava dilated (elevated CVP) - LifeVest  . Left and right heart catheterization with coronary angiogram N/A 06/05/2013    Procedure: LEFT AND RIGHT HEART CATHETERIZATION WITH CORONARY ANGIOGRAM;  Surgeon: Leonie Man, MD;  Location: [facility] CATH LAB;  Service: Cardiovascular;  Laterality: N/A;  . Graft(s) angiogram  06/05/2013    Procedure: GRAFT(S) Cyril Loosen;  Surgeon: Leonie Man, MD;  Location[facility] C[facility] LAB;  Service: Cardiovascular;;  . Colonoscopy  2007    Dr. Aviva Signs: internal hemorrhoids    There were no vitals filed for this visit.      Subjective Assessment - 11/26/15 0937    Subjective Foot is feeling good today. reported sugar was very low today at 61 mmol/L   Pertinent History Patient reports that this wound started as a small little brown spot on the end of his toe that just kept getting bigger; he thinks it started about 8 months  ago. He reports that it is now slowly getting better but has been healing slow. He states  that it has been getting better.    Currently in Pain? No/denies               Wound Therapy - 11/26/15 0939    Subjective Foot is feeling good today. reported sugar was very low today at 61 mmol/L   Patient and Family Stated Goals wound to heal    Pain Assessment No/denies pain   Wound Properties Date First Assessed: 06/23/15 Time First Assessed: 0900 Wound Type: Other (Comment) Location: Toe (Comment  which one) Location Orientation: Right Wound Description (Comments): medial  aspect of Rt great toe Present on Admission:  No   Dressing Type Silver hydrofiber;Gauze (Comment);Tape dressing   Dressing Changed Changed   Dressing Status Old drainage   Dressing Change Frequency Every 3 days   Site / Wound Assessment Granulation tissue;Pale   % Wound base Red or Granulating 85%   % Wound base Yellow 15%   Peri-wound Assessment Intact   Wound Length (cm) 1.4 cm   Wound Width (cm) 1.1 cm   Margins Other (Comment)  approximated inferior, unattached superior edge   Drainage Amount Moderate   Drainage Description Serosanguineous   Treatment Cleansed;Debridement (Selective)   Incision Properties Date First Assessed: 04/23/15 Time First Assessed: 1738 Location: Groin Location Orientation: Left Present on Admission: No   Selective Debridement - Location edges of wound   Selective Debridement - Tools Used Forceps;Scalpel   Selective Debridement - Tissue Removed dry blood, rolled edges   Wound Therapy - Clinical Statement Maceration resolved.  Improved skin integrity perimeter of wound.  Selective debridement on inferior edges of wound to reduce rolling to increase approximation.  No reports of pain through session.  Continued with silver hydrofiber for absorption of drainage to prevent maceration with gauze and medipore tape for pressure to assist with approximation and reduce wound bed above skin surrounding for healing.     Wound Therapy - Functional Problem List nonhealing wound with difficulty wearing shoes.    Factors Delaying/Impairing Wound Healing Altered sensation;Diabetes Mellitus;Infection - systemic/local;Multiple medical problems;Polypharmacy;Vascular compromise   Hydrotherapy Plan Debridement;Dressing change   Wound Therapy - Frequency Other (comment)   Wound Therapy - Current Recommendations PT   Wound Plan Continue with debridement and dressing change to wound   [place] next session.          Patient will benefit from skilled therapeutic intervention in order to improve the following deficits and  impairments:     Visit Diagnosis: Difficulty in walking, not elsewhere classified  Unsteadiness on feet  Open wound of right lower leg with complication, subsequent encounter     Problem List Patient Active Problem List   Diagnosis Date Noted  . Rectal bleeding 07/28/2015  . Chronic anticoagulation 07/28/2015  . PVD (peripheral vascular disease) ([place])   . S/P angioplasty 04/23/2015  . Peripheral arterial occlusive disease ([place]) 04/23/2015  . PAD (peripheral artery disease) ([place])   . Critical lower limb ischemia   . Toe ulcer, right ([place])   . Type II or unspecified type diabetes mellitus without mention of complication, uncontrolled 11/27/2013  . Acute respiratory failure with hypoxia ([place]) 11/26/2013  . CAP (community acquired pneumonia) 11/26/2013  . CHF exacerbation ([place]) 11/25/2013  . Cardiomyopathy, ischemic 06/13/2013  . At risk for sudden cardiac death 06/08/13  . S/P cardiac catheterization, Rt & Lt heart cath 06/05/2013 June 08, 2013  . Acute on chronic combined systolic  and diastolic HF (heart failure), NYHA class 3 ([place]) 06/04/2013  . Atrial flutter with rapid ventricular response ([place]) 06/02/2013  . Acute renal failure: Cr up to 1.8 06/02/2013  . Hyperkalemia 06/02/2013    Class: Acute  . LBBB (left bundle branch block) 06/01/2013  . Hypotension- secondary to AF and Diltiazem Rx 06/01/2013  . Obesity (BMI 30-39.9) 06/01/2013  . Acute on chronic diastolic heart failure - due to Afib AVR 06/01/2013  . Atrial fibrillation with RVR- converted with Amiodarone 05/31/2013  . Stroke- Lt brain 2003 04/08/2013  . Hemiplegia affecting right dominant side ([place]) 04/08/2013  . Cataract 04/08/2013  . GERD (gastroesophageal reflux disease) 04/08/2013  . S/P CABG x 3 - 2010 04/08/2013  . S/P AVR-tissue, 2010 04/08/2013  . DM2 (diabetes mellitus, type 2) ([place]) 04/08/2013  . Dyslipidemia 04/08/2013  . SUBACROMIAL BURSITIS, LEFT 11/05/2009   Ihor Austin, LPTA;  [place] Rayetta Humphrey, PT CLT [place] [address] [place], [name], [address] Phone: [phone]   Fax:  [phone]  Name: MASSIE SILENCE MRN: [id number] Date of Birth: Nov 22, 1945

## 2015-12-01 ENCOUNTER — Ambulatory Visit (HOSPITAL_COMMUNITY): Payer: Medicare Other | Admitting: Physical Therapy

## 2015-12-01 DIAGNOSIS — R262 Difficulty in walking, not elsewhere classified: Secondary | ICD-10-CM | POA: Diagnosis not present

## 2015-12-01 DIAGNOSIS — R2681 Unsteadiness on feet: Secondary | ICD-10-CM | POA: Diagnosis not present

## 2015-12-01 DIAGNOSIS — S91109D Unspecified open wound of unspecified toe(s) without damage to nail, subsequent encounter: Secondary | ICD-10-CM | POA: Diagnosis not present

## 2015-12-01 DIAGNOSIS — S81801D Unspecified open wound, right lower leg, subsequent encounter: Secondary | ICD-10-CM | POA: Diagnosis not present

## 2015-12-01 DIAGNOSIS — L97513 Non-pressure chronic ulcer of other part of right foot with necrosis of muscle: Secondary | ICD-10-CM | POA: Diagnosis not present

## 2015-12-01 NOTE — Therapy (Signed)
[place], [name], [address] Phone: [phone]   Fax:  [phone]  Wound Care Therapy  Patient Details  Name: [name] MRN: [id number] Date of Birth: 09-13-1945 No Data Recorded  Encounter Date: 12/01/2015      PT End of Session - 12/01/15 0954    Visit Number 39   Number of Visits 53   Date for PT Re-Evaluation 12/20/15   Authorization Type UHC Medicare: Gcode and reassessment complete visit 47   Authorization Time Period 04/22/15 to 06/21/05   Authorization - Visit Number 57   Authorization - Number of Visits 77   PT Start Time 0825   PT Stop Time 0900   PT Time Calculation (min) 35 min   Activity Tolerance Patient tolerated treatment well   Behavior During Therapy [facility] for tasks assessed/performed      Past Medical History  Diagnosis Date  . Hypertension   . Diabetes mellitus     type 2   . History of valve replacement 2010    [facility] Ease bioprosthetic 30mm  . Thyroid disease   . Stroke [facility]) 2003    R-sided weakness & upper extremity swelling   . Coronary artery disease   . Dyslipidemia   . At risk for sudden cardiac death 2013-06-22  . PAF (paroxysmal atrial fibrillation) ([place]) 05/2013    converted with amiodarone to SR  . Chronic anticoagulation 05/2013    started  . S/P cardiac catheterization, Rt & Lt heart cath 06/05/2013 06/22/2013  . S/P CABG x 3 2010  . GERD (gastroesophageal reflux disease)   . Cataract   . Chronic kidney disease     Patient reports he is seeing Kidney doctor in September    Past Surgical History  Procedure Laterality Date  . Coronary artery bypass graft  01/26/2009    LIMA to LAD, SVG to circumflex, SVG to PDA  . Aortic valve replacement  01/26/2009    [facility] Ease bioprosthetic 30mm valve  . Transthoracic echocardiogram  09/2011    grade 1 diastolic dysfunction; increasing valve gradient; calcified MV annulus   . Cardiac catheterization   06/05/2013    native 3 vessel disease; patent LIMA to LAD, SVG to OM and SVG to PDA; Elevated right and left heart filling pressures, with predominantly pulmonary venous hypertension, normal PVR  . Transthoracic echocardiogram  06/03/2013    EF 25-30%, mod conc. hypertrophy, grade 3 diastolic dysfunction; LA mod dilated; calcified MV annulus; transaortic gradients are normal for the bioprosthetic valve; inf vena cava dilated (elevated CVP) - LifeVest  . Left and right heart catheterization with coronary angiogram N/A 06/05/2013    Procedure: LEFT AND RIGHT HEART CATHETERIZATION WITH CORONARY ANGIOGRAM;  Surgeon: Leonie Man, MD;  Location: [facility] CATH LAB;  Service: Cardiovascular;  Laterality: N/A;  . Graft(s) angiogram  06/05/2013    Procedure: GRAFT(S) Cyril Loosen;  Surgeon: Leonie Man, MD;  Location: [facility] CATH LAB;  Service: Cardiovascular;;  . Colonoscopy  2007    Dr. Aviva Signs: internal hemorrhoids    There were no vitals filed for this visit.                  Wound Therapy - 12/01/15 0946    Subjective Pt was late for appt.  STates he fell on friday and skinned up his Rt knee.  Pt reports no pain or difficulties.   Pain Assessment No/denies pain   Wound Properties Date First  Assessed: 06/23/15 Time First Assessed: 0900 Wound Type: Other (Comment) Location: Toe (Comment  which one) Location Orientation: Right Wound Description (Comments): medial  aspect of Rt great toe Present on Admission: No   Dressing Type Silver hydrofiber;Gauze (Comment);Tape dressing   Dressing Changed Changed   Dressing Status Old drainage   Dressing Change Frequency Every 3 days   Site / Wound Assessment Granulation tissue   % Wound base Red or Granulating 90%   % Wound base Yellow 10%   Peri-wound Assessment Maceration   Margins Unattached edges (unapproximated)   Drainage Amount Moderate   Drainage Description Serosanguineous   Treatment Cleansed;Debridement (Selective)   Incision  Properties Date First Assessed: 04/23/15 Time First Assessed: 1738 Location: Groin Location Orientation: Left Present on Admission: No   Selective Debridement - Location edges of wound   Selective Debridement - Tools Used Forceps;Scalpel;Scissors   Selective Debridement - Tissue Removed edges, maceration   Wound Therapy - Clinical Statement Wound macerated perimeter with continued hypergranulated wound bed.  Removed dead and macerated skin from perimeter of wound.  Continued use of silver hydrofiber with medipore placed tightly over one side to increase compression and decrease hypergranulation.    No reports of pain through session.  Covered with conform and secured with medipore tape.  Pt reported overall comfort.  If continued hypergranulation, may need to return to MD to get treated with silver nitrate stick.       Hydrotherapy Plan Debridement;Dressing change   Wound Therapy - Current Recommendations PT   Wound Plan Continue with debridement and dressing change to wound   Remeasure next session.                           Patient will benefit from skilled therapeutic intervention in order to improve the following deficits and impairments:     Visit Diagnosis: Difficulty in walking, not elsewhere classified  Unsteadiness on feet     Problem List Patient Active Problem List   Diagnosis Date Noted  . Rectal bleeding 07/28/2015  . Chronic anticoagulation 07/28/2015  . PVD (peripheral vascular disease) ([place])   . S/P angioplasty 04/23/2015  . Peripheral arterial occlusive disease ([place]) 04/23/2015  . PAD (peripheral artery disease) ([place])   . Critical lower limb ischemia   . Toe ulcer, right ([place])   . Type II or unspecified type diabetes mellitus without mention of complication, uncontrolled 11/27/2013  . Acute respiratory failure with hypoxia ([place]) 11/26/2013  . CAP (community acquired pneumonia) 11/26/2013  . CHF exacerbation ([place]) 11/25/2013  . Cardiomyopathy,  ischemic 06/13/2013  . At risk for sudden cardiac death 06/11/13  . S/P cardiac catheterization, Rt & Lt heart cath 06/05/2013 06/11/2013  . Acute on chronic combined systolic and diastolic HF (heart failure), NYHA class 3 ([place]) 06/04/2013  . Atrial flutter with rapid ventricular response ([place]) 06/02/2013  . Acute renal failure: Cr up to 1.8 06/02/2013  . Hyperkalemia 06/02/2013    Class: Acute  . LBBB (left bundle branch block) 06/01/2013  . Hypotension- secondary to AF and Diltiazem Rx 06/01/2013  . Obesity (BMI 30-39.9) 06/01/2013  . Acute on chronic diastolic heart failure - due to Afib AVR 06/01/2013  . Atrial fibrillation with RVR- converted with Amiodarone 05/31/2013  . Stroke- Lt brain 2003 04/08/2013  . Hemiplegia affecting right dominant side ([place]) 04/08/2013  . Cataract 04/08/2013  . GERD (gastroesophageal reflux disease) 04/08/2013  . S/P CABG x 3 - 2010 04/08/2013  . S/P  AVR-tissue, 2010 04/08/2013  . DM2 (diabetes mellitus, type 2) ([place]) 04/08/2013  . Dyslipidemia 04/08/2013  . SUBACROMIAL BURSITIS, LEFT 11/05/2009    Teena Irani, PTA/CLT [phone]  12/01/2015, 9:58 AM  [facility] [address] [place], [name], [address] Phone: [phone]   Fax:  [phone]  Name: [name] MRN: [id number] Date of Birth: 1946-02-14

## 2015-12-03 ENCOUNTER — Ambulatory Visit (HOSPITAL_COMMUNITY): Payer: Medicare Other | Admitting: Physical Therapy

## 2015-12-03 DIAGNOSIS — R262 Difficulty in walking, not elsewhere classified: Secondary | ICD-10-CM | POA: Diagnosis not present

## 2015-12-03 DIAGNOSIS — S81801D Unspecified open wound, right lower leg, subsequent encounter: Secondary | ICD-10-CM | POA: Diagnosis not present

## 2015-12-03 DIAGNOSIS — S91109D Unspecified open wound of unspecified toe(s) without damage to nail, subsequent encounter: Secondary | ICD-10-CM | POA: Diagnosis not present

## 2015-12-03 DIAGNOSIS — R2681 Unsteadiness on feet: Secondary | ICD-10-CM

## 2015-12-03 DIAGNOSIS — L97513 Non-pressure chronic ulcer of other part of right foot with necrosis of muscle: Secondary | ICD-10-CM | POA: Diagnosis not present

## 2015-12-03 NOTE — Therapy (Deleted)
[place], [name], [address] Phone: [phone]   Fax:  [phone]  Wound Care Therapy  Patient Details  Name: [name] MRN: [id number] Date of Birth: May 06, 1946 No Data Recorded  Encounter Date: 12/03/2015      PT End of Session - 12/03/15 1556    Visit Number 60   Number of Visits 64   Date for PT Re-Evaluation 12/20/15   Authorization Type UHC Medicare: Gcode and reassessment complete visit 35   Authorization Time Period 04/22/15 to 06/21/05   Authorization - Visit Number 78   Authorization - Number of Visits 61   Activity Tolerance Patient tolerated treatment well   Behavior During Therapy [facility] for tasks assessed/performed      Past Medical History  Diagnosis Date  . Hypertension   . Diabetes mellitus     type 2   . History of valve replacement 2010    [facility] Ease bioprosthetic 57mm  . Thyroid disease   . Stroke [facility]) 2003    R-sided weakness & upper extremity swelling   . Coronary artery disease   . Dyslipidemia   . At risk for sudden cardiac death 06-25-2013  . PAF (paroxysmal atrial fibrillation) ([place]) 05/2013    converted with amiodarone to SR  . Chronic anticoagulation 05/2013    started  . S/P cardiac catheterization, Rt & Lt heart cath 06/05/2013 June 25, 2013  . S/P CABG x 3 2010  . GERD (gastroesophageal reflux disease)   . Cataract   . Chronic kidney disease     Patient reports he is seeing Kidney doctor in September    Past Surgical History  Procedure Laterality Date  . Coronary artery bypass graft  01/26/2009    LIMA to LAD, SVG to circumflex, SVG to PDA  . Aortic valve replacement  01/26/2009    [facility] Ease bioprosthetic 77mm valve  . Transthoracic echocardiogram  09/2011    grade 1 diastolic dysfunction; increasing valve gradient; calcified MV annulus   . Cardiac catheterization  06/05/2013    native 3 vessel disease; patent LIMA to LAD, SVG to OM and SVG  to PDA; Elevated right and left heart filling pressures, with predominantly pulmonary venous hypertension, normal PVR  . Transthoracic echocardiogram  06/03/2013    EF 25-30%, mod conc. hypertrophy, grade 3 diastolic dysfunction; LA mod dilated; calcified MV annulus; transaortic gradients are normal for the bioprosthetic valve; inf vena cava dilated (elevated CVP) - LifeVest  . Left and right heart catheterization with coronary angiogram N/A 06/05/2013    Procedure: LEFT AND RIGHT HEART CATHETERIZATION WITH CORONARY ANGIOGRAM;  Surgeon: Leonie Man, MD;  Location: [facility] CATH LAB;  Service: Cardiovascular;  Laterality: N/A;  . Graft(s) angiogram  06/05/2013    Procedure: GRAFT(S) Cyril Loosen;  Surgeon: Leonie Man, MD;  Location: [facility] CATH LAB;  Service: Cardiovascular;;  . Colonoscopy  2007    Dr. Aviva Signs: internal hemorrhoids    There were no vitals filed for this visit.                  Wound Therapy - 12/03/15 1555    Subjective Pt wtihout pain or complaints.  Dressing intact   Pain Assessment No/denies pain   Wound Properties Date First Assessed: 06/23/15 Time First Assessed: 0900 Wound Type: Other (Comment) Location: Toe (Comment  which one) Location Orientation: Right Wound Description (Comments): medial  aspect of Rt great toe Present on Admission: No   Dressing  Type Silver hydrofiber;Gauze (Comment);Tape dressing   Dressing Changed Changed   Dressing Status Old drainage   Dressing Change Frequency Every 3 days   Site / Wound Assessment Granulation tissue   % Wound base Red or Granulating 90%   % Wound base Yellow 10%   Peri-wound Assessment Pink;Intact   Margins Unattached edges (unapproximated)   Drainage Amount Moderate   Drainage Description Serosanguineous   Treatment Cleansed;Debridement (Selective)   Incision Properties Date First Assessed: 04/23/15 Time First Assessed: 1738 Location: Groin Location Orientation: Left Present on Admission: No   Selective  Debridement - Location edges of wound   Selective Debridement - Tools Used Forceps;Scalpel;Scissors   Selective Debridement - Tissue Removed edges, maceration   Wound Therapy - Clinical Statement Much improved integrity of perimeter of wound today.  No maceration and reduced hypergranulation of wound since using tape to compress down.  Removed dead skin from perimeter of wound.  Continued use of silver hydrofiber with medipore placed tightly over one side to increase compression and decrease hypergranulation.    No reports of pain through session.  Covered with conform and secured with medipore tape.  Pt reported overall comfort.  If continued hypergranulation, may need to return to MD to get treated with silver nitrate stick.       Hydrotherapy Plan Debridement;Dressing change   Wound Therapy - Current Recommendations PT   Wound Plan Continue with debridement and dressing change to wound.  Gcodes next session.                           Patient will benefit from skilled therapeutic intervention in order to improve the following deficits and impairments:     Visit Diagnosis: Difficulty in walking, not elsewhere classified  Unsteadiness on feet  Open wound of right lower leg with complication, subsequent encounter     Problem List Patient Active Problem List   Diagnosis Date Noted  . Rectal bleeding 07/28/2015  . Chronic anticoagulation 07/28/2015  . PVD (peripheral vascular disease) ([place])   . S/P angioplasty 04/23/2015  . Peripheral arterial occlusive disease ([place]) 04/23/2015  . PAD (peripheral artery disease) ([place])   . Critical lower limb ischemia   . Toe ulcer, right ([place])   . Type II or unspecified type diabetes mellitus without mention of complication, uncontrolled 11/27/2013  . Acute respiratory failure with hypoxia ([place]) 11/26/2013  . CAP (community acquired pneumonia) 11/26/2013  . CHF exacerbation ([place]) 11/25/2013  . Cardiomyopathy, ischemic 06/13/2013   . At risk for sudden cardiac death June 13, 2013  . S/P cardiac catheterization, Rt & Lt heart cath 06/05/2013 06-13-13  . Acute on chronic combined systolic and diastolic HF (heart failure), NYHA class 3 ([place]) 06/04/2013  . Atrial flutter with rapid ventricular response ([place]) 06/02/2013  . Acute renal failure: Cr up to 1.8 06/02/2013  . Hyperkalemia 06/02/2013    Class: Acute  . LBBB (left bundle branch block) 06/01/2013  . Hypotension- secondary to AF and Diltiazem Rx 06/01/2013  . Obesity (BMI 30-39.9) 06/01/2013  . Acute on chronic diastolic heart failure - due to Afib AVR 06/01/2013  . Atrial fibrillation with RVR- converted with Amiodarone 05/31/2013  . Stroke- Lt brain 2003 04/08/2013  . Hemiplegia affecting right dominant side ([place]) 04/08/2013  . Cataract 04/08/2013  . GERD (gastroesophageal reflux disease) 04/08/2013  . S/P CABG x 3 - 2010 04/08/2013  . S/P AVR-tissue, 2010 04/08/2013  . DM2 (diabetes mellitus, type 2) ([place]) 04/08/2013  .  Dyslipidemia 04/08/2013  . SUBACROMIAL BURSITIS, LEFT 11/05/2009    Teena Irani, PTA/CLT [phone]  12/03/2015, 3:57 PM  [facility] [address] [place], [name], [address] Phone: [phone]   Fax:  [phone]  Name: NICHOLUS FERGUS MRN: [id number] Date of Birth: November 23, 1945

## 2015-12-03 NOTE — Therapy (Addendum)
[place] [address] [place], [name], [address] Phone: [phone]   Fax:  [phone]  Wound Care Therapy reassessment and gcode update  Patient Details  Name: [name] MRN: [id number] Date of Birth: 1945-12-20 No Data Recorded  Encounter Date: 12/03/2015      PT End of Session - 12/03/15 1556    Visit Number 61   Number of Visits 30   Date for PT Re-Evaluation 01/07/16   Authorization Type UHC Medicare: Gcode and reassessment complete visit 76   Authorization Time Period 04/22/15 to 06/21/05   Authorization - Visit Number 32   Authorization - Number of Visits 21   PT Start Time 0905   PT Stop Time 0940   PT Time Calculation (min) 35 min   Activity Tolerance Patient tolerated treatment well   Behavior During Therapy [facility] for tasks assessed/performed      Past Medical History  Diagnosis Date  . Hypertension   . Diabetes mellitus     type 2   . History of valve replacement 2010    [facility] Ease bioprosthetic 69mm  . Thyroid disease   . Stroke [facility]) 2003    R-sided weakness & upper extremity swelling   . Coronary artery disease   . Dyslipidemia   . At risk for sudden cardiac death 2013-06-22  . PAF (paroxysmal atrial fibrillation) ([place]) 05/2013    converted with amiodarone to SR  . Chronic anticoagulation 05/2013    started  . S/P cardiac catheterization, Rt & Lt heart cath 06/05/2013 2013-06-22  . S/P CABG x 3 2010  . GERD (gastroesophageal reflux disease)   . Cataract   . Chronic kidney disease     Patient reports he is seeing Kidney doctor in September    Past Surgical History  Procedure Laterality Date  . Coronary artery bypass graft  01/26/2009    LIMA to LAD, SVG to circumflex, SVG to PDA  . Aortic valve replacement  01/26/2009    [facility] Ease bioprosthetic 33mm valve  . Transthoracic echocardiogram  09/2011    grade 1 diastolic dysfunction; increasing valve gradient; calcified MV  annulus   . Cardiac catheterization  06/05/2013    native 3 vessel disease; patent LIMA to LAD, SVG to OM and SVG to PDA; Elevated right and left heart filling pressures, with predominantly pulmonary venous hypertension, normal PVR  . Transthoracic echocardiogram  06/03/2013    EF 25-30%, mod conc. hypertrophy, grade 3 diastolic dysfunction; LA mod dilated; calcified MV annulus; transaortic gradients are normal for the bioprosthetic valve; inf vena cava dilated (elevated CVP) - LifeVest  . Left and right heart catheterization with coronary angiogram N/A 06/05/2013    Procedure: LEFT AND RIGHT HEART CATHETERIZATION WITH CORONARY ANGIOGRAM;  Surgeon: Leonie Man, MD;  Location: [facility] CATH LAB;  Service: Cardiovascular;  Laterality: N/A;  . Graft(s) angiogram  06/05/2013    Procedure: GRAFT(S) Cyril Loosen;  Surgeon: Leonie Man, MD;  Location: [facility] CATH LAB;  Service: Cardiovascular;;  . Colonoscopy  2007    Dr. Aviva Signs: internal hemorrhoids    There were no vitals filed for this visit.                  Wound Therapy - 12/03/15 1555    Subjective Pt wtihout pain or complaints.  Dressing intact   Pain Assessment No/denies pain   Wound Properties Date First Assessed: 06/23/15 Time First Assessed: 0900 Wound Type: Other (Comment) Location: Toe (  Comment  which one) Location Orientation: Right Wound Description (Comments): medial  aspect of Rt great toe Present on Admission: No   Dressing Type Silver hydrofiber;Gauze (Comment);Tape dressing   Dressing Changed Changed   Dressing Status Old drainage   Dressing Change Frequency Every 3 days   Site / Wound Assessment Granulation tissue   % Wound base Red or Granulating 90%   % Wound base Yellow 10%   Peri-wound Assessment Pink;Intact   Wound Length (cm) 1.3 cm  was 1.4 cm   Wound Width (cm) 1.1 cm  was 1.1 cm   Undermining (cm) 80% of edges undermined slightly   Margins Unattached edges (unapproximated)   Drainage Amount  Moderate   Drainage Description Serosanguineous   Treatment Cleansed;Debridement (Selective)   Incision Properties Date First Assessed: 04/23/15 Time First Assessed: 1738 Location: Groin Location Orientation: Left Present on Admission: No   Selective Debridement - Location edges of wound   Selective Debridement - Tools Used Forceps;Scalpel;Scissors   Selective Debridement - Tissue Removed edges, maceration   Wound Therapy - Clinical Statement Reassessment completed this sesssion.  Remeasured with only slight reduction in size.  Much improved integrity of perimeter of wound today.  No maceration and reduced hypergranulation of wound since using tape to compress down.  Removed dead skin from perimeter of wound.  Continued use of silver hydrofiber with medipore placed tightly over one side to increase compression and decrease hypergranulation.    No reports of pain through session.  Covered with conform and secured with medipore tape.  Pt reported overall comfort.  If continued hypergranulation, may need to return to MD to get treated with silver nitrate stick.       Hydrotherapy Plan Debridement;Dressing change   Wound Therapy - Current Recommendations PT   Wound Plan Continue with debridement and dressing change to wound    Decrease Necrotic Tissue to 20%   Decrease Necrotic Tissue - Progress Progressing toward goal   Increase Granulation Tissue to 80%   Increase Granulation Tissue - Progress Progressing toward goal   Decrease Length/Width/Depth by (cm) 1.0   Decrease Length/Width/Depth - Progress Progressing toward goal   Improve Drainage Characteristics Min   Improve Drainage Characteristics - Progress Progressing toward goal   Patient/Family will be able to  independent in wound dressing   Patient/Family Instruction Goal - Progress Progressing toward goal   Additional Wound Therapy Goal full healing   Additional Wound Therapy Goal - Progress Progressing toward goal          G 8990  CK G 8991 CI                Patient will benefit from skilled therapeutic intervention in order to improve the following deficits and impairments:     Visit Diagnosis: Difficulty in walking, not elsewhere classified  Unsteadiness on feet  Open wound of right lower leg with complication, subsequent encounter     Problem List Patient Active Problem List   Diagnosis Date Noted  . Rectal bleeding 07/28/2015  . Chronic anticoagulation 07/28/2015  . PVD (peripheral vascular disease) ([place])   . S/P angioplasty 04/23/2015  . Peripheral arterial occlusive disease ([place]) 04/23/2015  . PAD (peripheral artery disease) ([place])   . Critical lower limb ischemia   . Toe ulcer, right ([place])   . Type II or unspecified type diabetes mellitus without mention of complication, uncontrolled 11/27/2013  . Acute respiratory failure with hypoxia ([place]) 11/26/2013  . CAP (community acquired pneumonia) 11/26/2013  . CHF  exacerbation ([place]) 11/25/2013  . Cardiomyopathy, ischemic 06/13/2013  . At risk for sudden cardiac death 2013/06/13  . S/P cardiac catheterization, Rt & Lt heart cath 06/05/2013 2013-06-13  . Acute on chronic combined systolic and diastolic HF (heart failure), NYHA class 3 ([place]) 06/04/2013  . Atrial flutter with rapid ventricular response ([place]) 06/02/2013  . Acute renal failure: Cr up to 1.8 06/02/2013  . Hyperkalemia 06/02/2013    Class: Acute  . LBBB (left bundle branch block) 06/01/2013  . Hypotension- secondary to AF and Diltiazem Rx 06/01/2013  . Obesity (BMI 30-39.9) 06/01/2013  . Acute on chronic diastolic heart failure - due to Afib AVR 06/01/2013  . Atrial fibrillation with RVR- converted with Amiodarone 05/31/2013  . Stroke- Lt brain 2003 04/08/2013  . Hemiplegia affecting right dominant side ([place]) 04/08/2013  . Cataract 04/08/2013  . GERD (gastroesophageal reflux disease) 04/08/2013  . S/P CABG x 3 - 2010 04/08/2013  . S/P AVR-tissue, 2010 04/08/2013  . DM2  (diabetes mellitus, type 2) ([place]) 04/08/2013  . Dyslipidemia 04/08/2013  . SUBACROMIAL BURSITIS, LEFT 11/05/2009    Teena Irani, PTA/CLT [phone]  12/03/2015, 4:14 PM  [place] [address] [place], [name], [address] Phone: [phone]   Fax:  [phone]  Name: [name] MRN: [id number] Date of Birth: 06-11-1946    Rayetta Humphrey, [place] CLT [phone]

## 2015-12-08 ENCOUNTER — Ambulatory Visit (HOSPITAL_COMMUNITY): Payer: Medicare Other | Admitting: Physical Therapy

## 2015-12-08 DIAGNOSIS — S81801D Unspecified open wound, right lower leg, subsequent encounter: Secondary | ICD-10-CM | POA: Diagnosis not present

## 2015-12-08 DIAGNOSIS — R2681 Unsteadiness on feet: Secondary | ICD-10-CM | POA: Diagnosis not present

## 2015-12-08 DIAGNOSIS — R262 Difficulty in walking, not elsewhere classified: Secondary | ICD-10-CM

## 2015-12-08 DIAGNOSIS — L97513 Non-pressure chronic ulcer of other part of right foot with necrosis of muscle: Secondary | ICD-10-CM

## 2015-12-08 DIAGNOSIS — S91109D Unspecified open wound of unspecified toe(s) without damage to nail, subsequent encounter: Secondary | ICD-10-CM | POA: Diagnosis not present

## 2015-12-08 NOTE — Therapy (Signed)
[facility] [place], [name], [address] Phone: [phone]   Fax:  [phone]  Wound Care Therapy  Patient Details  Name: [name] MRN: [id number] Date of Birth: [birth year] No Data Recorded  Encounter Date: 12/08/2015      PT End of Session - 12/08/15 0901    Visit Number 42   Number of Visits 50   Date for PT Re-Evaluation 01/07/16   Authorization Type UHC Medicare: Gcode and reassessment complete visit 61   Authorization - Visit Number 30   Authorization - Number of Visits 76   PT Start Time 0820   PT Stop Time 0850   PT Time Calculation (min) 30 min   Activity Tolerance Patient tolerated treatment well      Past Medical History  Diagnosis Date  . Hypertension   . Diabetes mellitus     type 2   . History of valve replacement 2010    [facility] Ease bioprosthetic 38mm  . Thyroid disease   . Stroke [facility]) 2003    R-sided weakness & upper extremity swelling   . Coronary artery disease   . Dyslipidemia   . At risk for sudden cardiac death 06/16/2013  . PAF (paroxysmal atrial fibrillation) ([place]) 05/2013    converted with amiodarone to SR  . Chronic anticoagulation 05/2013    started  . S/P cardiac catheterization, Rt & Lt heart cath 06/05/2013 Jun 16, 2013  . S/P CABG x 3 2010  . GERD (gastroesophageal reflux disease)   . Cataract   . Chronic kidney disease     Patient reports he is seeing Kidney doctor in September    Past Surgical History  Procedure Laterality Date  . Coronary artery bypass graft  01/26/2009    LIMA to LAD, SVG to circumflex, SVG to PDA  . Aortic valve replacement  01/26/2009    [facility] Ease bioprosthetic 22mm valve  . Transthoracic echocardiogram  09/2011    grade 1 diastolic dysfunction; increasing valve gradient; calcified MV annulus   . Cardiac catheterization  06/05/2013    native 3 vessel disease; patent LIMA to LAD, SVG to OM and SVG to PDA; Elevated right and  left heart filling pressures, with predominantly pulmonary venous hypertension, normal PVR  . Transthoracic echocardiogram  06/03/2013    EF 25-30%, mod conc. hypertrophy, grade 3 diastolic dysfunction; LA mod dilated; calcified MV annulus; transaortic gradients are normal for the bioprosthetic valve; inf vena cava dilated (elevated CVP) - LifeVest  . Left and right heart catheterization with coronary angiogram N/A 06/05/2013    Procedure: LEFT AND RIGHT HEART CATHETERIZATION WITH CORONARY ANGIOGRAM;  Surgeon: Leonie Man, MD;  Location: [facility] CATH LAB;  Service: Cardiovascular;  Laterality: N/A;  . Graft(s) angiogram  06/05/2013    Procedure: GRAFT(S) Cyril Loosen;  Surgeon: Leonie Man, MD;  Location: [facility] CATH LAB;  Service: Cardiovascular;;  . Colonoscopy  2007    Dr. Aviva Signs: internal hemorrhoids    There were no vitals filed for this visit.                  Wound Therapy - 12/08/15 0852    Subjective Pt states that he fell a week ago Friday and his Rt knee is still sore from the fall    Patient and Family Stated Goals wound to heal    Pain Assessment No/denies pain   Wound Properties Date First Assessed: 06/23/15 Time First Assessed: 0900 Wound Type: Other (Comment)  Location: Toe (Comment  which one) Location Orientation: Right Wound Description (Comments): medial  aspect of Rt great toe Present on Admission: No   Dressing Type Silver hydrofiber;Gauze (Comment);Tape dressing   Dressing Changed Changed   Dressing Status Old drainage   Dressing Change Frequency Every 3 days   Site / Wound Assessment Bleeding;Granulation tissue   % Wound base Red or Granulating 90%   % Wound base Yellow 10%   Peri-wound Assessment Intact;Pink   Margins Epibole (rolled edges)   Drainage Amount Minimal   Drainage Description Serosanguineous   Treatment Cleansed;Debridement (Selective)   Incision Properties Date First Assessed: 04/23/15 Time First Assessed: 1738 Location: Groin  Location Orientation: Left Present on Admission: No   Selective Debridement - Location edges of wound and wound bed proper    Selective Debridement - Tools Used Forceps;Scissors   Selective Debridement - Tissue Removed epiboled skin, slough off wound bed    Wound Therapy - Clinical Statement Pt wound is improving with decrease hypertrophy of tissue.  Pt unable to care for wound himself due to previous CVA and living alone.  Pt to continue skilled PT until wound is healed.     Wound Therapy - Functional Problem List difficulty wearing shoes    Factors Delaying/Impairing Wound Healing Altered sensation;Diabetes Mellitus;Infection - systemic/local;Immobility;Multiple medical problems;Polypharmacy   Hydrotherapy Plan Debridement;Dressing change;Patient/family education   Wound Therapy - Current Recommendations PT   Wound Plan Continue with cleansing , debridement and dressing change using medipore tape to keep wound from becoming hypertrophic.    Decrease Necrotic Tissue to 20%   Decrease Necrotic Tissue - Progress Progressing toward goal   Increase Granulation Tissue to 80%   Increase Granulation Tissue - Progress Progressing toward goal   Decrease Length/Width/Depth by (cm) 1.0   Decrease Length/Width/Depth - Progress Progressing toward goal   Improve Drainage Characteristics Min   Improve Drainage Characteristics - Progress Progressing toward goal   Patient/Family will be able to  independent in wound dressing   Patient/Family Instruction Goal - Progress Progressing toward goal   Additional Wound Therapy Goal full healing   Additional Wound Therapy Goal - Progress Progressing toward goal                           Patient will benefit from skilled therapeutic intervention in order to improve the following deficits and impairments:     Visit Diagnosis: Difficulty in walking, not elsewhere classified  Unsteadiness on feet  Non-pressure chronic ulcer of other part of  right foot with necrosis of muscle ([place])     Problem List Patient Active Problem List   Diagnosis Date Noted  . Rectal bleeding 07/28/2015  . Chronic anticoagulation 07/28/2015  . PVD (peripheral vascular disease) ([place])   . S/P angioplasty 04/23/2015  . Peripheral arterial occlusive disease ([place]) 04/23/2015  . PAD (peripheral artery disease) ([place])   . Critical lower limb ischemia   . Toe ulcer, right ([place])   . Type II or unspecified type diabetes mellitus without mention of complication, uncontrolled 11/27/2013  . Acute respiratory failure with hypoxia ([place]) 11/26/2013  . CAP (community acquired pneumonia) 11/26/2013  . CHF exacerbation ([place]) 11/25/2013  . Cardiomyopathy, ischemic 06/13/2013  . At risk for sudden cardiac death 2013-06-24  . S/P cardiac catheterization, Rt & Lt heart cath 06/05/2013 June 24, 2013  . Acute on chronic combined systolic and diastolic HF (heart failure), NYHA class 3 ([place]) 06/04/2013  . Atrial flutter with rapid ventricular response (  [place]) 06/02/2013  . Acute renal failure: Cr up to 1.8 06/02/2013  . Hyperkalemia 06/02/2013    Class: Acute  . LBBB (left bundle branch block) 06/01/2013  . Hypotension- secondary to AF and Diltiazem Rx 06/01/2013  . Obesity (BMI 30-39.9) 06/01/2013  . Acute on chronic diastolic heart failure - due to Afib AVR 06/01/2013  . Atrial fibrillation with RVR- converted with Amiodarone 05/31/2013  . Stroke- Lt brain 2003 04/08/2013  . Hemiplegia affecting right dominant side ([place]) 04/08/2013  . Cataract 04/08/2013  . GERD (gastroesophageal reflux disease) 04/08/2013  . S/P CABG x 3 - 2010 04/08/2013  . S/P AVR-tissue, 2010 04/08/2013  . DM2 (diabetes mellitus, type 2) ([place]) 04/08/2013  . Dyslipidemia 04/08/2013  . SUBACROMIAL BURSITIS, LEFT 11/05/2009   Rayetta Humphrey, PT CLT [phone] 12/08/2015, 9:13 AM  [facility] [address] [place], [name], [address] Phone:  [phone]   Fax:  [phone]  Name: [name] MRN: [id number] Date of Birth: [birth year]

## 2015-12-10 ENCOUNTER — Ambulatory Visit (HOSPITAL_COMMUNITY): Payer: Medicare Other | Admitting: Physical Therapy

## 2015-12-10 DIAGNOSIS — L97513 Non-pressure chronic ulcer of other part of right foot with necrosis of muscle: Secondary | ICD-10-CM

## 2015-12-10 DIAGNOSIS — S81801D Unspecified open wound, right lower leg, subsequent encounter: Secondary | ICD-10-CM

## 2015-12-10 DIAGNOSIS — R262 Difficulty in walking, not elsewhere classified: Secondary | ICD-10-CM

## 2015-12-10 DIAGNOSIS — R2681 Unsteadiness on feet: Secondary | ICD-10-CM

## 2015-12-10 DIAGNOSIS — S91109D Unspecified open wound of unspecified toe(s) without damage to nail, subsequent encounter: Secondary | ICD-10-CM | POA: Diagnosis not present

## 2015-12-10 NOTE — Therapy (Signed)
[facility] [place], [name], [address] Phone: [phone]   Fax:  [phone]  Wound Care Therapy  Patient Details  Name: [name] MRN: [id number] Date of Birth: 1946/04/02 No Data Recorded  Encounter Date: 12/10/2015      PT End of Session - 12/10/15 0946    Visit Number 62   Number of Visits 70   Date for PT Re-Evaluation 01/07/16   Authorization Type UHC Medicare: Gcode and reassessment complete visit 61   Authorization - Visit Number 63   Authorization - Number of Visits 79   PT Start Time 0905   PT Stop Time 0935   PT Time Calculation (min) 30 min   Activity Tolerance Patient tolerated treatment well   Behavior During Therapy [facility] for tasks assessed/performed      Past Medical History  Diagnosis Date  . Hypertension   . Diabetes mellitus     type 2   . History of valve replacement 2010    [facility] Ease bioprosthetic 22mm  . Thyroid disease   . Stroke [facility]) 2003    R-sided weakness & upper extremity swelling   . Coronary artery disease   . Dyslipidemia   . At risk for sudden cardiac death 07-03-13  . PAF (paroxysmal atrial fibrillation) ([place]) 05/2013    converted with amiodarone to SR  . Chronic anticoagulation 05/2013    started  . S/P cardiac catheterization, Rt & Lt heart cath 06/05/2013 03-Jul-2013  . S/P CABG x 3 2010  . GERD (gastroesophageal reflux disease)   . Cataract   . Chronic kidney disease     Patient reports he is seeing Kidney doctor in September    Past Surgical History  Procedure Laterality Date  . Coronary artery bypass graft  01/26/2009    LIMA to LAD, SVG to circumflex, SVG to PDA  . Aortic valve replacement  01/26/2009    [facility] Ease bioprosthetic 53mm valve  . Transthoracic echocardiogram  09/2011    grade 1 diastolic dysfunction; increasing valve gradient; calcified MV annulus   . Cardiac catheterization  06/05/2013    native 3 vessel disease; patent  LIMA to LAD, SVG to OM and SVG to PDA; Elevated right and left heart filling pressures, with predominantly pulmonary venous hypertension, normal PVR  . Transthoracic echocardiogram  06/03/2013    EF 25-30%, mod conc. hypertrophy, grade 3 diastolic dysfunction; LA mod dilated; calcified MV annulus; transaortic gradients are normal for the bioprosthetic valve; inf vena cava dilated (elevated CVP) - LifeVest  . Left and right heart catheterization with coronary angiogram N/A 06/05/2013    Procedure: LEFT AND RIGHT HEART CATHETERIZATION WITH CORONARY ANGIOGRAM;  Surgeon: Leonie Man, MD;  Location: [facility] C[facility] LAB;  Service: Cardiovascular;  Laterality: N/A;  . Graft(s) angiogram  06/05/2013    Procedure: GRAFT(S) Cyril Loosen;  Surgeon: Leonie Man, MD;  Location: [facility] CATH LAB;  Service: Cardiovascular;;  . Colonoscopy  2007    Dr. Aviva Signs: internal hemorrhoids    There were no vitals filed for this visit.                  Wound Therapy - 12/10/15 0940    Subjective Pt states his back is hurting today.  Plans on going to the MD about this.  No problems with his wound.    Patient and Family Stated Goals wound to heal    Pain Assessment No/denies pain   Wound Properties  Date First Assessed: 06/23/15 Time First Assessed: 0900 Wound Type: Other (Comment) Location: Toe (Comment  which one) Location Orientation: Right Wound Description (Comments): medial  aspect of Rt great toe Present on Admission: No   Dressing Type Silver hydrofiber;Gauze (Comment);Tape dressing   Dressing Changed Changed   Dressing Status Old drainage   Dressing Change Frequency Every 3 days   Site / Wound Assessment Bleeding;Granulation tissue   % Wound base Red or Granulating 95%   % Wound base Yellow 5%   Peri-wound Assessment Intact;Pink   Margins Unattached edges (unapproximated)   Drainage Amount Minimal   Drainage Description Serosanguineous   Treatment Cleansed;Debridement (Selective)   Incision  Properties Date First Assessed: 04/23/15 Time First Assessed: 1738 Location: Groin Location Orientation: Left Present on Admission: No   Selective Debridement - Location edges of wound and wound bed   Selective Debridement - Tools Used Forceps;Scissors   Selective Debridement - Tissue Removed epiboled skin, slough off wound bed    Wound Therapy - Clinical Statement Continued with currentl wound care.  Some maceration noted perimeter of wound this session.  Debridment completed, mainly to edges to remove undermining.  None present on most proximal edge closer to doral surface but entire of plantar border with undermining.  Medipore straped tightly to outer side is helping to increase compression and decrease hypertrophy.  continued with silver hydrofiber to decrease maceration and promote approximation.  Pt tolerated well.     Wound Therapy - Functional Problem List difficulty wearing shoes    Factors Delaying/Impairing Wound Healing Altered sensation;Diabetes Mellitus;Infection - systemic/local;Immobility;Multiple medical problems;Polypharmacy   Hydrotherapy Plan Debridement;Dressing change;Patient/family education   Wound Therapy - Current Recommendations PT   Wound Plan Continue with cleansing , debridement and dressing change using medipore tape to keep wound from becoming hypertrophic.    Decrease Necrotic Tissue to 20%   Increase Granulation Tissue to 80%   Decrease Length/Width/Depth by (cm) 1.0   Improve Drainage Characteristics Min   Patient/Family will be able to  independent in wound dressing   Additional Wound Therapy Goal full healing                           Patient will benefit from skilled therapeutic intervention in order to improve the following deficits and impairments:     Visit Diagnosis: Difficulty in walking, not elsewhere classified  Unsteadiness on feet  Non-pressure chronic ulcer of other part of right foot with necrosis of muscle (H[facility])  Open  wound of right lower leg with complication, subsequent encounter     Problem List Patient Active Problem List   Diagnosis Date Noted  . Rectal bleeding 07/28/2015  . Chronic anticoagulation 07/28/2015  . PVD (peripheral vascular disease) ([place])   . S/P angioplasty 04/23/2015  . Peripheral arterial occlusive disease ([place]) 04/23/2015  . PAD (peripheral artery disease) ([place])   . Critical lower limb ischemia   . Toe ulcer, right ([place])   . Type II or unspecified type diabetes mellitus without mention of complication, uncontrolled 11/27/2013  . Acute respiratory failure with hypoxia ([place]) 11/26/2013  . CAP (community acquired pneumonia) 11/26/2013  . CHF exacerbation ([place]) 11/25/2013  . Cardiomyopathy, ischemic 06/13/2013  . At risk for sudden cardiac death Jun 22, 2013  . S/P cardiac catheterization, Rt & Lt heart cath 06/05/2013 06-22-2013  . Acute on chronic combined systolic and diastolic HF (heart failure), NYHA class 3 ([place]) 06/04/2013  . Atrial flutter with rapid ventricular response ([place]) 06/02/2013  .  Acute renal failure: Cr up to 1.8 06/02/2013  . Hyperkalemia 06/02/2013    Class: Acute  . LBBB (left bundle branch block) 06/01/2013  . Hypotension- secondary to AF and Diltiazem Rx 06/01/2013  . Obesity (BMI 30-39.9) 06/01/2013  . Acute on chronic diastolic heart failure - due to Afib AVR 06/01/2013  . Atrial fibrillation with RVR- converted with Amiodarone 05/31/2013  . Stroke- Lt brain 2003 04/08/2013  . Hemiplegia affecting right dominant side ([place]) 04/08/2013  . Cataract 04/08/2013  . GERD (gastroesophageal reflux disease) 04/08/2013  . S/P CABG x 3 - 2010 04/08/2013  . S/P AVR-tissue, 2010 04/08/2013  . DM2 (diabetes mellitus, type 2) ([place]) 04/08/2013  . Dyslipidemia 04/08/2013  . SUBACROMIAL BURSITIS, LEFT 11/05/2009    Teena Irani, PTA/CLT [phone]  12/10/2015, 9:48 AM  [facility] [address] [place],  [name], [address] Phone: [phone]   Fax:  [phone]  Name: [name] MRN: [id number] Date of Birth: 1946-01-20

## 2015-12-15 ENCOUNTER — Ambulatory Visit (HOSPITAL_COMMUNITY): Payer: Medicare Other | Attending: Orthopaedic Surgery | Admitting: Physical Therapy

## 2015-12-15 DIAGNOSIS — L97111 Non-pressure chronic ulcer of right thigh limited to breakdown of skin: Secondary | ICD-10-CM | POA: Diagnosis not present

## 2015-12-15 DIAGNOSIS — R262 Difficulty in walking, not elsewhere classified: Secondary | ICD-10-CM | POA: Diagnosis not present

## 2015-12-15 DIAGNOSIS — S81801D Unspecified open wound, right lower leg, subsequent encounter: Secondary | ICD-10-CM | POA: Insufficient documentation

## 2015-12-15 DIAGNOSIS — R2681 Unsteadiness on feet: Secondary | ICD-10-CM | POA: Diagnosis not present

## 2015-12-15 DIAGNOSIS — L97513 Non-pressure chronic ulcer of other part of right foot with necrosis of muscle: Secondary | ICD-10-CM | POA: Insufficient documentation

## 2015-12-15 NOTE — Therapy (Signed)
[facility] [place], [name], [address] Phone: [phone]   Fax:  [phone]  Wound Care Therapy  Patient Details  Name: [name] MRN: [id number] Date of Birth: 1945-09-03 No Data Recorded  Encounter Date: 12/15/2015      PT End of Session - 12/15/15 1242    Visit Number 70   Number of Visits 68   Date for PT Re-Evaluation 01/07/16   Authorization Type UHC Medicare: Gcode and reassessment complete visit 77   Authorization - Visit Number 71   Authorization - Number of Visits 45   PT Start Time 0840   PT Stop Time 0900   PT Time Calculation (min) 20 min   Activity Tolerance Patient tolerated treatment well   Behavior During Therapy [facility] for tasks assessed/performed      Past Medical History  Diagnosis Date  . Hypertension   . Diabetes mellitus     type 2   . History of valve replacement 2010    [facility] Ease bioprosthetic 68mm  . Thyroid disease   . Stroke [facility]) 2003    R-sided weakness & upper extremity swelling   . Coronary artery disease   . Dyslipidemia   . At risk for sudden cardiac death 06-30-13  . PAF (paroxysmal atrial fibrillation) ([place]) 05/2013    converted with amiodarone to SR  . Chronic anticoagulation 05/2013    started  . S/P cardiac catheterization, Rt & Lt heart cath 06/05/2013 2013/06/30  . S/P CABG x 3 2010  . GERD (gastroesophageal reflux disease)   . Cataract   . Chronic kidney disease     Patient reports he is seeing Kidney doctor in September    Past Surgical History  Procedure Laterality Date  . Coronary artery bypass graft  01/26/2009    LIMA to LAD, SVG to circumflex, SVG to PDA  . Aortic valve replacement  01/26/2009    [facility] Ease bioprosthetic 6mm valve  . Transthoracic echocardiogram  09/2011    grade 1 diastolic dysfunction; increasing valve gradient; calcified MV annulus   . Cardiac catheterization  06/05/2013    native 3 vessel disease; patent  LIMA to LAD, SVG to OM and SVG to PDA; Elevated right and left heart filling pressures, with predominantly pulmonary venous hypertension, normal PVR  . Transthoracic echocardiogram  06/03/2013    EF 25-30%, mod conc. hypertrophy, grade 3 diastolic dysfunction; LA mod dilated; calcified MV annulus; transaortic gradients are normal for the bioprosthetic valve; inf vena cava dilated (elevated CVP) - LifeVest  . Left and right heart catheterization with coronary angiogram N/A 06/05/2013    Procedure: LEFT AND RIGHT HEART CATHETERIZATION WITH CORONARY ANGIOGRAM;  Surgeon: Leonie Man, MD;  Location: [facility] CATH LAB;  Service: Cardiovascular;  Laterality: N/A;  . Graft(s) angiogram  06/05/2013    Procedure: GRAFT(S) Cyril Loosen;  Surgeon: Leonie Man, MD;  Location: [facility] CATH LAB;  Service: Cardiovascular;;  . Colonoscopy  2007    Dr. Aviva Signs: internal hemorrhoids    There were no vitals filed for this visit.                  Wound Therapy - 12/15/15 1237    Subjective Pt was late for appt due to falling.  STates his rt knee keeps giving out on him.  NO pain in Rt great toe today.   Patient and Family Stated Goals wound to heal    Pain Assessment No/denies pain  Wound Properties Date First Assessed: 06/23/15 Time First Assessed: 0900 Wound Type: Other (Comment) Location: Toe (Comment  which one) Location Orientation: Right Wound Description (Comments): medial  aspect of Rt great toe Present on Admission: No   Dressing Type Silver hydrofiber;Gauze (Comment);Tape dressing   Dressing Changed Changed   Dressing Status Old drainage   Dressing Change Frequency Every 3 days   Site / Wound Assessment Bleeding;Granulation tissue   % Wound base Red or Granulating 95%   % Wound base Yellow 5%   Peri-wound Assessment Intact;Pink   Margins Unattached edges (unapproximated)   Drainage Amount Moderate   Drainage Description Serosanguineous   Treatment Cleansed;Debridement (Selective)    Incision Properties Date First Assessed: 04/23/15 Time First Assessed: 1738 Location: Groin Location Orientation: Left Present on Admission: No   Selective Debridement - Location edges of wound and wound bed   Selective Debridement - Tools Used Forceps;Scissors   Selective Debridement - Tissue Removed epiboled skin, slough off wound bed    Wound Therapy - Clinical Statement Continued with current wound care.  Wound conintues to show hypergranulation with difficulty approximating.  PT may benefit from trial of laser to help with healing and/or return to MD for silver nitrate treatment to decrease hypergranulation.  To discuss with evaluating therapist.  Suggested patient go to MD about his knee as he is having pain and "giving way" causing falls.     Wound Therapy - Functional Problem List difficulty wearing shoes    Factors Delaying/Impairing Wound Healing Altered sensation;Diabetes Mellitus;Infection - systemic/local;Immobility;Multiple medical problems;Polypharmacy   Hydrotherapy Plan Debridement;Dressing change;Patient/family education   Wound Therapy - Current Recommendations PT   Wound Plan Continue with cleansing , debridement and dressing change using medipore tape to keep wound from becoming hypertrophic.    Decrease Necrotic Tissue to 20%   Increase Granulation Tissue to 80%   Decrease Length/Width/Depth by (cm) 1.0   Improve Drainage Characteristics Min   Patient/Family will be able to  independent in wound dressing   Additional Wound Therapy Goal full healing                           Patient will benefit from skilled therapeutic intervention in order to improve the following deficits and impairments:     Visit Diagnosis: Difficulty in walking, not elsewhere classified  Unsteadiness on feet  Non-pressure chronic ulcer of other part of right foot with necrosis of muscle ([place])     Problem List Patient Active Problem List   Diagnosis Date Noted  . Rectal  bleeding 07/28/2015  . Chronic anticoagulation 07/28/2015  . PVD (peripheral vascular disease) ([place])   . S/P angioplasty 04/23/2015  . Peripheral arterial occlusive disease ([place]) 04/23/2015  . PAD (peripheral artery disease) ([place])   . Critical lower limb ischemia   . Toe ulcer, right ([place])   . Type II or unspecified type diabetes mellitus without mention of complication, uncontrolled 11/27/2013  . Acute respiratory failure with hypoxia ([place]) 11/26/2013  . CAP (community acquired pneumonia) 11/26/2013  . CHF exacerbation ([place]) 11/25/2013  . Cardiomyopathy, ischemic 06/13/2013  . At risk for sudden cardiac death 07-02-2013  . S/P cardiac catheterization, Rt & Lt heart cath 06/05/2013 July 02, 2013  . Acute on chronic combined systolic and diastolic HF (heart failure), NYHA class 3 ([place]) 06/04/2013  . Atrial flutter with rapid ventricular response ([place]) 06/02/2013  . Acute renal failure: Cr up to 1.8 06/02/2013  . Hyperkalemia 06/02/2013    Class:  Acute  . LBBB (left bundle branch block) 06/01/2013  . Hypotension- secondary to AF and Diltiazem Rx 06/01/2013  . Obesity (BMI 30-39.9) 06/01/2013  . Acute on chronic diastolic heart failure - due to Afib AVR 06/01/2013  . Atrial fibrillation with RVR- converted with Amiodarone 05/31/2013  . Stroke- Lt brain 2003 04/08/2013  . Hemiplegia affecting right dominant side ([place]) 04/08/2013  . Cataract 04/08/2013  . GERD (gastroesophageal reflux disease) 04/08/2013  . S/P CABG x 3 - 2010 04/08/2013  . S/P AVR-tissue, 2010 04/08/2013  . DM2 (diabetes mellitus, type 2) ([place]) 04/08/2013  . Dyslipidemia 04/08/2013  . SUBACROMIAL BURSITIS, LEFT 11/05/2009    Teena Irani, PTA/CLT [phone]  12/15/2015, 12:43 PM  [facility] [address] [place], [name], [address] Phone: [phone]   Fax:  [phone]  Name: [name] MRN: [id number] Date of Birth: [birth year]

## 2015-12-16 DIAGNOSIS — E1165 Type 2 diabetes mellitus with hyperglycemia: Secondary | ICD-10-CM | POA: Diagnosis not present

## 2015-12-16 DIAGNOSIS — S80219A Abrasion, unspecified knee, initial encounter: Secondary | ICD-10-CM | POA: Diagnosis not present

## 2015-12-17 ENCOUNTER — Telehealth (HOSPITAL_COMMUNITY): Payer: Self-pay | Admitting: Physical Therapy

## 2015-12-17 ENCOUNTER — Encounter (HOSPITAL_COMMUNITY): Payer: Self-pay

## 2015-12-17 ENCOUNTER — Ambulatory Visit (HOSPITAL_COMMUNITY): Payer: Medicare Other

## 2015-12-17 DIAGNOSIS — R2681 Unsteadiness on feet: Secondary | ICD-10-CM

## 2015-12-17 DIAGNOSIS — R262 Difficulty in walking, not elsewhere classified: Secondary | ICD-10-CM

## 2015-12-17 DIAGNOSIS — S81801D Unspecified open wound, right lower leg, subsequent encounter: Secondary | ICD-10-CM | POA: Diagnosis not present

## 2015-12-17 DIAGNOSIS — L97513 Non-pressure chronic ulcer of other part of right foot with necrosis of muscle: Secondary | ICD-10-CM | POA: Diagnosis not present

## 2015-12-17 DIAGNOSIS — L97111 Non-pressure chronic ulcer of right thigh limited to breakdown of skin: Secondary | ICD-10-CM | POA: Diagnosis not present

## 2015-12-17 NOTE — Telephone Encounter (Signed)
Nurse returned call from [facility], [place].  Nurse reported they would schedule appointment with Mr. Wedell to silver nitrate his toe due to hypergranulation.   Teena Irani, PTA/CLT [phone]

## 2015-12-17 NOTE — Therapy (Signed)
[place], [name], [address] Phone: [phone]   Fax:  [phone]  Wound Care Therapy  Patient Details  Name: [name] MRN: [id number] Date of Birth: April 03, 1946 No Data Recorded  Encounter Date: 12/17/2015      PT End of Session - 12/17/15 1026    Visit Number 65   Number of Visits 70   Date for PT Re-Evaluation 01/07/16   Authorization Type UHC Medicare: Gcode and reassessment complete visit 61   Authorization Time Period 04/22/15 to 06/21/05   Authorization - Visit Number 95   Authorization - Number of Visits 9   PT Start Time 0940   PT Stop Time 1008   PT Time Calculation (min) 28 min   Activity Tolerance Patient tolerated treatment well   Behavior During Therapy [facility] for tasks assessed/performed      Past Medical History  Diagnosis Date  . Hypertension   . Diabetes mellitus     type 2   . History of valve replacement 2010    [facility] Ease bioprosthetic 59mm  . Thyroid disease   . Stroke [facility]) 2003    R-sided weakness & upper extremity swelling   . Coronary artery disease   . Dyslipidemia   . At risk for sudden cardiac death 06/27/2013  . PAF (paroxysmal atrial fibrillation) ([place]) 05/2013    converted with amiodarone to SR  . Chronic anticoagulation 05/2013    started  . S/P cardiac catheterization, Rt & Lt heart cath 06/05/2013 2013/06/27  . S/P CABG x 3 2010  . GERD (gastroesophageal reflux disease)   . Cataract   . Chronic kidney disease     Patient reports he is seeing Kidney doctor in September    Past Surgical History  Procedure Laterality Date  . Coronary artery bypass graft  01/26/2009    LIMA to LAD, SVG to circumflex, SVG to PDA  . Aortic valve replacement  01/26/2009    [facility] Ease bioprosthetic 83mm valve  . Transthoracic echocardiogram  09/2011    grade 1 diastolic dysfunction; increasing valve gradient; calcified MV annulus   . Cardiac catheterization   06/05/2013    native 3 vessel disease; patent LIMA to LAD, SVG to OM and SVG to PDA; Elevated right and left heart filling pressures, with predominantly pulmonary venous hypertension, normal PVR  . Transthoracic echocardiogram  06/03/2013    EF 25-30%, mod conc. hypertrophy, grade 3 diastolic dysfunction; LA mod dilated; calcified MV annulus; transaortic gradients are normal for the bioprosthetic valve; inf vena cava dilated (elevated CVP) - LifeVest  . Left and right heart catheterization with coronary angiogram N/A 06/05/2013    Procedure: LEFT AND RIGHT HEART CATHETERIZATION WITH CORONARY ANGIOGRAM;  Surgeon: Leonie Man, MD;  Location: [facility] C[facility] LAB;  Service: Cardiovascular;  Laterality: N/A;  . Graft(s) angiogram  06/05/2013    Procedure: GRAFT(S) Cyril Loosen;  Surgeon: Leonie Man, MD;  Location: [facility] CATH LAB;  Service: Cardiovascular;;  . Colonoscopy  2007    Dr. Aviva Signs: internal hemorrhoids    There were no vitals filed for this visit.       Subjective Assessment - 12/17/15 1011    Subjective Foot is feeling good today, no reports of pain today, took pain medication earlier for knee pain.  Have new order for Rt knee wound.   Pertinent History Patient reports that this wound started as a small little brown spot on the end of his toe  that just kept getting bigger; he thinks it started about 8 months ago. He reports that it is now slowly getting better but has been healing slow. He states  that it has been getting better.    Currently in Pain? No/denies                   Wound Therapy - 12/17/15 1012    Subjective Foot is feeling good today, no reports of pain today, took pain medication earlier for knee pain.  Have new order for Rt knee pain.   Patient and Family Stated Goals wound to heal    Pain Assessment No/denies pain   Wound Properties Date First Assessed: 06/23/15 Time First Assessed: 0900 Wound Type: Other (Comment) Location: Toe (Comment  which one)  Location Orientation: Right Wound Description (Comments): medial  aspect of Rt great toe Present on Admission: No   Dressing Type Silver hydrofiber;Gauze (Comment);Tape dressing   Dressing Changed Changed   Dressing Status Old drainage   Dressing Change Frequency Every 3 days   Site / Wound Assessment Bleeding;Granulation tissue   % Wound base Red or Granulating 95%   % Wound base Yellow 5%   Peri-wound Assessment Intact;Pink   Margins Unattached edges (unapproximated)   Drainage Amount Minimal   Drainage Description Serosanguineous   Treatment Cleansed;Debridement (Selective)   Incision Properties Date First Assessed: 04/23/15 Time First Assessed: 1738 Location: Groin Location Orientation: Left Present on Admission: No   Selective Debridement - Location edges of wound and wound bed   Selective Debridement - Tools Used Forceps;Scissors   Selective Debridement - Tissue Removed epiboled skin, slough off wound bed    Wound Therapy - Clinical Statement Minimal debridement necessary this session, removal of slough and dead skin perimeter of wound.  Wound continues to show hypergranulation with difficulty approximating.   Pt would benefit from laser or silver nitrate to promote healing of toe wound, called MD office and left message concerning silver nitrate tx, awaiting call back.   Wound Therapy - Functional Problem List difficulty wearing shoes    Factors Delaying/Impairing Wound Healing Altered sensation;Diabetes Mellitus;Infection - systemic/local;Immobility;Multiple medical problems;Polypharmacy   Hydrotherapy Plan Debridement;Dressing change;Patient/family education   Wound Therapy - Current Recommendations PT   Wound Plan Evaluate Rt knee next session.  Continue with cleansing , debridement and dressing change using medipore tape to keep wound from becoming hypertrophic.            Patient will benefit from skilled therapeutic intervention in order to improve the following deficits  and impairments:     Visit Diagnosis: Difficulty in walking, not elsewhere classified  Unsteadiness on feet  Non-pressure chronic ulcer of other part of right foot with necrosis of muscle (HCC)  Open wound of right lower leg with complication, subsequent encounter     Problem List Patient Active Problem List   Diagnosis Date Noted  . Rectal bleeding 07/28/2015  . Chronic anticoagulation 07/28/2015  . PVD (peripheral vascular disease) ([place])   . S/P angioplasty 04/23/2015  . Peripheral arterial occlusive disease ([place]) 04/23/2015  . PAD (peripheral artery disease) ([place])   . Critical lower limb ischemia   . Toe ulcer, right ([place])   . Type II or unspecified type diabetes mellitus without mention of complication, uncontrolled 11/27/2013  . Acute respiratory failure with hypoxia ([place]) 11/26/2013  . CAP (community acquired pneumonia) 11/26/2013  . CHF exacerbation ([place]) 11/25/2013  . Cardiomyopathy, ischemic 06/13/2013  . At risk for sudden cardiac death 2013/06/21  .  S/P cardiac catheterization, Rt & Lt heart cath 06/05/2013 06/07/2013  . Acute on chronic combined systolic and diastolic HF (heart failure), NYHA class 3 ([place]) 06/04/2013  . Atrial flutter with rapid ventricular response ([place]) 06/02/2013  . Acute renal failure: Cr up to 1.8 06/02/2013  . Hyperkalemia 06/02/2013    Class: Acute  . LBBB (left bundle branch block) 06/01/2013  . Hypotension- secondary to AF and Diltiazem Rx 06/01/2013  . Obesity (BMI 30-39.9) 06/01/2013  . Acute on chronic diastolic heart failure - due to Afib AVR 06/01/2013  . Atrial fibrillation with RVR- converted with Amiodarone 05/31/2013  . Stroke- Lt brain 2003 04/08/2013  . Hemiplegia affecting right dominant side ([place]) 04/08/2013  . Cataract 04/08/2013  . GERD (gastroesophageal reflux disease) 04/08/2013  . S/P CABG x 3 - 2010 04/08/2013  . S/P AVR-tissue, 2010 04/08/2013  . DM2 (diabetes mellitus, type 2) ([place]) 04/08/2013  . Dyslipidemia  04/08/2013  . SUBACROMIAL BURSITIS, LEFT 11/05/2009   Ihor Austin, LPTA; CBIS [phone]  Aldona Lento 12/17/2015, 10:27 AM  [place], [name], [address] Phone: [phone]   Fax:  [phone]  Name: [name] MRN: [id number] Date of Birth: 10/31/1945

## 2015-12-21 DIAGNOSIS — L97511 Non-pressure chronic ulcer of other part of right foot limited to breakdown of skin: Secondary | ICD-10-CM | POA: Diagnosis not present

## 2015-12-22 ENCOUNTER — Ambulatory Visit (HOSPITAL_COMMUNITY): Payer: Medicare Other | Admitting: Physical Therapy

## 2015-12-22 DIAGNOSIS — L97111 Non-pressure chronic ulcer of right thigh limited to breakdown of skin: Secondary | ICD-10-CM | POA: Diagnosis not present

## 2015-12-22 DIAGNOSIS — R2681 Unsteadiness on feet: Secondary | ICD-10-CM

## 2015-12-22 DIAGNOSIS — R262 Difficulty in walking, not elsewhere classified: Secondary | ICD-10-CM

## 2015-12-22 DIAGNOSIS — S81801D Unspecified open wound, right lower leg, subsequent encounter: Secondary | ICD-10-CM | POA: Diagnosis not present

## 2015-12-22 DIAGNOSIS — L97513 Non-pressure chronic ulcer of other part of right foot with necrosis of muscle: Secondary | ICD-10-CM | POA: Diagnosis not present

## 2015-12-22 NOTE — Therapy (Signed)
[name], [name], [address] Phone: [phone]   Fax:  [phone]  Wound Care Therapy/ Evaluation for Rt  Knee wound   Patient Details  Name: [name] MRN: [id number] Date of Birth: 07-10-1946 No Data Recorded  Encounter Date: 12/22/2015      PT End of Session - 12/22/15 0946    Visit Number 17   Number of Visits 24   Date for PT Re-Evaluation 01/21/16   Authorization Type UHC Medicare: Gcode and reassessment complete visit 61   Authorization - Visit Number 66   Authorization - Number of Visits 30   PT Start Time 0815   PT Stop Time 0920   PT Time Calculation (min) 65 min   Activity Tolerance Patient tolerated treatment well      Past Medical History  Diagnosis Date  . Hypertension   . Diabetes mellitus     type 2   . History of valve replacement 2010    [facility] Ease bioprosthetic 70m  . Thyroid disease   . Stroke ([facility] 2003    R-sided weakness & upper extremity swelling   . Coronary artery disease   . Dyslipidemia   . At risk for sudden cardiac death 110/31/2014 . PA[place]ox[place]mal [place] 05[place]    converted with amiodarone to SR  . Chronic anticoagulation 05/2013    started  . S/P cardiac catheterization, Rt & Lt heart cath 06/05/2013 110/31/14 . S/P CABG x 3 2010  . GERD (gastroesophageal reflux disease)   . Cataract   . Chronic kidney disease     Patient reports he is seeing Kidney doctor in September    Past Surgical History  Procedure Laterality Date  . Coronary artery bypass graft  01/26/2009    LIMA to LAD, SVG to circumflex, SVG to PDA  . [facility]  . Transthoracic echocardiogram  09/2011    grade 1 diastolic dysfunction; increasing valve gradient; calcified MV annulus   . Cardiac catheterization  06/05/2013    native 3 vessel disease; patent LIMA to LAD, SVG to OM and SVG  to PDA; Elevated right and left heart filling pressures, with predominantly pulmonary venous hypertension, normal PVR  . Transthoracic echocardiogram  06/03/2013    EF 25-30%, mod conc. hypertrophy, grade 3 diastolic dysfunction; LA mod dilated; calcified MV annulus; transaortic gradients are normal for the bioprosthetic valve; inf vena cava dilated (elevated CVP) - LifeVest  . Left and right heart catheterization with coronary angiogram N/A 06/05/2013    Procedure: LEFT AND RIGHT HEART CATHETERIZATION WITH CORONARY ANGIOGRAM;  Surgeon: DaLeonie ManMD;  [facility];  Service: Cardiovascular;  Laterality: N/A;  . Graft(s) angiogram  06/05/2013    Procedure: GRAFT(S) ANCyril Loosen Surgeon: DaLeonie ManMD;  [facility];  Service: Cardiovascular;;  . Colonoscopy  2007    Dr. MaAviva Signsinternal hemorrhoids    There were no vitals filed for this visit.                  Wound Therapy - 12/22/15 0917    Subjective Pt has a new order for wound care on his Right knee.  Pt states that he fell on 12/04/2015 and skinned her knee.  Pt states that the wound has not healed.  He went to the MD who placed him on antibiotic and referred  him for wound care.    Patient and Family Stated Goals wound to heal    Pain Assessment No/denies pain   Wound Properties Date First Assessed: 12/22/15 Time First Assessed: 0831 Wound Type: Other (Comment) Location: Knee Location Orientation: Right;Anterior Wound Description (Comments): abrasion  Present on Admission: Yes   Dressing Type Gauze (Comment)   Dressing Changed Changed   Dressing Status Old drainage   Dressing Change Frequency PRN   Site / Wound Assessment Dry;Brown   % Wound base Red or Granulating 25%   % Wound base Yellow 75%   Peri-wound Assessment Erythema (blanchable)   Wound Length (cm) 3 cm   Wound Width (cm) 5 cm   Wound Depth (cm) 0 cm   Drainage Amount Scant   Treatment Cleansed;Debridement (Selective)    Wound Properties Date First Assessed: 06/23/15 Time First Assessed: 0900 Wound Type: Other (Comment) Location: Toe (Comment  which one) Location Orientation: Right Wound Description (Comments): medial  aspect of Rt great toe Present on Admission: No   Dressing Type Silver hydrofiber   Dressing Changed Changed   Dressing Status Old drainage   Dressing Change Frequency PRN   % Wound base Red or Granulating --  98   % Wound base Yellow --  2%   Peri-wound Assessment Erythema (blanchable)   Wound Length (cm) 1.5 cm  was 1.3 but had undermining   Wound Width (cm) 1.5 cm   Undermining (cm) none   was undermining 80% of the wound periphery    Margins Epibole (rolled edges)   Drainage Amount Minimal   Drainage Description Serosanguineous   Treatment Cleansed;Debridement (Selective)   Incision Properties Date First Assessed: 04/23/15 Time First Assessed: 1738 Location: Groin Location Orientation: Left Present on Admission: No   Selective Debridement - Location Extensive debridement of epiboled edges of toe.  Debrided slough from knee wound    Selective Debridement - Tools Used Forceps;Scalpel;Scissors   Selective Debridement - Tissue Removed slough, callous    Wound Therapy - Clinical Statement Mr. Nims is a 70yo male with history of CVA affecting both his Rt UE and LE and diabetes.  Mr. Remache lives alone and due to his CVA does not have the dexterity, ROM or strength to properly care for wounds on his LE.  He has been being seen by skilled physical therapy for a chronic toe wound on his RT LE that would not heal due to arterial insufficency.  Approximately two months ago he recieved a stent in his LE which has caused this wound to go from regressing to where it is showing slow progression.  Due to his previous stroke he is unstable on his feet and feel approximately two weeks ago on his right knee.  The wound had not healed and was turning red along the edges of the wound therefore he went to his  MD who ordered antibiotics and referred him to skilled physical therapy. Mr. Dung will benefit from skilled physical therapy to ensure that his wounds heal without complication of infection.     Wound Therapy - Functional Problem List dificulty walking, dificulty wearing shoes    Factors Delaying/Impairing Wound Healing Altered sensation;Diabetes Mellitus;Infection - systemic/local;Immobility;Multiple medical problems;Vascular compromise   Hydrotherapy Plan Debridement;Dressing change;Patient/family education   Wound Therapy - Current Recommendations PT   Wound Plan Cleansing of Rt LE; Continue to debride epiboled edges of Rt  great toe as well as kne.  Dressing chage using silverhydrofiber and tape to prevent hypertrophic tissue to Rt great  toe; Medihonye, 4x4 and kerlix to knee to keep antibacterial and moisture.                         great toe; deb   Decrease Necrotic Tissue to 20%   Decrease Necrotic Tissue - Progress Met   Increase Granulation Tissue to 80%   Increase Granulation Tissue - Progress Met   Decrease Length/Width/Depth by (cm) 1.0    Decrease Length/Width/Depth - Progress Not met  due to decreasing undermining a   Improve Drainage Characteristics Min   Improve Drainage Characteristics - Progress Met   Patient/Family will be able to  independent in wound changing   Patient/Family Instruction Goal - Progress Discontinued (comment)  Pt will not be able to do this secondary to stroke    Additional Wound Therapy Goal full healing of both toe and knee wound    Additional Wound Therapy Goal - Progress Progressing toward goal   Goals/treatment plan/discharge plan were made with and agreed upon by patient/family Yes   Time For Goal Achievement Other (comment)  6 weeks ( toe 6 weeks, knee 3 weeks )   Wound Therapy - Potential for Goals Fair                      PT Long Term Goals - 01/15/16 0941    PT LONG TERM GOAL #1   Title Pt to be able to wear all shoes  without Rt great toe irritation    Time 6   Period Weeks   Status New   PT LONG TERM GOAL #2   Title Pt to be able to wear slacks without irritation of Rt knee.   Time 3   Period Weeks   Status New             Patient will benefit from skilled therapeutic intervention in order to improve the following deficits and impairments:     Visit Diagnosis: Difficulty in walking, not elsewhere classified  Unsteadiness on feet  Non-pressure chronic ulcer of other part of right foot with necrosis of muscle (HCC)  Non-pressure chronic ulcer of right thigh, limited to breakdown of skin ([place])      G-Codes - 2016/01/15 0949    Functional Assessment Tool Used based on skilled clinical assessment of wound healling plus new wound    Functional Limitation Other PT primary   Other PT Primary Current Status (A0762) At least 40 percent but less than 60 percent impaired, limited or restricted   Other PT Primary Goal Status (U6333) At least 1 percent but less than 20 percent impaired, limited or restricted       Problem List Patient Active Problem List   Diagnosis Date Noted  . Rectal bleeding 07/28/2015  . Chronic anticoagulation 07/28/2015  . PVD (peripheral vascular disease) ([place])   . S/P angioplasty 04/23/2015  . Peripheral arterial occlusive disease ([place]) 04/23/2015  . PAD (peripheral artery disease) ([place])   . Critical lower limb ischemia   . Toe ulcer, right ([place])   . Type II or unspecified type diabetes mellitus without mention of complication, uncontrolled 11/27/2013  . Acute respiratory failure with hypoxia ([name]) 11/26/2013  . CAP (community acquired pneumonia) 11/26/2013  . CHF exacerbation ([place]) 11/25/2013  . Cardiomyopathy, ischemic 06/13/2013  . At risk for sudden cardiac death 2013-07-01  . S/P cardiac catheterization, Rt & Lt heart cath 06/05/2013 Jul 01, 2013  . Acute on chronic combined systolic and diastolic HF (heart  failure), NYHA class 3 ([place]) 06/04/2013  . Atrial  flutter with rapid ventricular response ([place]) 06/02/2013  . Acute renal failure: Cr up to 1.8 06/02/2013  . Hyperkalemia 06/02/2013    Class: Acute  . LBBB (left bundle branch block) 06/01/2013  . Hypotension- secondary to AF and Diltiazem Rx 06/01/2013  . Obesity (BMI 30-39.9) 06/01/2013  . Acute on chronic diastolic heart failure - due to Afib AVR 06/01/2013  . Atrial fibrillation with RVR- converted with Amiodarone 05/31/2013  . Stroke- Lt brain 2003 04/08/2013  . Hemiplegia affecting right dominant side ([place]) 04/08/2013  . Cataract 04/08/2013  . GERD (gastroesophageal reflux disease) 04/08/2013  . S/P CABG x 3 - 2010 04/08/2013  . S/P AVR-tissue, 2010 04/08/2013  . DM2 (diabetes mellitus, type 2) ([place]) 04/08/2013  . Dyslipidemia 04/08/2013  . SUBACROMIAL BURSITIS, LEFT 11/05/2009   Rayetta Humphrey, PT CLT [phone] 12/22/2015, 10:03 AM  [place] [address] [place], [name], [address] Phone: [phone]   Fax:  [phone]  Name: ROMALDO SAVILLE MRN: [id number] Date of Birth: 10/24/1945

## 2015-12-23 ENCOUNTER — Ambulatory Visit: Payer: Self-pay

## 2015-12-23 ENCOUNTER — Other Ambulatory Visit: Payer: Self-pay

## 2015-12-23 NOTE — Patient Outreach (Signed)
[facility]) Care Management  12/23/2015  [name] 06/09/1946 [id number]   Telephone call to patient for monthly outreach.  No answer.  HIPAA compliant voice message left.    Plan: RN Health Coach will attempt patient again within 1-2 weeks.    [name], RN, MSN [place] [phone]

## 2015-12-24 ENCOUNTER — Ambulatory Visit (HOSPITAL_COMMUNITY): Payer: Medicare Other | Admitting: Physical Therapy

## 2015-12-24 DIAGNOSIS — L97111 Non-pressure chronic ulcer of right thigh limited to breakdown of skin: Secondary | ICD-10-CM | POA: Diagnosis not present

## 2015-12-24 DIAGNOSIS — S81801D Unspecified open wound, right lower leg, subsequent encounter: Secondary | ICD-10-CM | POA: Diagnosis not present

## 2015-12-24 DIAGNOSIS — R262 Difficulty in walking, not elsewhere classified: Secondary | ICD-10-CM | POA: Diagnosis not present

## 2015-12-24 DIAGNOSIS — R2681 Unsteadiness on feet: Secondary | ICD-10-CM

## 2015-12-24 DIAGNOSIS — L97513 Non-pressure chronic ulcer of other part of right foot with necrosis of muscle: Secondary | ICD-10-CM | POA: Diagnosis not present

## 2015-12-24 NOTE — Therapy (Signed)
[place], [name], [address] Phone: [phone]   Fax:  [phone]  Wound Care Therapy  Patient Details  Name: [name] MRN: [id number] Date of Birth: [birth year] No Data Recorded  Encounter Date: 12/24/2015      PT End of Session - 12/24/15 1126    Visit Number 45   Number of Visits 60   Date for PT Re-Evaluation 01/21/16   Authorization Type [employer] Medicare: Gcode and reassessment complete visit 67   Authorization - Visit Number 67   Authorization - Number of Visits 78   PT Start Time 1000   PT Stop Time 1025   PT Time Calculation (min) 25 min   Activity Tolerance Patient tolerated treatment well      Past Medical History  Diagnosis Date  . Hypertension   . Diabetes mellitus     type 2   . History of valve replacement 2010    [facility] Ease bioprosthetic 34mm  . Thyroid disease   . Stroke [facility]) 2003    R-sided weakness & upper extremity swelling   . Coronary artery disease   . Dyslipidemia   . At risk for sudden cardiac death 07/02/13  . PAF (paroxysmal atrial fibrillation) ([place]) 05/2013    converted with amiodarone to SR  . Chronic anticoagulation 05/2013    started  . S/P cardiac catheterization, Rt & Lt heart cath 06/05/2013 07-02-13  . S/P CABG x 3 2010  . GERD (gastroesophageal reflux disease)   . Cataract   . Chronic kidney disease     Patient reports he is seeing Kidney doctor in September    Past Surgical History  Procedure Laterality Date  . Coronary artery bypass graft  01/26/2009    LIMA to LAD, SVG to circumflex, SVG to PDA  . Aortic valve replacement  01/26/2009    [facility] Ease bioprosthetic 6mm valve  . Transthoracic echocardiogram  09/2011    grade 1 diastolic dysfunction; increasing valve gradient; calcified MV annulus   . Cardiac catheterization  06/05/2013    native 3 vessel disease; patent LIMA to LAD, SVG to OM and SVG to PDA; Elevated right and  left heart filling pressures, with predominantly pulmonary venous hypertension, normal PVR  . Transthoracic echocardiogram  06/03/2013    EF 25-30%, mod conc. hypertrophy, grade 3 diastolic dysfunction; LA mod dilated; calcified MV annulus; transaortic gradients are normal for the bioprosthetic valve; inf vena cava dilated (elevated CVP) - LifeVest  . Left and right heart catheterization with coronary angiogram N/A 06/05/2013    Procedure: LEFT AND RIGHT HEART CATHETERIZATION WITH CORONARY ANGIOGRAM;  Surgeon: Leonie Man, MD;  Location: [facility] CATH LAB;  Service: Cardiovascular;  Laterality: N/A;  . Graft(s) angiogram  06/05/2013    Procedure: GRAFT(S) Cyril Loosen;  Surgeon: Leonie Man, MD;  Location: [facility] CATH LAB;  Service: Cardiovascular;;  . Colonoscopy  2007    Dr. Aviva Signs: internal hemorrhoids    There were no vitals filed for this visit.       Subjective Assessment - 12/24/15 1032    Subjective Pt states he is doing well.  went back to MD on Monday and he dressed my toe.                   Wound Therapy - 12/24/15 1033    Subjective Pt states he went back to the MD on Monday about his toe (seen [place]).  Not sure what  they did to it but stuck a qtip to it and redressed it.  Thinking they treated with silver nitrate as wound is not as hypergranulated as last time.   Patient and Family Stated Goals wound to heal    Pain Assessment No/denies pain   Wound Properties Date First Assessed: 12/22/15 Time First Assessed: 0831 Wound Type: Other (Comment) Location: Knee Location Orientation: Right;Anterior Wound Description (Comments): abrasion  Present on Admission: Yes   Dressing Type Gauze (Comment)   Dressing Changed Changed   Dressing Status Old drainage   Dressing Change Frequency PRN   Site / Wound Assessment Dry;Brown   % Wound base Red or Granulating 25%   % Wound base Yellow 75%   Peri-wound Assessment Erythema (blanchable)   Drainage Amount Scant   Treatment  Cleansed;Debridement (Selective)   Wound Properties Date First Assessed: 06/23/15 Time First Assessed: 0900 Wound Type: Other (Comment) Location: Toe (Comment  which one) Location Orientation: Right Wound Description (Comments): medial  aspect of Rt great toe Present on Admission: No   Dressing Type Silver hydrofiber   Dressing Changed Changed   Dressing Status Old drainage   Dressing Change Frequency PRN   % Wound base Red or Granulating --  98   % Wound base Yellow --  2%   Peri-wound Assessment Erythema (blanchable)   Margins Epibole (rolled edges)   Drainage Amount Minimal   Drainage Description Serosanguineous   Treatment Cleansed;Debridement (Selective)   Incision Properties Date First Assessed: 04/23/15 Time First Assessed: 1738 Location: Groin Location Orientation: Left Present on Admission: No   Selective Debridement - Location Extensive debridement of epiboled edges of toe.  Debrided slough from knee wound    Selective Debridement - Tools Used Forceps;Scalpel;Scissors   Selective Debridement - Tissue Removed slough, callous    Wound Therapy - Clinical Statement Noted improvement in Great toe wound with overall reduction of hypergranulation.  Returned to MD Monday to inspect wound and assume MD treated with silver nitrate.  Knee much improved as well with some dry scraped areas but not really with open tissue.  Cleansed both areas and redressed knee with xeroform, gauze and medipore.  Continued with silver hydrofiber to great toe due to drainage and maceration f/b medipore placed on stretch for compression.      Wound Therapy - Functional Problem List dificulty walking, dificulty wearing shoes    Factors Delaying/Impairing Wound Healing Altered sensation;Diabetes Mellitus;Infection - systemic/local;Immobility;Multiple medical problems;Vascular compromise   Hydrotherapy Plan Debridement;Dressing change;Patient/family education   Wound Therapy - Current Recommendations PT   Wound Plan  Continue to cleanse and debride with appropriate dressings to Rt knee and great toe.   Dressing  Cleansing debridement to Rt  great toe as well as knee.  Silverhydrofiber and tape to prevent hypertrophic tissue to Rt great toe.  xeroform to knee f/b medipore tape   Decrease Necrotic Tissue to 20%   Increase Granulation Tissue to 80%   Decrease Length/Width/Depth by (cm) 1.0    Improve Drainage Characteristics Min   Patient/Family will be able to  independent in wound changing   Additional Wound Therapy Goal full healing of both toe and knee wound    Goals/treatment plan/discharge plan were made with and agreed upon by patient/family Yes   Time For Goal Achievement Other (comment)  6 weeks ( toe 6 weeks, knee 3 weeks )   Wound Therapy - Potential for Goals Fair  PT Long Term Goals - 12/22/15 0941    PT LONG TERM GOAL #1   Title Pt to be able to wear all shoes without Rt great toe irritation    Time 6   Period Weeks   Status New   PT LONG TERM GOAL #2   Title Pt to be able to wear slacks without irritation of Rt knee.   Time 3   Period Weeks   Status New             Patient will benefit from skilled therapeutic intervention in order to improve the following deficits and impairments:     Visit Diagnosis: Difficulty in walking, not elsewhere classified  Unsteadiness on feet     Problem List Patient Active Problem List   Diagnosis Date Noted  . Rectal bleeding 07/28/2015  . Chronic anticoagulation 07/28/2015  . PVD (peripheral vascular disease) ([place])   . S/P angioplasty 04/23/2015  . Peripheral arterial occlusive disease ([place]) 04/23/2015  . PAD (peripheral artery disease) ([place])   . Critical lower limb ischemia   . Toe ulcer, right ([place])   . Type II or unspecified type diabetes mellitus without mention of complication, uncontrolled 11/27/2013  . Acute respiratory failure with hypoxia ([place]) 11/26/2013  . CAP (community acquired  pneumonia) 11/26/2013  . CHF exacerbation ([place]) 11/25/2013  . Cardiomyopathy, ischemic 06/13/2013  . At risk for sudden cardiac death 2013-06-29  . S/P cardiac catheterization, Rt & Lt heart cath 06/05/2013 06-29-2013  . Acute on chronic combined systolic and diastolic HF (heart failure), NYHA class 3 ([place]) 06/04/2013  . Atrial flutter with rapid ventricular response ([place]) 06/02/2013  . Acute renal failure: Cr up to 1.8 06/02/2013  . Hyperkalemia 06/02/2013    Class: Acute  . LBBB (left bundle branch block) 06/01/2013  . Hypotension- secondary to AF and Diltiazem Rx 06/01/2013  . Obesity (BMI 30-39.9) 06/01/2013  . Acute on chronic diastolic heart failure - due to Afib AVR 06/01/2013  . Atrial fibrillation with RVR- converted with Amiodarone 05/31/2013  . Stroke- Lt brain 2003 04/08/2013  . Hemiplegia affecting right dominant side ([place]) 04/08/2013  . Cataract 04/08/2013  . GERD (gastroesophageal reflux disease) 04/08/2013  . S/P CABG x 3 - 2010 04/08/2013  . S/P AVR-tissue, 2010 04/08/2013  . DM2 (diabetes mellitus, type 2) ([place]) 04/08/2013  . Dyslipidemia 04/08/2013  . SUBACROMIAL BURSITIS, LEFT 11/05/2009    Teena Irani, PTA/CLT [phone]  12/24/2015, 11:27 AM  [facility] [address] [place], [name], [address] Phone: [phone]   Fax:  [phone]  Name: [name] MRN: [id number] Date of Birth: 09/30/1945

## 2015-12-28 ENCOUNTER — Other Ambulatory Visit: Payer: Self-pay

## 2015-12-28 NOTE — Patient Outreach (Signed)
[place] [facility]) Care Management  [place]  12/28/2015   [name] 01/05/1946 [id number]  Subjective: Telephone call to patient for monthly call. Patient reports he is visiting his sister in the [place].  Patient reports that his sugars continue to go up and down.  Stressed to patient the importance of keeping sugars under control in order to aid foot healing.  He reports that his foot is ok and that he goes to the wound center tomorrow.  Patient reports he had a couple of falls one at his nephews home and then at home where he lost his balance.  Patient reports he guess he should have had his cane.  Advised patient that using appropriate assistive devices is important in preventing falls.  He verbalized understanding.  No concerns voiced.    Objective:   Encounter Medications:  Outpatient Encounter Prescriptions as of 12/28/2015  Medication Sig Note  . acetaminophen (TYLENOL) 500 MG tablet Take 1,000 mg by mouth 2 (two) times daily as needed for headache. For pain/headaches   . ALPRAZolam (XANAX) 1 MG tablet Take 1 mg by mouth 4 (four) times daily as needed for anxiety.    Marland Kitchen amiodarone (PACERONE) 200 MG tablet TAKE ONE (1) TABLET EACH DAY   . atorvastatin (LIPITOR) 20 MG tablet Take 1 tablet (20 mg total) by mouth daily at 6 PM.   . celecoxib (CELEBREX) 200 MG capsule Take 1 capsule by mouth every evening.    . clopidogrel (PLAVIX) 75 MG tablet Take 1 tablet (75 mg total) by mouth daily. (Patient taking differently: Take 75 mg by mouth every evening. )   . docusate sodium (COLACE) 100 MG capsule Take 100 mg by mouth 2 (two) times daily.     Marland Kitchen ELIQUIS 5 MG TABS tablet TAKE ONE TABLET TWICE DAILY   . fenofibrate (TRICOR) 145 MG tablet Take 145 mg by mouth daily.   . ferrous sulfate 325 (65 FE) MG EC tablet Take 325 mg by mouth daily with breakfast.   . gabapentin (NEURONTIN) 300 MG capsule Take 300 mg by mouth 2 (two) times daily.    Marland Kitchen  HYDROcodone-acetaminophen (NORCO) 10-325 MG per tablet Take 0.5 tablets by mouth 2 (two) times daily.    . hydrocortisone (PROCTOSOL HC) 2.5 % rectal cream Place 1 application rectally 2 (two) times daily. For 10 days   . insulin glargine (LANTUS) 100 UNIT/ML injection Inject 60-75 Units into the skin 2 (two) times daily. Takes 75 units in the morning and 60 units in the evening   . insulin lispro (HUMALOG) 100 UNIT/ML injection Inject 35-50 Units into the skin 3 (three) times daily before meals.  02/11/2015: Takes 35 units with breakfast and 50 units with lunch and dinner  . levothyroxine (SYNTHROID, LEVOTHROID) 25 MCG tablet Take 25 mcg by mouth daily.     . metoprolol succinate (TOPROL-XL) 25 MG 24 hr tablet TAKE ONE (1) TABLET EACH DAY   . Omega-3 Fatty Acids (FISH OIL) 1200 MG CAPS Take 1,200 mg by mouth 2 (two) times daily.   . pantoprazole (PROTONIX) 40 MG tablet Take 1 tablet (40 mg total) by mouth 2 (two) times daily.   . silver sulfADIAZINE (SILVADENE) 1 % cream Apply 1 application topically 2 (two) times daily.    Marland Kitchen torsemide (DEMADEX) 10 MG tablet Take 1 tablet (10 mg total) by mouth daily.   . polyethylene glycol-electrolytes (NULYTELY/GOLYTELY) 420 G solution Take 4,000 mLs by mouth once. (Patient not taking: Reported  on 10/07/2015)    No facility-administered encounter medications on file as of 12/28/2015.    Functional Status:  In your present state of health, do you have any difficulty performing the following activities: 09/18/2015 08/21/2015  Hearing? N N  Vision? N N  Difficulty concentrating or making decisions? N N  Walking or climbing stairs? Y -  Dressing or bathing? N N  Doing errands, shopping? - Scientist, forensic and eating ? - N  Using the Toilet? - N  In the past six months, have you accidently leaked urine? - N  Do you have problems with loss of bowel control? - N  Managing your Medications? - N  Managing your Finances? - N  Housekeeping or managing your  Housekeeping? - Y    Fall/Depression Screening: PHQ 2/9 Scores 11/25/2015 11/02/2015 09/30/2015 09/21/2015 08/25/2015 08/21/2015 07/30/2015  PHQ - 2 Score 0 2 2 2  0 0 0  PHQ- 9 Score - 7 7 7  - - -    Assessment: Patient continues to benefit from health coach outreach for disease management and support.   Plan:  [facility] CM Care Plan Problem One        Most Recent Value   Care Plan Problem One  Falls   Role Documenting the Problem One  [place] for Problem One  Active   THN CM Short Term Goal #1 (0-30 days)  Patient will not have any falls within the next 30 days.    THN CM Short Term Goal #1 Start Date  12/28/15   Interventions for Short Term Goal #1  RN Health coach discussed EMMI Fall prevention information with patient.     THN CM Care Plan Problem Two        Most Recent Value   Care Plan Problem Two  Uncontrolled diabetes as evidenced by elevated CBGs, HGA1C and nonhealing wound.   Role Documenting the Problem Two  [place] for Problem Two  Active   Interventions for Problem Two Long Term Goal   RN Health Coach reiterated with patient signs of wound infection and importance of protein in his diet to promote healing.    THN Long Term Goal (31-90) days  Patient stated goal "I want my foot to heal", Patient foot will show improvement to wound over next 90 days   THN Long Term Goal Start Date  12/28/15 [Goal continued]   THN CM Short Term Goal #3 (0-30 days)  Patient CBG's will be under 200 within the next 30 days   THN CM Short Term Goal #3 Start Date  12/28/15 [goal continued]   Interventions for Short Term Goal #3  [place] reiterated with patient the importance of limiting carbohydrates and sweets.  Also reviewed goal of keeping sugars less than 200.        RN Health Coach will contact patient within one month and patient agrees to next outreach.    [name], RN, MSN [place] [phone]

## 2015-12-29 ENCOUNTER — Ambulatory Visit (HOSPITAL_COMMUNITY): Payer: Medicare Other | Admitting: Physical Therapy

## 2015-12-29 ENCOUNTER — Ambulatory Visit: Payer: Medicare Other

## 2015-12-29 DIAGNOSIS — R262 Difficulty in walking, not elsewhere classified: Secondary | ICD-10-CM | POA: Diagnosis not present

## 2015-12-29 DIAGNOSIS — R2681 Unsteadiness on feet: Secondary | ICD-10-CM

## 2015-12-29 DIAGNOSIS — L97513 Non-pressure chronic ulcer of other part of right foot with necrosis of muscle: Secondary | ICD-10-CM | POA: Diagnosis not present

## 2015-12-29 DIAGNOSIS — L97111 Non-pressure chronic ulcer of right thigh limited to breakdown of skin: Secondary | ICD-10-CM

## 2015-12-29 DIAGNOSIS — S81801D Unspecified open wound, right lower leg, subsequent encounter: Secondary | ICD-10-CM | POA: Diagnosis not present

## 2015-12-29 NOTE — Therapy (Signed)
[place], [name], [address] Phone: [phone]   Fax:  [phone]  Wound Care Therapy  Patient Details  Name: [name] MRN: [id number] Date of Birth: 11/09/1945 No Data Recorded  Encounter Date: 12/29/2015      PT End of Session - 12/29/15 1058    Visit Number 19   Number of Visits 83   Date for PT Re-Evaluation 01/21/16   Authorization Type UHC Medicare: Gcode and reassessment complete visit 37   Authorization - Visit Number 1   Authorization - Number of Visits 71   PT Start Time 0900   PT Stop Time 0930   PT Time Calculation (min) 30 min   Activity Tolerance Patient tolerated treatment well   Behavior During Therapy WFL for tasks assessed/performed      Past Medical History  Diagnosis Date  . Hypertension   . Diabetes mellitus     type 2   . History of valve replacement 2010    [facility] Ease bioprosthetic 1mm  . Thyroid disease   . Stroke [facility]) 2003    R-sided weakness & upper extremity swelling   . Coronary artery disease   . Dyslipidemia   . At risk for sudden cardiac death 2013/06/16  . PAF (paroxysmal atrial fibrillation) ([place]) 05/2013    converted with amiodarone to SR  . Chronic anticoagulation 05/2013    started  . S/P cardiac catheterization, Rt & Lt heart cath 06/05/2013 June 16, 2013  . S/P CABG x 3 2010  . GERD (gastroesophageal reflux disease)   . Cataract   . Chronic kidney disease     Patient reports he is seeing Kidney doctor in September    Past Surgical History  Procedure Laterality Date  . Coronary artery bypass graft  01/26/2009    LIMA to LAD, SVG to circumflex, SVG to PDA  . Aortic valve replacement  01/26/2009    [facility] Ease bioprosthetic 41mm valve  . Transthoracic echocardiogram  09/2011    grade 1 diastolic dysfunction; increasing valve gradient; calcified MV annulus   . Cardiac catheterization  06/05/2013    native 3 vessel disease; patent  LIMA to LAD, SVG to OM and SVG to PDA; Elevated right and left heart filling pressures, with predominantly pulmonary venous hypertension, normal PVR  . Transthoracic echocardiogram  06/03/2013    EF 25-30%, mod conc. hypertrophy, grade 3 diastolic dysfunction; LA mod dilated; calcified MV annulus; transaortic gradients are normal for the bioprosthetic valve; inf vena cava dilated (elevated CVP) - LifeVest  . Left and right heart catheterization with coronary angiogram N/A 06/05/2013    Procedure: LEFT AND RIGHT HEART CATHETERIZATION WITH CORONARY ANGIOGRAM;  Surgeon: Leonie Man, MD;  Location: [facility] CATH LAB;  Service: Cardiovascular;  Laterality: N/A;  . Graft(s) angiogram  06/05/2013    Procedure: GRAFT(S) Cyril Loosen;  Surgeon: Leonie Man, MD;  Location: [facility] CATH LAB;  Service: Cardiovascular;;  . Colonoscopy  2007    Dr. Aviva Signs: internal hemorrhoids    There were no vitals filed for this visit.                  Wound Therapy - 12/29/15 1052    Subjective Pt [place].  sTates he thinks his knee is healed.  Goes to get circulations studies done this week also.     Patient and Family Stated Goals wound to heal    Pain  Assessment No/denies pain   Wound Properties Date First Assessed: 12/22/15 Time First Assessed: 0831 Wound Type: Other (Comment) Location: Knee Location Orientation: Right;Anterior Wound Description (Comments): abrasion  Present on Admission: Yes   Dressing Type Gauze (Comment)   Dressing Changed Changed   Dressing Status Old drainage   Dressing Change Frequency PRN   Site / Wound Assessment Dry;Brown   % Wound base Red or Granulating 100%   % Wound base Yellow 0%   Peri-wound Assessment Intact   Drainage Amount None   Treatment Cleansed;Debridement (Selective)   Wound Properties Date First Assessed: 06/23/15 Time First Assessed: 0900 Wound Type: Other (Comment) Location: Toe (Comment  which one) Location  Orientation: Right Wound Description (Comments): medial  aspect of Rt great toe Present on Admission: No   Dressing Type Silver hydrofiber   Dressing Changed Changed   Dressing Status Old drainage   Dressing Change Frequency PRN   % Wound base Red or Granulating --  98   % Wound base Yellow --  2%   Peri-wound Assessment Erythema (blanchable)   Wound Length (cm) 1 cm   Wound Width (cm) 1 cm   Margins Attached edges (approximated)   Drainage Amount Minimal   Drainage Description Serosanguineous   Treatment Cleansed;Debridement (Selective)   Incision Properties Date First Assessed: 04/23/15 Time First Assessed: 1738 Location: Groin Location Orientation: Left Present on Admission: No   Selective Debridement - Location debridement of epiboled edges of toe.  Debrided slough from knee wound    Selective Debridement - Tools Used Forceps;Scalpel;Scissors   Selective Debridement - Tissue Removed slough, callous    Wound Therapy - Clinical Statement Knee wounds now healed with removal of residual dry skin and scabs to ensure full healing beneath.  Instructed patient to keep an eye on this area but not needing dressings at this time.  Re measured Lt great toe wound with reduction of 0.5cm in both directions.  Less hypergranualtion and improved approximation in areas.  continued with silver hydrofiber as drains over time and to prevent maceration.    Wound Therapy - Functional Problem List dificulty walking, dificulty wearing shoes    Factors Delaying/Impairing Wound Healing Altered sensation;Diabetes Mellitus;Infection - systemic/local;Immobility;Multiple medical problems;Vascular compromise   Hydrotherapy Plan Debridement;Dressing change;Patient/family education   Wound Therapy - Current Recommendations PT   Wound Plan Continue to cleanse and debride with appropriate dressings to Rt knee and great toe.   Dressing  Cleansing debridement to Rt  great toe as well as knee.  Silverhydrofiber and tape to  prevent hypertrophic tissue to Rt great toe.  No dressing needed to Rt knee.   Decrease Necrotic Tissue to 20%   Increase Granulation Tissue to 80%   Decrease Length/Width/Depth by (cm) 1.0    Improve Drainage Characteristics Min   Patient/Family will be able to  independent in wound changing   Additional Wound Therapy Goal full healing of both toe and knee wound    Goals/treatment plan/discharge plan were made with and agreed upon by patient/family Yes   Time For Goal Achievement Other (comment)  6 weeks ( toe 6 weeks, knee 3 weeks )   Wound Therapy - Potential for Goals Fair                      PT Long Term Goals - 12/22/15 0941    PT LONG TERM GOAL #1   Title Pt to be able to wear all shoes without Rt great toe irritation  Time 6   Period Weeks   Status New   PT LONG TERM GOAL #2   Title Pt to be able to wear slacks without irritation of Rt knee.   Time 3   Period Weeks   Status New             Patient will benefit from skilled therapeutic intervention in order to improve the following deficits and impairments:     Visit Diagnosis: Difficulty in walking, not elsewhere classified  Unsteadiness on feet  Non-pressure chronic ulcer of other part of right foot with necrosis of muscle (HCC)  Non-pressure chronic ulcer of right thigh, limited to breakdown of skin [facility])     Problem List Patient Active Problem List   Diagnosis Date Noted  . Rectal bleeding 07/28/2015  . Chronic anticoagulation 07/28/2015  . PVD (peripheral vascular disease) ([place])   . S/P angioplasty 04/23/2015  . Peripheral arterial occlusive disease ([place]) 04/23/2015  . PAD (peripheral artery disease) ([place])   . Critical lower limb ischemia   . Toe ulcer, right ([place])   . Type II or unspecified type diabetes mellitus without mention of complication, uncontrolled 11/27/2013  . Acute respiratory failure with hypoxia ([place]) 11/26/2013  . CAP (community acquired pneumonia) 11/26/2013   . CHF exacerbation ([place]) 11/25/2013  . Cardiomyopathy, ischemic 06/13/2013  . At risk for sudden cardiac death 2013/06/22  . S/P cardiac catheterization, Rt & Lt heart cath 06/05/2013 2013/06/22  . Acute on chronic combined systolic and diastolic HF (heart failure), NYHA class 3 ([place]) 06/04/2013  . Atrial flutter with rapid ventricular response ([place]) 06/02/2013  . Acute renal failure: Cr up to 1.8 06/02/2013  . Hyperkalemia 06/02/2013    Class: Acute  . LBBB (left bundle branch block) 06/01/2013  . Hypotension- secondary to AF and Diltiazem Rx 06/01/2013  . Obesity (BMI 30-39.9) 06/01/2013  . Acute on chronic diastolic heart failure - due to Afib AVR 06/01/2013  . Atrial fibrillation with RVR- converted with Amiodarone 05/31/2013  . Stroke- Lt brain 2003 04/08/2013  . Hemiplegia affecting right dominant side ([place]) 04/08/2013  . Cataract 04/08/2013  . GERD (gastroesophageal reflux disease) 04/08/2013  . S/P CABG x 3 - 2010 04/08/2013  . S/P AVR-tissue, 2010 04/08/2013  . DM2 (diabetes mellitus, type 2) ([place]) 04/08/2013  . Dyslipidemia 04/08/2013  . SUBACROMIAL BURSITIS, LEFT 11/05/2009    Teena Irani, PTA/CLT [phone]  12/29/2015, 10:59 AM  [place], [name], [address] Phone: [phone]   Fax:  [phone]  Name: [name] MRN: [id number] Date of Birth: Nov 10, 1945

## 2015-12-31 ENCOUNTER — Ambulatory Visit (HOSPITAL_COMMUNITY): Payer: Medicare Other | Admitting: Physical Therapy

## 2015-12-31 DIAGNOSIS — S81801D Unspecified open wound, right lower leg, subsequent encounter: Secondary | ICD-10-CM | POA: Diagnosis not present

## 2015-12-31 DIAGNOSIS — L97111 Non-pressure chronic ulcer of right thigh limited to breakdown of skin: Secondary | ICD-10-CM | POA: Diagnosis not present

## 2015-12-31 DIAGNOSIS — R2681 Unsteadiness on feet: Secondary | ICD-10-CM

## 2015-12-31 DIAGNOSIS — R262 Difficulty in walking, not elsewhere classified: Secondary | ICD-10-CM | POA: Diagnosis not present

## 2015-12-31 DIAGNOSIS — L97513 Non-pressure chronic ulcer of other part of right foot with necrosis of muscle: Secondary | ICD-10-CM

## 2015-12-31 NOTE — Therapy (Signed)
[facility] [address] [place], [name], [address] Phone: [phone]   Fax:  [phone]  Wound Care Therapy  Patient Details  Name: [name] MRN: [id number] Date of Birth: November 02, 1945 No Data Recorded  Encounter Date: 12/31/2015      PT End of Session - 12/31/15 1433    Visit Number 54   Number of Visits 58   Date for PT Re-Evaluation 01/21/16   Authorization Type UHC Medicare: Gcode and reassessment complete visit 61   Authorization - Visit Number 69   Authorization - Number of Visits 27   PT Start Time 0905   PT Stop Time 0930   PT Time Calculation (min) 25 min   Activity Tolerance Patient tolerated treatment well   Behavior During Therapy [facility] for tasks assessed/performed      Past Medical History  Diagnosis Date  . Hypertension   . Diabetes mellitus     type 2   . History of valve replacement 2010    [facility] Ease bioprosthetic 61mm  . Thyroid disease   . Stroke [facility]) 2003    R-sided weakness & upper extremity swelling   . Coronary artery disease   . Dyslipidemia   . At risk for sudden cardiac death 06/14/13  . PAF (paroxysmal atrial fibrillation) ([name]) 05/2013    converted with amiodarone to SR  . Chronic anticoagulation 05/2013    started  . S/P cardiac catheterization, Rt & Lt heart cath 06/05/2013 06/14/2013  . S/P CABG x 3 2010  . GERD (gastroesophageal reflux disease)   . Cataract   . Chronic kidney disease     Patient reports he is seeing Kidney doctor in September    Past Surgical History  Procedure Laterality Date  . Coronary artery bypass graft  01/26/2009    LIMA to LAD, SVG to circumflex, SVG to PDA  . Aortic valve replacement  01/26/2009    [facility] Ease bioprosthetic 52mm valve  . Transthoracic echocardiogram  09/2011    grade 1 diastolic dysfunction; increasing valve gradient; calcified MV annulus   . Cardiac catheterization  06/05/2013    native 3 vessel disease; patent  LIMA to LAD, SVG to OM and SVG to PDA; Elevated right and left heart filling pressures, with predominantly pulmonary venous hypertension, normal PVR  . Transthoracic echocardiogram  06/03/2013    EF 25-30%, mod conc. hypertrophy, grade 3 diastolic dysfunction; LA mod dilated; calcified MV annulus; transaortic gradients are normal for the bioprosthetic valve; inf vena cava dilated (elevated CVP) - LifeVest  . Left and right heart catheterization with coronary angiogram N/A 06/05/2013    Procedure: LEFT AND RIGHT HEART CATHETERIZATION WITH CORONARY ANGIOGRAM;  Surgeon: Leonie Man, MD;  Location: [facility] CATH LAB;  Service: Cardiovascular;  Laterality: N/A;  . Graft(s) angiogram  06/05/2013    Procedure: GRAFT(S) Cyril Loosen;  Surgeon: Leonie Man, MD;  Location: [facility] CATH LAB;  Service: Cardiovascular;;  . Colonoscopy  2007    Dr. Aviva Signs: internal hemorrhoids    There were no vitals filed for this visit.                  Wound Therapy - 12/31/15 1432    Subjective Pt states he is doing well.  States he missed his MD appt., slept right through it.   Patient and Family Stated Goals wound to heal    Wound Properties Date First Assessed: 12/22/15 Time First Assessed: 0831 Wound Type: Other (Comment)  Location: Knee Location Orientation: Right;Anterior Wound Description (Comments): abrasion  Present on Admission: Yes Final Assessment Date: 12/31/15   Wound Properties Date First Assessed: 06/23/15 Time First Assessed: 0900 Wound Type: Other (Comment) Location: Toe (Comment  which one) Location Orientation: Right Wound Description (Comments): medial  aspect of Rt great toe Present on Admission: No   Dressing Type Silver hydrofiber   Dressing Status Old drainage   Dressing Change Frequency PRN   % Wound base Red or Granulating --  98   % Wound base Yellow --  2%   Peri-wound Assessment Erythema (blanchable)   Margins Attached edges (approximated)   Drainage Amount Minimal    Drainage Description Serosanguineous   Treatment Cleansed;Debridement (Selective)   Incision Properties Date First Assessed: 04/23/15 Time First Assessed: 1738 Location: Groin Location Orientation: Left Present on Admission: No   Selective Debridement - Location debridement of epiboled edges of toe.  Debrided slough from knee wound    Selective Debridement - Tools Used Forceps;Scalpel;Scissors   Selective Debridement - Tissue Removed slough, callous    Wound Therapy - Clinical Statement Knee wounds now healed and wound completed.    Instructed patient to continue to check this area.  Great toe wound continues to improve with  less hypergranualtion and improved approximation in areas.  continued with silver hydrofiber as drains over time and to prevent maceration.    Wound Therapy - Functional Problem List dificulty walking, dificulty wearing shoes    Factors Delaying/Impairing Wound Healing Altered sensation;Diabetes Mellitus;Infection - systemic/local;Immobility;Multiple medical problems;Vascular compromise   Hydrotherapy Plan Debridement;Dressing change;Patient/family education   Wound Therapy - Current Recommendations PT   Wound Plan Continue to cleanse and debride with appropriate dressings to Rt knee and great toe.   Dressing  Cleansing debridement to Rt  great toe as well as knee.  Silverhydrofiber and tape to prevent hypertrophic tissue to Rt great toe.  No dressing needed to Rt knee.   Decrease Necrotic Tissue to 20%   Increase Granulation Tissue to 80%   Decrease Length/Width/Depth by (cm) 1.0    Improve Drainage Characteristics Min   Patient/Family will be able to  independent in wound changing   Additional Wound Therapy Goal full healing of both toe and knee wound    Goals/treatment plan/discharge plan were made with and agreed upon by patient/family Yes   Time For Goal Achievement Other (comment)  6 weeks ( toe 6 weeks, knee 3 weeks )   Wound Therapy - Potential for Goals Fair                       PT Long Term Goals - 12/22/15 0941    PT LONG TERM GOAL #1   Title Pt to be able to wear all shoes without Rt great toe irritation    Time 6   Period Weeks   Status New   PT LONG TERM GOAL #2   Title Pt to be able to wear slacks without irritation of Rt knee.   Time 3   Period Weeks   Status New             Patient will benefit from skilled therapeutic intervention in order to improve the following deficits and impairments:     Visit Diagnosis: Difficulty in walking, not elsewhere classified  Unsteadiness on feet  Non-pressure chronic ulcer of other part of right foot with necrosis of muscle [facility])     Problem List Patient Active Problem List   Diagnosis  Date Noted  . Rectal bleeding 07/28/2015  . Chronic anticoagulation 07/28/2015  . PVD (peripheral vascular disease) ([place])   . S/P angioplasty 04/23/2015  . Peripheral arterial occlusive disease ([place]) 04/23/2015  . PAD (peripheral artery disease) ([place])   . Critical lower limb ischemia   . Toe ulcer, right ([place])   . Type II or unspecified type diabetes mellitus without mention of complication, uncontrolled 11/27/2013  . Acute respiratory failure with hypoxia ([place]) 11/26/2013  . CAP (community acquired pneumonia) 11/26/2013  . CHF exacerbation ([place]) 11/25/2013  . Cardiomyopathy, ischemic 06/13/2013  . At risk for sudden cardiac death 2013/06/18  . S/P cardiac catheterization, Rt & Lt heart cath 06/05/2013 2013/06/18  . Acute on chronic combined systolic and diastolic HF (heart failure), NYHA class 3 ([place]) 06/04/2013  . Atrial flutter with rapid ventricular response ([place]) 06/02/2013  . Acute renal failure: Cr up to 1.8 06/02/2013  . Hyperkalemia 06/02/2013    Class: Acute  . LBBB (left bundle branch block) 06/01/2013  . Hypotension- secondary to AF and Diltiazem Rx 06/01/2013  . Obesity (BMI 30-39.9) 06/01/2013  . Acute on chronic diastolic heart failure - due to Afib AVR  06/01/2013  . Atrial fibrillation with RVR- converted with Amiodarone 05/31/2013  . Stroke- Lt brain 2003 04/08/2013  . Hemiplegia affecting right dominant side ([place]) 04/08/2013  . Cataract 04/08/2013  . GERD (gastroesophageal reflux disease) 04/08/2013  . S/P CABG x 3 - 2010 04/08/2013  . S/P AVR-tissue, 2010 04/08/2013  . DM2 (diabetes mellitus, type 2) ([place]) 04/08/2013  . Dyslipidemia 04/08/2013  . SUBACROMIAL BURSITIS, LEFT 11/05/2009    Teena Irani, PTA/CLT [phone]  12/31/2015, 2:34 PM  [facility] [address] [place], [name], [address] Phone: [phone]   Fax:  [phone]  Name: MOATAZ KERWIN MRN: [id number] Date of Birth: 01/25/1946

## 2016-01-05 ENCOUNTER — Ambulatory Visit (HOSPITAL_COMMUNITY): Payer: Medicare Other | Admitting: Physical Therapy

## 2016-01-05 ENCOUNTER — Other Ambulatory Visit: Payer: Medicare Other

## 2016-01-05 DIAGNOSIS — R2681 Unsteadiness on feet: Secondary | ICD-10-CM | POA: Diagnosis not present

## 2016-01-05 DIAGNOSIS — L97111 Non-pressure chronic ulcer of right thigh limited to breakdown of skin: Secondary | ICD-10-CM | POA: Diagnosis not present

## 2016-01-05 DIAGNOSIS — L97513 Non-pressure chronic ulcer of other part of right foot with necrosis of muscle: Secondary | ICD-10-CM | POA: Diagnosis not present

## 2016-01-05 DIAGNOSIS — S81801D Unspecified open wound, right lower leg, subsequent encounter: Secondary | ICD-10-CM | POA: Diagnosis not present

## 2016-01-05 DIAGNOSIS — R262 Difficulty in walking, not elsewhere classified: Secondary | ICD-10-CM

## 2016-01-05 NOTE — Therapy (Signed)
[place], [name], [address] Phone: [phone]   Fax:  [phone]  Wound Care Therapy  Patient Details  Name: [name] MRN: [id number] Date of Birth: 1945-12-29 No Data Recorded  Encounter Date: 01/05/2016      PT End of Session - 01/05/16 0858    Visit Number 53   Number of Visits 60   Date for PT Re-Evaluation 02/04/16   Authorization Type UHC Medicare: Gcode and reassessment complete visit 94   Authorization - Visit Number 13   Authorization - Number of Visits 38   PT Start Time 0817   PT Stop Time 0850   PT Time Calculation (min) 33 min   Activity Tolerance Patient tolerated treatment well   Behavior During Therapy [facility] for tasks assessed/performed      Past Medical History  Diagnosis Date  . Hypertension   . Diabetes mellitus     type 2   . History of valve replacement 2010    [facility] Ease bioprosthetic 75m  . Thyroid disease   . Stroke ([facility] 2003    R-sided weakness & upper extremity swelling   . Coronary artery disease   . Dyslipidemia   . At risk for sudden cardiac death 1November 11, 2014 . PAF ([place]ox[place]mal [place]) [place]    converted with amiodarone to SR  . Chronic anticoagulation 05/2013    started  . S/P cardiac catheterization, Rt & Lt heart cath 06/05/2013 111/06/2013 . S/P CABG x 3 2010  . GERD (gastroesophageal reflux disease)   . Cataract   . Chronic kidney disease     Patient reports he is seeing Kidney doctor in September    Past Surgical History  Procedure Laterality Date  . Coronary artery bypass graft  01/26/2009    LIMA to LAD, SVG to circumflex, SVG to PDA  . [facility]ortic valve [facility]  [facility]/26/2009    [facility]  . Transthoracic echocardiogram  09/2011    grade 1 diastolic dysfunction; increasing valve gradient; calcified MV annulus   . Cardiac catheterization  06/05/2013    native 3 vessel disease; patent  LIMA to LAD, SVG to OM and SVG to PDA; Elevated right and left heart filling pressures, with predominantly pulmonary venous hypertension, normal PVR  . Transthoracic echocardiogram  06/03/2013    EF 25-30%, mod conc. hypertrophy, grade 3 diastolic dysfunction; LA mod dilated; calcified MV annulus; transaortic gradients are normal for the bioprosthetic valve; inf vena cava dilated (elevated CVP) - LifeVest  . Left and right heart catheterization with coronary angiogram N/A 06/05/2013    Procedure: LEFT AND RIGHT HEART CATHETERIZATION WITH CORONARY ANGIOGRAM;  Surgeon: DaLeonie ManMD;  [facility];  Service: Cardiovascular;  Laterality: N/A;  . Graft(s) angiogram  06/05/2013    Procedure: GRAFT(S) ANCyril Loosen Surgeon: DaLeonie ManMD;  [facility];  Service: Cardiovascular;;  . Colonoscopy  2007    Dr. MaAviva Signsinternal hemorrhoids    There were no vitals filed for this visit.                  Wound Therapy - 01/05/16 0851    Subjective Pt states that his toe has been itching but no pain    Patient and Family Stated Goals wound to heal    Wound Properties Date First Assessed: 06/23/15 Time First Assessed: 0900 Wound Type: Other (Comment) Location: Toe (Comment  which  one) Location Orientation: Right Wound Description (Comments): medial  aspect of Rt great toe Present on Admission: No   Dressing Type Impregnated gauze (petrolatum)   Dressing Changed Changed   Dressing Status Old drainage   Dressing Change Frequency PRN   % Wound base Red or Granulating 100%   % Wound base Yellow 0%   Peri-wound Assessment Erythema (blanchable)   Wound Length (cm) 0.6 cm  was 1.5 on 5/9; 1.o on 5/18.   Wound Width (cm) 1 cm   Wound Depth (cm) --  was 1.5 on 5/9; 1.0 on 5/18   Margins Attached edges (approximated)   Drainage Amount Scant  was minimal   Drainage Description Serosanguineous   Treatment Cleansed;Debridement (Selective)   Incision Properties  Date First Assessed: 04/23/15 Time First Assessed: 1738 Location: Groin Location Orientation: Left Present on Admission: No   Selective Debridement - Location debridement of epiboled edges of toe.  Callous periphery of wound   Selective Debridement - Tools Used Forceps;Scalpel;Scissors   Selective Debridement - Tissue Removed slough, callous    Wound Therapy - Clinical Statement Discontinue knee wound care as knee wound is healed.  Pt now only being seen for his toe wound.  Toe is looking much better but will continue to need skilled physical therapy until wound is totally healed due to pt past CVA preventing him to properly care for the wound and hx of DM and vascular insufficiency.   Wound Therapy - Functional Problem List dificulty walking, dificulty wearing shoes    Factors Delaying/Impairing Wound Healing Altered sensation;Diabetes Mellitus;Infection - systemic/local;Immobility;Multiple medical problems;Vascular compromise   Hydrotherapy Plan Debridement;Dressing change;Patient/family education   Wound Therapy - Current Recommendations PT   Wound Plan Continue to cleanse and debride with appropriate dressings to Rt  great toe.   Discontinue treatment on Rt knee .   Dressing  xeroform to Rt great toe followed by 2x2 and medipore tape with pressure to prevent hypertrophy of toe mm tissue our of the wound site.     Decrease Necrotic Tissue to 20%   Decrease Necrotic Tissue - Progress Met   Increase Granulation Tissue to 80%   Increase Granulation Tissue - Progress Met   Decrease Length/Width/Depth by (cm) 1.0    Decrease Length/Width/Depth - Progress Progressing toward goal   Improve Drainage Characteristics Min   Improve Drainage Characteristics - Progress Met   Patient/Family will be able to  independent in wound changing   Patient/Family Instruction Goal - Progress Discontinued (comment)   Additional Wound Therapy Goal full healing of both toe and knee wound    Additional Wound Therapy  Goal - Progress Progressing toward goal   Goals/treatment plan/discharge plan were made with and agreed upon by patient/family Yes   Time For Goal Achievement Other (comment)  6 weeks ( toe 6 weeks, knee 3 weeks )   Wound Therapy - Potential for Goals Fair                      PT Long Term Goals - 12/22/15 0941    PT LONG TERM GOAL #1   Title Pt to be able to wear all shoes without Rt great toe irritation    Time 6   Period Weeks   Status New   PT LONG TERM GOAL #2   Title Pt to be able to wear slacks without irritation of Rt knee.   Time 3   Period Weeks   Status New  Patient will benefit from skilled therapeutic intervention in order to improve the following deficits and impairments:     Visit Diagnosis: Difficulty in walking, not elsewhere classified  Unsteadiness on feet  Non-pressure chronic ulcer of other part of right foot with necrosis of muscle ([place])      G-Codes - 08-Jan-2016 0859    Functional Assessment Tool Used based on skilled clinical assessment of wound healling plus new wound    Functional Limitation Other PT primary   Other PT Primary Current Status (S9628) At least 20 percent but less than 40 percent impaired, limited or restricted   Other PT Primary Goal Status (Z6629) At least 1 percent but less than 20 percent impaired, limited or restricted       Problem List Patient Active Problem List   Diagnosis Date Noted  . Rectal bleeding 07/28/2015  . Chronic anticoagulation 07/28/2015  . PVD (peripheral vascular disease) ([place])   . S/P angioplasty 04/23/2015  . Peripheral arterial occlusive disease ([place]) 04/23/2015  . PAD (peripheral artery disease) ([place])   . Critical lower limb ischemia   . Toe ulcer, right ([place])   . Type II or unspecified type diabetes mellitus without mention of complication, uncontrolled 11/27/2013  . Acute respiratory failure with hypoxia ([place]) 11/26/2013  . CAP (community acquired pneumonia)  11/26/2013  . CHF exacerbation ([place]) 11/25/2013  . Cardiomyopathy, ischemic 06/13/2013  . At risk for sudden cardiac death 06-10-13  . S/P cardiac catheterization, Rt & Lt heart cath 06/05/2013 06/10/13  . Acute on chronic combined systolic and diastolic HF (heart failure), NYHA class 3 ([place]) 06/04/2013  . Atrial flutter with rapid ventricular response ([place]) 06/02/2013  . Acute renal failure: Cr up to 1.8 06/02/2013  . Hyperkalemia 06/02/2013    Class: Acute  . LBBB (left bundle branch block) 06/01/2013  . Hypotension- secondary to AF and Diltiazem Rx 06/01/2013  . Obesity (BMI 30-39.9) 06/01/2013  . Acute on chronic diastolic heart failure - due to Afib AVR 06/01/2013  . Atrial fibrillation with RVR- converted with Amiodarone 05/31/2013  . Stroke- Lt brain 2003 04/08/2013  . Hemiplegia affecting right dominant side ([place]) 04/08/2013  . Cataract 04/08/2013  . GERD (gastroesophageal reflux disease) 04/08/2013  . S/P CABG x 3 - 2010 04/08/2013  . S/P AVR-tissue, 2010 04/08/2013  . DM2 (diabetes mellitus, type 2) ([place]) 04/08/2013  . Dyslipidemia 04/08/2013  . SUBACROMIAL BURSITIS, LEFT 11/05/2009    Rayetta Humphrey, PT CLT [phone] 01-08-16, 9:00 AM  [address] [place], [name], [address] Phone: [phone]   Fax:  [phone]  Name: [name] MRN: [id number] Date of Birth: [birth year]

## 2016-01-07 ENCOUNTER — Ambulatory Visit (HOSPITAL_COMMUNITY): Payer: Medicare Other | Admitting: Physical Therapy

## 2016-01-07 DIAGNOSIS — S81801D Unspecified open wound, right lower leg, subsequent encounter: Secondary | ICD-10-CM | POA: Diagnosis not present

## 2016-01-07 DIAGNOSIS — R2681 Unsteadiness on feet: Secondary | ICD-10-CM | POA: Diagnosis not present

## 2016-01-07 DIAGNOSIS — L97513 Non-pressure chronic ulcer of other part of right foot with necrosis of muscle: Secondary | ICD-10-CM

## 2016-01-07 DIAGNOSIS — L97111 Non-pressure chronic ulcer of right thigh limited to breakdown of skin: Secondary | ICD-10-CM | POA: Diagnosis not present

## 2016-01-07 DIAGNOSIS — R262 Difficulty in walking, not elsewhere classified: Secondary | ICD-10-CM | POA: Diagnosis not present

## 2016-01-07 NOTE — Therapy (Signed)
[place] [address] [place], [name], [address] Phone: [phone]   Fax:  [phone]  Wound Care Therapy  Patient Details  Name: [name] MRN: [id number] Date of Birth: 06-30-1946 No Data Recorded  Encounter Date: 01/07/2016      PT End of Session - 01/07/16 1115    Visit Number 62   Number of Visits 43   Date for PT Re-Evaluation 02/04/16   Authorization Type UHC Medicare: Gcode and reassessment complete visit 21   Authorization - Visit Number 32   Authorization - Number of Visits 59   PT Start Time 0816   PT Stop Time 0838   PT Time Calculation (min) 22 min   Activity Tolerance Patient tolerated treatment well      Past Medical History  Diagnosis Date  . Hypertension   . Diabetes mellitus     type 2   . History of valve replacement 2010    [facility] Ease bioprosthetic 2m  . Thyroid disease   . Stroke ([facility] 2003    R-sided weakness & upper extremity swelling   . Coronary artery disease   . Dyslipidemia   . At risk for sudden cardiac death 12014/11/02 . [place]mal [place]) [place]    converted with amiodarone to SR  . Chronic anticoagulation 05/2013    started  . S/P cardiac catheterization, Rt & Lt heart cath 06/05/2013 1November 02, 2014 . S/P CABG x 3 2010  . GERD (gastroesophageal reflux disease)   . Cataract   . Chronic kidney disease     Patient reports he is seeing Kidney doctor in September    Past Surgical History  Procedure Laterality Date  . Coronary artery bypass graft  01/26/2009    LIMA to LAD, SVG to circumflex, SVG to PDA  . Aortic valve replacement  01/26/2009    [facility]  . Transthoracic echocardiogram  09/2011    grade 1 diastolic dysfunction; increasing valve gradient; calcified MV annulus   . Cardiac catheterization  06/05/2013    native 3 vessel disease; patent LIMA to LAD, SVG to OM and SVG to PDA; Elevated right and  left heart filling pressures, with predominantly pulmonary venous hypertension, normal PVR  . Transthoracic echocardiogram  06/03/2013    EF 25-30%, mod conc. hypertrophy, grade 3 diastolic dysfunction; LA mod dilated; calcified MV annulus; transaortic gradients are normal for the bioprosthetic valve; inf vena cava dilated (elevated CVP) - LifeVest  . Left and right heart catheterization with coronary angiogram N/A 06/05/2013    Procedure: LEFT AND RIGHT HEART CATHETERIZATION WITH CORONARY ANGIOGRAM;  Surgeon: DaLeonie ManMD;  [facility];  Service: Cardiovascular;  Laterality: N/A;  . Graft(s) angiogram  06/05/2013    Procedure: GRAFT(S) ANCyril Loosen Surgeon: DaLeonie ManMD;  [facility];  Service: Cardiovascular;;  . Colonoscopy  2007    Dr. MaAviva Signsinternal hemorrhoids    There were no vitals filed for this visit.                  Wound Therapy - 01/07/16 1109    Subjective Pt has no complaints states that his toe is doing well   Patient and Family Stated Goals wound to heal    Pain Assessment No/denies pain   Wound Properties Date First Assessed: 06/23/15 Time First Assessed: 0900 Wound Type: Other (Comment) Location: Toe (Comment  which one) Location Orientation: Right  Wound Description (Comments): medial  aspect of Rt great toe Present on Admission: No   Dressing Type Silver hydrofiber   Dressing Changed Changed   Dressing Status Old drainage   Dressing Change Frequency PRN   % Wound base Red or Granulating 100%   % Wound base Yellow 0%   Peri-wound Assessment Erythema (blanchable)   Wound Length (cm) 0.6 cm   Wound Width (cm) 0.9 cm   Wound Depth (cm) 0 cm   Margins Attached edges (approximated)   Drainage Amount Scant  was minimal   Drainage Description Serous   Treatment Cleansed;Debridement (Selective)   Incision Properties Date First Assessed: 04/23/15 Time First Assessed: 1738 Location: Groin Location Orientation: Left  Present on Admission: No   Selective Debridement - Location debridement of epiboled edges of toe.  Callous periphery of wound   Selective Debridement - Tools Used Forceps   Selective Debridement - Tissue Removed edges, callous    Wound Therapy - Clinical Statement Periphery of wound slightly macerated therefore returned to silverhydrofiber dressing.  Wound is doing well therefore we will decrease frequency to once a week.     Wound Therapy - Functional Problem List dificulty walking, dificulty wearing shoes    Factors Delaying/Impairing Wound Healing Altered sensation;Diabetes Mellitus;Infection - systemic/local;Immobility;Multiple medical problems;Vascular compromise   Hydrotherapy Plan Debridement;Dressing change;Patient/family education   Wound Therapy - Current Recommendations PT   Wound Plan Continue to cleanse and debride with appropriate dressings to Rt  great toe.   Decrease frequency to one time a week.  Charge changed to self care due to limited debridement time.    Dressing  silverhydrofiber, 2x2 and medipore tape drawn tight to prevent hypertrophic wound bed.    Decrease Necrotic Tissue to 20%   Decrease Necrotic Tissue - Progress Met   Increase Granulation Tissue to 80%   Increase Granulation Tissue - Progress Met   Decrease Length/Width/Depth by (cm) 1.0    Decrease Length/Width/Depth - Progress Progressing toward goal   Improve Drainage Characteristics Min   Improve Drainage Characteristics - Progress Met   Patient/Family will be able to  independent in wound changing   Patient/Family Instruction Goal - Progress Discontinued (comment)  due to stroke pt will not be able to care for wound    Additional Wound Therapy Goal full healing of both toe and knee wound    Additional Wound Therapy Goal - Progress Progressing toward goal   Goals/treatment plan/discharge plan were made with and agreed upon by patient/family Yes   Time For Goal Achievement Other (comment)  6 weeks ( toe 6  weeks, knee 3 weeks )   Wound Therapy - Potential for Goals Fair                      PT Long Term Goals - 12/22/15 0941    PT LONG TERM GOAL #1   Title Pt to be able to wear all shoes without Rt great toe irritation    Time 6   Period Weeks   Status New   PT LONG TERM GOAL #2   Title Pt to be able to wear slacks without irritation of Rt knee.   Time 3   Period Weeks   Status New             Patient will benefit from skilled therapeutic intervention in order to improve the following deficits and impairments:     Visit Diagnosis: Difficulty in walking, not elsewhere classified  Unsteadiness on feet  Non-pressure chronic ulcer of other part of right foot with necrosis of muscle [facility])     Problem List Patient Active Problem List   Diagnosis Date Noted  . Rectal bleeding 07/28/2015  . Chronic anticoagulation 07/28/2015  . PVD (peripheral vascular disease) ([place])   . S/P angioplasty 04/23/2015  . Peripheral arterial occlusive disease ([place]) 04/23/2015  . PAD (peripheral artery disease) ([place])   . Critical lower limb ischemia   . Toe ulcer, right ([place])   . Type II or unspecified type diabetes mellitus without mention of complication, uncontrolled 11/27/2013  . Acute respiratory failure with hypoxia ([place]) 11/26/2013  . CAP (community acquired pneumonia) 11/26/2013  . CHF exacerbation ([place]) 11/25/2013  . Cardiomyopathy, ischemic 06/13/2013  . At risk for sudden cardiac death 2013-06-16  . S/P cardiac catheterization, Rt & Lt heart cath 06/05/2013 June 16, 2013  . Acute on chronic combined systolic and diastolic HF (heart failure), NYHA class 3 ([place]) 06/04/2013  . Atrial flutter with rapid ventricular response ([place]) 06/02/2013  . Acute renal failure: Cr up to 1.8 06/02/2013  . Hyperkalemia 06/02/2013    Class: Acute  . LBBB (left bundle branch block) 06/01/2013  . Hypotension- secondary to AF and Diltiazem Rx 06/01/2013  . Obesity (BMI 30-39.9) 06/01/2013   . Acute on chronic diastolic heart failure - due to Afib AVR 06/01/2013  . Atrial fibrillation with RVR- converted with Amiodarone 05/31/2013  . Stroke- Lt brain 2003 04/08/2013  . Hemiplegia affecting right dominant side ([place]) 04/08/2013  . Cataract 04/08/2013  . GERD (gastroesophageal reflux disease) 04/08/2013  . S/P CABG x 3 - 2010 04/08/2013  . S/P AVR-tissue, 2010 04/08/2013  . DM2 (diabetes mellitus, type 2) ([place]) 04/08/2013  . Dyslipidemia 04/08/2013  . SUBACROMIAL BURSITIS, LEFT 11/05/2009    Rayetta Humphrey, PT CLT [phone] 01/07/2016, 11:16 AM  [facility] [address] [place], [name], [address] Phone: [phone]   Fax:  [phone]  Name: [name] MRN: [id number] Date of Birth: Aug 25, 1945

## 2016-01-13 ENCOUNTER — Ambulatory Visit (HOSPITAL_COMMUNITY): Payer: Medicare Other

## 2016-01-13 DIAGNOSIS — L97111 Non-pressure chronic ulcer of right thigh limited to breakdown of skin: Secondary | ICD-10-CM | POA: Diagnosis not present

## 2016-01-13 DIAGNOSIS — L97513 Non-pressure chronic ulcer of other part of right foot with necrosis of muscle: Secondary | ICD-10-CM | POA: Diagnosis not present

## 2016-01-13 DIAGNOSIS — S81801D Unspecified open wound, right lower leg, subsequent encounter: Secondary | ICD-10-CM | POA: Diagnosis not present

## 2016-01-13 DIAGNOSIS — R2681 Unsteadiness on feet: Secondary | ICD-10-CM

## 2016-01-13 DIAGNOSIS — R262 Difficulty in walking, not elsewhere classified: Secondary | ICD-10-CM | POA: Diagnosis not present

## 2016-01-13 NOTE — Therapy (Signed)
[place], [name], [address] Phone: [phone]   Fax:  [phone]  Wound Care Therapy  Patient Details  Name: [name] MRN: [id number] Date of Birth: [birth year] No Data Recorded  Encounter Date: 01/13/2016      PT End of Session - 01/13/16 0833    Visit Number 12   Number of Visits 87   Date for PT Re-Evaluation 02/04/16   Authorization Type [employer] Medicare: Gcode and reassessment complete visit 56   Authorization Time Period 04/22/15 to 06/21/05   Authorization - Visit Number 79   Authorization - Number of Visits 61   PT Start Time 0812   PT Stop Time 0825   PT Time Calculation (min) 13 min   Activity Tolerance Patient tolerated treatment well   Behavior During Therapy [facility] for tasks assessed/performed      Past Medical History  Diagnosis Date  . Hypertension   . Diabetes mellitus     type 2   . History of valve replacement 2010    [facility] Ease bioprosthetic 8mm  . Thyroid disease   . Stroke [facility]) 2003    R-sided weakness & upper extremity swelling   . Coronary artery disease   . Dyslipidemia   . At risk for sudden cardiac death 07/04/2013  . PAF (paroxysmal atrial fibrillation) ([place]) 05/2013    converted with amiodarone to SR  . Chronic anticoagulation 05/2013    started  . S/P cardiac catheterization, Rt & Lt heart cath 06/05/2013 2013-07-04  . S/P CABG x 3 2010  . GERD (gastroesophageal reflux disease)   . Cataract   . Chronic kidney disease     Patient reports he is seeing Kidney doctor in September    Past Surgical History  Procedure Laterality Date  . Coronary artery bypass graft  01/26/2009    LIMA to LAD, SVG to circumflex, SVG to PDA  . Aortic valve replacement  01/26/2009    [facility] Ease bioprosthetic 20mm valve  . Transthoracic echocardiogram  09/2011    grade 1 diastolic dysfunction; increasing valve gradient; calcified MV annulus   . Cardiac catheterization   06/05/2013    native 3 vessel disease; patent LIMA to LAD, SVG to OM and SVG to PDA; Elevated right and left heart filling pressures, with predominantly pulmonary venous hypertension, normal PVR  . Transthoracic echocardiogram  06/03/2013    EF 25-30%, mod conc. hypertrophy, grade 3 diastolic dysfunction; LA mod dilated; calcified MV annulus; transaortic gradients are normal for the bioprosthetic valve; inf vena cava dilated (elevated CVP) - LifeVest  . Left and right heart catheterization with coronary angiogram N/A 06/05/2013    Procedure: LEFT AND RIGHT HEART CATHETERIZATION WITH CORONARY ANGIOGRAM;  Surgeon: Leonie Man, MD;  Location: [facility] CATH LAB;  Service: Cardiovascular;  Laterality: N/A;  . Graft(s) angiogram  06/05/2013    Procedure: GRAFT(S) Cyril Loosen;  Surgeon: Leonie Man, MD;  Location: [facility] CATH LAB;  Service: Cardiovascular;;  . Colonoscopy  2007    Dr. Aviva Signs: internal hemorrhoids    There were no vitals filed for this visit.       Subjective Assessment - 01/13/16 0827    Subjective Pt stated he is doing well today, no reports of pain. Marland Kitchen   Pertinent History Patient reports that this wound started as a small little brown spot on the end of his toe that just kept getting bigger; he thinks it started about 8 months ago.  He reports that it is now slowly getting better but has been healing slow. He states  that it has been getting better.    Currently in Pain? No/denies                   Wound Therapy - 01/13/16 0828    Subjective Pt stated he is doing well today, no reports of pain. .   Patient and Family Stated Goals wound to heal    Pain Assessment No/denies pain   Pain Score 0-No pain   Evaluation and Treatment Procedures Explained to Patient/Family Yes   Evaluation and Treatment Procedures agreed to   Wound Properties Date First Assessed: 06/23/15 Time First Assessed: 0900 Wound Type: Other (Comment) Location: Toe (Comment  which one) Location  Orientation: Right Wound Description (Comments): medial  aspect of Rt great toe Present on Admission: No   Dressing Type Silver hydrofiber   Dressing Changed Changed   Dressing Status Old drainage   Dressing Change Frequency PRN   % Wound base Red or Granulating 100%   % Wound base Yellow 0%   Peri-wound Assessment Erythema (blanchable)   Wound Length (cm) 0.6 cm   Wound Width (cm) 0.8 cm   Wound Depth (cm) 0 cm   Margins Attached edges (approximated)   Drainage Amount Scant   Drainage Description Serous   Treatment Cleansed;Debridement (Selective)   Incision Properties Date First Assessed: 04/23/15 Time First Assessed: 1738 Location: Groin Location Orientation: Left Present on Admission: No   Selective Debridement - Location debridement of epiboled edges of toe.  Callous periphery of wound   Selective Debridement - Tools Used Forceps   Selective Debridement - Tissue Removed edges, callous    Wound Therapy - Clinical Statement Minimal maceration noted around wound today, continued with silverhydrofiber to reduce maceration with decreased frequency of 1x/week.  Minimal debridement necessary this session.  Cleansed, moisterized and applied appropriate dressings.  No reports of pain this session.     Wound Therapy - Functional Problem List dificulty walking, dificulty wearing shoes    Factors Delaying/Impairing Wound Healing Altered sensation;Diabetes Mellitus;Infection - systemic/local;Immobility;Multiple medical problems;Vascular compromise   Hydrotherapy Plan Debridement;Dressing change;Patient/family education   Wound Therapy - Frequency Other (comment)  1x/week   Wound Therapy - Current Recommendations PT   Wound Plan Continue to cleanse and debride with appropriate dressings to Rt  great toe.      Dressing  silverhydrofiber, 2x2 and medipore tape drawn tight to prevent hypertrophic wound bed.               PT Long Term Goals - 12/22/15 0941    PT LONG TERM GOAL #1   Title Pt  to be able to wear all shoes without Rt great toe irritation    Time 6   Period Weeks   Status New   PT LONG TERM GOAL #2   Title Pt to be able to wear slacks without irritation of Rt knee.   Time 3   Period Weeks   Status New             Patient will benefit from skilled therapeutic intervention in order to improve the following deficits and impairments:     Visit Diagnosis: Difficulty in walking, not elsewhere classified  Unsteadiness on feet  Non-pressure chronic ulcer of other part of right foot with necrosis of muscle ([place])     Problem List Patient Active Problem List   Diagnosis Date Noted  . Rectal bleeding 07/28/2015  .  Chronic anticoagulation 07/28/2015  . PVD (peripheral vascular disease) ([place])   . S/P angioplasty 04/23/2015  . Peripheral arterial occlusive disease ([place]) 04/23/2015  . PAD (peripheral artery disease) ([place])   . Critical lower limb ischemia   . Toe ulcer, right ([place])   . Type II or unspecified type diabetes mellitus without mention of complication, uncontrolled 11/27/2013  . Acute respiratory failure with hypoxia ([place]) 11/26/2013  . CAP (community acquired pneumonia) 11/26/2013  . CHF exacerbation ([place]) 11/25/2013  . Cardiomyopathy, ischemic 06/13/2013  . At risk for sudden cardiac death June 18, 2013  . S/P cardiac catheterization, Rt & Lt heart cath 06/05/2013 2013-06-18  . Acute on chronic combined systolic and diastolic HF (heart failure), NYHA class 3 ([place]) 06/04/2013  . Atrial flutter with rapid ventricular response ([place]) 06/02/2013  . Acute renal failure: Cr up to 1.8 06/02/2013  . Hyperkalemia 06/02/2013    Class: Acute  . LBBB (left bundle branch block) 06/01/2013  . Hypotension- secondary to AF and Diltiazem Rx 06/01/2013  . Obesity (BMI 30-39.9) 06/01/2013  . Acute on chronic diastolic heart failure - due to Afib AVR 06/01/2013  . Atrial fibrillation with RVR- converted with Amiodarone 05/31/2013  . Stroke- Lt brain 2003  04/08/2013  . Hemiplegia affecting right dominant side ([place]) 04/08/2013  . Cataract 04/08/2013  . GERD (gastroesophageal reflux disease) 04/08/2013  . S/P CABG x 3 - 2010 04/08/2013  . S/P AVR-tissue, 2010 04/08/2013  . DM2 (diabetes mellitus, type 2) ([place]) 04/08/2013  . Dyslipidemia 04/08/2013  . SUBACROMIAL BURSITIS, LEFT 11/05/2009   Ihor Austin, LPTA; CBIS [phone]  Aldona Lento 01/13/2016, 8:34 AM  [place], [name], [address] Phone: [phone]   Fax:  [phone]  Name: [name] MRN: [id number] Date of Birth: [birth year]

## 2016-01-14 ENCOUNTER — Ambulatory Visit
Admission: RE | Admit: 2016-01-14 | Discharge: 2016-01-14 | Disposition: A | Discharge status: Home / Self Care | Payer: Medicare Other | Source: Ambulatory Visit | Attending: Interventional Radiology | Admitting: Interventional Radiology

## 2016-01-14 ENCOUNTER — Other Ambulatory Visit (HOSPITAL_COMMUNITY): Payer: Self-pay | Admitting: Interventional Radiology

## 2016-01-14 DIAGNOSIS — I739 Peripheral vascular disease, unspecified: Secondary | ICD-10-CM

## 2016-01-14 NOTE — Progress Notes (Signed)
Patient ID: [name], male   DOB: Sep 18, 1945, 70 y.o.   MRN: [id number]   Referring Physician(s): Bradford,W/Mann,Benjamin, [facility]  Chief Complaint: The patient is seen in follow up today s/p right lower extremity arteriogram with revascularization in September 2016 and February 2017  History of present illness: [name] is a 70 year old male with history of diabetes,  hypertension, prior stroke, paroxysmal atrial fibrillation and chronic kidney disease. He also has history of category 5 critical limb ischemia involving the right lower extremity with a slowly healing ulcer on the right great toe. On 04/23/15 he underwent right lower extremity arteriogram with recanalization of the right SFA occlusion and angioplasty/stenting of the right SFA and popliteal artery. On 10/07/15 he underwent atherectomy and drug-eluting balloon angioplasty of the distal SFA/popliteal stenosis as well as atherectomy and angioplasty of a peroneal artery stenosis. There was unsuccessful attempts at recannalization of chronically occluded anterior tibial /posterior tibial arteries on the right. He was last seen by Dr. Laurence Ferrari on 11/05/15 and at that time had significant interval healing in the right great toe ulcer with improved sensation to the toe and bleeding during debridement at wound care center. He presents today for additional follow-up and ABI/right lower extremity arterial duplex study. Since his last visit the patient has done well. He continues to attend wound center once weekly. There has been marked improvement in the size and appearance of the right medial great toe ulcer. He is currently on Plavix and Eliquis therapy. Only complaints at this time include some occasional headache, occasional cough as well as rectal bleeding.  He states that he is attempting to schedule colonoscopy for further evaluation. He currently denies significant right foot pain or paresthesias. He does have what appears to be a left  second hammertoe.   Past Medical History  Diagnosis Date  . Hypertension   . Diabetes mellitus     type 2   . History of valve replacement 2010    [facility] Ease bioprosthetic 16mm  . Thyroid disease   . Stroke [facility]) 2003    R-sided weakness & upper extremity swelling   . Coronary artery disease   . Dyslipidemia   . At risk for sudden cardiac death 06-25-13  . PAF (paroxysmal atrial fibrillation) ([place]) 05/2013    converted with amiodarone to SR  . Chronic anticoagulation 05/2013    started  . S/P cardiac catheterization, Rt & Lt heart cath 06/05/2013 Jun 25, 2013  . S/P CABG x 3 2010  . GERD (gastroesophageal reflux disease)   . Cataract   . Chronic kidney disease     Patient reports he is seeing Kidney doctor in September    Past Surgical History  Procedure Laterality Date  . Coronary artery bypass graft  01/26/2009    LIMA to LAD, SVG to circumflex, SVG to PDA  . Aortic valve replacement  01/26/2009    [facility] Ease bioprosthetic 69mm valve  . Transthoracic echocardiogram  09/2011    grade 1 diastolic dysfunction; increasing valve gradient; calcified MV annulus   . Cardiac catheterization  06/05/2013    native 3 vessel disease; patent LIMA to LAD, SVG to OM and SVG to PDA; Elevated right and left heart filling pressures, with predominantly pulmonary venous hypertension, normal PVR  . Transthoracic echocardiogram  06/03/2013    EF 25-30%, mod conc. hypertrophy, grade 3 diastolic dysfunction; LA mod dilated; calcified MV annulus; transaortic gradients are normal for the bioprosthetic valve; inf vena cava dilated (elevated CVP) - LifeVest  .  Left and right heart catheterization with coronary angiogram N/A 06/05/2013    Procedure: LEFT AND RIGHT HEART CATHETERIZATION WITH CORONARY ANGIOGRAM;  Surgeon: Leonie Man, MD;  Location: [facility] CATH LAB;  Service: Cardiovascular;  Laterality: N/A;  . Graft(s) angiogram  06/05/2013    Procedure: GRAFT(S) Cyril Loosen;  Surgeon:  Leonie Man, MD;  Location: [facility] CATH LAB;  Service: Cardiovascular;;  . Colonoscopy  2007    Dr. Aviva Signs: internal hemorrhoids    Allergies: Review of patient's allergies indicates no known allergies.  Medications: Prior to Admission medications   Medication Sig Start Date End Date Taking? Authorizing Provider  acetaminophen (TYLENOL) 500 MG tablet Take 1,000 mg by mouth 2 (two) times daily as needed for headache. For pain/headaches    Historical Provider, MD  ALPRAZolam Duanne Moron) 1 MG tablet Take 1 mg by mouth 4 (four) times daily as needed for anxiety.     Historical Provider, MD  amiodarone (PACERONE) 200 MG tablet TAKE ONE (1) TABLET EACH DAY 07/15/15   Pixie Casino, MD  atorvastatin (LIPITOR) 20 MG tablet Take 1 tablet (20 mg total) by mouth daily at 6 PM. 10/30/14   Pixie Casino, MD  celecoxib (CELEBREX) 200 MG capsule Take 1 capsule by mouth every evening.  04/07/14   Historical Provider, MD  clopidogrel (PLAVIX) 75 MG tablet Take 1 tablet (75 mg total) by mouth daily. Patient taking differently: Take 75 mg by mouth every evening.  04/25/15   Hedy Jacob, PA-C  docusate sodium (COLACE) 100 MG capsule Take 100 mg by mouth 2 (two) times daily.      Historical Provider, MD  ELIQUIS 5 MG TABS tablet TAKE ONE TABLET TWICE DAILY 11/09/15   Pixie Casino, MD  fenofibrate (TRICOR) 145 MG tablet Take 145 mg by mouth daily.    Historical Provider, MD  ferrous sulfate 325 (65 FE) MG EC tablet Take 325 mg by mouth daily with breakfast.    Historical Provider, MD  gabapentin (NEURONTIN) 300 MG capsule Take 300 mg by mouth 2 (two) times daily.     Historical Provider, MD  HYDROcodone-acetaminophen (NORCO) 10-325 MG per tablet Take 0.5 tablets by mouth 2 (two) times daily.  03/31/14   Historical Provider, MD  hydrocortisone (PROCTOSOL HC) 2.5 % rectal cream Place 1 application rectally 2 (two) times daily. For 10 days 07/28/15   Mahala Menghini, PA-C  insulin glargine (LANTUS) 100  UNIT/ML injection Inject 60-75 Units into the skin 2 (two) times daily. Takes 75 units in the morning and 60 units in the evening    Historical Provider, MD  insulin lispro (HUMALOG) 100 UNIT/ML injection Inject 35-50 Units into the skin 3 (three) times daily before meals.     Historical Provider, MD  levothyroxine (SYNTHROID, LEVOTHROID) 25 MCG tablet Take 25 mcg by mouth daily.      Historical Provider, MD  metoprolol succinate (TOPROL-XL) 25 MG 24 hr tablet TAKE ONE (1) TABLET EACH DAY 10/02/15   Pixie Casino, MD  Omega-3 Fatty Acids (FISH OIL) 1200 MG CAPS Take 1,200 mg by mouth 2 (two) times daily.    Historical Provider, MD  pantoprazole (PROTONIX) 40 MG tablet Take 1 tablet (40 mg total) by mouth 2 (two) times daily. 10/30/14   Pixie Casino, MD  polyethylene glycol-electrolytes (NULYTELY/GOLYTELY) 420 G solution Take 4,000 mLs by mouth once. Patient not taking: Reported on 10/07/2015 08/05/15   Mahala Menghini, PA-C  silver sulfADIAZINE (SILVADENE) 1 % cream Apply  1 application topically 2 (two) times daily.     Historical Provider, MD  torsemide (DEMADEX) 10 MG tablet Take 1 tablet (10 mg total) by mouth daily. 06/29/15   Pixie Casino, MD     Family History  Problem Relation Age of Onset  . Heart attack Mother   . Liver disease Brother   . Diabetes Sister     x4    Social History   Social History  . Marital Status: Divorced    Spouse Name: N/A  . Number of Children: 4  . Years of Education: N/A   Occupational History  . 4    Social History Main Topics  . Smoking status: Former Smoker    Types: Cigarettes    Quit date: 08/15/2001  . Smokeless tobacco: Former Systems developer    Types: Chew     Comment: Quit in 2003  . Alcohol Use: No  . Drug Use: No  . Sexual Activity: Not Currently   Other Topics Concern  . Not on file   Social History Narrative     Vital Signs: BP 140/82 mmHg  Pulse 68  Temp(Src) 97.8 F (36.6 C) (Oral)  Resp 15  Ht 5\' 10"  (1.778 m)  Wt 250  lb (113.399 kg)  BMI 35.87 kg/m2  SpO2 97%  Physical Exam patient awake, alert. Chest with slightly diminished breath sounds right base, left clear. Heart with regular rate and rhythm. Abdomen obese, soft, positive bowel sounds, nontender. Puncture site left common femoral artery soft, clean, dry, nontender, no hematoma. Medial right great toe ulcer markedly improved with diameter approximating size of a pencil eraser; palpable pulses noted in foot with good sensory/motor function. Left second hammertoe.  Imaging: No results found.  Labs:  CBC:  Recent Labs  04/23/15 1000 09/18/15 0656 10/07/15 0758  WBC 7.0 6.4 5.2  HGB 13.8 14.7 13.5  HCT 41.5 41.6 39.8  PLT 160 159 135*    COAGS:  Recent Labs  04/23/15 1000 09/18/15 0656 10/07/15 0758  INR 1.07 1.09 1.02  APTT  --  31 29    BMP:  Recent Labs  03/18/15 0831 04/23/15 1000 06/29/15 1010 09/18/15 0656 10/07/15 0758  NA  --  138 141 139 138  K  --  4.7 4.3 4.7 4.4  CL  --  103 100 102 101  CO2  --  26 28 26 23   GLUCOSE  --  277* 192* 349* 340*  BUN 22 21* 23 16 19   CALCIUM  --  9.6 9.7 9.4 9.2  CREATININE 1.35* 1.35* 1.38* 1.55* 1.43*  GFRNONAA 54* 53*  --  45* 49*  GFRAA 63 >60  --  52* 57*    LIVER FUNCTION TESTS:  Recent Labs  06/29/15 1010  BILITOT 0.4  AST 32  ALT 50*  ALKPHOS 50  PROT 6.6  ALBUMIN 4.2    Assessment: Patient with history of diabetes, coronary artery disease, hypertension,PAF and chronic kidney disease with prior history of category 5 ischemia of the right lower extremity and slowly healing right medial great toe ulcer; status post recanalization of a right SFA occlusion in September 2016 with additional angioplasty and stenting of the right popliteal and SFA ; status post atherectomy and drug-eluting balloon angioplasty of distal SFA/popliteal stenosis and atherectomy/angioplasty of peroneal artery stenosis in February 2017; much improved appearance of right medial great toe  ulcer since above procedure ; patient also noted to have recent rectal bleeding- colonoscopy scheduling pending; he was seen by  Dr. Laurence Ferrari today and advised to stop Plavix and continue Eliquis at this time. Please see Dr. Katrinka Blazing follow-up note for results of ABI and right lower extremity arterial duplex study performed today.   Signed: D. Rowe Robert 01/14/2016, 3:15 PM   Please refer to Dr. Katrinka Blazing attestation of this note for management and plan.

## 2016-01-15 ENCOUNTER — Encounter (HOSPITAL_COMMUNITY): Payer: Medicare Other

## 2016-01-18 ENCOUNTER — Encounter (HOSPITAL_COMMUNITY): Payer: Medicare Other | Admitting: Physical Therapy

## 2016-01-20 ENCOUNTER — Ambulatory Visit (HOSPITAL_COMMUNITY): Payer: Medicare Other | Attending: Orthopaedic Surgery | Admitting: Physical Therapy

## 2016-01-20 DIAGNOSIS — L97111 Non-pressure chronic ulcer of right thigh limited to breakdown of skin: Secondary | ICD-10-CM | POA: Diagnosis not present

## 2016-01-20 DIAGNOSIS — R262 Difficulty in walking, not elsewhere classified: Secondary | ICD-10-CM | POA: Diagnosis not present

## 2016-01-20 DIAGNOSIS — L97513 Non-pressure chronic ulcer of other part of right foot with necrosis of muscle: Secondary | ICD-10-CM | POA: Diagnosis not present

## 2016-01-20 DIAGNOSIS — R2681 Unsteadiness on feet: Secondary | ICD-10-CM | POA: Insufficient documentation

## 2016-01-20 NOTE — Therapy (Signed)
[place], [name], [address] Phone: [phone]   Fax:  [phone]  Wound Care Therapy  Patient Details  Name: [name] MRN: [id number] Date of Birth: 27-Jul-1946 [name] Data Recorded  Encounter Date: 01/20/2016      PT End of Session - 01/20/16 0912    Visit Number 25   Number of Visits 49   Date for PT Re-Evaluation 02/04/16   Authorization Type UHC Medicare: Gcode and reassessment complete visit 81   Authorization Time Period 04/22/15 to 06/21/05   Authorization - Visit Number 23   Authorization - Number of Visits 73   PT Start Time 0815   PT Stop Time 6073   PT Time Calculation (min) 40 min   Activity Tolerance Patient tolerated treatment well   Behavior During Therapy [facility] for tasks assessed/performed      Past Medical History  Diagnosis Date  . Hypertension   . Diabetes mellitus     type 2   . History of valve replacement 2010    [facility] Ease bioprosthetic 67m  . Thyroid disease   . Stroke ([facility] 2003    R-sided weakness & upper extremity swelling   . Coronary artery disease   . Dyslipidemia   . At risk for sudden cardiac death 111-16-2014 . PAF (paroxysmal atrial fibrillation) ([place] 05/2013    converted with amiodarone to SR  . Chronic anticoagulation 05/2013    started  . S/P cardiac catheterization, Rt & Lt heart cath 06/05/2013 111/16/14 . S/P CABG x 3 2010  . GERD (gastroesophageal reflux disease)   . Cataract   . Chronic kidney disease     Patient reports he is seeing Kidney doctor in September    Past Surgical History  Procedure Laterality Date  . Coronary artery bypass graft  01/26/2009    LIMA to LAD, SVG to circumflex, SVG to PDA  . [facility]  [facility]  . Transthoracic echocardiogram  09/2011    grade 1 diastolic dysfunction; increasing valve gradient; calcified MV annulus   . Cardiac catheterization   06/05/2013    native 3 vessel disease; patent LIMA to LAD, SVG to OM and SVG to PDA; Elevated right and left heart filling pressures, with predominantly pulmonary venous hypertension, normal PVR  . Transthoracic echocardiogram  06/03/2013    EF 25-30%, mod conc. hypertrophy, grade 3 diastolic dysfunction; LA mod dilated; calcified MV annulus; transaortic gradients are normal for the bioprosthetic valve; inf vena cava dilated (elevated CVP) - LifeVest  . Left and right heart catheterization with coronary angiogram N/A 06/05/2013    Procedure: LEFT AND RIGHT HEART CATHETERIZATION WITH CORONARY ANGIOGRAM;  Surgeon: DaLeonie ManMD;  [facility];  Service: Cardiovascular;  Laterality: N/A;  . Graft(s) angiogram  06/05/2013    Procedure: GRAFT(S) ANCyril Loosen Surgeon: DaLeonie ManMD;  [facility];  Service: Cardiovascular;;  . Colonoscopy  2007    Dr. MaAviva Signsinternal hemorrhoids    There were [name] vitals filed for this visit.                  Wound Therapy - 01/20/16 0904    Subjective Pt states they did not wrap it back up well at the doctors office and it is now bleeding.    Patient and Family Stated Goals wound to heal    Pain Assessment [name]/denies  pain   Pain Score 0-[name] pain   Wound Properties Date First Assessed: 06/23/15 Time First Assessed: 0900 Wound Type: Other (Comment) Location: Toe (Comment  which one) Location Orientation: Right Wound Description (Comments): medial  aspect of Rt great toe Present on Admission: [name]   Dressing Type Silver hydrofiber  gauze only today from the MD office, [name] contact dressing    Dressing Changed Changed   Dressing Status Old drainage   Dressing Change Frequency PRN   % Wound base Red or Granulating 100%   % Wound base Yellow 0%   Wound Length (cm) 0.4 cm   Wound Width (cm) 0.5 cm   Wound Depth (cm) 0 cm   Margins Attached edges (approximated)   Drainage Amount Scant   Drainage Description Serosanguineous    Treatment Cleansed;Debridement (Selective)   Incision Properties Date First Assessed: 04/23/15 Time First Assessed: 1738 Location: Groin Location Orientation: Left Present on Admission: [name]   Selective Debridement - Location debridement of epiboled edges of toe.  Callous periphery of wound   Selective Debridement - Tools Used Forceps   Selective Debridement - Tissue Removed edges, callous    Wound Therapy - Clinical Statement [name] maceration present today.  Removed alot of dry skin from perimeter of wound.  Moisturized well prior to rebandaging.  Wound is approximating well now with anticipation of full healing in the next couple visits.     Wound Plan Continue to cleanse and debride with appropriate dressings to Rt  great toe.      Dressing  silverhydrofiber, 2x2 and medipore tape drawn tight to prevent hypertrophic wound bed.    Decrease Necrotic Tissue to 20%   Decrease Necrotic Tissue - Progress Met   Increase Granulation Tissue to 80%   Increase Granulation Tissue - Progress Met   Decrease Length/Width/Depth by (cm) 1.0    Decrease Length/Width/Depth - Progress Met   Improve Drainage Characteristics Min   Improve Drainage Characteristics - Progress Met   Patient/Family will be able to  independent in wound changing   Patient/Family Instruction Goal - Progress Discontinued (comment)   Additional Wound Therapy Goal full healing of both toe and knee wound    Additional Wound Therapy Goal - Progress Progressing toward goal                      PT Long Term Goals - 01/20/16 0915    PT LONG TERM GOAL #1   Title Pt to be able to wear all shoes without Rt great toe irritation    Time 6   Period Weeks   Status Achieved   PT LONG TERM GOAL #2   Title Pt to be able to wear slacks without irritation of Rt knee.   Time 3   Period Weeks   Status Achieved             Patient will benefit from skilled therapeutic intervention in order to improve the following deficits  and impairments:     Visit Diagnosis: Difficulty in walking, not elsewhere classified  Unsteadiness on feet  Non-pressure chronic ulcer of other part of right foot with necrosis of muscle ([place])     Problem List Patient Active Problem List   Diagnosis Date Noted  . Rectal bleeding 07/28/2015  . Chronic anticoagulation 07/28/2015  . PVD (peripheral vascular disease) ([place])   . S/P angioplasty 04/23/2015  . Peripheral arterial occlusive disease ([place]) 04/23/2015  . PAD (peripheral artery disease) ([place])   . Critical  lower limb ischemia   . Toe ulcer, right ([place])   . Type II or unspecified type diabetes mellitus without mention of complication, uncontrolled 11/27/2013  . Acute respiratory failure with hypoxia ([place]) 11/26/2013  . CAP (community acquired pneumonia) 11/26/2013  . CHF exacerbation ([place]) 11/25/2013  . Cardiomyopathy, ischemic 06/13/2013  . At risk for sudden cardiac death 05-Jul-2013  . S/P cardiac catheterization, Rt & Lt heart cath 06/05/2013 2013-07-05  . Acute on chronic combined systolic and diastolic HF (heart failure), NYHA class 3 ([place]) 06/04/2013  . Atrial flutter with rapid ventricular response ([place]) 06/02/2013  . Acute renal failure: Cr up to 1.8 06/02/2013  . Hyperkalemia 06/02/2013    Class: Acute  . LBBB (left bundle branch block) 06/01/2013  . Hypotension- secondary to AF and Diltiazem Rx 06/01/2013  . Obesity (BMI 30-39.9) 06/01/2013  . Acute on chronic diastolic heart failure - due to Afib AVR 06/01/2013  . Atrial fibrillation with RVR- converted with Amiodarone 05/31/2013  . Stroke- Lt brain 2003 04/08/2013  . Hemiplegia affecting right dominant side ([place]) 04/08/2013  . Cataract 04/08/2013  . GERD (gastroesophageal reflux disease) 04/08/2013  . S/P CABG x 3 - 2010 04/08/2013  . S/P AVR-tissue, 2010 04/08/2013  . DM2 (diabetes mellitus, type 2) ([place]) 04/08/2013  . Dyslipidemia 04/08/2013  . SUBACROMIAL BURSITIS, LEFT 11/05/2009    Teena Irani,  PTA/CLT [phone]  01/20/2016, 9:15 AM  [place], [name], [address] Phone: [phone]   Fax:  [phone]  Name: JACQUEZ SHEETZ MRN: [id number] Date of Birth: 1945-11-10

## 2016-01-25 ENCOUNTER — Encounter (HOSPITAL_COMMUNITY): Payer: Medicare Other | Admitting: Physical Therapy

## 2016-01-25 ENCOUNTER — Other Ambulatory Visit: Payer: Self-pay

## 2016-01-25 NOTE — Patient Outreach (Signed)
[place] [facility]) Care Management  01/25/2016  [name] Dec 21, 1945 [id number]   Telephone call to patient for monthly call. Male answers stating patient not in.  HIPAA compliant message left.    Plan: RN Health Coach will attempt patient within 2 weeks.    [name], RN, MSN [place] [phone]

## 2016-01-28 ENCOUNTER — Ambulatory Visit (HOSPITAL_COMMUNITY): Payer: Medicare Other | Admitting: Physical Therapy

## 2016-01-28 DIAGNOSIS — R262 Difficulty in walking, not elsewhere classified: Secondary | ICD-10-CM

## 2016-01-28 DIAGNOSIS — R2681 Unsteadiness on feet: Secondary | ICD-10-CM | POA: Diagnosis not present

## 2016-01-28 DIAGNOSIS — L97513 Non-pressure chronic ulcer of other part of right foot with necrosis of muscle: Secondary | ICD-10-CM | POA: Diagnosis not present

## 2016-01-28 DIAGNOSIS — L97111 Non-pressure chronic ulcer of right thigh limited to breakdown of skin: Secondary | ICD-10-CM | POA: Diagnosis not present

## 2016-01-28 NOTE — Therapy (Signed)
[place], [name], [address] Phone: [phone]   Fax:  [phone]  Wound Care Therapy  Patient Details  Name: [name] MRN: [id number] Date of Birth: 1946-01-29 No Data Recorded  Encounter Date: 01/28/2016      PT End of Session - 01/28/16 1031    Visit Number 69   Number of Visits 56   Date for PT Re-Evaluation 02/04/16   Authorization Type UHC Medicare: Gcode and reassessment complete visit 5   Authorization Time Period 04/22/15 to 06/21/05   Authorization - Visit Number 55   Authorization - Number of Visits 80   PT Start Time 0906   PT Stop Time 0944   PT Time Calculation (min) 38 min   Activity Tolerance Patient tolerated treatment well   Behavior During Therapy [facility] for tasks assessed/performed      Past Medical History  Diagnosis Date  . Hypertension   . Diabetes mellitus     type 2   . History of valve replacement 2010    [facility] Ease bioprosthetic 78mm  . Thyroid disease   . Stroke [facility]) 2003    R-sided weakness & upper extremity swelling   . Coronary artery disease   . Dyslipidemia   . At risk for sudden cardiac death 06/08/2013  . PAF (paroxysmal atrial fibrillation) ([place]) 05/2013    converted with amiodarone to SR  . Chronic anticoagulation 05/2013    started  . S/P cardiac catheterization, Rt & Lt heart cath 06/05/2013 06-08-2013  . S/P CABG x 3 2010  . GERD (gastroesophageal reflux disease)   . Cataract   . Chronic kidney disease     Patient reports he is seeing Kidney doctor in September    Past Surgical History  Procedure Laterality Date  . Coronary artery bypass graft  01/26/2009    LIMA to LAD, SVG to circumflex, SVG to PDA  . Aortic valve replacement  01/26/2009    [facility] Ease bioprosthetic 64mm valve  . Transthoracic echocardiogram  09/2011    grade 1 diastolic dysfunction; increasing valve gradient; calcified MV annulus   . Cardiac catheterization   06/05/2013    native 3 vessel disease; patent LIMA to LAD, SVG to OM and SVG to PDA; Elevated right and left heart filling pressures, with predominantly pulmonary venous hypertension, normal PVR  . Transthoracic echocardiogram  06/03/2013    EF 25-30%, mod conc. hypertrophy, grade 3 diastolic dysfunction; LA mod dilated; calcified MV annulus; transaortic gradients are normal for the bioprosthetic valve; inf vena cava dilated (elevated CVP) - LifeVest  . Left and right heart catheterization with coronary angiogram N/A 06/05/2013    Procedure: LEFT AND RIGHT HEART CATHETERIZATION WITH CORONARY ANGIOGRAM;  Surgeon: Leonie Man, MD;  [facility] LAB;  Service: Cardiovascular;  Laterality: N/A;  . Graft(s) angiogram  06/05/2013    Procedure: GRAFT(S) Cyril Loosen;  Surgeon: Leonie Man, MD;  Location: [facility] C[facility] LAB;  Service: Cardiovascular;;  . Colonoscopy  2007    Dr. Aviva Signs: internal hemorrhoids    There were no vitals filed for this visit.       Subjective Assessment - 01/28/16 1028    Subjective No reports of pain, doing great.                   Wound Therapy - 01/28/16 1028    Subjective no pain or difficulties   Patient and Family Stated Goals wound to heal  Pain Assessment No/denies pain   Wound Properties Date First Assessed: 06/23/15 Time First Assessed: 0900 Wound Type: Other (Comment) Location: Toe (Comment  which one) Location Orientation: Right Wound Description (Comments): medial  aspect of Rt great toe Present on Admission: No   Dressing Type Impregnated gauze (bismuth)  2X2 and medipore   Dressing Changed Changed   Dressing Status Old drainage   Dressing Change Frequency PRN   % Wound base Red or Granulating 100%   % Wound base Yellow 0%   Wound Length (cm) 0.1 cm   Wound Width (cm) 0.1 cm   Margins Attached edges (approximated)   Drainage Amount None   Treatment Cleansed;Debridement (Selective)   Incision Properties Date First Assessed:  04/23/15 Time First Assessed: 1738 Location: Groin Location Orientation: Left Present on Admission: No   Selective Debridement - Location perimeter of wound   Selective Debridement - Tools Used Forceps;Scissors   Selective Debridement - Tissue Removed dry skin, callous   Wound Therapy - Clinical Statement Wound almost healed with pin point area remaining.  No longer draining with overall good appearance.  REmoved alot of dry, dead skin and callous from perimeter to ensure full healing in all areas.  Anticipate complete healing next week and will be ready for discharge at this point.  Changed dressing to xeroform to help moisten surrounding skin.    Wound Plan Continue to cleanse and debride with appropriate dressings to Rt  great toe.      Dressing  xeroform, 2x2 and medipore tape drawn tight to prevent hypertrophic wound bed.    Decrease Necrotic Tissue to 20%   Increase Granulation Tissue to 80%   Decrease Length/Width/Depth by (cm) 1.0    Improve Drainage Characteristics Min   Patient/Family will be able to  independent in wound changing   Additional Wound Therapy Goal full healing of both toe and knee wound                       PT Long Term Goals - 01/20/16 0915    PT LONG TERM GOAL #1   Title Pt to be able to wear all shoes without Rt great toe irritation    Time 6   Period Weeks   Status Achieved   PT LONG TERM GOAL #2   Title Pt to be able to wear slacks without irritation of Rt knee.   Time 3   Period Weeks   Status Achieved             Patient will benefit from skilled therapeutic intervention in order to improve the following deficits and impairments:     Visit Diagnosis: Difficulty in walking, not elsewhere classified  Unsteadiness on feet  Non-pressure chronic ulcer of other part of right foot with necrosis of muscle ([place])     Problem List Patient Active Problem List   Diagnosis Date Noted  . Rectal bleeding 07/28/2015  . Chronic  anticoagulation 07/28/2015  . PVD (peripheral vascular disease) ([place])   . S/P angioplasty 04/23/2015  . Peripheral arterial occlusive disease ([place]) 04/23/2015  . PAD (peripheral artery disease) ([place])   . Critical lower limb ischemia   . Toe ulcer, right ([place])   . Type II or unspecified type diabetes mellitus without mention of complication, uncontrolled 11/27/2013  . Acute respiratory failure with hypoxia ([place]) 11/26/2013  . CAP (community acquired pneumonia) 11/26/2013  . CHF exacerbation ([place]) 11/25/2013  . Cardiomyopathy, ischemic 06/13/2013  . At risk for sudden  cardiac death 06/07/2013  . S/P cardiac catheterization, Rt & Lt heart cath 06/05/2013 06/07/2013  . Acute on chronic combined systolic and diastolic HF (heart failure), NYHA class 3 ([place]) 06/04/2013  . Atrial flutter with rapid ventricular response ([place]) 06/02/2013  . Acute renal failure: Cr up to 1.8 06/02/2013  . Hyperkalemia 06/02/2013    Class: Acute  . LBBB (left bundle branch block) 06/01/2013  . Hypotension- secondary to AF and Diltiazem Rx 06/01/2013  . Obesity (BMI 30-39.9) 06/01/2013  . Acute on chronic diastolic heart failure - due to Afib AVR 06/01/2013  . Atrial fibrillation with RVR- converted with Amiodarone 05/31/2013  . Stroke- Lt brain 2003 04/08/2013  . Hemiplegia affecting right dominant side ([place]) 04/08/2013  . Cataract 04/08/2013  . GERD (gastroesophageal reflux disease) 04/08/2013  . S/P CABG x 3 - 2010 04/08/2013  . S/P AVR-tissue, 2010 04/08/2013  . DM2 (diabetes mellitus, type 2) ([place]) 04/08/2013  . Dyslipidemia 04/08/2013  . SUBACROMIAL BURSITIS, LEFT 11/05/2009    Teena Irani, PTA/CLT [phone]  01/28/2016, 10:33 AM  [place] [address] [place], [name], [address] Phone: [phone]   Fax:  [phone]  Name: [name] MRN: [id number] Date of Birth: Feb 07, 1946

## 2016-02-01 ENCOUNTER — Encounter (HOSPITAL_COMMUNITY): Payer: Medicare Other | Admitting: Physical Therapy

## 2016-02-01 ENCOUNTER — Other Ambulatory Visit: Payer: Self-pay

## 2016-02-01 DIAGNOSIS — Z1389 Encounter for screening for other disorder: Secondary | ICD-10-CM | POA: Diagnosis not present

## 2016-02-01 DIAGNOSIS — G894 Chronic pain syndrome: Secondary | ICD-10-CM | POA: Diagnosis not present

## 2016-02-01 DIAGNOSIS — E1165 Type 2 diabetes mellitus with hyperglycemia: Secondary | ICD-10-CM | POA: Diagnosis not present

## 2016-02-01 NOTE — Patient Outreach (Signed)
[facility]) Care Management  02/01/2016  [name] Mar 08, 1946 [id number]   Telephone call to patient for monthly call. Male states patient not in.  Health coach contact information given for return call.   Plan: RN Health Coach will attempt patient within 2 weeks.    [name], RN, MSN [place] [phone]

## 2016-02-03 ENCOUNTER — Other Ambulatory Visit: Payer: Self-pay

## 2016-02-03 NOTE — Patient Outreach (Signed)
[facility]) Care Management  02/03/2016  [name] February 09, 1946 [id number]  Telephone call to patient for monthly call.  Male answered stating patient not in. Health Coach contact information given.    Plan: RN Health Coach will send letter to attempt outreach.  If not response after 10 business days will proceed with case closure.   [name], RN, MSN [place] [phone]

## 2016-02-04 ENCOUNTER — Ambulatory Visit (HOSPITAL_COMMUNITY): Payer: Medicare Other | Admitting: Physical Therapy

## 2016-02-04 DIAGNOSIS — R262 Difficulty in walking, not elsewhere classified: Secondary | ICD-10-CM

## 2016-02-04 DIAGNOSIS — L97111 Non-pressure chronic ulcer of right thigh limited to breakdown of skin: Secondary | ICD-10-CM | POA: Diagnosis not present

## 2016-02-04 DIAGNOSIS — L97513 Non-pressure chronic ulcer of other part of right foot with necrosis of muscle: Secondary | ICD-10-CM | POA: Diagnosis not present

## 2016-02-04 DIAGNOSIS — R2681 Unsteadiness on feet: Secondary | ICD-10-CM | POA: Diagnosis not present

## 2016-02-04 NOTE — Therapy (Signed)
[facility] [place], [name], [address] Phone: [phone]   Fax:  [phone]  Wound Care Therapy  Patient Details  Name: [name] MRN: [id number] Date of Birth: 08-21-1945 No Data Recorded  Encounter Date: 02/04/2016      PT End of Session - 02/04/16 0949    Visit Number 30   Number of Visits 52   Date for PT Re-Evaluation 02/04/16   Authorization Type [employer] Medicare: Gcode and reassessment complete visit 73   Authorization Time Period 04/22/15 to 06/21/05   Authorization - Visit Number 28   Authorization - Number of Visits 80   PT Start Time 0900   PT Stop Time 0915   PT Time Calculation (min) 15 min   Activity Tolerance Patient tolerated treatment well   Behavior During Therapy [facility] for tasks assessed/performed      Past Medical History  Diagnosis Date  . Hypertension   . Diabetes mellitus     type 2   . History of valve replacement 2010    [facility] Ease bioprosthetic 52m  . Thyroid disease   . Stroke ([facility] 2003    R-sided weakness & upper extremity swelling   . Coronary artery disease   . Dyslipidemia   . At risk for sudden cardiac death 111-22-14 . PAF ([place]oxysmal [place]    converted with amiodarone to SR  . Chronic anticoagulation 05/2013    started  . S/P cardiac catheterization, Rt & Lt heart cath 06/05/2013 111-22-2014 . S/P CABG x 3 2010  . GERD (gastroesophageal reflux disease)   . Cataract   . Chronic kidney disease     Patient reports he is seeing Kidney doctor in September    Past Surgical History  Procedure Laterality Date  . Coronary artery bypass graft  01/26/2009    LIMA to LAD, SVG to circumflex, SVG to PDA  . [facility]  [facility]  . Transthoracic echocardiogram  09/2011    grade 1 diastolic dysfunction; increasing valve gradient; calcified MV annulus   . Cardiac catheterization   06/05/2013    native 3 vessel disease; patent LIMA to LAD, SVG to OM and SVG to PDA; Elevated right and left heart filling pressures, with predominantly pulmonary venous hypertension, normal PVR  . Transthoracic echocardiogram  06/03/2013    EF 25-30%, mod conc. hypertrophy, grade 3 diastolic dysfunction; LA mod dilated; calcified MV annulus; transaortic gradients are normal for the bioprosthetic valve; inf vena cava dilated (elevated CVP) - LifeVest  . Left and right heart catheterization with coronary angiogram N/A 06/05/2013    Procedure: LEFT AND RIGHT HEART CATHETERIZATION WITH CORONARY ANGIOGRAM;  Surgeon: DaLeonie ManMD;  [facility];  Service: Cardiovascular;  Laterality: N/A;  . Graft(s) angiogram  06/05/2013    Procedure: GRAFT(S) ANCyril Loosen Surgeon: DaLeonie ManMD;  [facility];  Service: Cardiovascular;;  . Colonoscopy  2007    Dr. MaAviva Signsinternal hemorrhoids    There were no vitals filed for this visit.                  Wound Therapy - 02/04/16 0946    Subjective no pain or difficulties   Patient and Family Stated Goals wound to heal    Pain Assessment No/denies pain   Wound Properties Date First Assessed: 06/23/15 Time First Assessed: 0900 Wound Type: Other (  Comment) Location: Toe (Comment  which one) Location Orientation: Right Wound Description (Comments): medial  aspect of Rt great toe Present on Admission: No   Dressing Type Impregnated gauze (bismuth)  2X2 and medipore   Dressing Changed Other (Comment)   Dressing Status Old drainage   Dressing Change Frequency PRN   % Wound base Red or Granulating 100%   % Wound base Yellow 0%   Wound Length (cm) 0 cm   Wound Width (cm) 0 cm   Wound Depth (cm) 0 cm   Margins Attached edges (approximated)   Drainage Amount None   Treatment Cleansed   Incision Properties Date First Assessed: 04/23/15 Time First Assessed: 1738 Location: Groin Location Orientation: Left Present on  Admission: No   Wound Therapy - Clinical Statement Wound is now completely healed.  Pt educated on self care and advised to get regular check ups at podiatrist.  pt verbalized understanding.    Wound Plan discharge as wound healed and no longer needs skilled services   Dressing  none   Decrease Necrotic Tissue to 20%   Increase Granulation Tissue to 80%   Decrease Length/Width/Depth by (cm) 1.0    Improve Drainage Characteristics Min   Patient/Family will be able to  independent in wound changing   Additional Wound Therapy Goal full healing of both toe and knee wound                  PT Education - Feb 10, 2016 0948    Education provided Yes   Education Details regular visits to podiatrist to inspect feet and clip nails   Person(s) Educated Patient   Methods Explanation   Comprehension Verbalized understanding             PT Long Term Goals - 02-10-16 0948    PT LONG TERM GOAL #1   Title Pt to be able to wear all shoes without Rt great toe irritation    Time 6   Period Weeks   Status Achieved   PT LONG TERM GOAL #2   Title Pt to be able to wear slacks without irritation of Rt knee.   Time 3   Period Weeks   Status Achieved             Patient will benefit from skilled therapeutic intervention in order to improve the following deficits and impairments:     Visit Diagnosis: Difficulty in walking, not elsewhere classified  Unsteadiness on feet  Non-pressure chronic ulcer of other part of right foot with necrosis of muscle (HCC)  Non-pressure chronic ulcer of right thigh, limited to breakdown of skin ([place])      G-Codes - Feb 10, 2016 0855    Functional Limitation Other PT primary   Other PT Primary Goal Status (Y7829) At least 1 percent but less than 20 percent impaired, limited or restricted   Other PT Primary Discharge Status (F6213) At least 1 percent but less than 20 percent impaired, limited or restricted       Problem List Patient Active  Problem List   Diagnosis Date Noted  . Rectal bleeding 07/28/2015  . Chronic anticoagulation 07/28/2015  . PVD (peripheral vascular disease) ([place])   . S/P angioplasty 04/23/2015  . Peripheral arterial occlusive disease ([place]) 04/23/2015  . PAD (peripheral artery disease) ([place])   . Critical lower limb ischemia   . Toe ulcer, right ([place])   . Type II or unspecified type diabetes mellitus without mention of complication, uncontrolled 11/27/2013  . Acute respiratory failure  with hypoxia ([place]) 11/26/2013  . CAP (community acquired pneumonia) 11/26/2013  . CHF exacerbation ([place]) 11/25/2013  . Cardiomyopathy, ischemic 06/13/2013  . At risk for sudden cardiac death 2013-06-26  . S/P cardiac catheterization, Rt & Lt heart cath 06/05/2013 2013/06/26  . Acute on chronic combined systolic and diastolic HF (heart failure), NYHA class 3 ([place]) 06/04/2013  . Atrial flutter with rapid ventricular response ([place]) 06/02/2013  . Acute renal failure: Cr up to 1.8 06/02/2013  . Hyperkalemia 06/02/2013    Class: Acute  . LBBB (left bundle branch block) 06/01/2013  . Hypotension- secondary to AF and Diltiazem Rx 06/01/2013  . Obesity (BMI 30-39.9) 06/01/2013  . Acute on chronic diastolic heart failure - due to Afib AVR 06/01/2013  . Atrial fibrillation with RVR- converted with Amiodarone 05/31/2013  . Stroke- Lt brain 2003 04/08/2013  . Hemiplegia affecting right dominant side ([place]) 04/08/2013  . Cataract 04/08/2013  . GERD (gastroesophageal reflux disease) 04/08/2013  . S/P CABG x 3 - 2010 04/08/2013  . S/P AVR-tissue, 2010 04/08/2013  . DM2 (diabetes mellitus, type 2) ([place]) 04/08/2013  . Dyslipidemia 04/08/2013  . SUBACROMIAL BURSITIS, LEFT 11/05/2009    Teena Irani, PTA/CLT [phone]  02/05/2016, 8:55 AM  [facility] [address] [place], [name], [address] Phone: [phone]   Fax:  [phone]  Name: [name] MRN: [id number] Date of  Birth: 1945/10/30   PHYSICAL THERAPY DISCHARGE SUMMARY  Visits from Start of Care: 61  Current functional level related to goals / functional outcomes: Wound is healed    Remaining deficits: none   Education / Equipment: To contact MD right away if any signs of another wound   Plan: Patient agrees to discharge.  Patient goals were met. Patient is being discharged due to meeting the stated rehab goals.  ?????       Rayetta Humphrey, [place] CLT [phone]

## 2016-02-08 ENCOUNTER — Encounter (HOSPITAL_COMMUNITY): Payer: Medicare Other | Admitting: Physical Therapy

## 2016-02-11 ENCOUNTER — Other Ambulatory Visit: Payer: Self-pay | Admitting: Internal Medicine

## 2016-02-11 ENCOUNTER — Ambulatory Visit (HOSPITAL_COMMUNITY): Payer: Medicare Other | Admitting: Physical Therapy

## 2016-02-11 NOTE — Telephone Encounter (Signed)
Rx(s) sent to pharmacy electronically.  

## 2016-02-15 ENCOUNTER — Encounter (HOSPITAL_COMMUNITY): Payer: Medicare Other | Admitting: Physical Therapy

## 2016-02-18 ENCOUNTER — Encounter (HOSPITAL_COMMUNITY): Payer: Medicare Other | Admitting: Physical Therapy

## 2016-02-19 ENCOUNTER — Other Ambulatory Visit: Payer: Self-pay

## 2016-02-19 NOTE — Patient Outreach (Signed)
[facility]) Care Management  02/19/2016  AYOBAMI VELIE 04-28-1946 [id number]  Patient has not responded to calls or letter.  Will proceed with case closure.  Plan: RN Health Coach will send patient and physician closure letter. RN Health Coach will send care management assistant notification of case closure.  [name], RN, MSN [place] [phone]

## 2016-02-22 DIAGNOSIS — S90416A Abrasion, unspecified lesser toe(s), initial encounter: Secondary | ICD-10-CM | POA: Diagnosis not present

## 2016-02-22 DIAGNOSIS — Z23 Encounter for immunization: Secondary | ICD-10-CM | POA: Diagnosis not present

## 2016-02-22 DIAGNOSIS — E782 Mixed hyperlipidemia: Secondary | ICD-10-CM | POA: Diagnosis not present

## 2016-02-22 DIAGNOSIS — I10 Essential (primary) hypertension: Secondary | ICD-10-CM | POA: Diagnosis not present

## 2016-02-22 DIAGNOSIS — Z713 Dietary counseling and surveillance: Secondary | ICD-10-CM | POA: Diagnosis not present

## 2016-02-22 DIAGNOSIS — E1165 Type 2 diabetes mellitus with hyperglycemia: Secondary | ICD-10-CM | POA: Diagnosis not present

## 2016-02-23 ENCOUNTER — Observation Stay (HOSPITAL_COMMUNITY): Payer: Medicare Other

## 2016-02-23 ENCOUNTER — Emergency Department (HOSPITAL_COMMUNITY): Payer: Medicare Other

## 2016-02-23 ENCOUNTER — Ambulatory Visit (HOSPITAL_COMMUNITY): Payer: Medicare Other | Attending: Orthopaedic Surgery | Admitting: Physical Therapy

## 2016-02-23 ENCOUNTER — Telehealth (HOSPITAL_COMMUNITY): Payer: Self-pay | Admitting: Physical Therapy

## 2016-02-23 ENCOUNTER — Observation Stay (HOSPITAL_COMMUNITY)
Admission: EM | Admit: 2016-02-23 | Discharge: 2016-02-25 | Disposition: A | Discharge status: Home Health | Payer: Medicare Other | Attending: Internal Medicine | Admitting: Internal Medicine

## 2016-02-23 ENCOUNTER — Encounter (HOSPITAL_COMMUNITY): Payer: Self-pay | Admitting: Emergency Medicine

## 2016-02-23 DIAGNOSIS — R262 Difficulty in walking, not elsewhere classified: Secondary | ICD-10-CM | POA: Diagnosis not present

## 2016-02-23 DIAGNOSIS — Z87891 Personal history of nicotine dependence: Secondary | ICD-10-CM | POA: Diagnosis not present

## 2016-02-23 DIAGNOSIS — E1122 Type 2 diabetes mellitus with diabetic chronic kidney disease: Secondary | ICD-10-CM | POA: Diagnosis not present

## 2016-02-23 DIAGNOSIS — S91301A Unspecified open wound, right foot, initial encounter: Secondary | ICD-10-CM | POA: Diagnosis present

## 2016-02-23 DIAGNOSIS — S80211A Abrasion, right knee, initial encounter: Secondary | ICD-10-CM | POA: Insufficient documentation

## 2016-02-23 DIAGNOSIS — R112 Nausea with vomiting, unspecified: Secondary | ICD-10-CM | POA: Diagnosis not present

## 2016-02-23 DIAGNOSIS — Z7901 Long term (current) use of anticoagulants: Secondary | ICD-10-CM | POA: Diagnosis not present

## 2016-02-23 DIAGNOSIS — I4891 Unspecified atrial fibrillation: Secondary | ICD-10-CM | POA: Diagnosis present

## 2016-02-23 DIAGNOSIS — S90931S Unspecified superficial injury of right great toe, sequela: Secondary | ICD-10-CM | POA: Insufficient documentation

## 2016-02-23 DIAGNOSIS — K219 Gastro-esophageal reflux disease without esophagitis: Secondary | ICD-10-CM | POA: Diagnosis not present

## 2016-02-23 DIAGNOSIS — N183 Chronic kidney disease, stage 3 unspecified: Secondary | ICD-10-CM | POA: Diagnosis present

## 2016-02-23 DIAGNOSIS — I130 Hypertensive heart and chronic kidney disease with heart failure and stage 1 through stage 4 chronic kidney disease, or unspecified chronic kidney disease: Secondary | ICD-10-CM | POA: Diagnosis not present

## 2016-02-23 DIAGNOSIS — E785 Hyperlipidemia, unspecified: Secondary | ICD-10-CM | POA: Insufficient documentation

## 2016-02-23 DIAGNOSIS — S91101A Unspecified open wound of right great toe without damage to nail, initial encounter: Secondary | ICD-10-CM | POA: Insufficient documentation

## 2016-02-23 DIAGNOSIS — E1165 Type 2 diabetes mellitus with hyperglycemia: Secondary | ICD-10-CM

## 2016-02-23 DIAGNOSIS — Z6836 Body mass index (BMI) 36.0-36.9, adult: Secondary | ICD-10-CM | POA: Insufficient documentation

## 2016-02-23 DIAGNOSIS — I447 Left bundle-branch block, unspecified: Secondary | ICD-10-CM | POA: Diagnosis present

## 2016-02-23 DIAGNOSIS — D696 Thrombocytopenia, unspecified: Secondary | ICD-10-CM | POA: Diagnosis not present

## 2016-02-23 DIAGNOSIS — X58XXXA Exposure to other specified factors, initial encounter: Secondary | ICD-10-CM | POA: Diagnosis not present

## 2016-02-23 DIAGNOSIS — Z952 Presence of prosthetic heart valve: Secondary | ICD-10-CM

## 2016-02-23 DIAGNOSIS — Z953 Presence of xenogenic heart valve: Secondary | ICD-10-CM | POA: Insufficient documentation

## 2016-02-23 DIAGNOSIS — I69351 Hemiplegia and hemiparesis following cerebral infarction affecting right dominant side: Secondary | ICD-10-CM | POA: Insufficient documentation

## 2016-02-23 DIAGNOSIS — Z7902 Long term (current) use of antithrombotics/antiplatelets: Secondary | ICD-10-CM | POA: Insufficient documentation

## 2016-02-23 DIAGNOSIS — R531 Weakness: Secondary | ICD-10-CM | POA: Diagnosis not present

## 2016-02-23 DIAGNOSIS — I482 Chronic atrial fibrillation: Secondary | ICD-10-CM | POA: Diagnosis not present

## 2016-02-23 DIAGNOSIS — I639 Cerebral infarction, unspecified: Secondary | ICD-10-CM | POA: Diagnosis present

## 2016-02-23 DIAGNOSIS — E039 Hypothyroidism, unspecified: Secondary | ICD-10-CM | POA: Insufficient documentation

## 2016-02-23 DIAGNOSIS — I480 Paroxysmal atrial fibrillation: Secondary | ICD-10-CM | POA: Insufficient documentation

## 2016-02-23 DIAGNOSIS — E669 Obesity, unspecified: Secondary | ICD-10-CM | POA: Diagnosis not present

## 2016-02-23 DIAGNOSIS — R42 Dizziness and giddiness: Secondary | ICD-10-CM | POA: Diagnosis not present

## 2016-02-23 DIAGNOSIS — Z951 Presence of aortocoronary bypass graft: Secondary | ICD-10-CM | POA: Insufficient documentation

## 2016-02-23 DIAGNOSIS — I2510 Atherosclerotic heart disease of native coronary artery without angina pectoris: Secondary | ICD-10-CM | POA: Insufficient documentation

## 2016-02-23 DIAGNOSIS — Z79899 Other long term (current) drug therapy: Secondary | ICD-10-CM | POA: Insufficient documentation

## 2016-02-23 DIAGNOSIS — L97513 Non-pressure chronic ulcer of other part of right foot with necrosis of muscle: Secondary | ICD-10-CM | POA: Diagnosis not present

## 2016-02-23 DIAGNOSIS — Z794 Long term (current) use of insulin: Secondary | ICD-10-CM

## 2016-02-23 DIAGNOSIS — W19XXXA Unspecified fall, initial encounter: Secondary | ICD-10-CM | POA: Insufficient documentation

## 2016-02-23 DIAGNOSIS — R2681 Unsteadiness on feet: Secondary | ICD-10-CM | POA: Insufficient documentation

## 2016-02-23 DIAGNOSIS — F419 Anxiety disorder, unspecified: Secondary | ICD-10-CM | POA: Diagnosis not present

## 2016-02-23 DIAGNOSIS — I5042 Chronic combined systolic (congestive) and diastolic (congestive) heart failure: Secondary | ICD-10-CM | POA: Diagnosis present

## 2016-02-23 DIAGNOSIS — E119 Type 2 diabetes mellitus without complications: Secondary | ICD-10-CM

## 2016-02-23 DIAGNOSIS — R0602 Shortness of breath: Secondary | ICD-10-CM | POA: Diagnosis not present

## 2016-02-23 DIAGNOSIS — Z95828 Presence of other vascular implants and grafts: Secondary | ICD-10-CM | POA: Insufficient documentation

## 2016-02-23 LAB — URINALYSIS, ROUTINE W REFLEX MICROSCOPIC
BILIRUBIN URINE: NEGATIVE
Hgb urine dipstick: NEGATIVE
KETONES UR: NEGATIVE mg/dL
Leukocytes, UA: NEGATIVE
NITRITE: NEGATIVE
PH: 7 (ref 5.0–8.0)
Protein, ur: NEGATIVE mg/dL
SPECIFIC GRAVITY, URINE: 1.023 (ref 1.005–1.030)

## 2016-02-23 LAB — I-STAT CHEM 8, ED
BUN: 18 mg/dL (ref 6–20)
CALCIUM ION: 1.15 mmol/L (ref 1.12–1.23)
CHLORIDE: 103 mmol/L (ref 101–111)
Creatinine, Ser: 1.2 mg/dL (ref 0.61–1.24)
Glucose, Bld: 315 mg/dL — ABNORMAL HIGH (ref 65–99)
HCT: 39 % (ref 39.0–52.0)
HEMOGLOBIN: 13.3 g/dL (ref 13.0–17.0)
Potassium: 4.2 mmol/L (ref 3.5–5.1)
SODIUM: 141 mmol/L (ref 135–145)
TCO2: 27 mmol/L (ref 0–100)

## 2016-02-23 LAB — CBC
HCT: 39.6 % (ref 39.0–52.0)
Hemoglobin: 13.2 g/dL (ref 13.0–17.0)
MCH: 29.3 pg (ref 26.0–34.0)
MCHC: 33.3 g/dL (ref 30.0–36.0)
MCV: 88 fL (ref 78.0–100.0)
PLATELETS: 134 10*3/uL — AB (ref 150–400)
RBC: 4.5 MIL/uL (ref 4.22–5.81)
RDW: 13 % (ref 11.5–15.5)
WBC: 6.3 10*3/uL (ref 4.0–10.5)

## 2016-02-23 LAB — COMPREHENSIVE METABOLIC PANEL
ALBUMIN: 4 g/dL (ref 3.5–5.0)
ALT: 62 U/L (ref 17–63)
ANION GAP: 8 (ref 5–15)
AST: 52 U/L — ABNORMAL HIGH (ref 15–41)
Alkaline Phosphatase: 43 U/L (ref 38–126)
BUN: 16 mg/dL (ref 6–20)
CHLORIDE: 105 mmol/L (ref 101–111)
CO2: 24 mmol/L (ref 22–32)
Calcium: 8.8 mg/dL — ABNORMAL LOW (ref 8.9–10.3)
Creatinine, Ser: 1.3 mg/dL — ABNORMAL HIGH (ref 0.61–1.24)
GFR calc non Af Amer: 55 mL/min — ABNORMAL LOW (ref >60)
GLUCOSE: 310 mg/dL — AB (ref 65–99)
POTASSIUM: 4.2 mmol/L (ref 3.5–5.1)
SODIUM: 137 mmol/L (ref 135–145)
Total Bilirubin: 0.8 mg/dL (ref 0.3–1.2)
Total Protein: 6.2 g/dL — ABNORMAL LOW (ref 6.5–8.1)

## 2016-02-23 LAB — DIFFERENTIAL
BASOS PCT: 1 %
Basophils Absolute: 0 10*3/uL (ref 0.0–0.1)
EOS ABS: 0.2 10*3/uL (ref 0.0–0.7)
EOS PCT: 2 %
LYMPHS PCT: 22 %
Lymphs Abs: 1.4 10*3/uL (ref 0.7–4.0)
MONO ABS: 0.6 10*3/uL (ref 0.1–1.0)
Monocytes Relative: 9 %
NEUTROS PCT: 66 %
Neutro Abs: 4.1 10*3/uL (ref 1.7–7.7)

## 2016-02-23 LAB — RAPID URINE DRUG SCREEN, HOSP PERFORMED
AMPHETAMINES: NOT DETECTED
Barbiturates: NOT DETECTED
Benzodiazepines: POSITIVE — AB
COCAINE: NOT DETECTED
OPIATES: NOT DETECTED
TETRAHYDROCANNABINOL: NOT DETECTED

## 2016-02-23 LAB — I-STAT TROPONIN, ED: Troponin i, poc: 0.01 ng/mL (ref 0.00–0.08)

## 2016-02-23 LAB — APTT: aPTT: 33 seconds (ref 24–37)

## 2016-02-23 LAB — PROTIME-INR
INR: 1.2 (ref 0.00–1.49)
PROTHROMBIN TIME: 15.3 s — AB (ref 11.6–15.2)

## 2016-02-23 LAB — CBG MONITORING, ED: Glucose-Capillary: 300 mg/dL — ABNORMAL HIGH (ref 65–99)

## 2016-02-23 LAB — URINE MICROSCOPIC-ADD ON

## 2016-02-23 LAB — BRAIN NATRIURETIC PEPTIDE: B Natriuretic Peptide: 128.6 pg/mL — ABNORMAL HIGH (ref 0.0–100.0)

## 2016-02-23 LAB — LIPASE, BLOOD: LIPASE: 36 U/L (ref 11–51)

## 2016-02-23 MED ORDER — INSULIN GLARGINE 100 UNIT/ML ~~LOC~~ SOLN: 25.0000 [IU] | Freq: Two times a day (BID) | SUBCUTANEOUS | Status: DC

## 2016-02-23 MED ORDER — AMIODARONE HCL 200 MG PO TABS
200.0000 mg | ORAL_TABLET | Freq: Every day | ORAL | Status: DC
  Administered 2016-02-24 – 2016-02-25 (×2): 200 mg via ORAL
  Filled 2016-02-23 (×2): qty 1

## 2016-02-23 MED ORDER — METOPROLOL SUCCINATE ER 25 MG PO TB24
25.0000 mg | ORAL_TABLET | Freq: Every day | ORAL | Status: DC
  Administered 2016-02-24 – 2016-02-25 (×2): 25 mg via ORAL
  Filled 2016-02-23 (×2): qty 1

## 2016-02-23 MED ORDER — GABAPENTIN 300 MG PO CAPS
300.0000 mg | ORAL_CAPSULE | Freq: Two times a day (BID) | ORAL | Status: DC
  Administered 2016-02-24 – 2016-02-25 (×4): 300 mg via ORAL
  Filled 2016-02-23 (×4): qty 1

## 2016-02-23 MED ORDER — SODIUM CHLORIDE 0.9 % IV SOLN
INTRAVENOUS | Status: DC
  Administered 2016-02-23: 23:00:00 via INTRAVENOUS

## 2016-02-23 MED ORDER — FERROUS SULFATE 325 (65 FE) MG PO TABS
325.0000 mg | ORAL_TABLET | Freq: Every day | ORAL | Status: DC
  Administered 2016-02-24 – 2016-02-25 (×2): 325 mg via ORAL
  Filled 2016-02-23 (×2): qty 1

## 2016-02-23 MED ORDER — LORAZEPAM 2 MG/ML IJ SOLN
0.5000 mg | Freq: Once | INTRAMUSCULAR | Status: AC
  Administered 2016-02-23: 0.5 mg via INTRAVENOUS
  Filled 2016-02-23: qty 1

## 2016-02-23 MED ORDER — SENNOSIDES-DOCUSATE SODIUM 8.6-50 MG PO TABS: 1.0000 | ORAL_TABLET | Freq: Every evening | ORAL | Status: DC | PRN

## 2016-02-23 MED ORDER — ASPIRIN EC 81 MG PO TBEC
81.0000 mg | DELAYED_RELEASE_TABLET | Freq: Every day | ORAL | Status: DC
Start: 2016-02-24 — End: 2016-02-24

## 2016-02-23 MED ORDER — APIXABAN 5 MG PO TABS
5.0000 mg | ORAL_TABLET | Freq: Two times a day (BID) | ORAL | Status: DC
  Administered 2016-02-24 – 2016-02-25 (×4): 5 mg via ORAL
  Filled 2016-02-23 (×4): qty 1

## 2016-02-23 MED ORDER — ATORVASTATIN CALCIUM 10 MG PO TABS
20.0000 mg | ORAL_TABLET | Freq: Every day | ORAL | Status: DC
  Administered 2016-02-24 – 2016-02-25 (×2): 20 mg via ORAL
  Filled 2016-02-23 (×2): qty 2

## 2016-02-23 MED ORDER — ONDANSETRON HCL 4 MG/2ML IJ SOLN
4.0000 mg | Freq: Once | INTRAMUSCULAR | Status: AC
  Administered 2016-02-23: 4 mg via INTRAVENOUS
  Filled 2016-02-23: qty 2

## 2016-02-23 MED ORDER — ACETAMINOPHEN 500 MG PO TABS: 1000.0000 mg | ORAL_TABLET | Freq: Two times a day (BID) | ORAL | Status: DC | PRN

## 2016-02-23 MED ORDER — FENOFIBRATE 160 MG PO TABS
160.0000 mg | ORAL_TABLET | Freq: Every day | ORAL | Status: DC
  Administered 2016-02-24 – 2016-02-25 (×2): 160 mg via ORAL
  Filled 2016-02-23 (×2): qty 1

## 2016-02-23 MED ORDER — HYDROCODONE-ACETAMINOPHEN 10-325 MG PO TABS
0.5000 | ORAL_TABLET | Freq: Two times a day (BID) | ORAL | Status: DC
  Administered 2016-02-24 – 2016-02-25 (×4): 0.5 via ORAL
  Filled 2016-02-23 (×4): qty 1

## 2016-02-23 MED ORDER — STROKE: EARLY STAGES OF RECOVERY BOOK
Freq: Once | Status: AC
  Administered 2016-02-24: 01:00:00
  Filled 2016-02-23: qty 1

## 2016-02-23 MED ORDER — PANTOPRAZOLE SODIUM 40 MG PO TBEC
40.0000 mg | DELAYED_RELEASE_TABLET | Freq: Two times a day (BID) | ORAL | Status: DC
  Administered 2016-02-24 – 2016-02-25 (×4): 40 mg via ORAL
  Filled 2016-02-23 (×4): qty 1

## 2016-02-23 MED ORDER — DOCUSATE SODIUM 100 MG PO CAPS
100.0000 mg | ORAL_CAPSULE | Freq: Two times a day (BID) | ORAL | Status: DC
  Administered 2016-02-24 – 2016-02-25 (×4): 100 mg via ORAL
  Filled 2016-02-23 (×4): qty 1

## 2016-02-23 MED ORDER — INSULIN ASPART 100 UNIT/ML ~~LOC~~ SOLN
0.0000 [IU] | Freq: Three times a day (TID) | SUBCUTANEOUS | Status: DC
  Administered 2016-02-24: 11 [IU] via SUBCUTANEOUS
  Administered 2016-02-24: 8 [IU] via SUBCUTANEOUS
  Administered 2016-02-24: 11 [IU] via SUBCUTANEOUS

## 2016-02-23 MED ORDER — ALPRAZOLAM 0.25 MG PO TABS: 1.0000 mg | ORAL_TABLET | Freq: Four times a day (QID) | ORAL | Status: DC | PRN

## 2016-02-23 MED ORDER — LEVOTHYROXINE SODIUM 25 MCG PO TABS
25.0000 ug | ORAL_TABLET | Freq: Every day | ORAL | Status: DC
  Administered 2016-02-24 – 2016-02-25 (×2): 25 ug via ORAL
  Filled 2016-02-23 (×2): qty 1

## 2016-02-23 MED ORDER — ASPIRIN 81 MG PO CHEW
324.0000 mg | CHEWABLE_TABLET | Freq: Once | ORAL | Status: AC
  Administered 2016-02-23: 324 mg via ORAL
  Filled 2016-02-23: qty 4

## 2016-02-23 MED ORDER — SILVER SULFADIAZINE 1 % EX CREA
1.0000 "application " | TOPICAL_CREAM | Freq: Two times a day (BID) | CUTANEOUS | Status: DC
  Administered 2016-02-24 (×2): 1 via TOPICAL
  Filled 2016-02-23: qty 85

## 2016-02-23 MED ORDER — OMEGA-3-ACID ETHYL ESTERS 1 G PO CAPS
1.0000 g | ORAL_CAPSULE | Freq: Every day | ORAL | Status: DC
  Administered 2016-02-24 – 2016-02-25 (×2): 1 g via ORAL
  Filled 2016-02-23 (×2): qty 1

## 2016-02-23 MED ORDER — HYDROXYZINE HCL 50 MG/ML IM SOLN
25.0000 mg | Freq: Four times a day (QID) | INTRAMUSCULAR | Status: DC | PRN
  Filled 2016-02-23: qty 0.5

## 2016-02-23 NOTE — ED Notes (Signed)
Per EMS pt was found at [facility], very weak, pale, diaphoric, N/V.  Was transferred here due to prior cardiac Hx, stints placed at [facility].  Denies all pain, EKG indicated LBBB.  Was given 4 of zofran and 200mg  of NS.

## 2016-02-23 NOTE — ED Provider Notes (Signed)
CSN: [id number]     Arrival date & time 02/23/16  1939 History   First MD Initiated Contact with Patient 02/23/16 1944     Chief Complaint  Patient presents with  . Weakness  . Emesis     (Consider location/radiation/quality/duration/timing/severity/associated sxs/prior Treatment) Patient is a 70 y.o. male presenting with weakness, vomiting, and dizziness. The history is provided by the patient and the EMS personnel.  Weakness Associated symptoms include diaphoresis, nausea, vomiting and weakness. Pertinent negatives include no abdominal pain, chest pain, chills, congestion, coughing, fatigue, fever, headaches, neck pain, numbness or rash.  Emesis Associated symptoms: no abdominal pain, no chills, no diarrhea and no headaches   Dizziness Quality:  Head spinning, imbalance and room spinning Severity:  Severe Onset quality:  Sudden Duration:  1 hour Timing:  Constant Progression:  Partially resolved Chronicity:  New Context: not when bending over and not with head movement   Context comment:  Riding on electric wheelchair at department store Relieved by:  Nothing Worsened by:  Nothing Ineffective treatments:  None tried Associated symptoms: nausea, tinnitus (chronic), vomiting and weakness   Associated symptoms: no chest pain, no diarrhea, no headaches, no hearing loss, no palpitations, no shortness of breath and no syncope     Past Medical History  Diagnosis Date  . Hypertension   . Diabetes mellitus     type 2   . History of valve replacement 2010    [facility] Ease bioprosthetic 69mm  . Thyroid disease   . Stroke [facility]) 2003    R-sided weakness & upper extremity swelling   . Coronary artery disease   . Dyslipidemia   . At risk for sudden cardiac death Jun 10, 2013  . PAF (paroxysmal atrial fibrillation) ([place]) 05/2013    converted with amiodarone to SR  . Chronic anticoagulation 05/2013    started  . S/P cardiac catheterization, Rt & Lt heart cath 06/05/2013  06-10-13  . S/P CABG x 3 2010  . GERD (gastroesophageal reflux disease)   . Cataract   . Chronic kidney disease     Patient reports he is seeing Kidney doctor in September   Past Surgical History  Procedure Laterality Date  . Coronary artery bypass graft  01/26/2009    LIMA to LAD, SVG to circumflex, SVG to PDA  . Aortic valve replacement  01/26/2009    [facility] Ease bioprosthetic 19mm valve  . Transthoracic echocardiogram  09/2011    grade 1 diastolic dysfunction; increasing valve gradient; calcified MV annulus   . Cardiac catheterization  06/05/2013    native 3 vessel disease; patent LIMA to LAD, SVG to OM and SVG to PDA; Elevated right and left heart filling pressures, with predominantly pulmonary venous hypertension, normal PVR  . Transthoracic echocardiogram  06/03/2013    EF 25-30%, mod conc. hypertrophy, grade 3 diastolic dysfunction; LA mod dilated; calcified MV annulus; transaortic gradients are normal for the bioprosthetic valve; inf vena cava dilated (elevated CVP) - LifeVest  . Left and right heart catheterization with coronary angiogram N/A 06/05/2013    Procedure: LEFT AND RIGHT HEART CATHETERIZATION WITH CORONARY ANGIOGRAM;  Surgeon: Leonie Man, MD;  [facility];  Service: Cardiovascular;  Laterality: N/A;  . Graft(s) angiogram  06/05/2013    Procedure: GRAFT(S) Cyril Loosen;  Surgeon: Leonie Man, MD;  Location: [facility] LAB;  Service: Cardiovascular;;  . Colonoscopy  2007    Dr. Aviva Signs: internal hemorrhoids   Family History  Problem Relation Age of Onset  .  Heart attack Mother   . Liver disease Brother   . Diabetes Sister     x4   Social History  Substance Use Topics  . Smoking status: Former Smoker    Types: Cigarettes    Quit date: 08/15/2001  . Smokeless tobacco: Former Systems developer    Types: Chew     Comment: Quit in 2003  . Alcohol Use: No    Review of Systems  Constitutional: Positive for diaphoresis. Negative for fever, chills and  fatigue.  HENT: Positive for tinnitus (chronic). Negative for congestion and hearing loss.   Eyes: Negative for visual disturbance.  Respiratory: Negative for cough, chest tightness and shortness of breath.   Cardiovascular: Negative for chest pain, palpitations and syncope.  Gastrointestinal: Positive for nausea and vomiting. Negative for abdominal pain and diarrhea.  Genitourinary: Negative for dysuria and flank pain.  Musculoskeletal: Negative for neck pain.  Skin: Negative for rash.  Neurological: Positive for dizziness and weakness. Negative for numbness and headaches.  Psychiatric/Behavioral: Negative for confusion and agitation.      Allergies  Review of patient's allergies indicates no known allergies.  Home Medications   Prior to Admission medications   Medication Sig Start Date End Date Taking? Authorizing Provider  acetaminophen (TYLENOL) 500 MG tablet Take 1,000 mg by mouth 2 (two) times daily as needed for headache. For pain/headaches    Historical Provider, MD  ALPRAZolam Duanne Moron) 1 MG tablet Take 1 mg by mouth 4 (four) times daily as needed for anxiety.     Historical Provider, MD  amiodarone (PACERONE) 200 MG tablet TAKE ONE (1) TABLET EACH DAY 07/15/15   Pixie Casino, MD  atorvastatin (LIPITOR) 20 MG tablet Take 1 tablet (20 mg total) by mouth daily at 6 PM. 10/30/14   Pixie Casino, MD  celecoxib (CELEBREX) 200 MG capsule Take 1 capsule by mouth every evening.  04/07/14   Historical Provider, MD  clopidogrel (PLAVIX) 75 MG tablet Take 1 tablet (75 mg total) by mouth daily. Patient taking differently: Take 75 mg by mouth every evening.  04/25/15   Hedy Jacob, PA-C  docusate sodium (COLACE) 100 MG capsule Take 100 mg by mouth 2 (two) times daily.      Historical Provider, MD  ELIQUIS 5 MG TABS tablet TAKE ONE TABLET TWICE DAILY 11/09/15   Pixie Casino, MD  fenofibrate (TRICOR) 145 MG tablet TAKE ONE (1) TABLET BY MOUTH EVERY DAY 02/11/16   Pixie Casino, MD   ferrous sulfate 325 (65 FE) MG EC tablet Take 325 mg by mouth daily with breakfast.    Historical Provider, MD  gabapentin (NEURONTIN) 300 MG capsule Take 300 mg by mouth 2 (two) times daily.     Historical Provider, MD  HYDROcodone-acetaminophen (NORCO) 10-325 MG per tablet Take 0.5 tablets by mouth 2 (two) times daily.  03/31/14   Historical Provider, MD  hydrocortisone (PROCTOSOL HC) 2.5 % rectal cream Place 1 application rectally 2 (two) times daily. For 10 days 07/28/15   Mahala Menghini, PA-C  insulin glargine (LANTUS) 100 UNIT/ML injection Inject 60-75 Units into the skin 2 (two) times daily. Takes 75 units in the morning and 60 units in the evening    Historical Provider, MD  insulin lispro (HUMALOG) 100 UNIT/ML injection Inject 35-50 Units into the skin 3 (three) times daily before meals.     Historical Provider, MD  levothyroxine (SYNTHROID, LEVOTHROID) 25 MCG tablet Take 25 mcg by mouth daily.      Historical  Provider, MD  metoprolol succinate (TOPROL-XL) 25 MG 24 hr tablet TAKE ONE (1) TABLET EACH DAY 10/02/15   Pixie Casino, MD  Omega-3 Fatty Acids (FISH OIL) 1200 MG CAPS Take 1,200 mg by mouth 2 (two) times daily.    Historical Provider, MD  pantoprazole (PROTONIX) 40 MG tablet Take 1 tablet (40 mg total) by mouth 2 (two) times daily. 10/30/14   Pixie Casino, MD  polyethylene glycol-electrolytes (NULYTELY/GOLYTELY) 420 G solution Take 4,000 mLs by mouth once. Patient not taking: Reported on 10/07/2015 08/05/15   Mahala Menghini, PA-C  silver sulfADIAZINE (SILVADENE) 1 % cream Apply 1 application topically 2 (two) times daily.     Historical Provider, MD  torsemide (DEMADEX) 10 MG tablet Take 1 tablet (10 mg total) by mouth daily. 06/29/15   Pixie Casino, MD   BP 159/50 mmHg  Pulse 65  Temp(Src) 97.6 F (36.4 C) (Oral)  Resp 21  Ht 5' 10.5" (1.791 m)  Wt 117.028 kg  BMI 36.48 kg/m2  SpO2 94% Physical Exam  Constitutional: He is oriented to person, place, and time. He  appears well-developed and well-nourished. No distress.  Eyes closed because opening them causing vertigo  HENT:  Head: Normocephalic and atraumatic.  Eyes: Conjunctivae are normal.  Cardiovascular: Normal rate and normal heart sounds.   No murmur heard. Distant heart sounds  Pulmonary/Chest: Effort normal.  Crackles at the bases bilaterally. Faint end expiratory wheezes anterior.   Abdominal: Soft. There is no tenderness.  Musculoskeletal: He exhibits edema (1+ bilateral lower extremity).  Neurological: He is alert and oriented to person, place, and time. No cranial nerve deficit.  Chronic Right upper and lower extremity weakness. Poor sensation in LUE and LLE patient states is unchanged from baseline.   Skin: Skin is warm. He is not diaphoretic.  Psychiatric: He has a normal mood and affect. His behavior is normal.  Nursing note and vitals reviewed.   ED Course  Procedures (including critical care time) Labs Review Labs Reviewed  PROTIME-INR - Abnormal; Notable for the following:    Prothrombin Time 15.3 (*)    All other components within normal limits  CBC - Abnormal; Notable for the following:    Platelets 134 (*)    All other components within normal limits  COMPREHENSIVE METABOLIC PANEL - Abnormal; Notable for the following:    Glucose, Bld 310 (*)    Creatinine, Ser 1.30 (*)    Calcium 8.8 (*)    Total Protein 6.2 (*)    AST 52 (*)    GFR calc non Af Amer 55 (*)    All other components within normal limits  BRAIN NATRIURETIC PEPTIDE - Abnormal; Notable for the following:    B Natriuretic Peptide 128.6 (*)    All other components within normal limits  CBG MONITORING, ED - Abnormal; Notable for the following:    Glucose-Capillary 300 (*)    All other components within normal limits  I-STAT CHEM 8, ED - Abnormal; Notable for the following:    Glucose, Bld 315 (*)    All other components within normal limits  APTT  DIFFERENTIAL  URINE RAPID DRUG SCREEN, HOSP  PERFORMED  URINALYSIS, ROUTINE W REFLEX MICROSCOPIC (NOT AT [facility])  Randolm Idol, ED    Imaging Review Dg Chest Port 1 View  02/23/2016  CLINICAL DATA:  Weakness and shortness of breath for 1 day EXAM: PORTABLE CHEST 1 VIEW COMPARISON:  None. FINDINGS: Cardiac shadow is mildly enlarged. Postsurgical changes are noted.  Mild vascular prominence is seen no focal edema is noted. Minimal atelectatic changes are noted in the right mid lung. No bony abnormality is seen. IMPRESSION: Mild vascular congestion without pulmonary edema. Electronically Signed   By: Inez Catalina M.D.   On: 02/23/2016 20:39   Ct Head Code Stroke W/o Cm  02/23/2016  CLINICAL DATA:  Dizziness EXAM: CT HEAD WITHOUT CONTRAST TECHNIQUE: Contiguous axial images were obtained from the base of the skull through the vertex without intravenous contrast. COMPARISON:  CT temporal bone 07/03/2012, MRI head 06/26/2012 FINDINGS: Moderate atrophy. Chronic microvascular ischemic change in the white matter. Negative for acute infarct.  Negative for acute hemorrhage or mass. Extensive vascular calcification 1 cm lesion right Peters apex unchanged from prior studies. There is loss of normal trabecular pattern and this is mildly expansile without aggressive destructive features. IMPRESSION: Atrophy and chronic ischemic change. 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 These results were called by telephone at the time of interpretation on 02/23/2016 at 8:41 pm to Dr. Canary Brim, who verbally acknowledged these results. Electronically Signed   By: Franchot Gallo M.D.   On: 02/23/2016 20:40   I have personally reviewed and evaluated these images and lab results as part of my medical decision-making.   EKG Interpretation None      MDM   Final diagnoses:  None   Patient is a 70 year old male with history of hypertension, diabetes, stroke , A. Fib, CABG 3 in 2010, carotid stents  in 2014, and CKD Presenting today for sudden onset vertigo while on an electric scooter in a department store. EMS was called and the patient became diaphoretic and started vomiting. He states the vertigo was severe. On arrival he was keeping his eyes closed due to exacerbation of symptoms when he opened his eyes. He denied any chest pain or shortness of breath due to extensive cardiac history EKG and troponin were obtained. Troponin was undetectable and EKG showed left bundle branch block consistent with previous EKGs. BNP was approximately 100. Patient had CT of the head that showed chronic ischemic changes with no acute intracranial abnormality. Symptoms improved with 4 mg of Zofran and 0.5 of Ativan. He was given an aspirin in the emergency department and was admitted to the hospitalist for stroke rule out. On departure from the emergency department his symptoms were significantly improved.   Allie Bossier, MD 02/23/16 2158  Julianne Rice, MD 02/26/16 [phone]

## 2016-02-23 NOTE — ED Notes (Signed)
Patient transported to CT 

## 2016-02-23 NOTE — ED Notes (Signed)
MD at bedside. 

## 2016-02-23 NOTE — H&P (Signed)
History and Physical    [name] [id number] DOB: 1945/09/17 DOA: 02/23/2016  Referring MD/NP/PA:   PCP: [name]., MD   Patient coming from:  The patient is coming from home.  At baseline, pt is partially dependent for his ADL.       Chief Complaint: Nausea, vomiting, dizziness  HPI: [name] is a 70 y.o. male with medical history significant of hypertension, hyperlipidemia, diabetes mellitus, GERD, anxiety, CAD, s/p of CABG, hypothyroidism, stroke with right-sided paralysis, PAF on Eliquis, CKD-III, chronically combined systolic and diastolic CHF, who presents with nausea, vomiting, dizziness.  Patient reports that he started having nausea, vomiting and dizziness at about 6:30 p.m. He vomited more than 10 times without blood in the vomitus. He feels room spinning around him. He also has generalized weakness and diaphoresis. No ear ringing or hearing loss. No abdominal pain, diarrhea, fever or chills. He has history of stroke with right sided hemiparalysis, which has not changed. No vision change or hearing loss. Patient does not have symptoms of UTI, chest pain, cough, shortness of breath. He has two small chronic skin lesions over right knee area and right great toe.   ED Course: pt was found to have negative urinalysis, INR1.2, PTT 33, lipase 36, troponin negative, BNP 128.6, stable renal function, no tachycardia, Stephanie tachypnea, oxygen desaturation to 89% on room air. Chest x-ray showed mild vascular congestion. 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 MRI-brain showed no acute intracranial process, but showed stable disproportionate cerebellar and brainstem atrophy for age, old cerebellar, pontine and RIGHT caudate infarcts, moderate chronic small vessel ischemic disease. Pt is placed on tele bed for obs.  Review of  Systems:   General: no fevers, chills, no changes in body weight, has poor appetite, has fatigue HEENT: no blurry vision, hearing changes or sore throat Pulm: no dyspnea, coughing, wheezing CV: no chest pain, no palpitations Abd: has nausea, vomiting, no abdominal pain, diarrhea, constipation GU: no dysuria, burning on urination, increased urinary frequency, hematuria  Ext: trace leg edema Neuro: has chronic R weakness. no vision change or hearing loss Skin: has two small skin lesions over right knee and right great toe. MSK: No muscle spasm, no deformity, no limitation of range of movement in spin Heme: No easy bruising.  Travel history: No recent long distant travel.  Allergy: No Known Allergies  Past Medical History  Diagnosis Date  . Hypertension   . Diabetes mellitus     type 2   . History of valve replacement 2010    [facility] Ease bioprosthetic 16mm  . Thyroid disease   . Stroke [facility]) 2003    R-sided weakness & upper extremity swelling   . Coronary artery disease   . Dyslipidemia   . At risk for sudden cardiac death 06-26-2013  . PAF (paroxysmal atrial fibrillation) ([place]) 05/2013    converted with amiodarone to SR  . Chronic anticoagulation 05/2013    started  . S/P cardiac catheterization, Rt & Lt heart cath 06/05/2013 2013/06/26  . S/P CABG x 3 2010  . GERD (gastroesophageal reflux disease)   . Cataract   . Chronic kidney disease     Patient reports he is seeing Kidney doctor in September    Past Surgical History  Procedure Laterality Date  . Coronary artery bypass graft  01/26/2009    LIMA to LAD, SVG to  circumflex, SVG to PDA  . Aortic valve replacement  01/26/2009    [facility] Ease bioprosthetic 36mm valve  . Transthoracic echocardiogram  09/2011    grade 1 diastolic dysfunction; increasing valve gradient; calcified MV annulus   . Cardiac catheterization  06/05/2013    native 3 vessel disease; patent LIMA to LAD, SVG to OM and SVG to PDA; Elevated  right and left heart filling pressures, with predominantly pulmonary venous hypertension, normal PVR  . Transthoracic echocardiogram  06/03/2013    EF 25-30%, mod conc. hypertrophy, grade 3 diastolic dysfunction; LA mod dilated; calcified MV annulus; transaortic gradients are normal for the bioprosthetic valve; inf vena cava dilated (elevated CVP) - LifeVest  . Left and right heart catheterization with coronary angiogram N/A 06/05/2013    Procedure: LEFT AND RIGHT HEART CATHETERIZATION WITH CORONARY ANGIOGRAM;  Surgeon: Leonie Man, MD;  Location: [facility] CATH LAB;  Service: Cardiovascular;  Laterality: N/A;  . Graft(s) angiogram  06/05/2013    Procedure: GRAFT(S) Cyril Loosen;  Surgeon: Leonie Man, MD;  Location: [facility] CATH LAB;  Service: Cardiovascular;;  . Colonoscopy  2007    Dr. Aviva Signs: internal hemorrhoids    Social History:  reports that he quit smoking about 14 years ago. His smoking use included Cigarettes. He has quit using smokeless tobacco. His smokeless tobacco use included Chew. He reports that he does not drink alcohol or use illicit drugs.  Family History:  Family History  Problem Relation Age of Onset  . Heart attack Mother   . Liver disease Brother   . Diabetes Sister     x4     Prior to Admission medications   Medication Sig Start Date End Date Taking? Authorizing Provider  acetaminophen (TYLENOL) 500 MG tablet Take 1,000 mg by mouth 2 (two) times daily as needed for headache. For pain/headaches    Historical Provider, MD  ALPRAZolam Duanne Moron) 1 MG tablet Take 1 mg by mouth 4 (four) times daily as needed for anxiety.     Historical Provider, MD  amiodarone (PACERONE) 200 MG tablet TAKE ONE (1) TABLET EACH DAY 07/15/15   Pixie Casino, MD  atorvastatin (LIPITOR) 20 MG tablet Take 1 tablet (20 mg total) by mouth daily at 6 PM. 10/30/14   Pixie Casino, MD  celecoxib (CELEBREX) 200 MG capsule Take 1 capsule by mouth every evening.  04/07/14   Historical Provider, MD    clopidogrel (PLAVIX) 75 MG tablet Take 1 tablet (75 mg total) by mouth daily. Patient taking differently: Take 75 mg by mouth every evening.  04/25/15   Hedy Jacob, PA-C  docusate sodium (COLACE) 100 MG capsule Take 100 mg by mouth 2 (two) times daily.      Historical Provider, MD  ELIQUIS 5 MG TABS tablet TAKE ONE TABLET TWICE DAILY 11/09/15   Pixie Casino, MD  fenofibrate (TRICOR) 145 MG tablet TAKE ONE (1) TABLET BY MOUTH EVERY DAY 02/11/16   Pixie Casino, MD  ferrous sulfate 325 (65 FE) MG EC tablet Take 325 mg by mouth daily with breakfast.    Historical Provider, MD  gabapentin (NEURONTIN) 300 MG capsule Take 300 mg by mouth 2 (two) times daily.     Historical Provider, MD  HYDROcodone-acetaminophen (NORCO) 10-325 MG per tablet Take 0.5 tablets by mouth 2 (two) times daily.  03/31/14   Historical Provider, MD  hydrocortisone (PROCTOSOL HC) 2.5 % rectal cream Place 1 application rectally 2 (two) times daily. For 10 days 07/28/15  Mahala Menghini, PA-C  insulin glargine (LANTUS) 100 UNIT/ML injection Inject 60-75 Units into the skin 2 (two) times daily. Takes 75 units in the morning and 60 units in the evening    Historical Provider, MD  insulin lispro (HUMALOG) 100 UNIT/ML injection Inject 35-50 Units into the skin 3 (three) times daily before meals.     Historical Provider, MD  levothyroxine (SYNTHROID, LEVOTHROID) 25 MCG tablet Take 25 mcg by mouth daily.      Historical Provider, MD  metoprolol succinate (TOPROL-XL) 25 MG 24 hr tablet TAKE ONE (1) TABLET EACH DAY 10/02/15   Pixie Casino, MD  Omega-3 Fatty Acids (FISH OIL) 1200 MG CAPS Take 1,200 mg by mouth 2 (two) times daily.    Historical Provider, MD  pantoprazole (PROTONIX) 40 MG tablet Take 1 tablet (40 mg total) by mouth 2 (two) times daily. 10/30/14   Pixie Casino, MD  polyethylene glycol-electrolytes (NULYTELY/GOLYTELY) 420 G solution Take 4,000 mLs by mouth once. Patient not taking: Reported on 10/07/2015 08/05/15    Mahala Menghini, PA-C  silver sulfADIAZINE (SILVADENE) 1 % cream Apply 1 application topically 2 (two) times daily.     Historical Provider, MD  torsemide (DEMADEX) 10 MG tablet Take 1 tablet (10 mg total) by mouth daily. 06/29/15   Pixie Casino, MD    Physical Exam: Filed Vitals:   02/23/16 2215 02/23/16 2230 02/24/16 0002 02/24/16 0003  BP: 179/70 167/71 184/70   Pulse: 67 66  67  Temp:      TempSrc:      Resp: 18 19    Height:      Weight:      SpO2: 94% 95%  95%   General: Not in acute distress HEENT:       Eyes: PERRL, EOMI, no scleral icterus.       ENT: No discharge from the ears and nose, no pharynx injection, no tonsillar enlargement.        Neck: No JVD, no bruit, no mass felt. Heme: No neck lymph node enlargement. Cardiac: S1/S2, RRR, No murmurs, No gallops or rubs. Pulm: No rales, wheezing, rhonchi or rubs. Abd: Soft, nondistended, nontender, no rebound pain, no organomegaly, BS present. GU: No hematuria Ext: has trace leg edema bilaterally. 2+DP/PT pulse bilaterally. Musculoskeletal: No joint deformities, No joint redness or warmth, no limitation of ROM in spin. Skin:  He has two small chronic skin lesions over right knee area and right great toe, does not seem to be infected.  Neuro: Alert, oriented X3, cranial nerves II-XII grossly intact. Muscle strength 2/5 in right arm and leg, 5.5 in left extremities, sensation to light touch intact. Brachial reflex 2+ bilaterally. Negative Babinski's sign. Psych: Patient is not psychotic, no suicidal or hemocidal ideation.  Labs on Admission: I have personally reviewed following labs and imaging studies  CBC:  Recent Labs Lab 02/23/16 1956 02/23/16 2026  WBC 6.3  --   NEUTROABS 4.1  --   HGB 13.2 13.3  HCT 39.6 39.0  MCV 88.0  --   PLT 134*  --    Basic Metabolic Panel:  Recent Labs Lab 02/23/16 1956 02/23/16 2026  NA 137 141  K 4.2 4.2  CL 105 103  CO2 24  --   GLUCOSE 310* 315*  BUN 16 18  CREATININE  1.30* 1.20  CALCIUM 8.8*  --    GFR: Estimated Creatinine Clearance: 77.1 mL/min (by C-G formula based on Cr of 1.2). Liver Function Tests:  Recent Labs Lab 02/23/16 1956  AST 52*  ALT 62  ALKPHOS 43  BILITOT 0.8  PROT 6.2*  ALBUMIN 4.0    Recent Labs Lab 02/23/16 2238  LIPASE 36   No results for input(s): AMMONIA in the last 168 hours. Coagulation Profile:  Recent Labs Lab 02/23/16 1956  INR 1.20   Cardiac Enzymes: No results for input(s): CKTOTAL, CKMB, CKMBINDEX, TROPONINI in the last 168 hours. BNP (last 3 results) No results for input(s): PROBNP in the last 8760 hours. HbA1C: No results for input(s): HGBA1C in the last 72 hours. CBG:  Recent Labs Lab 02/23/16 1959  GLUCAP 300*   Lipid Profile: No results for input(s): CHOL, HDL, LDLCALC, TRIG, CHOLHDL, LDLDIRECT in the last 72 hours. Thyroid Function Tests: No results for input(s): TSH, T4TOTAL, FREET4, T3FREE, THYROIDAB in the last 72 hours. Anemia Panel: No results for input(s): VITAMINB12, FOLATE, FERRITIN, TIBC, IRON, RETICCTPCT in the last 72 hours. Urine analysis:    Component Value Date/Time   COLORURINE STRAW* 02/23/2016 2140   APPEARANCEUR CLEAR 02/23/2016 2140   LABSPEC 1.023 02/23/2016 2140   PHURINE 7.0 02/23/2016 2140   GLUCOSEU >1000* 02/23/2016 2140   HGBUR NEGATIVE 02/23/2016 2140   BILIRUBINUR NEGATIVE 02/23/2016 2140   KETONESUR NEGATIVE 02/23/2016 2140   PROTEINUR NEGATIVE 02/23/2016 2140   UROBILINOGEN 0.2 11/25/2013 1741   NITRITE NEGATIVE 02/23/2016 2140   LEUKOCYTESUR NEGATIVE 02/23/2016 2140   Sepsis Labs: @LABRCNTIP (procalcitonin:4,lacticidven:4) )No results found for this or any previous visit (from the past 240 hour(s)).   Radiological Exams on Admission: Mr Brain Wo Contrast  02/24/2016  CLINICAL DATA:  Weakness, vomiting and dizziness. History of hypertension, diabetes, stroke. EXAM: MRI HEAD WITHOUT CONTRAST TECHNIQUE: Multiplanar, multiecho pulse sequences of  the brain and surrounding structures were obtained without intravenous contrast. COMPARISON:  CT HEAD February 23, 2016 at 2030 hours and MRI head June 27, 2012 FINDINGS: INTRACRANIAL CONTENTS: No reduced diffusion to suggest acute ischemia. No susceptibility artifact to suggest hemorrhage. The ventricles and sulci are normal for patient's age. Stable disproportionate moderate cerebellar and brainstem atrophy. LEFT para median pontine infarct. Old small bilateral cerebellar infarcts are new from 2013. Old RIGHT caudate head lacunar infarct. No suspicious parenchymal signal, masses or mass effect. Patchy supratentorial white matter FLAIR T2 hyperintensities advanced from prior MRI. No abnormal extra-axial fluid collections. No extra-axial masses though, contrast enhanced sequences would be more sensitive. Normal major intracranial vascular flow voids present at skull base. ORBITS: The included ocular globes and orbital contents are non-suspicious. Status post bilateral ocular lens implants. SINUSES: The mastoid air-cells and included paranasal sinuses are well-aerated. Persistent low T1, bright T2 signal RIGHT petrous apex most compatible with fluid. SKULL/SOFT TISSUES: No abnormal sellar expansion. No suspicious calvarial bone marrow signal. Craniocervical junction maintained. Patient is edentulous. IMPRESSION: No acute intracranial process. Stable disproportionate cerebellar and brainstem atrophy for age. Old cerebellar, pontine and RIGHT caudate infarcts. Moderate chronic small vessel ischemic disease. Electronically Signed   By: Elon Alas M.D.   On: 02/24/2016 00:01   Dg Chest Port 1 View  02/23/2016  CLINICAL DATA:  Weakness and shortness of breath for 1 day EXAM: PORTABLE CHEST 1 VIEW COMPARISON:  None. FINDINGS: Cardiac shadow is mildly enlarged. Postsurgical changes are noted. Mild vascular prominence is seen no focal edema is noted. Minimal atelectatic changes are noted in the right mid lung. No  bony abnormality is seen. IMPRESSION: Mild vascular congestion without pulmonary edema. Electronically Signed   By: Linus Mako.D.  On: 02/23/2016 20:39   Ct Head Code Stroke W/o Cm  02/23/2016  CLINICAL DATA:  Dizziness EXAM: CT HEAD WITHOUT CONTRAST TECHNIQUE: Contiguous axial images were obtained from the base of the skull through the vertex without intravenous contrast. COMPARISON:  CT temporal bone 07/03/2012, MRI head 06/26/2012 FINDINGS: Moderate atrophy. Chronic microvascular ischemic change in the white matter. Negative for acute infarct.  Negative for acute hemorrhage or mass. Extensive vascular calcification 1 cm lesion right Peters apex unchanged from prior studies. There is loss of normal trabecular pattern and this is mildly expansile without aggressive destructive features. IMPRESSION: Atrophy and chronic ischemic change. 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 These results were called by telephone at the time of interpretation on 02/23/2016 at 8:41 pm to Dr. Canary Brim, who verbally acknowledged these results. Electronically Signed   By: Franchot Gallo M.D.   On: 02/23/2016 20:40     EKG: Independently reviewed. Sinus rhythm, QTC 519, old left bundle blockage, LAD,  Assessment/Plan Principal Problem:   Dizziness Active Problems:   Stroke- Lt brain 2003   GERD (gastroesophageal reflux disease)   S/P CABG x 3 - 2010   S/P AVR-tissue, 2010   DM2 (diabetes mellitus, type 2) (HCC)   Dyslipidemia   LBBB (left bundle branch block)   Obesity (BMI 30-39.9)   Chronic anticoagulation   A-fib (HCC)   Hypothyroidism   CKD (chronic kidney disease), stage III   Anxiety   Chronic combined systolic (congestive) and diastolic (congestive) heart failure (HCC)   Open wound of right foot   Dizziness and nausea, vomiting: Etiology is not clear. CT-head and MRI of the brain are negative for acute intracranial  abnormalities. Differential diagnosis includes orthostatic status and BPPV. No ear ringing or hearing loss, less likely to have Mnire's disease. Alternatively patient could have acute gastritis, causing dehydration and orthostatic status, leading to dizziness.  - will place on tele bed for obs - ASA was started initially with concerning for possible new stroke-->will d/c ASA. - When necessary meclizine - Check orthostatic vital sign - Vestibular PT - hold diuretics, give gentle IV fluid--> normal saline 75 mL/h for 6 hours - When necessary hydroxyzine for nausea and vomiting  Hx of stroke: with R sided hemiparalysis. -On Eliquis for PAF -Continue Lipitor and TriCor  GERD: -Protonix  CAD: s/p of CABG. No CP. Pt is not taking plavix -continue Lipitor, TriCor, metoprolol  DM-II: Last A1c 8.9, poorly controled. Patient is taking Lantus and Humalog at home -will decrease Lantus dose from 75 units in morning and 35 units in evening--> 55 units in morning and 25 units in evening.  -SSI-moderate  Dyslipidemia: -continue Lipitor and TriCor  Atrial Fibrillation: CHA2DS2-VASc Score is 7, needs oral anticoagulation. Patient is on Eliquis at home. Heart rate is well controlled. -continue Eliquis -continue metoprolol and amiodarone  Hypothyroidism: Last TSH was 1.152 on 06/29/15 -Continue home Synthroid  CKD-III: stable. Baseline creatinine 1.3-1.5. His creatinine is 1.30, BUN 16. -Follow-up renal function by BMP  Anxiety: -prn Xanax  Chronic combined systolic and diastolic congestive heart failure ([place]): 2-D echo on 09/09/13 showed  EF45-50 percent with grade 1 diastolic dysfunction. BNP 128, no JVD. Has only trace amount of leg edema. Sedative is compensated. -Hold torsemide as above -Continue metoprolol  Open wound of right foot great toe and knee: -consult to WC.  DVT ppx: Eliquis Code Status: Full code Family Communication:  Yes, patient's sister and niece at  bed  side Disposition Plan:  Anticipate discharge back to previous home environment Consults called:  none Admission status: Obs / tele  Date of Service 02/24/2016    Ivor Costa [facility] Pager [phone]  If 7PM-7AM, please contact night-coverage [email or web address] Password [facility] 02/24/2016, 12:22 AM

## 2016-02-23 NOTE — Telephone Encounter (Signed)
Filled out and placed order for new diabetic shoes in outgoing fax pile at front desk.  Deniece Ree PT, DPT [phone]

## 2016-02-23 NOTE — Therapy (Signed)
[place], [name], [address] Phone: [phone]   Fax:  [phone]  Wound Care Evaluation  Patient Details  Name: [name] MRN: [id number] Date of Birth: Aug 18, 1945 No Data Recorded  Encounter Date: 02/23/2016      PT End of Session - 02/23/16 1713    Visit Number 1   Number of Visits 8   Date for PT Re-Evaluation 03/22/16   Authorization Type [employer]    Authorization Time Period 02/23/16 to 03/25/16   Authorization - Visit Number 1   Authorization - Number of Visits 10   PT Start Time [id number]   PT Stop Time 1555   PT Time Calculation (min) 38 min   Activity Tolerance Patient tolerated treatment well   Behavior During Therapy [facility] for tasks assessed/performed      Past Medical History  Diagnosis Date  . Hypertension   . Diabetes mellitus     type 2   . History of valve replacement 2010    [facility] Ease bioprosthetic 19mm  . Thyroid disease   . Stroke [facility]) 2003    R-sided weakness & upper extremity swelling   . Coronary artery disease   . Dyslipidemia   . At risk for sudden cardiac death Jun 30, 2013  . PAF (paroxysmal atrial fibrillation) ([place]) 05/2013    converted with amiodarone to SR  . Chronic anticoagulation 05/2013    started  . S/P cardiac catheterization, Rt & Lt heart cath 06/05/2013 06-30-2013  . S/P CABG x 3 2010  . GERD (gastroesophageal reflux disease)   . Cataract   . Chronic kidney disease     Patient reports he is seeing Kidney doctor in September    Past Surgical History  Procedure Laterality Date  . Coronary artery bypass graft  01/26/2009    LIMA to LAD, SVG to circumflex, SVG to PDA  . Aortic valve replacement  01/26/2009    [facility] Ease bioprosthetic 61mm valve  . Transthoracic echocardiogram  09/2011    grade 1 diastolic dysfunction; increasing valve gradient; calcified MV annulus   . Cardiac catheterization  06/05/2013    native 3 vessel disease;  patent LIMA to LAD, SVG to OM and SVG to PDA; Elevated right and left heart filling pressures, with predominantly pulmonary venous hypertension, normal PVR  . Transthoracic echocardiogram  06/03/2013    EF 25-30%, mod conc. hypertrophy, grade 3 diastolic dysfunction; LA mod dilated; calcified MV annulus; transaortic gradients are normal for the bioprosthetic valve; inf vena cava dilated (elevated CVP) - LifeVest  . Left and right heart catheterization with coronary angiogram N/A 06/05/2013    Procedure: LEFT AND RIGHT HEART CATHETERIZATION WITH CORONARY ANGIOGRAM;  Surgeon: Leonie Man, MD;  Location: [facility] CATH LAB;  Service: Cardiovascular;  Laterality: N/A;  . Graft(s) angiogram  06/05/2013    Procedure: GRAFT(S) Cyril Loosen;  Surgeon: Leonie Man, MD;  Location: [facility] CATH LAB;  Service: Cardiovascular;;  . Colonoscopy  2007    Dr. Aviva Signs: internal hemorrhoids    There were no vitals filed for this visit.         Wound Therapy - 02/23/16 1703    Subjective Patient was DC-ed from PT wound care after full wound resolution earlier this year; however, approximately one week ago he noticed that he had a blister on his great toe, he believes from his shoes rubbing. He states his sugars have been up in the 270s and as low as  76, htey go back and forth. He has been cleaning the wound with soap and water on his own so far.    Patient and Family Stated Goals wound to heal    Pain Assessment No/denies pain   Evaluation and Treatment Procedures Explained to Patient/Family Yes   Evaluation and Treatment Procedures agreed to   Wound Properties Date First Assessed: 02/23/16 Wound Type: Other (Comment) , open area R great toe   Location: Other (Comment) , Medial R great toe   Location Orientation: Right;Medial Present on Admission: Yes   Dressing Type None   Dressing Changed New   Dressing Status None   Dressing Change Frequency PRN   % Wound base Red or Granulating 100%   % Wound base Yellow  0%   % Wound base Black 0%   Wound Length (cm) 1 cm   Wound Width (cm) 0.9 cm   Wound Depth (cm) --  just skin depth    Undermining (cm) --  approx 0.3cm distally, rest of wound has 0.1cm undermining    Margins Unattached edges (unapproximated)   Closure None   Drainage Amount None   Drainage Description No odor   Treatment Cleansed;Debridement (Selective);Packing (Impregnated strip);Tape changed;Other (Comment)  debridement dry skin/callous, xeroform/gauze/tape    Wound Properties Date First Assessed: 06/23/15 Time First Assessed: 0900 Wound Type: Other (Comment) Location: Toe (Comment  which one) Location Orientation: Right Wound Description (Comments): medial  aspect of Rt great toe Present on Admission: No       Selective Debridement - Location perimeter of wound   Selective Debridement - Tools Used Scalpel;Forceps   Selective Debridement - Tissue Removed dry skin, callous, addressed undermining    Wound Therapy - Clinical Statement patient appears with new wound that he believes is due to a blister from his shoes. Upon examination, wound appears in fairly good condition with  100% granulating tissue and no slough, however it does have quite a bit of undermining and dry skin/calllous surrounding the borders. Debrided dry skin/addressed undermining, and dressed wound with xeroform and gauze/medipore tape today. Will also plan to put in referral requeset for new diabetic shoes for patient. At this time patietn will benefit from skilled PT services to address his open wound and facilitate full wound healing.    Wound Therapy - Functional Problem List dificulty walking, dificulty wearing shoes    Factors Delaying/Impairing Wound Healing Altered sensation;Diabetes Mellitus;Infection - systemic/local;Immobility;Multiple medical problems;Vascular compromise   Hydrotherapy Plan Debridement;Dressing change;Patient/family education   Wound Therapy - Frequency Other (comment)  2x/week for 4 weeks     Wound Therapy - Current Recommendations PT   Wound Plan xeroform, debridement; may alter dressings as needed if wound does not heal as anticipated                          PT Education - 02/23/16 1712    Education provided Yes   Education Details prognosis, POC, back to bird baths to avoid getting infection in wound    Person(s) Educated Patient   Methods Explanation   Comprehension Verbalized understanding          PT Short Term Goals - 02/23/16 1714    PT SHORT TERM GOAL #1   Title Patient to demonstrate reduction of wound area by at least 50% on all measured dimensions in order to demonstrate improvement of condition    Time 2   Period Weeks   Status New   PT SHORT  TERM GOAL #2   Title Patient will verbally be able to verbally state at least 3 methods of foot care/ways to prevent further wound formation in order to prevent wound recurrence    Time 2   Period Weeks   Status New           PT Long Term Goals - 02/23/16 1715    PT LONG TERM GOAL #1   Title Patient to be able to wear all pairs of shoes without exacerbation of R great toe in order to improve QOL and recurrence of wound    Time 4   Period Weeks   Status New   PT LONG TERM GOAL #2   Title Patient to display full wound closure in order to demonstrate resolution of condition    Time 4   Period Weeks   Status New            Patient will benefit from skilled therapeutic intervention in order to improve the following deficits and impairments:     Visit Diagnosis: Unspecified superficial injury of right great toe, sequela - Plan: PT plan of care cert/re-cert  Difficulty in walking, not elsewhere classified - Plan: PT plan of care cert/re-cert      G-Codes - [id number]    Functional Assessment Tool Used based on skilled clinical assessmnet of wound status   Functional Limitation Other PT primary   Other PT Primary Current Status [id number]) At least 40 percent but less than 60  percent impaired, limited or restricted   Other PT Primary Goal Status [id number]) At least 20 percent but less than 40 percent impaired, limited or restricted      Problem List Patient Active Problem List   Diagnosis Date Noted  . Rectal bleeding 07/28/2015  . Chronic anticoagulation 07/28/2015  . PVD (peripheral vascular disease) ([place])   . S/P angioplasty 04/23/2015  . Peripheral arterial occlusive disease ([place]) 04/23/2015  . PAD (peripheral artery disease) ([place])   . Critical lower limb ischemia   . Toe ulcer, right ([place])   . Type II or unspecified type diabetes mellitus without mention of complication, uncontrolled 11/27/2013  . Acute respiratory failure with hypoxia ([place]) 11/26/2013  . CAP (community acquired pneumonia) 11/26/2013  . CHF exacerbation ([place]) 11/25/2013  . Cardiomyopathy, ischemic 06/13/2013  . At risk for sudden cardiac death 06-18-13  . S/P cardiac catheterization, Rt & Lt heart cath 06/05/2013 06/18/13  . Acute on chronic combined systolic and diastolic HF (heart failure), NYHA class 3 ([place]) 06/04/2013  . Atrial flutter with rapid ventricular response ([place]) 06/02/2013  . Acute renal failure: Cr up to 1.8 06/02/2013  . Hyperkalemia 06/02/2013    Class: Acute  . LBBB (left bundle branch block) 06/01/2013  . Hypotension- secondary to AF and Diltiazem Rx 06/01/2013  . Obesity (BMI 30-39.9) 06/01/2013  . Acute on chronic diastolic heart failure - due to Afib AVR 06/01/2013  . Atrial fibrillation with RVR- converted with Amiodarone 05/31/2013  . Stroke- Lt brain 2003 04/08/2013  . Hemiplegia affecting right dominant side ([place]) 04/08/2013  . Cataract 04/08/2013  . GERD (gastroesophageal reflux disease) 04/08/2013  . S/P CABG x 3 - 2010 04/08/2013  . S/P AVR-tissue, 2010 04/08/2013  . DM2 (diabetes mellitus, type 2) ([place]) 04/08/2013  . Dyslipidemia 04/08/2013  . SUBACROMIAL BURSITIS, LEFT 11/05/2009    Deniece Ree PT, DPT [facility] [address] [place], [name], [address] Phone: [phone]   Fax:  [phone]  Name: RAMEZ MARBERRY MRN: [id number] Date of Birth: 28-Sep-1945

## 2016-02-23 NOTE — ED Notes (Signed)
Patient transported to MRI 

## 2016-02-23 NOTE — ED Notes (Signed)
Pt's CBG result was 300. Informed Anderson Malta - RN.

## 2016-02-24 DIAGNOSIS — Z7901 Long term (current) use of anticoagulants: Secondary | ICD-10-CM

## 2016-02-24 DIAGNOSIS — R42 Dizziness and giddiness: Secondary | ICD-10-CM

## 2016-02-24 DIAGNOSIS — I480 Paroxysmal atrial fibrillation: Secondary | ICD-10-CM | POA: Diagnosis not present

## 2016-02-24 DIAGNOSIS — N183 Chronic kidney disease, stage 3 (moderate): Secondary | ICD-10-CM

## 2016-02-24 DIAGNOSIS — I5042 Chronic combined systolic (congestive) and diastolic (congestive) heart failure: Secondary | ICD-10-CM

## 2016-02-24 DIAGNOSIS — I739 Peripheral vascular disease, unspecified: Secondary | ICD-10-CM | POA: Diagnosis not present

## 2016-02-24 DIAGNOSIS — R112 Nausea with vomiting, unspecified: Secondary | ICD-10-CM

## 2016-02-24 DIAGNOSIS — I482 Chronic atrial fibrillation: Secondary | ICD-10-CM | POA: Diagnosis not present

## 2016-02-24 DIAGNOSIS — F419 Anxiety disorder, unspecified: Secondary | ICD-10-CM | POA: Diagnosis not present

## 2016-02-24 LAB — GLUCOSE, CAPILLARY
GLUCOSE-CAPILLARY: 297 mg/dL — AB (ref 65–99)
Glucose-Capillary: 319 mg/dL — ABNORMAL HIGH (ref 65–99)
Glucose-Capillary: 268 mg/dL — ABNORMAL HIGH (ref 65–99)

## 2016-02-24 LAB — LIPID PANEL
CHOL/HDL RATIO: 6.3 ratio
CHOLESTEROL: 182 mg/dL (ref 0–200)
HDL: 29 mg/dL — AB (ref >40)
LDL Cholesterol: 99 mg/dL (ref 0–99)
Triglycerides: 268 mg/dL — ABNORMAL HIGH (ref <150)
VLDL: 54 mg/dL — ABNORMAL HIGH (ref 0–40)

## 2016-02-24 LAB — HEMOGLOBIN A1C
HEMOGLOBIN A1C: 9 % — AB (ref 4.8–5.6)
MEAN PLASMA GLUCOSE: 212 mg/dL

## 2016-02-24 MED ORDER — INSULIN GLARGINE 100 UNIT/ML ~~LOC~~ SOLN
65.0000 [IU] | Freq: Every day | SUBCUTANEOUS | Status: DC
  Administered 2016-02-25: 65 [IU] via SUBCUTANEOUS
  Filled 2016-02-24: qty 0.65

## 2016-02-24 MED ORDER — INSULIN GLARGINE 100 UNIT/ML ~~LOC~~ SOLN
25.0000 [IU] | Freq: Every day | SUBCUTANEOUS | Status: DC
  Administered 2016-02-24: 25 [IU] via SUBCUTANEOUS
  Filled 2016-02-24 (×2): qty 0.25

## 2016-02-24 MED ORDER — RENA-VITE PO TABS
ORAL_TABLET | ORAL | Status: AC
  Filled 2016-02-24: qty 1

## 2016-02-24 MED ORDER — INSULIN GLARGINE 100 UNIT/ML ~~LOC~~ SOLN
35.0000 [IU] | Freq: Every day | SUBCUTANEOUS | Status: DC
  Administered 2016-02-24: 35 [IU] via SUBCUTANEOUS
  Filled 2016-02-24 (×2): qty 0.35

## 2016-02-24 MED ORDER — SILVER SULFADIAZINE 1 % EX CREA
1.0000 "application " | TOPICAL_CREAM | Freq: Every day | CUTANEOUS | Status: DC
  Administered 2016-02-24 – 2016-02-25 (×2): 1 via TOPICAL
  Filled 2016-02-24: qty 85

## 2016-02-24 MED ORDER — MECLIZINE HCL 25 MG PO TABS
25.0000 mg | ORAL_TABLET | Freq: Three times a day (TID) | ORAL | Status: DC | PRN
  Filled 2016-02-24: qty 1

## 2016-02-24 MED ORDER — INSULIN ASPART 100 UNIT/ML ~~LOC~~ SOLN
0.0000 [IU] | Freq: Three times a day (TID) | SUBCUTANEOUS | Status: DC
  Administered 2016-02-25: 11 [IU] via SUBCUTANEOUS
  Administered 2016-02-25 (×2): 8 [IU] via SUBCUTANEOUS

## 2016-02-24 MED ORDER — INSULIN ASPART 100 UNIT/ML ~~LOC~~ SOLN
0.0000 [IU] | Freq: Every day | SUBCUTANEOUS | Status: DC
  Administered 2016-02-24: 3 [IU] via SUBCUTANEOUS

## 2016-02-24 MED ORDER — INSULIN GLARGINE 100 UNIT/ML ~~LOC~~ SOLN
55.0000 [IU] | Freq: Every day | SUBCUTANEOUS | Status: DC
  Administered 2016-02-24: 55 [IU] via SUBCUTANEOUS
  Filled 2016-02-24: qty 0.55

## 2016-02-24 NOTE — Evaluation (Signed)
Occupational Therapy Evaluation Patient Details Name: [name] MRN: [id number] DOB: 1945-09-18 Today's Date: 02/24/2016    History of Present Illness [name] is a 70 y.o. male with medical history significant of hypertension, hyperlipidemia, diabetes mellitus, GERD, anxiety, CAD, s/p of CABG, hypothyroidism, stroke with right-sided paralysis, PAF on Eliquis, CKD-III, chronically combined systolic and diastolic CHF, who presents with nausea, vomiting, dizziness.   Clinical Impression   Pt reports he was independent with ADLs PTA. Currently pt overall min guard for safety with ADLs and functional mobility. Pt reports no dizziness or nausea at this time. Pt with mild residual R sided weakness from prior CVA; pt reports he cannot use R arm anymore however he was noted to use R UE multiple times during functional activities in session. Pt planning to d/c home alone. Recommending HHOT for follow up in order to maximize pts independence and safety with ADLs and functional mobility upon return home. Pt would benefit from continued skilled OT to address established goals.    Follow Up Recommendations  Home health OT;Supervision/Assistance - 24 hour    Equipment Recommendations  None recommended by OT    Recommendations for Other Services       Precautions / Restrictions Precautions Precautions: Fall Precaution Comments: R residual weakness from previous stroke Restrictions Weight Bearing Restrictions: No      Mobility Bed Mobility               General bed mobility comments: Pt OOB in chair upon arrival  Transfers Overall transfer level: Needs assistance Equipment used: Rolling walker (2 wheeled) Transfers: Sit to/from Stand Sit to Stand: Supervision         General transfer comment: Supervision for safety with sit to stand x 3 from chair. Good hand placement and technique with use of RW.    Balance Overall balance assessment: Needs  assistance Sitting-balance support: Feet supported;No upper extremity supported Sitting balance-Leahy Scale: Good     Standing balance support: No upper extremity supported;During functional activity Standing balance-Leahy Scale: Fair Standing balance comment: Able to use urinal in standing without UE support                            ADL Overall ADL's : Needs assistance/impaired Eating/Feeding: Set up;Sitting   Grooming: Min guard;Standing   Upper Body Bathing: Supervision/ safety;Sitting   Lower Body Bathing: Supervison/ safety;Sit to/from stand   Upper Body Dressing : Supervision/safety;Sitting   Lower Body Dressing: Supervision/safety;Sit to/from stand   Toilet Transfer: Min guard;Ambulation;Regular Toilet;RW Toilet Transfer Details (indicate cue type and reason): Simulated by sit to stand from chair. Pt able to use urinal in standing without unsteadiness or LOB. Toileting- Clothing Manipulation and Hygiene: Supervision/safety;Sit to/from stand       Functional mobility during ADLs: Min guard;Rolling walker General ADL Comments: Pt reports he can no longer use RUE from previous stroke, however, pt observed to use RUE functionally multiple times (to pull lever on chair to raise/lower legs, etc)     Vision Additional Comments: Appears WFL.   Perception     Praxis      Pertinent Vitals/Pain Pain Assessment: No/denies pain     Hand Dominance Left   Extremity/Trunk Assessment Upper Extremity Assessment Upper Extremity Assessment: RUE deficits/detail RUE Deficits / Details: grossly 4/5. residual weakness from previous stroke. Able to participate in functional activities with use of RUE.   Lower Extremity Assessment Lower Extremity Assessment: Defer to PT evaluation  Cervical / Trunk Assessment Cervical / Trunk Assessment: Kyphotic   Communication Communication Communication: Expressive difficulties (garbled speech)   Cognition Arousal/Alertness:  Awake/alert Behavior During Therapy: WFL for tasks assessed/performed Overall Cognitive Status: Impaired/Different from baseline Area of Impairment: Awareness;Safety/judgement         Safety/Judgement: Decreased awareness of safety Awareness: Emergent   General Comments: pt illiterate   General Comments       Exercises       Shoulder Instructions      Home Living Family/patient expects to be discharged to:: Private residence Living Arrangements: Alone Available Help at Discharge: Family;Available PRN/intermittently Type of Home: Mobile home Home Access: Ramped entrance     Home Layout: One level     Bathroom Shower/Tub: Tub/shower unit Shower/tub characteristics: Curtain Biochemist, clinical: Standard     Home Equipment: Cane - single point;Walker - 2 wheels;Tub bench          Prior Functioning/Environment Level of Independence: Independent with assistive device(s)        Comments: uses cane and RW occasionally, pt reports he is still driving however pt reports getting really dizzy last time. Uses tub bench for bathing.    OT Diagnosis: Generalized weakness   OT Problem List: Decreased strength;Decreased range of motion;Impaired balance (sitting and/or standing);Decreased coordination;Decreased safety awareness;Decreased cognition;Decreased knowledge of use of DME or AE;Obesity;Impaired UE functional use   OT Treatment/Interventions: Self-care/ADL training;Therapeutic exercise;Energy conservation;DME and/or AE instruction;Therapeutic activities;Patient/family education;Balance training    OT Goals(Current goals can be found in the care plan section) Acute Rehab OT Goals Patient Stated Goal: get back home OT Goal Formulation: With patient Time For Goal Achievement: 03/09/16 Potential to Achieve Goals: Good ADL Goals Pt Will Perform Grooming: with modified independence;standing Pt Will Transfer to Toilet: with modified independence;ambulating;regular height  toilet Pt Will Perform Toileting - Clothing Manipulation and hygiene: with modified independence;sit to/from stand Pt Will Perform Tub/Shower Transfer: Tub transfer;with modified independence;ambulating;tub bench;rolling walker Pt/caregiver will Perform Home Exercise Program: Increased strength;Right Upper extremity;With theraband;With theraputty;Independently;With written HEP provided  OT Frequency: Min 2X/week   Barriers to D/C: Decreased caregiver support  lives alone       Co-evaluation              End of Session Equipment Utilized During Treatment: Gait belt;Rolling walker  Activity Tolerance: Patient tolerated treatment well Patient left: in chair;with call bell/phone within reach;with chair alarm set   Time: 1555-1610 OT Time Calculation (min): 15 min Charges:  OT General Charges $OT Visit: 1 Procedure OT Evaluation $OT Eval Moderate Complexity: 1 Procedure G-Codes: OT G-codes **NOT FOR INPATIENT CLASS** Functional Assessment Tool Used: Clinical judgement Functional Limitation: Self care Self Care Current Status [id number]): At least 1 percent but less than 20 percent impaired, limited or restricted Self Care Goal Status [id number]): At least 1 percent but less than 20 percent impaired, limited or restricted   Binnie Kand M.S., OTR/L Pager: [phone]  02/24/2016, 4:46 PM

## 2016-02-24 NOTE — Progress Notes (Signed)
PROGRESS NOTE  [name]  [id number] DOB: 08/01/1946  DOA: 02/23/2016 PCP: [name]., MD   Brief Narrative:  70 y.o. male with medical history significant of hypertension, hyperlipidemia, diabetes mellitus, GERD, anxiety, CAD, s/p of CABG, hypothyroidism, stroke with right-sided paralysis, PAF on Eliquis, CKD-III, chronically combined systolic and diastolic CHF, who presents with nausea, vomiting, dizziness. He states that his symptoms started an hour after consuming a couple of hot dogs. Family members did not have similar symptoms. CT and MRI brain were without acute findings. Admitted for further evaluation and management.   Assessment & Plan:   Principal Problem:   Dizziness Active Problems:   Stroke- Lt brain 2003   GERD (gastroesophageal reflux disease)   S/P CABG x 3 - 2010   S/P AVR-tissue, 2010   DM2 (diabetes mellitus, type 2) (HCC)   Dyslipidemia   LBBB (left bundle branch block)   Obesity (BMI 30-39.9)   Chronic anticoagulation   A-fib (HCC)   Hypothyroidism   CKD (chronic kidney disease), stage III   Anxiety   Chronic combined systolic (congestive) and diastolic (congestive) heart failure (HCC)   Open wound of right foot   Nausea & vomiting   Nausea, vomiting and dizziness - ? Acute GE versus food poisoning. - CT and MRI brain without acute findings. - Orthostatics negative. - Telemetry without arrhythmia's. - Diuretics temporarily held. Hydrated with IV fluids. Clinically improved. Tolerating diet. PT and OT have evaluated and recommended home health PT and OT.  History of stroke with residual right hemiparesis - No signs and symptoms suggestive of acute stroke and CT and MRI brain without acute findings. - Continue atorvastatin, TriCor and Eliquis  CAD:  - s/p of CABG. No CP. Pt is not taking plavix - continue Lipitor, TriCor, metoprolol  DM-II:  - Last A1c 8.9, poorly controled. Patient is taking Lantus and Humalog at home -will  decrease Lantus dose from 75 units in morning and 35 units in evening--> 55 units in morning and 25 units in evening.  -SSI-moderate - Uncontrolled with CBGs ranging between 268-319. Titrate Lantus back to home dose.  Dyslipidemia: -continue Lipitor and TriCor  Atrial Fibrillation:  - CHA2DS2-VASc Score is 7, needs oral anticoagulation. Patient is on Eliquis at home. Heart rate is well controlled. -continue Eliquis -continue metoprolol and amiodarone  Hypothyroidism:  - Last TSH was 1.152 on 06/29/15 -Continue home Synthroid  CKD-III:  - stable. Baseline creatinine 1.3-1.5. His creatinine is 1.30, BUN 16. -Follow-up renal function by BMP.   Anxiety: -prn Xanax  Chronic combined systolic and diastolic congestive heart failure ([place]):  - 2-D echo on 09/09/13 showed EF45-50 percent with grade 1 diastolic dysfunction. BNP 128, no JVD. Has only trace amount of leg edema. compensated. -Hold torsemide as above >consider resuming diuretics 7/13. -Continue metoprolol  Open wound of right foot great toe and knee: -consult to [place] appreciated.  Mild thrombocytopenia, chronic - Follow CBC in a.m.   DVT prophylaxis: On Eliquis Code Status: Full Family Communication: Discussed with patient. No family at bedside. Disposition Plan: DC home possibly 7/13   Consultants:   None  Procedures:   None  Antimicrobials:   None    Subjective: Feels much better. No further nausea or vomiting since admission. Tolerated diet. Occasional dizziness. No vertigo. No chest pain, palpitations, dyspnea.  Objective:  Filed Vitals:   02/24/16 0900 02/24/16 1100 02/24/16 1310 02/24/16 1700  BP: 156/53 179/69 144/62 155/60  Pulse: 64 67 62 58  Temp: 97.9 F (  36.6 C) 97.7 F (36.5 C) 97.8 F (36.6 C) 98.1 F (36.7 C)  TempSrc:   Oral Oral  Resp: 18 16 16 16   Height:      Weight:      SpO2: 98% 93% 95% 96%    Intake/Output Summary (Last 24 hours) at 02/24/16 1727 Last data filed  at 02/24/16 1223  Gross per 24 hour  Intake    240 ml  Output   1800 ml  Net  -1560 ml   Filed Weights   02/23/16 1944  Weight: 117.028 kg (258 lb)    Examination:  General exam: Pleasant middle-aged male sitting up comfortably in bed this morning. Respiratory system: Clear to auscultation. Respiratory effort normal. Cardiovascular system: S1 & S2 heard, RRR. No JVD, murmurs, rubs, gallops or clicks. No pedal edema. Telemetry: Sinus rhythm with first-degree AV block/BBB morphology. Gastrointestinal system: Abdomen is nondistended, soft and nontender. No organomegaly or masses felt. Normal bowel sounds heard. Central nervous system: Alert and oriented. No focal neurological deficits. Extremities: Symmetric 5 x 5 power. Approximately 0.75 cm diameter superficial clean ulcer over medial aspect of right big toe without acute findings. Skin: No rashes, lesions or ulcers Psychiatry: Judgement and insight appear normal. Mood & affect appropriate.     Data Reviewed: I have personally reviewed following labs and imaging studies  CBC:  Recent Labs Lab 02/23/16 1956 02/23/16 2026  WBC 6.3  --   NEUTROABS 4.1  --   HGB 13.2 13.3  HCT 39.6 39.0  MCV 88.0  --   PLT 134*  --    Basic Metabolic Panel:  Recent Labs Lab 02/23/16 1956 02/23/16 2026  NA 137 141  K 4.2 4.2  CL 105 103  CO2 24  --   GLUCOSE 310* 315*  BUN 16 18  CREATININE 1.30* 1.20  CALCIUM 8.8*  --    GFR: Estimated Creatinine Clearance: 77.1 mL/min (by C-G formula based on Cr of 1.2). Liver Function Tests:  Recent Labs Lab 02/23/16 1956  AST 52*  ALT 62  ALKPHOS 43  BILITOT 0.8  PROT 6.2*  ALBUMIN 4.0    Recent Labs Lab 02/23/16 2238  LIPASE 36   No results for input(s): AMMONIA in the last 168 hours. Coagulation Profile:  Recent Labs Lab 02/23/16 1956  INR 1.20   Cardiac Enzymes: No results for input(s): CKTOTAL, CKMB, CKMBINDEX, TROPONINI in the last 168 hours. BNP (last 3  results) No results for input(s): PROBNP in the last 8760 hours. HbA1C: No results for input(s): HGBA1C in the last 72 hours. CBG:  Recent Labs Lab 02/23/16 1959 02/24/16 0614 02/24/16 1126  GLUCAP 300* 319* 268*   Lipid Profile:  Recent Labs  02/24/16 0526  CHOL 182  HDL 29*  LDLCALC 99  TRIG 268*  CHOLHDL 6.3   Thyroid Function Tests: No results for input(s): TSH, T4TOTAL, FREET4, T3FREE, THYROIDAB in the last 72 hours. Anemia Panel: No results for input(s): VITAMINB12, FOLATE, FERRITIN, TIBC, IRON, RETICCTPCT in the last 72 hours.  Sepsis Labs: No results for input(s): PROCALCITON, LATICACIDVEN in the last 168 hours.  No results found for this or any previous visit (from the past 240 hour(s)).       Radiology Studies: Mr Brain Wo Contrast  02/24/2016  CLINICAL DATA:  Weakness, vomiting and dizziness. History of hypertension, diabetes, stroke. EXAM: MRI HEAD WITHOUT CONTRAST TECHNIQUE: Multiplanar, multiecho pulse sequences of the brain and surrounding structures were obtained without intravenous contrast. COMPARISON:  CT HEAD February 23, 2016 at 2030 hours and MRI head June 27, 2012 FINDINGS: INTRACRANIAL CONTENTS: No reduced diffusion to suggest acute ischemia. No susceptibility artifact to suggest hemorrhage. The ventricles and sulci are normal for patient's age. Stable disproportionate moderate cerebellar and brainstem atrophy. LEFT para median pontine infarct. Old small bilateral cerebellar infarcts are new from 2013. Old RIGHT caudate head lacunar infarct. No suspicious parenchymal signal, masses or mass effect. Patchy supratentorial white matter FLAIR T2 hyperintensities advanced from prior MRI. No abnormal extra-axial fluid collections. No extra-axial masses though, contrast enhanced sequences would be more sensitive. Normal major intracranial vascular flow voids present at skull base. ORBITS: The included ocular globes and orbital contents are non-suspicious.  Status post bilateral ocular lens implants. SINUSES: The mastoid air-cells and included paranasal sinuses are well-aerated. Persistent low T1, bright T2 signal RIGHT petrous apex most compatible with fluid. SKULL/SOFT TISSUES: No abnormal sellar expansion. No suspicious calvarial bone marrow signal. Craniocervical junction maintained. Patient is edentulous. IMPRESSION: No acute intracranial process. Stable disproportionate cerebellar and brainstem atrophy for age. Old cerebellar, pontine and RIGHT caudate infarcts. Moderate chronic small vessel ischemic disease. Electronically Signed   By: Elon Alas M.D.   On: 02/24/2016 00:01   Dg Chest Port 1 View  02/23/2016  CLINICAL DATA:  Weakness and shortness of breath for 1 day EXAM: PORTABLE CHEST 1 VIEW COMPARISON:  None. FINDINGS: Cardiac shadow is mildly enlarged. Postsurgical changes are noted. Mild vascular prominence is seen no focal edema is noted. Minimal atelectatic changes are noted in the right mid lung. No bony abnormality is seen. IMPRESSION: Mild vascular congestion without pulmonary edema. Electronically Signed   By: Inez Catalina M.D.   On: 02/23/2016 20:39   Ct Head Code Stroke W/o Cm  02/23/2016  CLINICAL DATA:  Dizziness EXAM: CT HEAD WITHOUT CONTRAST TECHNIQUE: Contiguous axial images were obtained from the base of the skull through the vertex without intravenous contrast. COMPARISON:  CT temporal bone 07/03/2012, MRI head 06/26/2012 FINDINGS: Moderate atrophy. Chronic microvascular ischemic change in the white matter. Negative for acute infarct.  Negative for acute hemorrhage or mass. Extensive vascular calcification 1 cm lesion right Peters apex unchanged from prior studies. There is loss of normal trabecular pattern and this is mildly expansile without aggressive destructive features. IMPRESSION: Atrophy and chronic ischemic change. 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 These results were called by telephone at the time of interpretation on 02/23/2016 at 8:41 pm to Dr. Canary Brim, who verbally acknowledged these results. Electronically Signed   By: Franchot Gallo M.D.   On: 02/23/2016 20:40        Scheduled Meds: . amiodarone  200 mg Oral Daily  . apixaban  5 mg Oral BID  . atorvastatin  20 mg Oral q1800  . docusate sodium  100 mg Oral BID  . fenofibrate  160 mg Oral Daily  . ferrous sulfate  325 mg Oral Q breakfast  . gabapentin  300 mg Oral BID  . HYDROcodone-acetaminophen  0.5 tablet Oral BID  . insulin aspart  0-15 Units Subcutaneous TID WC  . insulin glargine  25 Units Subcutaneous QHS  . insulin glargine  55 Units Subcutaneous Daily  . levothyroxine  25 mcg Oral QAC breakfast  . metoprolol succinate  25 mg Oral Daily  . omega-3 acid ethyl esters  1 g Oral Daily  . pantoprazole  40 mg Oral BID  . silver sulfADIAZINE  1 application Topical Daily  Continuous Infusions:       Time spent: 30 minutes.    [facility], MD [facility] Pager [phone]  If 7PM-7AM, please contact night-coverage [email or web address] Password [facility] 02/24/2016, 5:27 PM

## 2016-02-24 NOTE — Care Management Obs Status (Signed)
[facility] NOTIFICATION   Patient Details  Name: AJENE KRIEL MRN: [id number] Date of Birth: Mar 27, 1946   Medicare Observation Status Notification Given:  Yes (MRI negative)    Pollie Friar, RN 02/24/2016, 3:04 PM

## 2016-02-24 NOTE — Evaluation (Addendum)
Physical Therapy Evaluation Patient Details Name: [name] MRN: [id number] DOB: 12-13-1945 Today's Date: 02/24/2016   History of Present Illness  [name] is a 70 y.o. male with medical history significant of hypertension, hyperlipidemia, diabetes mellitus, GERD, anxiety, CAD, s/p of CABG, hypothyroidism, stroke with right-sided paralysis, PAF on Eliquis, CKD-III, chronically combined systolic and diastolic CHF, who presents with nausea, vomiting, dizziness.  Clinical Impression  Vestibular assessment completed, see general comment section. Pt with no complaint of dizziness t/o session, just nausea. Constant, no change with any position. Pt with noted difficulty maintain focus during horizontal head turns and with head thrust to L and R however no dizziness noted. I do not suspect BPPV. Pt functioning near baseline.    Follow Up Recommendations Home health PT;Supervision/Assistance - 24 hour    Equipment Recommendations  None recommended by PT (pt reports having RW)    Recommendations for Other Services       Precautions / Restrictions Precautions Precautions: Fall Precaution Comments: R residual weakness from previous stroke Restrictions Weight Bearing Restrictions: No      Mobility  Bed Mobility Overal bed mobility: Needs Assistance Bed Mobility: Supine to Sit;Sit to Supine     Supine to sit: Modified independent (Device/Increase time) Sit to supine: Modified independent (Device/Increase time)   General bed mobility comments: pt does better getting out on R side of bed, labored effort due to minimal use of R UE but able to complete without physical assist  Transfers Overall transfer level: Needs assistance Equipment used: None Transfers: Sit to/from Stand Sit to Stand: Supervision         General transfer comment: pt with R knee hyperextension, pushed up with L UE  Ambulation/Gait Ambulation/Gait assistance: Min guard Ambulation Distance (Feet):  200 Feet Assistive device: Rolling walker (2 wheeled);None Gait Pattern/deviations: Step-through pattern;Decreased step length - right Gait velocity: decreased Gait velocity interpretation: Below normal speed for age/gender General Gait Details: pt initially amb without AD however demo'd R knee hyperextension and would occasionally reach for handrail. Pt given RW s/p 34' and pt with improved stability and fluidity of gait pattern. pt agrees with feeling more steady with RW  Stairs            Wheelchair Mobility    Modified Rankin (Stroke Patients Only) Modified Rankin (Stroke Patients Only) Pre-Morbid Rankin Score: Moderately severe disability Modified Rankin: Moderately severe disability     Balance Overall balance assessment: Needs assistance Sitting-balance support: Feet supported;No upper extremity supported Sitting balance-Leahy Scale: Good     Standing balance support: No upper extremity supported Standing balance-Leahy Scale: Fair Standing balance comment: pt stood x 1 min to take BP without LOB                             Pertinent Vitals/Pain Pain Assessment: No/denies pain    Home Living Family/patient expects to be discharged to:: Private residence Living Arrangements: Alone Available Help at Discharge: Family;Available PRN/intermittently Type of Home: Mobile home Home Access: Ramped entrance     Home Layout: One level Home Equipment: Cane - single point;Walker - 2 wheels      Prior Function Level of Independence: Independent with assistive device(s)         Comments: uses cane and RW occasionally, pt reports he is still driving however pt reports getting really dizzy last time     Hand Dominance   Dominant Hand: Left    Extremity/Trunk Assessment  Upper Extremity Assessment: RUE deficits/detail RUE Deficits / Details: grossly 3-/5 (residual from previous stroke)         Lower Extremity Assessment: RLE deficits/detail RLE  Deficits / Details: grossly 3/5 (residual weakness)    Cervical / Trunk Assessment: Kyphotic  Communication   Communication: Expressive difficulties (garbled speech)  Cognition Arousal/Alertness: Awake/alert Behavior During Therapy: WFL for tasks assessed/performed Overall Cognitive Status: Impaired/Different from baseline Area of Impairment: Awareness;Safety/judgement         Safety/Judgement: Decreased awareness of safety Awareness: Emergent   General Comments: pt illiterate    General Comments General comments (skin integrity, edema, etc.): Vestibular Asseessment: pt with no report of dizziness or nystagmus with any position. pt unable to track toi the L and during horizontal head turns had difficulty staying focused. but denies dizziness just constant nausea    Exercises        Assessment/Plan    PT Assessment Patient needs continued PT services  PT Diagnosis Difficulty walking;Generalized weakness   PT Problem List Decreased strength;Decreased activity tolerance;Decreased balance;Decreased mobility;Decreased coordination;Decreased cognition;Decreased safety awareness (con't vesitbular assessment)  PT Treatment Interventions DME instruction;Gait training;Stair training;Functional mobility training;Therapeutic activities;Therapeutic exercise;Balance training   PT Goals (Current goals can be found in the Care Plan section) Acute Rehab PT Goals Patient Stated Goal: stop the nausea PT Goal Formulation: With patient Time For Goal Achievement: 03/02/16 Potential to Achieve Goals: Good    Frequency Min 4X/week   Barriers to discharge Decreased caregiver support lives alone    Co-evaluation               End of Session Equipment Utilized During Treatment: Gait belt Activity Tolerance: Patient tolerated treatment well Patient left: in chair;with call bell/phone within reach;with chair alarm set Nurse Communication: Mobility status    Functional Assessment Tool  Used: clinical judgement Functional Limitation: Mobility: Walking and moving around Mobility: Walking and Moving Around Current Status [phone]): At least 20 percent but less than 40 percent impaired, limited or restricted Mobility: Walking and Moving Around Goal Status [phone]): At least 1 percent but less than 20 percent impaired, limited or restricted    Time: 1117-1200 PT Time Calculation (min) (ACUTE ONLY): 43 min   Charges:   PT Evaluation $PT Eval High Complexity: 1 Procedure PT Treatments $Gait Training: 8-22 mins $Therapeutic Activity: 8-22 mins   PT G Codes:   PT G-Codes **NOT FOR INPATIENT CLASS** Functional Assessment Tool Used: clinical judgement Functional Limitation: Mobility: Walking and moving around Mobility: Walking and Moving Around Current Status [id number]): At least 20 percent but less than 40 percent impaired, limited or restricted Mobility: Walking and Moving Around Goal Status [phone]): At least 1 percent but less than 20 percent impaired, limited or restricted    Kingsley Callander 02/24/2016, 2:02 PM   Kittie Plater, PT, DPT Pager #: [phone] Office #: [phone]

## 2016-02-24 NOTE — ED Notes (Addendum)
Pt back from MRI, EKG will be done and pt will be taken to floor.  Informed pt/family of bed assignment

## 2016-02-24 NOTE — Consult Note (Signed)
WOC wound consult note Reason for Consult: Consult requested for right toe and right knee.  Pt states the toe wound is chronic and he has been using Silvadene prior to admission, which has already been ordered.  Right knee is an abrasion from a previous fall. Wound type: Inner aspect of the right great toe with full thickness wound; 1X.9X.1cm, 100% red and dry, no odor or drainage.   Right knee with partial thickness abrasion; previous brown dry scab removed easily, revealing 100% red moist wound bed, .5X.5X.1cm. No odor or drainage.  Dressing procedure/placement/frequency: Foam dressing to protect and promote healing to right knee.  Continue present plan of care with Silvadene to right inner toe and patient can follow-up with his physician after discharge.   Please re-consult if further assistance is needed.  Thank-you,  Julien Girt MSN, [name], [place], [place], [place]

## 2016-02-25 ENCOUNTER — Ambulatory Visit (HOSPITAL_COMMUNITY): Payer: Medicare Other | Admitting: Physical Therapy

## 2016-02-25 DIAGNOSIS — Z7901 Long term (current) use of anticoagulants: Secondary | ICD-10-CM | POA: Diagnosis not present

## 2016-02-25 DIAGNOSIS — F419 Anxiety disorder, unspecified: Secondary | ICD-10-CM | POA: Diagnosis not present

## 2016-02-25 DIAGNOSIS — Z794 Long term (current) use of insulin: Secondary | ICD-10-CM

## 2016-02-25 DIAGNOSIS — I480 Paroxysmal atrial fibrillation: Secondary | ICD-10-CM | POA: Diagnosis not present

## 2016-02-25 DIAGNOSIS — I447 Left bundle-branch block, unspecified: Secondary | ICD-10-CM

## 2016-02-25 DIAGNOSIS — R42 Dizziness and giddiness: Secondary | ICD-10-CM | POA: Diagnosis not present

## 2016-02-25 DIAGNOSIS — E1165 Type 2 diabetes mellitus with hyperglycemia: Secondary | ICD-10-CM

## 2016-02-25 DIAGNOSIS — K219 Gastro-esophageal reflux disease without esophagitis: Secondary | ICD-10-CM

## 2016-02-25 DIAGNOSIS — E785 Hyperlipidemia, unspecified: Secondary | ICD-10-CM

## 2016-02-25 LAB — GLUCOSE, CAPILLARY
GLUCOSE-CAPILLARY: 310 mg/dL — AB (ref 65–99)
Glucose-Capillary: 303 mg/dL — ABNORMAL HIGH (ref 65–99)
Glucose-Capillary: 262 mg/dL — ABNORMAL HIGH (ref 65–99)
Glucose-Capillary: 276 mg/dL — ABNORMAL HIGH (ref 65–99)

## 2016-02-25 LAB — BASIC METABOLIC PANEL
Anion gap: 10 (ref 5–15)
BUN: 17 mg/dL (ref 6–20)
CO2: 26 mmol/L (ref 22–32)
Calcium: 9 mg/dL (ref 8.9–10.3)
Chloride: 99 mmol/L — ABNORMAL LOW (ref 101–111)
Creatinine, Ser: 1.27 mg/dL — ABNORMAL HIGH (ref 0.61–1.24)
GFR, EST NON AFRICAN AMERICAN: 57 mL/min — AB (ref >60)
Glucose, Bld: 255 mg/dL — ABNORMAL HIGH (ref 65–99)
POTASSIUM: 4.3 mmol/L (ref 3.5–5.1)
SODIUM: 135 mmol/L (ref 135–145)

## 2016-02-25 MED ORDER — INSULIN GLARGINE 100 UNIT/ML ~~LOC~~ SOLN: 60.0000 [IU] | Freq: Two times a day (BID) | SUBCUTANEOUS | Status: DC

## 2016-02-25 MED ORDER — INSULIN LISPRO 100 UNIT/ML ~~LOC~~ SOLN: 35.0000 [IU] | Freq: Three times a day (TID) | SUBCUTANEOUS | Status: DC

## 2016-02-25 NOTE — Progress Notes (Signed)
Patient is being d/c home. D/c instructions given and patient verbalized understanding. 

## 2016-02-25 NOTE — Progress Notes (Signed)
Called pharmacy to notify them to reconcile patient's medication as he is pending d/c

## 2016-02-25 NOTE — Progress Notes (Signed)
Physical Therapy Treatment Patient Details Name: YADIRA HEARL MRN: [id number] DOB: [birth year] Today's Date: 02/25/2016    History of Present Illness Myran I Pera is a 70 y.o. male with medical history significant of hypertension, hyperlipidemia, diabetes mellitus, GERD, anxiety, CAD, s/p of CABG, hypothyroidism, stroke with right-sided paralysis, PAF on Eliquis, CKD-III, chronically combined systolic and diastolic CHF, who presents with nausea, vomiting, dizziness.    PT Comments    Pt was seen sitting in chair talking on cell phone. He was impulsive to stand and did not wait to be asked to stand after hearing the plan for today's treatment. Pt has right sided weakness and has a significant R foot drop when walking and benefited from using the RW during ambulation. Pt was given an HEP for LE exercises to try to strengthen his R LE. Pt stated he has a similar HEP from previous therapy visits, but has slacked on doing them. Update goals next treatment. Pt would benefit from further PT to increase his functional ability.   Follow Up Recommendations  Home health PT;Supervision/Assistance - 24 hour     Equipment Recommendations  None recommended by PT    Recommendations for Other Services       Precautions / Restrictions Precautions Precautions: Fall Precaution Comments: R residual weakness from previous stroke Restrictions Weight Bearing Restrictions: No    Mobility  Bed Mobility                  Transfers Overall transfer level: Independent Equipment used: Rolling walker (2 wheeled) Transfers: Sit to/from Stand Sit to Stand: Independent         General transfer comment: Pt was able to stand by himself from the chair. Impulsive with standing up right after being told the plan of treatment for the day. Did not use RW to stand.  Ambulation/Gait Ambulation/Gait assistance: Modified independent (Device/Increase time) Ambulation Distance (Feet): 250  Feet Assistive device: Rolling walker (2 wheeled) Gait Pattern/deviations: Step-through pattern;Decreased step length - right;Decreased step length - left;Shuffle;Staggering left (foot drop on R) Gait velocity: decreased Gait velocity interpretation: Below normal speed for age/gender General Gait Details: Pt had a shuffling gait with R foot drop due to previous stroke. Pt slight stagger left, but not constant. Pt stated he has a walker and cane at home with a ramp leading into his home.    Stairs            Wheelchair Mobility    Modified Rankin (Stroke Patients Only) Modified Rankin (Stroke Patients Only) Pre-Morbid Rankin Score: Moderately severe disability Modified Rankin: Moderately severe disability     Balance Overall balance assessment: Needs assistance         Standing balance support: No upper extremity supported Standing balance-Leahy Scale: Fair Standing balance comment: Able to stand without LOB while therapist getting items ready for ambulation.                    Cognition Arousal/Alertness: Awake/alert Behavior During Therapy: WFL for tasks assessed/performed Overall Cognitive Status: Within Functional Limits for tasks assessed                 General Comments: Pt was cognitivly aware of what was happening during session. Pt making jokes during session.    Exercises      General Comments General comments (skin integrity, edema, etc.): Pt reported no dizziness or nasuea. Pt able to turn head while walking, but did veer towards the direction he was looking.  Pertinent Vitals/Pain Pain Assessment: No/denies pain    Home Living                      Prior Function            PT Goals (current goals can now be found in the care plan section) Acute Rehab PT Goals Patient Stated Goal: get back home    Frequency  Min 4X/week    PT Plan      Co-evaluation             End of Session Equipment Utilized During  Treatment: Gait belt Activity Tolerance: Patient tolerated treatment well Patient left: in chair;in CPM;with chair alarm set     Time: [id number] PT Time Calculation (min) (ACUTE ONLY): 26 min  Charges:                       G CodesNash Mantis, [place] [phone]  02/25/2016, 11:58 AM

## 2016-02-25 NOTE — Discharge Instructions (Signed)

## 2016-02-25 NOTE — Discharge Summary (Signed)
Physician Discharge Summary  [name] [id number] DOB: 1946/01/16  PCP: [name]., MD  Admit date: 02/23/2016 Discharge date: 02/25/2016  Admitted From: Home Disposition:  Home  Recommendations for Outpatient Follow-up:  1. Dr. Redmond School, PCP in 5 days. To be seen with repeat labs (CBC, CMP & TSH). Patient and family have been advised that patient to take all his medications for PCP to review and make adjustments as needed. Patient has not been taking Synthroid and amiodarone-to be decided during outpatient follow-up.  2. Dr. Lyman Bishop, Cardiology in 1 week.  Home Health: PT & OT Equipment/Devices: None    Discharge Condition: Improved and stable.  CODE STATUS: Full  Diet recommendation: Heart healthy and diabetic diet.  Discharge Diagnoses:  Principal Problem:   Dizziness Active Problems:   Stroke- Lt brain 2003   GERD (gastroesophageal reflux disease)   S/P CABG x 3 - 2010   S/P AVR-tissue, 2010   DM2 (diabetes mellitus, type 2) (HCC)   Dyslipidemia   LBBB (left bundle branch block)   Obesity (BMI 30-39.9)   Chronic anticoagulation   A-fib (HCC)   Hypothyroidism   CKD (chronic kidney disease), stage III   Anxiety   Chronic combined systolic (congestive) and diastolic (congestive) heart failure (HCC)   Open wound of right foot   Nausea & vomiting   Brief/Interim Summary: 70 y.o. male with medical history significant of CAD status post three-vessel CABG in 2010, bioprosthetic AVR 2010, GERD, dyslipidemia, DM 2/IDDM, CVA with right-sided hemiparesis, A. fib, chronic systolic CHF, LBBB, HTN, HLD, GERD, anxiety, hypothyroidism, CKD-III, who presented with nausea, vomiting, dizziness. He states that his symptoms started an hour after consuming a couple of hot dogs. Family members did not have similar symptoms. CT and MRI brain were without acute findings. Admitted for further evaluation and management.  Nausea, vomiting and dizziness - ? Acute GE  versus food poisoning. - CT and MRI brain without acute findings. - Orthostatics negative. - Telemetry without arrhythmia's. - Diuretics were temporarily held. Hydrated with IV fluids. Clinically improved. Tolerating diet. PT and OT have evaluated and recommended home health PT and OT. - Patient denies any further complaints and states that he has been back to his baseline since last night.  History of stroke with residual right hemiparesis - No signs and symptoms suggestive of acute stroke or TIA and CT and MRI brain without acute findings. - Continue atorvastatin, TriCor and Eliquis  CAD:  - s/p CABG. No CP. Pt is not taking plavix. Troponin 1: Negative. - continue Lipitor, TriCor, metoprolol  DM-II/IDDM:  - Last A1c 9, poorly controled. Patient is taking Lantus and Humalog at home - Discussed at length with patient. He states that he follows with PCP regarding DM management. Verified home insulin dosing and he stated as documented below. - Recommend close follow-up with PCP regarding adjustment of medications and may be endocrinology consultation. Seems to have insulin resistance.  Dyslipidemia: -continue Lipitor and TriCor  Paroxysmal Atrial Fibrillation:  - CHA2DS2-VASc Score is 7. Patient is on Eliquis at home. Patient in sinus rhythm/sinus bradycardia. -continue Eliquis -continue metoprolol. As per pharmacy review of his medications prior to discharge, patient has not taken amiodarone for > 30 days. Will leave him off of amiodarone. Recommend outpatient follow-up with cardiology.  Hypothyroidism:  - Last TSH was 1.152 on 06/29/15 - As per pharmacy review of his medications prior to discharge, patient has not taken Synthroid for >30 days. Will leave him off of amiodarone but  recommend checking TSH next week during PCP follow-up and decide regarding resuming Synthroid or not. Clinically appears euthyroid.  CKD-III:  - stable. Baseline creatinine 1.3-1.5.  -  Stable.  Anxiety: -prn Xanax  Chronic combined systolic congestive heart failure ([place]):  - 2-D echo on 09/09/13 showed EF45-50 percent with grade 1 diastolic dysfunction. BNP 128, no JVD. Has only trace amount of leg edema. compensated. -Continue metoprolol. Patient seems to take torsemide on a when necessary basis. - Outpatient follow-up with cardiology and consider repeating 2-D echo.  Open wound of right foot great toe and knee: -consult to [facility] appreciated. Outpatient follow-up with wound care center (patient states that he goes to wound care center).  Mild thrombocytopenia, chronic - Outpatient follow-up with repeat CBCs next week.  Medications review - There were several medications on his home list that he had not taken for >30 days. These were discontinued from his list. Patient and family have been counseled that he has to follow up with his PCP next week and take all his medications with him for review and they verbalized understanding.  Discharge Instructions      Discharge Instructions    (HEART FAILURE PATIENTS) Call MD:  Anytime you have any of the following symptoms: 1) 3 pound weight gain in 24 hours or 5 pounds in 1 week 2) shortness of breath, with or without a dry hacking cough 3) swelling in the hands, feet or stomach 4) if you have to sleep on extra pillows at night in order to breathe.    Complete by:  As directed      Call MD for:  difficulty breathing, headache or visual disturbances    Complete by:  As directed      Call MD for:  extreme fatigue    Complete by:  As directed      Call MD for:  persistant dizziness or light-headedness    Complete by:  As directed      Call MD for:  persistant nausea and vomiting    Complete by:  As directed      Diet - low sodium heart healthy    Complete by:  As directed      Diet Carb Modified    Complete by:  As directed      Increase activity slowly    Complete by:  As directed             Medication List     TAKE these medications        acetaminophen 500 MG tablet  Commonly known as:  TYLENOL  Take 1,000 mg by mouth 2 (two) times daily as needed for headache. For pain/headaches     ALPRAZolam 1 MG tablet  Commonly known as:  XANAX  Take 1 mg by mouth 4 (four) times daily as needed for anxiety.     atorvastatin 20 MG tablet  Commonly known as:  LIPITOR  Take 1 tablet (20 mg total) by mouth daily at 6 PM.     ELIQUIS 5 MG Tabs tablet  Generic drug:  apixaban  TAKE ONE TABLET TWICE DAILY     fenofibrate 145 MG tablet  Commonly known as:  TRICOR  TAKE ONE (1) TABLET BY MOUTH EVERY DAY     ferrous sulfate 325 (65 FE) MG EC tablet  Take 325 mg by mouth daily with breakfast.     insulin glargine 100 UNIT/ML injection  Commonly known as:  LANTUS  Inject 0.6-0.75 mLs (60-75 Units total)  into the skin 2 (two) times daily. 75 units in the morning then 60 units at bedtime     insulin lispro 100 UNIT/ML injection  Commonly known as:  HUMALOG  Inject 0.35-0.5 mLs (35-50 Units total) into the skin 3 (three) times daily before meals. Patient states that he takes 35 units at breakfast, 50 units at dinner and 50 units at supper.     metoprolol succinate 25 MG 24 hr tablet  Commonly known as:  TOPROL-XL  TAKE ONE (1) TABLET EACH DAY     pantoprazole 40 MG tablet  Commonly known as:  PROTONIX  Take 1 tablet (40 mg total) by mouth 2 (two) times daily.     torsemide 10 MG tablet  Commonly known as:  DEMADEX  Take 1 tablet (10 mg total) by mouth daily.       Follow-up Information    Follow up with [name]., MD. Schedule an appointment as soon as possible for a visit in 5 days.   Specialty:  Internal Medicine   Why:  To be seen with repeat labs (CBC,CMP & TSH).   Contact information:   [address] [name] [id number] [phone]       Follow up with Pixie Casino, MD. Schedule an appointment as soon as possible for a visit in 1 week.   Specialty:  Cardiology    Contact information:   [address] [place] [address] [phone]      No Known Allergies  Consultations:  None   Procedures/Studies: Mr Brain Wo Contrast  02/24/2016  CLINICAL DATA:  Weakness, vomiting and dizziness. History of hypertension, diabetes, stroke. EXAM: MRI HEAD WITHOUT CONTRAST TECHNIQUE: Multiplanar, multiecho pulse sequences of the brain and surrounding structures were obtained without intravenous contrast. COMPARISON:  CT HEAD February 23, 2016 at 2030 hours and MRI head June 27, 2012 FINDINGS: INTRACRANIAL CONTENTS: No reduced diffusion to suggest acute ischemia. No susceptibility artifact to suggest hemorrhage. The ventricles and sulci are normal for patient's age. Stable disproportionate moderate cerebellar and brainstem atrophy. LEFT para median pontine infarct. Old small bilateral cerebellar infarcts are new from 2013. Old RIGHT caudate head lacunar infarct. No suspicious parenchymal signal, masses or mass effect. Patchy supratentorial white matter FLAIR T2 hyperintensities advanced from prior MRI. No abnormal extra-axial fluid collections. No extra-axial masses though, contrast enhanced sequences would be more sensitive. Normal major intracranial vascular flow voids present at skull base. ORBITS: The included ocular globes and orbital contents are non-suspicious. Status post bilateral ocular lens implants. SINUSES: The mastoid air-cells and included paranasal sinuses are well-aerated. Persistent low T1, bright T2 signal RIGHT petrous apex most compatible with fluid. SKULL/SOFT TISSUES: No abnormal sellar expansion. No suspicious calvarial bone marrow signal. Craniocervical junction maintained. Patient is edentulous. IMPRESSION: No acute intracranial process. Stable disproportionate cerebellar and brainstem atrophy for age. Old cerebellar, pontine and RIGHT caudate infarcts. Moderate chronic small vessel ischemic disease. Electronically Signed   By: Elon Alas M.D.   On: 02/24/2016 00:01   Dg Chest Port 1 View  02/23/2016  CLINICAL DATA:  Weakness and shortness of breath for 1 day EXAM: PORTABLE CHEST 1 VIEW COMPARISON:  None. FINDINGS: Cardiac shadow is mildly enlarged. Postsurgical changes are noted. Mild vascular prominence is seen no focal edema is noted. Minimal atelectatic changes are noted in the right mid lung. No bony abnormality is seen. IMPRESSION: Mild vascular congestion without pulmonary edema. Electronically Signed   By: Inez Catalina M.D.   On: 02/23/2016  20:39   Ct Head Code Stroke W/o Cm  02/23/2016  CLINICAL DATA:  Dizziness EXAM: CT HEAD WITHOUT CONTRAST TECHNIQUE: Contiguous axial images were obtained from the base of the skull through the vertex without intravenous contrast. COMPARISON:  CT temporal bone 07/03/2012, MRI head 06/26/2012 FINDINGS: Moderate atrophy. Chronic microvascular ischemic change in the white matter. Negative for acute infarct.  Negative for acute hemorrhage or mass. Extensive vascular calcification 1 cm lesion right Peters apex unchanged from prior studies. There is loss of normal trabecular pattern and this is mildly expansile without aggressive destructive features. IMPRESSION: Atrophy and chronic ischemic change. 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 These results were called by telephone at the time of interpretation on 02/23/2016 at 8:41 pm to Dr. Canary Brim, who verbally acknowledged these results. Electronically Signed   By: Franchot Gallo M.D.   On: 02/23/2016 20:40      Subjective: Patient states that he feels back to his usual. Denies complaints. No nausea, vomiting, abdominal pain, diarrhea, dizziness, lightheadedness, dyspnea, chest pain or palpitations. Tolerating diet. As per RN, ambulated steadily in the room with supervision.  Discharge Exam:  Filed Vitals:   02/25/16 0139 02/25/16 0532 02/25/16 0942 02/25/16 1332   BP: 142/55 155/57 156/61 147/49  Pulse: 61 62 63 62  Temp: 98.4 F (36.9 C) 98.6 F (37 C) 97.7 F (36.5 C) 97.8 F (36.6 C)  TempSrc: Oral Oral Oral Oral  Resp: 16 18 20 18   Height:      Weight:      SpO2: 98% 98% 99% 96%    General exam: Pleasant middle-aged male sitting up comfortably in bed this morning. Respiratory system: Clear to auscultation. Respiratory effort normal. Cardiovascular system: S1 & S2 heard, RRR. No JVD, murmurs, rubs, gallops or clicks. No pedal edema. Telemetry: Sinus rhythm with first-degree AV block/BBB morphology.Occasional sinus bradycardia in the 50s. Gastrointestinal system: Abdomen is nondistended, soft and nontender. No organomegaly or masses felt. Normal bowel sounds heard. Central nervous system: Alert and oriented. No focal neurological deficits. Extremities: Symmetric 5 x 5 power. Approximately 0.75 cm diameter superficial clean ulcer over medial aspect of right big toe without acute findings. Skin: No rashes, lesions or ulcers Psychiatry: Judgement and insight appear normal. Mood & affect appropriate.     The results of significant diagnostics from this hospitalization (including imaging, microbiology, ancillary and laboratory) are listed below for reference.     Microbiology: No results found for this or any previous visit (from the past 240 hour(s)).   Labs: BNP (last 3 results)  Recent Labs  02/23/16 1956  BNP 99991111*   Basic Metabolic Panel:  Recent Labs Lab 02/23/16 1956 02/23/16 2026 02/25/16 0817  NA 137 141 135  K 4.2 4.2 4.3  CL 105 103 99*  CO2 24  --  26  GLUCOSE 310* 315* 255*  BUN 16 18 17   CREATININE 1.30* 1.20 1.27*  CALCIUM 8.8*  --  9.0   Liver Function Tests:  Recent Labs Lab 02/23/16 1956  AST 52*  ALT 62  ALKPHOS 43  BILITOT 0.8  PROT 6.2*  ALBUMIN 4.0    Recent Labs Lab 02/23/16 2238  LIPASE 36   No results for input(s): AMMONIA in the last 168 hours. CBC:  Recent Labs Lab  02/23/16 1956 02/23/16 2026  WBC 6.3  --   NEUTROABS 4.1  --   HGB 13.2 13.3  HCT 39.6 39.0  MCV 88.0  --  PLT 134*  --    Cardiac Enzymes: No results for input(s): CKTOTAL, CKMB, CKMBINDEX, TROPONINI in the last 168 hours. BNP: Invalid input(s): POCBNP CBG:  Recent Labs Lab 02/24/16 1126 02/24/16 1647 02/24/16 2149 02/25/16 0612 02/25/16 1133  GLUCAP 268* 303* 297* 262* 276*   D-Dimer No results for input(s): DDIMER in the last 72 hours. Hgb A1c  Recent Labs  02/24/16 0526  HGBA1C 9.0*   Lipid Profile  Recent Labs  02/24/16 0526  CHOL 182  HDL 29*  LDLCALC 99  TRIG 268*  CHOLHDL 6.3   Thyroid function studies No results for input(s): TSH, T4TOTAL, T3FREE, THYROIDAB in the last 72 hours.  Invalid input(s): FREET3 Anemia work up No results for input(s): VITAMINB12, FOLATE, FERRITIN, TIBC, IRON, RETICCTPCT in the last 72 hours. Urinalysis    Component Value Date/Time   COLORURINE STRAW* 02/23/2016 2140   APPEARANCEUR CLEAR 02/23/2016 2140   LABSPEC 1.023 02/23/2016 2140   PHURINE 7.0 02/23/2016 2140   GLUCOSEU >1000* 02/23/2016 2140   HGBUR NEGATIVE 02/23/2016 2140   BILIRUBINUR NEGATIVE 02/23/2016 2140   KETONESUR NEGATIVE 02/23/2016 2140   PROTEINUR NEGATIVE 02/23/2016 2140   UROBILINOGEN 0.2 11/25/2013 1741   NITRITE NEGATIVE 02/23/2016 2140   LEUKOCYTESUR NEGATIVE 02/23/2016 2140   Sepsis Labs Invalid input(s): PROCALCITONIN,  WBC,  LACTICIDVEN  Discussed in detail with patient's niece Ms. Faith Rogue. Updated care and answered questions.  Time coordinating discharge: Over 30 minutes  SIGNED:  Vernell Leep, MD, FACP, [place]. [facility] Pager [phone]  If 7PM-7AM, please contact night-coverage [email or web address] Password [facility] 02/25/2016, 5:12 PM

## 2016-02-25 NOTE — Care Management Note (Signed)
Case Management Note  Patient Details  Name: ABDULAH [name] MRN: [id number] Date of Birth: 01/01/1946  Subjective/Objective:                    Action/Plan: Patient discharging home with orders for [facility] services. CM met with the patient and provided him a list of [facility] agencies in the [place] area. He selected [facility]. Butch Penny with [facility] notified and accepted the referral. Will update bedside RN.   Expected Discharge Date:                  Expected Discharge Plan:  [place]  In-House Referral:     Discharge planning Services  CM Consult  Post Acute Care Choice:  Home Health Choice offered to:  Patient  DME Arranged:    DME Agency:     HH Arranged:  PT, OT HH Agency:  [facility]  Status of Service:  Completed, signed off  If discussed at [place] of Stay Meetings, dates discussed:    Additional Comments:  Pollie Friar, RN 02/25/2016, 3:33 PM

## 2016-02-26 ENCOUNTER — Telehealth (HOSPITAL_COMMUNITY): Payer: Self-pay

## 2016-02-26 NOTE — Telephone Encounter (Signed)
02/26/16 he called to say he missed his appt on 7/13 because he was in the hospital

## 2016-03-01 ENCOUNTER — Ambulatory Visit (HOSPITAL_COMMUNITY): Payer: Medicare Other | Admitting: Physical Therapy

## 2016-03-01 DIAGNOSIS — S90931S Unspecified superficial injury of right great toe, sequela: Secondary | ICD-10-CM

## 2016-03-01 DIAGNOSIS — R262 Difficulty in walking, not elsewhere classified: Secondary | ICD-10-CM | POA: Diagnosis not present

## 2016-03-01 DIAGNOSIS — R2681 Unsteadiness on feet: Secondary | ICD-10-CM | POA: Diagnosis not present

## 2016-03-01 DIAGNOSIS — L97513 Non-pressure chronic ulcer of other part of right foot with necrosis of muscle: Secondary | ICD-10-CM | POA: Diagnosis not present

## 2016-03-01 NOTE — Therapy (Signed)
[place], [name], [address] Phone: [phone]   Fax:  [phone]  Wound Care Therapy  Patient Details  Name: [name] MRN: [id number] Date of Birth: 08/14/1946 No Data Recorded  Encounter Date: 03/01/2016      PT End of Session - 03/01/16 1226    Visit Number 2   Number of Visits 8   Date for PT Re-Evaluation 03/22/16   Authorization Type UHC Medicare    Authorization Time Period 02/23/16 to 03/25/16   Authorization - Visit Number 2   Authorization - Number of Visits 10   PT Start Time 1020   PT Stop Time 1045   PT Time Calculation (min) 25 min   Activity Tolerance Patient tolerated treatment well   Behavior During Therapy [facility] for tasks assessed/performed      Past Medical History  Diagnosis Date  . Hypertension   . Diabetes mellitus     type 2   . History of valve replacement 2010    [facility] Ease bioprosthetic 57mm  . Thyroid disease   . Stroke [facility]) 2003    R-sided weakness & upper extremity swelling   . Coronary artery disease   . Dyslipidemia   . At risk for sudden cardiac death 2013/06/12  . PAF (paroxysmal atrial fibrillation) ([place]) 05/2013    converted with amiodarone to SR  . Chronic anticoagulation 05/2013    started  . S/P cardiac catheterization, Rt & Lt heart cath 06/05/2013 06-12-13  . S/P CABG x 3 2010  . GERD (gastroesophageal reflux disease)   . Cataract   . Chronic kidney disease     Patient reports he is seeing Kidney doctor in September    Past Surgical History  Procedure Laterality Date  . Coronary artery bypass graft  01/26/2009    LIMA to LAD, SVG to circumflex, SVG to PDA  . Aortic valve replacement  01/26/2009    [facility] Ease bioprosthetic 71mm valve  . Transthoracic echocardiogram  09/2011    grade 1 diastolic dysfunction; increasing valve gradient; calcified MV annulus   . Cardiac catheterization  06/05/2013    native 3 vessel disease;  patent LIMA to LAD, SVG to OM and SVG to PDA; Elevated right and left heart filling pressures, with predominantly pulmonary venous hypertension, normal PVR  . Transthoracic echocardiogram  06/03/2013    EF 25-30%, mod conc. hypertrophy, grade 3 diastolic dysfunction; LA mod dilated; calcified MV annulus; transaortic gradients are normal for the bioprosthetic valve; inf vena cava dilated (elevated CVP) - LifeVest  . Left and right heart catheterization with coronary angiogram N/A 06/05/2013    Procedure: LEFT AND RIGHT HEART CATHETERIZATION WITH CORONARY ANGIOGRAM;  Surgeon: Leonie Man, MD;  Location: [facility] CATH LAB;  Service: Cardiovascular;  Laterality: N/A;  . Graft(s) angiogram  06/05/2013    Procedure: GRAFT(S) Cyril Loosen;  Surgeon: Leonie Man, MD;  Location: [facility] CATH LAB;  Service: Cardiovascular;;  . Colonoscopy  2007    Dr. Aviva Signs: internal hemorrhoids    There were no vitals filed for this visit.                  Wound Therapy - 03/01/16 1111    Subjective Pt states he was admitted to [facility] hospital due to "passing out" episode in [place] last week.  States they think he may have had food poisioning but no signs of stroke or other medical complications.  States his toe bandage  has not been changed since then.     Pain Assessment No/denies pain   Wound Properties Date First Assessed: 02/23/16 Wound Type: Other (Comment) , open area R great toe   Location: Other (Comment) , Medial R great toe   Location Orientation: Right;Medial Present on Admission: Yes   Dressing Type Gauze (Comment)   Dressing Changed Changed   Dressing Status Clean;Dry;Intact   Dressing Change Frequency PRN   % Wound base Red or Granulating 95%   % Wound base Yellow 5%   Peri-wound Assessment Other (Comment)  dry, callous   Margins Unattached edges (unapproximated)  attached 50%   Incision Properties Date First Assessed: 04/23/15 Time First Assessed: 1738 Location: Groin Location  Orientation: Left Present on Admission: No   Wound Therapy - Clinical Statement Pt with dressing still intact from initial evaluation.  Completed woundcare, debriding all callous from perimeter of wound.  One area most inferior with some undermined edges able to debride to promote approximation.  some hypergranlulation present as well.  Changed dressing to silver hydrofiber as worked well last visits and applied medipore at a stretch to induce compression to woundbed to reduce hypergranulation.  Pt reported over comfrot at end of session.     Wound Therapy - Functional Problem List dificulty walking, dificulty wearing shoes    Factors Delaying/Impairing Wound Healing Altered sensation;Diabetes Mellitus;Infection - systemic/local;Immobility;Multiple medical problems;Vascular compromise   Hydrotherapy Plan Debridement;Dressing change;Patient/family education   Wound Therapy - Frequency Other (comment)   Wound Plan Continued cleasing and  debridement; may alter dressings as needed if wound does not heal as anticipated                    PT Short Term Goals - 02/23/16 1714    PT SHORT TERM GOAL #1   Title Patient to demonstrate reduction of wound area by at least 50% on all measured dimensions in order to demonstrate improvement of condition    Time 2   Period Weeks   Status New   PT SHORT TERM GOAL #2   Title Patient will verbally be able to verbally state at least 3 methods of foot care/ways to prevent further wound formation in order to prevent wound recurrence    Time 2   Period Weeks   Status New           PT Long Term Goals - 02/23/16 1715    PT LONG TERM GOAL #1   Title Patient to be able to wear all pairs of shoes without exacerbation of R great toe in order to improve QOL and recurrence of wound    Time 4   Period Weeks   Status New   PT LONG TERM GOAL #2   Title Patient to display full wound closure in order to demonstrate resolution of condition    Time 4    Period Weeks   Status New             Patient will benefit from skilled therapeutic intervention in order to improve the following deficits and impairments:     Visit Diagnosis: Unspecified superficial injury of right great toe, sequela  Difficulty in walking, not elsewhere classified  Unsteadiness on feet     Problem List Patient Active Problem List   Diagnosis Date Noted  . Nausea & vomiting 02/24/2016  . A-fib ([place]) 02/23/2016  . Hypothyroidism 02/23/2016  . CKD (chronic kidney disease), stage III 02/23/2016  . Anxiety 02/23/2016  . 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 Dizziness 02/23/2016  . Open wound of right foot 02/23/2016  . Rectal bleeding 07/28/2015  . Chronic anticoagulation 07/28/2015  . PVD (peripheral vascular disease) ([place])   . S/P angioplasty 04/23/2015  . Peripheral arterial occlusive disease ([place]) 04/23/2015  . PAD (peripheral artery disease) ([place])   . Critical lower limb ischemia   . Toe ulcer, right ([place])   . Type II or unspecified type diabetes mellitus without mention of complication, uncontrolled 11/27/2013  . Acute respiratory failure with hypoxia ([place]) 11/26/2013  . CAP (community acquired pneumonia) 11/26/2013  . CHF exacerbation ([place]) 11/25/2013  . Cardiomyopathy, ischemic 06/13/2013  . At risk for sudden cardiac death Jul 05, 2013  . S/P cardiac catheterization, Rt & Lt heart cath 06/05/2013 07/05/2013  . Acute on chronic combined systolic and diastolic HF (heart failure), NYHA class 3 ([place]) 06/04/2013  . Atrial flutter with rapid ventricular response ([place]) 06/02/2013  . Acute renal failure: Cr up to 1.8 06/02/2013  . Hyperkalemia 06/02/2013    Class: Acute  . LBBB (left bundle branch block) 06/01/2013  . Hypotension- secondary to AF and Diltiazem Rx 06/01/2013  . Obesity (BMI 30-39.9) 06/01/2013  . Acute on chronic diastolic heart failure - due to Afib AVR 06/01/2013   . Atrial fibrillation with RVR- converted with Amiodarone 05/31/2013  . Stroke- Lt brain 2003 04/08/2013  . Hemiplegia affecting right dominant side ([place]) 04/08/2013  . Cataract 04/08/2013  . GERD (gastroesophageal reflux disease) 04/08/2013  . S/P CABG x 3 - 2010 04/08/2013  . S/P AVR-tissue, 2010 04/08/2013  . DM2 (diabetes mellitus, type 2) ([place]) 04/08/2013  . Dyslipidemia 04/08/2013  . SUBACROMIAL BURSITIS, LEFT 11/05/2009    Teena Irani, PTA/CLT [phone]  03/01/2016, 12:27 PM  [place] [address] [place], [name], [address] Phone: [phone]   Fax:  [phone]  Name: [name] MRN: [id number] Date of Birth: Sep 25, 1945

## 2016-03-03 DIAGNOSIS — E1165 Type 2 diabetes mellitus with hyperglycemia: Secondary | ICD-10-CM | POA: Diagnosis not present

## 2016-03-03 DIAGNOSIS — E039 Hypothyroidism, unspecified: Secondary | ICD-10-CM | POA: Diagnosis not present

## 2016-03-03 DIAGNOSIS — I4891 Unspecified atrial fibrillation: Secondary | ICD-10-CM | POA: Diagnosis not present

## 2016-03-03 DIAGNOSIS — D649 Anemia, unspecified: Secondary | ICD-10-CM | POA: Diagnosis not present

## 2016-03-03 DIAGNOSIS — R42 Dizziness and giddiness: Secondary | ICD-10-CM | POA: Diagnosis not present

## 2016-03-04 ENCOUNTER — Ambulatory Visit (HOSPITAL_COMMUNITY): Payer: Medicare Other | Admitting: Physical Therapy

## 2016-03-04 DIAGNOSIS — R262 Difficulty in walking, not elsewhere classified: Secondary | ICD-10-CM

## 2016-03-04 DIAGNOSIS — Z95818 Presence of other cardiac implants and grafts: Secondary | ICD-10-CM | POA: Diagnosis not present

## 2016-03-04 DIAGNOSIS — I480 Paroxysmal atrial fibrillation: Secondary | ICD-10-CM | POA: Diagnosis not present

## 2016-03-04 DIAGNOSIS — L97513 Non-pressure chronic ulcer of other part of right foot with necrosis of muscle: Secondary | ICD-10-CM | POA: Diagnosis not present

## 2016-03-04 DIAGNOSIS — E785 Hyperlipidemia, unspecified: Secondary | ICD-10-CM | POA: Diagnosis not present

## 2016-03-04 DIAGNOSIS — K219 Gastro-esophageal reflux disease without esophagitis: Secondary | ICD-10-CM | POA: Diagnosis not present

## 2016-03-04 DIAGNOSIS — I5022 Chronic systolic (congestive) heart failure: Secondary | ICD-10-CM | POA: Diagnosis not present

## 2016-03-04 DIAGNOSIS — E039 Hypothyroidism, unspecified: Secondary | ICD-10-CM | POA: Diagnosis not present

## 2016-03-04 DIAGNOSIS — Z794 Long term (current) use of insulin: Secondary | ICD-10-CM | POA: Diagnosis not present

## 2016-03-04 DIAGNOSIS — S81001D Unspecified open wound, right knee, subsequent encounter: Secondary | ICD-10-CM | POA: Diagnosis not present

## 2016-03-04 DIAGNOSIS — I69351 Hemiplegia and hemiparesis following cerebral infarction affecting right dominant side: Secondary | ICD-10-CM | POA: Diagnosis not present

## 2016-03-04 DIAGNOSIS — S91109D Unspecified open wound of unspecified toe(s) without damage to nail, subsequent encounter: Secondary | ICD-10-CM | POA: Diagnosis not present

## 2016-03-04 DIAGNOSIS — S90931S Unspecified superficial injury of right great toe, sequela: Secondary | ICD-10-CM

## 2016-03-04 DIAGNOSIS — N183 Chronic kidney disease, stage 3 (moderate): Secondary | ICD-10-CM | POA: Diagnosis not present

## 2016-03-04 DIAGNOSIS — E669 Obesity, unspecified: Secondary | ICD-10-CM | POA: Diagnosis not present

## 2016-03-04 DIAGNOSIS — I130 Hypertensive heart and chronic kidney disease with heart failure and stage 1 through stage 4 chronic kidney disease, or unspecified chronic kidney disease: Secondary | ICD-10-CM | POA: Diagnosis not present

## 2016-03-04 DIAGNOSIS — Z7982 Long term (current) use of aspirin: Secondary | ICD-10-CM | POA: Diagnosis not present

## 2016-03-04 DIAGNOSIS — R2681 Unsteadiness on feet: Secondary | ICD-10-CM

## 2016-03-04 DIAGNOSIS — I2510 Atherosclerotic heart disease of native coronary artery without angina pectoris: Secondary | ICD-10-CM | POA: Diagnosis not present

## 2016-03-04 DIAGNOSIS — E1122 Type 2 diabetes mellitus with diabetic chronic kidney disease: Secondary | ICD-10-CM | POA: Diagnosis not present

## 2016-03-04 DIAGNOSIS — Z7901 Long term (current) use of anticoagulants: Secondary | ICD-10-CM | POA: Diagnosis not present

## 2016-03-04 NOTE — Therapy (Signed)
[place], [name], [address] Phone: [phone]   Fax:  [phone]  Wound Care Therapy  Patient Details  Name: [name] MRN: [id number] Date of Birth: Apr 12, 1946 No Data Recorded  Encounter Date: 03/04/2016      PT End of Session - 03/04/16 1635    Visit Number 3   Number of Visits 8   Date for PT Re-Evaluation 03/22/16   Authorization Type UHC Medicare    Authorization Time Period 02/23/16 to 03/25/16   Authorization - Visit Number 3   Authorization - Number of Visits 10   PT Start Time [id number]   PT Stop Time 1540   PT Time Calculation (min) 25 min   Activity Tolerance Patient tolerated treatment well   Behavior During Therapy [facility] for tasks assessed/performed      Past Medical History  Diagnosis Date  . Hypertension   . Diabetes mellitus     type 2   . History of valve replacement 2010    [facility] Ease bioprosthetic 67mm  . Thyroid disease   . Stroke [facility]) 2003    R-sided weakness & upper extremity swelling   . Coronary artery disease   . Dyslipidemia   . At risk for sudden cardiac death 2013-06-13  . PAF (paroxysmal atrial fibrillation) ([place]) 05/2013    converted with amiodarone to SR  . Chronic anticoagulation 05/2013    started  . S/P cardiac catheterization, Rt & Lt heart cath 06/05/2013 Jun 13, 2013  . S/P CABG x 3 2010  . GERD (gastroesophageal reflux disease)   . Cataract   . Chronic kidney disease     Patient reports he is seeing Kidney doctor in September    Past Surgical History  Procedure Laterality Date  . Coronary artery bypass graft  01/26/2009    LIMA to LAD, SVG to circumflex, SVG to PDA  . Aortic valve replacement  01/26/2009    [facility] Ease bioprosthetic 23mm valve  . Transthoracic echocardiogram  09/2011    grade 1 diastolic dysfunction; increasing valve gradient; calcified MV annulus   . Cardiac catheterization  06/05/2013    native 3 vessel disease;  patent LIMA to LAD, SVG to OM and SVG to PDA; Elevated right and left heart filling pressures, with predominantly pulmonary venous hypertension, normal PVR  . Transthoracic echocardiogram  06/03/2013    EF 25-30%, mod conc. hypertrophy, grade 3 diastolic dysfunction; LA mod dilated; calcified MV annulus; transaortic gradients are normal for the bioprosthetic valve; inf vena cava dilated (elevated CVP) - LifeVest  . Left and right heart catheterization with coronary angiogram N/A 06/05/2013    Procedure: LEFT AND RIGHT HEART CATHETERIZATION WITH CORONARY ANGIOGRAM;  Surgeon: Leonie Man, MD;  Location: [facility] CATH LAB;  Service: Cardiovascular;  Laterality: N/A;  . Graft(s) angiogram  06/05/2013    Procedure: GRAFT(S) Cyril Loosen;  Surgeon: Leonie Man, MD;  Location: [facility] CATH LAB;  Service: Cardiovascular;;  . Colonoscopy  2007    Dr. Aviva Signs: internal hemorrhoids    There were no vitals filed for this visit.                  Wound Therapy - 03/04/16 1629    Subjective Pt states that his wound is not as bad this time.  States that his shoe rubbed the area so now he is wearing sandals.    Pain Assessment No/denies pain   Wound Properties Date First Assessed: 02/23/16 Wound  Type: Other (Comment) , open area R great toe   Location: Other (Comment) , Medial R great toe   Location Orientation: Right;Medial Present on Admission: Yes   Dressing Type Gauze (Comment);Silver hydrofiber   Dressing Changed Changed   Dressing Status Clean;Dry;Intact   Dressing Change Frequency PRN   Site / Wound Assessment Clean;Granulation tissue   % Wound base Red or Granulating 95%   % Wound base Yellow 5%   Peri-wound Assessment Other (Comment)  dry, callous   Margins Unattached edges (unapproximated)  attached 50%   Drainage Amount Minimal   Treatment Cleansed;Debridement (Selective)   Incision Properties Date First Assessed: 04/23/15 Time First Assessed: 1738 Location: Groin Location  Orientation: Left Present on Admission: No   Selective Debridement - Location callous halo around wound bed    Selective Debridement - Tools Used Forceps;Scissors   Selective Debridement - Tissue Removed callous   Wound Therapy - Clinical Statement Wound has slight hypertropic tissue.  Used medipore tape tightly around pt toe to attempt to decrease hypertrophic tissue.    Wound Therapy - Functional Problem List dificulty walking, dificulty wearing shoes    Factors Delaying/Impairing Wound Healing Altered sensation;Diabetes Mellitus;Infection - systemic/local;Immobility;Multiple medical problems;Vascular compromise   Hydrotherapy Plan Debridement;Dressing change;Patient/family education   Wound Therapy - Frequency Other (comment)   Wound Plan Continued cleasing and  debridement; may alter dressings as needed if wound does not heal as anticipated                  PT Education - 03/04/16 1632    Education Details Urged pt to wear his diabetic shoes. Pt is reluctant  due to the cost,(wants to save them for special occasions)          PT Short Term Goals - 03/04/16 1637    PT SHORT TERM GOAL #1   Title Patient to demonstrate reduction of wound area by at least 50% on all measured dimensions in order to demonstrate improvement of condition    Time 2   Period Weeks   Status On-going   PT SHORT TERM GOAL #2   Title Patient will verbally be able to verbally state at least 3 methods of foot care/ways to prevent further wound formation in order to prevent wound recurrence    Time 2   Period Weeks   Status On-going           PT Long Term Goals - 03/04/16 1637    PT LONG TERM GOAL #1   Title Patient to be able to wear all pairs of shoes without exacerbation of R great toe in order to improve QOL and recurrence of wound    Time 4   Period Weeks   Status On-going   PT LONG TERM GOAL #2   Title Patient to display full wound closure in order to demonstrate resolution of condition     Time 4   Period Weeks   Status On-going               Plan - 03/04/16 1636    Clinical Impression Statement see above    Rehab Potential Good   PT Treatment/Interventions Other (comment);Patient/family education   PT Next Visit Plan measure wound      Patient will benefit from skilled therapeutic intervention in order to improve the following deficits and impairments:  Impaired sensation, Other (comment), Difficulty walking  Visit Diagnosis: Unspecified superficial injury of right great toe, sequela  Difficulty in walking, not elsewhere classified  Unsteadiness on feet  Non-pressure chronic ulcer of other part of right foot with necrosis of muscle [facility])     Problem List Patient Active Problem List   Diagnosis Date Noted  . Nausea & vomiting 02/24/2016  . A-fib ([place]) 02/23/2016  . Hypothyroidism 02/23/2016  . CKD (chronic kidney disease), stage III 02/23/2016  . Anxiety 02/23/2016  . Chronic combined systolic (congestive) and diastolic (congestive) heart failure ([place]) 02/23/2016  . Dizziness 02/23/2016  . Open wound of right foot 02/23/2016  . Rectal bleeding 07/28/2015  . Chronic anticoagulation 07/28/2015  . PVD (peripheral vascular disease) ([place])   . S/P angioplasty 04/23/2015  . Peripheral arterial occlusive disease ([place]) 04/23/2015  . PAD (peripheral artery disease) ([place])   . Critical lower limb ischemia   . Toe ulcer, right ([place])   . Type II or unspecified type diabetes mellitus without mention of complication, uncontrolled 11/27/2013  . Acute respiratory failure with hypoxia ([place]) 11/26/2013  . CAP (community acquired pneumonia) 11/26/2013  . CHF exacerbation ([place]) 11/25/2013  . Cardiomyopathy, ischemic 06/13/2013  . At risk for sudden cardiac death June 23, 2013  . S/P cardiac catheterization, Rt & Lt heart cath 06/05/2013 2013-06-23  . Acute on chronic combined systolic and diastolic HF (heart failure), NYHA class 3 ([place]) 06/04/2013  . Atrial  flutter with rapid ventricular response ([place]) 06/02/2013  . Acute renal failure: Cr up to 1.8 06/02/2013  . Hyperkalemia 06/02/2013    Class: Acute  . LBBB (left bundle branch block) 06/01/2013  . Hypotension- secondary to AF and Diltiazem Rx 06/01/2013  . Obesity (BMI 30-39.9) 06/01/2013  . Acute on chronic diastolic heart failure - due to Afib AVR 06/01/2013  . Atrial fibrillation with RVR- converted with Amiodarone 05/31/2013  . Stroke- Lt brain 2003 04/08/2013  . Hemiplegia affecting right dominant side ([place]) 04/08/2013  . Cataract 04/08/2013  . GERD (gastroesophageal reflux disease) 04/08/2013  . S/P CABG x 3 - 2010 04/08/2013  . S/P AVR-tissue, 2010 04/08/2013  . DM2 (diabetes mellitus, type 2) ([place]) 04/08/2013  . Dyslipidemia 04/08/2013  . SUBACROMIAL BURSITIS, LEFT 11/05/2009   Rayetta Humphrey, PT CLT [phone] 03/04/2016, 4:38 PM  [place] [address] [place], [name], [address] Phone: [phone]   Fax:  [phone]  Name: MATHHEW DOODY MRN: [id number] Date of Birth: Jan 12, 1946

## 2016-03-07 ENCOUNTER — Ambulatory Visit (HOSPITAL_COMMUNITY): Payer: Medicare Other | Admitting: Physical Therapy

## 2016-03-07 DIAGNOSIS — N183 Chronic kidney disease, stage 3 (moderate): Secondary | ICD-10-CM | POA: Diagnosis not present

## 2016-03-07 DIAGNOSIS — S90931S Unspecified superficial injury of right great toe, sequela: Secondary | ICD-10-CM

## 2016-03-07 DIAGNOSIS — R2681 Unsteadiness on feet: Secondary | ICD-10-CM | POA: Diagnosis not present

## 2016-03-07 DIAGNOSIS — I130 Hypertensive heart and chronic kidney disease with heart failure and stage 1 through stage 4 chronic kidney disease, or unspecified chronic kidney disease: Secondary | ICD-10-CM | POA: Diagnosis not present

## 2016-03-07 DIAGNOSIS — Z7901 Long term (current) use of anticoagulants: Secondary | ICD-10-CM | POA: Diagnosis not present

## 2016-03-07 DIAGNOSIS — R262 Difficulty in walking, not elsewhere classified: Secondary | ICD-10-CM | POA: Diagnosis not present

## 2016-03-07 DIAGNOSIS — I69351 Hemiplegia and hemiparesis following cerebral infarction affecting right dominant side: Secondary | ICD-10-CM | POA: Diagnosis not present

## 2016-03-07 DIAGNOSIS — I5022 Chronic systolic (congestive) heart failure: Secondary | ICD-10-CM | POA: Diagnosis not present

## 2016-03-07 DIAGNOSIS — Z95818 Presence of other cardiac implants and grafts: Secondary | ICD-10-CM | POA: Diagnosis not present

## 2016-03-07 DIAGNOSIS — Z794 Long term (current) use of insulin: Secondary | ICD-10-CM | POA: Diagnosis not present

## 2016-03-07 DIAGNOSIS — K219 Gastro-esophageal reflux disease without esophagitis: Secondary | ICD-10-CM | POA: Diagnosis not present

## 2016-03-07 DIAGNOSIS — E1122 Type 2 diabetes mellitus with diabetic chronic kidney disease: Secondary | ICD-10-CM | POA: Diagnosis not present

## 2016-03-07 DIAGNOSIS — I480 Paroxysmal atrial fibrillation: Secondary | ICD-10-CM | POA: Diagnosis not present

## 2016-03-07 DIAGNOSIS — Z7982 Long term (current) use of aspirin: Secondary | ICD-10-CM | POA: Diagnosis not present

## 2016-03-07 DIAGNOSIS — S91109D Unspecified open wound of unspecified toe(s) without damage to nail, subsequent encounter: Secondary | ICD-10-CM | POA: Diagnosis not present

## 2016-03-07 DIAGNOSIS — E669 Obesity, unspecified: Secondary | ICD-10-CM | POA: Diagnosis not present

## 2016-03-07 DIAGNOSIS — E785 Hyperlipidemia, unspecified: Secondary | ICD-10-CM | POA: Diagnosis not present

## 2016-03-07 DIAGNOSIS — E039 Hypothyroidism, unspecified: Secondary | ICD-10-CM | POA: Diagnosis not present

## 2016-03-07 DIAGNOSIS — I2510 Atherosclerotic heart disease of native coronary artery without angina pectoris: Secondary | ICD-10-CM | POA: Diagnosis not present

## 2016-03-07 DIAGNOSIS — S81001D Unspecified open wound, right knee, subsequent encounter: Secondary | ICD-10-CM | POA: Diagnosis not present

## 2016-03-07 DIAGNOSIS — L97513 Non-pressure chronic ulcer of other part of right foot with necrosis of muscle: Secondary | ICD-10-CM | POA: Diagnosis not present

## 2016-03-07 NOTE — Therapy (Signed)
[name], [name], [address] Phone: [phone]   Fax:  [phone]  Wound Care Therapy  Patient Details  Name: [name] MRN: [id number] Date of Birth: June 30, 1946 No Data Recorded  Encounter Date: 03/07/2016      PT End of Session - 03/07/16 0943    Visit Number 4   Number of Visits 8   Date for PT Re-Evaluation 03/22/16   Authorization Type UHC Medicare    Authorization Time Period 02/23/16 to 03/25/16   Authorization - Visit Number 4   Authorization - Number of Visits 10   PT Start Time 0902   PT Stop Time 0925  simple wound    PT Time Calculation (min) 23 min   Activity Tolerance Patient tolerated treatment well   Behavior During Therapy [facility] for tasks assessed/performed      Past Medical History:  Diagnosis Date  . At risk for sudden cardiac death June 23, 2013  . Cataract   . Chronic anticoagulation 05/2013   started  . Chronic kidney disease    Patient reports he is seeing Kidney doctor in September  . Coronary artery disease   . Diabetes mellitus    type 2   . Dyslipidemia   . GERD (gastroesophageal reflux disease)   . History of valve replacement 2010   [facility] Ease bioprosthetic 69mm  . Hypertension   . PAF (paroxysmal atrial fibrillation) ([place]) 05/2013   converted with amiodarone to SR  . S/P CABG x 3 2010  . S/P cardiac catheterization, Rt & Lt heart cath 06/05/2013 June 23, 2013  . Stroke [facility]) 2003   R-sided weakness & upper extremity swelling   . Thyroid disease     Past Surgical History:  Procedure Laterality Date  . AORTIC VALVE REPLACEMENT  01/26/2009   [facility] Ease bioprosthetic 85mm valve  . CARDIAC CATHETERIZATION  06/05/2013   native 3 vessel disease; patent LIMA to LAD, SVG to OM and SVG to PDA; Elevated right and left heart filling pressures, with predominantly pulmonary venous hypertension, normal PVR  . COLONOSCOPY  2007   Dr. Aviva Signs: internal  hemorrhoids  . CORONARY ARTERY BYPASS GRAFT  01/26/2009   LIMA to LAD, SVG to circumflex, SVG to PDA  . GRAFT(S) ANGIOGRAM  06/05/2013   Procedure: GRAFT(S) Cyril Loosen;  Surgeon: Leonie Man, MD;  Location: [facility] CATH LAB;  Service: Cardiovascular;;  . LEFT AND RIGHT HEART CATHETERIZATION WITH CORONARY ANGIOGRAM N/A 06/05/2013   Procedure: LEFT AND RIGHT HEART CATHETERIZATION WITH CORONARY ANGIOGRAM;  Surgeon: Leonie Man, MD;  Location: [facility] CATH LAB;  Service: Cardiovascular;  Laterality: N/A;  . TRANSTHORACIC ECHOCARDIOGRAM  09/2011   grade 1 diastolic dysfunction; increasing valve gradient; calcified MV annulus   . TRANSTHORACIC ECHOCARDIOGRAM  06/03/2013   EF 25-30%, mod conc. hypertrophy, grade 3 diastolic dysfunction; LA mod dilated; calcified MV annulus; transaortic gradients are normal for the bioprosthetic valve; inf vena cava dilated (elevated CVP) - LifeVest    There were no vitals filed for this visit.                  Wound Therapy - 03/07/16 0938    Subjective Patient reports he is doing well, had a little bit of toe pain over the weekend but none right now    Patient and Family Stated Goals wound to heal    Pain Assessment No/denies pain   Wound Properties Date First Assessed: 02/23/16 Wound Type: Other (Comment) ,  open area R great toe   Location: Other (Comment) , Medial R great toe   Location Orientation: Right;Medial Present on Admission: Yes   Dressing Type Gauze (Comment);Silver hydrofiber   Dressing Changed Changed   Dressing Status Clean;Dry;Intact   Dressing Change Frequency PRN   Site / Wound Assessment Clean;Granulation tissue   % Wound base Red or Granulating 95%   % Wound base Yellow 5%   Peri-wound Assessment Other (Comment)  dry, reduced callous but still present    Margins Unattached edges (unapproximated)   Drainage Amount Minimal   Treatment Cleansed;Other (Comment);Packing (Impregnated strip)  silver, gauze, medipore, vasoilne around  wound border    Incision Properties Date First Assessed: 04/23/15 Time First Assessed: 1738 Location: Groin Location Orientation: Left Present on Admission: No   Wound Therapy - Clinical Statement Wound continues to display a small amount of slough around edges especially; he does continue to have some callous but this appears to be reduced since debridement and tissue hydration last session. Did not debride today, due to vastly improved callous and slough being spread in very tiny areas across wound bed/likely to be absorpbed by silver dressing. Dressed with silver hydrofiber/vasoline around edges, then gauze and continued with medipore tape. Continued to educate patient regarding keeping blood sugars in appropriate range and correct footwaear throughout session today.    Wound Therapy - Functional Problem List dificulty walking, dificulty wearing shoes    Factors Delaying/Impairing Wound Healing Altered sensation;Diabetes Mellitus;Infection - systemic/local;Immobility;Multiple medical problems;Vascular compromise   Hydrotherapy Plan Debridement;Dressing change;Patient/family education   Wound Therapy - Frequency Other (comment)   Wound Plan Continued cleasing and  debridement; may alter dressings as needed if wound does not heal as anticipated                  PT Education - 03/07/16 0942    Education provided Yes   Education Details wear diabetic shoes, maintain blood sugars in appropriate range to facilitate healing/other diabetic complications (patient states as long as they are under 200 he is happy)   Person(s) Educated Patient   Methods Explanation   Comprehension Verbalized understanding          PT Short Term Goals - 03/04/16 1637      PT SHORT TERM GOAL #1   Title Patient to demonstrate reduction of wound area by at least 50% on all measured dimensions in order to demonstrate improvement of condition    Time 2   Period Weeks   Status On-going     PT SHORT TERM GOAL  #2   Title Patient will verbally be able to verbally state at least 3 methods of foot care/ways to prevent further wound formation in order to prevent wound recurrence    Time 2   Period Weeks   Status On-going           PT Long Term Goals - 03/04/16 1637      PT LONG TERM GOAL #1   Title Patient to be able to wear all pairs of shoes without exacerbation of R great toe in order to improve QOL and recurrence of wound    Time 4   Period Weeks   Status On-going     PT LONG TERM GOAL #2   Title Patient to display full wound closure in order to demonstrate resolution of condition    Time 4   Period Weeks   Status On-going  Patient will benefit from skilled therapeutic intervention in order to improve the following deficits and impairments:     Visit Diagnosis: Unspecified superficial injury of right great toe, sequela  Difficulty in walking, not elsewhere classified     Problem List Patient Active Problem List   Diagnosis Date Noted  . Nausea & vomiting 02/24/2016  . A-fib ([place]) 02/23/2016  . Hypothyroidism 02/23/2016  . CKD (chronic kidney disease), stage III 02/23/2016  . Anxiety 02/23/2016  . Chronic combined systolic (congestive) and diastolic (congestive) heart failure ([place]) 02/23/2016  . Dizziness 02/23/2016  . Open wound of right foot 02/23/2016  . Rectal bleeding 07/28/2015  . Chronic anticoagulation 07/28/2015  . PVD (peripheral vascular disease) ([place])   . S/P angioplasty 04/23/2015  . Peripheral arterial occlusive disease ([place]) 04/23/2015  . PAD (peripheral artery disease) ([place])   . Critical lower limb ischemia   . Toe ulcer, right ([place])   . Type II or unspecified type diabetes mellitus without mention of complication, uncontrolled 11/27/2013  . Acute respiratory failure with hypoxia ([place]) 11/26/2013  . CAP (community acquired pneumonia) 11/26/2013  . CHF exacerbation ([place]) 11/25/2013  . Cardiomyopathy, ischemic 06/13/2013  . At risk  for sudden cardiac death 06/16/13  . S/P cardiac catheterization, Rt & Lt heart cath 06/05/2013 06-16-2013  . Acute on chronic combined systolic and diastolic HF (heart failure), NYHA class 3 ([place]) 06/04/2013  . Atrial flutter with rapid ventricular response ([place]) 06/02/2013  . Acute renal failure: Cr up to 1.8 06/02/2013  . Hyperkalemia 06/02/2013    Class: Acute  . LBBB (left bundle branch block) 06/01/2013  . Hypotension- secondary to AF and Diltiazem Rx 06/01/2013  . Obesity (BMI 30-39.9) 06/01/2013  . Acute on chronic diastolic heart failure - due to Afib AVR 06/01/2013  . Atrial fibrillation with RVR- converted with Amiodarone 05/31/2013  . Stroke- Lt brain 2003 04/08/2013  . Hemiplegia affecting right dominant side ([place]) 04/08/2013  . Cataract 04/08/2013  . GERD (gastroesophageal reflux disease) 04/08/2013  . S/P CABG x 3 - 2010 04/08/2013  . S/P AVR-tissue, 2010 04/08/2013  . DM2 (diabetes mellitus, type 2) ([place]) 04/08/2013  . Dyslipidemia 04/08/2013  . SUBACROMIAL BURSITIS, LEFT 11/05/2009    Deniece Ree PT, DPT [facility] [address] [place], [name], [address] Phone: [phone]   Fax:  [phone]  Name: NITHILAN FREEMON MRN: [id number] Date of Birth: 27-Jul-1946

## 2016-03-10 ENCOUNTER — Ambulatory Visit (HOSPITAL_COMMUNITY): Payer: Medicare Other

## 2016-03-10 DIAGNOSIS — N183 Chronic kidney disease, stage 3 (moderate): Secondary | ICD-10-CM | POA: Diagnosis not present

## 2016-03-10 DIAGNOSIS — R262 Difficulty in walking, not elsewhere classified: Secondary | ICD-10-CM

## 2016-03-10 DIAGNOSIS — R2681 Unsteadiness on feet: Secondary | ICD-10-CM | POA: Diagnosis not present

## 2016-03-10 DIAGNOSIS — Z95818 Presence of other cardiac implants and grafts: Secondary | ICD-10-CM | POA: Diagnosis not present

## 2016-03-10 DIAGNOSIS — L97513 Non-pressure chronic ulcer of other part of right foot with necrosis of muscle: Secondary | ICD-10-CM | POA: Diagnosis not present

## 2016-03-10 DIAGNOSIS — S90931S Unspecified superficial injury of right great toe, sequela: Secondary | ICD-10-CM

## 2016-03-10 DIAGNOSIS — E669 Obesity, unspecified: Secondary | ICD-10-CM | POA: Diagnosis not present

## 2016-03-10 DIAGNOSIS — E039 Hypothyroidism, unspecified: Secondary | ICD-10-CM | POA: Diagnosis not present

## 2016-03-10 DIAGNOSIS — Z7901 Long term (current) use of anticoagulants: Secondary | ICD-10-CM | POA: Diagnosis not present

## 2016-03-10 DIAGNOSIS — Z7982 Long term (current) use of aspirin: Secondary | ICD-10-CM | POA: Diagnosis not present

## 2016-03-10 DIAGNOSIS — E785 Hyperlipidemia, unspecified: Secondary | ICD-10-CM | POA: Diagnosis not present

## 2016-03-10 DIAGNOSIS — I130 Hypertensive heart and chronic kidney disease with heart failure and stage 1 through stage 4 chronic kidney disease, or unspecified chronic kidney disease: Secondary | ICD-10-CM | POA: Diagnosis not present

## 2016-03-10 DIAGNOSIS — I5022 Chronic systolic (congestive) heart failure: Secondary | ICD-10-CM | POA: Diagnosis not present

## 2016-03-10 DIAGNOSIS — I480 Paroxysmal atrial fibrillation: Secondary | ICD-10-CM | POA: Diagnosis not present

## 2016-03-10 DIAGNOSIS — S91109D Unspecified open wound of unspecified toe(s) without damage to nail, subsequent encounter: Secondary | ICD-10-CM | POA: Diagnosis not present

## 2016-03-10 DIAGNOSIS — E1122 Type 2 diabetes mellitus with diabetic chronic kidney disease: Secondary | ICD-10-CM | POA: Diagnosis not present

## 2016-03-10 DIAGNOSIS — I69351 Hemiplegia and hemiparesis following cerebral infarction affecting right dominant side: Secondary | ICD-10-CM | POA: Diagnosis not present

## 2016-03-10 DIAGNOSIS — K219 Gastro-esophageal reflux disease without esophagitis: Secondary | ICD-10-CM | POA: Diagnosis not present

## 2016-03-10 DIAGNOSIS — I2510 Atherosclerotic heart disease of native coronary artery without angina pectoris: Secondary | ICD-10-CM | POA: Diagnosis not present

## 2016-03-10 DIAGNOSIS — S81001D Unspecified open wound, right knee, subsequent encounter: Secondary | ICD-10-CM | POA: Diagnosis not present

## 2016-03-10 DIAGNOSIS — Z794 Long term (current) use of insulin: Secondary | ICD-10-CM | POA: Diagnosis not present

## 2016-03-10 NOTE — Therapy (Signed)
[place], [name], [address] Phone: [phone]   Fax:  [phone]  Wound Care Therapy  Patient Details  Name: [name] MRN: [id number] Date of Birth: 20-May-1946 No Data Recorded  Encounter Date: 03/10/2016      PT End of Session - 03/10/16 1421    Visit Number 5   Number of Visits 8   Date for PT Re-Evaluation 03/22/16   Authorization Type UHC Medicare    Authorization Time Period 02/23/16 to 03/25/16   Authorization - Visit Number 5   Authorization - Number of Visits 10   PT Start Time [id number]   PT Stop Time 1415   PT Time Calculation (min) 20 min      Past Medical History:  Diagnosis Date  . At risk for sudden cardiac death 06-23-2013  . Cataract   . Chronic anticoagulation 05/2013   started  . Chronic kidney disease    Patient reports he is seeing Kidney doctor in September  . Coronary artery disease   . Diabetes mellitus    type 2   . Dyslipidemia   . GERD (gastroesophageal reflux disease)   . History of valve replacement 2010   [facility] Ease bioprosthetic 71mm  . Hypertension   . PAF (paroxysmal atrial fibrillation) ([place]) 05/2013   converted with amiodarone to SR  . S/P CABG x 3 2010  . S/P cardiac catheterization, Rt & Lt heart cath 06/05/2013 06-23-13  . Stroke [facility]) 2003   R-sided weakness & upper extremity swelling   . Thyroid disease     Past Surgical History:  Procedure Laterality Date  . AORTIC VALVE REPLACEMENT  01/26/2009   [facility] Ease bioprosthetic 32mm valve  . CARDIAC CATHETERIZATION  06/05/2013   native 3 vessel disease; patent LIMA to LAD, SVG to OM and SVG to PDA; Elevated right and left heart filling pressures, with predominantly pulmonary venous hypertension, normal PVR  . COLONOSCOPY  2007   Dr. Aviva Signs: internal hemorrhoids  . CORONARY ARTERY BYPASS GRAFT  01/26/2009   LIMA to LAD, SVG to circumflex, SVG to PDA  . GRAFT(S) ANGIOGRAM   06/05/2013   Procedure: GRAFT(S) Cyril Loosen;  Surgeon: Leonie Man, MD;  Location: [facility] CATH LAB;  Service: Cardiovascular;;  . LEFT AND RIGHT HEART CATHETERIZATION WITH CORONARY ANGIOGRAM N/A 06/05/2013   Procedure: LEFT AND RIGHT HEART CATHETERIZATION WITH CORONARY ANGIOGRAM;  Surgeon: Leonie Man, MD;  Location: [facility] CATH LAB;  Service: Cardiovascular;  Laterality: N/A;  . TRANSTHORACIC ECHOCARDIOGRAM  09/2011   grade 1 diastolic dysfunction; increasing valve gradient; calcified MV annulus   . TRANSTHORACIC ECHOCARDIOGRAM  06/03/2013   EF 25-30%, mod conc. hypertrophy, grade 3 diastolic dysfunction; LA mod dilated; calcified MV annulus; transaortic gradients are normal for the bioprosthetic valve; inf vena cava dilated (elevated CVP) - LifeVest    There were no vitals filed for this visit.       Subjective Assessment - 03/10/16 1414    Subjective Dressing intact, no reports of pain.     Pertinent History Patient reports that this wound started as a small little brown spot on the end of his toe that just kept getting bigger; he thinks it started about 8 months ago. He reports that it is now slowly getting better but has been healing slow. He states  that it has been getting better.    Currently in Pain? No/denies  Wound Therapy - 03/10/16 1415    Subjective Dressing intact, no reports of pain.     Patient and Family Stated Goals wound to heal    Pain Assessment No/denies pain   Wound Properties Date First Assessed: 02/23/16 Wound Type: Other (Comment) , open area R great toe   Location: Other (Comment) , Medial R great toe   Location Orientation: Right;Medial Present on Admission: Yes   Dressing Type Gauze (Comment);Silver hydrofiber   Dressing Changed Changed   Dressing Status Clean;Dry;Intact   Dressing Change Frequency PRN   Site / Wound Assessment Clean;Granulation tissue   % Wound base Red or Granulating 95%   % Wound base Other (Comment) 5%   Biofilm   Peri-wound Assessment Other (Comment)  callous   Margins Unattached edges (unapproximated)   Closure None   Drainage Amount Scant   Drainage Description No odor   Treatment Cleansed;Debridement (Selective)   Incision Properties Date First Assessed: 04/23/15 Time First Assessed: 1738 Location: Groin Location Orientation: Left Present on Admission: No   Selective Debridement - Location callous perimeter of wound   Selective Debridement - Tools Used Scalpel   Selective Debridement - Tissue Removed callous   Wound Therapy - Clinical Statement Wound continues to improve small amount of slough around edges and biofilm on center of wound.  Decreased callous perimeter of wound following debridement.  Continues with silver hydrofiber for drainage with medipore tape., may want to change to xeroform if drainage continues to reduce next session.  No reports of pain through session.  .   Wound Therapy - Functional Problem List dificulty walking, dificulty wearing shoes    Factors Delaying/Impairing Wound Healing Altered sensation;Diabetes Mellitus;Infection - systemic/local;Immobility;Multiple medical problems;Vascular compromise   Hydrotherapy Plan Debridement;Dressing change;Patient/family education   Wound Therapy - Frequency Other (comment)  2x/ week   Wound Therapy - Current Recommendations PT   Wound Plan Continued cleasing and  debridement; may alter dressings as needed if wound does not heal as anticipated                    PT Short Term Goals - 03/04/16 1637      PT SHORT TERM GOAL #1   Title Patient to demonstrate reduction of wound area by at least 50% on all measured dimensions in order to demonstrate improvement of condition    Time 2   Period Weeks   Status On-going     PT SHORT TERM GOAL #2   Title Patient will verbally be able to verbally state at least 3 methods of foot care/ways to prevent further wound formation in order to prevent wound recurrence     Time 2   Period Weeks   Status On-going           PT Long Term Goals - 03/04/16 1637      PT LONG TERM GOAL #1   Title Patient to be able to wear all pairs of shoes without exacerbation of R great toe in order to improve QOL and recurrence of wound    Time 4   Period Weeks   Status On-going     PT LONG TERM GOAL #2   Title Patient to display full wound closure in order to demonstrate resolution of condition    Time 4   Period Weeks   Status On-going             Patient will benefit from skilled therapeutic intervention in order to improve the following deficits and  impairments:     Visit Diagnosis: Unspecified superficial injury of right great toe, sequela  Difficulty in walking, not elsewhere classified  Unsteadiness on feet     Problem List Patient Active Problem List   Diagnosis Date Noted  . Nausea & vomiting 02/24/2016  . A-fib ([place]) 02/23/2016  . Hypothyroidism 02/23/2016  . CKD (chronic kidney disease), stage III 02/23/2016  . Anxiety 02/23/2016  . Chronic combined systolic (congestive) and diastolic (congestive) heart failure ([place]) 02/23/2016  . Dizziness 02/23/2016  . Open wound of right foot 02/23/2016  . Rectal bleeding 07/28/2015  . Chronic anticoagulation 07/28/2015  . PVD (peripheral vascular disease) ([place])   . S/P angioplasty 04/23/2015  . Peripheral arterial occlusive disease ([place]) 04/23/2015  . PAD (peripheral artery disease) ([place])   . Critical lower limb ischemia   . Toe ulcer, right ([place])   . Type II or unspecified type diabetes mellitus without mention of complication, uncontrolled 11/27/2013  . Acute respiratory failure with hypoxia ([place]) 11/26/2013  . CAP (community acquired pneumonia) 11/26/2013  . CHF exacerbation ([place]) 11/25/2013  . Cardiomyopathy, ischemic 06/13/2013  . At risk for sudden cardiac death 06/14/13  . S/P cardiac catheterization, Rt & Lt heart cath 06/05/2013 06-14-13  . Acute on chronic combined systolic  and diastolic HF (heart failure), NYHA class 3 ([place]) 06/04/2013  . Atrial flutter with rapid ventricular response ([place]) 06/02/2013  . Acute renal failure: Cr up to 1.8 06/02/2013  . Hyperkalemia 06/02/2013    Class: Acute  . LBBB (left bundle branch block) 06/01/2013  . Hypotension- secondary to AF and Diltiazem Rx 06/01/2013  . Obesity (BMI 30-39.9) 06/01/2013  . Acute on chronic diastolic heart failure - due to Afib AVR 06/01/2013  . Atrial fibrillation with RVR- converted with Amiodarone 05/31/2013  . Stroke- Lt brain 2003 04/08/2013  . Hemiplegia affecting right dominant side ([place]) 04/08/2013  . Cataract 04/08/2013  . GERD (gastroesophageal reflux disease) 04/08/2013  . S/P CABG x 3 - 2010 04/08/2013  . S/P AVR-tissue, 2010 04/08/2013  . DM2 (diabetes mellitus, type 2) ([place]) 04/08/2013  . Dyslipidemia 04/08/2013  . SUBACROMIAL BURSITIS, LEFT 11/05/2009   Ihor Austin, LPTA; CBIS [phone]  Aldona Lento 03/10/2016, 2:23 PM  [place], [name], [address] Phone: [phone]   Fax:  [phone]  Name: KAINE GILLYARD MRN: [id number] Date of Birth: [birth year]

## 2016-03-11 DIAGNOSIS — E785 Hyperlipidemia, unspecified: Secondary | ICD-10-CM | POA: Diagnosis not present

## 2016-03-11 DIAGNOSIS — S81001D Unspecified open wound, right knee, subsequent encounter: Secondary | ICD-10-CM | POA: Diagnosis not present

## 2016-03-11 DIAGNOSIS — K219 Gastro-esophageal reflux disease without esophagitis: Secondary | ICD-10-CM | POA: Diagnosis not present

## 2016-03-11 DIAGNOSIS — G894 Chronic pain syndrome: Secondary | ICD-10-CM | POA: Diagnosis not present

## 2016-03-11 DIAGNOSIS — Z95818 Presence of other cardiac implants and grafts: Secondary | ICD-10-CM | POA: Diagnosis not present

## 2016-03-11 DIAGNOSIS — E039 Hypothyroidism, unspecified: Secondary | ICD-10-CM | POA: Diagnosis not present

## 2016-03-11 DIAGNOSIS — Z794 Long term (current) use of insulin: Secondary | ICD-10-CM | POA: Diagnosis not present

## 2016-03-11 DIAGNOSIS — I10 Essential (primary) hypertension: Secondary | ICD-10-CM | POA: Diagnosis not present

## 2016-03-11 DIAGNOSIS — I480 Paroxysmal atrial fibrillation: Secondary | ICD-10-CM | POA: Diagnosis not present

## 2016-03-11 DIAGNOSIS — I130 Hypertensive heart and chronic kidney disease with heart failure and stage 1 through stage 4 chronic kidney disease, or unspecified chronic kidney disease: Secondary | ICD-10-CM | POA: Diagnosis not present

## 2016-03-11 DIAGNOSIS — I5022 Chronic systolic (congestive) heart failure: Secondary | ICD-10-CM | POA: Diagnosis not present

## 2016-03-11 DIAGNOSIS — Z7982 Long term (current) use of aspirin: Secondary | ICD-10-CM | POA: Diagnosis not present

## 2016-03-11 DIAGNOSIS — N183 Chronic kidney disease, stage 3 (moderate): Secondary | ICD-10-CM | POA: Diagnosis not present

## 2016-03-11 DIAGNOSIS — E669 Obesity, unspecified: Secondary | ICD-10-CM | POA: Diagnosis not present

## 2016-03-11 DIAGNOSIS — E1122 Type 2 diabetes mellitus with diabetic chronic kidney disease: Secondary | ICD-10-CM | POA: Diagnosis not present

## 2016-03-11 DIAGNOSIS — I2510 Atherosclerotic heart disease of native coronary artery without angina pectoris: Secondary | ICD-10-CM | POA: Diagnosis not present

## 2016-03-11 DIAGNOSIS — I69351 Hemiplegia and hemiparesis following cerebral infarction affecting right dominant side: Secondary | ICD-10-CM | POA: Diagnosis not present

## 2016-03-11 DIAGNOSIS — Z7901 Long term (current) use of anticoagulants: Secondary | ICD-10-CM | POA: Diagnosis not present

## 2016-03-11 DIAGNOSIS — S91109D Unspecified open wound of unspecified toe(s) without damage to nail, subsequent encounter: Secondary | ICD-10-CM | POA: Diagnosis not present

## 2016-03-11 DIAGNOSIS — Z1389 Encounter for screening for other disorder: Secondary | ICD-10-CM | POA: Diagnosis not present

## 2016-03-14 ENCOUNTER — Telehealth (HOSPITAL_COMMUNITY): Payer: Self-pay | Admitting: Physical Therapy

## 2016-03-14 ENCOUNTER — Ambulatory Visit (HOSPITAL_COMMUNITY): Payer: Medicare Other | Admitting: Physical Therapy

## 2016-03-14 DIAGNOSIS — S90931S Unspecified superficial injury of right great toe, sequela: Secondary | ICD-10-CM | POA: Diagnosis not present

## 2016-03-14 DIAGNOSIS — R2681 Unsteadiness on feet: Secondary | ICD-10-CM | POA: Diagnosis not present

## 2016-03-14 DIAGNOSIS — L97513 Non-pressure chronic ulcer of other part of right foot with necrosis of muscle: Secondary | ICD-10-CM

## 2016-03-14 DIAGNOSIS — R262 Difficulty in walking, not elsewhere classified: Secondary | ICD-10-CM

## 2016-03-14 NOTE — Telephone Encounter (Signed)
Patient a no-show for today's appt. Called and offered patient appointments at 9 and 9:45 this morning.  Deniece Ree PT, DPT [phone]

## 2016-03-14 NOTE — Therapy (Signed)
[place], [name], [address] Phone: [phone]   Fax:  [phone]  Wound Care Therapy  Patient Details  Name: [name] MRN: [id number] Date of Birth: 03/16/1946 No Data Recorded  Encounter Date: 03/14/2016      PT End of Session - 03/14/16 0917    Visit Number 6   Number of Visits 8   Date for PT Re-Evaluation 03/22/16   Authorization Type UHC Medicare    Authorization Time Period 02/23/16 to 03/25/16   Authorization - Visit Number 6   Authorization - Number of Visits 8   PT Start Time 0850   PT Stop Time 0912   PT Time Calculation (min) 22 min   Activity Tolerance Patient tolerated treatment well   Behavior During Therapy [place] for tasks assessed/performed      Past Medical History:  Diagnosis Date  . At risk for sudden cardiac death 06/14/2013  . Cataract   . Chronic anticoagulation 05/2013   started  . Chronic kidney disease    Patient reports he is seeing Kidney doctor in September  . Coronary artery disease   . Diabetes mellitus    type 2   . Dyslipidemia   . GERD (gastroesophageal reflux disease)   . History of valve replacement 2010   [facility] Ease bioprosthetic 32m  . Hypertension   . PAF (paroxysmal atrial fibrillation) ([place] 05/2013   converted with amiodarone to SR  . S/P CABG x 3 2010  . S/P cardiac catheterization, Rt & Lt heart cath 06/05/2013 110-31-2014 . Stroke ([facility] 2003   R-sided weakness & upper extremity swelling   . Thyroid disease     Past Surgical History:  Procedure Laterality Date  . AORTIC [facility]  [facility]26/2009   [facility]  . CARDIAC CATHETERIZATION  06/05/2013   native 3 vessel disease; patent LIMA to LAD, SVG to OM and SVG to PDA; Elevated right and left heart filling pressures, with predominantly pulmonary venous hypertension, normal PVR  . COLONOSCOPY  2007   Dr. MaAviva Signsinternal hemorrhoids  .  CORONARY ARTERY BYPASS GRAFT  01/26/2009   LIMA to LAD, SVG to circumflex, SVG to PDA  . GRAFT(S) ANGIOGRAM  06/05/2013   Procedure: GRAFT(S) ANCyril Loosen Surgeon: DaLeonie ManMD;  [facility];  Service: Cardiovascular;;  . LEFT AND RIGHT HEART CATHETERIZATION WITH CORONARY ANGIOGRAM N/A 06/05/2013   Procedure: LEFT AND RIGHT HEART CATHETERIZATION WITH CORONARY ANGIOGRAM;  Surgeon: DaLeonie ManMD;  [facility];  Service: Cardiovascular;  Laterality: N/A;  . TRANSTHORACIC ECHOCARDIOGRAM  09/2011   grade 1 diastolic dysfunction; increasing valve gradient; calcified MV annulus   . TRANSTHORACIC ECHOCARDIOGRAM  06/03/2013   EF 25-30%, mod conc. hypertrophy, grade 3 diastolic dysfunction; LA mod dilated; calcified MV annulus; transaortic gradients are normal for the bioprosthetic valve; inf vena cava dilated (elevated CVP) - LifeVest    There were no vitals filed for this visit.                  Wound Therapy - 03/14/16 0907    Subjective Pt states he thought his appointment was at 9:00; able to see pt even though his appointment was at 8:15 due to cancellation.    Patient and Family Stated Goals wound to heal    Date of Onset 02/18/13  approximate date   Pain Assessment No/denies pain   Wound Properties Date  First Assessed: 02/23/16 Wound Type: Other (Comment) , open area R great toe   Location: Other (Comment) , Medial R great toe   Location Orientation: Right;Medial Present on Admission: Yes   Dressing Type Gauze (Comment);Silver hydrofiber   Dressing Changed Changed   Dressing Status Clean;Dry;Intact   Dressing Change Frequency PRN   Site / Wound Assessment Clean;Granulation tissue   % Wound base Red or Granulating 100%   % Wound base Yellow 0%   % Wound base Other (Comment) --  Biofilm   Peri-wound Assessment Other (Comment)  callous   Wound Length (cm) 0.6 cm  was 1.0   Wound Width (cm) 0.4 cm  was .9   Wound Depth (cm) 0 cm   Undermining  (cm) --  none now was .3 distally with .1 all other edges    Margins Unattached edges (unapproximated)   Closure None   Drainage Amount Scant   Drainage Description No odor   Treatment Cleansed;Other (Comment)  self care.    Incision Properties Date First Assessed: 04/23/15 Time First Assessed: 1738 Location: Groin Location Orientation: Left Present on Admission: No   Selective Debridement - Location --   Selective Debridement - Tools Used --   Selective Debridement - Tissue Removed --   Wound Therapy - Clinical Statement Minimal callous noted.  Wound has improved in granulation, size and decreased in undermining; but has started hypergranulation.    Anticipate pt to be discharged next week    Wound Therapy - Functional Problem List dificulty walking, dificulty wearing shoes    Factors Delaying/Impairing Wound Healing Altered sensation;Diabetes Mellitus;Infection - systemic/local;Immobility;Multiple medical problems;Vascular compromise   Hydrotherapy Plan Debridement;Dressing change;Patient/family education   Wound Therapy - Frequency Other (comment)  2x/ week   Wound Therapy - Current Recommendations PT   Wound Plan Continued cleasing and  debridement,(if needed); may alter dressings as needed if wound does not heal as anticipated    Dressing  xeroform with vasiline to periphery; medipore tape to decrease hypergranulation tissue.                  PT Education - 03/14/16 0916    Education provided Yes   Education Details Keep dressing dry, if it gets wet change dressing with increased pressue on wound. Do not wear shoes that will rub on wound site.   Person(s) Educated Patient   Methods Explanation   Comprehension Verbalized understanding          PT Short Term Goals - 03/14/16 0922      PT SHORT TERM GOAL #1   Title Patient to demonstrate reduction of wound area by at least 50% on all measured dimensions in order to demonstrate improvement of condition    Time 2    Period Weeks   Status Partially Met     PT SHORT TERM GOAL #2   Title Patient will verbally be able to verbally state at least 3 methods of foot care/ways to prevent further wound formation in order to prevent wound recurrence    Time 2   Period Weeks   Status Achieved           PT Long Term Goals - 03/14/16 9417      PT LONG TERM GOAL #1   Title Patient to be able to wear all pairs of shoes without exacerbation of R great toe in order to improve QOL and recurrence of wound    Time 4   Period Weeks   Status On-going  PT LONG TERM GOAL #2   Title Patient to display full wound closure in order to demonstrate resolution of condition    Time 4   Period Weeks   Status On-going               Plan - 03/14/16 2841    Clinical Impression Statement see above   Rehab Potential Good   PT Treatment/Interventions Other (comment);Patient/family education   PT Next Visit Plan continue with dressing and self care.  Pt would benefit from out-patient therapy to improve activity tolerance as he notes that since hospital discharge he has become fatigued with minimal activity.       Patient will benefit from skilled therapeutic intervention in order to improve the following deficits and impairments:  Impaired sensation, Other (comment), Difficulty walking  Visit Diagnosis: Unspecified superficial injury of right great toe, sequela  Difficulty in walking, not elsewhere classified  Unsteadiness on feet  Non-pressure chronic ulcer of other part of right foot with necrosis of muscle ([place])     Problem List Patient Active Problem List   Diagnosis Date Noted  . Nausea & vomiting 02/24/2016  . A-fib ([place]) 02/23/2016  . Hypothyroidism 02/23/2016  . CKD (chronic kidney disease), stage III 02/23/2016  . Anxiety 02/23/2016  . Chronic combined systolic (congestive) and diastolic (congestive) heart failure ([place]) 02/23/2016  . Dizziness 02/23/2016  . Open wound of right foot  02/23/2016  . Rectal bleeding 07/28/2015  . Chronic anticoagulation 07/28/2015  . PVD (peripheral vascular disease) ([place])   . S/P angioplasty 04/23/2015  . Peripheral arterial occlusive disease ([place]) 04/23/2015  . PAD (peripheral artery disease) ([place])   . Critical lower limb ischemia   . Toe ulcer, right ([place])   . Type II or unspecified type diabetes mellitus without mention of complication, uncontrolled 11/27/2013  . Acute respiratory failure with hypoxia ([place]) 11/26/2013  . CAP (community acquired pneumonia) 11/26/2013  . CHF exacerbation ([place]) 11/25/2013  . Cardiomyopathy, ischemic 06/13/2013  . At risk for sudden cardiac death 2013/07/04  . S/P cardiac catheterization, Rt & Lt heart cath 06/05/2013 07-04-13  . Acute on chronic combined systolic and diastolic HF (heart failure), NYHA class 3 ([place]) 06/04/2013  . Atrial flutter with rapid ventricular response ([place]) 06/02/2013  . Acute renal failure: Cr up to 1.8 06/02/2013  . Hyperkalemia 06/02/2013    Class: Acute  . LBBB (left bundle branch block) 06/01/2013  . Hypotension- secondary to AF and Diltiazem Rx 06/01/2013  . Obesity (BMI 30-39.9) 06/01/2013  . Acute on chronic diastolic heart failure - due to Afib AVR 06/01/2013  . Atrial fibrillation with RVR- converted with Amiodarone 05/31/2013  . Stroke- Lt brain 2003 04/08/2013  . Hemiplegia affecting right dominant side ([place]) 04/08/2013  . Cataract 04/08/2013  . GERD (gastroesophageal reflux disease) 04/08/2013  . S/P CABG x 3 - 2010 04/08/2013  . S/P AVR-tissue, 2010 04/08/2013  . DM2 (diabetes mellitus, type 2) ([place]) 04/08/2013  . Dyslipidemia 04/08/2013  . SUBACROMIAL BURSITIS, LEFT 11/05/2009    Rayetta Humphrey, PT CLT [phone] 03/14/2016, 9:24 AM  [place] [address] [place], [name], [address] Phone: [phone]   Fax:  [phone]  Name: [name] MRN: [id number] Date of Birth: 07-Nov-1945

## 2016-03-15 DIAGNOSIS — S91109D Unspecified open wound of unspecified toe(s) without damage to nail, subsequent encounter: Secondary | ICD-10-CM | POA: Diagnosis not present

## 2016-03-15 DIAGNOSIS — E1122 Type 2 diabetes mellitus with diabetic chronic kidney disease: Secondary | ICD-10-CM | POA: Diagnosis not present

## 2016-03-15 DIAGNOSIS — E669 Obesity, unspecified: Secondary | ICD-10-CM | POA: Diagnosis not present

## 2016-03-15 DIAGNOSIS — I5022 Chronic systolic (congestive) heart failure: Secondary | ICD-10-CM | POA: Diagnosis not present

## 2016-03-15 DIAGNOSIS — K219 Gastro-esophageal reflux disease without esophagitis: Secondary | ICD-10-CM | POA: Diagnosis not present

## 2016-03-15 DIAGNOSIS — I69351 Hemiplegia and hemiparesis following cerebral infarction affecting right dominant side: Secondary | ICD-10-CM | POA: Diagnosis not present

## 2016-03-15 DIAGNOSIS — E785 Hyperlipidemia, unspecified: Secondary | ICD-10-CM | POA: Diagnosis not present

## 2016-03-15 DIAGNOSIS — N183 Chronic kidney disease, stage 3 (moderate): Secondary | ICD-10-CM | POA: Diagnosis not present

## 2016-03-15 DIAGNOSIS — Z794 Long term (current) use of insulin: Secondary | ICD-10-CM | POA: Diagnosis not present

## 2016-03-15 DIAGNOSIS — Z7901 Long term (current) use of anticoagulants: Secondary | ICD-10-CM | POA: Diagnosis not present

## 2016-03-15 DIAGNOSIS — S81001D Unspecified open wound, right knee, subsequent encounter: Secondary | ICD-10-CM | POA: Diagnosis not present

## 2016-03-15 DIAGNOSIS — I130 Hypertensive heart and chronic kidney disease with heart failure and stage 1 through stage 4 chronic kidney disease, or unspecified chronic kidney disease: Secondary | ICD-10-CM | POA: Diagnosis not present

## 2016-03-15 DIAGNOSIS — Z95818 Presence of other cardiac implants and grafts: Secondary | ICD-10-CM | POA: Diagnosis not present

## 2016-03-15 DIAGNOSIS — I2510 Atherosclerotic heart disease of native coronary artery without angina pectoris: Secondary | ICD-10-CM | POA: Diagnosis not present

## 2016-03-15 DIAGNOSIS — E039 Hypothyroidism, unspecified: Secondary | ICD-10-CM | POA: Diagnosis not present

## 2016-03-15 DIAGNOSIS — I480 Paroxysmal atrial fibrillation: Secondary | ICD-10-CM | POA: Diagnosis not present

## 2016-03-15 DIAGNOSIS — Z7982 Long term (current) use of aspirin: Secondary | ICD-10-CM | POA: Diagnosis not present

## 2016-03-16 DIAGNOSIS — I2510 Atherosclerotic heart disease of native coronary artery without angina pectoris: Secondary | ICD-10-CM | POA: Diagnosis not present

## 2016-03-16 DIAGNOSIS — Z794 Long term (current) use of insulin: Secondary | ICD-10-CM | POA: Diagnosis not present

## 2016-03-16 DIAGNOSIS — I130 Hypertensive heart and chronic kidney disease with heart failure and stage 1 through stage 4 chronic kidney disease, or unspecified chronic kidney disease: Secondary | ICD-10-CM | POA: Diagnosis not present

## 2016-03-16 DIAGNOSIS — Z95818 Presence of other cardiac implants and grafts: Secondary | ICD-10-CM | POA: Diagnosis not present

## 2016-03-16 DIAGNOSIS — S81001D Unspecified open wound, right knee, subsequent encounter: Secondary | ICD-10-CM | POA: Diagnosis not present

## 2016-03-16 DIAGNOSIS — E1122 Type 2 diabetes mellitus with diabetic chronic kidney disease: Secondary | ICD-10-CM | POA: Diagnosis not present

## 2016-03-16 DIAGNOSIS — N183 Chronic kidney disease, stage 3 (moderate): Secondary | ICD-10-CM | POA: Diagnosis not present

## 2016-03-16 DIAGNOSIS — K219 Gastro-esophageal reflux disease without esophagitis: Secondary | ICD-10-CM | POA: Diagnosis not present

## 2016-03-16 DIAGNOSIS — I5022 Chronic systolic (congestive) heart failure: Secondary | ICD-10-CM | POA: Diagnosis not present

## 2016-03-16 DIAGNOSIS — S91109D Unspecified open wound of unspecified toe(s) without damage to nail, subsequent encounter: Secondary | ICD-10-CM | POA: Diagnosis not present

## 2016-03-16 DIAGNOSIS — Z7901 Long term (current) use of anticoagulants: Secondary | ICD-10-CM | POA: Diagnosis not present

## 2016-03-16 DIAGNOSIS — E669 Obesity, unspecified: Secondary | ICD-10-CM | POA: Diagnosis not present

## 2016-03-16 DIAGNOSIS — E039 Hypothyroidism, unspecified: Secondary | ICD-10-CM | POA: Diagnosis not present

## 2016-03-16 DIAGNOSIS — E785 Hyperlipidemia, unspecified: Secondary | ICD-10-CM | POA: Diagnosis not present

## 2016-03-16 DIAGNOSIS — I480 Paroxysmal atrial fibrillation: Secondary | ICD-10-CM | POA: Diagnosis not present

## 2016-03-16 DIAGNOSIS — Z7982 Long term (current) use of aspirin: Secondary | ICD-10-CM | POA: Diagnosis not present

## 2016-03-16 DIAGNOSIS — I69351 Hemiplegia and hemiparesis following cerebral infarction affecting right dominant side: Secondary | ICD-10-CM | POA: Diagnosis not present

## 2016-03-17 ENCOUNTER — Ambulatory Visit (HOSPITAL_COMMUNITY): Payer: Medicare Other | Attending: Orthopaedic Surgery | Admitting: Physical Therapy

## 2016-03-17 DIAGNOSIS — L97513 Non-pressure chronic ulcer of other part of right foot with necrosis of muscle: Secondary | ICD-10-CM | POA: Insufficient documentation

## 2016-03-17 DIAGNOSIS — R262 Difficulty in walking, not elsewhere classified: Secondary | ICD-10-CM | POA: Diagnosis not present

## 2016-03-17 DIAGNOSIS — S90931S Unspecified superficial injury of right great toe, sequela: Secondary | ICD-10-CM

## 2016-03-17 DIAGNOSIS — R2681 Unsteadiness on feet: Secondary | ICD-10-CM | POA: Diagnosis not present

## 2016-03-17 NOTE — Therapy (Signed)
[name], [name], [address] Phone: [phone]   Fax:  [phone]  Wound Care Therapy  Patient Details  Name: [name] MRN: [id number] Date of Birth: [birth year] No Data Recorded  Encounter Date: 03/17/2016      PT End of Session - 03/17/16 0949    Visit Number 6   Number of Visits 8   Date for PT Re-Evaluation 03/22/16   Authorization Type UHC Medicare    Authorization Time Period 02/23/16 to 03/25/16   Authorization - Visit Number 6   Authorization - Number of Visits 10   PT Start Time 0818   PT Stop Time 0840   PT Time Calculation (min) 22 min      Past Medical History:  Diagnosis Date  . At risk for sudden cardiac death 06-28-13  . Cataract   . Chronic anticoagulation 05/2013   started  . Chronic kidney disease    Patient reports he is seeing Kidney doctor in September  . Coronary artery disease   . Diabetes mellitus    type 2   . Dyslipidemia   . GERD (gastroesophageal reflux disease)   . History of valve replacement 2010   [facility] Ease bioprosthetic 40m  . Hypertension   . PA[place] ([place]ox[place]mal atrial fibrillation) ([place]   converted with amiodarone to SR  . S/P CABG x 3 2010  . S/P cardiac catheterization, Rt & Lt heart cath 06/05/2013 1Nov 14, 2014 . Stroke ([facility] 2003   R-sided weakness & upper extremity swelling   . Thyroid disease     Past Surgical History:  Procedure Laterality Date  . AORTIC VALVE REPLACEMENT  [facility]/26/2009   [facility]  . CARDIAC CATHETERIZATION  06/05/2013   native 3 vessel disease; patent LIMA to LAD, SVG to OM and SVG to PDA; Elevated right and left heart filling pressures, with predominantly pulmonary venous hypertension, normal PVR  . COLONOSCOPY  2007   Dr. MaAviva Signsinternal hemorrhoids  . CORONARY ARTERY BYPASS GRAFT  01/26/2009   LIMA to LAD, SVG to circumflex, SVG to PDA  . GRAFT(S) ANGIOGRAM   06/05/2013   Procedure: GRAFT(S) ANCyril Loosen Surgeon: DaLeonie ManMD;  [facility];  Service: Cardiovascular;;  . LEFT AND RIGHT HEART CATHETERIZATION WITH CORONARY ANGIOGRAM N/A 06/05/2013   Procedure: LEFT AND RIGHT HEART CATHETERIZATION WITH CORONARY ANGIOGRAM;  Surgeon: DaLeonie ManMD;  [facility];  Service: Cardiovascular;  Laterality: N/A;  . TRANSTHORACIC ECHOCARDIOGRAM  09/2011   grade 1 diastolic dysfunction; increasing valve gradient; calcified MV annulus   . TRANSTHORACIC ECHOCARDIOGRAM  06/03/2013   EF 25-30%, mod conc. hypertrophy, grade 3 diastolic dysfunction; LA mod dilated; calcified MV annulus; transaortic gradients are normal for the bioprosthetic valve; inf vena cava dilated (elevated CVP) - LifeVest    There were no vitals filed for this visit.                  Wound Therapy - 03/17/16 0940    Subjective Pt reports he is doing well  feels he is walking better and having no pain in his toe.   Patient and Family Stated Goals wound to heal    Date of Onset 02/18/13  approximate date   Wound Properties Date First Assessed: 02/23/16 Wound Type: Other (Comment) , open area R great toe   Location: Other (Comment) , Medial R great toe   Location Orientation: Right;Medial  Present on Admission: Yes   Dressing Type Gauze (Comment);Silver hydrofiber   Dressing Changed Changed   Dressing Status Clean;Dry;Intact   Dressing Change Frequency PRN   Site / Wound Assessment Clean;Granulation tissue   % Wound base Red or Granulating 100%   % Wound base Yellow 0%   % Wound base Other (Comment) --  Biofilm   Peri-wound Assessment Other (Comment)  callous   Margins Unattached edges (unapproximated)   Closure None   Drainage Amount Scant   Drainage Description No odor   Treatment Cleansed   Incision Properties Date First Assessed: 04/23/15 Time First Assessed: 1738 Location: Groin Location Orientation: Left Present on Admission: No   Wound  Therapy - Clinical Statement continued improvment and approximation.  continues to be 100% granulatied with only cleansing needed.  continued with xeroform, 2X2 and medipore.  #1 netting to secure dressing.   Wound Therapy - Functional Problem List dificulty walking, dificulty wearing shoes    Factors Delaying/Impairing Wound Healing Altered sensation;Diabetes Mellitus;Infection - systemic/local;Immobility;Multiple medical problems;Vascular compromise   Hydrotherapy Plan Debridement;Dressing change;Patient/family education   Wound Therapy - Frequency Other (comment)  2x/ week   Wound Therapy - Current Recommendations PT   Wound Plan Continued cleasing and  debridement,(if needed); may alter dressings as needed if wound does not heal as anticipated    Dressing  xeroform with vasiline to periphery; medipore tape to decrease hypergranulation tissue.                    PT Short Term Goals - 03/14/16 9798      PT SHORT TERM GOAL #1   Title Patient to demonstrate reduction of wound area by at least 50% on all measured dimensions in order to demonstrate improvement of condition    Time 2   Period Weeks   Status Partially Met     PT SHORT TERM GOAL #2   Title Patient will verbally be able to verbally state at least 3 methods of foot care/ways to prevent further wound formation in order to prevent wound recurrence    Time 2   Period Weeks   Status Achieved           PT Long Term Goals - 03/14/16 9211      PT LONG TERM GOAL #1   Title Patient to be able to wear all pairs of shoes without exacerbation of R great toe in order to improve QOL and recurrence of wound    Time 4   Period Weeks   Status On-going     PT LONG TERM GOAL #2   Title Patient to display full wound closure in order to demonstrate resolution of condition    Time 4   Period Weeks   Status On-going             Patient will benefit from skilled therapeutic intervention in order to improve the  following deficits and impairments:     Visit Diagnosis: Unspecified superficial injury of right great toe, sequela  Difficulty in walking, not elsewhere classified     Problem List Patient Active Problem List   Diagnosis Date Noted  . Nausea & vomiting 02/24/2016  . A-fib ([place]) 02/23/2016  . Hypothyroidism 02/23/2016  . CKD (chronic kidney disease), stage III 02/23/2016  . Anxiety 02/23/2016  . Chronic combined systolic (congestive) and diastolic (congestive) heart failure ([place]) 02/23/2016  . Dizziness 02/23/2016  . Open wound of right foot 02/23/2016  . Rectal bleeding 07/28/2015  . Chronic  anticoagulation 07/28/2015  . PVD (peripheral vascular disease) ([place])   . S/P angioplasty 04/23/2015  . Peripheral arterial occlusive disease ([place]) 04/23/2015  . PAD (peripheral artery disease) ([place])   . Critical lower limb ischemia   . Toe ulcer, right ([place])   . Type II or unspecified type diabetes mellitus without mention of complication, uncontrolled 11/27/2013  . Acute respiratory failure with hypoxia ([place]) 11/26/2013  . CAP (community acquired pneumonia) 11/26/2013  . CHF exacerbation ([place]) 11/25/2013  . Cardiomyopathy, ischemic 06/13/2013  . At risk for sudden cardiac death 06-12-13  . S/P cardiac catheterization, Rt & Lt heart cath 06/05/2013 06-12-2013  . Acute on chronic combined systolic and diastolic HF (heart failure), NYHA class 3 ([place]) 06/04/2013  . Atrial flutter with rapid ventricular response ([place]) 06/02/2013  . Acute renal failure: Cr up to 1.8 06/02/2013  . Hyperkalemia 06/02/2013    Class: Acute  . LBBB (left bundle branch block) 06/01/2013  . Hypotension- secondary to AF and Diltiazem Rx 06/01/2013  . Obesity (BMI 30-39.9) 06/01/2013  . Acute on chronic diastolic heart failure - due to Afib AVR 06/01/2013  . Atrial fibrillation with RVR- converted with Amiodarone 05/31/2013  . Stroke- Lt brain 2003 04/08/2013  . Hemiplegia affecting right dominant side ([place])  04/08/2013  . Cataract 04/08/2013  . GERD (gastroesophageal reflux disease) 04/08/2013  . S/P CABG x 3 - 2010 04/08/2013  . S/P AVR-tissue, 2010 04/08/2013  . DM2 (diabetes mellitus, type 2) ([place]) 04/08/2013  . Dyslipidemia 04/08/2013  . SUBACROMIAL BURSITIS, LEFT 11/05/2009    Teena Irani, PTA/CLT [phone]  03/17/2016, 9:51 AM  [facility] [address] [place], [name], [address] Phone: [phone]   Fax:  [phone]  Name: [name] MRN: [id number] Date of Birth: [birth year]

## 2016-03-21 ENCOUNTER — Ambulatory Visit (HOSPITAL_COMMUNITY): Payer: Medicare Other | Admitting: Physical Therapy

## 2016-03-22 ENCOUNTER — Ambulatory Visit (HOSPITAL_COMMUNITY): Payer: Medicare Other | Admitting: Physical Therapy

## 2016-03-22 ENCOUNTER — Encounter (INDEPENDENT_AMBULATORY_CARE_PROVIDER_SITE_OTHER): Payer: Self-pay | Admitting: Internal Medicine

## 2016-03-24 ENCOUNTER — Ambulatory Visit (HOSPITAL_COMMUNITY): Payer: Medicare Other | Admitting: Physical Therapy

## 2016-03-24 DIAGNOSIS — R262 Difficulty in walking, not elsewhere classified: Secondary | ICD-10-CM

## 2016-03-24 DIAGNOSIS — L97513 Non-pressure chronic ulcer of other part of right foot with necrosis of muscle: Secondary | ICD-10-CM | POA: Diagnosis not present

## 2016-03-24 DIAGNOSIS — R2681 Unsteadiness on feet: Secondary | ICD-10-CM | POA: Diagnosis not present

## 2016-03-24 DIAGNOSIS — S90931S Unspecified superficial injury of right great toe, sequela: Secondary | ICD-10-CM

## 2016-03-24 NOTE — Therapy (Signed)
[place], [name], [address] Phone: [phone]   Fax:  [phone]  Wound Care Therapy  Patient Details  Name: [name] MRN: [id number] Date of Birth: [birth year] No Data Recorded  Encounter Date: 03/24/2016      PT End of Session - 03/24/16 1026    Visit Number 7   Number of Visits 8   Date for PT Re-Evaluation 03/22/16   Authorization Type UHC Medicare    Authorization Time Period 02/23/16 to 03/25/16   Authorization - Visit Number 7   Authorization - Number of Visits 8   PT Start Time 0830   PT Stop Time 5947   PT Time Calculation (min) 25 min   Activity Tolerance Patient tolerated treatment well   Behavior During Therapy [facility] for tasks assessed/performed      Past Medical History:  Diagnosis Date  . At risk for sudden cardiac death 06-25-13  . Cataract   . Chronic anticoagulation 05/2013   started  . Chronic kidney disease    Patient reports he is seeing Kidney doctor in September  . Coronary artery disease   . Diabetes mellitus    type 2   . Dyslipidemia   . GERD (gastroesophageal reflux disease)   . History of valve replacement 2010   [facility] Ease bioprosthetic 50m  . Hypertension   . PAF (paroxysmal atrial fibrillation) ([place] 05/2013   converted with amiodarone to SR  . S/P CABG x 3 2010  . S/P cardiac catheterization, Rt & Lt heart cath 06/05/2013 111/06/2013 . Stroke ([facility] 2003   R-sided weakness & upper extremity swelling   . Thyroid disease     Past Surgical History:  Procedure Laterality Date  . AORTIC VALVE [facility]ACEMENT  [facility]/26/2009   [facility]  . CARDIAC CATHETERIZATION  06/05/2013   native 3 vessel disease; patent LIMA to LAD, SVG to OM and SVG to PDA; Elevated right and left heart filling pressures, with predominantly pulmonary venous hypertension, normal PVR  . COLONOSCOPY  2007   Dr. MaAviva Signsinternal hemorrhoids  .  CORONARY ARTERY BYPASS GRAFT  01/26/2009   LIMA to LAD, SVG to circumflex, SVG to PDA  . GRAFT(S) ANGIOGRAM  06/05/2013   Procedure: GRAFT(S) ANCyril Loosen Surgeon: DaLeonie ManMD;  [facility];  Service: Cardiovascular;;  . LEFT AND RIGHT HEART CATHETERIZATION WITH CORONARY ANGIOGRAM N/A 06/05/2013   Procedure: LEFT AND RIGHT HEART CATHETERIZATION WITH CORONARY ANGIOGRAM;  Surgeon: DaLeonie ManMD;  [facility];  Service: Cardiovascular;  Laterality: N/A;  . TRANSTHORACIC ECHOCARDIOGRAM  09/2011   grade 1 diastolic dysfunction; increasing valve gradient; calcified MV annulus   . TRANSTHORACIC ECHOCARDIOGRAM  06/03/2013   EF 25-30%, mod conc. hypertrophy, grade 3 diastolic dysfunction; LA mod dilated; calcified MV annulus; transaortic gradients are normal for the bioprosthetic valve; inf vena cava dilated (elevated CVP) - LifeVest    There were no vitals filed for this visit.                  Wound Therapy - 03/24/16 1020    Subjective [place].  No pain.   Patient and Family Stated Goals wound to heal    Date of Onset 02/18/13  approximate date   Wound Properties Date First Assessed: 02/23/16 Wound Type: Other (Comment) , open area R great  toe   Location: Other (Comment) , Medial R great toe   Location Orientation: Right;Medial Present on Admission: Yes   Dressing Type Gauze (Comment);Silver hydrofiber   Dressing Changed Changed   Dressing Status Clean;Dry;Intact   Dressing Change Frequency PRN   Site / Wound Assessment Clean;Granulation tissue   % Wound base Red or Granulating 100%   % Wound base Yellow 0%   % Wound base Other (Comment) --  Biofilm   Peri-wound Assessment Other (Comment)  callous   Wound Length (cm) 0.4 cm  was 0.6cm   Wound Width (cm) 0.3 cm  was 0.4cm   Margins Attached edges (approximated)   Closure None   Drainage Amount Scant   Drainage Description  Serosanguineous   Treatment Cleansed;Debridement (Selective)   Incision Properties Date First Assessed: 04/23/15 Time First Assessed: 1738 Location: Groin Location Orientation: Left Present on Admission: No   Wound Therapy - Clinical Statement Signed order received for strengthening evaluation.  Continued improvment and approximation.  continues to be 100% granulatied with debridement needed to remove callous and dry skin around the perimeter.  Continued with xeroform, 2X2 and medipore.  #1 netting to secure dressing.   Wound Therapy - Functional Problem List dificulty walking, dificulty wearing shoes    Factors Delaying/Impairing Wound Healing Altered sensation;Diabetes Mellitus;Infection - systemic/local;Immobility;Multiple medical problems;Vascular compromise   Hydrotherapy Plan Debridement;Dressing change;Patient/family education   Wound Therapy - Frequency Other (comment)  2x/ week   Wound Therapy - Current Recommendations PT   Wound Plan Continued cleansing and  debridement,(if needed); may alter dressings as needed if wound does not heal as anticipated.  will need an evaluation for strengthening.   Dressing  xeroform with vasiline to periphery; medipore tape to decrease hypergranulation tissue.                    PT Short Term Goals - 03/24/16 1055      PT SHORT TERM GOAL #1   Title Patient to demonstrate reduction of wound area by at least 50% on all measured dimensions in order to demonstrate improvement of condition    Time 2   Period Weeks   Status Partially Met     PT SHORT TERM GOAL #2   Title Patient will verbally be able to verbally state at least 3 methods of foot care/ways to prevent further wound formation in order to prevent wound recurrence    Time 2   Period Weeks   Status Achieved           PT Long Term Goals - 03/24/16 1056      PT LONG TERM GOAL #1   Title Patient to be able to wear all pairs of shoes without exacerbation of R great toe in order  to improve QOL and recurrence of wound    Time 4   Period Weeks   Status On-going     PT LONG TERM GOAL #2   Title Patient to display full wound closure in order to demonstrate resolution of condition    Time 4   Period Weeks   Status On-going      G code  PT other current:  [id number] CJ PT  Other Goal  :   [phone] CI        Patient will benefit from skilled therapeutic intervention in order to improve the following deficits and impairments:     Visit Diagnosis: Unspecified superficial injury of right great toe, sequela  Difficulty in walking, not elsewhere classified  Unsteadiness on feet     Problem List Patient Active Problem List   Diagnosis Date Noted  . Nausea & vomiting 02/24/2016  . A-fib ([place]) 02/23/2016  . Hypothyroidism 02/23/2016  . CKD (chronic kidney disease), stage III 02/23/2016  . Anxiety 02/23/2016  . Chronic combined systolic (congestive) and diastolic (congestive) heart failure ([place]) 02/23/2016  . Dizziness 02/23/2016  . Open wound of right foot 02/23/2016  . Rectal bleeding 07/28/2015  . Chronic anticoagulation 07/28/2015  . PVD (peripheral vascular disease) ([place])   . S/P angioplasty 04/23/2015  . Peripheral arterial occlusive disease ([place]) 04/23/2015  . PAD (peripheral artery disease) ([place])   . Critical lower limb ischemia   . Toe ulcer, right ([place])   . Type II or unspecified type diabetes mellitus without mention of complication, uncontrolled 11/27/2013  . Acute respiratory failure with hypoxia ([place]) 11/26/2013  . CAP (community acquired pneumonia) 11/26/2013  . CHF exacerbation ([place]) 11/25/2013  . Cardiomyopathy, ischemic 06/13/2013  . At risk for sudden cardiac death 05-Jul-2013  . S/P cardiac catheterization, Rt & Lt heart cath 06/05/2013 07-05-2013  . Acute on chronic combined systolic and diastolic HF (heart failure), NYHA class 3 ([place]) 06/04/2013  . Atrial flutter with rapid ventricular response ([place]) 06/02/2013  . Acute renal  failure: Cr up to 1.8 06/02/2013  . Hyperkalemia 06/02/2013    Class: Acute  . LBBB (left bundle branch block) 06/01/2013  . Hypotension- secondary to AF and Diltiazem Rx 06/01/2013  . Obesity (BMI 30-39.9) 06/01/2013  . Acute on chronic diastolic heart failure - due to Afib AVR 06/01/2013  . Atrial fibrillation with RVR- converted with Amiodarone 05/31/2013  . Stroke- Lt brain 2003 04/08/2013  . Hemiplegia affecting right dominant side ([place]) 04/08/2013  . Cataract 04/08/2013  . GERD (gastroesophageal reflux disease) 04/08/2013  . S/P CABG x 3 - 2010 04/08/2013  . S/P AVR-tissue, 2010 04/08/2013  . DM2 (diabetes mellitus, type 2) ([place]) 04/08/2013  . Dyslipidemia 04/08/2013  . SUBACROMIAL BURSITIS, LEFT 11/05/2009    Teena Irani, PTA/CLT [phone]  03/24/2016, 10:57 AM  [place] [address] [place], [name], [address] Phone: [phone]   Fax:  [phone]  Name: NASIM HABEEB MRN: [id number] Date of Birth: 02-28-1946

## 2016-03-28 DIAGNOSIS — I70235 Atherosclerosis of native arteries of right leg with ulceration of other part of foot: Secondary | ICD-10-CM | POA: Diagnosis not present

## 2016-03-28 DIAGNOSIS — E1140 Type 2 diabetes mellitus with diabetic neuropathy, unspecified: Secondary | ICD-10-CM | POA: Diagnosis not present

## 2016-03-29 ENCOUNTER — Ambulatory Visit (HOSPITAL_COMMUNITY): Payer: Medicare Other | Admitting: Physical Therapy

## 2016-03-29 DIAGNOSIS — L97513 Non-pressure chronic ulcer of other part of right foot with necrosis of muscle: Secondary | ICD-10-CM | POA: Diagnosis not present

## 2016-03-29 DIAGNOSIS — R262 Difficulty in walking, not elsewhere classified: Secondary | ICD-10-CM

## 2016-03-29 DIAGNOSIS — S90931S Unspecified superficial injury of right great toe, sequela: Secondary | ICD-10-CM | POA: Diagnosis not present

## 2016-03-29 DIAGNOSIS — R2681 Unsteadiness on feet: Secondary | ICD-10-CM | POA: Diagnosis not present

## 2016-03-29 NOTE — Therapy (Signed)
[place], [name], [address] Phone: [phone]   Fax:  [phone]  Wound Care Therapy  Patient Details  Name: [name] MRN: [id number] Date of Birth: [birth year] No Data Recorded  Encounter Date: 03/29/2016      PT End of Session - 03/29/16 0907    Visit Number 8   Number of Visits 16   Date for PT Re-Evaluation 04/21/16   Authorization Type UHC Medicare Gcodes updated on visit #7   Authorization Time Period 03/24/16 to 05/12/16   Authorization - Visit Number 8   Authorization - Number of Visits 16   Activity Tolerance Patient tolerated treatment well   Behavior During Therapy [facility] for tasks assessed/performed      Past Medical History:  Diagnosis Date  . At risk for sudden cardiac death 07-03-13  . Cataract   . Chronic anticoagulation 05/2013   started  . Chronic kidney disease    Patient reports he is seeing Kidney doctor in September  . Coronary artery disease   . Diabetes mellitus    type 2   . Dyslipidemia   . GERD (gastroesophageal reflux disease)   . History of valve replacement 2010   [facility] Ease bioprosthetic 58m  . Hypertension   . PAF (paroxysmal atrial fibrillation) ([place] 05/2013   converted with amiodarone to SR  . S/P CABG x 3 2010  . S/P cardiac catheterization, Rt & Lt heart cath 06/05/2013 1Nov 19, 2014 . Stroke ([facility] 2003   R-sided weakness & upper extremity swelling   . Thyroid disease     Past Surgical History:  Procedure Laterality Date  . AORTIC VALVE REPLACEMENT  [facility]/26/2009   [facility]  . CARDIAC CATHETERIZATION  06/05/2013   native 3 vessel disease; patent LIMA to LAD, SVG to OM and SVG to PDA; Elevated right and left heart filling pressures, with predominantly pulmonary venous hypertension, normal PVR  . COLONOSCOPY  2007   Dr. MaAviva Signsinternal hemorrhoids  . CORONARY ARTERY BYPASS GRAFT  01/26/2009   LIMA to  LAD, SVG to circumflex, SVG to PDA  . GRAFT(S) ANGIOGRAM  06/05/2013   Procedure: GRAFT(S) ANCyril Loosen Surgeon: DaLeonie ManMD;  [facility];  Service: Cardiovascular;;  . LEFT AND RIGHT HEART CATHETERIZATION WITH CORONARY ANGIOGRAM N/A 06/05/2013   Procedure: LEFT AND RIGHT HEART CATHETERIZATION WITH CORONARY ANGIOGRAM;  Surgeon: DaLeonie ManMD;  [facility];  Service: Cardiovascular;  Laterality: N/A;  . TRANSTHORACIC ECHOCARDIOGRAM  09/2011   grade 1 diastolic dysfunction; increasing valve gradient; calcified MV annulus   . TRANSTHORACIC ECHOCARDIOGRAM  06/03/2013   EF 25-30%, mod conc. hypertrophy, grade 3 diastolic dysfunction; LA mod dilated; calcified MV annulus; transaortic gradients are normal for the bioprosthetic valve; inf vena cava dilated (elevated CVP) - LifeVest    There were no vitals filed for this visit.                  Wound Therapy - 03/29/16 0859    Patient and Family Stated Goals wound to heal    Date of Onset 02/18/13  approximate date   Wound Properties Date First Assessed: 02/23/16 Wound Type: Other (Comment) , open area R great toe   Location: Other (Comment) , Medial R great toe   Location Orientation: Right;Medial Present on Admission: Yes   Dressing Type Gauze (Comment);Impregnated gauze (bismuth)   Dressing Changed Changed   Dressing Status  Clean;Dry;Intact   Dressing Change Frequency PRN   Site / Wound Assessment Clean;Granulation tissue   % Wound base Red or Granulating 100%   % Wound base Yellow 0%   % Wound base Other (Comment) --  Biofilm   Peri-wound Assessment Other (Comment)  callous   Margins Attached edges (approximated)   Closure None   Drainage Amount Scant   Drainage Description Serosanguineous   Treatment Cleansed;Debridement (Selective)   Incision Properties Date First Assessed: 04/23/15 Time First Assessed: 1738 Location: Groin Location Orientation: Left Present on Admission: No   Wound  Therapy - Clinical Statement Little progress from last visit, however continues to show signs of healing.  Debrided callous/dry skin from perimeter as well as undermined border to promote approximation.  Continued with use of xeroform, 2X2 and medipore tape.   Wound Therapy - Functional Problem List dificulty walking, dificulty wearing shoes    Factors Delaying/Impairing Wound Healing Altered sensation;Diabetes Mellitus;Infection - systemic/local;Immobility;Multiple medical problems;Vascular compromise   Hydrotherapy Plan Debridement;Dressing change;Patient/family education   Wound Therapy - Frequency Other (comment)  2x/ week   Wound Therapy - Current Recommendations PT   Wound Plan Continued cleansing and  debridement,(if needed); may alter dressings as needed if wound does not heal as anticipated.  will need an evaluation for strengthening.   Dressing  xeroform with vasiline to periphery; medipore tape to decrease hypergranulation tissue.                    PT Short Term Goals - 03/24/16 1055      PT SHORT TERM GOAL #1   Title Patient to demonstrate reduction of wound area by at least 50% on all measured dimensions in order to demonstrate improvement of condition    Time 2   Period Weeks   Status Partially Met     PT SHORT TERM GOAL #2   Title Patient will verbally be able to verbally state at least 3 methods of foot care/ways to prevent further wound formation in order to prevent wound recurrence    Time 2   Period Weeks   Status Achieved           PT Long Term Goals - 03/24/16 1056      PT LONG TERM GOAL #1   Title Patient to be able to wear all pairs of shoes without exacerbation of R great toe in order to improve QOL and recurrence of wound    Time 4   Period Weeks   Status On-going     PT LONG TERM GOAL #2   Title Patient to display full wound closure in order to demonstrate resolution of condition    Time 4   Period Weeks   Status On-going              Patient will benefit from skilled therapeutic intervention in order to improve the following deficits and impairments:     Visit Diagnosis: Unspecified superficial injury of right great toe, sequela  Difficulty in walking, not elsewhere classified  Unsteadiness on feet  Non-pressure chronic ulcer of other part of right foot with necrosis of muscle ([place])     Problem List Patient Active Problem List   Diagnosis Date Noted  . Nausea & vomiting 02/24/2016  . A-fib ([place]) 02/23/2016  . Hypothyroidism 02/23/2016  . CKD (chronic kidney disease), stage III 02/23/2016  . Anxiety 02/23/2016  . Chronic combined systolic (congestive) and diastolic (congestive) heart failure ([place]) 02/23/2016  . Dizziness 02/23/2016  .  Open wound of right foot 02/23/2016  . Rectal bleeding 07/28/2015  . Chronic anticoagulation 07/28/2015  . PVD (peripheral vascular disease) ([place])   . S/P angioplasty 04/23/2015  . Peripheral arterial occlusive disease ([place]) 04/23/2015  . PAD (peripheral artery disease) ([place])   . Critical lower limb ischemia   . Toe ulcer, right ([place])   . Type II or unspecified type diabetes mellitus without mention of complication, uncontrolled 11/27/2013  . Acute respiratory failure with hypoxia ([place]) 11/26/2013  . CAP (community acquired pneumonia) 11/26/2013  . CHF exacerbation ([place]) 11/25/2013  . Cardiomyopathy, ischemic 06/13/2013  . At risk for sudden cardiac death 2013/07/02  . S/P cardiac catheterization, Rt & Lt heart cath 06/05/2013 2013/07/02  . Acute on chronic combined systolic and diastolic HF (heart failure), NYHA class 3 ([place]) 06/04/2013  . Atrial flutter with rapid ventricular response ([place]) 06/02/2013  . Acute renal failure: Cr up to 1.8 06/02/2013  . Hyperkalemia 06/02/2013    Class: Acute  . LBBB (left bundle branch block) 06/01/2013  . Hypotension- secondary to AF and Diltiazem Rx 06/01/2013  . Obesity (BMI 30-39.9) 06/01/2013  . Acute on  chronic diastolic heart failure - due to Afib AVR 06/01/2013  . Atrial fibrillation with RVR- converted with Amiodarone 05/31/2013  . Stroke- Lt brain 2003 04/08/2013  . Hemiplegia affecting right dominant side ([place]) 04/08/2013  . Cataract 04/08/2013  . GERD (gastroesophageal reflux disease) 04/08/2013  . S/P CABG x 3 - 2010 04/08/2013  . S/P AVR-tissue, 2010 04/08/2013  . DM2 (diabetes mellitus, type 2) ([place]) 04/08/2013  . Dyslipidemia 04/08/2013  . SUBACROMIAL BURSITIS, LEFT 11/05/2009    Teena Irani, PTA/CLT [phone]  03/29/2016, 9:28 AM  [place] [address] [place], [name], [address] Phone: [phone]   Fax:  [phone]  Name: [name] MRN: [id number] Date of Birth: 05/15/1946

## 2016-03-31 ENCOUNTER — Ambulatory Visit (HOSPITAL_COMMUNITY): Payer: Medicare Other

## 2016-03-31 DIAGNOSIS — R262 Difficulty in walking, not elsewhere classified: Secondary | ICD-10-CM | POA: Diagnosis not present

## 2016-03-31 DIAGNOSIS — L97513 Non-pressure chronic ulcer of other part of right foot with necrosis of muscle: Secondary | ICD-10-CM | POA: Diagnosis not present

## 2016-03-31 DIAGNOSIS — R2681 Unsteadiness on feet: Secondary | ICD-10-CM

## 2016-03-31 DIAGNOSIS — S90931S Unspecified superficial injury of right great toe, sequela: Secondary | ICD-10-CM

## 2016-03-31 NOTE — Therapy (Signed)
[place], [name], [address] Phone: [phone]   Fax:  [phone]  Wound Care Therapy  Patient Details  Name: [name] MRN: [id number] Date of Birth: Mar 27, 1946 No Data Recorded  Encounter Date: 03/31/2016      PT End of Session - 03/31/16 1047    Visit Number 9   Number of Visits 16   Date for PT Re-Evaluation 04/21/16   Authorization Type UHC Medicare Gcodes updated on visit #7   Authorization Time Period 03/24/16 to 05/12/16   Authorization - Visit Number 9   Authorization - Number of Visits 16   PT Start Time 1010   PT Stop Time 1033   PT Time Calculation (min) 23 min   Activity Tolerance Patient tolerated treatment well   Behavior During Therapy [facility] for tasks assessed/performed      Past Medical History:  Diagnosis Date  . At risk for sudden cardiac death 06-17-2013  . Cataract   . Chronic anticoagulation 05/2013   started  . Chronic kidney disease    Patient reports he is seeing Kidney doctor in September  . Coronary artery disease   . Diabetes mellitus    type 2   . Dyslipidemia   . GERD (gastroesophageal reflux disease)   . History of valve replacement 2010   [facility] Ease bioprosthetic 15m  . Hypertension   . PAF (paroxysmal atrial fibrillation) ([place] 05/2013   converted with amiodarone to SR  . S/P CABG x 3 2010  . S/P cardiac catheterization, Rt & Lt heart cath 06/05/2013 1November 03, 2014 . Stroke ([facility] 2003   R-sided weakness & upper extremity swelling   . Thyroid disease     Past Surgical History:  Procedure Laterality Date  . AORTIC VALVE REPLACEMENT  01/26/2009   [facility]  . CARDIAC CATHETERIZATION  06/05/2013   native 3 vessel disease; patent LIMA to LAD, SVG to OM and SVG to PDA; Elevated right and left heart filling pressures, with predominantly pulmonary venous hypertension, normal PVR  . COLONOSCOPY  2007   Dr. MaAviva Signs internal hemorrhoids  . CORONARY ARTERY BYPASS GRAFT  01/26/2009   LIMA to LAD, SVG to circumflex, SVG to PDA  . GRAFT(S) ANGIOGRAM  06/05/2013   Procedure: GRAFT(S) ANCyril Loosen Surgeon: DaLeonie ManMD;  [facility];  Service: Cardiovascular;;  . LEFT AND RIGHT HEART CATHETERIZATION WITH CORONARY ANGIOGRAM N/A 06/05/2013   Procedure: LEFT AND RIGHT HEART CATHETERIZATION WITH CORONARY ANGIOGRAM;  Surgeon: DaLeonie ManMD;  [facility];  Service: Cardiovascular;  Laterality: N/A;  . TRANSTHORACIC ECHOCARDIOGRAM  09/2011   grade 1 diastolic dysfunction; increasing valve gradient; calcified MV annulus   . TRANSTHORACIC ECHOCARDIOGRAM  06/03/2013   EF 25-30%, mod conc. hypertrophy, grade 3 diastolic dysfunction; LA mod dilated; calcified MV annulus; transaortic gradients are normal for the bioprosthetic valve; inf vena cava dilated (elevated CVP) - LifeVest    There were no vitals filed for this visit.       Subjective Assessment - 03/31/16 1035    Subjective Dressing intact, no reports of pain.     Pertinent History Patient reports that this wound started as a small little brown spot on the end of his toe that just kept getting bigger; he thinks it started about 8 months ago. He reports that it is now slowly getting better but has been healing slow. He states  that it  has been getting better.                    Wound Therapy - 03/31/16 1036    Subjective Dressing intact, no reports of pain.     Patient and Family Stated Goals wound to heal    Date of Onset 02/18/13   Pain Assessment No/denies pain   Evaluation and Treatment Procedures Explained to Patient/Family Yes   Evaluation and Treatment Procedures agreed to   Wound Properties Date First Assessed: 02/23/16 Wound Type: Other (Comment) , open area R great toe   Location: Other (Comment) , Medial R great toe   Location Orientation: Right;Medial Present on Admission: Yes   Dressing Type Gauze  (Comment);Impregnated gauze (bismuth)  xeroform, 2x2, medipore tape   Dressing Changed Changed   Dressing Status Clean;Dry;Intact   Dressing Change Frequency PRN   Site / Wound Assessment Clean;Granulation tissue   % Wound base Red or Granulating 100%   % Wound base Yellow 0%   % Wound base Other (Comment) --  Biofilm   Peri-wound Assessment Other (Comment)  callous   Margins Attached edges (approximated)   Closure None   Drainage Amount Scant   Drainage Description Serosanguineous   Treatment Cleansed;Debridement (Selective)   Incision Properties Date First Assessed: 04/23/15 Time First Assessed: 1738 Location: Groin Location Orientation: Left Present on Admission: No   Wound Therapy - Clinical Statement Wound progressing well.  Continued improvement with increased approximation.  Selective debridement for removal of callous surrounding wound and biofilm on wound bed.  Continued with xeroform and medipore tape dressings.     Wound Therapy - Functional Problem List dificulty walking, dificulty wearing shoes    Factors Delaying/Impairing Wound Healing Altered sensation;Diabetes Mellitus;Infection - systemic/local;Immobility;Multiple medical problems;Vascular compromise   Hydrotherapy Plan Debridement;Dressing change;Patient/family education   Wound Therapy - Frequency Other (comment)  2x/ week   Wound Therapy - Current Recommendations PT   Wound Plan Continued cleansing and  debridement,(if needed); may alter dressings as needed if wound does not heal as anticipated.  will need an evaluation for strengthening.   Dressing  xeroform with vasiline to periphery; medipore tape to decrease hypergranulation tissue.                    PT Short Term Goals - 03/24/16 1055      PT SHORT TERM GOAL #1   Title Patient to demonstrate reduction of wound area by at least 50% on all measured dimensions in order to demonstrate improvement of condition    Time 2   Period Weeks   Status  Partially Met     PT SHORT TERM GOAL #2   Title Patient will verbally be able to verbally state at least 3 methods of foot care/ways to prevent further wound formation in order to prevent wound recurrence    Time 2   Period Weeks   Status Achieved           PT Long Term Goals - 03/24/16 1056      PT LONG TERM GOAL #1   Title Patient to be able to wear all pairs of shoes without exacerbation of R great toe in order to improve QOL and recurrence of wound    Time 4   Period Weeks   Status On-going     PT LONG TERM GOAL #2   Title Patient to display full wound closure in order to demonstrate resolution of condition    Time 4  Period Weeks   Status On-going             Patient will benefit from skilled therapeutic intervention in order to improve the following deficits and impairments:     Visit Diagnosis: Unspecified superficial injury of right great toe, sequela  Difficulty in walking, not elsewhere classified  Unsteadiness on feet     Problem List Patient Active Problem List   Diagnosis Date Noted  . Nausea & vomiting 02/24/2016  . A-fib ([place]) 02/23/2016  . Hypothyroidism 02/23/2016  . CKD (chronic kidney disease), stage III 02/23/2016  . Anxiety 02/23/2016  . Chronic combined systolic (congestive) and diastolic (congestive) heart failure ([place]) 02/23/2016  . Dizziness 02/23/2016  . Open wound of right foot 02/23/2016  . Rectal bleeding 07/28/2015  . Chronic anticoagulation 07/28/2015  . PVD (peripheral vascular disease) ([place])   . S/P angioplasty 04/23/2015  . Peripheral arterial occlusive disease ([place]) 04/23/2015  . PAD (peripheral artery disease) ([place])   . Critical lower limb ischemia   . Toe ulcer, right ([place])   . Type II or unspecified type diabetes mellitus without mention of complication, uncontrolled 11/27/2013  . Acute respiratory failure with hypoxia ([place]) 11/26/2013  . CAP (community acquired pneumonia) 11/26/2013  . CHF exacerbation ([place])  11/25/2013  . Cardiomyopathy, ischemic 06/13/2013  . At risk for sudden cardiac death 07/06/2013  . S/P cardiac catheterization, Rt & Lt heart cath 06/05/2013 06-Jul-2013  . Acute on chronic combined systolic and diastolic HF (heart failure), NYHA class 3 ([place]) 06/04/2013  . Atrial flutter with rapid ventricular response ([place]) 06/02/2013  . Acute renal failure: Cr up to 1.8 06/02/2013  . Hyperkalemia 06/02/2013    Class: Acute  . LBBB (left bundle branch block) 06/01/2013  . Hypotension- secondary to AF and Diltiazem Rx 06/01/2013  . Obesity (BMI 30-39.9) 06/01/2013  . Acute on chronic diastolic heart failure - due to Afib AVR 06/01/2013  . Atrial fibrillation with RVR- converted with Amiodarone 05/31/2013  . Stroke- Lt brain 2003 04/08/2013  . Hemiplegia affecting right dominant side ([place]) 04/08/2013  . Cataract 04/08/2013  . GERD (gastroesophageal reflux disease) 04/08/2013  . S/P CABG x 3 - 2010 04/08/2013  . S/P AVR-tissue, 2010 04/08/2013  . DM2 (diabetes mellitus, type 2) ([place]) 04/08/2013  . Dyslipidemia 04/08/2013  . SUBACROMIAL BURSITIS, LEFT 11/05/2009   Ihor Austin, LPTA; CBIS [phone]  Aldona Lento 03/31/2016, 10:49 AM  [place], [name], [address] Phone: [phone]   Fax:  [phone]  Name: TAVIN VERNET MRN: [id number] Date of Birth: [birth year]

## 2016-04-01 DIAGNOSIS — E1140 Type 2 diabetes mellitus with diabetic neuropathy, unspecified: Secondary | ICD-10-CM | POA: Diagnosis not present

## 2016-04-01 DIAGNOSIS — H40013 Open angle with borderline findings, low risk, bilateral: Secondary | ICD-10-CM | POA: Diagnosis not present

## 2016-04-01 DIAGNOSIS — H524 Presbyopia: Secondary | ICD-10-CM | POA: Diagnosis not present

## 2016-04-01 DIAGNOSIS — D2311 Other benign neoplasm of skin of right eyelid, including canthus: Secondary | ICD-10-CM | POA: Diagnosis not present

## 2016-04-06 ENCOUNTER — Ambulatory Visit (HOSPITAL_COMMUNITY): Payer: Medicare Other | Admitting: Physical Therapy

## 2016-04-06 ENCOUNTER — Other Ambulatory Visit (HOSPITAL_COMMUNITY): Payer: Self-pay | Admitting: Interventional Radiology

## 2016-04-06 ENCOUNTER — Ambulatory Visit (INDEPENDENT_AMBULATORY_CARE_PROVIDER_SITE_OTHER): Payer: Medicare Other | Admitting: Internal Medicine

## 2016-04-06 DIAGNOSIS — I739 Peripheral vascular disease, unspecified: Secondary | ICD-10-CM

## 2016-04-06 DIAGNOSIS — R2681 Unsteadiness on feet: Secondary | ICD-10-CM | POA: Diagnosis not present

## 2016-04-06 DIAGNOSIS — R262 Difficulty in walking, not elsewhere classified: Secondary | ICD-10-CM | POA: Diagnosis not present

## 2016-04-06 DIAGNOSIS — L97513 Non-pressure chronic ulcer of other part of right foot with necrosis of muscle: Secondary | ICD-10-CM | POA: Diagnosis not present

## 2016-04-06 DIAGNOSIS — S90931S Unspecified superficial injury of right great toe, sequela: Secondary | ICD-10-CM | POA: Diagnosis not present

## 2016-04-06 NOTE — Therapy (Signed)
[place], [name], [address] Phone: [phone]   Fax:  [phone]  Wound Care Therapy  Patient Details  Name: [name] MRN: [id number] Date of Birth: 1946-05-06 No Data Recorded  Encounter Date: 04/06/2016      PT End of Session - 04/06/16 1309    Visit Number 10   Number of Visits 16   Date for PT Re-Evaluation 04/21/16   Authorization Type UHC Medicare Gcodes updated on visit #7   Authorization Time Period 03/24/16 to 05/12/16   Authorization - Visit Number 10   Authorization - Number of Visits 16   PT Start Time 0820   PT Stop Time 0845   PT Time Calculation (min) 25 min   Activity Tolerance Patient tolerated treatment well   Behavior During Therapy [facility] for tasks assessed/performed      Past Medical History:  Diagnosis Date  . At risk for sudden cardiac death 06-09-13  . Cataract   . Chronic anticoagulation 05/2013   started  . Chronic kidney disease    Patient reports he is seeing Kidney doctor in September  . Coronary artery disease   . Diabetes mellitus    type 2   . Dyslipidemia   . GERD (gastroesophageal reflux disease)   . History of valve replacement 2010   [facility] Ease bioprosthetic 20m  . Hypertension   . PAF (paroxysmal atrial fibrillation) ([place] 05/2013   converted with amiodarone to SR  . S/P CABG x 3 2010  . S/P cardiac catheterization, Rt & Lt heart cath 06/05/2013 12014-10-26 . Stroke ([facility] 2003   R-sided weakness & upper extremity swelling   . Thyroid disease     Past Surgical History:  Procedure Laterality Date  . [facility]ORTIC [facility]  [facility]  . CARDIAC CATHETERIZATION  06/05/2013   native 3 vessel disease; patent LIMA to LAD, SVG to OM and SVG to PDA; Elevated right and left heart filling pressures, with predominantly pulmonary venous hypertension, normal PVR  . COLONOSCOPY  2007   Dr. MaAviva Signsinternal hemorrhoids  . CORONARY ARTERY BYPASS GRAFT  01/26/2009   LIMA to LAD, SVG to circumflex, SVG to PDA  . GRAFT(S) ANGIOGRAM  06/05/2013   Procedure: GRAFT(S) ANCyril Loosen Surgeon: DaLeonie ManMD;  [facility];  Service: Cardiovascular;;  . LEFT AND RIGHT HEART CATHETERIZATION WITH CORONARY ANGIOGRAM N/A 06/05/2013   Procedure: LEFT AND RIGHT HEART CATHETERIZATION WITH CORONARY ANGIOGRAM;  Surgeon: DaLeonie ManMD;  [facility];  Service: Cardiovascular;  Laterality: N/A;  . TRANSTHORACIC ECHOCARDIOGRAM  09/2011   grade 1 diastolic dysfunction; increasing valve gradient; calcified MV annulus   . TRANSTHORACIC ECHOCARDIOGRAM  06/03/2013   EF 25-30%, mod conc. hypertrophy, grade 3 diastolic dysfunction; LA mod dilated; calcified MV annulus; transaortic gradients are normal for the bioprosthetic valve; inf vena cava dilated (elevated CVP) - LifeVest    There were no vitals filed for this visit.       Subjective Assessment - 04/06/16 1302    Pertinent History Patient reports that this wound started as a small little brown spot on the end of his toe that just kept getting bigger; he thinks it started about 8 months ago. He reports that it is now slowly getting better but has been healing slow. He states  that it has been getting better.  Wound Therapy - 04/06/16 1304    Subjective Dressing intact, no reports of pain.     Patient and Family Stated Goals wound to heal    Date of Onset 02/18/13   Pain Assessment No/denies pain   Evaluation and Treatment Procedures Explained to Patient/Family Yes   Evaluation and Treatment Procedures agreed to   Wound Properties Date First Assessed: 02/23/16 Wound Type: Other (Comment) , open area R great toe   Location: Other (Comment) , Medial R great toe   Location Orientation: Right;Medial Present on Admission: Yes   Dressing Type Gauze (Comment);Impregnated gauze (bismuth)  xeroform,  2x2, medipore tape   Dressing Changed Changed   Dressing Status Clean;Dry;Intact   Dressing Change Frequency PRN   Site / Wound Assessment Clean;Granulation tissue   % Wound base Red or Granulating 100%   % Wound base Yellow 0%   % Wound base Other (Comment) --  Biofilm   Peri-wound Assessment Other (Comment)  callous   Wound Length (cm) 0.2 cm  was .4   Wound Width (cm) 0.2 cm  was .3   Margins Attached edges (approximated)   Closure None   Drainage Amount None   Treatment Cleansed;Debridement (Selective)   Incision Properties Date First Assessed: 04/23/15 Time First Assessed: 1738 Location: Groin Location Orientation: Left Present on Admission: No   Selective Debridement - Location --  callous around wound    Selective Debridement - Tools Used Forceps;Scissors   Selective Debridement - Tissue Removed --  callous    Wound Therapy - Clinical Statement Wound continues to decrease in size.  Anticipate that next treatment will be the patient's last visit.  Due to history of CVA, non-healing wounds and diabetes we will see pt until his wound is 100% healed.    Wound Therapy - Functional Problem List dificulty walking, dificulty wearing shoes    Factors Delaying/Impairing Wound Healing Altered sensation;Diabetes Mellitus;Infection - systemic/local;Immobility;Multiple medical problems;Vascular compromise   Hydrotherapy Plan Debridement;Dressing change;Patient/family education   Wound Therapy - Frequency Other (comment)  2x/ week   Wound Therapy - Current Recommendations PT   Wound Plan Continued cleansing and  debridement,(if needed);    Dressing  xeroform with vasiline to periphery; medipore tape to decrease hypergranulation tissue.                    PT Short Term Goals - 04/06/16 1310      PT SHORT TERM GOAL #1   Title Patient to demonstrate reduction of wound area by at least 50% on all measured dimensions in order to demonstrate improvement of condition    Time 2    Period Weeks   Status Achieved     PT SHORT TERM GOAL #2   Title Patient will verbally be able to verbally state at least 3 methods of foot care/ways to prevent further wound formation in order to prevent wound recurrence    Time 2   Period Weeks   Status Achieved           PT Long Term Goals - 04/06/16 1310      PT LONG TERM GOAL #1   Title Patient to be able to wear all pairs of shoes without exacerbation of R great toe in order to improve QOL and recurrence of wound    Time 4   Period Weeks   Status Partially Met     PT LONG TERM GOAL #2   Title Patient to display full wound closure in order to  demonstrate resolution of condition    Time 4   Period Weeks   Status On-going               Plan - 05/01/2016 1309    Clinical Impression Statement see above    Rehab Potential Good   PT Frequency 1x / week   PT Duration 4 weeks   PT Treatment/Interventions Other (comment);Patient/family education   PT Next Visit Plan continue with dressing and self care.        Patient will benefit from skilled therapeutic intervention in order to improve the following deficits and impairments:  Impaired sensation, Other (comment), Difficulty walking  Visit Diagnosis: Unspecified superficial injury of right great toe, sequela  Difficulty in walking, not elsewhere classified      G-Codes - 05/01/2016 1311    Functional Limitation Other PT primary   Other PT Primary Current Status (A0045) At least 1 percent but less than 20 percent impaired, limited or restricted   Other PT Primary Goal Status (T9774) At least 1 percent but less than 20 percent impaired, limited or restricted       Problem List Patient Active Problem List   Diagnosis Date Noted  . Nausea & vomiting 02/24/2016  . A-fib ([place]) 02/23/2016  . Hypothyroidism 02/23/2016  . CKD (chronic kidney disease), stage III 02/23/2016  . Anxiety 02/23/2016  . Chronic combined systolic (congestive) and diastolic (congestive)  heart failure ([place]) 02/23/2016  . Dizziness 02/23/2016  . Open wound of right foot 02/23/2016  . Rectal bleeding 07/28/2015  . Chronic anticoagulation 07/28/2015  . PVD (peripheral vascular disease) ([place])   . S/P angioplasty 04/23/2015  . Peripheral arterial occlusive disease ([place]) 04/23/2015  . PAD (peripheral artery disease) ([place])   . Critical lower limb ischemia   . Toe ulcer, right ([place])   . Type II or unspecified type diabetes mellitus without mention of complication, uncontrolled 11/27/2013  . Acute respiratory failure with hypoxia ([place]) 11/26/2013  . CAP (community acquired pneumonia) 11/26/2013  . CHF exacerbation ([place]) 11/25/2013  . Cardiomyopathy, ischemic 06/13/2013  . At risk for sudden cardiac death 07/02/2013  . S/P cardiac catheterization, Rt & Lt heart cath 06/05/2013 July 02, 2013  . Acute on chronic combined systolic and diastolic HF (heart failure), NYHA class 3 ([place]) 06/04/2013  . Atrial flutter with rapid ventricular response ([place]) 06/02/2013  . Acute renal failure: Cr up to 1.8 06/02/2013  . Hyperkalemia 06/02/2013    Class: Acute  . LBBB (left bundle branch block) 06/01/2013  . Hypotension- secondary to AF and Diltiazem Rx 06/01/2013  . Obesity (BMI 30-39.9) 06/01/2013  . Acute on chronic diastolic heart failure - due to Afib AVR 06/01/2013  . Atrial fibrillation with RVR- converted with Amiodarone 05/31/2013  . Stroke- Lt brain 2003 04/08/2013  . Hemiplegia affecting right dominant side ([place]) 04/08/2013  . Cataract 04/08/2013  . GERD (gastroesophageal reflux disease) 04/08/2013  . S/P CABG x 3 - 2010 04/08/2013  . S/P AVR-tissue, 2010 04/08/2013  . DM2 (diabetes mellitus, type 2) ([place]) 04/08/2013  . Dyslipidemia 04/08/2013  . SUBACROMIAL BURSITIS, LEFT 11/05/2009    Rayetta Humphrey, PT CLT [phone] May 01, 2016, 1:12 PM  [place] [address] [place], [name], [address] Phone: [phone]   Fax:   [phone]  Name: [name] MRN: [id number] Date of Birth: [birth year]

## 2016-04-11 ENCOUNTER — Telehealth: Payer: Self-pay | Admitting: Internal Medicine

## 2016-04-11 ENCOUNTER — Other Ambulatory Visit: Payer: Self-pay | Admitting: Internal Medicine

## 2016-04-11 DIAGNOSIS — G894 Chronic pain syndrome: Secondary | ICD-10-CM | POA: Diagnosis not present

## 2016-04-11 DIAGNOSIS — J069 Acute upper respiratory infection, unspecified: Secondary | ICD-10-CM | POA: Diagnosis not present

## 2016-04-11 NOTE — Telephone Encounter (Signed)
Rx request sent to pharmacy.  

## 2016-04-11 NOTE — Telephone Encounter (Signed)
Estill Bamberg the pharmacist at [facility] drug calling with a drug interaction between his Amiodarone and Zithromax-was told by doctor who prescribed the Zithromax to just stop the Amiodarone until he has finished the course. She wanto to ok he can stop the Amiodarone.

## 2016-04-11 NOTE — Telephone Encounter (Signed)
Records show he has been off of amiodarone for 2 months or more. This medicine has a long-half life, stopping it for a few days may not prevent interaction. I would advise the prescriber to check the QTc on EKG before prescribing azithromycin.  Dr. Lemmie Evens

## 2016-04-11 NOTE — Telephone Encounter (Signed)
Returned call to [employer] pharmacist at [facility].She stated patient is still taking amiodarone 200 mg daily.He just had refilled on 03/14/16.Dr.Hilty advised Dr.who prescribed azithromycin will need to do a EKG first to check QTc.I will send message back to Dr.HIlty to see if patient needs to be taking amiodarone.

## 2016-04-11 NOTE — Telephone Encounter (Signed)
Message routed to MD & Erasmo Downer to review and advise  Amiodarone removed from med list after recent hospitalization (reason: patient has not taken in 30 days) Does he need to continue this?

## 2016-04-12 ENCOUNTER — Ambulatory Visit (HOSPITAL_COMMUNITY): Payer: Medicare Other

## 2016-04-12 DIAGNOSIS — S90931S Unspecified superficial injury of right great toe, sequela: Secondary | ICD-10-CM | POA: Diagnosis not present

## 2016-04-12 DIAGNOSIS — R262 Difficulty in walking, not elsewhere classified: Secondary | ICD-10-CM | POA: Diagnosis not present

## 2016-04-12 DIAGNOSIS — R2681 Unsteadiness on feet: Secondary | ICD-10-CM | POA: Diagnosis not present

## 2016-04-12 DIAGNOSIS — L97513 Non-pressure chronic ulcer of other part of right foot with necrosis of muscle: Secondary | ICD-10-CM | POA: Diagnosis not present

## 2016-04-12 NOTE — Telephone Encounter (Signed)
Returned call to pharmacy and gave advisements regarding patient's med - instructed to have pt continue amio and seek alternative antibiotic. Estill Bamberg informs me she will follow up w PCP.

## 2016-04-12 NOTE — Therapy (Signed)
[place], [name], [address] Phone: [phone]   Fax:  [phone]  Wound Care Therapy  Patient Details  Name: [name] MRN: [id number] Date of Birth: [birth year] No Data Recorded  Encounter Date: 04/12/2016      PT End of Session - 04/12/16 0906    Visit Number 11   Number of Visits 16   Date for PT Re-Evaluation 04/21/16   Authorization Type UHC Medicare Gcodes updated on visit #7   Authorization Time Period 03/24/16 to 05/12/16   Authorization - Visit Number 11   Authorization - Number of Visits 16   PT Start Time 0817   PT Stop Time 0842   PT Time Calculation (min) 25 min   Activity Tolerance Patient tolerated treatment well   Behavior During Therapy [facility] for tasks assessed/performed      Past Medical History:  Diagnosis Date  . At risk for sudden cardiac death Jul 07, 2013  . Cataract   . Chronic anticoagulation 05/2013   started  . Chronic kidney disease    Patient reports he is seeing Kidney doctor in September  . Coronary artery disease   . Diabetes mellitus    type 2   . Dyslipidemia   . GERD (gastroesophageal reflux disease)   . History of valve replacement 2010   [facility] Ease bioprosthetic 30m  . Hypertension   . PA[place]mal [place]) [place] 05[place]   converted with amiodarone to SR  . S/P CABG x 3 2010  . S/P cardiac catheterization, Rt & Lt heart cath 06/05/2013 12014-11-23 . Stroke ([facility] 2003   R-sided weakness & upper extremity swelling   . Thyroid disease     Past Surgical History:  Procedure Laterality Date  . [facility]  [facility]  . CARDIAC CATHETERIZATION  06/05/2013   native 3 vessel disease; patent LIMA to LAD, SVG to OM and SVG to PDA; Elevated right and left heart filling pressures, with predominantly pulmonary venous hypertension, normal PVR  . COLONOSCOPY  2007   Dr. MaAviva Signsinternal hemorrhoids  . CORONARY ARTERY BYPASS GRAFT  01/26/2009   LIMA to LAD, SVG to circumflex, SVG to PDA  . GRAFT(S) ANGIOGRAM  06/05/2013   Procedure: GRAFT(S) ANCyril Loosen Surgeon: DaLeonie ManMD;  [facility];  Service: Cardiovascular;;  . LEFT AND RIGHT HEART CATHETERIZATION WITH CORONARY ANGIOGRAM N/A 06/05/2013   Procedure: LEFT AND RIGHT HEART CATHETERIZATION WITH CORONARY ANGIOGRAM;  Surgeon: DaLeonie ManMD;  [facility];  Service: Cardiovascular;  Laterality: N/A;  . TRANSTHORACIC ECHOCARDIOGRAM  09/2011   grade 1 diastolic dysfunction; increasing valve gradient; calcified MV annulus   . TRANSTHORACIC ECHOCARDIOGRAM  06/03/2013   EF 25-30%, mod conc. hypertrophy, grade 3 diastolic dysfunction; LA mod dilated; calcified MV annulus; transaortic gradients are normal for the bioprosthetic valve; inf vena cava dilated (elevated CVP) - LifeVest    There were no vitals filed for this visit.       Subjective Assessment - 04/12/16 0901    Subjective Pain free, dressings intact.   Currently in Pain? No/denies                   Wound Therapy - 04/12/16 0902    Subjective Pain free, dressings intact.   Patient and Family Stated Goals wound to heal    Date of Onset 02/18/13   Pain Assessment  No/denies pain   Evaluation and Treatment Procedures Explained to Patient/Family Yes   Evaluation and Treatment Procedures agreed to   Wound Properties Date First Assessed: 02/23/16 Wound Type: Other (Comment) , open area R great toe   Location: Other (Comment) , Medial R great toe   Location Orientation: Right;Medial Present on Admission: Yes   Dressing Type Impregnated gauze (petrolatum);Gauze (Comment);Tape dressing  Xeroform, 2x2 and medipore tape   Dressing Changed Changed   Dressing Status Clean;Dry;Intact   Dressing Change Frequency PRN   Site / Wound Assessment Clean;Granulation tissue   % Wound base Red or Granulating 100%   % Wound  base Yellow 0%   % Wound base Other (Comment) --  Biofilm   Peri-wound Assessment Other (Comment)   Margins Attached edges (approximated)   Closure None   Drainage Amount None   Treatment Cleansed;Debridement (Selective)   Incision Properties Date First Assessed: 04/23/15 Time First Assessed: 1738 Location: Groin Location Orientation: Left Present on Admission: No   Selective Debridement - Location callous perimeter of wound   Selective Debridement - Tools Used Forceps;Scalpel   Selective Debridement - Tissue Removed callous   Wound Therapy - Clinical Statement Wound decreaseing in size and decreased hypergranulation noted this session.  Selective debridement for removal of callous perimeter of wound.  Continued with xeroform, 2x2 and medipore tape. Anticipate the next treatment may be dischaged due to hx of CVA, non-healing wounds and diabetes will need to continue until 100% healed.   Wound Therapy - Functional Problem List dificulty walking, dificulty wearing shoes    Factors Delaying/Impairing Wound Healing Altered sensation;Diabetes Mellitus;Infection - systemic/local;Immobility;Multiple medical problems;Vascular compromise   Hydrotherapy Plan Debridement;Dressing change;Patient/family education   Wound Therapy - Frequency Other (comment)  2x/week   Wound Therapy - Current Recommendations PT   Wound Plan Continued cleansing and  debridement,(if needed);    Dressing  xeroform with vasiline to periphery; medipore tape to decrease hypergranulation tissue.                    PT Short Term Goals - 04/06/16 1310      PT SHORT TERM GOAL #1   Title Patient to demonstrate reduction of wound area by at least 50% on all measured dimensions in order to demonstrate improvement of condition    Time 2   Period Weeks   Status Achieved     PT SHORT TERM GOAL #2   Title Patient will verbally be able to verbally state at least 3 methods of foot care/ways to prevent further wound  formation in order to prevent wound recurrence    Time 2   Period Weeks   Status Achieved           PT Long Term Goals - 04/06/16 1310      PT LONG TERM GOAL #1   Title Patient to be able to wear all pairs of shoes without exacerbation of R great toe in order to improve QOL and recurrence of wound    Time 4   Period Weeks   Status Partially Met     PT LONG TERM GOAL #2   Title Patient to display full wound closure in order to demonstrate resolution of condition    Time 4   Period Weeks   Status On-going             Patient will benefit from skilled therapeutic intervention in order to improve the following deficits and impairments:     Visit Diagnosis:  Unspecified superficial injury of right great toe, sequela  Difficulty in walking, not elsewhere classified  Unsteadiness on feet     Problem List Patient Active Problem List   Diagnosis Date Noted  . Nausea & vomiting 02/24/2016  . A-fib ([place]) 02/23/2016  . Hypothyroidism 02/23/2016  . CKD (chronic kidney disease), stage III 02/23/2016  . Anxiety 02/23/2016  . Chronic combined systolic (congestive) and diastolic (congestive) heart failure ([place]) 02/23/2016  . Dizziness 02/23/2016  . Open wound of right foot 02/23/2016  . Rectal bleeding 07/28/2015  . Chronic anticoagulation 07/28/2015  . PVD (peripheral vascular disease) ([place])   . S/P angioplasty 04/23/2015  . Peripheral arterial occlusive disease ([place]) 04/23/2015  . PAD (peripheral artery disease) ([place])   . Critical lower limb ischemia   . Toe ulcer, right ([place])   . Type II or unspecified type diabetes mellitus without mention of complication, uncontrolled 11/27/2013  . Acute respiratory failure with hypoxia ([place]) 11/26/2013  . CAP (community acquired pneumonia) 11/26/2013  . CHF exacerbation ([place]) 11/25/2013  . Cardiomyopathy, ischemic 06/13/2013  . At risk for sudden cardiac death 09-Jun-2013  . S/P cardiac catheterization, Rt & Lt heart cath  06/05/2013 06-09-13  . Acute on chronic combined systolic and diastolic HF (heart failure), NYHA class 3 ([place]) 06/04/2013  . Atrial flutter with rapid ventricular response ([place]) 06/02/2013  . Acute renal failure: Cr up to 1.8 06/02/2013  . Hyperkalemia 06/02/2013    Class: Acute  . LBBB (left bundle branch block) 06/01/2013  . Hypotension- secondary to AF and Diltiazem Rx 06/01/2013  . Obesity (BMI 30-39.9) 06/01/2013  . Acute on chronic diastolic heart failure - due to Afib AVR 06/01/2013  . Atrial fibrillation with RVR- converted with Amiodarone 05/31/2013  . Stroke- Lt brain 2003 04/08/2013  . Hemiplegia affecting right dominant side ([place]) 04/08/2013  . Cataract 04/08/2013  . GERD (gastroesophageal reflux disease) 04/08/2013  . S/P CABG x 3 - 2010 04/08/2013  . S/P AVR-tissue, 2010 04/08/2013  . DM2 (diabetes mellitus, type 2) ([place]) 04/08/2013  . Dyslipidemia 04/08/2013  . SUBACROMIAL BURSITIS, LEFT 11/05/2009   Ihor Austin, LPTA; CBIS [phone]  Aldona Lento 04/12/2016, 9:07 AM  [place], [name], [address] Phone: [phone]   Fax:  [phone]  Name: [name] MRN: [id number] Date of Birth: 25-Dec-1945

## 2016-04-12 NOTE — Telephone Encounter (Signed)
He never followed up with me from the hospital. I would like to see him in the office. He should not take azithromycin with amiodarone. I would advise a different antibiotic. We can discuss stopping the amiodarone when he comes back to the office.  Dr. Lemmie Evens

## 2016-04-12 NOTE — Telephone Encounter (Signed)
Returned call to patient no answer.LMTC. 

## 2016-04-12 NOTE — Telephone Encounter (Signed)
Estill Bamberg ( [facility] ) is calling about Amiodarone , because they were told that the patient was not supposed to taking Amiodarone and they get a prescription for it . Need to clarify . Please call   Thanks

## 2016-04-12 NOTE — Telephone Encounter (Signed)
Spoke to patient.Dr.Hilty's advice given.Post hospital appointment scheduled with Dr.Hilty 04/19/16 at 11:45 am at [facility] office.

## 2016-04-14 ENCOUNTER — Ambulatory Visit (HOSPITAL_COMMUNITY): Payer: Medicare Other | Admitting: Physical Therapy

## 2016-04-14 ENCOUNTER — Encounter (INDEPENDENT_AMBULATORY_CARE_PROVIDER_SITE_OTHER): Payer: Self-pay | Admitting: Internal Medicine

## 2016-04-14 ENCOUNTER — Other Ambulatory Visit (INDEPENDENT_AMBULATORY_CARE_PROVIDER_SITE_OTHER): Payer: Self-pay | Admitting: Internal Medicine

## 2016-04-14 ENCOUNTER — Encounter (INDEPENDENT_AMBULATORY_CARE_PROVIDER_SITE_OTHER): Payer: Self-pay | Admitting: *Deleted

## 2016-04-14 ENCOUNTER — Ambulatory Visit (INDEPENDENT_AMBULATORY_CARE_PROVIDER_SITE_OTHER): Payer: Medicare Other | Admitting: Internal Medicine

## 2016-04-14 VITALS — BP 104/70 | HR 60 | Temp 97.7°F | Ht 70.0 in | Wt 255.1 lb

## 2016-04-14 DIAGNOSIS — R262 Difficulty in walking, not elsewhere classified: Secondary | ICD-10-CM

## 2016-04-14 DIAGNOSIS — L97513 Non-pressure chronic ulcer of other part of right foot with necrosis of muscle: Secondary | ICD-10-CM | POA: Diagnosis not present

## 2016-04-14 DIAGNOSIS — K625 Hemorrhage of anus and rectum: Secondary | ICD-10-CM | POA: Diagnosis not present

## 2016-04-14 DIAGNOSIS — S90931S Unspecified superficial injury of right great toe, sequela: Secondary | ICD-10-CM | POA: Diagnosis not present

## 2016-04-14 DIAGNOSIS — R2681 Unsteadiness on feet: Secondary | ICD-10-CM | POA: Diagnosis not present

## 2016-04-14 DIAGNOSIS — R748 Abnormal levels of other serum enzymes: Secondary | ICD-10-CM | POA: Diagnosis not present

## 2016-04-14 LAB — HEPATIC FUNCTION PANEL
ALT: 52 U/L — ABNORMAL HIGH (ref 9–46)
AST: 41 U/L — ABNORMAL HIGH (ref 10–35)
Albumin: 4.3 g/dL (ref 3.6–5.1)
Alkaline Phosphatase: 39 U/L — ABNORMAL LOW (ref 40–115)
BILIRUBIN DIRECT: 0.1 mg/dL (ref <=0.2)
BILIRUBIN INDIRECT: 0.4 mg/dL (ref 0.2–1.2)
Total Bilirubin: 0.5 mg/dL (ref 0.2–1.2)
Total Protein: 6.6 g/dL (ref 6.1–8.1)

## 2016-04-14 NOTE — Progress Notes (Addendum)
Subjective:    Patient ID: [name], male    DOB: 1945/09/28, 70 y.o.   MRN: [id number]  HPI Referred by Dr Gerarda Fraction for rectal bleeding. He tells me sometimes he passes blood with stool. Symptoms for a year or more.  BMs are usually not hard. He says he will see blood when his stools are hard. Sometimes he has to strain. He has a BM 1-2 a day. No abdominal pain. No other change in his stool except his rectal bleeding.  Appetite is good. No weight loss.      Hx of atril fib and maintained on Eliquis and ASA  Hx of CVA in 2003 Last colonoscopy in 2007 by Dr. Arnoldo Morale.   03/04/2016 H and H 14.0 and 41.8, MCV 87, TIBC 428, Iron 90,ALP 55, AST 57, ALT 71, total bili 0.3 Review of Systems Past Medical History:  Diagnosis Date  . At risk for sudden cardiac death 07-02-13  . Cataract   . Chronic anticoagulation 05/2013   started  . Chronic kidney disease    Patient reports he is seeing Kidney doctor in September  . Coronary artery disease   . Diabetes mellitus    type 2   . Dyslipidemia   . GERD (gastroesophageal reflux disease)   . History of valve replacement 2010   [facility] Ease bioprosthetic 75mm  . Hypertension   . PAF (paroxysmal atrial fibrillation) ([place]) 05/2013   converted with amiodarone to SR  . S/P CABG x 3 2010  . S/P cardiac catheterization, Rt & Lt heart cath 06/05/2013 07/02/13  . Stroke [facility]) 2003   R-sided weakness & upper extremity swelling   . Thyroid disease     Past Surgical History:  Procedure Laterality Date  . AORTIC VALVE REPLACEMENT  01/26/2009   [facility] Ease bioprosthetic 79mm valve  . CARDIAC CATHETERIZATION  06/05/2013   native 3 vessel disease; patent LIMA to LAD, SVG to OM and SVG to PDA; Elevated right and left heart filling pressures, with predominantly pulmonary venous hypertension, normal PVR  . COLONOSCOPY  2007   Dr. Aviva Signs: internal hemorrhoids  . CORONARY ARTERY BYPASS GRAFT  01/26/2009   LIMA to LAD, SVG to  circumflex, SVG to PDA  . GRAFT(S) ANGIOGRAM  06/05/2013   Procedure: GRAFT(S) Cyril Loosen;  Surgeon: Leonie Man, MD;  Location: [facility] C[facility] LAB;  Service: Cardiovascular;;  . LEFT AND RIGHT HEART CATHETERIZATION WITH CORONARY ANGIOGRAM N/A 06/05/2013   Procedure: LEFT AND RIGHT HEART CATHETERIZATION WITH CORONARY ANGIOGRAM;  Surgeon: Leonie Man, MD;  Location: [facility];  Service: Cardiovascular;  Laterality: N/A;  . TRANSTHORACIC ECHOCARDIOGRAM  09/2011   grade 1 diastolic dysfunction; increasing valve gradient; calcified MV annulus   . TRANSTHORACIC ECHOCARDIOGRAM  06/03/2013   EF 25-30%, mod conc. hypertrophy, grade 3 diastolic dysfunction; LA mod dilated; calcified MV annulus; transaortic gradients are normal for the bioprosthetic valve; inf vena cava dilated (elevated CVP) - LifeVest    No Known Allergies  Current Outpatient Prescriptions on File Prior to Visit  Medication Sig Dispense Refill  . acetaminophen (TYLENOL) 500 MG tablet Take 1,000 mg by mouth 2 (two) times daily as needed for headache. For pain/headaches    . ALPRAZolam (XANAX) 1 MG tablet Take 1 mg by mouth 3 (three) times daily.     Marland Kitchen amiodarone (PACERONE) 200 MG tablet TAKE ONE TABLET EACH DAY. 120 tablet 3  . atorvastatin (LIPITOR) 20 MG tablet Take 1 tablet (20 mg total) by  mouth daily at 6 PM. 90 tablet 1  . ELIQUIS 5 MG TABS tablet TAKE ONE TABLET TWICE DAILY (Patient taking differently: Take 5 mg by mouth twice daily) 180 tablet 1  . fenofibrate (TRICOR) 145 MG tablet TAKE ONE (1) TABLET BY MOUTH EVERY DAY 30 tablet 5  . ferrous sulfate 325 (65 FE) MG EC tablet Take 325 mg by mouth daily with breakfast.    . insulin glargine (LANTUS) 100 UNIT/ML injection Inject 0.6-0.75 mLs (60-75 Units total) into the skin 2 (two) times daily. 75 units in the morning then 60 units at bedtime    . insulin lispro (HUMALOG) 100 UNIT/ML injection Inject 0.35-0.5 mLs (35-50 Units total) into the skin 3 (three) times daily before  meals. Patient states that he takes 35 units at breakfast, 50 units at dinner and 50 units at supper.    . metoprolol succinate (TOPROL-XL) 25 MG 24 hr tablet TAKE ONE (1) TABLET EACH DAY 30 tablet 8  . pantoprazole (PROTONIX) 40 MG tablet Take 1 tablet (40 mg total) by mouth 2 (two) times daily. 180 tablet 1  . torsemide (DEMADEX) 10 MG tablet Take 1 tablet (10 mg total) by mouth daily. 30 tablet 11   No current facility-administered medications on file prior to visit.        Objective:   Physical Exam Blood pressure 104/70, pulse 60, temperature 97.7 F (36.5 C), height 5\' 10"  (1.778 m), weight 255 lb 1.6 oz (115.7 kg).  Alert and oriented. Skin warm and dry. Oral mucosa is moist.   . Sclera anicteric, conjunctivae is pink. Thyroid not enlarged. No cervical lymphadenopathy. Lungs clear. Heart regular rate and rhythm.  Abdomen is soft. Bowel sounds are positive. No hepatomegaly. No abdominal masses felt. No tenderness.  No edema to lower extremities. Stool brown and guaiac negative.          Assessment & Plan:  Rectal bleeding.  Colonoscopy: The risks and benefits such as perforation, bleeding, and infection were reviewed with the patient and is agreeable. Elevated liver enzymes: Repeat hepatic function

## 2016-04-14 NOTE — Therapy (Signed)
[place], [name], [address] Phone: [phone]   Fax:  [phone]  Wound Care Therapy  Patient Details  Name: [name] MRN: [id number] Date of Birth: November 23, 1945 No Data Recorded  Encounter Date: 04/14/2016      PT End of Session - 04/14/16 0920    Visit Number 12   Number of Visits 12   Date for PT Re-Evaluation 04/21/16   Authorization Type UHC Medicare Gcodes updated on visit #7   Authorization Time Period 03/24/16 to 05/12/16   Authorization - Visit Number 12   Authorization - Number of Visits 12   PT Start Time 0815   PT Stop Time 0830   PT Time Calculation (min) 15 min   Activity Tolerance Patient tolerated treatment well   Behavior During Therapy [facility] for tasks assessed/performed      Past Medical History:  Diagnosis Date  . At risk for sudden cardiac death Jun 21, 2013  . Cataract   . Chronic anticoagulation 05/2013   started  . Chronic kidney disease    Patient reports he is seeing Kidney doctor in September  . Coronary artery disease   . Diabetes mellitus    type 2   . Dyslipidemia   . GERD (gastroesophageal reflux disease)   . History of valve replacement 2010   [facility] Ease bioprosthetic 59m  . Hypertension   . PAF (paroxysmal atrial fibrillation) ([place] 05/2013   converted with amiodarone to SR  . S/P CABG x 3 2010  . S/P cardiac catheterization, Rt & Lt heart cath 06/05/2013 111/07/14 . Stroke ([facility] 2003   R-sided weakness & upper extremity swelling   . Thyroid disease     Past Surgical History:  Procedure Laterality Date  . AORTIC VALVE REPLACEMENT  01/26/2009   [facility]  . CARDIAC CATHETERIZATION  06/05/2013   native 3 vessel disease; patent LIMA to LAD, SVG to OM and SVG to PDA; Elevated right and left heart filling pressures, with predominantly pulmonary venous hypertension, normal PVR  . COLONOSCOPY  2007   Dr. MaAviva Signsinternal hemorrhoids  . CORONARY ARTERY BYPASS GRAFT  01/26/2009   LIMA to LAD, SVG to circumflex, SVG to PDA  . GRAFT(S) ANGIOGRAM  06/05/2013   Procedure: GRAFT(S) ANCyril Loosen Surgeon: DaLeonie ManMD;  [facility];  Service: Cardiovascular;;  . LEFT AND RIGHT HEART CATHETERIZATION WITH CORONARY ANGIOGRAM N/A 06/05/2013   Procedure: LEFT AND RIGHT HEART CATHETERIZATION WITH CORONARY ANGIOGRAM;  Surgeon: DaLeonie ManMD;  [facility];  Service: Cardiovascular;  Laterality: N/A;  . TRANSTHORACIC ECHOCARDIOGRAM  09/2011   grade 1 diastolic dysfunction; increasing valve gradient; calcified MV annulus   . TRANSTHORACIC ECHOCARDIOGRAM  06/03/2013   EF 25-30%, mod conc. hypertrophy, grade 3 diastolic dysfunction; LA mod dilated; calcified MV annulus; transaortic gradients are normal for the bioprosthetic valve; inf vena cava dilated (elevated CVP) - LifeVest    There were no vitals filed for this visit.                  Wound Therapy - 04/14/16 0917    Subjective Pain free, dressings intact.   Pain Assessment No/denies pain   Wound Properties Date First Assessed: 02/23/16 Wound Type: Other (Comment) , open area R great toe   Location: Other (Comment) , Medial R great toe   Location Orientation: Right;Medial Present on Admission: Yes   Dressing Type  Impregnated gauze (petrolatum);Gauze (Comment);Tape dressing   Dressing Changed New   Dressing Status Intact   % Wound base Red or Granulating 100%   Peri-wound Assessment Other (Comment)  callous   Margins Attached edges (approximated)   Drainage Amount None   Treatment Cleansed;Debridement (Selective)   Incision Properties Date First Assessed: 04/23/15 Time First Assessed: 1738 Location: Groin Location Orientation: Left Present on Admission: No   Selective Debridement - Location callous perimeter of wound   Selective Debridement - Tools Used Forceps;Scissors   Selective Debridement - Tissue  Removed callous   Wound Therapy - Clinical Statement Pt no longer with open area or signs of skin eruption.  Removed some calloused skin perimeter of wound.  Evaluating therapist present and recommended discharge. Instructed patient to dry foot well after showering, moisturize and prevent friction to the area via bandaid or other cushioning.  Pt verbalized understanding.     Wound Plan discharge to home maintenance.                    PT Short Term Goals - 2016/05/09 3662      PT SHORT TERM GOAL #1   Title Patient to demonstrate reduction of wound area by at least 50% on all measured dimensions in order to demonstrate improvement of condition    Time 2   Period Weeks   Status Achieved     PT SHORT TERM GOAL #2   Title Patient will verbally be able to verbally state at least 3 methods of foot care/ways to prevent further wound formation in order to prevent wound recurrence    Time 2   Period Weeks   Status Achieved           PT Long Term Goals - 09-May-2016 9476      PT LONG TERM GOAL #1   Title Patient to be able to wear all pairs of shoes without exacerbation of R great toe in order to improve QOL and recurrence of wound    Time 4   Period Weeks   Status Achieved     PT LONG TERM GOAL #2   Title Patient to display full wound closure in order to demonstrate resolution of condition    Time 4   Period Weeks   Status Achieved             Patient will benefit from skilled therapeutic intervention in order to improve the following deficits and impairments:     Visit Diagnosis: Unspecified superficial injury of right great toe, sequela  Difficulty in walking, not elsewhere classified  Unsteadiness on feet  Non-pressure chronic ulcer of other part of right foot with necrosis of muscle ([place])      G-Codes - 2016/05/09 1622    Functional Assessment Tool Used clinical judgement wound size, drainage   Functional Limitation Other PT primary   Other PT Primary  Goal Status (L4650) At least 1 percent but less than 20 percent impaired, limited or restricted   Other PT Primary Discharge Status (P5465) At least 1 percent but less than 20 percent impaired, limited or restricted       Problem List Patient Active Problem List   Diagnosis Date Noted  . Nausea & vomiting 02/24/2016  . A-fib ([place]) 02/23/2016  . Hypothyroidism 02/23/2016  . CKD (chronic kidney disease), stage III 02/23/2016  . Anxiety 02/23/2016  . Chronic combined systolic (congestive) and diastolic (congestive) heart failure ([place]) 02/23/2016  . Dizziness 02/23/2016  . Open wound of  right foot 02/23/2016  . Rectal bleeding 07/28/2015  . Chronic anticoagulation 07/28/2015  . PVD (peripheral vascular disease) ([place])   . S/P angioplasty 04/23/2015  . Peripheral arterial occlusive disease ([place]) 04/23/2015  . PAD (peripheral artery disease) ([place])   . Critical lower limb ischemia   . Toe ulcer, right ([place])   . Type II or unspecified type diabetes mellitus without mention of complication, uncontrolled 11/27/2013  . Acute respiratory failure with hypoxia ([place]) 11/26/2013  . CAP (community acquired pneumonia) 11/26/2013  . CHF exacerbation ([place]) 11/25/2013  . Cardiomyopathy, ischemic 06/13/2013  . At risk for sudden cardiac death 2013-06-12  . S/P cardiac catheterization, Rt & Lt heart cath 06/05/2013 2013-06-12  . Acute on chronic combined systolic and diastolic HF (heart failure), NYHA class 3 ([name]) 06/04/2013  . Atrial flutter with rapid ventricular response ([place]) 06/02/2013  . Acute renal failure: Cr up to 1.8 06/02/2013  . Hyperkalemia 06/02/2013    Class: Acute  . LBBB (left bundle branch block) 06/01/2013  . Hypotension- secondary to AF and Diltiazem Rx 06/01/2013  . Obesity (BMI 30-39.9) 06/01/2013  . Acute on chronic diastolic heart failure - due to Afib AVR 06/01/2013  . Atrial fibrillation with RVR- converted with Amiodarone 05/31/2013  . Stroke- Lt brain 2003 04/08/2013   . Hemiplegia affecting right dominant side ([place]) 04/08/2013  . Cataract 04/08/2013  . GERD (gastroesophageal reflux disease) 04/08/2013  . S/P CABG x 3 - 2010 04/08/2013  . S/P AVR-tissue, 2010 04/08/2013  . DM2 (diabetes mellitus, type 2) ([place]) 04/08/2013  . Dyslipidemia 04/08/2013  . SUBACROMIAL BURSITIS, LEFT 11/05/2009    Teena Irani, PTA/CLT [phone]  04/14/2016, 4:23 PM  [place] [address] [place], [name], [address] Phone: [phone]   Fax:  [phone]  Name: [name] MRN: [id number] Date of Birth: 11-10-1945  PHYSICAL THERAPY DISCHARGE SUMMARY  Visits from Start of Care: 12  Current functional level related to goals / functional outcomes: As above Remaining deficits: none   Education / Equipment: Keep bandaid on toe for a week so skin will not have friction Plan: Patient agrees to discharge.  Patient goals were met. Patient is being discharged due to meeting the stated rehab goals.  ?????        Rayetta Humphrey, [place] CLT [phone]

## 2016-04-14 NOTE — Patient Instructions (Signed)
Colonoscopy. The risks and benefits such as perforation, bleeding, and infection were reviewed with the patient and is agreeable. Elevated liver enzymes: repeat hepatic function

## 2016-04-15 ENCOUNTER — Encounter (INDEPENDENT_AMBULATORY_CARE_PROVIDER_SITE_OTHER): Payer: Self-pay

## 2016-04-19 ENCOUNTER — Ambulatory Visit (HOSPITAL_COMMUNITY): Payer: Medicare Other | Admitting: Physical Therapy

## 2016-04-19 ENCOUNTER — Ambulatory Visit: Payer: Medicare Other | Admitting: Internal Medicine

## 2016-04-21 ENCOUNTER — Other Ambulatory Visit: Payer: Medicare Other

## 2016-04-21 ENCOUNTER — Ambulatory Visit (HOSPITAL_COMMUNITY): Payer: Medicare Other | Admitting: Physical Therapy

## 2016-04-25 ENCOUNTER — Other Ambulatory Visit (INDEPENDENT_AMBULATORY_CARE_PROVIDER_SITE_OTHER): Payer: Self-pay | Admitting: *Deleted

## 2016-04-25 DIAGNOSIS — R748 Abnormal levels of other serum enzymes: Secondary | ICD-10-CM

## 2016-04-26 ENCOUNTER — Ambulatory Visit (HOSPITAL_COMMUNITY): Payer: Medicare Other | Admitting: Physical Therapy

## 2016-04-28 ENCOUNTER — Ambulatory Visit (HOSPITAL_COMMUNITY): Payer: Medicare Other | Admitting: Physical Therapy

## 2016-05-03 ENCOUNTER — Ambulatory Visit (HOSPITAL_COMMUNITY): Payer: Medicare Other | Admitting: Physical Therapy

## 2016-05-05 ENCOUNTER — Ambulatory Visit (HOSPITAL_COMMUNITY): Payer: Medicare Other | Admitting: Physical Therapy

## 2016-05-09 ENCOUNTER — Ambulatory Visit (HOSPITAL_COMMUNITY): Payer: Medicare Other | Attending: Orthopaedic Surgery | Admitting: Physical Therapy

## 2016-05-09 DIAGNOSIS — R262 Difficulty in walking, not elsewhere classified: Secondary | ICD-10-CM | POA: Insufficient documentation

## 2016-05-09 DIAGNOSIS — S90931S Unspecified superficial injury of right great toe, sequela: Secondary | ICD-10-CM | POA: Diagnosis not present

## 2016-05-09 NOTE — Therapy (Signed)
[place], [name], [address] Phone: [phone]   Fax:  [phone]  Wound Care Evaluation  Patient Details  Name: [name] MRN: [id number] Date of Birth: 01-19-1946 No Data Recorded  Encounter Date: 05/09/2016      PT End of Session - 05/09/16 1222    Visit Number 1   Number of Visits 16   Date for PT Re-Evaluation 06/13/16   Authorization Type [employer] Medicare (need to charge KX moving forward)   Authorization Time Period 05/09/16 to 07/09/16   Authorization - Visit Number 1   Authorization - Number of Visits 10   PT Start Time 1118   PT Stop Time 1145  simple wound    PT Time Calculation (min) 27 min   Activity Tolerance Patient tolerated treatment well   Behavior During Therapy [facility] for tasks assessed/performed      Past Medical History:  Diagnosis Date  . At risk for sudden cardiac death 06/20/2013  . Cataract   . Chronic anticoagulation 05/2013   started  . Chronic kidney disease    Patient reports he is seeing Kidney doctor in September  . Coronary artery disease   . Diabetes mellitus    type 2   . Dyslipidemia   . GERD (gastroesophageal reflux disease)   . History of valve replacement 2010   [facility] Ease bioprosthetic 44mm  . Hypertension   . PAF (paroxysmal atrial fibrillation) ([place]) 05/2013   converted with amiodarone to SR  . S/P CABG x 3 2010  . S/P cardiac catheterization, Rt & Lt heart cath 06/05/2013 06-20-2013  . Stroke [facility]) 2003   R-sided weakness & upper extremity swelling   . Thyroid disease     Past Surgical History:  Procedure Laterality Date  . AORTIC VALVE REPLACEMENT  01/26/2009   [facility] Ease bioprosthetic 48mm valve  . CARDIAC CATHETERIZATION  06/05/2013   native 3 vessel disease; patent LIMA to LAD, SVG to OM and SVG to PDA; Elevated right and left heart filling pressures, with predominantly pulmonary venous hypertension, normal PVR  . COLONOSCOPY   2007   Dr. Aviva Signs: internal hemorrhoids  . CORONARY ARTERY BYPASS GRAFT  01/26/2009   LIMA to LAD, SVG to circumflex, SVG to PDA  . GRAFT(S) ANGIOGRAM  06/05/2013   Procedure: GRAFT(S) Cyril Loosen;  Surgeon: Leonie Man, MD;  Location: [facility] C[facility] LAB;  Service: Cardiovascular;;  . LEFT AND RIGHT HEART CATHETERIZATION WITH CORONARY ANGIOGRAM N/A 06/05/2013   Procedure: LEFT AND RIGHT HEART CATHETERIZATION WITH CORONARY ANGIOGRAM;  Surgeon: Leonie Man, MD;  Location: [facility] CATH LAB;  Service: Cardiovascular;  Laterality: N/A;  . TRANSTHORACIC ECHOCARDIOGRAM  09/2011   grade 1 diastolic dysfunction; increasing valve gradient; calcified MV annulus   . TRANSTHORACIC ECHOCARDIOGRAM  06/03/2013   EF 25-30%, mod conc. hypertrophy, grade 3 diastolic dysfunction; LA mod dilated; calcified MV annulus; transaortic gradients are normal for the bioprosthetic valve; inf vena cava dilated (elevated CVP) - LifeVest    There were no vitals filed for this visit.         Wound Therapy - 05/09/16 1204    Subjective Patient arrives today stating he really does not know where his new wound came from, he states he has been wearing his diabetic shoes. His lowest blood sugars have been 124 and his highest, also his current, were around 300. He has been putting medihoney on his wound but arrived with wound open and in  sandals today    Patient and Family Stated Goals wound to heal    Date of Onset --  unsure    Prior Treatments has received wound PT at this clinic multiple times    Pain Assessment No/denies pain   Evaluation and Treatment Procedures Explained to Patient/Family Yes   Evaluation and Treatment Procedures agreed to   Wound Properties Date First Assessed: 05/09/16 Time First Assessed: 1206 Wound Type: Other (Comment) , R great toe wound   Location: Other (Comment) , R great toe   Location Orientation: Right Wound Description (Comments): R great toe  Present on Admission: Yes   Dressing Type None    Dressing Changed New   Dressing Status None   Dressing Change Frequency PRN   % Wound base Red or Granulating 50%   % Wound base Other (Comment) 50%  white-yellow slough, very adherent    Wound Length (cm) 0.5 cm   Wound Width (cm) 1.1 cm   Wound Depth (cm) 0.1 cm   Undermining (cm) approx 0.05cm surrounding wound    Margins Unattached edges (unapproximated)   Closure None   Drainage Amount None   Drainage Description No odor   Treatment Debridement (Selective);Cleansed;Packing (Impregnated strip);Tape changed;Pressure applied   Wound Properties Date First Assessed: 02/23/16 Wound Type: Other (Comment) , open area R great toe   Location: Other (Comment) , Medial R great toe   Location Orientation: Right;Medial Present on Admission: Yes   Incision Properties Date First Assessed: 04/23/15 Time First Assessed: 1738 Location: Groin Location Orientation: Left Present on Admission: No   Selective Debridement - Location callous perimeter of wound, dead skin in wound   attempted slough but very adherent    Selective Debridement - Tools Used Forceps;Scalpel   Selective Debridement - Tissue Removed callous, dead skin, slough    Wound Therapy - Clinical Statement Patient arrives with a new open wound on R great toe, which he reports he is unsure of origin. Wound is approximately 50% granulated tissue and 50% white-yellow slough, very adherent. Debrided dead skin and callous surrounding wound, attempted slough however very adherent today. Applied medihoney, gauze, medipore tape today. Patient states his sugars remain high, around 300 sometimes, and that he does not always wear socks, showing poor compliance with diabetic foot/wound care/awareness. Recommmend skilled PT services to adddress this new wound and facilitate full wound healing.    Wound Therapy - Functional Problem List dificulty walking, dificulty wearing shoes    Factors Delaying/Impairing Wound Healing Altered sensation;Diabetes  Mellitus;Infection - systemic/local;Immobility;Multiple medical problems;Vascular compromise   Hydrotherapy Plan Debridement;Dressing change;Patient/family education   Wound Therapy - Frequency Other (comment)  2x/week    Wound Therapy - Current Recommendations PT   Wound Plan medihoney, debridement, tape                          PT Education - 05/09/16 1221    Education provided Yes   Education Details importance of managing blood sugars and proper wear of socks/diabetic shoes, POC    Person(s) Educated Patient   Methods Explanation   Comprehension Verbalized understanding          PT Short Term Goals - 05/09/16 1223      PT SHORT TERM GOAL #1   Title Patient to demonstrate reduction of wound area by at least 50% on all measured dimensions in order to demonstrate improvement of condition    Time 4   Period Weeks   Status  New     PT SHORT TERM GOAL #2   Title Patient will verbally be able to verbally state at least 3 methods of foot care/ways to prevent further wound formation in order to prevent wound recurrence    Time 4   Period Weeks   Status New           PT Long Term Goals - 05/09/16 1224      PT LONG TERM GOAL #1   Title Patient to be able to wear all pairs of shoes without exacerbation of R great toe in order to improve QOL and recurrence of wound    Time 8   Period Weeks   Status New     PT LONG TERM GOAL #2   Title Patient to display full wound closure in order to demonstrate resolution of condition    Time 8   Period Weeks   Status New     PT LONG TERM GOAL #3   Title Patient to be able to verbally state the importance of maintaining blood sugars in relation to proper wound healing and general health status    Time 8   Period Weeks   Status New            Patient will benefit from skilled therapeutic intervention in order to improve the following deficits and impairments:     Visit Diagnosis: Unspecified superficial  injury of right great toe, sequela - Plan: PT plan of care cert/re-cert  Difficulty in walking, not elsewhere classified - Plan: PT plan of care cert/re-cert      G-Codes - [id number] 1225    Functional Assessment Tool Used Based on skilled clinical assessment of wound size, slough, self care    Functional Limitation Other PT primary   Other PT Primary Current Status [id number]) At least 40 percent but less than 60 percent impaired, limited or restricted   Other PT Primary Goal Status [id number]) At least 20 percent but less than 40 percent impaired, limited or restricted      Problem List Patient Active Problem List   Diagnosis Date Noted  . Nausea & vomiting 02/24/2016  . A-fib ([place]) 02/23/2016  . Hypothyroidism 02/23/2016  . CKD (chronic kidney disease), stage III 02/23/2016  . Anxiety 02/23/2016  . Chronic combined systolic (congestive) and diastolic (congestive) heart failure ([place]) 02/23/2016  . Dizziness 02/23/2016  . Open wound of right foot 02/23/2016  . Rectal bleeding 07/28/2015  . Chronic anticoagulation 07/28/2015  . PVD (peripheral vascular disease) ([place])   . S/P angioplasty 04/23/2015  . Peripheral arterial occlusive disease ([place]) 04/23/2015  . PAD (peripheral artery disease) ([place])   . Critical lower limb ischemia   . Toe ulcer, right ([place])   . Type II or unspecified type diabetes mellitus without mention of complication, uncontrolled 11/27/2013  . Acute respiratory failure with hypoxia ([place]) 11/26/2013  . CAP (community acquired pneumonia) 11/26/2013  . CHF exacerbation ([place]) 11/25/2013  . Cardiomyopathy, ischemic 06/13/2013  . At risk for sudden cardiac death 06/28/2013  . S/P cardiac catheterization, Rt & Lt heart cath 06/05/2013 Jun 28, 2013  . Acute on chronic combined systolic and diastolic HF (heart failure), NYHA class 3 ([place]) 06/04/2013  . Atrial flutter with rapid ventricular response ([place]) 06/02/2013  . Acute renal failure: Cr up to 1.8 06/02/2013  .  Hyperkalemia 06/02/2013    Class: Acute  . LBBB (left bundle branch block) 06/01/2013  . Hypotension- secondary to AF and Diltiazem Rx 06/01/2013  . Obesity (BMI  30-39.9) 06/01/2013  . Acute on chronic diastolic heart failure - due to Afib AVR 06/01/2013  . Atrial fibrillation with RVR- converted with Amiodarone 05/31/2013  . Stroke- Lt brain 2003 04/08/2013  . Hemiplegia affecting right dominant side ([place]) 04/08/2013  . Cataract 04/08/2013  . GERD (gastroesophageal reflux disease) 04/08/2013  . S/P CABG x 3 - 2010 04/08/2013  . S/P AVR-tissue, 2010 04/08/2013  . DM2 (diabetes mellitus, type 2) ([place]) 04/08/2013  . Dyslipidemia 04/08/2013  . SUBACROMIAL BURSITIS, LEFT 11/05/2009    Deniece Ree PT, DPT [place] [address] [place], [name], [address] Phone: [phone]   Fax:  [phone]  Name: WHITTEN ARNEY MRN: [id number] Date of Birth: 04-09-1946

## 2016-05-10 ENCOUNTER — Ambulatory Visit (HOSPITAL_COMMUNITY): Payer: Medicare Other | Admitting: Physical Therapy

## 2016-05-12 ENCOUNTER — Ambulatory Visit
Admission: RE | Admit: 2016-05-12 | Discharge: 2016-05-12 | Disposition: A | Discharge status: Home / Self Care | Payer: Medicare Other | Source: Ambulatory Visit | Attending: Interventional Radiology | Admitting: Interventional Radiology

## 2016-05-12 ENCOUNTER — Ambulatory Visit: Payer: Self-pay | Admitting: "Endocrinology

## 2016-05-12 ENCOUNTER — Ambulatory Visit (HOSPITAL_COMMUNITY): Payer: Medicare Other | Admitting: Physical Therapy

## 2016-05-12 DIAGNOSIS — I739 Peripheral vascular disease, unspecified: Secondary | ICD-10-CM

## 2016-05-12 DIAGNOSIS — S91109D Unspecified open wound of unspecified toe(s) without damage to nail, subsequent encounter: Secondary | ICD-10-CM | POA: Diagnosis not present

## 2016-05-12 DIAGNOSIS — S91219A Laceration without foreign body of unspecified toe(s) with damage to nail, initial encounter: Secondary | ICD-10-CM | POA: Diagnosis not present

## 2016-05-12 HISTORY — PX: IR GENERIC HISTORICAL: IMG1180011

## 2016-05-12 NOTE — Progress Notes (Signed)
Chief Complaint: Patient was seen in consultation today for  Chief Complaint  Patient presents with  . Follow-up   at the request of [place]  Referring Physician(s): Ahnna Dungan  History of Present Illness: [name] is a 70 y.o. male with history of diabetes,  hypertension, prior stroke, paroxysmal atrial fibrillation and chronic kidney disease. He also has history of category 5 critical limb ischemia involving the right lower extremity with a slowly healing ulcer on the right great toe. On 04/23/15 he underwent right lower extremity arteriogram with recanalization of the right SFA occlusion and angioplasty/stenting of the right SFA and popliteal artery. On 10/07/15 he underwent atherectomy and drug-eluting balloon angioplasty of the distal SFA/popliteal stenosis as well as atherectomy and angioplasty of a peroneal artery stenosis. There was unsuccessful attempts at recannalization of chronically occluded anterior tibial /posterior tibial arteries on the right.   He was last seen by on 01/14/2016 and at that time had significant interval healing in the right great toe ulcer with improved sensation to the toe and bleeding during debridement at wound care center. He presents today for additional follow-up and ABI/right lower extremity. Since his last visit the patient has done well. He continues to attend wound center once weekly. The medial great toe ulcer has resolved. However, he has a new ulcer at the tip of the toe which measures approximately 6 mm in diameter. He is currently on Plavix and Eliquis therapy. He does have what appears to be a left second hammertoe.  He complains of burning nerve pain along the medial gait or distribution of the right lower extremity. This is intermittent.  Past Medical History:  Diagnosis Date  . At risk for sudden cardiac death 06/27/13  . Cataract   . Chronic anticoagulation 05/2013   started  . Chronic kidney disease    Patient reports he is seeing Kidney doctor in September  . Coronary artery disease   . Diabetes mellitus    type 2   . Dyslipidemia   . GERD (gastroesophageal reflux disease)   . History of valve replacement 2010   [facility] Ease bioprosthetic 19mm  . Hypertension   . PAF (paroxysmal atrial fibrillation) ([place]) 05/2013   converted with amiodarone to SR  . S/P CABG x 3 2010  . S/P cardiac catheterization, Rt & Lt heart cath 06/05/2013 Jun 27, 2013  . Stroke [facility]) 2003   R-sided weakness & upper extremity swelling   . Thyroid disease     Past Surgical History:  Procedure Laterality Date  . AORTIC VALVE REPLACEMENT  01/26/2009   [facility] Ease bioprosthetic 23mm valve  . CARDIAC CATHETERIZATION  06/05/2013   native 3 vessel disease; patent LIMA to LAD, SVG to OM and SVG to PDA; Elevated right and left heart filling pressures, with predominantly pulmonary venous hypertension, normal PVR  . COLONOSCOPY  2007   Dr. Aviva Signs: internal hemorrhoids  . CORONARY ARTERY BYPASS GRAFT  01/26/2009   LIMA to LAD, SVG to circumflex, SVG to PDA  . GRAFT(S) ANGIOGRAM  06/05/2013   Procedure: GRAFT(S) Cyril Loosen;  Surgeon: Leonie Man, MD;  Location: [facility] CATH LAB;  Service: Cardiovascular;;  . LEFT AND RIGHT HEART CATHETERIZATION WITH CORONARY ANGIOGRAM N/A 06/05/2013   Procedure: LEFT AND RIGHT HEART CATHETERIZATION WITH CORONARY ANGIOGRAM;  Surgeon: Leonie Man, MD;  Location: [facility] CATH LAB;  Service: Cardiovascular;  Laterality: N/A;  . TRANSTHORACIC ECHOCARDIOGRAM  09/2011   grade 1 diastolic dysfunction; increasing valve gradient; calcified MV annulus   .  TRANSTHORACIC ECHOCARDIOGRAM  06/03/2013   EF 25-30%, mod conc. hypertrophy, grade 3 diastolic dysfunction; LA mod dilated; calcified MV annulus; transaortic gradients are normal for the bioprosthetic valve; inf vena cava dilated (elevated CVP) - LifeVest    Allergies: Review of patient's allergies indicates no known  allergies.  Medications: Prior to Admission medications   Medication Sig Start Date End Date Taking? Authorizing Provider  acetaminophen (TYLENOL) 500 MG tablet Take 1,000 mg by mouth 2 (two) times daily as needed for headache. For pain/headaches    Historical Provider, MD  ALPRAZolam Duanne Moron) 1 MG tablet Take 1 mg by mouth 3 (three) times daily.     Historical Provider, MD  amiodarone (PACERONE) 200 MG tablet TAKE ONE TABLET EACH DAY. 04/11/16   Pixie Casino, MD  atorvastatin (LIPITOR) 20 MG tablet Take 1 tablet (20 mg total) by mouth daily at 6 PM. 10/30/14   Pixie Casino, MD  celecoxib (CELEBREX) 200 MG capsule Take 200 mg by mouth 2 (two) times daily.    Historical Provider, MD  docusate sodium (COLACE) 100 MG capsule Take 100 mg by mouth 2 (two) times daily.    Historical Provider, MD  ELIQUIS 5 MG TABS tablet TAKE ONE TABLET TWICE DAILY Patient taking differently: Take 5 mg by mouth twice daily 11/09/15   Pixie Casino, MD  fenofibrate (TRICOR) 145 MG tablet TAKE ONE (1) TABLET BY MOUTH EVERY DAY 02/11/16   Pixie Casino, MD  ferrous sulfate 325 (65 FE) MG EC tablet Take 325 mg by mouth daily with breakfast.    Historical Provider, MD  gabapentin (NEURONTIN) 300 MG capsule Take 300 mg by mouth 3 (three) times daily.    Historical Provider, MD  insulin glargine (LANTUS) 100 UNIT/ML injection Inject 0.6-0.75 mLs (60-75 Units total) into the skin 2 (two) times daily. 75 units in the morning then 60 units at bedtime 02/25/16   Modena Jansky, MD  insulin lispro (HUMALOG) 100 UNIT/ML injection Inject 0.35-0.5 mLs (35-50 Units total) into the skin 3 (three) times daily before meals. Patient states that he takes 35 units at breakfast, 50 units at dinner and 50 units at supper. 02/25/16   Modena Jansky, MD  levothyroxine (SYNTHROID, LEVOTHROID) 25 MCG tablet Take 25 mcg by mouth daily before breakfast.    Historical Provider, MD  meclizine (ANTIVERT) 25 MG tablet Take 25 mg by mouth 3  (three) times daily as needed for dizziness.    Historical Provider, MD  metoprolol succinate (TOPROL-XL) 25 MG 24 hr tablet TAKE ONE (1) TABLET EACH DAY 10/02/15   Pixie Casino, MD  Omega-3 Fatty Acids (FISH OIL) 1200 MG CAPS Take by mouth.    Historical Provider, MD  pantoprazole (PROTONIX) 40 MG tablet Take 1 tablet (40 mg total) by mouth 2 (two) times daily. 10/30/14   Pixie Casino, MD  torsemide (DEMADEX) 10 MG tablet Take 1 tablet (10 mg total) by mouth daily. 06/29/15   Pixie Casino, MD     Family History  Problem Relation Age of Onset  . Heart attack Mother   . Liver disease Brother   . Diabetes Sister     x4    Social History   Social History  . Marital status: Divorced    Spouse name: N/A  . Number of children: 4  . Years of education: N/A   Occupational History  . 4 Retired   Social History Main Topics  . Smoking status: Former Smoker  Types: Cigarettes    Quit date: 08/15/2001  . Smokeless tobacco: Former Systems developer    Types: Chew     Comment: Quit in 2003  . Alcohol use No  . Drug use: No  . Sexual activity: Not Currently   Other Topics Concern  . Not on file   Social History Narrative  . No narrative on file    Review of Systems: A 12 point ROS discussed and pertinent positives are indicated in the HPI above.  All other systems are negative.  Review of Systems  Vital Signs: BP (!) 154/68 (BP Location: Left Arm, Patient Position: Sitting, Cuff Size: Large)   Pulse 72   Temp 97.6 F (36.4 C) (Oral)   Resp 16   Ht 5\' 10"  (1.778 m)   Wt 250 lb (113.4 kg)   SpO2 95%   BMI 35.87 kg/m   Physical Exam  Constitutional: He is oriented to person, place, and time. He appears well-developed and well-nourished. No distress.  HENT:  Head: Normocephalic and atraumatic.  Eyes: No scleral icterus.  Cardiovascular: Normal rate and regular rhythm.   Pulmonary/Chest: Effort normal.  Abdominal: Soft.  Feet:  Right Foot:  Skin Integrity: Positive for  ulcer.  Neurological: He is alert and oriented to person, place, and time.  Skin: Skin is warm and dry.  Psychiatric: He has a normal mood and affect.  Nursing note and vitals reviewed.   Mallampati Score:     Imaging: US Arterial Seg Multiple  Result Date: 05/12/2016 CLINICAL DATA:  History of critical limb ischemia. New wound right great toe. EXAM: NONINVASIVE PHYSIOLOGIC VASCULAR STUDY OF BILATERAL LOWER EXTREMITIES TECHNIQUE: Evaluation of both lower extremities was performed at rest, including calculation of ankle-brachial indices, multiple segmental pressure evaluation, segmental Doppler and segmental pulse volume recording. COMPARISON:  01/14/2016 FINDINGS: Right ABI:  0.66, previously 0.65 Left ABI:  0.61, previously 0.54 Right Lower Extremity: Monophasic waveforms throughout the right lower extremity. PVR waveforms demonstrate decreased amplitudes to the right thigh compared to the left thigh, similar to the previous examination. Left Lower Extremity: Monophasic Doppler waveforms throughout the left lower extremity. IMPRESSION: Stable right ABI, measuring 0.66. Slightly improved left ABI measuring 0.61. Electronically Signed   By: Markus Daft M.D.   On: 05/12/2016 16:19    Labs:  CBC:  Recent Labs  09/18/15 0656 10/07/15 0758 02/23/16 1956 02/23/16 2026  WBC 6.4 5.2 6.3  --   HGB 14.7 13.5 13.2 13.3  HCT 41.6 39.8 39.6 39.0  PLT 159 135* 134*  --     COAGS:  Recent Labs  09/18/15 0656 10/07/15 0758 02/23/16 1956  INR 1.09 1.02 1.20  APTT 31 29 33    BMP:  Recent Labs  09/18/15 0656 10/07/15 0758 02/23/16 1956 02/23/16 2026 02/25/16 0817  NA 139 138 137 141 135  K 4.7 4.4 4.2 4.2 4.3  CL 102 101 105 103 99*  CO2 26 23 24   --  26  GLUCOSE 349* 340* 310* 315* 255*  BUN 16 19 16 18 17   CALCIUM 9.4 9.2 8.8*  --  9.0  CREATININE 1.55* 1.43* 1.30* 1.20 1.27*  GFRNONAA 45* 49* 55*  --  57*  GFRAA 52* 57* >60  --  >60    LIVER FUNCTION TESTS:  Recent  Labs  06/29/15 1010 02/23/16 1956 04/14/16 1059  BILITOT 0.4 0.8 0.5  AST 32 52* 41*  ALT 50* 62 52*  ALKPHOS 50 43 39*  PROT 6.6 6.2* 6.6  ALBUMIN  4.2 4.0 4.3    TUMOR MARKERS: No results for input(s): AFPTM, CEA, CA199, CHROMGRNA in the last 8760 hours.  Assessment and Plan:  Mr. Buckalew continues to do well. The lateral wound on his right great toe has finally healed. However, he continues to have a small pea-sized ulcer at the distal tip of the right great toe. This is clean and well taken care of by wound care.  We discussed potential re-intervention to make one more attempt at recanalization of his chronically occluded anterior tibial artery. He is satisfied with his progress at this time and does not wish to undergo any further intervention. He took another copy of my card and will call if he decides to do anything further.  I issued him a prescription for 5% lidocaine patch to apply as needed to the medial malleolar region where he has persistent diabetic nerve pain.  Electronically Signed: Jacqulynn Cadet 05/12/2016, 4:31 PM   I spent a total of 15 Minutes in face to face in clinical consultation, greater than 50% of which was counseling/coordinating care for RLE critical limb ischemia.

## 2016-05-14 ENCOUNTER — Other Ambulatory Visit: Payer: Self-pay | Admitting: Internal Medicine

## 2016-05-16 ENCOUNTER — Ambulatory Visit (HOSPITAL_COMMUNITY): Payer: Medicare Other | Attending: Orthopaedic Surgery | Admitting: Physical Therapy

## 2016-05-16 DIAGNOSIS — R2681 Unsteadiness on feet: Secondary | ICD-10-CM | POA: Insufficient documentation

## 2016-05-16 DIAGNOSIS — S90931S Unspecified superficial injury of right great toe, sequela: Secondary | ICD-10-CM

## 2016-05-16 DIAGNOSIS — L97513 Non-pressure chronic ulcer of other part of right foot with necrosis of muscle: Secondary | ICD-10-CM | POA: Insufficient documentation

## 2016-05-16 DIAGNOSIS — R262 Difficulty in walking, not elsewhere classified: Secondary | ICD-10-CM | POA: Insufficient documentation

## 2016-05-16 NOTE — Therapy (Signed)
[place], [name], [address] Phone: [phone]   Fax:  [phone]  Wound Care Therapy  Patient Details  Name: [name] MRN: [id number] Date of Birth: [birth year] No Data Recorded  Encounter Date: 05/16/2016      PT End of Session - 05/16/16 1203    Visit Number 2   Number of Visits 16   Date for PT Re-Evaluation 06/13/16   Authorization Type UHC Medicare (need to charge KX moving forward)   Authorization Time Period 05/09/16 to 07/09/16   Authorization - Visit Number 2   Authorization - Number of Visits 10   PT Start Time 1119   PT Stop Time 1145   PT Time Calculation (min) 26 min   Activity Tolerance Patient tolerated treatment well   Behavior During Therapy [facility] for tasks assessed/performed      Past Medical History:  Diagnosis Date  . At risk for sudden cardiac death 2013/07/02  . Cataract   . Chronic anticoagulation 05/2013   started  . Chronic kidney disease    Patient reports he is seeing Kidney doctor in September  . Coronary artery disease   . Diabetes mellitus    type 2   . Dyslipidemia   . GERD (gastroesophageal reflux disease)   . History of valve replacement 2010   [facility] Ease bioprosthetic 60mm  . Hypertension   . PAF (paroxysmal atrial fibrillation) ([facility]) 05/2013   converted with amiodarone to SR  . S/P CABG x 3 2010  . S/P cardiac catheterization, Rt & Lt heart cath 06/05/2013 07/02/2013  . Stroke [facility]) 2003   R-sided weakness & upper extremity swelling   . Thyroid disease     Past Surgical History:  Procedure Laterality Date  . AORTIC VALVE REPLACEMENT  01/26/2009   [facility] Ease bioprosthetic 69mm valve  . CARDIAC CATHETERIZATION  06/05/2013   native 3 vessel disease; patent LIMA to LAD, SVG to OM and SVG to PDA; Elevated right and left heart filling pressures, with predominantly pulmonary venous hypertension, normal PVR  . COLONOSCOPY  2007   Dr. Aviva Signs: internal hemorrhoids  . CORONARY ARTERY BYPASS GRAFT  01/26/2009   LIMA to LAD, SVG to circumflex, SVG to PDA  . GRAFT(S) ANGIOGRAM  06/05/2013   Procedure: GRAFT(S) Cyril Loosen;  Surgeon: Leonie Man, MD;  Location: [facility] CATH LAB;  Service: Cardiovascular;;  . LEFT AND RIGHT HEART CATHETERIZATION WITH CORONARY ANGIOGRAM N/A 06/05/2013   Procedure: LEFT AND RIGHT HEART CATHETERIZATION WITH CORONARY ANGIOGRAM;  Surgeon: Leonie Man, MD;  Location: [facility] CATH LAB;  Service: Cardiovascular;  Laterality: N/A;  . TRANSTHORACIC ECHOCARDIOGRAM  09/2011   grade 1 diastolic dysfunction; increasing valve gradient; calcified MV annulus   . TRANSTHORACIC ECHOCARDIOGRAM  06/03/2013   EF 25-30%, mod conc. hypertrophy, grade 3 diastolic dysfunction; LA mod dilated; calcified MV annulus; transaortic gradients are normal for the bioprosthetic valve; inf vena cava dilated (elevated CVP) - LifeVest    There were no vitals filed for this visit.                  Wound Therapy - 05/16/16 1157    Subjective Patient arrives today in good spirits, stating that his vascular doctor's office took his dressing off but repalced it at his appointment    Patient and Family Stated Goals wound to heal    Date of Onset --  unsure    Prior Treatments has received wound  PT at this clinic multiple times    Pain Assessment No/denies pain   Evaluation and Treatment Procedures Explained to Patient/Family Yes   Evaluation and Treatment Procedures agreed to   Wound Properties Date First Assessed: 05/09/16 Time First Assessed: 1206 Wound Type: Other (Comment) , R great toe wound   Location: Other (Comment) , R great toe   Location Orientation: Right Wound Description (Comments): R great toe  Present on Admission: Yes   Dressing Type Impregnated gauze (petrolatum);Gauze (Comment)   Dressing Changed New   Dressing Status None   Dressing Change Frequency PRN   % Wound base Red or Granulating 20%   % Wound base  Yellow 80%   Undermining (cm) underming approx 0.1cm 9-12, 0.05cm from 12-4   Margins Unattached edges (unapproximated)   Closure None   Drainage Amount None   Drainage Description No odor   Treatment Cleansed;Debridement (Selective);Other (Comment)  medihoney, gauze, tape    Wound Properties Date First Assessed: 02/23/16 Wound Type: Other (Comment) , open area R great toe   Location: Other (Comment) , Medial R great toe   Location Orientation: Right;Medial Present on Admission: Yes   Incision Properties Date First Assessed: 04/23/15 Time First Assessed: 1738 Location: Groin Location Orientation: Left Present on Admission: No   Selective Debridement - Location slough in wound    Selective Debridement - Tools Used Forceps;Scalpel   Selective Debridement - Tissue Removed slough    Wound Therapy - Clinical Statement Patient arrived today stating that his vascular MD took his dressing off and replaced it; upon examination, noted that patient's wound was covered with xeroform but gauze was not covering xeroform, only the rest of the toe. Proceeded to clean toe and debride slough, noting additional undermining from 9-12 of approximately 0.1cm. Slough remains somewhat adherent but able to remove approximately 50% today. Encouraged patient to make more apppointments if he neds up not going out of town.    Wound Therapy - Functional Problem List dificulty walking, dificulty wearing shoes    Factors Delaying/Impairing Wound Healing Altered sensation;Diabetes Mellitus;Infection - systemic/local;Immobility;Multiple medical problems;Vascular compromise   Hydrotherapy Plan Debridement;Dressing change;Patient/family education   Wound Therapy - Frequency Other (comment)  2x/week    Wound Therapy - Current Recommendations PT   Wound Plan medihoney, debridement, tape                  PT Education - 05/16/16 1202    Education provided Yes   Education Details importance of being adherent with diabetic  shoes, maintaining blood sugars in normal range, make more appts if he does not end up going out of town    [employer]) Educated Patient   Methods Explanation   Comprehension Verbalized understanding          PT Short Term Goals - 05/09/16 1223      PT SHORT TERM GOAL #1   Title Patient to demonstrate reduction of wound area by at least 50% on all measured dimensions in order to demonstrate improvement of condition    Time 4   Period Weeks   Status New     PT SHORT TERM GOAL #2   Title Patient will verbally be able to verbally state at least 3 methods of foot care/ways to prevent further wound formation in order to prevent wound recurrence    Time 4   Period Weeks   Status New           PT Long Term Goals - 05/09/16 1224  PT LONG TERM GOAL #1   Title Patient to be able to wear all pairs of shoes without exacerbation of R great toe in order to improve QOL and recurrence of wound    Time 8   Period Weeks   Status New     PT LONG TERM GOAL #2   Title Patient to display full wound closure in order to demonstrate resolution of condition    Time 8   Period Weeks   Status New     PT LONG TERM GOAL #3   Title Patient to be able to verbally state the importance of maintaining blood sugars in relation to proper wound healing and general health status    Time 8   Period Weeks   Status New             Patient will benefit from skilled therapeutic intervention in order to improve the following deficits and impairments:     Visit Diagnosis: Unspecified superficial injury of right great toe, sequela  Difficulty in walking, not elsewhere classified     Problem List Patient Active Problem List   Diagnosis Date Noted  . Nausea & vomiting 02/24/2016  . A-fib ([place]) 02/23/2016  . Hypothyroidism 02/23/2016  . CKD (chronic kidney disease), stage III 02/23/2016  . Anxiety 02/23/2016  . Chronic combined systolic (congestive) and diastolic (congestive) heart failure  02/23/2016  . Dizziness 02/23/2016  . Open wound of right foot 02/23/2016  . Rectal bleeding 07/28/2015  . Chronic anticoagulation 07/28/2015  . PVD (peripheral vascular disease) ([place])   . S/P angioplasty 04/23/2015  . Peripheral arterial occlusive disease ([place]) 04/23/2015  . PAD (peripheral artery disease) ([place])   . Critical lower limb ischemia   . Toe ulcer, right ([place])   . Type II or unspecified type diabetes mellitus without mention of complication, uncontrolled 11/27/2013  . Acute respiratory failure with hypoxia ([place]) 11/26/2013  . CAP (community acquired pneumonia) 11/26/2013  . CHF exacerbation ([place]) 11/25/2013  . Cardiomyopathy, ischemic 06/13/2013  . At risk for sudden cardiac death Jun 24, 2013  . S/P cardiac catheterization, Rt & Lt heart cath 06/05/2013 06-24-13  . Acute on chronic combined systolic and diastolic HF (heart failure), NYHA class 3 ([place]) 06/04/2013  . Atrial flutter with rapid ventricular response ([place]) 06/02/2013  . Acute renal failure: Cr up to 1.8 06/02/2013  . Hyperkalemia 06/02/2013    Class: Acute  . LBBB (left bundle branch block) 06/01/2013  . Hypotension- secondary to AF and Diltiazem Rx 06/01/2013  . Obesity (BMI 30-39.9) 06/01/2013  . Acute on chronic diastolic heart failure - due to Afib AVR 06/01/2013  . Atrial fibrillation with RVR- converted with Amiodarone 05/31/2013  . Stroke- Lt brain 2003 04/08/2013  . Hemiplegia affecting right dominant side ([place]) 04/08/2013  . Cataract 04/08/2013  . GERD (gastroesophageal reflux disease) 04/08/2013  . S/P CABG x 3 - 2010 04/08/2013  . S/P AVR-tissue, 2010 04/08/2013  . DM2 (diabetes mellitus, type 2) ([place]) 04/08/2013  . Dyslipidemia 04/08/2013  . SUBACROMIAL BURSITIS, LEFT 11/05/2009    Deniece Ree PT, DPT [place] [address] [place], [name], [address] Phone: [phone]   Fax:  [phone]  Name: [name] MRN:  [id number] Date of Birth: 1946-06-11

## 2016-05-19 ENCOUNTER — Ambulatory Visit (HOSPITAL_COMMUNITY): Payer: Medicare Other | Admitting: Physical Therapy

## 2016-05-19 DIAGNOSIS — L97513 Non-pressure chronic ulcer of other part of right foot with necrosis of muscle: Secondary | ICD-10-CM | POA: Diagnosis not present

## 2016-05-19 DIAGNOSIS — S90931S Unspecified superficial injury of right great toe, sequela: Secondary | ICD-10-CM

## 2016-05-19 DIAGNOSIS — R262 Difficulty in walking, not elsewhere classified: Secondary | ICD-10-CM | POA: Diagnosis not present

## 2016-05-19 DIAGNOSIS — R2681 Unsteadiness on feet: Secondary | ICD-10-CM | POA: Diagnosis not present

## 2016-05-19 NOTE — Therapy (Signed)
[place], [name], [address] Phone: [phone]   Fax:  [phone]  Wound Care Therapy  Patient Details  Name: [name] MRN: [id number] Date of Birth: 05/25/1946 No Data Recorded  Encounter Date: 05/19/2016      PT End of Session - 05/19/16 1038    Visit Number 3   Number of Visits 16   Date for PT Re-Evaluation 06/13/16   Authorization Type UHC Medicare (need to charge KX moving forward)   Authorization Time Period 05/09/16 to 07/09/16   Authorization - Visit Number 3   Authorization - Number of Visits 10   PT Start Time 0820   PT Stop Time 0845   PT Time Calculation (min) 25 min   Activity Tolerance Patient tolerated treatment well   Behavior During Therapy [facility] for tasks assessed/performed      Past Medical History:  Diagnosis Date  . At risk for sudden cardiac death June 28, 2013  . Cataract   . Chronic anticoagulation 05/2013   started  . Chronic kidney disease    Patient reports he is seeing Kidney doctor in September  . Coronary artery disease   . Diabetes mellitus    type 2   . Dyslipidemia   . GERD (gastroesophageal reflux disease)   . History of valve replacement 2010   [facility] Ease bioprosthetic 52mm  . Hypertension   . PAF (paroxysmal atrial fibrillation) ([place]) 05/2013   converted with amiodarone to SR  . S/P CABG x 3 2010  . S/P cardiac catheterization, Rt & Lt heart cath 06/05/2013 06/28/2013  . Stroke [facility]) 2003   R-sided weakness & upper extremity swelling   . Thyroid disease     Past Surgical History:  Procedure Laterality Date  . AORTIC VALVE REPLACEMENT  01/26/2009   [facility] Ease bioprosthetic 77mm valve  . CARDIAC CATHETERIZATION  06/05/2013   native 3 vessel disease; patent LIMA to LAD, SVG to OM and SVG to PDA; Elevated right and left heart filling pressures, with predominantly pulmonary venous hypertension, normal PVR  . COLONOSCOPY  2007   Dr. Aviva Signs: internal hemorrhoids  . CORONARY ARTERY BYPASS GRAFT  01/26/2009   LIMA to LAD, SVG to circumflex, SVG to PDA  . GRAFT(S) ANGIOGRAM  06/05/2013   Procedure: GRAFT(S) Cyril Loosen;  Surgeon: Leonie Man, MD;  Location: [facility] CATH LAB;  Service: Cardiovascular;;  . LEFT AND RIGHT HEART CATHETERIZATION WITH CORONARY ANGIOGRAM N/A 06/05/2013   Procedure: LEFT AND RIGHT HEART CATHETERIZATION WITH CORONARY ANGIOGRAM;  Surgeon: Leonie Man, MD;  Location: [facility] CATH LAB;  Service: Cardiovascular;  Laterality: N/A;  . TRANSTHORACIC ECHOCARDIOGRAM  09/2011   grade 1 diastolic dysfunction; increasing valve gradient; calcified MV annulus   . TRANSTHORACIC ECHOCARDIOGRAM  06/03/2013   EF 25-30%, mod conc. hypertrophy, grade 3 diastolic dysfunction; LA mod dilated; calcified MV annulus; transaortic gradients are normal for the bioprosthetic valve; inf vena cava dilated (elevated CVP) - LifeVest    There were no vitals filed for this visit.                  Wound Therapy - 05/19/16 1032    Subjective Pt without complaints   Patient and Family Stated Goals wound to heal    Date of Onset --  unsure    Prior Treatments has received wound PT at this clinic multiple times    Pain Assessment No/denies pain   Wound Properties Date First Assessed:  05/09/16 Time First Assessed: 1206 Wound Type: Other (Comment) , R great toe wound   Location: Other (Comment) , R great toe   Location Orientation: Right Wound Description (Comments): R great toe  Present on Admission: Yes   Dressing Type Gauze (Comment)  medihoney gauze   Dressing Changed Changed   Dressing Status None   Dressing Change Frequency PRN   % Wound base Red or Granulating 60%   % Wound base Yellow 40%   Margins Unattached edges (unapproximated)   Closure None   Drainage Amount Scant   Drainage Description Serosanguineous   Treatment Cleansed;Debridement (Selective)   Wound Properties Date First Assessed: 02/23/16 Wound Type:  Other (Comment) , open area R great toe   Location: Other (Comment) , Medial R great toe   Location Orientation: Right;Medial Present on Admission: Yes   Incision Properties Date First Assessed: 04/23/15 Time First Assessed: 1738 Location: Groin Location Orientation: Left Present on Admission: No   Selective Debridement - Location slough in wound, edges and callous beside wound   Selective Debridement - Tools Used Forceps;Scalpel   Selective Debridement - Tissue Removed slough, dead skin and callous   Wound Therapy - Clinical Statement Pt overall doing well with bandage intact.  Noted improvement in granulation but with calloused area superior to wound.  Spent time debriding the edges of wound and callous from superior location.  Cotninued with medihoney gauze, however may be ready for xeroform next session.  2X2 and medipore tape used to secure followed by #1 netting.   Wound Therapy - Functional Problem List dificulty walking, dificulty wearing shoes    Factors Delaying/Impairing Wound Healing Altered sensation;Diabetes Mellitus;Infection - systemic/local;Immobility;Multiple medical problems;Vascular compromise   Hydrotherapy Plan Debridement;Dressing change;Patient/family education   Wound Therapy - Frequency Other (comment)  2x/week    Wound Therapy - Current Recommendations PT   Wound Plan Appropriate dressing for healing wound environment, , debridement, tape    Dressing  medihoney gel, 2X2 cut into pieces, medipore tape and #1 netting.                   PT Short Term Goals - 05/09/16 1223      PT SHORT TERM GOAL #1   Title Patient to demonstrate reduction of wound area by at least 50% on all measured dimensions in order to demonstrate improvement of condition    Time 4   Period Weeks   Status New     PT SHORT TERM GOAL #2   Title Patient will verbally be able to verbally state at least 3 methods of foot care/ways to prevent further wound formation in order to prevent wound  recurrence    Time 4   Period Weeks   Status New           PT Long Term Goals - 05/09/16 1224      PT LONG TERM GOAL #1   Title Patient to be able to wear all pairs of shoes without exacerbation of R great toe in order to improve QOL and recurrence of wound    Time 8   Period Weeks   Status New     PT LONG TERM GOAL #2   Title Patient to display full wound closure in order to demonstrate resolution of condition    Time 8   Period Weeks   Status New     PT LONG TERM GOAL #3   Title Patient to be able to verbally state the importance of maintaining blood  sugars in relation to proper wound healing and general health status    Time 8   Period Weeks   Status New             Patient will benefit from skilled therapeutic intervention in order to improve the following deficits and impairments:     Visit Diagnosis: Unspecified superficial injury of right great toe, sequela  Difficulty in walking, not elsewhere classified  Unsteadiness on feet  Non-pressure chronic ulcer of other part of right foot with necrosis of muscle ([place])     Problem List Patient Active Problem List   Diagnosis Date Noted  . Nausea & vomiting 02/24/2016  . A-fib ([place]) 02/23/2016  . Hypothyroidism 02/23/2016  . CKD (chronic kidney disease), stage III 02/23/2016  . Anxiety 02/23/2016  . Chronic combined systolic (congestive) and diastolic (congestive) heart failure 02/23/2016  . Dizziness 02/23/2016  . Open wound of right foot 02/23/2016  . Rectal bleeding 07/28/2015  . Chronic anticoagulation 07/28/2015  . PVD (peripheral vascular disease) ([place])   . S/P angioplasty 04/23/2015  . Peripheral arterial occlusive disease ([place]) 04/23/2015  . PAD (peripheral artery disease) ([place])   . Critical lower limb ischemia   . Toe ulcer, right ([place])   . Type II or unspecified type diabetes mellitus without mention of complication, uncontrolled 11/27/2013  . Acute respiratory failure with hypoxia  ([place]) 11/26/2013  . CAP (community acquired pneumonia) 11/26/2013  . CHF exacerbation ([place]) 11/25/2013  . Cardiomyopathy, ischemic 06/13/2013  . At risk for sudden cardiac death 06-15-2013  . S/P cardiac catheterization, Rt & Lt heart cath 06/05/2013 06/15/13  . Acute on chronic combined systolic and diastolic HF (heart failure), NYHA class 3 ([place]) 06/04/2013  . Atrial flutter with rapid ventricular response ([place]) 06/02/2013  . Acute renal failure: Cr up to 1.8 06/02/2013  . Hyperkalemia 06/02/2013    Class: Acute  . LBBB (left bundle branch block) 06/01/2013  . Hypotension- secondary to AF and Diltiazem Rx 06/01/2013  . Obesity (BMI 30-39.9) 06/01/2013  . Acute on chronic diastolic heart failure - due to Afib AVR 06/01/2013  . Atrial fibrillation with RVR- converted with Amiodarone 05/31/2013  . Stroke- Lt brain 2003 04/08/2013  . Hemiplegia affecting right dominant side ([place]) 04/08/2013  . Cataract 04/08/2013  . GERD (gastroesophageal reflux disease) 04/08/2013  . S/P CABG x 3 - 2010 04/08/2013  . S/P AVR-tissue, 2010 04/08/2013  . DM2 (diabetes mellitus, type 2) ([place]) 04/08/2013  . Dyslipidemia 04/08/2013  . SUBACROMIAL BURSITIS, LEFT 11/05/2009    Teena Irani, PTA/CLT [phone]  05/19/2016, 10:39 AM  [place] [address] [place], [name], [address] Phone: [phone]   Fax:  [phone]  Name: [name] MRN: [id number] Date of Birth: Oct 02, 1945

## 2016-05-24 ENCOUNTER — Ambulatory Visit (HOSPITAL_COMMUNITY): Payer: Medicare Other | Admitting: Physical Therapy

## 2016-05-24 DIAGNOSIS — R262 Difficulty in walking, not elsewhere classified: Secondary | ICD-10-CM | POA: Diagnosis not present

## 2016-05-24 DIAGNOSIS — L97513 Non-pressure chronic ulcer of other part of right foot with necrosis of muscle: Secondary | ICD-10-CM | POA: Diagnosis not present

## 2016-05-24 DIAGNOSIS — S90931S Unspecified superficial injury of right great toe, sequela: Secondary | ICD-10-CM

## 2016-05-24 DIAGNOSIS — R2681 Unsteadiness on feet: Secondary | ICD-10-CM | POA: Diagnosis not present

## 2016-05-24 NOTE — Therapy (Signed)
[place], [name], [address] Phone: [phone]   Fax:  [phone]  Wound Care Therapy  Patient Details  Name: [name] MRN: [id number] Date of Birth: 10/20/1945 No Data Recorded  Encounter Date: 05/24/2016      PT End of Session - 05/24/16 [id number]    Visit Number 4   Number of Visits 16   Date for PT Re-Evaluation 06/13/16   Authorization Type UHC Medicare (need to charge KX moving forward)   Authorization Time Period 05/09/16 to 07/09/16   Authorization - Visit Number 4   Authorization - Number of Visits 10   PT Start Time 0902   PT Stop Time 0930   PT Time Calculation (min) 28 min   Activity Tolerance Patient tolerated treatment well   Behavior During Therapy [facility] for tasks assessed/performed      Past Medical History:  Diagnosis Date  . At risk for sudden cardiac death 07-Jul-2013  . Cataract   . Chronic anticoagulation 05/2013   started  . Chronic kidney disease    Patient reports he is seeing Kidney doctor in September  . Coronary artery disease   . Diabetes mellitus    type 2   . Dyslipidemia   . GERD (gastroesophageal reflux disease)   . History of valve replacement 2010   [facility] Ease bioprosthetic 80mm  . Hypertension   . PAF (paroxysmal atrial fibrillation) ([place]) 05/2013   converted with amiodarone to SR  . S/P CABG x 3 2010  . S/P cardiac catheterization, Rt & Lt heart cath 06/05/2013 07-07-13  . Stroke [facility]) 2003   R-sided weakness & upper extremity swelling   . Thyroid disease     Past Surgical History:  Procedure Laterality Date  . AORTIC VALVE REPLACEMENT  01/26/2009   [facility] Ease bioprosthetic 66mm valve  . CARDIAC CATHETERIZATION  06/05/2013   native 3 vessel disease; patent LIMA to LAD, SVG to OM and SVG to PDA; Elevated right and left heart filling pressures, with predominantly pulmonary venous hypertension, normal PVR  . COLONOSCOPY  2007   Dr. Aviva Signs: internal hemorrhoids  . CORONARY ARTERY BYPASS GRAFT  01/26/2009   LIMA to LAD, SVG to circumflex, SVG to PDA  . GRAFT(S) ANGIOGRAM  06/05/2013   Procedure: GRAFT(S) Cyril Loosen;  Surgeon: Leonie Man, MD;  Location: [facility] CATH LAB;  Service: Cardiovascular;;  . LEFT AND RIGHT HEART CATHETERIZATION WITH CORONARY ANGIOGRAM N/A 06/05/2013   Procedure: LEFT AND RIGHT HEART CATHETERIZATION WITH CORONARY ANGIOGRAM;  Surgeon: Leonie Man, MD;  Location: [facility] CATH LAB;  Service: Cardiovascular;  Laterality: N/A;  . TRANSTHORACIC ECHOCARDIOGRAM  09/2011   grade 1 diastolic dysfunction; increasing valve gradient; calcified MV annulus   . TRANSTHORACIC ECHOCARDIOGRAM  06/03/2013   EF 25-30%, mod conc. hypertrophy, grade 3 diastolic dysfunction; LA mod dilated; calcified MV annulus; transaortic gradients are normal for the bioprosthetic valve; inf vena cava dilated (elevated CVP) - LifeVest    There were no vitals filed for this visit.                  Wound Therapy - 05/24/16 0934    Subjective Patient arrives today feeling well with no major complaints    Patient and Family Stated Goals wound to heal    Date of Onset 02/18/13   Prior Treatments has received wound PT at this clinic multiple times    Pain Assessment No/denies pain   Evaluation  and Treatment Procedures Explained to Patient/Family Yes   Evaluation and Treatment Procedures agreed to   Wound Properties Date First Assessed: 05/09/16 Time First Assessed: 1206 Wound Type: Other (Comment) , R great toe wound   Location: Other (Comment) , R great toe   Location Orientation: Right Wound Description (Comments): R great toe  Present on Admission: Yes   Dressing Type Gauze (Comment)   Dressing Changed Changed   Dressing Status None   Dressing Change Frequency PRN   % Wound base Red or Granulating 60%   % Wound base Yellow 40%   Margins Unattached edges (unapproximated)   Closure None   Drainage Amount Scant   Drainage  Description Serosanguineous   Treatment Cleansed;Debridement (Selective);Packing (Impregnated strip);Tape changed   Wound Properties Date First Assessed: 02/23/16 Wound Type: Other (Comment) , open area R great toe   Location: Other (Comment) , Medial R great toe   Location Orientation: Right;Medial Present on Admission: Yes   Incision Properties Date First Assessed: 04/23/15 Time First Assessed: 1738 Location: Groin Location Orientation: Left Present on Admission: No   Selective Debridement - Location slough in wound, edges and callous beside wound   Selective Debridement - Tools Used Forceps;Scalpel   Selective Debridement - Tissue Removed slough, dead skin and callous   Wound Therapy - Clinical Statement Patient continues to show improvement in his general wound appearance today; debrided callous around wound as well as some slough in wound bed, although he does continue to show some adherent slough in lower left hand corner of wound. Noted no significant blood flow with debridement today however. Surrounded wound/callouses with vasoline and used xeroform on wound bed. Dressed wound with gauze, tape and netting.    Wound Therapy - Functional Problem List dificulty walking, dificulty wearing shoes    Factors Delaying/Impairing Wound Healing Altered sensation;Diabetes Mellitus;Infection - systemic/local;Immobility;Multiple medical problems;Vascular compromise   Hydrotherapy Plan Debridement;Dressing change;Patient/family education   Wound Therapy - Frequency Other (comment)  2x/week    Wound Therapy - Current Recommendations PT   Wound Plan Appropriate dressing for healing wound environment, , debridement, tape    Dressing  xeroform, 2x2, gauze, tape                  PT Education - 05/24/16 0937    Education provided Yes   Education Details importance of maintaining blood sugars in appropriate range, effect of high blood sugars on healing    Person(s) Educated Patient   Methods  Explanation   Comprehension Verbalized understanding          PT Short Term Goals - 05/09/16 1223      PT SHORT TERM GOAL #1   Title Patient to demonstrate reduction of wound area by at least 50% on all measured dimensions in order to demonstrate improvement of condition    Time 4   Period Weeks   Status New     PT SHORT TERM GOAL #2   Title Patient will verbally be able to verbally state at least 3 methods of foot care/ways to prevent further wound formation in order to prevent wound recurrence    Time 4   Period Weeks   Status New           PT Long Term Goals - 05/09/16 1224      PT LONG TERM GOAL #1   Title Patient to be able to wear all pairs of shoes without exacerbation of R great toe in order to improve QOL and recurrence  of wound    Time 8   Period Weeks   Status New     PT LONG TERM GOAL #2   Title Patient to display full wound closure in order to demonstrate resolution of condition    Time 8   Period Weeks   Status New     PT LONG TERM GOAL #3   Title Patient to be able to verbally state the importance of maintaining blood sugars in relation to proper wound healing and general health status    Time 8   Period Weeks   Status New             Patient will benefit from skilled therapeutic intervention in order to improve the following deficits and impairments:     Visit Diagnosis: Unspecified superficial injury of right great toe, sequela  Difficulty in walking, not elsewhere classified     Problem List Patient Active Problem List   Diagnosis Date Noted  . Nausea & vomiting 02/24/2016  . A-fib ([place]) 02/23/2016  . Hypothyroidism 02/23/2016  . CKD (chronic kidney disease), stage III 02/23/2016  . Anxiety 02/23/2016  . Chronic combined systolic (congestive) and diastolic (congestive) heart failure 02/23/2016  . Dizziness 02/23/2016  . Open wound of right foot 02/23/2016  . Rectal bleeding 07/28/2015  . Chronic anticoagulation 07/28/2015   . PVD (peripheral vascular disease) ([place])   . S/P angioplasty 04/23/2015  . Peripheral arterial occlusive disease ([place]) 04/23/2015  . PAD (peripheral artery disease) ([place])   . Critical lower limb ischemia   . Toe ulcer, right ([place])   . Type II or unspecified type diabetes mellitus without mention of complication, uncontrolled 11/27/2013  . Acute respiratory failure with hypoxia ([place]) 11/26/2013  . CAP (community acquired pneumonia) 11/26/2013  . CHF exacerbation ([place]) 11/25/2013  . Cardiomyopathy, ischemic 06/13/2013  . At risk for sudden cardiac death 07/05/2013  . S/P cardiac catheterization, Rt & Lt heart cath 06/05/2013 07/05/2013  . Acute on chronic combined systolic and diastolic HF (heart failure), NYHA class 3 ([place]) 06/04/2013  . Atrial flutter with rapid ventricular response ([place]) 06/02/2013  . Acute renal failure: Cr up to 1.8 06/02/2013  . Hyperkalemia 06/02/2013    Class: Acute  . LBBB (left bundle branch block) 06/01/2013  . Hypotension- secondary to AF and Diltiazem Rx 06/01/2013  . Obesity (BMI 30-39.9) 06/01/2013  . Acute on chronic diastolic heart failure - due to Afib AVR 06/01/2013  . Atrial fibrillation with RVR- converted with Amiodarone 05/31/2013  . Stroke- Lt brain 2003 04/08/2013  . Hemiplegia affecting right dominant side ([place]) 04/08/2013  . Cataract 04/08/2013  . GERD (gastroesophageal reflux disease) 04/08/2013  . S/P CABG x 3 - 2010 04/08/2013  . S/P AVR-tissue, 2010 04/08/2013  . DM2 (diabetes mellitus, type 2) ([place]) 04/08/2013  . Dyslipidemia 04/08/2013  . SUBACROMIAL BURSITIS, LEFT 11/05/2009    Deniece Ree PT, DPT [place] [address] [place], [name], [address] Phone: [phone]   Fax:  [phone]  Name: [name] MRN: [id number] Date of Birth: 1945-09-23

## 2016-05-26 ENCOUNTER — Ambulatory Visit (HOSPITAL_COMMUNITY): Payer: Medicare Other

## 2016-05-26 DIAGNOSIS — R2681 Unsteadiness on feet: Secondary | ICD-10-CM

## 2016-05-26 DIAGNOSIS — L97513 Non-pressure chronic ulcer of other part of right foot with necrosis of muscle: Secondary | ICD-10-CM | POA: Diagnosis not present

## 2016-05-26 DIAGNOSIS — R262 Difficulty in walking, not elsewhere classified: Secondary | ICD-10-CM | POA: Diagnosis not present

## 2016-05-26 DIAGNOSIS — S90931S Unspecified superficial injury of right great toe, sequela: Secondary | ICD-10-CM | POA: Diagnosis not present

## 2016-05-26 NOTE — Therapy (Signed)
[place], [name], [address] Phone: [phone]   Fax:  [phone]  Wound Care Therapy  Patient Details  Name: [name] MRN: [id number] Date of Birth: 1946-06-17 No Data Recorded  Encounter Date: 05/26/2016      PT End of Session - 05/26/16 0844    Visit Number 5   Number of Visits 16   Date for PT Re-Evaluation 06/13/16   Authorization Type UHC Medicare (need to charge KX moving forward)   Authorization Time Period 05/09/16 to 07/09/16   Authorization - Visit Number 5   Authorization - Number of Visits 10   PT Start Time 0816   PT Stop Time 0840   PT Time Calculation (min) 24 min      Past Medical History:  Diagnosis Date  . At risk for sudden cardiac death 2013-06-22  . Cataract   . Chronic anticoagulation 05/2013   started  . Chronic kidney disease    Patient reports he is seeing Kidney doctor in September  . Coronary artery disease   . Diabetes mellitus    type 2   . Dyslipidemia   . GERD (gastroesophageal reflux disease)   . History of valve replacement 2010   [facility] Ease bioprosthetic 32mm  . Hypertension   . PAF (paroxysmal atrial fibrillation) ([place]) 05/2013   converted with amiodarone to SR  . S/P CABG x 3 2010  . S/P cardiac catheterization, Rt & Lt heart cath 06/05/2013 June 22, 2013  . Stroke [facility]) 2003   R-sided weakness & upper extremity swelling   . Thyroid disease     Past Surgical History:  Procedure Laterality Date  . AORTIC VALVE REPLACEMENT  01/26/2009   [facility] Ease bioprosthetic 59mm valve  . CARDIAC CATHETERIZATION  06/05/2013   native 3 vessel disease; patent LIMA to LAD, SVG to OM and SVG to PDA; Elevated right and left heart filling pressures, with predominantly pulmonary venous hypertension, normal PVR  . COLONOSCOPY  2007   Dr. Aviva Signs: internal hemorrhoids  . CORONARY ARTERY BYPASS GRAFT  01/26/2009   LIMA to LAD, SVG to circumflex, SVG  to PDA  . GRAFT(S) ANGIOGRAM  06/05/2013   Procedure: GRAFT(S) Cyril Loosen;  Surgeon: Leonie Man, MD;  Location: [facility] CATH LAB;  Service: Cardiovascular;;  . LEFT AND RIGHT HEART CATHETERIZATION WITH CORONARY ANGIOGRAM N/A 06/05/2013   Procedure: LEFT AND RIGHT HEART CATHETERIZATION WITH CORONARY ANGIOGRAM;  Surgeon: Leonie Man, MD;  Location: [facility] CATH LAB;  Service: Cardiovascular;  Laterality: N/A;  . TRANSTHORACIC ECHOCARDIOGRAM  09/2011   grade 1 diastolic dysfunction; increasing valve gradient; calcified MV annulus   . TRANSTHORACIC ECHOCARDIOGRAM  06/03/2013   EF 25-30%, mod conc. hypertrophy, grade 3 diastolic dysfunction; LA mod dilated; calcified MV annulus; transaortic gradients are normal for the bioprosthetic valve; inf vena cava dilated (elevated CVP) - LifeVest    There were no vitals filed for this visit.       Subjective Assessment - 05/26/16 0842    Subjective Pain free, dressings intact   Pertinent History Patient reports that this wound started as a small little brown spot on the end of his toe that just kept getting bigger; he thinks it started about 8 months ago. He reports that it is now slowly getting better but has been healing slow. He states  that it has been getting better.    Currently in Pain? No/denies  Wound Therapy - 05/26/16 0845    Subjective Pain free, dressings intact   Patient and Family Stated Goals wound to heal    Date of Onset 02/18/13   Prior Treatments has received wound PT at this clinic multiple times    Pain Assessment No/denies pain   Evaluation and Treatment Procedures Explained to Patient/Family Yes   Evaluation and Treatment Procedures agreed to   Wound Properties Date First Assessed: 05/09/16 Time First Assessed: 1206 Wound Type: Other (Comment) , R great toe wound   Location: Other (Comment) , R great toe   Location Orientation: Right Wound Description (Comments): R great toe  Present on Admission: Yes    Dressing Type Impregnated gauze (bismuth);Gauze (Comment);Other (Comment)  xeroform, 2x2, medipore tape   Dressing Changed Changed   Dressing Status None   Dressing Change Frequency PRN   % Wound base Red or Granulating 75%   % Wound base Yellow 25%   Margins Unattached edges (unapproximated)   Closure None   Drainage Amount Scant   Drainage Description Serosanguineous   Treatment Cleansed;Debridement (Selective)   Wound Properties Date First Assessed: 02/23/16 Wound Type: Other (Comment) , open area R great toe   Location: Other (Comment) , Medial R great toe   Location Orientation: Right;Medial Present on Admission: Yes   Incision Properties Date First Assessed: 04/23/15 Time First Assessed: 1738 Location: Groin Location Orientation: Left Present on Admission: No   Selective Debridement - Location slough in wound, edges and callous beside wound   Selective Debridement - Tools Used Forceps;Scalpel   Selective Debridement - Tissue Removed slough, dead skin and callous   Wound Therapy - Clinical Statement Wound is progressing well with improved granulation tissues following debridement for removal of slough and dry skin perimeter of wound.  Good skin integrity noted surrounding wound.  Continued with xeroform, gauze and medipore tape dressings.  No reports of pain through session.     Wound Therapy - Functional Problem List dificulty walking, dificulty wearing shoes    Factors Delaying/Impairing Wound Healing Altered sensation;Diabetes Mellitus;Infection - systemic/local;Immobility;Multiple medical problems;Vascular compromise   Hydrotherapy Plan Debridement;Dressing change;Patient/family education   Wound Therapy - Frequency Other (comment)  2x/week   Wound Therapy - Current Recommendations PT   Wound Plan Appropriate dressing for healing wound environment, , debridement, tape    Dressing  xeroform, 2x2, gauze, tape                    PT Short Term Goals - 05/09/16 1223       PT SHORT TERM GOAL #1   Title Patient to demonstrate reduction of wound area by at least 50% on all measured dimensions in order to demonstrate improvement of condition    Time 4   Period Weeks   Status New     PT SHORT TERM GOAL #2   Title Patient will verbally be able to verbally state at least 3 methods of foot care/ways to prevent further wound formation in order to prevent wound recurrence    Time 4   Period Weeks   Status New           PT Long Term Goals - 05/09/16 1224      PT LONG TERM GOAL #1   Title Patient to be able to wear all pairs of shoes without exacerbation of R great toe in order to improve QOL and recurrence of wound    Time 8   Period Weeks   Status New  PT LONG TERM GOAL #2   Title Patient to display full wound closure in order to demonstrate resolution of condition    Time 8   Period Weeks   Status New     PT LONG TERM GOAL #3   Title Patient to be able to verbally state the importance of maintaining blood sugars in relation to proper wound healing and general health status    Time 8   Period Weeks   Status New             Patient will benefit from skilled therapeutic intervention in order to improve the following deficits and impairments:     Visit Diagnosis: Unspecified superficial injury of right great toe, sequela  Difficulty in walking, not elsewhere classified  Unsteadiness on feet  Non-pressure chronic ulcer of other part of right foot with necrosis of muscle ([place])     Problem List Patient Active Problem List   Diagnosis Date Noted  . Nausea & vomiting 02/24/2016  . A-fib ([place]) 02/23/2016  . Hypothyroidism 02/23/2016  . CKD (chronic kidney disease), stage III 02/23/2016  . Anxiety 02/23/2016  . Chronic combined systolic (congestive) and diastolic (congestive) heart failure 02/23/2016  . Dizziness 02/23/2016  . Open wound of right foot 02/23/2016  . Rectal bleeding 07/28/2015  . Chronic anticoagulation  07/28/2015  . PVD (peripheral vascular disease) ([place])   . S/P angioplasty 04/23/2015  . Peripheral arterial occlusive disease ([place]) 04/23/2015  . PAD (peripheral artery disease) ([place])   . Critical lower limb ischemia   . Toe ulcer, right ([place])   . Type II or unspecified type diabetes mellitus without mention of complication, uncontrolled 11/27/2013  . Acute respiratory failure with hypoxia ([place]) 11/26/2013  . CAP (community acquired pneumonia) 11/26/2013  . CHF exacerbation ([place]) 11/25/2013  . Cardiomyopathy, ischemic 06/13/2013  . At risk for sudden cardiac death 06/12/13  . S/P cardiac catheterization, Rt & Lt heart cath 06/05/2013 June 12, 2013  . Acute on chronic combined systolic and diastolic HF (heart failure), NYHA class 3 ([place]) 06/04/2013  . Atrial flutter with rapid ventricular response ([place]) 06/02/2013  . Acute renal failure: Cr up to 1.8 06/02/2013  . Hyperkalemia 06/02/2013    Class: Acute  . LBBB (left bundle branch block) 06/01/2013  . Hypotension- secondary to AF and Diltiazem Rx 06/01/2013  . Obesity (BMI 30-39.9) 06/01/2013  . Acute on chronic diastolic heart failure - due to Afib AVR 06/01/2013  . Atrial fibrillation with RVR- converted with Amiodarone 05/31/2013  . Stroke- Lt brain 2003 04/08/2013  . Hemiplegia affecting right dominant side ([place]) 04/08/2013  . Cataract 04/08/2013  . GERD (gastroesophageal reflux disease) 04/08/2013  . S/P CABG x 3 - 2010 04/08/2013  . S/P AVR-tissue, 2010 04/08/2013  . DM2 (diabetes mellitus, type 2) ([place]) 04/08/2013  . Dyslipidemia 04/08/2013  . SUBACROMIAL BURSITIS, LEFT 11/05/2009   Ihor Austin, LPTA; CBIS [phone]  Aldona Lento 05/26/2016, 8:49 AM  [place], [name], [address] Phone: [phone]   Fax:  [phone]  Name: TAUNO VINCELETTE MRN: [id number] Date of Birth: March 02, 1946

## 2016-05-31 ENCOUNTER — Ambulatory Visit (HOSPITAL_COMMUNITY): Payer: Medicare Other | Admitting: Physical Therapy

## 2016-05-31 DIAGNOSIS — R262 Difficulty in walking, not elsewhere classified: Secondary | ICD-10-CM

## 2016-05-31 DIAGNOSIS — R2681 Unsteadiness on feet: Secondary | ICD-10-CM | POA: Diagnosis not present

## 2016-05-31 DIAGNOSIS — L97513 Non-pressure chronic ulcer of other part of right foot with necrosis of muscle: Secondary | ICD-10-CM | POA: Diagnosis not present

## 2016-05-31 DIAGNOSIS — S90931S Unspecified superficial injury of right great toe, sequela: Secondary | ICD-10-CM | POA: Diagnosis not present

## 2016-05-31 NOTE — Therapy (Signed)
[place], [name], [address] Phone: [phone]   Fax:  [phone]  Wound Care Therapy  Patient Details  Name: [name] MRN: [id number] Date of Birth: 1946-01-25 No Data Recorded  Encounter Date: 05/31/2016      PT End of Session - 05/31/16 1643    Visit Number 6   Number of Visits 16   Date for PT Re-Evaluation 06/13/16   Authorization Type UHC Medicare (need to charge KX moving forward)   Authorization Time Period 05/09/16 to 07/09/16   Authorization - Visit Number 6   Authorization - Number of Visits 10      Past Medical History:  Diagnosis Date  . At risk for sudden cardiac death Jun 10, 2013  . Cataract   . Chronic anticoagulation 05/2013   started  . Chronic kidney disease    Patient reports he is seeing Kidney doctor in September  . Coronary artery disease   . Diabetes mellitus    type 2   . Dyslipidemia   . GERD (gastroesophageal reflux disease)   . History of valve replacement 2010   [facility] Ease bioprosthetic 106mm  . Hypertension   . PAF (paroxysmal atrial fibrillation) ([place]) 05/2013   converted with amiodarone to SR  . S/P CABG x 3 2010  . S/P cardiac catheterization, Rt & Lt heart cath 06/05/2013 2013-06-10  . Stroke [facility]) 2003   R-sided weakness & upper extremity swelling   . Thyroid disease     Past Surgical History:  Procedure Laterality Date  . AORTIC VALVE REPLACEMENT  01/26/2009   [facility] Ease bioprosthetic 58mm valve  . CARDIAC CATHETERIZATION  06/05/2013   native 3 vessel disease; patent LIMA to LAD, SVG to OM and SVG to PDA; Elevated right and left heart filling pressures, with predominantly pulmonary venous hypertension, normal PVR  . COLONOSCOPY  2007   Dr. Aviva Signs: internal hemorrhoids  . CORONARY ARTERY BYPASS GRAFT  01/26/2009   LIMA to LAD, SVG to circumflex, SVG to PDA  . GRAFT(S) ANGIOGRAM  06/05/2013   Procedure: GRAFT(S) Cyril Loosen;   Surgeon: Leonie Man, MD;  Location: [facility] CATH LAB;  Service: Cardiovascular;;  . LEFT AND RIGHT HEART CATHETERIZATION WITH CORONARY ANGIOGRAM N/A 06/05/2013   Procedure: LEFT AND RIGHT HEART CATHETERIZATION WITH CORONARY ANGIOGRAM;  Surgeon: Leonie Man, MD;  Location: [facility] CATH LAB;  Service: Cardiovascular;  Laterality: N/A;  . TRANSTHORACIC ECHOCARDIOGRAM  09/2011   grade 1 diastolic dysfunction; increasing valve gradient; calcified MV annulus   . TRANSTHORACIC ECHOCARDIOGRAM  06/03/2013   EF 25-30%, mod conc. hypertrophy, grade 3 diastolic dysfunction; LA mod dilated; calcified MV annulus; transaortic gradients are normal for the bioprosthetic valve; inf vena cava dilated (elevated CVP) - LifeVest    There were no vitals filed for this visit.                  Wound Therapy - 05/31/16 1640    Subjective Pain free, dressings intact   Patient and Family Stated Goals wound to heal    Date of Onset 02/18/13   Prior Treatments has received wound PT at this clinic multiple times    Pain Assessment No/denies pain   Wound Properties Date First Assessed: 05/09/16 Time First Assessed: 1206 Wound Type: Other (Comment) , R great toe wound   Location: Other (Comment) , R great toe   Location Orientation: Right Wound Description (Comments): R great toe  Present on Admission: Yes  Dressing Type Impregnated gauze (bismuth);Gauze (Comment);Other (Comment)  xeroform, 2x2, medipore tape   Dressing Changed Changed   Dressing Status None   Dressing Change Frequency PRN   % Wound base Red or Granulating 95%   % Wound base Yellow 5%   Margins Attached edges (approximated)   Drainage Amount Scant   Drainage Description Serosanguineous   Treatment Cleansed;Debridement (Selective)   Wound Properties Date First Assessed: 02/23/16 Wound Type: Other (Comment) , open area R great toe   Location: Other (Comment) , Medial R great toe   Location Orientation: Right;Medial Present on Admission: Yes    Incision Properties Date First Assessed: 04/23/15 Time First Assessed: 1738 Location: Groin Location Orientation: Left Present on Admission: No   Selective Debridement - Location slough in wound, edges and callous beside wound   Selective Debridement - Tools Used Forceps;Scalpel   Selective Debridement - Tissue Removed slough, dead skin and callous   Wound Therapy - Clinical Statement Wound continues to approximate with increased granulation.  Able to remove all dry tissue and callous around the wound with everything healed beneath area.  Pt without pain and no drainage or bleeding with debridement.  Anticipate wound to be healed soon.     Wound Therapy - Functional Problem List dificulty walking, dificulty wearing shoes    Factors Delaying/Impairing Wound Healing Altered sensation;Diabetes Mellitus;Infection - systemic/local;Immobility;Multiple medical problems;Vascular compromise   Hydrotherapy Plan Debridement;Dressing change;Patient/family education   Wound Therapy - Frequency Other (comment)  2x/week   Wound Therapy - Current Recommendations PT   Wound Plan Appropriate dressing for healing wound environment, , debridement, tape    Dressing  xeroform, 2x2, gauze, tape                    PT Short Term Goals - 05/09/16 1223      PT SHORT TERM GOAL #1   Title Patient to demonstrate reduction of wound area by at least 50% on all measured dimensions in order to demonstrate improvement of condition    Time 4   Period Weeks   Status New     PT SHORT TERM GOAL #2   Title Patient will verbally be able to verbally state at least 3 methods of foot care/ways to prevent further wound formation in order to prevent wound recurrence    Time 4   Period Weeks   Status New           PT Long Term Goals - 05/09/16 1224      PT LONG TERM GOAL #1   Title Patient to be able to wear all pairs of shoes without exacerbation of R great toe in order to improve QOL and recurrence of wound     Time 8   Period Weeks   Status New     PT LONG TERM GOAL #2   Title Patient to display full wound closure in order to demonstrate resolution of condition    Time 8   Period Weeks   Status New     PT LONG TERM GOAL #3   Title Patient to be able to verbally state the importance of maintaining blood sugars in relation to proper wound healing and general health status    Time 8   Period Weeks   Status New             Patient will benefit from skilled therapeutic intervention in order to improve the following deficits and impairments:     Visit Diagnosis: Unspecified superficial  injury of right great toe, sequela  Difficulty in walking, not elsewhere classified  Unsteadiness on feet     Problem List Patient Active Problem List   Diagnosis Date Noted  . Nausea & vomiting 02/24/2016  . A-fib ([place]) 02/23/2016  . Hypothyroidism 02/23/2016  . CKD (chronic kidney disease), stage III 02/23/2016  . Anxiety 02/23/2016  . Chronic combined systolic (congestive) and diastolic (congestive) heart failure 02/23/2016  . Dizziness 02/23/2016  . Open wound of right foot 02/23/2016  . Rectal bleeding 07/28/2015  . Chronic anticoagulation 07/28/2015  . PVD (peripheral vascular disease) ([place])   . S/P angioplasty 04/23/2015  . Peripheral arterial occlusive disease ([place]) 04/23/2015  . PAD (peripheral artery disease) ([place])   . Critical lower limb ischemia   . Toe ulcer, right ([place])   . Type II or unspecified type diabetes mellitus without mention of complication, uncontrolled 11/27/2013  . Acute respiratory failure with hypoxia ([place]) 11/26/2013  . CAP (community acquired pneumonia) 11/26/2013  . CHF exacerbation ([place]) 11/25/2013  . Cardiomyopathy, ischemic 06/13/2013  . At risk for sudden cardiac death 06-13-2013  . S/P cardiac catheterization, Rt & Lt heart cath 06/05/2013 2013/06/13  . Acute on chronic combined systolic and diastolic HF (heart failure), NYHA class 3 ([place])  06/04/2013  . Atrial flutter with rapid ventricular response ([place]) 06/02/2013  . Acute renal failure: Cr up to 1.8 06/02/2013  . Hyperkalemia 06/02/2013    Class: Acute  . LBBB (left bundle branch block) 06/01/2013  . Hypotension- secondary to AF and Diltiazem Rx 06/01/2013  . Obesity (BMI 30-39.9) 06/01/2013  . Acute on chronic diastolic heart failure - due to Afib AVR 06/01/2013  . Atrial fibrillation with RVR- converted with Amiodarone 05/31/2013  . Stroke- Lt brain 2003 04/08/2013  . Hemiplegia affecting right dominant side ([place]) 04/08/2013  . Cataract 04/08/2013  . GERD (gastroesophageal reflux disease) 04/08/2013  . S/P CABG x 3 - 2010 04/08/2013  . S/P AVR-tissue, 2010 04/08/2013  . DM2 (diabetes mellitus, type 2) ([place]) 04/08/2013  . Dyslipidemia 04/08/2013  . SUBACROMIAL BURSITIS, LEFT 11/05/2009    Teena Irani, PTA/CLT [phone]  05/31/2016, 4:44 PM  [place] [address] [place], [name], [address] Phone: [phone]   Fax:  [phone]  Name: TREVONTE TERRASI MRN: [id number] Date of Birth: 05-08-1946

## 2016-06-03 ENCOUNTER — Ambulatory Visit (HOSPITAL_COMMUNITY): Payer: Medicare Other

## 2016-06-03 DIAGNOSIS — L97513 Non-pressure chronic ulcer of other part of right foot with necrosis of muscle: Secondary | ICD-10-CM | POA: Diagnosis not present

## 2016-06-03 DIAGNOSIS — S90931S Unspecified superficial injury of right great toe, sequela: Secondary | ICD-10-CM | POA: Diagnosis not present

## 2016-06-03 DIAGNOSIS — R262 Difficulty in walking, not elsewhere classified: Secondary | ICD-10-CM

## 2016-06-03 DIAGNOSIS — R2681 Unsteadiness on feet: Secondary | ICD-10-CM | POA: Diagnosis not present

## 2016-06-03 NOTE — Therapy (Signed)
[place], [name], [address] Phone: [phone]   Fax:  [phone]  Wound Care Therapy  Patient Details  Name: [name] MRN: [id number] Date of Birth: 1946-01-04 No Data Recorded  Encounter Date: 06/03/2016      PT End of Session - 06/03/16 1328    Visit Number 7   Number of Visits 16   Date for PT Re-Evaluation 06/13/16   Authorization Type UHC Medicare (need to charge KX moving forward)   Authorization Time Period 05/09/16 to 07/09/16   Authorization - Visit Number 7   Authorization - Number of Visits 10   PT Start Time 1301   PT Stop Time 1322   PT Time Calculation (min) 21 min   Activity Tolerance Patient tolerated treatment well   Behavior During Therapy [facility] for tasks assessed/performed      Past Medical History:  Diagnosis Date  . At risk for sudden cardiac death 2013/06/23  . Cataract   . Chronic anticoagulation 05/2013   started  . Chronic kidney disease    Patient reports he is seeing Kidney doctor in September  . Coronary artery disease   . Diabetes mellitus    type 2   . Dyslipidemia   . GERD (gastroesophageal reflux disease)   . History of valve replacement 2010   [facility] Ease bioprosthetic 93mm  . Hypertension   . PAF (paroxysmal atrial fibrillation) ([place]) 05/2013   converted with amiodarone to SR  . S/P CABG x 3 2010  . S/P cardiac catheterization, Rt & Lt heart cath 06/05/2013 06/23/2013  . Stroke [facility]) 2003   R-sided weakness & upper extremity swelling   . Thyroid disease     Past Surgical History:  Procedure Laterality Date  . AORTIC VALVE REPLACEMENT  01/26/2009   [facility] Ease bioprosthetic 30mm valve  . CARDIAC CATHETERIZATION  06/05/2013   native 3 vessel disease; patent LIMA to LAD, SVG to OM and SVG to PDA; Elevated right and left heart filling pressures, with predominantly pulmonary venous hypertension, normal PVR  . COLONOSCOPY  2007   Dr. Aviva Signs: internal hemorrhoids  . CORONARY ARTERY BYPASS GRAFT  01/26/2009   LIMA to LAD, SVG to circumflex, SVG to PDA  . GRAFT(S) ANGIOGRAM  06/05/2013   Procedure: GRAFT(S) Cyril Loosen;  Surgeon: Leonie Man, MD;  Location: [facility] CATH LAB;  Service: Cardiovascular;;  . LEFT AND RIGHT HEART CATHETERIZATION WITH CORONARY ANGIOGRAM N/A 06/05/2013   Procedure: LEFT AND RIGHT HEART CATHETERIZATION WITH CORONARY ANGIOGRAM;  Surgeon: Leonie Man, MD;  Location: [facility] CATH LAB;  Service: Cardiovascular;  Laterality: N/A;  . TRANSTHORACIC ECHOCARDIOGRAM  09/2011   grade 1 diastolic dysfunction; increasing valve gradient; calcified MV annulus   . TRANSTHORACIC ECHOCARDIOGRAM  06/03/2013   EF 25-30%, mod conc. hypertrophy, grade 3 diastolic dysfunction; LA mod dilated; calcified MV annulus; transaortic gradients are normal for the bioprosthetic valve; inf vena cava dilated (elevated CVP) - LifeVest    There were no vitals filed for this visit.       Subjective Assessment - 06/03/16 1324    Subjective Feeling good no reports of pain today, dressings are intact    Pertinent History Patient reports that this wound started as a small little brown spot on the end of his toe that just kept getting bigger; he thinks it started about 8 months ago. He reports that it is now slowly getting better but has been healing slow. He  states  that it has been getting better.    Currently in Pain? No/denies                   Wound Therapy - 06/03/16 1325    Subjective Feeling good no reports of pain today, dressings are intact    Patient and Family Stated Goals wound to heal    Date of Onset 02/18/13   Prior Treatments has received wound PT at this clinic multiple times    Pain Assessment No/denies pain   Evaluation and Treatment Procedures Explained to Patient/Family Yes   Evaluation and Treatment Procedures agreed to   Wound Properties Date First Assessed: 05/09/16 Time First Assessed: 1206 Wound  Type: Other (Comment) , R great toe wound   Location: Other (Comment) , R great toe   Location Orientation: Right Wound Description (Comments): R great toe  Present on Admission: Yes   Dressing Type Impregnated gauze (bismuth);Gauze (Comment);Other (Comment)  xeroform, 2x2 and medipore tape   Dressing Changed Changed   Dressing Status None   Dressing Change Frequency PRN   % Wound base Red or Granulating 35%   % Wound base Yellow 5%   Margins Attached edges (approximated)   Drainage Amount Scant   Drainage Description Serosanguineous   Treatment Cleansed;Debridement (Selective)   Wound Properties Date First Assessed: 02/23/16 Wound Type: Other (Comment) , open area R great toe   Location: Other (Comment) , Medial R great toe   Location Orientation: Right;Medial Present on Admission: Yes   Incision Properties Date First Assessed: 04/23/15 Time First Assessed: 1738 Location: Groin Location Orientation: Left Present on Admission: No   Selective Debridement - Location slough in wound, edges and callous beside wound   Selective Debridement - Tools Used Forceps;Scalpel   Selective Debridement - Tissue Removed slough, dead skin and callous   Wound Therapy - Clinical Statement Wound is progressing well with increased approximation and granulation tissue.  Minimal debridement necessary this session for removal of dry skin and callous perimeter of wound.  Continued with xeroform, 2x2 and medipore tape.  Based on progress anticipate full healing soon.     Wound Therapy - Functional Problem List dificulty walking, dificulty wearing shoes    Factors Delaying/Impairing Wound Healing Altered sensation;Diabetes Mellitus;Infection - systemic/local;Immobility;Multiple medical problems;Vascular compromise   Hydrotherapy Plan Debridement;Dressing change;Patient/family education   Wound Therapy - Frequency --  2x/ week   Wound Therapy - Current Recommendations PT   Wound Plan Appropriate dressing for healing  wound environment, , debridement, tape    Dressing  xeroform, 2x2, gauze, tape                    PT Short Term Goals - 05/09/16 1223      PT SHORT TERM GOAL #1   Title Patient to demonstrate reduction of wound area by at least 50% on all measured dimensions in order to demonstrate improvement of condition    Time 4   Period Weeks   Status New     PT SHORT TERM GOAL #2   Title Patient will verbally be able to verbally state at least 3 methods of foot care/ways to prevent further wound formation in order to prevent wound recurrence    Time 4   Period Weeks   Status New           PT Long Term Goals - 05/09/16 1224      PT LONG TERM GOAL #1   Title Patient to be  able to wear all pairs of shoes without exacerbation of R great toe in order to improve QOL and recurrence of wound    Time 8   Period Weeks   Status New     PT LONG TERM GOAL #2   Title Patient to display full wound closure in order to demonstrate resolution of condition    Time 8   Period Weeks   Status New     PT LONG TERM GOAL #3   Title Patient to be able to verbally state the importance of maintaining blood sugars in relation to proper wound healing and general health status    Time 8   Period Weeks   Status New             Patient will benefit from skilled therapeutic intervention in order to improve the following deficits and impairments:     Visit Diagnosis: Unspecified superficial injury of right great toe, sequela  Difficulty in walking, not elsewhere classified  Unsteadiness on feet     Problem List Patient Active Problem List   Diagnosis Date Noted  . Nausea & vomiting 02/24/2016  . A-fib ([place]) 02/23/2016  . Hypothyroidism 02/23/2016  . CKD (chronic kidney disease), stage III 02/23/2016  . Anxiety 02/23/2016  . Chronic combined systolic (congestive) and diastolic (congestive) heart failure 02/23/2016  . Dizziness 02/23/2016  . Open wound of right foot 02/23/2016   . Rectal bleeding 07/28/2015  . Chronic anticoagulation 07/28/2015  . PVD (peripheral vascular disease) ([place])   . S/P angioplasty 04/23/2015  . Peripheral arterial occlusive disease ([place]) 04/23/2015  . PAD (peripheral artery disease) ([place])   . Critical lower limb ischemia   . Toe ulcer, right ([place])   . Type II or unspecified type diabetes mellitus without mention of complication, uncontrolled 11/27/2013  . Acute respiratory failure with hypoxia ([place]) 11/26/2013  . CAP (community acquired pneumonia) 11/26/2013  . CHF exacerbation ([place]) 11/25/2013  . Cardiomyopathy, ischemic 06/13/2013  . At risk for sudden cardiac death Jun 16, 2013  . S/P cardiac catheterization, Rt & Lt heart cath 06/05/2013 Jun 16, 2013  . Acute on chronic combined systolic and diastolic HF (heart failure), NYHA class 3 ([place]) 06/04/2013  . Atrial flutter with rapid ventricular response ([place]) 06/02/2013  . Acute renal failure: Cr up to 1.8 06/02/2013  . Hyperkalemia 06/02/2013    Class: Acute  . LBBB (left bundle branch block) 06/01/2013  . Hypotension- secondary to AF and Diltiazem Rx 06/01/2013  . Obesity (BMI 30-39.9) 06/01/2013  . Acute on chronic diastolic heart failure - due to Afib AVR 06/01/2013  . Atrial fibrillation with RVR- converted with Amiodarone 05/31/2013  . Stroke- Lt brain 2003 04/08/2013  . Hemiplegia affecting right dominant side ([place]) 04/08/2013  . Cataract 04/08/2013  . GERD (gastroesophageal reflux disease) 04/08/2013  . S/P CABG x 3 - 2010 04/08/2013  . S/P AVR-tissue, 2010 04/08/2013  . DM2 (diabetes mellitus, type 2) ([place]) 04/08/2013  . Dyslipidemia 04/08/2013  . SUBACROMIAL BURSITIS, LEFT 11/05/2009   Ihor Austin, LPTA; CBIS [phone]  Aldona Lento 06/03/2016, 1:30 PM  [place], [name], [address] Phone: [phone]   Fax:  [phone]  Name: RONDALE WASSENBERG MRN: [id number] Date of Birth:  12/21/1945

## 2016-06-08 ENCOUNTER — Ambulatory Visit (HOSPITAL_COMMUNITY): Payer: Medicare Other | Admitting: Physical Therapy

## 2016-06-08 DIAGNOSIS — R2681 Unsteadiness on feet: Secondary | ICD-10-CM | POA: Diagnosis not present

## 2016-06-08 DIAGNOSIS — R262 Difficulty in walking, not elsewhere classified: Secondary | ICD-10-CM

## 2016-06-08 DIAGNOSIS — S90931S Unspecified superficial injury of right great toe, sequela: Secondary | ICD-10-CM | POA: Diagnosis not present

## 2016-06-08 DIAGNOSIS — L97513 Non-pressure chronic ulcer of other part of right foot with necrosis of muscle: Secondary | ICD-10-CM

## 2016-06-08 NOTE — Therapy (Signed)
[name], [name], [address] Phone: [phone]   Fax:  [phone]  Wound Care Therapy  Patient Details  Name: [name] MRN: [id number] Date of Birth: [birth year] No Data Recorded  Encounter Date: 06/08/2016      PT End of Session - 06/08/16 0904    Visit Number 8   Number of Visits 16   Date for PT Re-Evaluation 06/13/16   Authorization Type UHC Medicare (need to charge KX moving forward)   Authorization Time Period 05/09/16 to 07/09/16   Authorization - Visit Number 8   Authorization - Number of Visits 10   PT Start Time 0805   PT Stop Time 0825   PT Time Calculation (min) 20 min   Activity Tolerance Patient tolerated treatment well   Behavior During Therapy [facility] for tasks assessed/performed      Past Medical History:  Diagnosis Date  . At risk for sudden cardiac death 19-Jun-2013  . Cataract   . Chronic anticoagulation 05/2013   started  . Chronic kidney disease    Patient reports he is seeing Kidney doctor in September  . Coronary artery disease   . Diabetes mellitus    type 2   . Dyslipidemia   . GERD (gastroesophageal reflux disease)   . History of valve replacement 2010   [facility] Ease bioprosthetic 71mm  . Hypertension   . PAF (paroxysmal atrial fibrillation) ([place]) 05/2013   converted with amiodarone to SR  . S/P CABG x 3 2010  . S/P cardiac catheterization, Rt & Lt heart cath 06/05/2013 06/19/2013  . Stroke [facility]) 2003   R-sided weakness & upper extremity swelling   . Thyroid disease     Past Surgical History:  Procedure Laterality Date  . AORTIC VALVE REPLACEMENT  01/26/2009   [facility] Ease bioprosthetic 75mm valve  . CARDIAC CATHETERIZATION  06/05/2013   native 3 vessel disease; patent LIMA to LAD, SVG to OM and SVG to PDA; Elevated right and left heart filling pressures, with predominantly pulmonary venous hypertension, normal PVR  . COLONOSCOPY  2007   Dr. Aviva Signs: internal hemorrhoids  . CORONARY ARTERY BYPASS GRAFT  01/26/2009   LIMA to LAD, SVG to circumflex, SVG to PDA  . GRAFT(S) ANGIOGRAM  06/05/2013   Procedure: GRAFT(S) Cyril Loosen;  Surgeon: Leonie Man, MD;  Location[facility] C[facility] LAB;  Service: Cardiovascular;;  . LEFT AND RIGHT HEART CATHETERIZATION WITH CORONARY ANGIOGRAM N/A 06/05/2013   Procedure: LEFT AND RIGHT HEART CATHETERIZATION WITH CORONARY ANGIOGRAM;  Surgeon: Leonie Man, MD;  Location: [facility] CATH LAB;  Service: Cardiovascular;  Laterality: N/A;  . TRANSTHORACIC ECHOCARDIOGRAM  09/2011   grade 1 diastolic dysfunction; increasing valve gradient; calcified MV annulus   . TRANSTHORACIC ECHOCARDIOGRAM  06/03/2013   EF 25-30%, mod conc. hypertrophy, grade 3 diastolic dysfunction; LA mod dilated; calcified MV annulus; transaortic gradients are normal for the bioprosthetic valve; inf vena cava dilated (elevated CVP) - LifeVest    There were no vitals filed for this visit.                  Wound Therapy - 06/08/16 0901    Subjective Pt states he hopes his toe is all healed to go out of town week after next   Patient and Family Stated Goals wound to heal    Date of Onset 02/18/13   Prior Treatments has received wound PT at this clinic multiple times  Pain Assessment No/denies pain   Evaluation and Treatment Procedures Explained to Patient/Family Yes   Evaluation and Treatment Procedures agreed to   Wound Properties Date First Assessed: 05/09/16 Time First Assessed: 1206 Wound Type: Other (Comment) , R great toe wound   Location: Other (Comment) , R great toe   Location Orientation: Right Wound Description (Comments): R great toe  Present on Admission: Yes   Dressing Type Impregnated gauze (bismuth);Gauze (Comment);Other (Comment)  xeroform, 2x2 and medipore tape   Dressing Changed Changed   Dressing Status None   Dressing Change Frequency PRN   % Wound base Red or Granulating 100%   % Wound base Yellow 0%    Margins Attached edges (approximated)   Drainage Amount Scant   Drainage Description Serosanguineous   Treatment Cleansed;Debridement (Selective)   Wound Properties Date First Assessed: 02/23/16 Wound Type: Other (Comment) , open area R great toe   Location: Other (Comment) , Medial R great toe   Location Orientation: Right;Medial Present on Admission: Yes   Incision Properties Date First Assessed: 04/23/15 Time First Assessed: 1738 Location: Groin Location Orientation: Left Present on Admission: No   Selective Debridement - Location --   Selective Debridement - Tools Used --   Selective Debridement - Tissue Removed --   Wound Therapy - Clinical Statement No sharps debridement needed this session, able to remove slough using moist gauze.  wound is 100% granulated and patient most likely will be ready for discharge next visit.     Wound Therapy - Functional Problem List dificulty walking, dificulty wearing shoes    Factors Delaying/Impairing Wound Healing Altered sensation;Diabetes Mellitus;Infection - systemic/local;Immobility;Multiple medical problems;Vascular compromise   Hydrotherapy Plan Debridement;Dressing change;Patient/family education   Wound Therapy - Frequency --  2x/ week   Wound Therapy - Current Recommendations PT   Wound Plan Appropriate dressing for healing wound environment, , debridement, tape    Dressing  xeroform, 2x2, gauze, tape                    PT Short Term Goals - 05/09/16 1223      PT SHORT TERM GOAL #1   Title Patient to demonstrate reduction of wound area by at least 50% on all measured dimensions in order to demonstrate improvement of condition    Time 4   Period Weeks   Status New     PT SHORT TERM GOAL #2   Title Patient will verbally be able to verbally state at least 3 methods of foot care/ways to prevent further wound formation in order to prevent wound recurrence    Time 4   Period Weeks   Status New           PT Long Term Goals  - 05/09/16 1224      PT LONG TERM GOAL #1   Title Patient to be able to wear all pairs of shoes without exacerbation of R great toe in order to improve QOL and recurrence of wound    Time 8   Period Weeks   Status New     PT LONG TERM GOAL #2   Title Patient to display full wound closure in order to demonstrate resolution of condition    Time 8   Period Weeks   Status New     PT LONG TERM GOAL #3   Title Patient to be able to verbally state the importance of maintaining blood sugars in relation to proper wound healing and general health status  Time 8   Period Weeks   Status New             Patient will benefit from skilled therapeutic intervention in order to improve the following deficits and impairments:     Visit Diagnosis: Unspecified superficial injury of right great toe, sequela  Difficulty in walking, not elsewhere classified  Unsteadiness on feet  Non-pressure chronic ulcer of other part of right foot with necrosis of muscle ([place])     Problem List Patient Active Problem List   Diagnosis Date Noted  . Nausea & vomiting 02/24/2016  . A-fib ([place]) 02/23/2016  . Hypothyroidism 02/23/2016  . CKD (chronic kidney disease), stage III 02/23/2016  . Anxiety 02/23/2016  . Chronic combined systolic (congestive) and diastolic (congestive) heart failure 02/23/2016  . Dizziness 02/23/2016  . Open wound of right foot 02/23/2016  . Rectal bleeding 07/28/2015  . Chronic anticoagulation 07/28/2015  . PVD (peripheral vascular disease) ([place])   . S/P angioplasty 04/23/2015  . Peripheral arterial occlusive disease ([place]) 04/23/2015  . PAD (peripheral artery disease) ([place])   . Critical lower limb ischemia   . Toe ulcer, right ([place])   . Type II or unspecified type diabetes mellitus without mention of complication, uncontrolled 11/27/2013  . Acute respiratory failure with hypoxia ([place]) 11/26/2013  . CAP (community acquired pneumonia) 11/26/2013  . CHF exacerbation  ([place]) 11/25/2013  . Cardiomyopathy, ischemic 06/13/2013  . At risk for sudden cardiac death 06-13-2013  . S/P cardiac catheterization, Rt & Lt heart cath 06/05/2013 06-13-13  . Acute on chronic combined systolic and diastolic HF (heart failure), NYHA class 3 ([place]) 06/04/2013  . Atrial flutter with rapid ventricular response ([place]) 06/02/2013  . Acute renal failure: Cr up to 1.8 06/02/2013  . Hyperkalemia 06/02/2013    Class: Acute  . LBBB (left bundle branch block) 06/01/2013  . Hypotension- secondary to AF and Diltiazem Rx 06/01/2013  . Obesity (BMI 30-39.9) 06/01/2013  . Acute on chronic diastolic heart failure - due to Afib AVR 06/01/2013  . Atrial fibrillation with RVR- converted with Amiodarone 05/31/2013  . Stroke- Lt brain 2003 04/08/2013  . Hemiplegia affecting right dominant side ([place]) 04/08/2013  . Cataract 04/08/2013  . GERD (gastroesophageal reflux disease) 04/08/2013  . S/P CABG x 3 - 2010 04/08/2013  . S/P AVR-tissue, 2010 04/08/2013  . DM2 (diabetes mellitus, type 2) ([place]) 04/08/2013  . Dyslipidemia 04/08/2013  . SUBACROMIAL BURSITIS, LEFT 11/05/2009    Teena Irani, PTA/CLT [phone]  06/08/2016, 9:05 AM  [place], [name], [address] Phone: [phone]   Fax:  [phone]  Name: [name] MRN: [id number] Date of Birth: 1946/02/16

## 2016-06-10 ENCOUNTER — Ambulatory Visit (HOSPITAL_COMMUNITY): Payer: Medicare Other

## 2016-06-10 DIAGNOSIS — R2681 Unsteadiness on feet: Secondary | ICD-10-CM

## 2016-06-10 DIAGNOSIS — S90931S Unspecified superficial injury of right great toe, sequela: Secondary | ICD-10-CM

## 2016-06-10 DIAGNOSIS — R262 Difficulty in walking, not elsewhere classified: Secondary | ICD-10-CM | POA: Diagnosis not present

## 2016-06-10 DIAGNOSIS — L97513 Non-pressure chronic ulcer of other part of right foot with necrosis of muscle: Secondary | ICD-10-CM | POA: Diagnosis not present

## 2016-06-10 NOTE — Therapy (Signed)
[place], [name], [address] Phone: [phone]   Fax:  [phone]  Wound Care Therapy  Patient Details  Name: [name] MRN: [id number] Date of Birth: Nov 10, 1945 No Data Recorded  Encounter Date: 06/10/2016      PT End of Session - 06/10/16 0911    Visit Number 9   Number of Visits 16   Date for PT Re-Evaluation 06/13/16   Authorization Type UHC Medicare (need to charge KX moving forward)   Authorization Time Period 05/09/16 to 07/09/16   Authorization - Visit Number 9   Authorization - Number of Visits 10   PT Start Time [id number]   PT Stop Time 0905   PT Time Calculation (min) 18 min   Activity Tolerance Patient tolerated treatment well   Behavior During Therapy [facility] for tasks assessed/performed      Past Medical History:  Diagnosis Date  . At risk for sudden cardiac death 06/14/2013  . Cataract   . Chronic anticoagulation 05/2013   started  . Chronic kidney disease    Patient reports he is seeing Kidney doctor in September  . Coronary artery disease   . Diabetes mellitus    type 2   . Dyslipidemia   . GERD (gastroesophageal reflux disease)   . History of valve replacement 2010   [facility] Ease bioprosthetic 34mm  . Hypertension   . PAF (paroxysmal atrial fibrillation) ([place]) 05/2013   converted with amiodarone to SR  . S/P CABG x 3 2010  . S/P cardiac catheterization, Rt & Lt heart cath 06/05/2013 06-14-13  . Stroke [facility]) 2003   R-sided weakness & upper extremity swelling   . Thyroid disease     Past Surgical History:  Procedure Laterality Date  . AORTIC VALVE REPLACEMENT  01/26/2009   [facility] Ease bioprosthetic 67mm valve  . CARDIAC CATHETERIZATION  06/05/2013   native 3 vessel disease; patent LIMA to LAD, SVG to OM and SVG to PDA; Elevated right and left heart filling pressures, with predominantly pulmonary venous hypertension, normal PVR  . COLONOSCOPY  2007   Dr. Aviva Signs: internal hemorrhoids  . CORONARY ARTERY BYPASS GRAFT  01/26/2009   LIMA to LAD, SVG to circumflex, SVG to PDA  . GRAFT(S) ANGIOGRAM  06/05/2013   Procedure: GRAFT(S) Cyril Loosen;  Surgeon: Leonie Man, MD;  Location: [facility] CATH LAB;  Service: Cardiovascular;;  . LEFT AND RIGHT HEART CATHETERIZATION WITH CORONARY ANGIOGRAM N/A 06/05/2013   Procedure: LEFT AND RIGHT HEART CATHETERIZATION WITH CORONARY ANGIOGRAM;  Surgeon: Leonie Man, MD;  Location: [facility] CATH LAB;  Service: Cardiovascular;  Laterality: N/A;  . TRANSTHORACIC ECHOCARDIOGRAM  09/2011   grade 1 diastolic dysfunction; increasing valve gradient; calcified MV annulus   . TRANSTHORACIC ECHOCARDIOGRAM  06/03/2013   EF 25-30%, mod conc. hypertrophy, grade 3 diastolic dysfunction; LA mod dilated; calcified MV annulus; transaortic gradients are normal for the bioprosthetic valve; inf vena cava dilated (elevated CVP) - LifeVest    There were no vitals filed for this visit.       Subjective Assessment - 06/10/16 0905    Subjective Feeling good today, no reports of pain dressings are intact   Pertinent History Patient reports that this wound started as a small little brown spot on the end of his toe that just kept getting bigger; he thinks it started about 8 months ago. He reports that it is now slowly getting better but has been healing slow. He states  that it has been getting better.    Currently in Pain? No/denies                   Wound Therapy - 06/10/16 0905    Subjective Feeling good today, no reports of pain dressings are intact   Patient and Family Stated Goals wound to heal    Date of Onset 02/18/13   Prior Treatments has received wound PT at this clinic multiple times    Pain Assessment No/denies pain   Pain Score 0-No pain   Evaluation and Treatment Procedures Explained to Patient/Family Yes   Evaluation and Treatment Procedures agreed to   Wound Properties Date First Assessed: 05/09/16 Time First  Assessed: 1206 Wound Type: Other (Comment) , R great toe wound   Location: Other (Comment) , R great toe   Location Orientation: Right Wound Description (Comments): R great toe  Present on Admission: Yes   Dressing Type Impregnated gauze (bismuth);Gauze (Comment);Other (Comment)  xeroform, 2x2, medipore tape and netting   Dressing Changed Changed   Dressing Status None   Dressing Change Frequency PRN   % Wound base Red or Granulating 100%   % Wound base Yellow 0%   Margins Attached edges (approximated)   Drainage Amount Scant   Drainage Description Serosanguineous   Treatment Cleansed;Debridement (Selective)   Wound Properties Date First Assessed: 02/23/16 Wound Type: Other (Comment) , open area R great toe   Location: Other (Comment) , Medial R great toe   Location Orientation: Right;Medial Present on Admission: Yes   Incision Properties Date First Assessed: 04/23/15 Time First Assessed: 1738 Location: Groin Location Orientation: Left Present on Admission: No   Selective Debridement - Location slough in wound bed   Selective Debridement - Tools Used Other (comment)  removed with moist gauze   Selective Debridement - Tissue Removed slough   Wound Therapy - Clinical Statement Noted scant drainage from wound still present.  No sharps debridement necessary this session, able to remove slough with moist gauze.  Continued with xeroform, 2x2 and medipore tape.  Wound is 100% granulation tissue currently, anticipate DC next couple sessions.     Wound Therapy - Functional Problem List dificulty walking, dificulty wearing shoes    Factors Delaying/Impairing Wound Healing Altered sensation;Diabetes Mellitus;Infection - systemic/local;Immobility;Multiple medical problems;Vascular compromise   Hydrotherapy Plan Debridement;Dressing change;Patient/family education   Wound Therapy - Frequency --  2x/week   Wound Therapy - Current Recommendations PT   Wound Plan Appropriate dressing for healing wound  environment, , debridement, tape    Dressing  xeroform, 2x2, gauze, tape                    PT Short Term Goals - 05/09/16 1223      PT SHORT TERM GOAL #1   Title Patient to demonstrate reduction of wound area by at least 50% on all measured dimensions in order to demonstrate improvement of condition    Time 4   Period Weeks   Status New     PT SHORT TERM GOAL #2   Title Patient will verbally be able to verbally state at least 3 methods of foot care/ways to prevent further wound formation in order to prevent wound recurrence    Time 4   Period Weeks   Status New           PT Long Term Goals - 05/09/16 1224      PT LONG TERM GOAL #1   Title Patient to be  able to wear all pairs of shoes without exacerbation of R great toe in order to improve QOL and recurrence of wound    Time 8   Period Weeks   Status New     PT LONG TERM GOAL #2   Title Patient to display full wound closure in order to demonstrate resolution of condition    Time 8   Period Weeks   Status New     PT LONG TERM GOAL #3   Title Patient to be able to verbally state the importance of maintaining blood sugars in relation to proper wound healing and general health status    Time 8   Period Weeks   Status New             Patient will benefit from skilled therapeutic intervention in order to improve the following deficits and impairments:     Visit Diagnosis: Unspecified superficial injury of right great toe, sequela  Difficulty in walking, not elsewhere classified  Unsteadiness on feet     Problem List Patient Active Problem List   Diagnosis Date Noted  . Nausea & vomiting 02/24/2016  . A-fib ([place]) 02/23/2016  . Hypothyroidism 02/23/2016  . CKD (chronic kidney disease), stage III 02/23/2016  . Anxiety 02/23/2016  . Chronic combined systolic (congestive) and diastolic (congestive) heart failure 02/23/2016  . Dizziness 02/23/2016  . Open wound of right foot 02/23/2016  .  Rectal bleeding 07/28/2015  . Chronic anticoagulation 07/28/2015  . PVD (peripheral vascular disease) ([place])   . S/P angioplasty 04/23/2015  . Peripheral arterial occlusive disease ([place]) 04/23/2015  . PAD (peripheral artery disease) ([place])   . Critical lower limb ischemia   . Toe ulcer, right ([place])   . Type II or unspecified type diabetes mellitus without mention of complication, uncontrolled 11/27/2013  . Acute respiratory failure with hypoxia ([place]) 11/26/2013  . CAP (community acquired pneumonia) 11/26/2013  . CHF exacerbation ([place]) 11/25/2013  . Cardiomyopathy, ischemic 06/13/2013  . At risk for sudden cardiac death 07-03-13  . S/P cardiac catheterization, Rt & Lt heart cath 06/05/2013 03-Jul-2013  . Acute on chronic combined systolic and diastolic HF (heart failure), NYHA class 3 ([place]) 06/04/2013  . Atrial flutter with rapid ventricular response ([place]) 06/02/2013  . Acute renal failure: Cr up to 1.8 06/02/2013  . Hyperkalemia 06/02/2013    Class: Acute  . LBBB (left bundle branch block) 06/01/2013  . Hypotension- secondary to AF and Diltiazem Rx 06/01/2013  . Obesity (BMI 30-39.9) 06/01/2013  . Acute on chronic diastolic heart failure - due to Afib AVR 06/01/2013  . Atrial fibrillation with RVR- converted with Amiodarone 05/31/2013  . Stroke- Lt brain 2003 04/08/2013  . Hemiplegia affecting right dominant side ([name]) 04/08/2013  . Cataract 04/08/2013  . GERD (gastroesophageal reflux disease) 04/08/2013  . S/P CABG x 3 - 2010 04/08/2013  . S/P AVR-tissue, 2010 04/08/2013  . DM2 (diabetes mellitus, type 2) ([place]) 04/08/2013  . Dyslipidemia 04/08/2013  . SUBACROMIAL BURSITIS, LEFT 11/05/2009   Ihor Austin, LPTA; CBIS [phone]  Aldona Lento 06/10/2016, 9:13 AM  [place], [name], [address] Phone: [phone]   Fax:  [phone]  Name: [name] MRN: [id number] Date of Birth:  1945-10-28

## 2016-06-14 ENCOUNTER — Ambulatory Visit (HOSPITAL_COMMUNITY): Payer: Medicare Other | Admitting: Physical Therapy

## 2016-06-14 DIAGNOSIS — R2681 Unsteadiness on feet: Secondary | ICD-10-CM | POA: Diagnosis not present

## 2016-06-14 DIAGNOSIS — R262 Difficulty in walking, not elsewhere classified: Secondary | ICD-10-CM

## 2016-06-14 DIAGNOSIS — S90931S Unspecified superficial injury of right great toe, sequela: Secondary | ICD-10-CM | POA: Diagnosis not present

## 2016-06-14 DIAGNOSIS — L97513 Non-pressure chronic ulcer of other part of right foot with necrosis of muscle: Secondary | ICD-10-CM | POA: Diagnosis not present

## 2016-06-14 NOTE — Therapy (Signed)
[facility] [address] [place], [place], [address] Phone: [phone]   Fax:  [phone]  Wound Care Therapy  Patient Details  Name: [name] MRN: [id number] Date of Birth: 05/16/1946 No Data Recorded  Encounter Date: 06/14/2016      PT End of Session - 06/14/16 0902    Visit Number 10   Number of Visits 20   Date for PT Re-Evaluation 07/12/16   Authorization Type [employer] Medicare (need to charge KX moving forward)   Authorization Time Period 05/09/16 to 07/09/16   Authorization - Visit Number 10   Authorization - Number of Visits 20   PT Start Time 0815   PT Stop Time 0845   PT Time Calculation (min) 30 min   Activity Tolerance Patient tolerated treatment well   Behavior During Therapy WFL for tasks assessed/performed      Past Medical History:  Diagnosis Date  . At risk for sudden cardiac death 06/07/2013  . Cataract   . Chronic anticoagulation 05/2013   started  . Chronic kidney disease    Patient reports he is seeing Kidney doctor in September  . Coronary artery disease   . Diabetes mellitus    type 2   . Dyslipidemia   . GERD (gastroesophageal reflux disease)   . History of valve replacement 2010   [facility] Ease bioprosthetic 23mm  . Hypertension   . PAF (paroxysmal atrial fibrillation) (HCC) 05/2013   converted with amiodarone to SR  . S/P CABG x 3 2010  . S/P cardiac catheterization, Rt & Lt heart cath 06/05/2013 06/07/2013  . Stroke (HCC) 2003   R-sided weakness & upper extremity swelling   . Thyroid disease     Past Surgical History:  Procedure Laterality Date  . AORTIC VALVE REPLACEMENT  01/26/2009   [facility] Ease bioprosthetic 23mm valve  . CARDIAC CATHETERIZATION  06/05/2013   native 3 vessel disease; patent LIMA to LAD, SVG to OM and SVG to PDA; Elevated right and left heart filling pressures, with predominantly pulmonary venous hypertension, normal PVR  . COLONOSCOPY  2007   Dr.  Mark Jenkins: internal hemorrhoids  . CORONARY ARTERY BYPASS GRAFT  01/26/2009   LIMA to LAD, SVG to circumflex, SVG to PDA  . GRAFT(S) ANGIOGRAM  06/05/2013   Procedure: GRAFT(S) ANGIOGRAM;  Surgeon: [name] Harding, MD;  Location: [facility] CATH LAB;  Service: Cardiovascular;;  . LEFT AND RIGHT HEART CATHETERIZATION WITH CORONARY ANGIOGRAM N/A 06/05/2013   Procedure: LEFT AND RIGHT HEART CATHETERIZATION WITH CORONARY ANGIOGRAM;  Surgeon: [name] Harding, MD;  Location: [facility] CATH LAB;  Service: Cardiovascular;  Laterality: N/A;  . TRANSTHORACIC ECHOCARDIOGRAM  09/2011   grade 1 diastolic dysfunction; increasing valve gradient; calcified MV annulus   . TRANSTHORACIC ECHOCARDIOGRAM  06/03/2013   EF 25-30%, mod conc. hypertrophy, grade 3 diastolic dysfunction; LA mod dilated; calcified MV annulus; transaortic gradients are normal for the bioprosthetic valve; inf vena cava dilated (elevated CVP) - LifeVest    There were no vitals filed for this visit.                  Wound Therapy - 06/14/16 0852    Subjective No reports of pain dressings are intact.  States he is going to [place] next week   Patient and Family Stated Goals wound to heal    Date of Onset 02/18/13   Prior Treatments has received wound PT at this clinic multiple times    Pain   Assessment No/denies pain   Pain Score 0-No pain   Evaluation and Treatment Procedures Explained to Patient/Family Yes   Evaluation and Treatment Procedures agreed to   Wound Properties Date First Assessed: 05/09/16 Time First Assessed: 1206 Wound Type: Other (Comment) , R great toe wound   Location: Other (Comment) , R great toe   Location Orientation: Right Wound Description (Comments): R great toe  Present on Admission: Yes   Dressing Type Impregnated gauze (bismuth);Gauze (Comment);Other (Comment)  xeroform, 2x2, medipore tape and netting   Dressing Changed Changed   Dressing Status None   Dressing Change Frequency PRN   % Wound base Red or  Granulating 100%   % Wound base Yellow 0%   Wound Length (cm) 0.3 cm  was 0.5   Wound Width (cm) 0.5 cm  was 1.1 cm   Wound Depth (cm) 0 cm  was 0.1 cm   Margins Attached edges (approximated)   Drainage Amount Scant   Drainage Description Serosanguineous   Treatment Cleansed;Debridement (Selective)   Wound Properties Date First Assessed: 02/23/16 Wound Type: Other (Comment) , open area R great toe   Location: Other (Comment) , Medial R great toe   Location Orientation: Right;Medial Present on Admission: Yes   Incision Properties Date First Assessed: 04/23/15 Time First Assessed: 1738 Location: Groin Location Orientation: Left Present on Admission: No   Selective Debridement - Location slough in wound bed   Selective Debridement - Tools Used Other (comment)  removed with moist gauze   Selective Debridement - Tissue Removed slough   Wound Therapy - Clinical Statement Scant drainage from wound, still with small opening.  Dry skin perimeter of wound but no skin breakdowns.  Debrided slough from center and around edges to promote closure.  Pt will miss all next week due to being in [place] but is aware of how to take care of wound.     Wound Therapy - Functional Problem List dificulty walking, dificulty wearing shoes    Factors Delaying/Impairing Wound Healing Altered sensation;Diabetes Mellitus;Infection - systemic/local;Immobility;Multiple medical problems;Vascular compromise   Hydrotherapy Plan Debridement;Dressing change;Patient/family education   Wound Therapy - Frequency --  2x/week   Wound Therapy - Current Recommendations PT   Wound Plan Continue with skilled woundcare with dressing changes as appropriate.    Dressing  xeroform, 2x2, gauze, tape                    PT Short Term Goals - 06/14/16 0857      PT SHORT TERM GOAL #1   Title Patient to demonstrate reduction of wound area by at least 50% on all measured dimensions in order to demonstrate improvement of  condition    Time 4   Period Weeks   Status Achieved     PT SHORT TERM GOAL #2   Title Patient will verbally be able to verbally state at least 3 methods of foot care/ways to prevent further wound formation in order to prevent wound recurrence    Time 4   Period Weeks   Status Partially Met           PT Long Term Goals - 06/14/16 0858      PT LONG TERM GOAL #1   Title Patient to be able to wear all pairs of shoes without exacerbation of R great toe in order to improve QOL and recurrence of wound    Time 8   Period Weeks   Status Unable to assess       PT LONG TERM GOAL #2   Title Patient to display full wound closure in order to demonstrate resolution of condition    Time 8   Period Weeks   Status On-going     PT LONG TERM GOAL #3   Title Patient to be able to verbally state the importance of maintaining blood sugars in relation to proper wound healing and general health status    Time 8   Period Weeks   Status On-going             Patient will benefit from skilled therapeutic intervention in order to improve the following deficits and impairments:     Visit Diagnosis: Unspecified superficial injury of right great toe, sequela  Difficulty in walking, not elsewhere classified  Unsteadiness on feet    Problem List Patient Active Problem List   Diagnosis Date Noted  . Nausea & vomiting 02/24/2016  . A-fib (HCC) 02/23/2016  . Hypothyroidism 02/23/2016  . CKD (chronic kidney disease), stage III 02/23/2016  . Anxiety 02/23/2016  . Chronic combined systolic (congestive) and diastolic (congestive) heart failure 02/23/2016  . Dizziness 02/23/2016  . Open wound of right foot 02/23/2016  . Rectal bleeding 07/28/2015  . Chronic anticoagulation 07/28/2015  . PVD (peripheral vascular disease) (HCC)   . S/P angioplasty 04/23/2015  . Peripheral arterial occlusive disease (HCC) 04/23/2015  . PAD (peripheral artery disease) (HCC)   . Critical lower limb ischemia    . Toe ulcer, right (HCC)   . Type II or unspecified type diabetes mellitus without mention of complication, uncontrolled 11/27/2013  . Acute respiratory failure with hypoxia (HCC) 11/26/2013  . CAP (community acquired pneumonia) 11/26/2013  . CHF exacerbation (HCC) 11/25/2013  . Cardiomyopathy, ischemic 06/13/2013  . At risk for sudden cardiac death 06/07/2013  . S/P cardiac catheterization, Rt & Lt heart cath 06/05/2013 06/07/2013  . Acute on chronic combined systolic and diastolic HF (heart failure), NYHA class 3 (HCC) 06/04/2013  . Atrial flutter with rapid ventricular response (HCC) 06/02/2013  . Acute renal failure: Cr up to 1.8 06/02/2013  . Hyperkalemia 06/02/2013    Class: Acute  . LBBB (left bundle branch block) 06/01/2013  . Hypotension- secondary to AF and Diltiazem Rx 06/01/2013  . Obesity (BMI 30-39.9) 06/01/2013  . Acute on chronic diastolic heart failure - due to Afib AVR 06/01/2013  . Atrial fibrillation with RVR- converted with Amiodarone 05/31/2013  . Stroke- Lt brain 2003 04/08/2013  . Hemiplegia affecting right dominant side (HCC) 04/08/2013  . Cataract 04/08/2013  . GERD (gastroesophageal reflux disease) 04/08/2013  . S/P CABG x 3 - 2010 04/08/2013  . S/P AVR-tissue, 2010 04/08/2013  . DM2 (diabetes mellitus, type 2) (HCC) 04/08/2013  . Dyslipidemia 04/08/2013  . SUBACROMIAL BURSITIS, LEFT 11/05/2009    Amy B Frazier, PTA/CLT [phone]  06/14/2016, 4:37 PM       G-Codes - 06/14/16 1636    Functional Assessment Tool Used Based on skilled clinical assessment of wound size, slough, self care    Functional Limitation Other PT primary   Other PT Primary Current Status (G8990) At least 1 percent but less than 20 percent impaired, limited or restricted   Other PT Primary Goal Status (G8991) 0 percent impaired, limited or restricted      Kristen Unger PT, DPT [phone]   [facility] [address] [place], [place], [address] Phone: [phone]   Fax:  [phone]  Name: [name] MRN: [id number] Date of Birth:   04-22-1946

## 2016-06-16 ENCOUNTER — Encounter (HOSPITAL_COMMUNITY): Payer: Self-pay | Admitting: *Deleted

## 2016-06-16 ENCOUNTER — Ambulatory Visit (HOSPITAL_COMMUNITY)
Admission: RE | Admit: 2016-06-16 | Discharge: 2016-06-16 | Disposition: A | Discharge status: Home / Self Care | Payer: Medicare Other | Source: Ambulatory Visit | Attending: Internal Medicine | Admitting: Internal Medicine

## 2016-06-16 ENCOUNTER — Ambulatory Visit (HOSPITAL_COMMUNITY): Payer: Medicare Other | Admitting: Physical Therapy

## 2016-06-16 ENCOUNTER — Encounter (HOSPITAL_COMMUNITY)
Admission: RE | Disposition: A | Discharge status: Home / Self Care | Payer: Self-pay | Source: Ambulatory Visit | Attending: Internal Medicine

## 2016-06-16 DIAGNOSIS — K644 Residual hemorrhoidal skin tags: Secondary | ICD-10-CM | POA: Insufficient documentation

## 2016-06-16 DIAGNOSIS — K625 Hemorrhage of anus and rectum: Secondary | ICD-10-CM

## 2016-06-16 DIAGNOSIS — Z794 Long term (current) use of insulin: Secondary | ICD-10-CM | POA: Insufficient documentation

## 2016-06-16 DIAGNOSIS — I69351 Hemiplegia and hemiparesis following cerebral infarction affecting right dominant side: Secondary | ICD-10-CM | POA: Insufficient documentation

## 2016-06-16 DIAGNOSIS — K219 Gastro-esophageal reflux disease without esophagitis: Secondary | ICD-10-CM | POA: Diagnosis not present

## 2016-06-16 DIAGNOSIS — I2510 Atherosclerotic heart disease of native coronary artery without angina pectoris: Secondary | ICD-10-CM | POA: Insufficient documentation

## 2016-06-16 DIAGNOSIS — K635 Polyp of colon: Secondary | ICD-10-CM | POA: Insufficient documentation

## 2016-06-16 DIAGNOSIS — E1122 Type 2 diabetes mellitus with diabetic chronic kidney disease: Secondary | ICD-10-CM | POA: Insufficient documentation

## 2016-06-16 DIAGNOSIS — N189 Chronic kidney disease, unspecified: Secondary | ICD-10-CM | POA: Diagnosis not present

## 2016-06-16 DIAGNOSIS — Z951 Presence of aortocoronary bypass graft: Secondary | ICD-10-CM | POA: Diagnosis not present

## 2016-06-16 DIAGNOSIS — Z87891 Personal history of nicotine dependence: Secondary | ICD-10-CM | POA: Insufficient documentation

## 2016-06-16 DIAGNOSIS — Z7901 Long term (current) use of anticoagulants: Secondary | ICD-10-CM | POA: Insufficient documentation

## 2016-06-16 DIAGNOSIS — Z953 Presence of xenogenic heart valve: Secondary | ICD-10-CM | POA: Diagnosis not present

## 2016-06-16 DIAGNOSIS — K921 Melena: Secondary | ICD-10-CM | POA: Diagnosis not present

## 2016-06-16 DIAGNOSIS — I480 Paroxysmal atrial fibrillation: Secondary | ICD-10-CM | POA: Insufficient documentation

## 2016-06-16 DIAGNOSIS — Z79899 Other long term (current) drug therapy: Secondary | ICD-10-CM | POA: Diagnosis not present

## 2016-06-16 DIAGNOSIS — I129 Hypertensive chronic kidney disease with stage 1 through stage 4 chronic kidney disease, or unspecified chronic kidney disease: Secondary | ICD-10-CM | POA: Insufficient documentation

## 2016-06-16 DIAGNOSIS — D125 Benign neoplasm of sigmoid colon: Secondary | ICD-10-CM | POA: Diagnosis not present

## 2016-06-16 HISTORY — PX: COLONOSCOPY: SHX5424

## 2016-06-16 LAB — GLUCOSE, CAPILLARY: GLUCOSE-CAPILLARY: 211 mg/dL — AB (ref 65–99)

## 2016-06-16 SURGERY — COLONOSCOPY
Anesthesia: Moderate Sedation

## 2016-06-16 MED ORDER — MEPERIDINE HCL 50 MG/ML IJ SOLN
INTRAMUSCULAR | Status: AC
  Filled 2016-06-16: qty 1

## 2016-06-16 MED ORDER — MIDAZOLAM HCL 5 MG/5ML IJ SOLN
INTRAMUSCULAR | Status: AC
  Filled 2016-06-16: qty 10

## 2016-06-16 MED ORDER — MEPERIDINE HCL 50 MG/ML IJ SOLN
INTRAMUSCULAR | Status: DC | PRN
  Administered 2016-06-16 (×2): 25 mg

## 2016-06-16 MED ORDER — ATORVASTATIN CALCIUM 20 MG PO TABS: 20.0000 mg | ORAL_TABLET | Freq: Every day | ORAL | 1 refills | Status: AC

## 2016-06-16 MED ORDER — SODIUM CHLORIDE 0.9 % IV SOLN
INTRAVENOUS | Status: DC
  Administered 2016-06-16: 1000 mL via INTRAVENOUS

## 2016-06-16 MED ORDER — MIDAZOLAM HCL 5 MG/5ML IJ SOLN
INTRAMUSCULAR | Status: DC | PRN
  Administered 2016-06-16 (×4): 2 mg via INTRAVENOUS

## 2016-06-16 NOTE — Discharge Instructions (Signed)
Resume Eliquis on the evening of 06/18/2016. Do not take Celebrex while on Eliquis. Resume other medications and diet as before. No driving for 24 hours.  Physician will call with biopsy results.   Colonoscopy, Care After Refer to this sheet in the next few weeks. These instructions provide you with information on caring for yourself after your procedure. Your health care provider may also give you more specific instructions. Your treatment has been planned according to current medical practices, but problems sometimes occur. Call your health care provider if you have any problems or questions after your procedure. WHAT TO EXPECT AFTER THE PROCEDURE  After your procedure, it is typical to have the following:  A small amount of blood in your stool.  Moderate amounts of gas and mild abdominal cramping or bloating. HOME CARE INSTRUCTIONS  Do not drive, operate machinery, or sign important documents for 24 hours.  You may shower and resume your regular physical activities, but move at a slower pace for the first 24 hours.  Take frequent rest periods for the first 24 hours.  Walk around or put a warm pack on your abdomen to help reduce abdominal cramping and bloating.  Drink enough fluids to keep your urine clear or pale yellow.  You may resume your normal diet as instructed by your health care provider. Avoid heavy or fried foods that are hard to digest.  Avoid drinking alcohol for 24 hours or as instructed by your health care provider.  Only take over-the-counter or prescription medicines as directed by your health care provider.  If a tissue sample (biopsy) was taken during your procedure:  Do not take aspirin or blood thinners for 7 days, or as instructed by your health care provider.  Do not drink alcohol for 7 days, or as instructed by your health care provider.  Eat soft foods for the first 24 hours. SEEK MEDICAL CARE IF: You have persistent spotting of blood in your stool  2-3 days after the procedure. SEEK IMMEDIATE MEDICAL CARE IF:  You have more than a small spotting of blood in your stool.  You pass large blood clots in your stool.  Your abdomen is swollen (distended).  You have nausea or vomiting.  You have a fever.  You have increasing abdominal pain that is not relieved with medicine.   This information is not intended to replace advice given to you by your health care provider. Make sure you discuss any questions you have with your health care provider.   Document Released: 03/15/2004 Document Revised: 05/22/2013 Document Reviewed: 04/08/2013 Elsevier Interactive Patient Education [employer].

## 2016-06-16 NOTE — H&P (Signed)
[name] is an 70 y.o. male.   Chief Complaint: Patient is here for colonoscopy. HPI: 70 year old Caucasian male with multiple medical problems was anticoagulated and now presents for intermittent rectal bleeding which occurs with bowel movements and occasionally passes small amount of blood. He denies abdominal pain diarrhea constipation. Family History is negative for CRC. Last colonoscopy was 10 years ago.  Past Medical History:  Diagnosis Date    06/07/2013      . Chronic anticoagulation 05/2013   started  . Chronic kidney disease    Patient reports he is seeing Kidney doctor in September  . Coronary artery disease   . Diabetes mellitus    type 2   . Dyslipidemia   . GERD (gastroesophageal reflux disease)   . History of valve replacement 2010   [facility] Ease bioprosthetic 20mm  . Hypertension   . PAF (paroxysmal atrial fibrillation) ([facility]) 05/2013   converted with amiodarone to SR  . S/P CABG x 3 2010  . S/P cardiac catheterization, Rt & Lt heart cath 06/05/2013 06/07/2013  . Stroke [facility] 2003   R-sided weakness & upper extremity swelling   . Thyroid disease     Past Surgical History:  Procedure Laterality Date  . AORTIC VALVE REPLACEMENT  01/26/2009   [facility] Ease bioprosthetic 33mm valve  . CARDIAC CATHETERIZATION  06/05/2013   native 3 vessel disease; patent LIMA to LAD, SVG to OM and SVG to PDA; Elevated right and left heart filling pressures, with predominantly pulmonary venous hypertension, normal PVR  . COLONOSCOPY  2007   Dr. Aviva Signs: internal hemorrhoids  . CORONARY ARTERY BYPASS GRAFT  01/26/2009   LIMA to LAD, SVG to circumflex, SVG to PDA  . GRAFT(S) ANGIOGRAM  06/05/2013   Procedure: GRAFT(S) Cyril Loosen;  Surgeon: Leonie Man, MD;  Location: [facility] CATH LAB;  Service: Cardiovascular;;  . LEFT AND RIGHT HEART CATHETERIZATION WITH CORONARY ANGIOGRAM N/A 06/05/2013   Procedure: LEFT AND RIGHT HEART CATHETERIZATION WITH CORONARY ANGIOGRAM;   Surgeon: Leonie Man, MD;  Location: [facility] CATH LAB;  Service: Cardiovascular;  Laterality: N/A;  . TRANSTHORACIC ECHOCARDIOGRAM  09/2011   grade 1 diastolic dysfunction; increasing valve gradient; calcified MV annulus   . TRANSTHORACIC ECHOCARDIOGRAM  06/03/2013   EF 25-30%, mod conc. hypertrophy, grade 3 diastolic dysfunction; LA mod dilated; calcified MV annulus; transaortic gradients are normal for the bioprosthetic valve; inf vena cava dilated (elevated CVP) - LifeVest    Family History  Problem Relation Age of Onset  . Heart attack Mother   . Liver disease Brother   . Diabetes Sister     x4   Social History:  reports that he quit smoking about 14 years ago. His smoking use included Cigarettes. He has quit using smokeless tobacco. His smokeless tobacco use included Chew. He reports that he does not drink alcohol or use drugs.  Allergies: No Known Allergies  Medications Prior to Admission  Medication Sig Dispense Refill  . acetaminophen (TYLENOL) 500 MG tablet Take 1,000 mg by mouth 2 (two) times daily as needed for headache. For pain/headaches    . ALPRAZolam (XANAX) 1 MG tablet Take 1 mg by mouth 3 (three) times daily.     Marland Kitchen amiodarone (PACERONE) 200 MG tablet TAKE ONE TABLET EACH DAY. 120 tablet 3  . atorvastatin (LIPITOR) 20 MG tablet Take 1 tablet (20 mg total) by mouth daily at 6 PM. 90 tablet 1  . celecoxib (CELEBREX) 200 MG capsule Take 200 mg by mouth  2 (two) times daily.    Marland Kitchen docusate sodium (COLACE) 100 MG capsule Take 100 mg by mouth 2 (two) times daily.    Marland Kitchen ELIQUIS 5 MG TABS tablet TAKE ONE TABLET TWICE DAILY 180 tablet 0  . fenofibrate (TRICOR) 145 MG tablet TAKE ONE (1) TABLET BY MOUTH EVERY DAY 30 tablet 5  . ferrous sulfate 325 (65 FE) MG EC tablet Take 325 mg by mouth daily with breakfast.    . gabapentin (NEURONTIN) 300 MG capsule Take 300 mg by mouth 3 (three) times daily.    . insulin glargine (LANTUS) 100 UNIT/ML injection Inject 0.6-0.75 mLs (60-75 Units  total) into the skin 2 (two) times daily. 75 units in the morning then 60 units at bedtime    . insulin lispro (HUMALOG) 100 UNIT/ML injection Inject 0.35-0.5 mLs (35-50 Units total) into the skin 3 (three) times daily before meals. Patient states that he takes 35 units at breakfast, 50 units at dinner and 50 units at supper.    . levothyroxine (SYNTHROID, LEVOTHROID) 25 MCG tablet Take 25 mcg by mouth daily before breakfast.    . meclizine (ANTIVERT) 25 MG tablet Take 25 mg by mouth 3 (three) times daily as needed for dizziness.    . metoprolol succinate (TOPROL-XL) 25 MG 24 hr tablet TAKE ONE (1) TABLET EACH DAY 30 tablet 8  . Omega-3 Fatty Acids (FISH OIL) 1200 MG CAPS Take by mouth.    . pantoprazole (PROTONIX) 40 MG tablet Take 1 tablet (40 mg total) by mouth 2 (two) times daily. 180 tablet 1  . torsemide (DEMADEX) 10 MG tablet Take 1 tablet (10 mg total) by mouth daily. 30 tablet 11    Results for orders placed or performed during the hospital encounter of 06/16/16 (from the past 48 hour(s))  Glucose, capillary     Status: Abnormal   Collection Time: 06/16/16  1:10 PM  Result Value Ref Range   Glucose-Capillary 211 (H) 65 - 99 mg/dL   No results found.  ROS  Blood pressure (!) 169/67, pulse 70, temperature 97.6 F (36.4 C), temperature source Oral, resp. rate 17, height 5\' 10"  (1.778 m), weight 253 lb (114.8 kg), SpO2 96 %. Physical Exam  Constitutional: He appears well-developed and well-nourished.  HENT:  Mouth/Throat: Oropharynx is clear and moist.  Eyes: Conjunctivae are normal.  Neck: No thyromegaly present.  Cardiovascular:  Regular rhythm normal S1 and S2. Grade 2/6 systolic ejection murmur heard all over the precordium.  Respiratory: Effort normal and breath sounds normal.  GI:  Abdomen is full but soft and nontender without organomegaly or masses.  Musculoskeletal: He exhibits edema (trace edema around ankles.).  Lymphadenopathy:    He has no cervical adenopathy.   Neurological: He is alert.  Skin: Skin is warm and dry.     Assessment/Plan Hematochezia. Diagnostic colonoscopy.  Hildred Laser, MD 06/16/2016, 1:26 PM

## 2016-06-16 NOTE — Op Note (Signed)
[facility] Patient Name: [name] Procedure Date: 06/16/2016 1:26 PM MRN: [id number] Date of Birth: 11-24-1945 Attending MD: Hildred Laser , MD CSN: [id number] Age: 70 Admit Type: Outpatient Procedure:                Colonoscopy Indications:              Hematochezia. Please note patient did not stop                            Eliquis. Providers:                Hildred Laser, MD, Janeece Riggers, RN, Randa Spike,                            Technician Referring MD:             Redmond School, MD Medicines:                Meperidine 50 mg IV, Midazolam 8 mg IV Complications:            No immediate complications. Estimated Blood Loss:     Estimated blood loss: none. Procedure:                Pre-Anesthesia Assessment:                           - Prior to the procedure, a History and Physical                            was performed, and patient medications and                            allergies were reviewed. The patient's tolerance of                            previous anesthesia was also reviewed. The risks                            and benefits of the procedure and the sedation                            options and risks were discussed with the patient.                            All questions were answered, and informed consent                            was obtained. Prior Anticoagulants: The patient                            last took previous NSAID medication 1 day prior to                            the procedure and last took Eliquis (apixaban) on  the day of the procedure. ASA Grade Assessment: III                            - A patient with severe systemic disease. After                            reviewing the risks and benefits, the patient was                            deemed in satisfactory condition to undergo the                            procedure.                           After obtaining informed consent, the colonoscope                             was passed under direct vision. Throughout the                            procedure, the patient's blood pressure, pulse, and                            oxygen saturations were monitored continuously. The                            [id number] scope was introduced through                            the anus and advanced to the the cecum, identified                            by appendiceal orifice and ileocecal valve. The                            colonoscopy was performed without difficulty. The                            patient tolerated the procedure well. The quality                            of the bowel preparation was adequate to identify                            polyps 6 mm and larger in size. The ileocecal                            valve, appendiceal orifice, and rectum were                            photographed. Scope In: 1:37:09 PM Scope Out: 1:58:37 PM Scope Withdrawal Time: 0 hours 16 minutes 23 seconds  Total Procedure Duration: 0 hours  21 minutes 28 seconds  Findings:      The perianal and digital rectal examinations were normal.      A 12 mm polyp was found in the sigmoid colon. The polyp was       pedunculated. To prevent two hemostatic clips were successfully placed;1       before and one after polypectomy(MR conditional). There was no bleeding       during, or at the end, of the procedure.      External hemorrhoids were found during retroflexion. The hemorrhoids       were small. Impression:               - One 12 mm polyp in the sigmoid colon. Clips (MR                            conditional) were placed(one before and one after                            polypectomy)                           - External hemorrhoids.                           - No specimens collected. Moderate Sedation:      Moderate (conscious) sedation was administered by the endoscopy nurse       and supervised by the endoscopist. The following  parameters were       monitored: oxygen saturation, heart rate, blood pressure, CO2       capnography and response to care. Total physician intraservice time was       25 minutes. Recommendation:           - Patient has a contact number available for                            emergencies. The signs and symptoms of potential                            delayed complications were discussed with the                            patient. Return to normal activities tomorrow.                            Written discharge instructions were provided to the                            patient.                           - Resume previous diet today.                           - Discontinue Celebrex.                           - Await pathology results.                           -  Resume Eliquis (apixaban) at prior dose in 2 days.                           - Repeat colonoscopy date to be determined after                            pending pathology results are reviewed for                            surveillance. Procedure Code(s):        --- Professional ---                           [phone], Colonoscopy, flexible; diagnostic, including                            collection of specimen(s) by brushing or washing,                            when performed (separate procedure)                           99152, Moderate sedation services provided by the                            same physician or other qualified health care                            professional performing the diagnostic or                            therapeutic service that the sedation supports,                            requiring the presence of an independent trained                            observer to assist in the monitoring of the                            patient's level of consciousness and physiological                            status; initial 15 minutes of intraservice time,                            patient age 32 years  or older                           [phone], Moderate sedation services; each additional                            15 minutes intraservice time Diagnosis Code(s):        --- Professional ---  D12.5, Benign neoplasm of sigmoid colon                           K64.4, Residual hemorrhoidal skin tags                           K92.1, Melena (includes Hematochezia) CPT copyright 2016 American Medical Association. All rights reserved. The codes documented in this report are preliminary and upon coder review may  be revised to meet current compliance requirements. Hildred Laser, MD Hildred Laser, MD 06/16/2016 2:11:47 PM This report has been signed electronically. Number of Addenda: 0

## 2016-06-17 ENCOUNTER — Ambulatory Visit: Payer: Self-pay | Admitting: "Endocrinology

## 2016-06-20 ENCOUNTER — Encounter (HOSPITAL_COMMUNITY): Payer: Self-pay | Admitting: Internal Medicine

## 2016-06-21 ENCOUNTER — Ambulatory Visit (HOSPITAL_COMMUNITY): Payer: Medicare Other

## 2016-06-23 ENCOUNTER — Ambulatory Visit (HOSPITAL_COMMUNITY): Payer: Medicare Other | Admitting: Physical Therapy

## 2016-06-24 ENCOUNTER — Telehealth (HOSPITAL_COMMUNITY): Payer: Self-pay

## 2016-06-24 ENCOUNTER — Ambulatory Visit (HOSPITAL_COMMUNITY): Payer: Medicare Other | Attending: Orthopaedic Surgery

## 2016-06-24 DIAGNOSIS — S90931S Unspecified superficial injury of right great toe, sequela: Secondary | ICD-10-CM | POA: Insufficient documentation

## 2016-06-24 DIAGNOSIS — R262 Difficulty in walking, not elsewhere classified: Secondary | ICD-10-CM | POA: Insufficient documentation

## 2016-06-24 DIAGNOSIS — R2681 Unsteadiness on feet: Secondary | ICD-10-CM | POA: Insufficient documentation

## 2016-06-24 DIAGNOSIS — L97513 Non-pressure chronic ulcer of other part of right foot with necrosis of muscle: Secondary | ICD-10-CM | POA: Insufficient documentation

## 2016-06-24 NOTE — Telephone Encounter (Signed)
No show, called and left message.  Therapist aware that pt. has plans to be in [place] next week, believe pt. forgot to cancel this apt.    [address] [place]; [facility] [phone]

## 2016-06-27 ENCOUNTER — Encounter (INDEPENDENT_AMBULATORY_CARE_PROVIDER_SITE_OTHER): Payer: Self-pay | Admitting: *Deleted

## 2016-06-27 ENCOUNTER — Ambulatory Visit (HOSPITAL_COMMUNITY): Payer: Medicare Other | Admitting: Physical Therapy

## 2016-06-27 DIAGNOSIS — S90931S Unspecified superficial injury of right great toe, sequela: Secondary | ICD-10-CM

## 2016-06-27 DIAGNOSIS — R262 Difficulty in walking, not elsewhere classified: Secondary | ICD-10-CM

## 2016-06-27 DIAGNOSIS — L97513 Non-pressure chronic ulcer of other part of right foot with necrosis of muscle: Secondary | ICD-10-CM | POA: Diagnosis not present

## 2016-06-27 DIAGNOSIS — R2681 Unsteadiness on feet: Secondary | ICD-10-CM | POA: Diagnosis not present

## 2016-06-27 NOTE — Therapy (Signed)
[place], [name], [address] Phone: [phone]   Fax:  [phone]  Wound Care Therapy  Patient Details  Name: [name] MRN: [id number] Date of Birth: [birth year] No Data Recorded  Encounter Date: 06/27/2016      PT End of Session - 06/27/16 1626    Visit Number 11   Number of Visits 20   Date for PT Re-Evaluation 07/12/16   Authorization Type [employer] Medicare (need to charge KX moving forward)   Authorization Time Period 05/09/16 to 07/09/16   Authorization - Visit Number 11   Authorization - Number of Visits 20   PT Start Time 1602   PT Stop Time 1621  simple wound    PT Time Calculation (min) 19 min   Activity Tolerance Patient tolerated treatment well   Behavior During Therapy [facility] for tasks assessed/performed      Past Medical History:  Diagnosis Date  . At risk for sudden cardiac death 07-07-13  . Cataract   . Chronic anticoagulation 05/2013   started  . Chronic kidney disease    Patient reports he is seeing Kidney doctor in September  . Coronary artery disease   . Diabetes mellitus    type 2   . Dyslipidemia   . GERD (gastroesophageal reflux disease)   . History of valve replacement 2010   [facility] Ease bioprosthetic 90m  . Hypertension   . [place]oxysmal [place]) [place] 05[place]   converted with amiodarone to SR  . S/P CABG x 3 2010  . S/P cardiac catheterization, Rt & Lt heart cath 06/05/2013 1Nov 23, 2014 . [facility]roke [facility] 2003   R-sided weakness & upper extremity swelling   . Thyroid disease     Past Surgical History:  Procedure Laterality Date  . [facility]  [facility]  . CARDIAC CATHETERIZATION  06/05/2013   native 3 vessel disease; patent LIMA to LAD, SVG to OM and SVG to PDA; Elevated right and left heart filling pressures, with predominantly pulmonary venous hypertension, normal PVR  . COLONOSCOPY   2007   Dr. MaAviva Signsinternal hemorrhoids  . COLONOSCOPY N/A 06/16/2016   Procedure: COLONOSCOPY;  Surgeon: NaRogene HoustonMD;  Location: [facility] ENDO SUITE;  Service: Endoscopy;  Laterality: N/A;  1:45  . CORONARY ARTERY BYPASS GRAFT  01/26/2009   LIMA to LAD, SVG to circumflex, SVG to PDA  . GRAFT(S) ANGIOGRAM  06/05/2013   Procedure: GRAFT(S) ANCyril Loosen Surgeon: DaLeonie ManMD;  [facility];  Service: Cardiovascular;;  . LEFT AND RIGHT HEART CATHETERIZATION WITH CORONARY ANGIOGRAM N/A 06/05/2013   Procedure: LEFT AND RIGHT HEART CATHETERIZATION WITH CORONARY ANGIOGRAM;  Surgeon: DaLeonie ManMD;  [facility];  Service: Cardiovascular;  Laterality: N/A;  . TRANSTHORACIC ECHOCARDIOGRAM  09/2011   grade 1 diastolic dysfunction; increasing valve gradient; calcified MV annulus   . TRANSTHORACIC ECHOCARDIOGRAM  06/03/2013   EF 25-30%, mod conc. hypertrophy, grade 3 diastolic dysfunction; LA mod dilated; calcified MV annulus; transaortic gradients are normal for the bioprosthetic valve; inf vena cava dilated (elevated CVP) - LifeVest    There were no vitals filed for this visit.                  Wound Therapy - 06/27/16 1623    Subjective Patient arrives today stating he is doing well, he did have to change the dressing once while he  was gone but it feels good    Patient and Family Stated Goals wound to heal    Date of Onset 02/18/13   Prior Treatments has received wound PT at this clinic multiple times    Pain Assessment No/denies pain   Evaluation and Treatment Procedures Explained to Patient/Family Yes   Evaluation and Treatment Procedures agreed to   Wound Properties Date First Assessed: 05/09/16 Time First Assessed: 1206 Wound Type: Other (Comment) , R great toe wound   Location: Other (Comment) , R great toe   Location Orientation: Right Wound Description (Comments): R great toe  Present on Admission: Yes   Dressing Type Gauze (Comment)    Dressing Changed Changed   Dressing Status None   Dressing Change Frequency PRN   % Wound base Red or Granulating 100%   % Wound base Yellow/Fibrinous Exudate 0%   Closure None   Drainage Amount Scant   Drainage Description Serosanguineous   Treatment Cleansed;Packing (Impregnated strip);Tape changed;Other (Comment)  cleansed, xeroform, gauze, tape, netting    Wound Properties Date First Assessed: 02/23/16 Wound Type: Other (Comment) , open area R great toe   Location: Other (Comment) , Medial R great toe   Location Orientation: Right;Medial Present on Admission: Yes   Incision Properties Date First Assessed: 04/23/15 Time First Assessed: 1738 Location: Groin Location Orientation: Left Present on Admission: No   Wound Therapy - Clinical Statement Patient arrives doing quite well today, reports he had to change his dressing once on his trip but it is feeling good. Wound appears to contniue to be well healing at this time- continued to dress with xeroform, gauze and tape. Recommend that patient continue to come to skilled PT services until wound is fully resolved.    Wound Therapy - Functional Problem List dificulty walking, dificulty wearing shoes    Factors Delaying/Impairing Wound Healing Altered sensation;Diabetes Mellitus;Infection - systemic/local;Immobility;Multiple medical problems;Vascular compromise   Hydrotherapy Plan Debridement;Dressing change;Patient/family education   Wound Therapy - Frequency Other (comment)  1-2 times per week    Wound Therapy - Current Recommendations PT   Wound Plan Continue with skilled woundcare with dressing changes as appropriate.                  PT Education - 06/27/16 1626    Education provided Yes   Education Details wound progress, POC    Person(s) Educated Patient   Methods Explanation   Comprehension Verbalized understanding          PT Short Term Goals - 06/14/16 0857      PT SHORT TERM GOAL #1   Title Patient to demonstrate  reduction of wound area by at least 50% on all measured dimensions in order to demonstrate improvement of condition    Time 4   Period Weeks   Status Achieved     PT SHORT TERM GOAL #2   Title Patient will verbally be able to verbally state at least 3 methods of foot care/ways to prevent further wound formation in order to prevent wound recurrence    Time 4   Period Weeks   Status Partially Met           PT Long Term Goals - 06/14/16 0254      PT LONG TERM GOAL #1   Title Patient to be able to wear all pairs of shoes without exacerbation of R great toe in order to improve QOL and recurrence of wound    Time 8   Period Weeks  Status Unable to assess     PT LONG TERM GOAL #2   Title Patient to display full wound closure in order to demonstrate resolution of condition    Time 8   Period Weeks   Status On-going     PT LONG TERM GOAL #3   Title Patient to be able to verbally state the importance of maintaining blood sugars in relation to proper wound healing and general health status    Time 8   Period Weeks   Status On-going             Patient will benefit from skilled therapeutic intervention in order to improve the following deficits and impairments:     Visit Diagnosis: Unspecified superficial injury of right great toe, sequela  Difficulty in walking, not elsewhere classified     Problem List Patient Active Problem List   Diagnosis Date Noted  . Nausea & vomiting 02/24/2016  . A-fib ([place]) 02/23/2016  . Hypothyroidism 02/23/2016  . CKD (chronic kidney disease), stage III 02/23/2016  . Anxiety 02/23/2016  . Chronic combined systolic (congestive) and diastolic (congestive) heart failure 02/23/2016  . Dizziness 02/23/2016  . Open wound of right foot 02/23/2016  . Rectal bleeding 07/28/2015  . Chronic anticoagulation 07/28/2015  . PVD (peripheral vascular disease) ([place])   . S/P angioplasty 04/23/2015  . Peripheral arterial occlusive disease ([place])  04/23/2015  . PAD (peripheral artery disease) ([place])   . Critical lower limb ischemia   . Toe ulcer, right ([place])   . Type II or unspecified type diabetes mellitus without mention of complication, uncontrolled 11/27/2013  . Acute respiratory failure with hypoxia ([place]) 11/26/2013  . CAP (community acquired pneumonia) 11/26/2013  . CHF exacerbation ([place]) 11/25/2013  . Cardiomyopathy, ischemic 06/13/2013  . At risk for sudden cardiac death 06/16/2013  . S/P cardiac catheterization, Rt & Lt heart cath 06/05/2013 2013/06/16  . Acute on chronic combined systolic and diastolic HF (heart failure), NYHA class 3 ([place]) 06/04/2013  . Atrial flutter with rapid ventricular response ([place]) 06/02/2013  . Acute renal failure: Cr up to 1.8 06/02/2013  . Hyperkalemia 06/02/2013    Class: Acute  . LBBB (left bundle branch block) 06/01/2013  . Hypotension- secondary to AF and Diltiazem Rx 06/01/2013  . Obesity (BMI 30-39.9) 06/01/2013  . Acute on chronic diastolic heart failure - due to Afib AVR 06/01/2013  . Atrial fibrillation with RVR- converted with Amiodarone 05/31/2013  . Stroke- Lt brain 2003 04/08/2013  . Hemiplegia affecting right dominant side ([place]) 04/08/2013  . Cataract 04/08/2013  . GERD (gastroesophageal reflux disease) 04/08/2013  . S/P CABG x 3 - 2010 04/08/2013  . S/P AVR-tissue, 2010 04/08/2013  . DM2 (diabetes mellitus, type 2) ([place]) 04/08/2013  . Dyslipidemia 04/08/2013  . SUBACROMIAL BURSITIS, LEFT 11/05/2009    Deniece Ree PT, DPT [facility] [address] [place], [name], [address] Phone: [phone]   Fax:  [phone]  Name: [name] MRN: [id number] Date of Birth: [birth year]

## 2016-06-29 ENCOUNTER — Ambulatory Visit (HOSPITAL_COMMUNITY): Payer: Medicare Other | Admitting: Physical Therapy

## 2016-06-29 DIAGNOSIS — L97513 Non-pressure chronic ulcer of other part of right foot with necrosis of muscle: Secondary | ICD-10-CM | POA: Diagnosis not present

## 2016-06-29 DIAGNOSIS — S90931S Unspecified superficial injury of right great toe, sequela: Secondary | ICD-10-CM | POA: Diagnosis not present

## 2016-06-29 DIAGNOSIS — R2681 Unsteadiness on feet: Secondary | ICD-10-CM

## 2016-06-29 DIAGNOSIS — R262 Difficulty in walking, not elsewhere classified: Secondary | ICD-10-CM

## 2016-06-29 NOTE — Therapy (Signed)
[place], [name], [address] Phone: [phone]   Fax:  [phone]  Wound Care Therapy  Patient Details  Name: [name] MRN: [id number] Date of Birth: [birth year] No Data Recorded  Encounter Date: 06/29/2016      PT End of Session - 06/29/16 1201    Visit Number 12   Number of Visits 20   Date for PT Re-Evaluation 07/12/16   Authorization Type UHC Medicare (need to charge KX moving forward)   Authorization Time Period 05/09/16 to 07/09/16   Authorization - Visit Number 12   Authorization - Number of Visits 20   PT Start Time 1120   PT Stop Time 1155   PT Time Calculation (min) 35 min   Activity Tolerance Patient tolerated treatment well   Behavior During Therapy [facility] for tasks assessed/performed      Past Medical History:  Diagnosis Date  . At risk for sudden cardiac death 07/05/2013  . Cataract   . Chronic anticoagulation 05/2013   started  . Chronic kidney disease    Patient reports he is seeing Kidney doctor in September  . Coronary artery disease   . Diabetes mellitus    type 2   . Dyslipidemia   . GERD (gastroesophageal reflux disease)   . History of valve replacement 2010   [facility] Ease bioprosthetic 60m  . Hypertension   . PAF (paroxysmal atrial fibrillation) ([facility] 05/2013   converted with amiodarone to SR  . S/P CABG x 3 2010  . S/P cardiac catheterization, Rt & Lt heart cath 06/05/2013 12014-11-21 . Stroke ([facility] 2003   R-sided weakness & upper extremity swelling   . Thyroid disease     Past Surgical History:  Procedure Laterality Date  . [facility]  [facility]  . CARDIAC CATHETERIZATION  06/05/2013   native 3 vessel disease; patent LIMA to LAD, SVG to OM and SVG to PDA; Elevated right and left heart filling pressures, with predominantly pulmonary venous hypertension, normal PVR  . COLONOSCOPY  2007   Dr.  MaAviva Signsinternal hemorrhoids  . COLONOSCOPY N/A 06/16/2016   Procedure: COLONOSCOPY;  Surgeon: NaRogene HoustonMD;  Location: [facility]O [facility]ITE;  Service: Endoscopy;  Laterality: N/A;  1:45  . CORONARY ARTERY BYPASS GRAFT  01/26/2009   LIMA to LAD, SVG to circumflex, SVG to PDA  . GRAFT(S) ANGIOGRAM  06/05/2013   Procedure: GRAFT(S) ANCyril Loosen Surgeon: DaLeonie ManMD;  [facility];  Service: Cardiovascular;;  . LEFT AND RIGHT HEART CATHETERIZATION WITH CORONARY ANGIOGRAM N/A 06/05/2013   Procedure: LEFT AND RIGHT HEART CATHETERIZATION WITH CORONARY ANGIOGRAM;  Surgeon: DaLeonie ManMD;  [facility];  Service: Cardiovascular;  Laterality: N/A;  . TRANSTHORACIC ECHOCARDIOGRAM  09/2011   grade 1 diastolic dysfunction; increasing valve gradient; calcified MV annulus   . TRANSTHORACIC ECHOCARDIOGRAM  06/03/2013   EF 25-30%, mod conc. hypertrophy, grade 3 diastolic dysfunction; LA mod dilated; calcified MV annulus; transaortic gradients are normal for the bioprosthetic valve; inf vena cava dilated (elevated CVP) - LifeVest    There were no vitals filed for this visit.                  Wound Therapy - 06/29/16 1156    Subjective Patient has no complaint.   Patient and Family Stated Goals wound to heal    Date of Onset 02/18/13  Prior Treatments has received wound PT at this clinic multiple times    Pain Assessment No/denies pain   Evaluation and Treatment Procedures Explained to Patient/Family Yes   Evaluation and Treatment Procedures agreed to   Wound Properties Date First Assessed: 05/09/16 Time First Assessed: 1206 Wound Type: Other (Comment) , R great toe wound   Location: Other (Comment) , R great toe   Location Orientation: Right Wound Description (Comments): R great toe  Present on Admission: Yes   Dressing Type Gauze (Comment);Impregnated gauze (bismuth)   Dressing Changed Changed   Dressing Status None   Dressing Change Frequency PRN   %  Wound base Red or Granulating 100%   % Wound base Yellow/Fibrinous Exudate 0%   Closure None   Drainage Amount Scant   Drainage Description Serosanguineous   Treatment Cleansed;Debridement (Selective)   Wound Properties Date First Assessed: 02/23/16 Wound Type: Other (Comment) , open area R great toe   Location: Other (Comment) , Medial R great toe   Location Orientation: Right;Medial Present on Admission: Yes   Incision Properties Date First Assessed: 04/23/15 Time First Assessed: 1738 Location: Groin Location Orientation: Left Present on Admission: No   Selective Debridement - Location periphery of wound    Selective Debridement - Tools Used Forceps;Scissors   Selective Debridement - Tissue Removed callous    Wound Therapy - Clinical Statement Significant amount of callous removed from around the wound.  Wound bed is 100% granulated.  Pt may be ready for discharge next session.    Wound Therapy - Functional Problem List dificulty walking, dificulty wearing shoes    Factors Delaying/Impairing Wound Healing Altered sensation;Diabetes Mellitus;Infection - systemic/local;Immobility;Multiple medical problems;Vascular compromise   Hydrotherapy Plan Debridement;Dressing change;Patient/family education   Wound Therapy - Frequency Other (comment)  1-2 times per week    Wound Therapy - Current Recommendations PT   Wound Plan Continue with skilled woundcare with dressing changes as appropriate.                    PT Short Term Goals - 06/14/16 0857      PT SHORT TERM GOAL #1   Title Patient to demonstrate reduction of wound area by at least 50% on all measured dimensions in order to demonstrate improvement of condition    Time 4   Period Weeks   Status Achieved     PT SHORT TERM GOAL #2   Title Patient will verbally be able to verbally state at least 3 methods of foot care/ways to prevent further wound formation in order to prevent wound recurrence    Time 4   Period Weeks    Status Partially Met           PT Long Term Goals - 06/14/16 1962      PT LONG TERM GOAL #1   Title Patient to be able to wear all pairs of shoes without exacerbation of R great toe in order to improve QOL and recurrence of wound    Time 8   Period Weeks   Status Unable to assess     PT LONG TERM GOAL #2   Title Patient to display full wound closure in order to demonstrate resolution of condition    Time 8   Period Weeks   Status On-going     PT LONG TERM GOAL #3   Title Patient to be able to verbally state the importance of maintaining blood sugars in relation to proper wound healing and general health status  Time 8   Period Weeks   Status On-going               Plan - 06/29/16 1201    Clinical Impression Statement see above    Rehab Potential Good   PT Frequency 1x / week   PT Duration 4 weeks   PT Treatment/Interventions Other (comment);Patient/family education   PT Next Visit Plan measure wound       Patient will benefit from skilled therapeutic intervention in order to improve the following deficits and impairments:  Impaired sensation, Other (comment), Difficulty walking  Visit Diagnosis: Unspecified superficial injury of right great toe, sequela  Difficulty in walking, not elsewhere classified  Unsteadiness on feet     Problem List Patient Active Problem List   Diagnosis Date Noted  . Nausea & vomiting 02/24/2016  . A-fib ([place]) 02/23/2016  . Hypothyroidism 02/23/2016  . CKD (chronic kidney disease), stage III 02/23/2016  . Anxiety 02/23/2016  . Chronic combined systolic (congestive) and diastolic (congestive) heart failure 02/23/2016  . Dizziness 02/23/2016  . Open wound of right foot 02/23/2016  . Rectal bleeding 07/28/2015  . Chronic anticoagulation 07/28/2015  . PVD (peripheral vascular disease) ([place])   . S/P angioplasty 04/23/2015  . Peripheral arterial occlusive disease ([place]) 04/23/2015  . PAD (peripheral artery disease) ([place])    . Critical lower limb ischemia   . Toe ulcer, right ([place])   . Type II or unspecified type diabetes mellitus without mention of complication, uncontrolled 11/27/2013  . Acute respiratory failure with hypoxia ([place]) 11/26/2013  . CAP (community acquired pneumonia) 11/26/2013  . CHF exacerbation ([place]) 11/25/2013  . Cardiomyopathy, ischemic 06/13/2013  . At risk for sudden cardiac death June 14, 2013  . S/P cardiac catheterization, Rt & Lt heart cath 06/05/2013 2013/06/14  . Acute on chronic combined systolic and diastolic HF (heart failure), NYHA class 3 ([place]) 06/04/2013  . Atrial flutter with rapid ventricular response ([place]) 06/02/2013  . Acute renal failure: Cr up to 1.8 06/02/2013  . Hyperkalemia 06/02/2013    Class: Acute  . LBBB (left bundle branch block) 06/01/2013  . Hypotension- secondary to AF and Diltiazem Rx 06/01/2013  . Obesity (BMI 30-39.9) 06/01/2013  . Acute on chronic diastolic heart failure - due to Afib AVR 06/01/2013  . Atrial fibrillation with RVR- converted with Amiodarone 05/31/2013  . Stroke- Lt brain 2003 04/08/2013  . Hemiplegia affecting right dominant side ([place]) 04/08/2013  . Cataract 04/08/2013  . GERD (gastroesophageal reflux disease) 04/08/2013  . S/P CABG x 3 - 2010 04/08/2013  . S/P AVR-tissue, 2010 04/08/2013  . DM2 (diabetes mellitus, type 2) ([place]) 04/08/2013  . Dyslipidemia 04/08/2013  . SUBACROMIAL BURSITIS, LEFT 11/05/2009   Rayetta Humphrey, PT CLT [phone] 06/29/2016, 12:03 PM  [place] [address] [place], [name], [address] Phone: [phone]   Fax:  [phone]  Name: [name] MRN: [id number] Date of Birth: 1946/04/19

## 2016-07-04 ENCOUNTER — Other Ambulatory Visit (INDEPENDENT_AMBULATORY_CARE_PROVIDER_SITE_OTHER): Payer: Self-pay | Admitting: *Deleted

## 2016-07-04 ENCOUNTER — Encounter (INDEPENDENT_AMBULATORY_CARE_PROVIDER_SITE_OTHER): Payer: Self-pay | Admitting: *Deleted

## 2016-07-04 DIAGNOSIS — R748 Abnormal levels of other serum enzymes: Secondary | ICD-10-CM

## 2016-07-05 ENCOUNTER — Ambulatory Visit (HOSPITAL_COMMUNITY): Payer: Medicare Other | Admitting: Physical Therapy

## 2016-07-06 ENCOUNTER — Ambulatory Visit (HOSPITAL_COMMUNITY): Payer: Medicare Other | Admitting: Physical Therapy

## 2016-07-06 DIAGNOSIS — S90931S Unspecified superficial injury of right great toe, sequela: Secondary | ICD-10-CM

## 2016-07-06 DIAGNOSIS — R262 Difficulty in walking, not elsewhere classified: Secondary | ICD-10-CM

## 2016-07-06 DIAGNOSIS — R2681 Unsteadiness on feet: Secondary | ICD-10-CM | POA: Diagnosis not present

## 2016-07-06 DIAGNOSIS — L97513 Non-pressure chronic ulcer of other part of right foot with necrosis of muscle: Secondary | ICD-10-CM

## 2016-07-06 NOTE — Therapy (Signed)
[name], [name], [address] Phone: [phone]   Fax:  [phone]  Wound Care Therapy  Patient Details  Name: [name] MRN: [id number] Date of Birth: 10-07-1945 No Data Recorded  Encounter Date: 07/06/2016      PT End of Session - 07/06/16 1127    Visit Number 13   Number of Visits 20   Date for PT Re-Evaluation 07/12/16   Authorization Type UHC Medicare (need to charge KX moving forward)   Authorization Time Period 05/09/16 to 07/09/16   Authorization - Visit Number 75   Authorization - Number of Visits 20   PT Start Time 0845   PT Stop Time 0900   PT Time Calculation (min) 15 min   Activity Tolerance Patient tolerated treatment well   Behavior During Therapy [facility] for tasks assessed/performed      Past Medical History:  Diagnosis Date  . At risk for sudden cardiac death 06/29/2013  . Cataract   . Chronic anticoagulation 05/2013   started  . Chronic kidney disease    Patient reports he is seeing Kidney doctor in September  . Coronary artery disease   . Diabetes mellitus    type 2   . Dyslipidemia   . GERD (gastroesophageal reflux disease)   . History of valve replacement 2010   [facility] Ease bioprosthetic 32m  . Hypertension   . [place] ([place]ox[place]mal [place] fibrillation) [place] 05[place]   converted with amiodarone to SR  . S/P CABG x 3 2010  . S/P cardiac catheterization, Rt & Lt heart cath 06/05/2013 1November 15, 2014 . Stroke ([facility] 2003   R-sided weakness & upper extremity swelling   . Thyroid disease     Past Surgical History:  Procedure Laterality Date  . AORTIC VALVE [facility]  [facility]2009   [facility]  . CARDIAC CATHETERIZATION  06/05/2013   native 3 vessel disease; patent LIMA to LAD, SVG to OM and SVG to PDA; Elevated right and left heart filling pressures, with predominantly pulmonary venous hypertension, normal PVR  . COLONOSCOPY  2007   Dr.  MaAviva Signsinternal hemorrhoids  . COLONOSCOPY N/A 06/16/2016   Procedure: COLONOSCOPY;  Surgeon: NaRogene HoustonMD;  Location: [facility] ENDO SUITE;  Service: Endoscopy;  Laterality: N/A;  1:45  . CORONARY ARTERY BYPASS GRAFT  01/26/2009   LIMA to LAD, SVG to circumflex, SVG to PDA  . GRAFT(S) ANGIOGRAM  06/05/2013   Procedure: GRAFT(S) ANCyril Loosen Surgeon: DaLeonie ManMD;  [facility];  Service: Cardiovascular;;  . LEFT AND RIGHT HEART CATHETERIZATION WITH CORONARY ANGIOGRAM N/A 06/05/2013   Procedure: LEFT AND RIGHT HEART CATHETERIZATION WITH CORONARY ANGIOGRAM;  Surgeon: DaLeonie ManMD;  [facility];  Service: Cardiovascular;  Laterality: N/A;  . TRANSTHORACIC ECHOCARDIOGRAM  09/2011   grade 1 diastolic dysfunction; increasing valve gradient; calcified MV annulus   . TRANSTHORACIC ECHOCARDIOGRAM  06/03/2013   EF 25-30%, mod conc. hypertrophy, grade 3 diastolic dysfunction; LA mod dilated; calcified MV annulus; transaortic gradients are normal for the bioprosthetic valve; inf vena cava dilated (elevated CVP) - LifeVest    There were no vitals filed for this visit.                  Wound Therapy - 07/06/16 1125    Subjective [facility] on ground floor.  Reports  no pain or issues currently   Patient and Family Stated Goals wound to heal    Date of Onset 02/18/13   Prior Treatments has received wound PT at this clinic multiple times    Pain Assessment No/denies pain   Evaluation and Treatment Procedures Explained to Patient/Family Yes   Evaluation and Treatment Procedures agreed to   Wound Properties Date First Assessed: 05/09/16 Time First Assessed: 1206 Wound Type: Other (Comment) , R great toe wound   Location: Other (Comment) , R great toe   Location Orientation: Right Wound Description (Comments): R great toe  Present on Admission: Yes   Dressing Type Gauze (Comment);Impregnated  gauze (bismuth)   Dressing Changed Changed   Dressing Status None   Dressing Change Frequency PRN   % Wound base Red or Granulating 100%   % Wound base Yellow/Fibrinous Exudate 0%   Closure None   Drainage Amount Scant   Drainage Description Serosanguineous   Treatment Cleansed;Debridement (Selective)   Wound Properties Date First Assessed: 02/23/16 Wound Type: Other (Comment) , open area R great toe   Location: Other (Comment) , Medial R great toe   Location Orientation: Right;Medial Present on Admission: Yes   Incision Properties Date First Assessed: 04/23/15 Time First Assessed: 1738 Location: Groin Location Orientation: Left Present on Admission: No   Selective Debridement - Location periphery of wound    Selective Debridement - Tools Used Forceps;Scissors   Selective Debridement - Tissue Removed callous    Wound Therapy - Clinical Statement Very little drainage with very little opening remaining.  Removed callous perimeter of wound to improve approximation.  continued with current dressing of xeroform and medipore tape.     Wound Therapy - Functional Problem List dificulty walking, dificulty wearing shoes    Factors Delaying/Impairing Wound Healing Altered sensation;Diabetes Mellitus;Infection - systemic/local;Immobility;Multiple medical problems;Vascular compromise   Hydrotherapy Plan Debridement;Dressing change;Patient/family education   Wound Therapy - Frequency Other (comment)  1-2 times per week    Wound Therapy - Current Recommendations PT   Wound Plan Continue with skilled woundcare with dressing changes as appropriate.                    PT Short Term Goals - 06/14/16 0857      PT SHORT TERM GOAL #1   Title Patient to demonstrate reduction of wound area by at least 50% on all measured dimensions in order to demonstrate improvement of condition    Time 4   Period Weeks   Status Achieved     PT SHORT TERM GOAL #2   Title Patient will verbally be able to  verbally state at least 3 methods of foot care/ways to prevent further wound formation in order to prevent wound recurrence    Time 4   Period Weeks   Status Partially Met           PT Long Term Goals - 06/14/16 8299      PT LONG TERM GOAL #1   Title Patient to be able to wear all pairs of shoes without exacerbation of R great toe in order to improve QOL and recurrence of wound    Time 8   Period Weeks   Status Unable to assess     PT LONG TERM GOAL #2   Title Patient to display full wound closure in order to demonstrate resolution of condition    Time 8   Period Weeks   Status On-going     PT LONG TERM  GOAL #3   Title Patient to be able to verbally state the importance of maintaining blood sugars in relation to proper wound healing and general health status    Time 8   Period Weeks   Status On-going             Patient will benefit from skilled therapeutic intervention in order to improve the following deficits and impairments:     Visit Diagnosis: Unspecified superficial injury of right great toe, sequela  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 Nausea & vomiting 02/24/2016  . A-fib ([place]) 02/23/2016  . Hypothyroidism 02/23/2016  . CKD (chronic kidney disease), stage III 02/23/2016  . Anxiety 02/23/2016  . Chronic combined systolic (congestive) and diastolic (congestive) heart failure 02/23/2016  . Dizziness 02/23/2016  . Open wound of right foot 02/23/2016  . Rectal bleeding 07/28/2015  . Chronic anticoagulation 07/28/2015  . PVD (peripheral vascular disease) ([place])   . S/P angioplasty 04/23/2015  . Peripheral arterial occlusive disease ([place]) 04/23/2015  . PAD (peripheral artery disease) ([place])   . Critical lower limb ischemia   . Toe ulcer, right ([place])   . Type  II or unspecified type diabetes mellitus without mention of complication, uncontrolled 11/27/2013  . Acute respiratory failure with hypoxia ([place]) 11/26/2013  . CAP (community acquired pneumonia) 11/26/2013  . CHF exacerbation ([place]) 11/25/2013  . Cardiomyopathy, ischemic 06/13/2013  . At risk for sudden cardiac death 06-22-13  . S/P cardiac catheterization, Rt & Lt heart cath 06/05/2013 2013/06/22  . Acute on chronic combined systolic and diastolic HF (heart failure), NYHA class 3 ([place]) 06/04/2013  . Atrial flutter with rapid ventricular response ([place]) 06/02/2013  . Acute renal failure: Cr up to 1.8 06/02/2013  . Hyperkalemia 06/02/2013    Class: Acute  . LBBB (left bundle branch block) 06/01/2013  . Hypotension- secondary to AF and Diltiazem Rx 06/01/2013  . Obesity (BMI 30-39.9) 06/01/2013  . Acute on chronic diastolic heart failure - due to Afib AVR 06/01/2013  . Atrial fibrillation with RVR- converted with Amiodarone 05/31/2013  . Stroke- Lt brain 2003 04/08/2013  . Hemiplegia affecting right dominant side ([place]) 04/08/2013  . Cataract 04/08/2013  . GERD (gastroesophageal reflux disease) 04/08/2013  . S/P CABG x 3 - 2010 04/08/2013  . S/P AVR-tissue, 2010 04/08/2013  . DM2 (diabetes mellitus, type 2) ([place]) 04/08/2013  . Dyslipidemia 04/08/2013  . SUBACROMIAL BURSITIS, LEFT 11/05/2009    Teena Irani, PTA/CLT [phone]  07/06/2016, 11:29 AM  [facility] [address] [place], [name], [address] Phone: [phone]   Fax:  [phone]  Name: [name] MRN: [id number] Date of Birth: July 31, 1946

## 2016-07-12 ENCOUNTER — Other Ambulatory Visit: Payer: Self-pay | Admitting: Internal Medicine

## 2016-07-12 ENCOUNTER — Ambulatory Visit (HOSPITAL_COMMUNITY): Payer: Medicare Other | Admitting: Physical Therapy

## 2016-07-12 DIAGNOSIS — E1121 Type 2 diabetes mellitus with diabetic nephropathy: Secondary | ICD-10-CM | POA: Diagnosis not present

## 2016-07-12 DIAGNOSIS — S90931S Unspecified superficial injury of right great toe, sequela: Secondary | ICD-10-CM | POA: Diagnosis not present

## 2016-07-12 DIAGNOSIS — L97513 Non-pressure chronic ulcer of other part of right foot with necrosis of muscle: Secondary | ICD-10-CM

## 2016-07-12 DIAGNOSIS — Z1389 Encounter for screening for other disorder: Secondary | ICD-10-CM | POA: Diagnosis not present

## 2016-07-12 DIAGNOSIS — R2681 Unsteadiness on feet: Secondary | ICD-10-CM | POA: Diagnosis not present

## 2016-07-12 DIAGNOSIS — I6350 Cerebral infarction due to unspecified occlusion or stenosis of unspecified cerebral artery: Secondary | ICD-10-CM | POA: Diagnosis not present

## 2016-07-12 DIAGNOSIS — I779 Disorder of arteries and arterioles, unspecified: Secondary | ICD-10-CM | POA: Diagnosis not present

## 2016-07-12 DIAGNOSIS — R262 Difficulty in walking, not elsewhere classified: Secondary | ICD-10-CM | POA: Diagnosis not present

## 2016-07-12 DIAGNOSIS — G8191 Hemiplegia, unspecified affecting right dominant side: Secondary | ICD-10-CM | POA: Diagnosis not present

## 2016-07-12 NOTE — Therapy (Signed)
[name], [name], [address] Phone: [phone]   Fax:  [phone]  Wound Care Therapy (Re-Assessment/G-codes)  Patient Details  Name: [name] MRN: [id number] Date of Birth: 02-14-1946 No Data Recorded  Encounter Date: 07/12/2016      PT End of Session - 07/12/16 1058    Visit Number 14   Number of Visits 20   Date for PT Re-Evaluation 08/09/16   Authorization Type UHC Medicare (need to charge KX moving forward)   Authorization Time Period 05/09/16 to 07/09/16   Authorization - Visit Number 14   Authorization - Number of Visits 20   PT Start Time 0905   PT Stop Time 0935   PT Time Calculation (min) 30 min   Activity Tolerance Patient tolerated treatment well   Behavior During Therapy [facility] for tasks assessed/performed      Past Medical History:  Diagnosis Date  . At risk for sudden cardiac death 07-03-13  . Cataract   . Chronic anticoagulation 05/2013   started  . Chronic kidney disease    Patient reports he is seeing Kidney doctor in September  . Coronary artery disease   . Diabetes mellitus    type 2   . Dyslipidemia   . GERD (gastroesophageal reflux disease)   . History of valve replacement 2010   [facility] Ease bioprosthetic 42mm  . Hypertension   . PAF (paroxysmal atrial fibrillation) ([place]) 05/2013   converted with amiodarone to SR  . S/P CABG x 3 2010  . S/P cardiac catheterization, Rt & Lt heart cath 06/05/2013 2013-07-03  . Stroke [facility]) 2003   R-sided weakness & upper extremity swelling   . Thyroid disease     Past Surgical History:  Procedure Laterality Date  . AORTIC VALVE REPLACEMENT  01/26/2009   [facility] Ease bioprosthetic 7mm valve  . CARDIAC CATHETERIZATION  06/05/2013   native 3 vessel disease; patent LIMA to LAD, SVG to OM and SVG to PDA; Elevated right and left heart filling pressures, with predominantly pulmonary venous hypertension, normal PVR  .  COLONOSCOPY  2007   Dr. Aviva Signs: internal hemorrhoids  . COLONOSCOPY N/A 06/16/2016   Procedure: COLONOSCOPY;  Surgeon: Rogene Houston, MD;  Location[facility];  Service: Endoscopy;  Laterality: N/A;  1:45  . CORONARY ARTERY BYPASS GRAFT  01/26/2009   LIMA to LAD, SVG to circumflex, SVG to PDA  . GRAFT(S) ANGIOGRAM  06/05/2013   Procedure: GRAFT(S) Cyril Loosen;  Surgeon: Leonie Man, MD;  Location: [facility] CATH LAB;  Service: Cardiovascular;;  . LEFT AND RIGHT HEART CATHETERIZATION WITH CORONARY ANGIOGRAM N/A 06/05/2013   Procedure: LEFT AND RIGHT HEART CATHETERIZATION WITH CORONARY ANGIOGRAM;  Surgeon: Leonie Man, MD;  Location: [facility] CATH LAB;  Service: Cardiovascular;  Laterality: N/A;  . TRANSTHORACIC ECHOCARDIOGRAM  09/2011   grade 1 diastolic dysfunction; increasing valve gradient; calcified MV annulus   . TRANSTHORACIC ECHOCARDIOGRAM  06/03/2013   EF 25-30%, mod conc. hypertrophy, grade 3 diastolic dysfunction; LA mod dilated; calcified MV annulus; transaortic gradients are normal for the bioprosthetic valve; inf vena cava dilated (elevated CVP) - LifeVest    There were no vitals filed for this visit.                  Wound Therapy - 07/12/16 1056    Subjective PT reports no pain or issues   Patient and Family Stated Goals wound to heal    Date of  Onset 02/18/13   Prior Treatments has received wound PT at this clinic multiple times    Evaluation and Treatment Procedures Explained to Patient/Family Yes   Evaluation and Treatment Procedures agreed to   Wound Properties Date First Assessed: 05/09/16 Time First Assessed: 1206 Wound Type: Other (Comment) , R great toe wound   Location: Other (Comment) , R great toe   Location Orientation: Right Wound Description (Comments): R great toe  Present on Admission: Yes   Dressing Type Gauze (Comment);Impregnated gauze (bismuth)   Dressing Changed Changed   Dressing Status None   Dressing Change Frequency PRN   Site /  Wound Assessment Clean;Dry   % Wound base Red or Granulating 100%   % Wound base Yellow/Fibrinous Exudate 0%   Wound Length (cm) 0.3 cm   Wound Width (cm) 0.3 cm   Wound Depth (cm) 0 cm   Closure Approximated   Drainage Amount Scant   Drainage Description Serosanguineous   Treatment Cleansed;Debridement (Selective)   Wound Properties Date First Assessed: 02/23/16 Wound Type: Other (Comment) , open area R great toe   Location: Other (Comment) , Medial R great toe   Location Orientation: Right;Medial Present on Admission: Yes   Incision Properties Date First Assessed: 04/23/15 Time First Assessed: 1738 Location: Groin Location Orientation: Left Present on Admission: No   Selective Debridement - Location periphery of wound    Selective Debridement - Tools Used Forceps;Scissors   Selective Debridement - Tissue Removed callous    Wound Therapy - Clinical Statement wound appeared to be closed, however with inspection, callous has covered wound without approximation. Able to debride all surrounding callous to promoted approximation.  continued with xerform.  Anticipate full closure by next session.     Wound Therapy - Functional Problem List dificulty walking, dificulty wearing shoes    Factors Delaying/Impairing Wound Healing Altered sensation;Diabetes Mellitus;Infection - systemic/local;Immobility;Multiple medical problems;Vascular compromise   Hydrotherapy Plan Debridement;Dressing change;Patient/family education   Wound Therapy - Frequency Other (comment)  1-2 times per week    Wound Therapy - Current Recommendations PT   Wound Plan Continue with skilled woundcare with dressing changes as appropriate.                    PT Short Term Goals - 07/12/16 1059      PT SHORT TERM GOAL #1   Title Patient to demonstrate reduction of wound area by at least 50% on all measured dimensions in order to demonstrate improvement of condition    Time 4   Period Weeks   Status Achieved     PT  SHORT TERM GOAL #2   Title Patient will verbally be able to verbally state at least 3 methods of foot care/ways to prevent further wound formation in order to prevent wound recurrence    Time 4   Period Weeks   Status Achieved           PT Long Term Goals - 07/12/16 1059      PT LONG TERM GOAL #1   Title Patient to be able to wear all pairs of shoes without exacerbation of R great toe in order to improve QOL and recurrence of wound    Time 8   Period Weeks   Status Unable to assess     PT LONG TERM GOAL #2   Title Patient to display full wound closure in order to demonstrate resolution of condition    Time 8   Period Weeks   Status  On-going     PT LONG TERM GOAL #3   Title Patient to be able to verbally state the importance of maintaining blood sugars in relation to proper wound healing and general health status    Time 8   Period Weeks   Status On-going             Patient will benefit from skilled therapeutic intervention in order to improve the following deficits and impairments:     Visit Diagnosis: Unspecified superficial injury of right great toe, sequela  Non-pressure chronic ulcer of other part of right foot with necrosis of muscle ([place])     Problem List Patient Active Problem List   Diagnosis Date Noted  . Nausea & vomiting 02/24/2016  . A-fib ([place]) 02/23/2016  . Hypothyroidism 02/23/2016  . CKD (chronic kidney disease), stage III 02/23/2016  . Anxiety 02/23/2016  . Chronic combined systolic (congestive) and diastolic (congestive) heart failure 02/23/2016  . Dizziness 02/23/2016  . Open wound of right foot 02/23/2016  . Rectal bleeding 07/28/2015  . Chronic anticoagulation 07/28/2015  . PVD (peripheral vascular disease) ([place])   . S/P angioplasty 04/23/2015  . Peripheral arterial occlusive disease ([place]) 04/23/2015  . PAD (peripheral artery disease) ([place])   . Critical lower limb ischemia   . Toe ulcer, right ([place])   . Type II or unspecified  type diabetes mellitus without mention of complication, uncontrolled 11/27/2013  . Acute respiratory failure with hypoxia ([place]) 11/26/2013  . CAP (community acquired pneumonia) 11/26/2013  . CHF exacerbation ([place]) 11/25/2013  . Cardiomyopathy, ischemic 06/13/2013  . At risk for sudden cardiac death June 09, 2013  . S/P cardiac catheterization, Rt & Lt heart cath 06/05/2013 06/09/2013  . Acute on chronic combined systolic and diastolic HF (heart failure), NYHA class 3 ([place]) 06/04/2013  . Atrial flutter with rapid ventricular response ([place]) 06/02/2013  . Acute renal failure: Cr up to 1.8 06/02/2013  . Hyperkalemia 06/02/2013    Class: Acute  . LBBB (left bundle branch block) 06/01/2013  . Hypotension- secondary to AF and Diltiazem Rx 06/01/2013  . Obesity (BMI 30-39.9) 06/01/2013  . Acute on chronic diastolic heart failure - due to Afib AVR 06/01/2013  . Atrial fibrillation with RVR- converted with Amiodarone 05/31/2013  . Stroke- Lt brain 2003 04/08/2013  . Hemiplegia affecting right dominant side ([place]) 04/08/2013  . Cataract 04/08/2013  . GERD (gastroesophageal reflux disease) 04/08/2013  . S/P CABG x 3 - 2010 04/08/2013  . S/P AVR-tissue, 2010 04/08/2013  . DM2 (diabetes mellitus, type 2) ([place]) 04/08/2013  . Dyslipidemia 04/08/2013  . SUBACROMIAL BURSITIS, LEFT 11/05/2009    Teena Irani, PTA/CLT [phone]  07/14/2016, 11:02 AM   G-codes July 14, 2016 Based on skilled clinical assessment of wound size, slough, self-care Current CI Goal [facility]   Deniece Ree PT, DPT [place], [name], [address] Phone: [phone]   Fax:  [phone]  Name: ABDULMALIK BIESINGER MRN: [id number] Date of Birth: 1945-12-26

## 2016-07-13 NOTE — Addendum Note (Signed)
Addended by: Hunt Oris on: 07/13/2016 01:13 PM   Modules accepted: Orders

## 2016-07-14 ENCOUNTER — Ambulatory Visit (HOSPITAL_COMMUNITY): Payer: Medicare Other | Admitting: Physical Therapy

## 2016-07-14 DIAGNOSIS — L97513 Non-pressure chronic ulcer of other part of right foot with necrosis of muscle: Secondary | ICD-10-CM

## 2016-07-14 DIAGNOSIS — S90931S Unspecified superficial injury of right great toe, sequela: Secondary | ICD-10-CM | POA: Diagnosis not present

## 2016-07-14 DIAGNOSIS — R262 Difficulty in walking, not elsewhere classified: Secondary | ICD-10-CM

## 2016-07-14 DIAGNOSIS — R2681 Unsteadiness on feet: Secondary | ICD-10-CM

## 2016-07-14 NOTE — Therapy (Signed)
[place], [name], [address] Phone: [phone]   Fax:  [phone]  Wound Care Therapy  Patient Details  Name: [name] MRN: [id number] Date of Birth: 1946-01-20 No Data Recorded  Encounter Date: 07/14/2016      PT End of Session - 07/14/16 1458    Visit Number 15   Number of Visits 20   Date for PT Re-Evaluation 08/09/16   Authorization Type UHC Medicare (need to charge KX moving forward)   Authorization Time Period 05/09/16 to 07/09/16   Authorization - Visit Number 15   Authorization - Number of Visits 20   PT Start Time 0810   PT Stop Time 0835   PT Time Calculation (min) 25 min   Activity Tolerance Patient tolerated treatment well   Behavior During Therapy [facility] for tasks assessed/performed      Past Medical History:  Diagnosis Date  . At risk for sudden cardiac death June 25, 2013  . Cataract   . Chronic anticoagulation 05/2013   started  . Chronic kidney disease    Patient reports he is seeing Kidney doctor in September  . Coronary artery disease   . Diabetes mellitus    type 2   . Dyslipidemia   . GERD (gastroesophageal reflux disease)   . History of valve replacement 2010   [facility] Ease bioprosthetic 43mm  . Hypertension   . PAF (paroxysmal atrial fibrillation) ([place]) 05/2013   converted with amiodarone to SR  . S/P CABG x 3 2010  . S/P cardiac catheterization, Rt & Lt heart cath 06/05/2013 06-25-13  . Stroke [facility]) 2003   R-sided weakness & upper extremity swelling   . Thyroid disease     Past Surgical History:  Procedure Laterality Date  . AORTIC VALVE REPLACEMENT  01/26/2009   [facility] Ease bioprosthetic 40mm valve  . CARDIAC CATHETERIZATION  06/05/2013   native 3 vessel disease; patent LIMA to LAD, SVG to OM and SVG to PDA; Elevated right and left heart filling pressures, with predominantly pulmonary venous hypertension, normal PVR  . COLONOSCOPY  2007   Dr.  Aviva Signs: internal hemorrhoids  . COLONOSCOPY N/A 06/16/2016   Procedure: COLONOSCOPY;  Surgeon: Rogene Houston, MD;  Location[facility];  Service: Endoscopy;  Laterality: N/A;  1:45  . CORONARY ARTERY BYPASS GRAFT  01/26/2009   LIMA to LAD, SVG to circumflex, SVG to PDA  . GRAFT(S) ANGIOGRAM  06/05/2013   Procedure: GRAFT(S) Cyril Loosen;  Surgeon: Leonie Man, MD;  Location: [facility] CATH LAB;  Service: Cardiovascular;;  . LEFT AND RIGHT HEART CATHETERIZATION WITH CORONARY ANGIOGRAM N/A 06/05/2013   Procedure: LEFT AND RIGHT HEART CATHETERIZATION WITH CORONARY ANGIOGRAM;  Surgeon: Leonie Man, MD;  Location: [facility] CATH LAB;  Service: Cardiovascular;  Laterality: N/A;  . TRANSTHORACIC ECHOCARDIOGRAM  09/2011   grade 1 diastolic dysfunction; increasing valve gradient; calcified MV annulus   . TRANSTHORACIC ECHOCARDIOGRAM  06/03/2013   EF 25-30%, mod conc. hypertrophy, grade 3 diastolic dysfunction; LA mod dilated; calcified MV annulus; transaortic gradients are normal for the bioprosthetic valve; inf vena cava dilated (elevated CVP) - LifeVest    There were no vitals filed for this visit.                  Wound Therapy - 07/14/16 1457    Subjective no pain   Patient and Family Stated Goals wound to heal    Date of Onset 02/18/13   Prior  Treatments has received wound PT at this clinic multiple times    Evaluation and Treatment Procedures Explained to Patient/Family Yes   Evaluation and Treatment Procedures agreed to   Wound Properties Date First Assessed: 05/09/16 Time First Assessed: 1206 Wound Type: Other (Comment) , R great toe wound   Location: Other (Comment) , R great toe   Location Orientation: Right Wound Description (Comments): R great toe  Present on Admission: Yes   Dressing Type Gauze (Comment);Impregnated gauze (bismuth)   Dressing Changed Changed   Dressing Status None   Dressing Change Frequency PRN   Site / Wound Assessment Clean;Dry   % Wound base Red or  Granulating 100%   % Wound base Yellow/Fibrinous Exudate 0%   Closure Approximated   Drainage Amount Scant   Drainage Description Serosanguineous   Treatment Cleansed;Debridement (Selective)   Wound Properties Date First Assessed: 02/23/16 Wound Type: Other (Comment) , open area R great toe   Location: Other (Comment) , Medial R great toe   Location Orientation: Right;Medial Present on Admission: Yes   Incision Properties Date First Assessed: 04/23/15 Time First Assessed: 1738 Location: Groin Location Orientation: Left Present on Admission: No   Selective Debridement - Location periphery of wound    Selective Debridement - Tools Used Forceps;Scissors   Selective Debridement - Tissue Removed callous    Wound Therapy - Clinical Statement Little change in closure in raw area at end of toe.  Lateral area with further callous removal.  Continued with xeroform to both areas with medipore tape.    Wound Therapy - Functional Problem List dificulty walking, dificulty wearing shoes    Factors Delaying/Impairing Wound Healing Altered sensation;Diabetes Mellitus;Infection - systemic/local;Immobility;Multiple medical problems;Vascular compromise   Hydrotherapy Plan Debridement;Dressing change;Patient/family education   Wound Therapy - Frequency Other (comment)  1-2 times per week    Wound Therapy - Current Recommendations PT   Wound Plan Continue with skilled woundcare with dressing changes as appropriate.                    PT Short Term Goals - 07/12/16 1059      PT SHORT TERM GOAL #1   Title Patient to demonstrate reduction of wound area by at least 50% on all measured dimensions in order to demonstrate improvement of condition    Time 4   Period Weeks   Status Achieved     PT SHORT TERM GOAL #2   Title Patient will verbally be able to verbally state at least 3 methods of foot care/ways to prevent further wound formation in order to prevent wound recurrence    Time 4   Period Weeks    Status Achieved           PT Long Term Goals - 07/12/16 1059      PT LONG TERM GOAL #1   Title Patient to be able to wear all pairs of shoes without exacerbation of R great toe in order to improve QOL and recurrence of wound    Time 8   Period Weeks   Status Unable to assess     PT LONG TERM GOAL #2   Title Patient to display full wound closure in order to demonstrate resolution of condition    Time 8   Period Weeks   Status On-going     PT LONG TERM GOAL #3   Title Patient to be able to verbally state the importance of maintaining blood sugars in relation to proper wound healing and  general health status    Time 8   Period Weeks   Status On-going             Patient will benefit from skilled therapeutic intervention in order to improve the following deficits and impairments:     Visit Diagnosis: Unspecified superficial injury of right great toe, sequela  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 Nausea & vomiting 02/24/2016  . A-fib ([place]) 02/23/2016  . Hypothyroidism 02/23/2016  . CKD (chronic kidney disease), stage III 02/23/2016  . Anxiety 02/23/2016  . Chronic combined systolic (congestive) and diastolic (congestive) heart failure 02/23/2016  . Dizziness 02/23/2016  . Open wound of right foot 02/23/2016  . Rectal bleeding 07/28/2015  . Chronic anticoagulation 07/28/2015  . PVD (peripheral vascular disease) ([place])   . S/P angioplasty 04/23/2015  . Peripheral arterial occlusive disease ([place]) 04/23/2015  . PAD (peripheral artery disease) ([place])   . Critical lower limb ischemia   . Toe ulcer, right ([place])   . Type II or unspecified type diabetes mellitus without mention of complication, uncontrolled 11/27/2013  . Acute respiratory failure with hypoxia ([place])  11/26/2013  . CAP (community acquired pneumonia) 11/26/2013  . CHF exacerbation ([place]) 11/25/2013  . Cardiomyopathy, ischemic 06/13/2013  . At risk for sudden cardiac death 05-Jul-2013  . S/P cardiac catheterization, Rt & Lt heart cath 06/05/2013 Jul 05, 2013  . Acute on chronic combined systolic and diastolic HF (heart failure), NYHA class 3 ([place]) 06/04/2013  . Atrial flutter with rapid ventricular response ([place]) 06/02/2013  . Acute renal failure: Cr up to 1.8 06/02/2013  . Hyperkalemia 06/02/2013    Class: Acute  . LBBB (left bundle branch block) 06/01/2013  . Hypotension- secondary to AF and Diltiazem Rx 06/01/2013  . Obesity (BMI 30-39.9) 06/01/2013  . Acute on chronic diastolic heart failure - due to Afib AVR 06/01/2013  . Atrial fibrillation with RVR- converted with Amiodarone 05/31/2013  . Stroke- Lt brain 2003 04/08/2013  . Hemiplegia affecting right dominant side ([place]) 04/08/2013  . Cataract 04/08/2013  . GERD (gastroesophageal reflux disease) 04/08/2013  . S/P CABG x 3 - 2010 04/08/2013  . S/P AVR-tissue, 2010 04/08/2013  . DM2 (diabetes mellitus, type 2) ([place]) 04/08/2013  . Dyslipidemia 04/08/2013  . SUBACROMIAL BURSITIS, LEFT 11/05/2009    Teena Irani, PTA/CLT [phone]  07/14/2016, 3:00 PM  [place] [address] [place], [name], [address] Phone: [phone]   Fax:  [phone]  Name: [name] MRN: [id number] Date of Birth: 1946/01/20

## 2016-07-19 ENCOUNTER — Ambulatory Visit (HOSPITAL_COMMUNITY): Payer: Medicare Other | Admitting: Physical Therapy

## 2016-07-21 ENCOUNTER — Ambulatory Visit (HOSPITAL_COMMUNITY): Payer: Medicare Other | Attending: Orthopaedic Surgery | Admitting: Physical Therapy

## 2016-07-21 DIAGNOSIS — L97513 Non-pressure chronic ulcer of other part of right foot with necrosis of muscle: Secondary | ICD-10-CM

## 2016-07-21 DIAGNOSIS — S90931S Unspecified superficial injury of right great toe, sequela: Secondary | ICD-10-CM | POA: Diagnosis not present

## 2016-07-21 DIAGNOSIS — R262 Difficulty in walking, not elsewhere classified: Secondary | ICD-10-CM | POA: Insufficient documentation

## 2016-07-21 DIAGNOSIS — R2681 Unsteadiness on feet: Secondary | ICD-10-CM | POA: Diagnosis not present

## 2016-07-21 NOTE — Therapy (Signed)
[name], [name], [address] Phone: [phone]   Fax:  [phone]  Wound Care Therapy (Discharge)  Patient Details  Name: [name] MRN: [id number] Date of Birth: 1945/12/07 No Data Recorded  Encounter Date: 07/21/2016      PT End of Session - 07/21/16 1108    Visit Number 16   Number of Visits 20   Date for PT Re-Evaluation 08/09/16   Authorization Type [employer] Medicare (need to charge KX moving forward)   Authorization Time Period 05/09/16 to 07/09/16   Authorization - Visit Number 58   Authorization - Number of Visits 20   PT Start Time 0935  pt was late for appointment   PT Stop Time 0947   PT Time Calculation (min) 12 min   Activity Tolerance Patient tolerated treatment well   Behavior During Therapy [facility] for tasks assessed/performed      Past Medical History:  Diagnosis Date  . At risk for sudden cardiac death 27-Jun-2013  . Cataract   . Chronic anticoagulation 05/2013   started  . Chronic kidney disease    Patient reports he is seeing Kidney doctor in September  . Coronary artery disease   . Diabetes mellitus    type 2   . Dyslipidemia   . GERD (gastroesophageal reflux disease)   . History of valve replacement 2010   [facility] Ease bioprosthetic 30m  . Hypertension   . PAF (paroxysmal atrial fibrillation) ([place] 05/2013   converted with amiodarone to SR  . S/P CABG x 3 2010  . S/P cardiac catheterization, Rt & Lt heart cath 06/05/2013 12014-11-13 . Stroke ([facility] 2003   R-sided weakness & upper extremity swelling   . Thyroid disease     Past Surgical History:  Procedure Laterality Date  . [facility]  [facility]  . CARDIAC CATHETERIZATION  06/05/2013   native 3 vessel disease; patent LIMA to LAD, SVG to OM and SVG to PDA; Elevated right and left heart filling pressures, with predominantly pulmonary venous hypertension,  normal PVR  . COLONOSCOPY  2007   Dr. MaAviva Signsinternal hemorrhoids  . COLONOSCOPY N/A 06/16/2016   Procedure: COLONOSCOPY;  Surgeon: NaRogene HoustonMD;  Location: [facility] ENDO SUITE;  Service: Endoscopy;  Laterality: N/A;  1:45  . CORONARY ARTERY BYPASS GRAFT  01/26/2009   LIMA to LAD, SVG to circumflex, SVG to PDA  . GRAFT(S) ANGIOGRAM  06/05/2013   Procedure: GRAFT(S) ANCyril Loosen Surgeon: DaLeonie ManMD;  [facility];  Service: Cardiovascular;;  . LEFT AND RIGHT HEART CATHETERIZATION WITH CORONARY ANGIOGRAM N/A 06/05/2013   Procedure: LEFT AND RIGHT HEART CATHETERIZATION WITH CORONARY ANGIOGRAM;  Surgeon: DaLeonie ManMD;  [facility];  Service: Cardiovascular;  Laterality: N/A;  . TRANSTHORACIC ECHOCARDIOGRAM  09/2011   grade 1 diastolic dysfunction; increasing valve gradient; calcified MV annulus   . TRANSTHORACIC ECHOCARDIOGRAM  06/03/2013   EF 25-30%, mod conc. hypertrophy, grade 3 diastolic dysfunction; LA mod dilated; calcified MV annulus; transaortic gradients are normal for the bioprosthetic valve; inf vena cava dilated (elevated CVP) - LifeVest    There were no vitals filed for this visit.                  Wound Therapy - 07/21/16 1100    Subjective no pain, overslept this morning   Patient and Family Stated Goals wound to heal  Date of Onset 02/18/13   Prior Treatments has received wound PT at this clinic multiple times    Evaluation and Treatment Procedures Explained to Patient/Family Yes   Evaluation and Treatment Procedures agreed to   Wound Properties Date First Assessed: 05/09/16 Time First Assessed: 1206 Wound Type: Other (Comment) , R great toe wound   Location: Other (Comment) , R great toe   Location Orientation: Right Wound Description (Comments): R great toe  Present on Admission: Yes   Dressing Type Gauze (Comment);Impregnated gauze (bismuth)   Dressing Changed Changed   Dressing Status Clean;Dry;Intact   Dressing  Change Frequency Other (Comment)   Site / Wound Assessment Clean;Dry   % Wound base Red or Granulating 100%   Closure Approximated   Drainage Amount None   Treatment Cleansed   Wound Properties Date First Assessed: 02/23/16 Wound Type: Other (Comment) , open area R great toe   Location: Other (Comment) , Medial R great toe   Location Orientation: Right;Medial Present on Admission: Yes   Incision Properties Date First Assessed: 04/23/15 Time First Assessed: 1738 Location: Groin Location Orientation: Left Present on Admission: No   Wound Therapy - Clinical Statement Wound is now healed.  counseled on importance of keeping bandage over this area as skin is still tender and susceptible to chronic break down from ataxic gait from CVA and general physical condition.  Pt verbalized understanding.  Also urged patient to return to MD regarding his current difficulties with sleeping as may need further studies.    Wound Plan Discharge as wound fully healed at this point.                   PT Short Term Goals - 07/21/16 1107      PT SHORT TERM GOAL #1   Title Patient to demonstrate reduction of wound area by at least 50% on all measured dimensions in order to demonstrate improvement of condition    Time 4   Period Weeks   Status Achieved     PT SHORT TERM GOAL #2   Title Patient will verbally be able to verbally state at least 3 methods of foot care/ways to prevent further wound formation in order to prevent wound recurrence    Time 4   Period Weeks   Status Achieved           PT Long Term Goals - 07/21/16 1107      PT LONG TERM GOAL #1   Title Patient to be able to wear all pairs of shoes without exacerbation of R great toe in order to improve QOL and recurrence of wound    Time 8   Period Weeks   Status Unable to assess     PT LONG TERM GOAL #2   Title Patient to display full wound closure in order to demonstrate resolution of condition    Time 8   Period Weeks   Status  Achieved     PT LONG TERM GOAL #3   Title Patient to be able to verbally state the importance of maintaining blood sugars in relation to proper wound healing and general health status    Time 8   Period Weeks   Status Achieved             Patient will benefit from skilled therapeutic intervention in order to improve the following deficits and impairments:     Visit Diagnosis: Unspecified superficial injury of right great toe, sequela  Non-pressure chronic ulcer of  other part of right foot with necrosis of muscle ([place])  Unsteadiness on feet  Difficulty in walking, not elsewhere classified    Problem List Patient Active Problem List   Diagnosis Date Noted  . Nausea & vomiting 02/24/2016  . A-fib ([place]) 02/23/2016  . Hypothyroidism 02/23/2016  . CKD (chronic kidney disease), stage III 02/23/2016  . Anxiety 02/23/2016  . Chronic combined systolic (congestive) and diastolic (congestive) heart failure 02/23/2016  . Dizziness 02/23/2016  . Open wound of right foot 02/23/2016  . Rectal bleeding 07/28/2015  . Chronic anticoagulation 07/28/2015  . PVD (peripheral vascular disease) ([place])   . S/P angioplasty 04/23/2015  . Peripheral arterial occlusive disease ([place]) 04/23/2015  . PAD (peripheral artery disease) ([place])   . Critical lower limb ischemia   . Toe ulcer, right ([place])   . Type II or unspecified type diabetes mellitus without mention of complication, uncontrolled 11/27/2013  . Acute respiratory failure with hypoxia ([place]) 11/26/2013  . CAP (community acquired pneumonia) 11/26/2013  . CHF exacerbation ([place]) 11/25/2013  . Cardiomyopathy, ischemic 06/13/2013  . At risk for sudden cardiac death June 24, 2013  . S/P cardiac catheterization, Rt & Lt heart cath 06/05/2013 Jun 24, 2013  . Acute on chronic combined systolic and diastolic HF (heart failure), NYHA class 3 ([place]) 06/04/2013  . Atrial flutter with rapid ventricular response ([place]) 06/02/2013  . Acute renal failure: Cr  up to 1.8 06/02/2013  . Hyperkalemia 06/02/2013    Class: Acute  . LBBB (left bundle branch block) 06/01/2013  . Hypotension- secondary to AF and Diltiazem Rx 06/01/2013  . Obesity (BMI 30-39.9) 06/01/2013  . Acute on chronic diastolic heart failure - due to Afib AVR 06/01/2013  . Atrial fibrillation with RVR- converted with Amiodarone 05/31/2013  . Stroke- Lt brain 2003 04/08/2013  . Hemiplegia affecting right dominant side ([place]) 04/08/2013  . Cataract 04/08/2013  . GERD (gastroesophageal reflux disease) 04/08/2013  . S/P CABG x 3 - 2010 04/08/2013  . S/P AVR-tissue, 2010 04/08/2013  . DM2 (diabetes mellitus, type 2) ([place]) 04/08/2013  . Dyslipidemia 04/08/2013  . SUBACROMIAL BURSITIS, LEFT 11/05/2009    Teena Irani, PTA/CLT [phone]  2016-08-07, 6:07 PM       G-Codes - 2016-08-07 October 29, 1804    Functional Assessment Tool Used Based on skilled clinical assessment of wound size, slough, self care    Functional Limitation Other PT primary   Other PT Primary Goal Status (Q0086) 0 percent impaired, limited or restricted   Other PT Primary Discharge Status [phone]) 0 percent impaired, limited or restricted      PHYSICAL THERAPY DISCHARGE SUMMARY  Visits from Start of Care: 16  Current functional level related to goals / functional outcomes: Wound has fully healed. DC.    Remaining deficits: None, wound has achieved full healing    Education / Equipment: Continue to protect this area as it is subject to chronic breakdown and wound reformation  Plan: Patient agrees to discharge.  Patient goals were met. Patient is being discharged due to meeting the stated rehab goals.  ?????     Deniece Ree PT, DPT [facility] [address] [place], [name], [address] Phone: [phone]   Fax:  [phone]  Name: [name] MRN: [id number] Date of Birth: 25-Aug-1945

## 2016-07-26 ENCOUNTER — Ambulatory Visit (HOSPITAL_COMMUNITY): Payer: Medicare Other | Admitting: Physical Therapy

## 2016-07-28 ENCOUNTER — Ambulatory Visit (HOSPITAL_COMMUNITY): Payer: Medicare Other | Admitting: Physical Therapy

## 2016-08-02 ENCOUNTER — Ambulatory Visit (HOSPITAL_COMMUNITY): Payer: Medicare Other | Admitting: Physical Therapy

## 2016-08-04 ENCOUNTER — Ambulatory Visit (HOSPITAL_COMMUNITY): Payer: Medicare Other | Admitting: Physical Therapy

## 2016-08-09 ENCOUNTER — Ambulatory Visit (HOSPITAL_COMMUNITY): Payer: Medicare Other | Admitting: Physical Therapy

## 2016-08-11 ENCOUNTER — Ambulatory Visit (HOSPITAL_COMMUNITY): Payer: Medicare Other | Admitting: Physical Therapy

## 2016-08-13 ENCOUNTER — Other Ambulatory Visit: Payer: Self-pay | Admitting: Internal Medicine

## 2016-08-16 NOTE — Telephone Encounter (Signed)
Rx has been sent to the pharmacy electronically. ° °

## 2016-08-18 ENCOUNTER — Encounter: Payer: Self-pay | Admitting: Interventional Radiology

## 2016-09-15 ENCOUNTER — Other Ambulatory Visit: Payer: Self-pay | Admitting: Internal Medicine

## 2016-09-15 NOTE — Telephone Encounter (Signed)
Rx has been sent to the pharmacy electronically. ° °

## 2016-10-05 DIAGNOSIS — E1121 Type 2 diabetes mellitus with diabetic nephropathy: Secondary | ICD-10-CM | POA: Diagnosis not present

## 2016-10-05 DIAGNOSIS — G894 Chronic pain syndrome: Secondary | ICD-10-CM | POA: Diagnosis not present

## 2016-10-05 DIAGNOSIS — Z79899 Other long term (current) drug therapy: Secondary | ICD-10-CM | POA: Diagnosis not present

## 2016-10-05 DIAGNOSIS — K219 Gastro-esophageal reflux disease without esophagitis: Secondary | ICD-10-CM | POA: Diagnosis not present

## 2016-10-05 DIAGNOSIS — Z125 Encounter for screening for malignant neoplasm of prostate: Secondary | ICD-10-CM | POA: Diagnosis not present

## 2016-10-05 DIAGNOSIS — E782 Mixed hyperlipidemia: Secondary | ICD-10-CM | POA: Diagnosis not present

## 2016-10-05 DIAGNOSIS — I6350 Cerebral infarction due to unspecified occlusion or stenosis of unspecified cerebral artery: Secondary | ICD-10-CM | POA: Diagnosis not present

## 2016-10-05 DIAGNOSIS — Z1389 Encounter for screening for other disorder: Secondary | ICD-10-CM | POA: Diagnosis not present

## 2016-10-05 DIAGNOSIS — R5383 Other fatigue: Secondary | ICD-10-CM | POA: Diagnosis not present

## 2016-10-11 ENCOUNTER — Other Ambulatory Visit: Payer: Self-pay | Admitting: Internal Medicine

## 2016-10-14 ENCOUNTER — Encounter: Payer: Self-pay | Admitting: Internal Medicine

## 2016-10-14 ENCOUNTER — Ambulatory Visit (INDEPENDENT_AMBULATORY_CARE_PROVIDER_SITE_OTHER): Payer: Medicare Other | Admitting: Internal Medicine

## 2016-10-14 VITALS — BP 110/70 | HR 84 | Ht 70.0 in | Wt 253.2 lb

## 2016-10-14 DIAGNOSIS — I447 Left bundle-branch block, unspecified: Secondary | ICD-10-CM | POA: Diagnosis not present

## 2016-10-14 DIAGNOSIS — I255 Ischemic cardiomyopathy: Secondary | ICD-10-CM

## 2016-10-14 DIAGNOSIS — Z952 Presence of prosthetic heart valve: Secondary | ICD-10-CM | POA: Diagnosis not present

## 2016-10-14 DIAGNOSIS — I4891 Unspecified atrial fibrillation: Secondary | ICD-10-CM | POA: Diagnosis not present

## 2016-10-14 DIAGNOSIS — Z951 Presence of aortocoronary bypass graft: Secondary | ICD-10-CM | POA: Diagnosis not present

## 2016-10-14 MED ORDER — APIXABAN 5 MG PO TABS: 5.0000 mg | ORAL_TABLET | Freq: Two times a day (BID) | ORAL | 1 refills | Status: AC

## 2016-10-14 MED ORDER — AMIODARONE HCL 200 MG PO TABS: 200.0000 mg | ORAL_TABLET | Freq: Every day | ORAL | 3 refills | Status: AC

## 2016-10-14 MED ORDER — TORSEMIDE 10 MG PO TABS: 10.0000 mg | ORAL_TABLET | Freq: Every day | ORAL | 3 refills | Status: AC

## 2016-10-14 MED ORDER — METOPROLOL SUCCINATE ER 25 MG PO TB24: 25.0000 mg | ORAL_TABLET | Freq: Every day | ORAL | 3 refills | Status: AC

## 2016-10-14 NOTE — Patient Instructions (Addendum)
Medication Instructions: Dr Debara Pickett recommends that you continue on your current medications as directed. Please refer to the Current Medication list given to you today.  Labwork: NONE ORDERED  Testing/Procedures: 1. Echocardiogram at [facility] has requested that you have an echocardiogram. Echocardiography is a painless test that uses sound waves to create images of your heart. It provides your doctor with information about the size and shape of your heart and how well your heart's chambers and valves are working. This procedure takes approximately one hour. There are no restrictions for this procedure.  Follow-up: Dr Debara Pickett recommends that you schedule a follow-up appointment in 6 months. You will receive a reminder letter in the mail two months in advance. If you don't receive a letter, please call our office to schedule the follow-up appointment.  If you need a refill on your cardiac medications before your next appointment, please call your pharmacy.

## 2016-10-14 NOTE — Progress Notes (Signed)
OFFICE NOTE  Chief Complaint:  Routine follow-up, no complaints  Primary Care Physician: [name]., MD  HPI:  [name] a pleasant 71 year old gentleman with history of coronary artery disease status post CABG x3 vessels with Dr. Prescott Gum with a LIMA to the LAD, SVG to circumflex and SVG to PDA. He also had an [facility] Ease bioprosthetic 23-mm valve placed at that time in 2010. He is seen today in followup from the hospital for TCM 7 due to the high risk of admission.  He did receive a phone call after discharge within 24 hours which found him well, continuing to urinate excessively with ongoing weight loss and improved dyspnea.  His history goes back to 2003, unfortunately,when he had a stroke which has caused much difficulty walking as well as some heaviness and some swelling in the right upper extremity particularly. Unfortunately, he has not been able to lose weight. He is active walking but does not necessarily do any exercise. In February 2013 he underwent another echocardiogram to look at his valve gradient. It was increased significantly compared to his prior study. The peak gradient was 26, mean gradient of 14. However, he was not complaining of any worsening dyspnea or anything associated with that. He is describing some symptoms which are concerning for worsening acid reflux including belching and increased heartburn. At his last office visit I increased his protonix to twice daily. This is improved his reflux some, but he still has symptoms associated with some belching and/or flatus which improves. Recently he was admitted to the hospital for atrial fibrillation with rapid ventricular response which was noted when he showed up at his doctor's office after having 1-2 loose stools but no bleeding, no abdominal pain. He did have a couple of episodes of dry heaves and 1-2 episodes of small volume non-bloody vomiting. He reported no significant change with his po  intake however. Over the last day his bowel habits have returned to baseline and he has not had any further nausea or vomiting. He saw his PCP's office 05/31/13 due to SOB and palpitations and after an ECG showed tachyarrhythmia they called 911 and an ambulance brought him to the [facility] ED. On arrival his ECG showed a wide-complex rhythm at 157 bpm and telemetry showed irregularly irregular rhythm. Historical ECG's show NSR with LBBB pattern thus the LBBB was not new. In the ED, physician staff concluded that he had new onset AF and started a Diltiazem infusion with some improvement in the HR but significant irregularity and variance in his HR continued. IV amiodarone was started and coreg changed to lopressor. ACE was held. Lasix given. He eventually changed to atrial flutter and then spontaneously converted to sinus rhythm. Pt hyperkalemic and kayexalate given. Plan for anticoagulation after rt and lt heart cath to eval for ischemic cause of atrial fib. EF by echo no 25-30%. He also has grade 3 diastolic dysfunction.   Pt continued to improve and underwent cardiac cath:   Impression:  1. Native 3 vessel CAD.  2. Patent LIMA to LAD, SVG to OM and SVG to PDA - TIMI III flow  3. Elevated right and left heart filling pressures, with predominantly pulmonary venous hypertension, normal PVR  4. Mildly reduced cardiac output and index   He returns feeling well today. He has had stable weights at home. Fortunately had a recent echo on 09/09/2013 which shows an improved EF of 45-50%, which was previously 30%. There is still  dietary noncompliance and blood sugars are not well controlled ranging in the 200s in the morning.  I saw [name] back today in the office. He's been incredibly stable. In fact his weight is only within a few pounds of his prior weight. He denies any worsening shortness of breath or chest pain. His last echocardiogram showed an EF of 45-50% in January 2015. He seems to be able to do  most activities despite a hemiplegia from prior stroke. He is requesting refills of his torsemide today. Only other news is that he had some poor healing of a toe ulcer. He is going to the wound care center for management of this and hopefully can avoid amputation. Does have significant PAD, although he denies claudication. Finally, he is on L a question is noted some blood in the stool when wiping after bowel movements. This could be due to hemorrhoids but a GI evaluation is warranted.  10/14/2016  [name] returns today for follow-up. He underwent workup for GI bleeding but no clear source could be found. Overall he feels fairly well. His weight is actually stable within a pound of what he was last time. EKG shows left bundle branch block which is unchanged. He denies chest pain or worsening shortness of breath. He is overdue for repeat echo to follow his a bioprosthetic aortic valve. He also has some ischemic cardiomyopathy EF of 45-50% in the past.  PMHx:  Past Medical History:  Diagnosis Date  . At risk for sudden cardiac death June 10, 2013  . Cataract   . Chronic anticoagulation 05/2013   started  . Chronic kidney disease    Patient reports he is seeing Kidney doctor in September  . Coronary artery disease   . Diabetes mellitus    type 2   . Dyslipidemia   . GERD (gastroesophageal reflux disease)   . History of valve replacement 2010   [facility] Ease bioprosthetic 64mm  . Hypertension   . PAF (paroxysmal atrial fibrillation) ([place]) 05/2013   converted with amiodarone to SR  . S/P CABG x 3 2010  . S/P cardiac catheterization, Rt & Lt heart cath 06/05/2013 06-10-13  . Stroke [facility]) 2003   R-sided weakness & upper extremity swelling   . Thyroid disease     Past Surgical History:  Procedure Laterality Date  . AORTIC VALVE REPLACEMENT  01/26/2009   [facility] Ease bioprosthetic 14mm valve  . CARDIAC CATHETERIZATION  06/05/2013   native 3 vessel disease; patent LIMA to  LAD, SVG to OM and SVG to PDA; Elevated right and left heart filling pressures, with predominantly pulmonary venous hypertension, normal PVR  . COLONOSCOPY  2007   Dr. Aviva Signs: internal hemorrhoids  . COLONOSCOPY N/A 06/16/2016   Procedure: COLONOSCOPY;  Surgeon: Rogene Houston, MD;  Location: [facility] ENDO SUITE;  Service: Endoscopy;  Laterality: N/A;  1:45  . CORONARY ARTERY BYPASS GRAFT  01/26/2009   LIMA to LAD, SVG to circumflex, SVG to PDA  . GRAFT(S) ANGIOGRAM  06/05/2013   Procedure: GRAFT(S) Cyril Loosen;  Surgeon: Leonie Man, MD;  Location: [facility] CATH LAB;  Service: Cardiovascular;;  . IR GENERIC HISTORICAL  05/12/2016   IR RADIOLOGIST EVAL & MGMT 05/12/2016 Jacqulynn Cadet, MD [facility] INTERV RAD  . LEFT AND RIGHT HEART CATHETERIZATION WITH CORONARY ANGIOGRAM N/A 06/05/2013   Procedure: LEFT AND RIGHT HEART CATHETERIZATION WITH CORONARY ANGIOGRAM;  Surgeon: Leonie Man, MD;  Location: [facility] CATH LAB;  Service: Cardiovascular;  Laterality: N/A;  . TRANSTHORACIC ECHOCARDIOGRAM  09/2011   grade 1 diastolic dysfunction; increasing valve gradient; calcified MV annulus   . TRANSTHORACIC ECHOCARDIOGRAM  06/03/2013   EF 25-30%, mod conc. hypertrophy, grade 3 diastolic dysfunction; LA mod dilated; calcified MV annulus; transaortic gradients are normal for the bioprosthetic valve; inf vena cava dilated (elevated CVP) - LifeVest    FAMHx:  Family History  Problem Relation Age of Onset  . Heart attack Mother   . Liver disease Brother   . Diabetes Sister     x4    SOCHx:   reports that he quit smoking about 15 years ago. His smoking use included Cigarettes. He has quit using smokeless tobacco. His smokeless tobacco use included Chew. He reports that he does not drink alcohol or use drugs.  ALLERGIES:  No Known Allergies  ROS: Pertinent items noted in HPI and remainder of comprehensive ROS otherwise negative.  HOME MEDS: Current Outpatient Prescriptions  Medication Sig Dispense Refill    . acetaminophen (TYLENOL) 500 MG tablet Take 1,000 mg by mouth 2 (two) times daily as needed for headache. For pain/headaches    . ALPRAZolam (XANAX) 1 MG tablet Take 1 mg by mouth 3 (three) times daily.     Marland Kitchen amiodarone (PACERONE) 200 MG tablet Take 1 tablet (200 mg total) by mouth daily. 90 tablet 3  . apixaban (ELIQUIS) 5 MG TABS tablet Take 1 tablet (5 mg total) by mouth 2 (two) times daily. 180 tablet 1  . atorvastatin (LIPITOR) 20 MG tablet Take 1 tablet (20 mg total) by mouth daily at 6 PM. 90 tablet 1  . docusate sodium (COLACE) 100 MG capsule Take 100 mg by mouth 2 (two) times daily.    . fenofibrate (TRICOR) 145 MG tablet TAKE ONE (1) TABLET EACH DAY 15 tablet 0  . ferrous sulfate 325 (65 FE) MG EC tablet Take 325 mg by mouth daily with breakfast.    . gabapentin (NEURONTIN) 300 MG capsule Take 300 mg by mouth 3 (three) times daily.    . insulin glargine (LANTUS) 100 UNIT/ML injection Inject 0.6-0.75 mLs (60-75 Units total) into the skin 2 (two) times daily. 75 units in the morning then 60 units at bedtime    . insulin lispro (HUMALOG) 100 UNIT/ML injection Inject 0.35-0.5 mLs (35-50 Units total) into the skin 3 (three) times daily before meals. Patient states that he takes 35 units at breakfast, 50 units at dinner and 50 units at supper.    . levothyroxine (SYNTHROID, LEVOTHROID) 25 MCG tablet Take 25 mcg by mouth daily before breakfast.    . meclizine (ANTIVERT) 25 MG tablet Take 25 mg by mouth 3 (three) times daily as needed for dizziness.    . metoprolol succinate (TOPROL-XL) 25 MG 24 hr tablet Take 1 tablet (25 mg total) by mouth daily. 90 tablet 3  . Omega-3 Fatty Acids (FISH OIL) 1200 MG CAPS Take by mouth.    . pantoprazole (PROTONIX) 40 MG tablet Take 1 tablet (40 mg total) by mouth 2 (two) times daily. 180 tablet 1  . torsemide (DEMADEX) 10 MG tablet Take 1 tablet (10 mg total) by mouth daily. 90 tablet 3   No current facility-administered medications for this visit.      LABS/IMAGING: No results found for this or any previous visit (from the past 48 hour(s)). No results found.  VITALS: BP 110/70   Pulse 84   Ht 5\' 10"  (1.778 m)   Wt 253 lb 3.2 oz (114.9 kg)   BMI 36.33 kg/m  EXAM: General appearance: alert and no distress, wearing lifevest Neck: no carotid bruit, no JVD Lungs: clear to auscultation bilaterally Heart: regular rate and rhythm, S1, S2 normal, 2/6 SEM at RUSB, no click, rub or gallop Abdomen: soft, non-tender; bowel sounds normal; no masses,  no organomegaly and obese Extremities: extremities normal, atraumatic, no cyanosis or edema Pulses: 2+ and symmetric Skin: Skin color, texture, turgor normal. No rashes or lesions Neurologic: Grossly normal  EKG: Sinus rhythm at 84, left bundle branch block, first degree AV block  ASSESSMENT: 1. Coronary artery disease status post three-vessel CABG in 2010 2. Status post bioprosthetic aVR in 2010, [facility] MagnaEase valve- 23 mm, gradients of 26/14 mmHg 3. GERD - on protoix 4. Dyslipidemia 5. Diabetes type 2 6. History of stroke with right-sided hemiparesis - complaining of numbness and tingling in his 4th/5th digits on the right hand 7. Atrial fibrillation with rapid ventricular response-spontaneously converted with amiodarone 8. Acute on chronic systolic congestive heart failure-EF now 45-50%, NYHA Class II symptoms 9. Left bundle-branch block-QRS duration 166 msec  PLAN: 1.   Mr. Neeson seems to be doing well on his current medicine. He has not been admitted for heart failure exacerbation. He did have an episode of vertigo for which she was in the hospital. He reports his great toe is healed since he had an ulcer. He is overdue for repeat echo which we'll order today. He appears to be in sinus rhythm on low-dose amiodarone. Follow-up with me in 6 months or sooner as necessary.  Pixie Casino, MD, [facility] Attending Cardiologist [place] [facility] [place] Rana 10/14/2016, 5:34  PM

## 2016-10-21 ENCOUNTER — Encounter: Payer: Self-pay | Admitting: Cardiology

## 2016-10-21 ENCOUNTER — Ambulatory Visit (HOSPITAL_COMMUNITY)
Admission: RE | Admit: 2016-10-21 | Discharge: 2016-10-21 | Disposition: A | Discharge status: Home / Self Care | Payer: Medicare Other | Source: Ambulatory Visit | Attending: Internal Medicine | Admitting: Internal Medicine

## 2016-10-21 DIAGNOSIS — I255 Ischemic cardiomyopathy: Secondary | ICD-10-CM

## 2016-10-21 DIAGNOSIS — I361 Nonrheumatic tricuspid (valve) insufficiency: Secondary | ICD-10-CM | POA: Insufficient documentation

## 2016-10-21 DIAGNOSIS — Z952 Presence of prosthetic heart valve: Secondary | ICD-10-CM

## 2016-10-21 DIAGNOSIS — I510 Cardiac septal defect, acquired: Secondary | ICD-10-CM | POA: Insufficient documentation

## 2016-10-21 DIAGNOSIS — I501 Left ventricular failure: Secondary | ICD-10-CM | POA: Insufficient documentation

## 2016-10-21 DIAGNOSIS — I340 Nonrheumatic mitral (valve) insufficiency: Secondary | ICD-10-CM | POA: Diagnosis not present

## 2016-10-21 LAB — ECHOCARDIOGRAM COMPLETE
AOVTI: 43.2 cm
AV Mean grad: 8 mmHg
AV Peak grad: 15 mmHg
AV area mean vel ind: 0.52 cm2/m2
AV peak Index: 0.56
AV vel: 1.33
AVAREAMEANV: 1.27 cm2
AVAREAVTI: 1.35 cm2
AVAREAVTIIND: 0.55 cm2/m2
AVCELMEANRAT: 0.56
AVLVOTPG: 5 mmHg
AVPKVEL: 193 cm/s
Ao pk vel: 0.6 m/s
CHL CUP AV VALUE AREA INDEX: 0.55
CHL CUP RV SYS PRESS: 9 mmHg
DOP CAL AO MEAN VELOCITY: 126 cm/s
EERAT: 18.37
EWDT: 352 ms
FS: 38 % (ref 28–44)
IVS/LV PW RATIO, ED: 1.26
LA diam index: 1.73 cm/m2
LA vol A4C: 54.7 ml
LA vol index: 24.4 mL/m2
LA vol: 59.1 mL
LASIZE: 42 mm
LDCA: 2.27 cm2
LEFT ATRIUM END SYS DIAM: 42 mm
LV E/e' medial: 18.37
LV E/e'average: 18.37
LV PW d: 14 mm — AB (ref 0.6–1.1)
LV TDI E'LATERAL: 6.64
LV TDI E'MEDIAL: 4.13
LV e' LATERAL: 6.64 cm/s
LVOT SV: 58 mL
LVOT VTI: 25.4 cm
LVOT peak VTI: 0.59 cm
LVOTD: 17 mm
LVOTPV: 115 cm/s
MV Dec: 352
MV pk A vel: 134 m/s
MVPG: 6 mmHg
MVPKEVEL: 122 m/s
Reg peak vel: 124 cm/s
TAPSE: 10.7 mm
TR max vel: 124 cm/s
Valve area: 1.33 cm2

## 2016-10-21 MED ORDER — PERFLUTREN LIPID MICROSPHERE
1.0000 mL | INTRAVENOUS | Status: AC | PRN
  Administered 2016-10-21 (×2): 1 mL via INTRAVENOUS
  Filled 2016-10-21: qty 10

## 2016-10-21 NOTE — Progress Notes (Signed)
*  PRELIMINARY RESULTS* Echocardiogram 2D Echocardiogram has been performed with Definity.  Samuel Germany 10/21/2016, 12:24 PM

## 2016-10-26 ENCOUNTER — Encounter: Payer: Self-pay | Admitting: Internal Medicine

## 2016-10-29 ENCOUNTER — Other Ambulatory Visit: Payer: Self-pay | Admitting: Internal Medicine

## 2016-11-01 NOTE — Telephone Encounter (Signed)
Rx(s) sent to pharmacy electronically.  

## 2016-11-11 ENCOUNTER — Other Ambulatory Visit: Payer: Self-pay | Admitting: Internal Medicine

## 2016-11-11 NOTE — Telephone Encounter (Signed)
Rx request sent to pharmacy.  

## 2016-11-14 ENCOUNTER — Telehealth: Payer: Self-pay | Admitting: Internal Medicine

## 2016-11-14 DIAGNOSIS — L0390 Cellulitis, unspecified: Secondary | ICD-10-CM | POA: Diagnosis not present

## 2016-11-14 DIAGNOSIS — G8191 Hemiplegia, unspecified affecting right dominant side: Secondary | ICD-10-CM | POA: Diagnosis not present

## 2016-11-14 DIAGNOSIS — L97512 Non-pressure chronic ulcer of other part of right foot with fat layer exposed: Secondary | ICD-10-CM | POA: Diagnosis not present

## 2016-11-14 DIAGNOSIS — E1121 Type 2 diabetes mellitus with diabetic nephropathy: Secondary | ICD-10-CM | POA: Diagnosis not present

## 2016-11-14 NOTE — Telephone Encounter (Signed)
Returned call to pharmacy.  She states patient was Rx'ed Ciprofloxacin by PCP and PCP told patient to cut amiodarone in half while taking Cipro. Pharmacy as concerned as both meds can prolong QT. Advised that patient should take amiodarone as Rx'ed by Dr. Debara Pickett and PCP should select a different antibiotic that will not interact with cardiac medications. She will contact PCP office.

## 2016-11-14 NOTE — Telephone Encounter (Signed)
New Message  Pt c/o medication issue:  1. Name of Medication: amiodarone (Pacerone) 200 mg tablet once daily cipro 500 mg but not in med list  2. How are you currently taking this medication (dosage and times per day)? See above  3. Are you having a reaction (difficulty breathing--STAT)? N/A  4. What is your medication issue? Pts pharmacist voiced pt was informed to cut medication on our med list amiodarone in half and pharmacist wants to know why not cut out completely and it may cause an interaction.

## 2016-11-29 DIAGNOSIS — L02612 Cutaneous abscess of left foot: Secondary | ICD-10-CM | POA: Diagnosis not present

## 2016-11-29 DIAGNOSIS — L89892 Pressure ulcer of other site, stage 2: Secondary | ICD-10-CM | POA: Diagnosis not present

## 2016-11-29 DIAGNOSIS — E1151 Type 2 diabetes mellitus with diabetic peripheral angiopathy without gangrene: Secondary | ICD-10-CM | POA: Diagnosis not present

## 2016-11-29 DIAGNOSIS — E1140 Type 2 diabetes mellitus with diabetic neuropathy, unspecified: Secondary | ICD-10-CM | POA: Diagnosis not present

## 2016-12-05 DIAGNOSIS — E1140 Type 2 diabetes mellitus with diabetic neuropathy, unspecified: Secondary | ICD-10-CM | POA: Diagnosis not present

## 2016-12-05 DIAGNOSIS — L89892 Pressure ulcer of other site, stage 2: Secondary | ICD-10-CM | POA: Diagnosis not present

## 2016-12-05 DIAGNOSIS — E1151 Type 2 diabetes mellitus with diabetic peripheral angiopathy without gangrene: Secondary | ICD-10-CM | POA: Diagnosis not present

## 2016-12-06 DIAGNOSIS — R201 Hypoesthesia of skin: Secondary | ICD-10-CM | POA: Diagnosis not present

## 2016-12-06 DIAGNOSIS — E1140 Type 2 diabetes mellitus with diabetic neuropathy, unspecified: Secondary | ICD-10-CM | POA: Diagnosis not present

## 2016-12-06 DIAGNOSIS — G894 Chronic pain syndrome: Secondary | ICD-10-CM | POA: Diagnosis not present

## 2016-12-12 ENCOUNTER — Observation Stay (HOSPITAL_COMMUNITY): Payer: Medicare Other

## 2016-12-12 ENCOUNTER — Observation Stay (HOSPITAL_COMMUNITY): Admit: 2016-12-12 | Payer: Medicare Other

## 2016-12-12 ENCOUNTER — Encounter (HOSPITAL_COMMUNITY): Payer: Self-pay

## 2016-12-12 ENCOUNTER — Inpatient Hospital Stay (HOSPITAL_COMMUNITY)
Admission: EM | Admit: 2016-12-12 | Discharge: 2016-12-14 | DRG: 638 | Disposition: A | Discharge status: Home / Self Care | Payer: Medicare Other | Attending: Internal Medicine | Admitting: Internal Medicine

## 2016-12-12 ENCOUNTER — Emergency Department (HOSPITAL_COMMUNITY): Payer: Medicare Other

## 2016-12-12 DIAGNOSIS — E1140 Type 2 diabetes mellitus with diabetic neuropathy, unspecified: Secondary | ICD-10-CM | POA: Diagnosis not present

## 2016-12-12 DIAGNOSIS — Z794 Long term (current) use of insulin: Secondary | ICD-10-CM

## 2016-12-12 DIAGNOSIS — Z8249 Family history of ischemic heart disease and other diseases of the circulatory system: Secondary | ICD-10-CM | POA: Diagnosis not present

## 2016-12-12 DIAGNOSIS — N179 Acute kidney failure, unspecified: Secondary | ICD-10-CM | POA: Diagnosis present

## 2016-12-12 DIAGNOSIS — N183 Chronic kidney disease, stage 3 unspecified: Secondary | ICD-10-CM | POA: Diagnosis present

## 2016-12-12 DIAGNOSIS — Z952 Presence of prosthetic heart valve: Secondary | ICD-10-CM

## 2016-12-12 DIAGNOSIS — Z951 Presence of aortocoronary bypass graft: Secondary | ICD-10-CM

## 2016-12-12 DIAGNOSIS — Z87891 Personal history of nicotine dependence: Secondary | ICD-10-CM | POA: Diagnosis not present

## 2016-12-12 DIAGNOSIS — Z953 Presence of xenogenic heart valve: Secondary | ICD-10-CM | POA: Diagnosis not present

## 2016-12-12 DIAGNOSIS — I480 Paroxysmal atrial fibrillation: Secondary | ICD-10-CM | POA: Diagnosis present

## 2016-12-12 DIAGNOSIS — Z833 Family history of diabetes mellitus: Secondary | ICD-10-CM

## 2016-12-12 DIAGNOSIS — E1151 Type 2 diabetes mellitus with diabetic peripheral angiopathy without gangrene: Secondary | ICD-10-CM | POA: Diagnosis present

## 2016-12-12 DIAGNOSIS — I639 Cerebral infarction, unspecified: Secondary | ICD-10-CM | POA: Diagnosis present

## 2016-12-12 DIAGNOSIS — E039 Hypothyroidism, unspecified: Secondary | ICD-10-CM | POA: Diagnosis present

## 2016-12-12 DIAGNOSIS — L03116 Cellulitis of left lower limb: Secondary | ICD-10-CM | POA: Diagnosis present

## 2016-12-12 DIAGNOSIS — E11621 Type 2 diabetes mellitus with foot ulcer: Secondary | ICD-10-CM | POA: Diagnosis not present

## 2016-12-12 DIAGNOSIS — E08621 Diabetes mellitus due to underlying condition with foot ulcer: Secondary | ICD-10-CM

## 2016-12-12 DIAGNOSIS — I2510 Atherosclerotic heart disease of native coronary artery without angina pectoris: Secondary | ICD-10-CM | POA: Diagnosis present

## 2016-12-12 DIAGNOSIS — R600 Localized edema: Secondary | ICD-10-CM | POA: Diagnosis not present

## 2016-12-12 DIAGNOSIS — L97529 Non-pressure chronic ulcer of other part of left foot with unspecified severity: Secondary | ICD-10-CM | POA: Diagnosis not present

## 2016-12-12 DIAGNOSIS — Z8673 Personal history of transient ischemic attack (TIA), and cerebral infarction without residual deficits: Secondary | ICD-10-CM | POA: Diagnosis not present

## 2016-12-12 DIAGNOSIS — K219 Gastro-esophageal reflux disease without esophagitis: Secondary | ICD-10-CM | POA: Diagnosis present

## 2016-12-12 DIAGNOSIS — I5040 Unspecified combined systolic (congestive) and diastolic (congestive) heart failure: Secondary | ICD-10-CM | POA: Diagnosis present

## 2016-12-12 DIAGNOSIS — I130 Hypertensive heart and chronic kidney disease with heart failure and stage 1 through stage 4 chronic kidney disease, or unspecified chronic kidney disease: Secondary | ICD-10-CM | POA: Diagnosis not present

## 2016-12-12 DIAGNOSIS — E11622 Type 2 diabetes mellitus with other skin ulcer: Secondary | ICD-10-CM

## 2016-12-12 DIAGNOSIS — E119 Type 2 diabetes mellitus without complications: Secondary | ICD-10-CM

## 2016-12-12 DIAGNOSIS — L89892 Pressure ulcer of other site, stage 2: Secondary | ICD-10-CM | POA: Diagnosis not present

## 2016-12-12 DIAGNOSIS — E13621 Other specified diabetes mellitus with foot ulcer: Secondary | ICD-10-CM

## 2016-12-12 DIAGNOSIS — L97509 Non-pressure chronic ulcer of other part of unspecified foot with unspecified severity: Secondary | ICD-10-CM

## 2016-12-12 DIAGNOSIS — I255 Ischemic cardiomyopathy: Secondary | ICD-10-CM | POA: Diagnosis present

## 2016-12-12 DIAGNOSIS — E1122 Type 2 diabetes mellitus with diabetic chronic kidney disease: Secondary | ICD-10-CM | POA: Diagnosis not present

## 2016-12-12 DIAGNOSIS — I5042 Chronic combined systolic (congestive) and diastolic (congestive) heart failure: Secondary | ICD-10-CM | POA: Diagnosis present

## 2016-12-12 DIAGNOSIS — L97309 Non-pressure chronic ulcer of unspecified ankle with unspecified severity: Secondary | ICD-10-CM

## 2016-12-12 DIAGNOSIS — E785 Hyperlipidemia, unspecified: Secondary | ICD-10-CM | POA: Diagnosis present

## 2016-12-12 DIAGNOSIS — L97429 Non-pressure chronic ulcer of left heel and midfoot with unspecified severity: Secondary | ICD-10-CM | POA: Diagnosis not present

## 2016-12-12 DIAGNOSIS — F419 Anxiety disorder, unspecified: Secondary | ICD-10-CM | POA: Diagnosis present

## 2016-12-12 DIAGNOSIS — I4891 Unspecified atrial fibrillation: Secondary | ICD-10-CM | POA: Diagnosis present

## 2016-12-12 LAB — CBC WITH DIFFERENTIAL/PLATELET
BASOS PCT: 0 %
Basophils Absolute: 0 10*3/uL (ref 0.0–0.1)
EOS ABS: 0.1 10*3/uL (ref 0.0–0.7)
EOS PCT: 1 %
HCT: 40.3 % (ref 39.0–52.0)
HEMOGLOBIN: 13.7 g/dL (ref 13.0–17.0)
LYMPHS ABS: 1.3 10*3/uL (ref 0.7–4.0)
Lymphocytes Relative: 16 %
MCH: 29.8 pg (ref 26.0–34.0)
MCHC: 34 g/dL (ref 30.0–36.0)
MCV: 87.8 fL (ref 78.0–100.0)
MONO ABS: 0.9 10*3/uL (ref 0.1–1.0)
MONOS PCT: 11 %
NEUTROS PCT: 72 %
Neutro Abs: 5.8 10*3/uL (ref 1.7–7.7)
PLATELETS: 242 10*3/uL (ref 150–400)
RBC: 4.59 MIL/uL (ref 4.22–5.81)
RDW: 12.7 % (ref 11.5–15.5)
WBC: 8.1 10*3/uL (ref 4.0–10.5)

## 2016-12-12 LAB — COMPREHENSIVE METABOLIC PANEL
ALBUMIN: 3.8 g/dL (ref 3.5–5.0)
ALK PHOS: 48 U/L (ref 38–126)
ALT: 29 U/L (ref 17–63)
AST: 29 U/L (ref 15–41)
Anion gap: 11 (ref 5–15)
BILIRUBIN TOTAL: 0.4 mg/dL (ref 0.3–1.2)
BUN: 26 mg/dL — AB (ref 6–20)
CHLORIDE: 102 mmol/L (ref 101–111)
CO2: 23 mmol/L (ref 22–32)
CREATININE: 1.88 mg/dL — AB (ref 0.61–1.24)
Calcium: 9 mg/dL (ref 8.9–10.3)
GFR calc Af Amer: 41 mL/min — ABNORMAL LOW (ref >60)
GFR, EST NON AFRICAN AMERICAN: 35 mL/min — AB (ref >60)
Glucose, Bld: 252 mg/dL — ABNORMAL HIGH (ref 65–99)
Potassium: 4.4 mmol/L (ref 3.5–5.1)
SODIUM: 136 mmol/L (ref 135–145)
TOTAL PROTEIN: 7 g/dL (ref 6.5–8.1)

## 2016-12-12 LAB — GLUCOSE, CAPILLARY
GLUCOSE-CAPILLARY: 127 mg/dL — AB (ref 65–99)
Glucose-Capillary: 168 mg/dL — ABNORMAL HIGH (ref 65–99)

## 2016-12-12 LAB — C-REACTIVE PROTEIN: CRP: 4.7 mg/dL — ABNORMAL HIGH (ref <1.0)

## 2016-12-12 LAB — PREALBUMIN: PREALBUMIN: 21.5 mg/dL (ref 18–38)

## 2016-12-12 LAB — SEDIMENTATION RATE: SED RATE: 60 mm/h — AB (ref 0–16)

## 2016-12-12 LAB — CBG MONITORING, ED: Glucose-Capillary: 154 mg/dL — ABNORMAL HIGH (ref 65–99)

## 2016-12-12 MED ORDER — FENOFIBRATE 160 MG PO TABS
160.0000 mg | ORAL_TABLET | Freq: Every day | ORAL | Status: DC
  Administered 2016-12-13 – 2016-12-14 (×2): 160 mg via ORAL
  Filled 2016-12-12 (×2): qty 1

## 2016-12-12 MED ORDER — ONDANSETRON HCL 4 MG/2ML IJ SOLN: 4.0000 mg | Freq: Four times a day (QID) | INTRAMUSCULAR | Status: DC | PRN

## 2016-12-12 MED ORDER — GABAPENTIN 300 MG PO CAPS
300.0000 mg | ORAL_CAPSULE | Freq: Two times a day (BID) | ORAL | Status: DC
  Administered 2016-12-12 – 2016-12-14 (×4): 300 mg via ORAL
  Filled 2016-12-12 (×5): qty 1

## 2016-12-12 MED ORDER — APIXABAN 5 MG PO TABS
5.0000 mg | ORAL_TABLET | Freq: Two times a day (BID) | ORAL | Status: DC
  Administered 2016-12-12 – 2016-12-14 (×4): 5 mg via ORAL
  Filled 2016-12-12 (×4): qty 1

## 2016-12-12 MED ORDER — SODIUM CHLORIDE 0.9 % IV SOLN
INTRAVENOUS | Status: DC
  Administered 2016-12-12: 17:00:00 via INTRAVENOUS

## 2016-12-12 MED ORDER — SODIUM CHLORIDE 0.9 % IV BOLUS (SEPSIS)
500.0000 mL | Freq: Once | INTRAVENOUS | Status: AC
  Administered 2016-12-12: 500 mL via INTRAVENOUS

## 2016-12-12 MED ORDER — ONDANSETRON HCL 4 MG PO TABS: 4.0000 mg | ORAL_TABLET | Freq: Four times a day (QID) | ORAL | Status: DC | PRN

## 2016-12-12 MED ORDER — INSULIN ASPART 100 UNIT/ML ~~LOC~~ SOLN
3.0000 [IU] | Freq: Three times a day (TID) | SUBCUTANEOUS | Status: DC
  Administered 2016-12-12 – 2016-12-14 (×5): 3 [IU] via SUBCUTANEOUS

## 2016-12-12 MED ORDER — DEXTROSE 5 % IV SOLN
2.0000 g | INTRAVENOUS | Status: DC
  Administered 2016-12-12 – 2016-12-13 (×2): 2 g via INTRAVENOUS
  Filled 2016-12-12 (×3): qty 2

## 2016-12-12 MED ORDER — DOCUSATE SODIUM 100 MG PO CAPS
100.0000 mg | ORAL_CAPSULE | Freq: Two times a day (BID) | ORAL | Status: DC
  Administered 2016-12-12 – 2016-12-14 (×4): 100 mg via ORAL
  Filled 2016-12-12 (×4): qty 1

## 2016-12-12 MED ORDER — LEVOTHYROXINE SODIUM 25 MCG PO TABS
25.0000 ug | ORAL_TABLET | Freq: Every day | ORAL | Status: DC
  Administered 2016-12-13 – 2016-12-14 (×2): 25 ug via ORAL
  Filled 2016-12-12 (×2): qty 1

## 2016-12-12 MED ORDER — METRONIDAZOLE 500 MG PO TABS: 500.0000 mg | ORAL_TABLET | Freq: Three times a day (TID) | ORAL | Status: DC

## 2016-12-12 MED ORDER — AMIODARONE HCL 200 MG PO TABS
200.0000 mg | ORAL_TABLET | Freq: Every day | ORAL | Status: DC
  Administered 2016-12-13 – 2016-12-14 (×2): 200 mg via ORAL
  Filled 2016-12-12 (×2): qty 1

## 2016-12-12 MED ORDER — ACETAMINOPHEN 500 MG PO TABS: 1000.0000 mg | ORAL_TABLET | Freq: Two times a day (BID) | ORAL | Status: DC | PRN

## 2016-12-12 MED ORDER — MECLIZINE HCL 12.5 MG PO TABS: 25.0000 mg | ORAL_TABLET | Freq: Three times a day (TID) | ORAL | Status: DC | PRN

## 2016-12-12 MED ORDER — CLINDAMYCIN PHOSPHATE 600 MG/50ML IV SOLN
600.0000 mg | Freq: Three times a day (TID) | INTRAVENOUS | Status: DC
  Administered 2016-12-12 – 2016-12-13 (×3): 600 mg via INTRAVENOUS
  Filled 2016-12-12 (×6): qty 50

## 2016-12-12 MED ORDER — ATORVASTATIN CALCIUM 20 MG PO TABS
20.0000 mg | ORAL_TABLET | Freq: Every day | ORAL | Status: DC
  Administered 2016-12-12 – 2016-12-13 (×2): 20 mg via ORAL
  Filled 2016-12-12: qty 1
  Filled 2016-12-12: qty 2

## 2016-12-12 MED ORDER — INSULIN GLARGINE 100 UNIT/ML ~~LOC~~ SOLN
30.0000 [IU] | Freq: Two times a day (BID) | SUBCUTANEOUS | Status: DC
  Administered 2016-12-12 – 2016-12-14 (×4): 30 [IU] via SUBCUTANEOUS
  Filled 2016-12-12 (×6): qty 0.3

## 2016-12-12 MED ORDER — VANCOMYCIN HCL IN DEXTROSE 1-5 GM/200ML-% IV SOLN
1000.0000 mg | Freq: Once | INTRAVENOUS | Status: AC
  Administered 2016-12-12: 1000 mg via INTRAVENOUS
  Filled 2016-12-12: qty 200

## 2016-12-12 MED ORDER — PANTOPRAZOLE SODIUM 40 MG PO TBEC
40.0000 mg | DELAYED_RELEASE_TABLET | Freq: Two times a day (BID) | ORAL | Status: DC
  Administered 2016-12-12 – 2016-12-14 (×4): 40 mg via ORAL
  Filled 2016-12-12 (×4): qty 1

## 2016-12-12 MED ORDER — ALPRAZOLAM 1 MG PO TABS: 1.0000 mg | ORAL_TABLET | Freq: Three times a day (TID) | ORAL | Status: DC | PRN

## 2016-12-12 MED ORDER — FERROUS SULFATE 325 (65 FE) MG PO TABS
325.0000 mg | ORAL_TABLET | Freq: Every day | ORAL | Status: DC
  Administered 2016-12-13 – 2016-12-14 (×2): 325 mg via ORAL
  Filled 2016-12-12 (×2): qty 1

## 2016-12-12 MED ORDER — INSULIN ASPART 100 UNIT/ML ~~LOC~~ SOLN
0.0000 [IU] | Freq: Three times a day (TID) | SUBCUTANEOUS | Status: DC
  Administered 2016-12-12: 2 [IU] via SUBCUTANEOUS
  Administered 2016-12-13: 5 [IU] via SUBCUTANEOUS
  Administered 2016-12-13: 3 [IU] via SUBCUTANEOUS
  Administered 2016-12-13 – 2016-12-14 (×2): 5 [IU] via SUBCUTANEOUS

## 2016-12-12 MED ORDER — METOPROLOL SUCCINATE ER 25 MG PO TB24
25.0000 mg | ORAL_TABLET | Freq: Every day | ORAL | Status: DC
  Administered 2016-12-13 – 2016-12-14 (×2): 25 mg via ORAL
  Filled 2016-12-12 (×2): qty 1

## 2016-12-12 NOTE — ED Notes (Signed)
Pt in ultrasound.  Will transport to floor when he returns.

## 2016-12-12 NOTE — ED Provider Notes (Signed)
[facility] DEPT Provider Note   CSN: [id number] Arrival date & time: 12/12/16  1214     History   Chief Complaint Chief Complaint  Patient presents with  . Foot Ulcer    HPI [name] is a 71 y.o. male.  HPI Patient referred to the emergency department by his primary physician for an infected foot ulcer. Patient states he's had an ulcer to the bottom of his left foot for several months. Then on a course of antibiotics. Has had increased swelling and redness over the last couple of weeks. He denies any fever or chills. Denies any known injury. States he's been compliant with his medication. Past Medical History:  Diagnosis Date  . At risk for sudden cardiac death June 25, 2013  . Cataract   . Chronic anticoagulation 05/2013   started  . Chronic kidney disease    Patient reports he is seeing Kidney doctor in September  . Coronary artery disease   . Diabetes mellitus    type 2   . Dyslipidemia   . GERD (gastroesophageal reflux disease)   . History of valve replacement 2010   [facility] Ease bioprosthetic 94mm  . Hypertension   . PAF (paroxysmal atrial fibrillation) ([place]) 05/2013   converted with amiodarone to SR  . S/P CABG x 3 2010  . S/P cardiac catheterization, Rt & Lt heart cath 06/05/2013 Jun 25, 2013  . Stroke [facility]) 2003   R-sided weakness & upper extremity swelling   . Thyroid disease     Patient Active Problem List   Diagnosis Date Noted  . Nausea & vomiting 02/24/2016  . A-fib ([place]) 02/23/2016  . Hypothyroidism 02/23/2016  . CKD (chronic kidney disease), stage III 02/23/2016  . Anxiety 02/23/2016  . Chronic combined systolic (congestive) and diastolic (congestive) heart failure ([place]) 02/23/2016  . Dizziness 02/23/2016  . Open wound of right foot 02/23/2016  . Rectal bleeding 07/28/2015  . Chronic anticoagulation 07/28/2015  . PVD (peripheral vascular disease) ([place])   . S/P angioplasty 04/23/2015  . Peripheral arterial occlusive disease ([place])  04/23/2015  . PAD (peripheral artery disease) ([place])   . Critical lower limb ischemia   . Toe ulcer, right ([place])   . Type II or unspecified type diabetes mellitus without mention of complication, uncontrolled 11/27/2013  . Acute respiratory failure with hypoxia ([place]) 11/26/2013  . CAP (community acquired pneumonia) 11/26/2013  . CHF exacerbation ([place]) 11/25/2013  . Cardiomyopathy, ischemic 06/13/2013  . At risk for sudden cardiac death 06-25-2013  . S/P cardiac catheterization, Rt & Lt heart cath 06/05/2013 06-25-13  . Acute on chronic combined systolic and diastolic HF (heart failure), NYHA class 3 ([place]) 06/04/2013  . Atrial flutter with rapid ventricular response ([place]) 06/02/2013  . Acute renal failure: Cr up to 1.8 06/02/2013  . Hyperkalemia 06/02/2013    Class: Acute  . LBBB (left bundle branch block) 06/01/2013  . Hypotension- secondary to AF and Diltiazem Rx 06/01/2013  . Obesity (BMI 30-39.9) 06/01/2013  . Acute on chronic diastolic heart failure - due to Afib AVR 06/01/2013  . Atrial fibrillation with RVR- converted with Amiodarone 05/31/2013  . Stroke- Lt brain 2003 04/08/2013  . Hemiplegia affecting right dominant side ([place]) 04/08/2013  . Cataract 04/08/2013  . GERD (gastroesophageal reflux disease) 04/08/2013  . S/P CABG x 3 - 2010 04/08/2013  . S/P AVR-tissue, 2010 04/08/2013  . DM2 (diabetes mellitus, type 2) ([place]) 04/08/2013  . Dyslipidemia 04/08/2013  . SUBACROMIAL BURSITIS, LEFT 11/05/2009    Past Surgical History:  Procedure Laterality Date  . AORTIC VALVE REPLACEMENT  01/26/2009   [facility] Ease bioprosthetic 16mm valve  . CARDIAC CATHETERIZATION  06/05/2013   native 3 vessel disease; patent LIMA to LAD, SVG to OM and SVG to PDA; Elevated right and left heart filling pressures, with predominantly pulmonary venous hypertension, normal PVR  . COLONOSCOPY  2007   Dr. Aviva Signs: internal hemorrhoids  . COLONOSCOPY N/A 06/16/2016   Procedure: COLONOSCOPY;   Surgeon: Rogene Houston, MD;  Location[facility] ENDO [facility]ITE;  Service: Endoscopy;  Laterality: N/A;  1:45  . CORONARY ARTERY BYPASS GRAFT  01/26/2009   LIMA to LAD, SVG to circumflex, SVG to PDA  . GRAFT(S) ANGIOGRAM  06/05/2013   Procedure: GRAFT(S) Cyril Loosen;  Surgeon: Leonie Man, MD;  Location: [facility] C[facility] LAB;  Service: Cardiovascular;;  . IR GENERIC HISTORICAL  05/12/2016   IR RADIOLOGIST EVAL & MGMT 05/12/2016 Jacqulynn Cadet, MD [facility] INTERV RAD  . LEFT AND RIGHT HEART CATHETERIZATION WITH CORONARY ANGIOGRAM N/A 06/05/2013   Procedure: LEFT AND RIGHT HEART CATHETERIZATION WITH CORONARY ANGIOGRAM;  Surgeon: Leonie Man, MD;  Location: [facility] CATH LAB;  Service: Cardiovascular;  Laterality: N/A;  . TRANSTHORACIC ECHOCARDIOGRAM  09/2011   grade 1 diastolic dysfunction; increasing valve gradient; calcified MV annulus   . TRANSTHORACIC ECHOCARDIOGRAM  06/03/2013   EF 25-30%, mod conc. hypertrophy, grade 3 diastolic dysfunction; LA mod dilated; calcified MV annulus; transaortic gradients are normal for the bioprosthetic valve; inf vena cava dilated (elevated CVP) - LifeVest       Home Medications    Prior to Admission medications   Medication Sig Start Date End Date Taking? Authorizing Provider  acetaminophen (TYLENOL) 500 MG tablet Take 1,000 mg by mouth 2 (two) times daily as needed for headache. For pain/headaches   Yes Historical Provider, MD  alprazolam Duanne Moron) 2 MG tablet Take 1 mg by mouth 3 (three) times daily.    Yes Historical Provider, MD  amiodarone (PACERONE) 200 MG tablet Take 1 tablet (200 mg total) by mouth daily. 10/14/16  Yes Pixie Casino, MD  apixaban (ELIQUIS) 5 MG TABS tablet Take 1 tablet (5 mg total) by mouth 2 (two) times daily. 10/14/16  Yes Pixie Casino, MD  atorvastatin (LIPITOR) 20 MG tablet Take 1 tablet (20 mg total) by mouth daily at 6 PM. 06/18/16  Yes Rogene Houston, MD  docusate sodium (COLACE) 100 MG capsule Take 100 mg by mouth 2 (two) times daily.   Yes  Historical Provider, MD  fenofibrate (TRICOR) 145 MG tablet TAKE ONE TABLET EACH DAY. 11/01/16  Yes Pixie Casino, MD  ferrous sulfate 325 (65 FE) MG EC tablet Take 325 mg by mouth daily with breakfast.   Yes Historical Provider, MD  gabapentin (NEURONTIN) 300 MG capsule Take 300 mg by mouth 2 (two) times daily.    Yes Historical Provider, MD  insulin glargine (LANTUS) 100 UNIT/ML injection Inject 0.6-0.75 mLs (60-75 Units total) into the skin 2 (two) times daily. 75 units in the morning then 60 units at bedtime 02/25/16  Yes Modena Jansky, MD  insulin lispro (HUMALOG) 100 UNIT/ML injection Inject 0.35-0.5 mLs (35-50 Units total) into the skin 3 (three) times daily before meals. Patient states that he takes 35 units at breakfast, 50 units at dinner and 50 units at supper. 02/25/16  Yes Modena Jansky, MD  INVOKANA 100 MG TABS tablet Take 1 tablet by mouth daily. 12/09/16  Yes Historical Provider, MD  levothyroxine (SYNTHROID, [place]) 25  MCG tablet Take 25 mcg by mouth daily before breakfast.   Yes Historical Provider, MD  meclizine (ANTIVERT) 25 MG tablet Take 25 mg by mouth 3 (three) times daily as needed for dizziness.   Yes Historical Provider, MD  metoprolol succinate (TOPROL-XL) 25 MG 24 hr tablet Take 1 tablet (25 mg total) by mouth daily. 10/14/16  Yes Pixie Casino, MD  Omega-3 Fatty Acids (FISH OIL) 1200 MG CAPS Take by mouth.   Yes Historical Provider, MD  pantoprazole (PROTONIX) 40 MG tablet Take 1 tablet (40 mg total) by mouth 2 (two) times daily. 10/30/14  Yes Pixie Casino, MD  torsemide (DEMADEX) 10 MG tablet Take 1 tablet (10 mg total) by mouth daily. 10/14/16  Yes Pixie Casino, MD  ELIQUIS 5 MG TABS tablet TAKE ONE TABLTT BY MOUTH TWICE A DAY. Patient not taking: Reported on 12/12/2016 11/11/16   Pixie Casino, MD    Family History Family History  Problem Relation Age of Onset  . Heart attack Mother   . Liver disease Brother   . Diabetes Sister     x4    Social  History Social History  Substance Use Topics  . Smoking status: Former Smoker    Types: Cigarettes    Quit date: 08/15/2001  . Smokeless tobacco: Former Systems developer    Types: Chew     Comment: Quit in 2003  . Alcohol use No     Allergies   Patient has no known allergies.   Review of Systems Review of Systems  Constitutional: Negative for chills, fatigue and fever.  Respiratory: Negative for shortness of breath.   Cardiovascular: Positive for leg swelling. Negative for chest pain and palpitations.  Gastrointestinal: Negative for abdominal pain, diarrhea, nausea and vomiting.  Musculoskeletal: Negative for back pain and myalgias.  Skin: Positive for rash.  Neurological: Negative for dizziness, weakness, light-headedness, numbness and headaches.  All other systems reviewed and are negative.    Physical Exam Updated Vital Signs BP (!) 131/59 (BP Location: Left Arm)   Pulse 87   Temp 98 F (36.7 C) (Oral)   Resp (!) 28   Ht 5\' 10"  (1.778 m)   Wt 245 lb (111.1 kg)   SpO2 94%   BMI 35.15 kg/m   Physical Exam  Constitutional: He is oriented to person, place, and time. He appears well-developed and well-nourished.  HENT:  Head: Normocephalic and atraumatic.  Mouth/Throat: Oropharynx is clear and moist. No oropharyngeal exudate.  Eyes: EOM are normal. Pupils are equal, round, and reactive to light.  Neck: Normal range of motion. Neck supple.  Cardiovascular: Normal rate and regular rhythm.   Pulmonary/Chest: Effort normal and breath sounds normal.  Abdominal: Soft. Bowel sounds are normal. There is no tenderness. There is no rebound and no guarding.  Musculoskeletal: Normal range of motion. He exhibits tenderness. He exhibits no edema.  Patient has ulceration to the plantar surface of the left foot over the proximal first metatarsal. No drainage from the site. Patient has tenderness to palpation with erythema and swelling extending over the dorsum of the foot. Distal pulses are  intact. No streaking up the leg or inguinal lymphadenopathy.  Neurological: He is alert and oriented to person, place, and time.  Skin: Skin is warm and dry. Capillary refill takes less than 2 seconds. No rash noted. There is erythema.  Psychiatric: He has a normal mood and affect. His behavior is normal.  Nursing note and vitals reviewed.    ED Treatments /  Results  Labs (all labs ordered are listed, but only abnormal results are displayed) Labs Reviewed  COMPREHENSIVE METABOLIC PANEL - Abnormal; Notable for the following:       Result Value   Glucose, Bld 252 (*)    BUN 26 (*)    Creatinine, Ser 1.88 (*)    GFR calc non Af Amer 35 (*)    GFR calc Af Amer 41 (*)    All other components within normal limits  CBC WITH DIFFERENTIAL/PLATELET  SEDIMENTATION RATE  C-REACTIVE PROTEIN    EKG  EKG Interpretation None       Radiology Dg Foot Complete Left  Result Date: 12/12/2016 CLINICAL DATA:  Soft tissue ulcer EXAM: LEFT FOOT - COMPLETE 3+ VIEW COMPARISON:  None. FINDINGS: No acute fracture or dislocation is noted. Small calcaneal spurs are seen. Mild soft tissue swelling is noted. IMPRESSION: No acute bony abnormality noted. Electronically Signed   By: Inez Catalina M.D.   On: 12/12/2016 13:42    Procedures Procedures (including critical care time)  Medications Ordered in ED Medications  vancomycin (VANCOCIN) IVPB 1000 mg/200 mL premix (1,000 mg Intravenous New Bag/Given 12/12/16 1311)  sodium chloride 0.9 % bolus 500 mL (not administered)     Initial Impression / Assessment and Plan / ED Course  I have reviewed the triage vital signs and the nursing notes.  Pertinent labs & imaging results that were available during my care of the patient were reviewed by me and considered in my medical decision making (see chart for details).    Patient with infected diabetic foot ulcer. Concern for possible osteomyelitis. We'll treat with IV antibiotics. Anticipate  admission. Discussed with hospitalist and will admit. Final Clinical Impressions(s) / ED Diagnoses   Final diagnoses:  Diabetic ulcer of left midfoot associated with diabetes mellitus due to underlying condition, unspecified ulcer stage ([place])  AKI (acute kidney injury) [facility])    New Prescriptions New Prescriptions   No medications on file     Julianne Rice, MD 12/12/16 1409

## 2016-12-12 NOTE — ED Triage Notes (Signed)
Pt sent to ED by Dr Berline Lopes due to new ulcer to left side of foot. Foot is also red and warm to touch. Pt reports area has been there a month and red for at left 2 weeks

## 2016-12-12 NOTE — ED Notes (Signed)
Patient transported to MRI 

## 2016-12-12 NOTE — H&P (Signed)
History and Physical    LENWARD ABLE [facility]:[id number] DOB: [birth year] DOA: 12/12/2016  PCP: [name], MD  Patient coming from: Home.   I have personally briefly reviewed patient's old medical records in [place]  Chief Complaint: left foot worsening redness.   HPI: [name] is a 71 y.o. male with medical history significant of PVD, DM, stroke, Aortic valve replacement, PAF, on eliquis, CKD stage III last cr per records at 1.2. Who presents complaining of worsening redness left foot. He has chronic wound, small ulcer plantar aspect of firt metatarsal. He has been following  with PCP, doing local care, and was taking antibiotics. He presented to PCP office with worsening edema, redness and pain left foot. He denies nausea, vomiting or diarrhea. No fevers.   ED Course: Patient is afebrile, cr at 1.8, WBC at 8.1, CBG 252, ESR 60, foot x ray: no acute bony abnormalities.   Review of Systems: As per HPI otherwise 10 point review of systems negative.    Past Medical History:  Diagnosis Date  . At risk for sudden cardiac death 2013-06-18  . Cataract   . Chronic anticoagulation 05/2013   started  . Chronic kidney disease    Patient reports he is seeing Kidney doctor in September  . Coronary artery disease   . Diabetes mellitus    type 2   . Dyslipidemia   . GERD (gastroesophageal reflux disease)   . History of valve replacement 2010   [facility] Ease bioprosthetic 90m  . Hypertension   . PAF (paroxysmal [place] fibrillation) [place] 05/2013   converted with amiodarone to SR  . S/P CABG x 3 2010  . S/P cardiac catheterization, Rt & Lt heart cath 06/05/2013 12014/11/04 . Stroke ([facility] 2003   R-sided weakness & upper extremity swelling   . Thyroid disease     Past Surgical History:  Procedure Laterality Date  . [facility]  [facility]  . CARDIAC CATHETERIZATION  06/05/2013   native 3 vessel  disease; patent LIMA to LAD, SVG to OM and SVG to PDA; Elevated right and left heart filling pressures, with predominantly pulmonary venous hypertension, normal PVR  . COLONOSCOPY  2007   Dr. MaAviva Signsinternal hemorrhoids  . COLONOSCOPY N/A 06/16/2016   Procedure: COLONOSCOPY;  Surgeon: NaRogene HoustonMD;  Location[facility] SUITE;  Service: Endoscopy;  Laterality: N/A;  1:45  . CORONARY ARTERY BYPASS GRAFT  01/26/2009   LIMA to LAD, SVG to circumflex, SVG to PDA  . GRAFT(S) ANGIOGRAM  06/05/2013   Procedure: GRAFT(S) ANCyril Loosen Surgeon: DaLeonie ManMD;  [facility];  Service: Cardiovascular;;  . IR GENERIC HISTORICAL  05/12/2016   IR RADIOLOGIST EVAL & MGMT 05/12/2016 HeJacqulynn CadetMD [facility] INTERV RAD  . LEFT AND RIGHT HEART CATHETERIZATION WITH CORONARY ANGIOGRAM N/A 06/05/2013   Procedure: LEFT AND RIGHT HEART CATHETERIZATION WITH CORONARY ANGIOGRAM;  Surgeon: DaLeonie ManMD;  [facility];  Service: Cardiovascular;  Laterality: N/A;  . TRANSTHORACIC ECHOCARDIOGRAM  09/2011   grade 1 diastolic dysfunction; increasing valve gradient; calcified MV annulus   . TRANSTHORACIC ECHOCARDIOGRAM  06/03/2013   EF 25-30%, mod conc. hypertrophy, grade 3 diastolic dysfunction; LA mod dilated; calcified MV annulus; transaortic gradients are normal for the bioprosthetic valve; inf vena cava dilated (elevated CVP) - LifeVest     reports that he quit smoking about 15 years ago. His smoking  use included Cigarettes. He has quit using smokeless tobacco. His smokeless tobacco use included Chew. He reports that he does not drink alcohol or use drugs.  No Known Allergies  Family History  Problem Relation Age of Onset  . Heart attack Mother   . Liver disease Brother   . Diabetes Sister     x4     Prior to Admission medications   Medication Sig Start Date End Date Taking? Authorizing Provider  acetaminophen (TYLENOL) 500 MG tablet Take 1,000 mg by mouth 2 (two) times  daily as needed for headache. For pain/headaches   Yes Historical Provider, MD  alprazolam Duanne Moron) 2 MG tablet Take 1 mg by mouth 3 (three) times daily.    Yes Historical Provider, MD  amiodarone (PACERONE) 200 MG tablet Take 1 tablet (200 mg total) by mouth daily. 10/14/16  Yes Pixie Casino, MD  apixaban (ELIQUIS) 5 MG TABS tablet Take 1 tablet (5 mg total) by mouth 2 (two) times daily. 10/14/16  Yes Pixie Casino, MD  atorvastatin (LIPITOR) 20 MG tablet Take 1 tablet (20 mg total) by mouth daily at 6 PM. 06/18/16  Yes Rogene Houston, MD  docusate sodium (COLACE) 100 MG capsule Take 100 mg by mouth 2 (two) times daily.   Yes Historical Provider, MD  fenofibrate (TRICOR) 145 MG tablet TAKE ONE TABLET EACH DAY. 11/01/16  Yes Pixie Casino, MD  ferrous sulfate 325 (65 FE) MG EC tablet Take 325 mg by mouth daily with breakfast.   Yes Historical Provider, MD  gabapentin (NEURONTIN) 300 MG capsule Take 300 mg by mouth 2 (two) times daily.    Yes Historical Provider, MD  insulin glargine (LANTUS) 100 UNIT/ML injection Inject 0.6-0.75 mLs (60-75 Units total) into the skin 2 (two) times daily. 75 units in the morning then 60 units at bedtime 02/25/16  Yes Modena Jansky, MD  insulin lispro (HUMALOG) 100 UNIT/ML injection Inject 0.35-0.5 mLs (35-50 Units total) into the skin 3 (three) times daily before meals. Patient states that he takes 35 units at breakfast, 50 units at dinner and 50 units at supper. 02/25/16  Yes Modena Jansky, MD  INVOKANA 100 MG TABS tablet Take 1 tablet by mouth daily. 12/09/16  Yes Historical Provider, MD  levothyroxine (SYNTHROID, LEVOTHROID) 25 MCG tablet Take 25 mcg by mouth daily before breakfast.   Yes Historical Provider, MD  meclizine (ANTIVERT) 25 MG tablet Take 25 mg by mouth 3 (three) times daily as needed for dizziness.   Yes Historical Provider, MD  metoprolol succinate (TOPROL-XL) 25 MG 24 hr tablet Take 1 tablet (25 mg total) by mouth daily. 10/14/16  Yes Pixie Casino, MD  Omega-3 Fatty Acids (FISH OIL) 1200 MG CAPS Take by mouth.   Yes Historical Provider, MD  pantoprazole (PROTONIX) 40 MG tablet Take 1 tablet (40 mg total) by mouth 2 (two) times daily. 10/30/14  Yes Pixie Casino, MD  torsemide (DEMADEX) 10 MG tablet Take 1 tablet (10 mg total) by mouth daily. 10/14/16  Yes Pixie Casino, MD  ELIQUIS 5 MG TABS tablet TAKE ONE TABLTT BY MOUTH TWICE A DAY. Patient not taking: Reported on 12/12/2016 11/11/16   Pixie Casino, MD    Physical Exam: Vitals:   12/12/16 1300 12/12/16 1406 12/12/16 1408 12/12/16 1430  BP: 118/79 (!) 151/99  138/68  Pulse: 85  79 73  Resp:      Temp:      TempSrc:  SpO2: 94%  97% 98%  Weight:      Height:        Constitutional: NAD, calm, comfortable Vitals:   12/12/16 1300 12/12/16 1406 12/12/16 1408 12/12/16 1430  BP: 118/79 (!) 151/99  138/68  Pulse: 85  79 73  Resp:      Temp:      TempSrc:      SpO2: 94%  97% 98%  Weight:      Height:       Eyes: PERRL, lids and conjunctivae normal ENMT: Mucous membranes are moist. Posterior pharynx clear of any exudate or lesions.Normal dentition.  Neck: normal, supple, no masses, no thyromegaly Respiratory: clear to auscultation bilaterally, no wheezing, no crackles. Normal respiratory effort. No accessory muscle use.  Cardiovascular: Regular rate and rhythm, no murmurs / rubs / gallops. No extremity edema. 2+ pedal pulses. No carotid bruits.  Abdomen: no tenderness, no masses palpated. No hepatosplenomegaly. Bowel sounds positive.  Musculoskeletal: no clubbing / cyanosis. No joint deformity upper  extremities. Good ROM, no contractures. Normal muscle tone.  Right toe s/p tip amputation. Left LE with edema and erythema, warm left foot area first metatarsal anterior part of foot. He has very small open wound plantar aspect first metatarsal, no drainage,  Skin: see MSK exam.  Neurologic: CN 2-12 grossly intact. Sensation intact, DTR normal. Moves all 4 extremities.   Psychiatric: Normal judgment and insight. Alert and oriented x 3. Normal mood.     Labs on Admission: I have personally reviewed following labs and imaging studies  CBC:  Recent Labs Lab 12/12/16 1259  WBC 8.1  NEUTROABS 5.8  HGB 13.7  HCT 40.3  MCV 87.8  PLT 182   Basic Metabolic Panel:  Recent Labs Lab 12/12/16 1259  NA 136  K 4.4  CL 102  CO2 23  GLUCOSE 252*  BUN 26*  CREATININE 1.88*  CALCIUM 9.0   GFR: Estimated Creatinine Clearance: 46.9 mL/min (A) (by C-G formula based on SCr of 1.88 mg/dL (H)). Liver Function Tests:  Recent Labs Lab 12/12/16 1259  AST 29  ALT 29  ALKPHOS 48  BILITOT 0.4  PROT 7.0  ALBUMIN 3.8   No results for input(s): LIPASE, AMYLASE in the last 168 hours. No results for input(s): AMMONIA in the last 168 hours. Coagulation Profile: No results for input(s): INR, PROTIME in the last 168 hours. Cardiac Enzymes: No results for input(s): CKTOTAL, CKMB, CKMBINDEX, TROPONINI in the last 168 hours. BNP (last 3 results) No results for input(s): PROBNP in the last 8760 hours. HbA1C: No results for input(s): HGBA1C in the last 72 hours. CBG: No results for input(s): GLUCAP in the last 168 hours. Lipid Profile: No results for input(s): CHOL, HDL, LDLCALC, TRIG, CHOLHDL, LDLDIRECT in the last 72 hours. Thyroid Function Tests: No results for input(s): TSH, T4TOTAL, FREET4, T3FREE, THYROIDAB in the last 72 hours. Anemia Panel: No results for input(s): VITAMINB12, FOLATE, FERRITIN, TIBC, IRON, RETICCTPCT in the last 72 hours. Urine analysis:    Component Value Date/Time   COLORURINE STRAW (A) 02/23/2016 2140   APPEARANCEUR CLEAR 02/23/2016 2140   LABSPEC 1.023 02/23/2016 2140   PHURINE 7.0 02/23/2016 2140   GLUCOSEU >1000 (A) 02/23/2016 2140   HGBUR NEGATIVE 02/23/2016 2140   BILIRUBINUR NEGATIVE 02/23/2016 2140   KETONESUR NEGATIVE 02/23/2016 2140   PROTEINUR NEGATIVE 02/23/2016 2140   UROBILINOGEN 0.2 11/25/2013 1741    NITRITE NEGATIVE 02/23/2016 2140   LEUKOCYTESUR NEGATIVE 02/23/2016 2140    Radiological Exams on Admission:  Dg Foot Complete Left  Result Date: 12/12/2016 CLINICAL DATA:  Soft tissue ulcer EXAM: LEFT FOOT - COMPLETE 3+ VIEW COMPARISON:  None. FINDINGS: No acute fracture or dislocation is noted. Small calcaneal spurs are seen. Mild soft tissue swelling is noted. IMPRESSION: No acute bony abnormality noted. Electronically Signed   By: Inez Catalina M.D.   On: 12/12/2016 13:42      Assessment/Plan Active Problems:   Stroke- Lt brain 2003   S/P AVR-tissue, 2010   DM2 (diabetes mellitus, type 2) (HCC)   Acute renal failure: Cr up to 1.8   Cardiomyopathy, ischemic   A-fib (HCC)   CKD (chronic kidney disease), stage III   Chronic combined systolic (congestive) and diastolic (congestive) heart failure (HCC)   Cellulitis of left foot  1-left foot cellulitis, chronic wound; Admit for IV antibiotics.  Will start IV ceftriaxone and clindamycin. Will avoid vancomycin due to AKI.  Wound care consulted.  Check ABI.  Follow ESR and CRP/ might need to get MRI.   2-AKI on CKD stage III Gentle IV fluids.  Strict I and O.  Hold torsemide.  Repeat labs in am.  3-DM; continue with lantus but lower home dose.  Hold invokana.  SSI.   4-A fib; continue with eliquis and amiodarone.   4-CAD, cardiomyopathy;  Continue with lipitor, Metoprolol.   5-History of stroke; continue with eliquis.    DVT prophylaxis: Eliquis.  Code Status: Wishes to be full code.  Family Communication: care discussed with patient  Disposition Plan: Home in 24 to 48 hours depending on clinical condition.  Consults called: none Admission status: Observation, Med-Surgery.    Elmarie Shiley MD [facility] Pager [phone]  If 7PM-7AM, please contact night-coverage [email or web address] Password [email or web address]  12/12/2016, 3:10 PM

## 2016-12-13 DIAGNOSIS — E08621 Diabetes mellitus due to underlying condition with foot ulcer: Secondary | ICD-10-CM | POA: Diagnosis not present

## 2016-12-13 DIAGNOSIS — I5040 Unspecified combined systolic (congestive) and diastolic (congestive) heart failure: Secondary | ICD-10-CM | POA: Diagnosis present

## 2016-12-13 DIAGNOSIS — L97429 Non-pressure chronic ulcer of left heel and midfoot with unspecified severity: Secondary | ICD-10-CM

## 2016-12-13 DIAGNOSIS — Z8673 Personal history of transient ischemic attack (TIA), and cerebral infarction without residual deficits: Secondary | ICD-10-CM | POA: Diagnosis not present

## 2016-12-13 DIAGNOSIS — Z8249 Family history of ischemic heart disease and other diseases of the circulatory system: Secondary | ICD-10-CM | POA: Diagnosis not present

## 2016-12-13 DIAGNOSIS — Z87891 Personal history of nicotine dependence: Secondary | ICD-10-CM | POA: Diagnosis not present

## 2016-12-13 DIAGNOSIS — L03116 Cellulitis of left lower limb: Secondary | ICD-10-CM | POA: Diagnosis not present

## 2016-12-13 DIAGNOSIS — K219 Gastro-esophageal reflux disease without esophagitis: Secondary | ICD-10-CM | POA: Diagnosis present

## 2016-12-13 DIAGNOSIS — E039 Hypothyroidism, unspecified: Secondary | ICD-10-CM | POA: Diagnosis present

## 2016-12-13 DIAGNOSIS — N179 Acute kidney failure, unspecified: Secondary | ICD-10-CM | POA: Diagnosis not present

## 2016-12-13 DIAGNOSIS — I130 Hypertensive heart and chronic kidney disease with heart failure and stage 1 through stage 4 chronic kidney disease, or unspecified chronic kidney disease: Secondary | ICD-10-CM | POA: Diagnosis present

## 2016-12-13 DIAGNOSIS — E785 Hyperlipidemia, unspecified: Secondary | ICD-10-CM | POA: Diagnosis present

## 2016-12-13 DIAGNOSIS — Z951 Presence of aortocoronary bypass graft: Secondary | ICD-10-CM | POA: Diagnosis not present

## 2016-12-13 DIAGNOSIS — Z833 Family history of diabetes mellitus: Secondary | ICD-10-CM | POA: Diagnosis not present

## 2016-12-13 DIAGNOSIS — L97509 Non-pressure chronic ulcer of other part of unspecified foot with unspecified severity: Secondary | ICD-10-CM | POA: Diagnosis present

## 2016-12-13 DIAGNOSIS — I255 Ischemic cardiomyopathy: Secondary | ICD-10-CM | POA: Diagnosis present

## 2016-12-13 DIAGNOSIS — I2510 Atherosclerotic heart disease of native coronary artery without angina pectoris: Secondary | ICD-10-CM | POA: Diagnosis present

## 2016-12-13 DIAGNOSIS — Z953 Presence of xenogenic heart valve: Secondary | ICD-10-CM | POA: Diagnosis not present

## 2016-12-13 DIAGNOSIS — E11621 Type 2 diabetes mellitus with foot ulcer: Secondary | ICD-10-CM | POA: Diagnosis present

## 2016-12-13 DIAGNOSIS — Z794 Long term (current) use of insulin: Secondary | ICD-10-CM | POA: Diagnosis not present

## 2016-12-13 DIAGNOSIS — F419 Anxiety disorder, unspecified: Secondary | ICD-10-CM | POA: Diagnosis present

## 2016-12-13 DIAGNOSIS — I480 Paroxysmal atrial fibrillation: Secondary | ICD-10-CM | POA: Diagnosis present

## 2016-12-13 DIAGNOSIS — E1151 Type 2 diabetes mellitus with diabetic peripheral angiopathy without gangrene: Secondary | ICD-10-CM | POA: Diagnosis present

## 2016-12-13 DIAGNOSIS — N183 Chronic kidney disease, stage 3 (moderate): Secondary | ICD-10-CM | POA: Diagnosis present

## 2016-12-13 DIAGNOSIS — E1122 Type 2 diabetes mellitus with diabetic chronic kidney disease: Secondary | ICD-10-CM | POA: Diagnosis present

## 2016-12-13 LAB — CBC
HCT: 37.7 % — ABNORMAL LOW (ref 39.0–52.0)
Hemoglobin: 12.9 g/dL — ABNORMAL LOW (ref 13.0–17.0)
MCH: 30.1 pg (ref 26.0–34.0)
MCHC: 34.2 g/dL (ref 30.0–36.0)
MCV: 88.1 fL (ref 78.0–100.0)
Platelets: 244 10*3/uL (ref 150–400)
RBC: 4.28 MIL/uL (ref 4.22–5.81)
RDW: 12.7 % (ref 11.5–15.5)
WBC: 7.7 10*3/uL (ref 4.0–10.5)

## 2016-12-13 LAB — HIV ANTIBODY (ROUTINE TESTING W REFLEX): HIV SCREEN 4TH GENERATION: NONREACTIVE

## 2016-12-13 LAB — BASIC METABOLIC PANEL
ANION GAP: 9 (ref 5–15)
BUN: 21 mg/dL — AB (ref 6–20)
CALCIUM: 8.7 mg/dL — AB (ref 8.9–10.3)
CO2: 24 mmol/L (ref 22–32)
Chloride: 101 mmol/L (ref 101–111)
Creatinine, Ser: 1.5 mg/dL — ABNORMAL HIGH (ref 0.61–1.24)
GFR calc Af Amer: 53 mL/min — ABNORMAL LOW (ref >60)
GFR, EST NON AFRICAN AMERICAN: 46 mL/min — AB (ref >60)
GLUCOSE: 156 mg/dL — AB (ref 65–99)
POTASSIUM: 4.3 mmol/L (ref 3.5–5.1)
SODIUM: 134 mmol/L — AB (ref 135–145)

## 2016-12-13 LAB — GLUCOSE, CAPILLARY
GLUCOSE-CAPILLARY: 167 mg/dL — AB (ref 65–99)
GLUCOSE-CAPILLARY: 223 mg/dL — AB (ref 65–99)
GLUCOSE-CAPILLARY: 212 mg/dL — AB (ref 65–99)
Glucose-Capillary: 212 mg/dL — ABNORMAL HIGH (ref 65–99)
Glucose-Capillary: 250 mg/dL — ABNORMAL HIGH (ref 65–99)

## 2016-12-13 LAB — HEMOGLOBIN A1C
Hgb A1c MFr Bld: 8.1 % — ABNORMAL HIGH (ref 4.8–5.6)
MEAN PLASMA GLUCOSE: 186 mg/dL

## 2016-12-13 MED ORDER — MUPIROCIN 2 % EX OINT
TOPICAL_OINTMENT | Freq: Two times a day (BID) | CUTANEOUS | Status: DC
  Administered 2016-12-13 – 2016-12-14 (×3): via TOPICAL
  Filled 2016-12-13: qty 22

## 2016-12-13 MED ORDER — INSULIN ASPART 100 UNIT/ML ~~LOC~~ SOLN
5.0000 [IU] | Freq: Once | SUBCUTANEOUS | Status: AC
  Administered 2016-12-14: 5 [IU] via SUBCUTANEOUS

## 2016-12-13 MED ORDER — METRONIDAZOLE IN NACL 5-0.79 MG/ML-% IV SOLN
500.0000 mg | Freq: Three times a day (TID) | INTRAVENOUS | Status: DC
  Administered 2016-12-13 – 2016-12-14 (×4): 500 mg via INTRAVENOUS
  Filled 2016-12-13 (×4): qty 100

## 2016-12-13 NOTE — Progress Notes (Addendum)
Initial Nutrition Assessment  DOCUMENTATION CODES:   Obesity unspecified  INTERVENTION:  Heart healthy/ CHO modified diet  Encourage consistent, high quality protein intake   NUTRITION DIAGNOSIS:   Increased nutrient needs related to wound healing as evidenced by estimated needs.   GOAL:   Patient will meet greater than or equal to 90% of their needs   MONITOR:   PO intake, Labs, Weight trends, Skin  REASON FOR ASSESSMENT:   Consult Wound healing  ASSESSMENT: The patient presents with cellulitis left foot. 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 He has hx of DM, CKD-3, PVD (s/p stents).  He reports three meals daily and has a  good appetite. He reports consistent intake of protein foods at each meal. At breakfast he ate 100% of meal.  Weight hx shows intentional decrease over 6+ months. The patient says he has cut out some of his snacking.   Nutrition-Focused physical exam findings: WDL  Recent Labs Lab 12/12/16 1259 12/13/16 0534  NA 136 134*  K 4.4 4.3  CL 102 101  CO2 23 24  BUN 26* 21*  CREATININE 1.88* 1.50*  CALCIUM 9.0 8.7*  GLUCOSE 252* 156*   Labs and meds reviewed. /30/18- A1C- 8.1% which is improved from 7 /2017- 9.0%.  Diet Order:  Diet Carb Modified Fluid consistency: Thin; Room service appropriate? Yes  Skin:   (diabetic left upper foot, redness also noted per RN)  Last BM:  4/30  Height:   Ht Readings from Last 1 Encounters:  12/12/16 5\' 10"  (1.778 m)    Weight:   Wt Readings from Last 1 Encounters:  12/13/16 244 lb 4.3 oz (110.8 kg)    Ideal Body Weight:  75 kg  BMI:  Body mass index is 35.05 kg/m.  Estimated Nutritional Needs:   Kcal:  9675-9163 (MSJ- x 1.2-1.3)  Protein:  89-100 gr (0.8-0.9 gr/kg -CKD-3)  Fluid:  2.2-2.4 liters daily  EDUCATION NEEDS:   Education needs addressed (talked about quality sources of protein and emphasized the importance of meeting daily protien goals)  Colman Cater MS,RD,CSG,LDN Office:  [phone] Pager: [phone]

## 2016-12-13 NOTE — Progress Notes (Signed)
Inpatient Diabetes Program Recommendations  AACE/ADA: New Consensus Statement on Inpatient Glycemic Control (2015)  Target Ranges:  Prepandial:   less than 140 mg/dL      Peak postprandial:   less than 180 mg/dL (1-2 hours)      Critically ill patients:  140 - 180 mg/dL   Results for [name], [name] (MRN [id number]) as of 12/13/2016 12:49  Ref. Range 12/12/2016 15:49 12/12/2016 17:39 12/12/2016 20:21 12/13/2016 07:40 12/13/2016 11:22  Glucose-Capillary Latest Ref Range: 65 - 99 mg/dL 154 (H) 127 (H) 168 (H) 167 (H) 223 (H)   Review of Glycemic Control  Diabetes history: DM2 Outpatient Diabetes medications: Lantus 75 units QAM, Lantus 60 units QHS, Humalog 35 units with breakfast, Humalog 50 units with lunch and supper, Invokana 100 mg daily Current orders for Inpatient glycemic control:Lantus 30 units BID, Novolog 0-15 units TID with meals, Novolog 3 units TID with meals  Inpatient Diabetes Program Recommendations: HgbA1C: A1C 8.1% on 12/12/16 indicating an average glucose of 186 mg/dl.  NOTE: Spoke with patient about diabetes and home regimen for diabetes control. Patient reports that he is followed by PCP for diabetes management and currently he takes Lantus 75 units QAM, Lantus 60 units QHS, Humalog 35 units with breakfast, Humalog 50 units with lunch and supper, Invokana 100 mg daily as an outpatient for diabetes control. Patient reports that he is taking DM medications as prescribed.  Patient reports that he checks his glucose 2-3 times per day and that it is usually less than 200 mg/dl.  Patient reports that he would like to get a new glucometer and reports that his fingers are number from having to check his glucose so much. Talked with patient about alternative glucose monitoring devices (such as the FreeStyle [place]) that can read glucose using a sensor and reader device. Encouraged patient to ask PCP about options for glucose monitoring.  Inquired about prior A1C and patient reports that his  last A1C was 7.1% one week ago (done by [facility]). Discussed A1C results (8.1% on 12/12/16) and explained that his current A1C indicates an average glucose of 186 mg/dl over the past 2-3 months. Discussed glucose and A1C goals. Discussed importance of checking CBGs and maintaining good CBG control to prevent long-term and short-term complications. Explained how hyperglycemia leads to damage within blood vessels which lead to the common complications seen with uncontrolled diabetes. Stressed to the patient the importance of glycemic control to prevent further complications from uncontrolled diabetes especially to improve wound healing. Discussed impact of nutrition, exercise, stress, sickness, and medications on diabetes control. Patient states that he recently (3 months ago) made dietary changes and he has lost 10 pounds since making the change. Patient also reports that he is not able to exercise due to mobility limitations from a stroke in the past.   Explained to the patient that DM medications may need to be adjusted as he loses weight.  Encouraged patient to call his doctor if he experiences hypoglycemia. Patient verbalized understanding of information discussed and he states that he has no further questions at this time related to diabetes. Will continue to follow along and make recommendations if needed.  Thanks, Barnie Alderman, RN, MSN, CDE Diabetes Coordinator Inpatient Diabetes Program [phone] (Team Pager)

## 2016-12-13 NOTE — Care Management Note (Addendum)
Case Management Note  Patient Details  Name: SHLOIME KEILMAN MRN: [id number] Date of Birth: [birth year]  Subjective/Objective:                  Pt admitted with cellulitis. Pt is from home, lives alone. He has nephew that lives next door and another neighbor who helps if he needs it. He is ind with ADL's. He has PCP, insurance with drug coverage and he drives around [place], if he has appointments in [place] he gets someone else to drive but has transportation. He has been dressing his wound 'for some time' himself. He feels he can do it 'just fine'. He feels comfortable he will be able to dress his wound when he leaves. He uses cane with ambulation. He says he is supposed to wear a brace but doesn't because it rubs his leg and hurts. No needs communicated.   Action/Plan: Pt plans to return home with self care. No needs anticipated. CM will follow to DC. Pt on the [place] registry and will be automatically enrolled in [facility] Transition calls for general DC.   Expected Discharge Date:  12/13/16               Expected Discharge Plan:  Home/Self Care  In-House Referral:  NA  Discharge planning Services  CM Consult  Post Acute Care Choice:  NA Choice offered to:  NA  Status of Service:  In process, will continue to follow  Sherald Barge, RN 12/13/2016, 2:37 PM

## 2016-12-13 NOTE — Progress Notes (Signed)
Pts' HS CBG 250, MD made aware, will continue to monitor.

## 2016-12-13 NOTE — Consult Note (Signed)
   [facility] CM Inpatient Consult   12/13/2016  [name] 1946-01-01 [id number]   Chart review revealed patient eligible for [place] Management services and post hospital discharge follow up related to a diagnosis of diabetes. Patient was evaluated for community based chronic disease management services with [facility] care Management Program as a benefit of patient's [employer]. Met with the patient at the bedside to explain [place] Management services. Patient endorses his primary care provider to be Redmond School, MD. Patient states he had previously worked with [facility] and would like to have the services again. Patient specifically requested a nurse who could come to his home and help him obtain a new glucose meter.  Consent form signed by making his mark, stating he could not write. Patient gave [phone] as the best number to reach him. He also gave written permission to call his niece [name] at [phone] if he can not be reached. Patient will receive post hospital discharge calls and be evaluated for monthly home visits. [facility] Care Management services does not interfere with or replace any services arranged by the inpatient care management team. RNCM left contact information and [facility] literature at the bedside. Made inpatient RNCM aware that [facility] will be following for care management. For additional questions please contact:   Yoon Barca RN, [place] [facility] Liaison  [phone]) Business Mobile [phone]) Toll free office

## 2016-12-13 NOTE — Consult Note (Signed)
[facility] Nurse wound consult note Reason for Consult: Cellulitis to left plantar foot with nonintact lesion to left first metatarsal head, plantar aspect.  Wound type: Neuropathic, infectious Pressure Injury POA: Yes Measurement: 0.3 cm diameter opening with callous present circumferentially, extends 0.5 cm  Wound bed: Unable to visualize  Drainage (amount, consistency, odor) minimal serosanguinous  No odor Periwound:erythema and edema Dressing procedure/placement/frequency:Cleanse wound to left plantar foot with NS and pat gently dry.  Apply mupirocin ointment twice daily.  Cover with 4x4 gauze and kerlix/tape.  Will not follow at this time.  Please re-consult if needed.  Domenic Moras RN BSN [place] Pager [phone]

## 2016-12-13 NOTE — Progress Notes (Signed)
Changed pt's dressing per order, tolerated well.

## 2016-12-13 NOTE — Progress Notes (Signed)
PROGRESS NOTE    [name]  RCB:[id number] DOB: 02-01-1946 DOA: 12/12/2016 PCP: [name], MD    Brief Narrative: [name] is a 71 y.o. male with medical history significant of PVD, DM, stroke, Aortic valve replacement, PAF, on eliquis, CKD stage III last cr per records at 1.2. Who presents complaining of worsening redness left foot. He has chronic wound, small ulcer plantar aspect of firt metatarsal. He has been following  with PCP, doing local care, and was taking antibiotics. He presented to PCP office with worsening edema, redness and pain left foot. He denies nausea, vomiting or diarrhea. No fevers.   ED Course: Patient is afebrile, cr at 1.8, WBC at 8.1, CBG 252, ESR 60, foot x ray: no acute bony abnormalities.    Assessment & Plan:   Active Problems:   Stroke- Lt brain 2003   S/P AVR-tissue, 2010   DM2 (diabetes mellitus, type 2) ([place])   Acute renal failure: Cr up to 1.8   Cardiomyopathy, ischemic   A-fib (HCC)   CKD (chronic kidney disease), stage III   Chronic combined systolic (congestive) and diastolic (congestive) heart failure (HCC)   Cellulitis of left foot  1-Left foot cellulitis and chronic wound plantar aspect first metatarsal;  -MRI negative for osteomyelitis, positive for possible myositis and cellulitis.  Patient with significant redness left foot today. Renal function improved. Will change clindamycin to vancomycin. Continue with ceftriaxone and Add flagyl.  -No evidence of acute ischemia on physical exam.  -ABI with moderate disease on left. Discussed case with Dr Laurence Ferrari wit IR, they will help arranging outpatient follow up.  -Wound care consulted.  -Blood culture no growth to date.   2-AKI on CKD stage III Cr peak to 1.9 on admission. After IV fluids cr decreased to 1.5.  Will discontinue IV fluids to avoid pulmonary edema, patient with History of HF.  Continue to hold torsemide.   3-DM; continue with half dose home dose  lantus. SSI>   4-A fib; continue with eliquis and amiodarone.   5-CAD, cardiomyopathy;  Continue with lipitor, Metoprolol.  resume torsemide when renal function stabilized.   6-History of stroke; continue with eliquis.  7-PVD,  -On 04/23/15 he underwent right lower extremity arteriogram with recanalization of the right SFA occlusion and angioplasty/stenting of the right SFA and popliteal artery.  -On 10/07/15 he underwent atherectomy and drug-eluting balloon angioplasty of the distal SFA/popliteal stenosis as well as atherectomy and angioplasty of a peroneal artery stenosis. By Dr Laurence Ferrari  -Discussed ABI results with Dr Laurence Ferrari, patient will need outpatient follow up with arteriogram.   DVT prophylaxis: eliquis.  Code Status: Full code.  Family Communication: care discussed with patient  Disposition Plan: home when cellulitis improved.    Consultants:   IR, Dr Lyman Bishop, phone consultation    Procedures:   ABI;    Antimicrobials:   Vancomycin 5-01  Ceftriaxone 4-30  Flagyl 4-30   Subjective: He is feeling ok, report improvement of left foot pain. Redness worse today   Objective: Vitals:   12/12/16 1538 12/12/16 1724 12/12/16 2134 12/13/16 0650  BP: (!) 133/55 (!) 124/51 (!) 140/47 124/70  Pulse: 69 68 70 71  Resp: '18 18 20 18  ' Temp:  98.3 F (36.8 C) 98.5 F (36.9 C) 98.5 F (36.9 C)  TempSrc:   Oral Oral  SpO2: 97% 99% 100% 98%  Weight:  111.1 kg (245 lb)  110.8 kg (244 lb 4.3 oz)  Height:  '5\' 10"'  (1.778 m)  Intake/Output Summary (Last 24 hours) at 12/13/16 0917 Last data filed at 12/13/16 0835  Gross per 24 hour  Intake           1082.5 ml  Output             1050 ml  Net             32.5 ml   Filed Weights   12/12/16 1231 12/12/16 1724 12/13/16 0650  Weight: 111.1 kg (245 lb) 111.1 kg (245 lb) 110.8 kg (244 lb 4.3 oz)    Examination:  General exam: Appears calm and comfortable , obese Respiratory system: Clear to auscultation.  Respiratory effort normal. Cardiovascular system: S1 & S2 heard, RRR. No JVD, murmurs, rubs, gallops or clicks. No pedal edema. Gastrointestinal system: Abdomen is nondistended, soft and nontender. No organomegaly or masses felt. Normal bowel sounds heard. Central nervous system: Alert and oriented.  Extremities:  Left foot with edema and redness, worse, small open wound plantar aspect of first metatarsal.  Skin: see extremities.  Psychiatry: Judgement and insight appear normal. Mood & affect appropriate.     Data Reviewed: I have personally reviewed following labs and imaging studies  CBC:  Recent Labs Lab 12/12/16 1259 12/13/16 0534  WBC 8.1 7.7  NEUTROABS 5.8  --   HGB 13.7 12.9*  HCT 40.3 37.7*  MCV 87.8 88.1  PLT 242 403   Basic Metabolic Panel:  Recent Labs Lab 12/12/16 1259 12/13/16 0534  NA 136 134*  K 4.4 4.3  CL 102 101  CO2 23 24  GLUCOSE 252* 156*  BUN 26* 21*  CREATININE 1.88* 1.50*  CALCIUM 9.0 8.7*   GFR: Estimated Creatinine Clearance: 58.7 mL/min (A) (by C-G formula based on SCr of 1.5 mg/dL (H)). Liver Function Tests:  Recent Labs Lab 12/12/16 1259  AST 29  ALT 29  ALKPHOS 48  BILITOT 0.4  PROT 7.0  ALBUMIN 3.8   No results for input(s): LIPASE, AMYLASE in the last 168 hours. No results for input(s): AMMONIA in the last 168 hours. Coagulation Profile: No results for input(s): INR, PROTIME in the last 168 hours. Cardiac Enzymes: No results for input(s): CKTOTAL, CKMB, CKMBINDEX, TROPONINI in the last 168 hours. BNP (last 3 results) No results for input(s): PROBNP in the last 8760 hours. HbA1C:  Recent Labs  12/12/16 1529  HGBA1C 8.1*   CBG:  Recent Labs Lab 12/12/16 1549 12/12/16 1739 12/12/16 2021 12/13/16 0740  GLUCAP 154* 127* 168* 167*   Lipid Profile: No results for input(s): CHOL, HDL, LDLCALC, TRIG, CHOLHDL, LDLDIRECT in the last 72 hours. Thyroid Function Tests: No results for input(s): TSH, T4TOTAL, FREET4,  T3FREE, THYROIDAB in the last 72 hours. Anemia Panel: No results for input(s): VITAMINB12, FOLATE, FERRITIN, TIBC, IRON, RETICCTPCT in the last 72 hours. Sepsis Labs: No results for input(s): PROCALCITON, LATICACIDVEN in the last 168 hours.  Recent Results (from the past 240 hour(s))  Blood Cultures x 2 sites     Status: None (Preliminary result)   Collection Time: 12/12/16  3:38 PM  Result Value Ref Range Status   Specimen Description RIGHT ANTECUBITAL  Final   Special Requests   Final    BOTTLES DRAWN AEROBIC AND ANAEROBIC Blood Culture adequate volume   Culture NO GROWTH < 24 HOURS  Final   Report Status PENDING  Incomplete  Blood Cultures x 2 sites     Status: None (Preliminary result)   Collection Time: 12/12/16  3:39 PM  Result Value Ref Range  Status   Specimen Description BLOOD LEFT HAND  Final   Special Requests   Final    BOTTLES DRAWN AEROBIC AND ANAEROBIC Blood Culture adequate volume   Culture NO GROWTH < 24 HOURS  Final   Report Status PENDING  Incomplete         Radiology Studies: Mr Foot Left Wo Contrast  Result Date: 12/12/2016 CLINICAL DATA:  New ulcer along the left side of the foot. Erythema. EXAM: MRI OF THE LEFT FOOT WITHOUT CONTRAST TECHNIQUE: Multiplanar, multisequence MR imaging of the left forefoot was performed. No intravenous contrast was administered. COMPARISON:  12/12/2016 FINDINGS: Despite efforts by the technologist and patient, motion artifact is present on today's exam and could not be eliminated. This reduces exam sensitivity and specificity. Bones/Joint/Cartilage Mild degenerative findings at the first MTP joint. No significant abnormal osseous edema in the forefoot to suggest active osteomyelitis. First digit sesamoids appear unremarkable. Upper normal amount of fluid in the first MTP joint dorsally. Ligaments Lisfranc ligament intact. Muscles and Tendons Low-level edema tracks within along the plantar musculature of the foot and low-grade myositis  is a distinct possibility especially medially. Soft tissues Plantar ulceration just below the first MTP joint with abnormal edema infiltrating the subcutaneous tissues and a small amount of skin disruption shown on image 19/9. No underlying drainable abscess. Low-level subcutaneous edema along the dorsum of the foot tracks to the bases of the toes. IMPRESSION: 1. Ulceration plantar to the first MTP joint without drainable abscess or underlying osteomyelitis. No osteomyelitis in the forefoot. 2. There is localized subcutaneous edema in the region the ulceration which could reflect cellulitis, and also dorsal subcutaneous edema along the forefoot which is technically nonspecific but which could represent cellulitis as well. 3. There is edema in the plantar musculature of the foot, especially medially, and myositis is not excluded. Electronically Signed   By: Van Clines M.D.   On: 12/12/2016 16:35   US Arterial Seg Single  Result Date: 12/12/2016 CLINICAL DATA:  71 year old male with a nonhealing wound on the bottom of the foot. Prior placement of bilateral lower extremity stents. EXAM: NONINVASIVE PHYSIOLOGIC VASCULAR STUDY OF BILATERAL LOWER EXTREMITIES TECHNIQUE: Evaluation of both lower extremities was performed at rest, including calculation of ankle-brachial indices, multiple segmental pressure evaluation, segmental Doppler and segmental pulse volume recording. COMPARISON:  Prior duplex ultrasound evaluation 05/12/2016 FINDINGS: Right ABI:  0.41 (previously 0.66) Left ABI:  0.62 (previously 0.61) Right Lower Extremity: Monophasic and blunted arterial waveforms at the ankle. Left Lower Extremity:  Monophasic arterial waveforms at the ankle. IMPRESSION: 1. Slight interval degradation of the resting right ankle-brachial index to 0.41 from 0.66 in September of 2017. Findings are consistent with recurrent disease in the previously treated right lower extremity. Patient may benefit from repeat arteriogram  and intervention. 2. Stable resting left ankle brachial index of 0.6 consistent with moderate peripheral arterial disease. Signed, Criselda Peaches, MD [facility] Radiology Electronically Signed   By: Jacqulynn Cadet M.D.   On: 12/12/2016 15:40   Dg Foot Complete Left  Result Date: 12/12/2016 CLINICAL DATA:  Soft tissue ulcer EXAM: LEFT FOOT - COMPLETE 3+ VIEW COMPARISON:  None. FINDINGS: No acute fracture or dislocation is noted. Small calcaneal spurs are seen. Mild soft tissue swelling is noted. IMPRESSION: No acute bony abnormality noted. Electronically Signed   By: Inez Catalina M.D.   On: 12/12/2016 13:42        Scheduled Meds: . amiodarone  200 mg Oral  Daily  . apixaban  5 mg Oral BID  . atorvastatin  20 mg Oral q1800  . docusate sodium  100 mg Oral BID  . fenofibrate  160 mg Oral Daily  . ferrous sulfate  325 mg Oral Q breakfast  . gabapentin  300 mg Oral BID  . insulin aspart  0-15 Units Subcutaneous TID WC  . insulin aspart  3 Units Subcutaneous TID WC  . insulin glargine  30 Units Subcutaneous BID  . levothyroxine  25 mcg Oral QAC breakfast  . metoprolol succinate  25 mg Oral Daily  . pantoprazole  40 mg Oral BID   Continuous Infusions: . sodium chloride 50 mL/hr at 12/12/16 1723  . cefTRIAXone (ROCEPHIN)  IV Stopped (12/12/16 1601)  . clindamycin (CLEOCIN) IV Stopped (12/13/16 0614)     LOS: 0 days    Time spent: 35 minutes.     Elmarie Shiley, MD [facility] Pager [phone]  If 7PM-7AM, please contact night-coverage [email or web address] Password [email or web address] 12/13/2016, 9:17 AM

## 2016-12-14 ENCOUNTER — Telehealth: Payer: Self-pay | Admitting: *Deleted

## 2016-12-14 DIAGNOSIS — L03116 Cellulitis of left lower limb: Secondary | ICD-10-CM

## 2016-12-14 DIAGNOSIS — N179 Acute kidney failure, unspecified: Secondary | ICD-10-CM

## 2016-12-14 LAB — GLUCOSE, CAPILLARY
GLUCOSE-CAPILLARY: 260 mg/dL — AB (ref 65–99)
Glucose-Capillary: 220 mg/dL — ABNORMAL HIGH (ref 65–99)

## 2016-12-14 LAB — BASIC METABOLIC PANEL
ANION GAP: 7 (ref 5–15)
BUN: 20 mg/dL (ref 6–20)
CALCIUM: 8.8 mg/dL — AB (ref 8.9–10.3)
CO2: 23 mmol/L (ref 22–32)
Chloride: 103 mmol/L (ref 101–111)
Creatinine, Ser: 1.38 mg/dL — ABNORMAL HIGH (ref 0.61–1.24)
GFR calc non Af Amer: 51 mL/min — ABNORMAL LOW (ref >60)
GFR, EST AFRICAN AMERICAN: 59 mL/min — AB (ref >60)
GLUCOSE: 206 mg/dL — AB (ref 65–99)
POTASSIUM: 4.4 mmol/L (ref 3.5–5.1)
Sodium: 133 mmol/L — ABNORMAL LOW (ref 135–145)

## 2016-12-14 MED ORDER — DOXYCYCLINE HYCLATE 100 MG PO CAPS: 100.0000 mg | ORAL_CAPSULE | Freq: Two times a day (BID) | ORAL | 0 refills | Status: DC

## 2016-12-14 NOTE — Care Management Note (Signed)
Case Management Note  Patient Details  Name: [name] MRN: [id number] Date of Birth: [birth year]  Expected Discharge Date:  12/14/16               Expected Discharge Plan:  Home/Self Care  In-House Referral:  NA  Discharge planning Services  CM Consult  Post Acute Care Choice:  NA Choice offered to:  NA  Status of Service:  Completed, signed off    Additional Comments: Pt discharged home today with self care. No CM needs.   Sherald Barge, RN 12/14/2016, 2:03 PM

## 2016-12-14 NOTE — Patient Outreach (Addendum)
[facility]) Care Management  12/14/2016  [name] January 19, 1946 [id number]   Care coordination   [facility] CM received a referral to engage for transition of care calls and evaluate for monthly home visits. Per [name] [facility] [name], the patient stated he was in need of a new glucose monitor and would like the strips to be delivered to his home through his pharmacy ([name]). [name] was discharged today, 12/14/16, in stable condition on a 10 day course of doxycycline, order to increase activity slowly and a low sodium heart healthy diet.  He was admitted on 12/12/16 for left foot cellulitis, chronic wound, small ulcer plantar aspect of first metatarsal..  He was placed on IV antibiotics, ceftriaxone and clindamycin. IV vancomycin was avoided due to AKI  [name] is a 71 year old male with PMH of PVD, DM2, stroke, stroke, Afib, CAD,  Aortic valve replacement s/p 2010, PAF, on eliquis, AKI on CKD stage III and chronic combined systolic (congestive) and diastolic (congestive) heart failure who presented to the ED from his pcp [name]) office complaining of worsening redness left foot pain, worsening edema and redness. [name] has previously worked with [facility]. [name] assisted with getting [name] Consent form signed by making his mark, stating he could not write. Patient gave [phone] as the best number to reach him. He also gave written permission to call his niece [name] at [phone] if he can not be reached.    [facility] CM attempt to call patient but no voice mail services set up per the automated voice message on his phone number that is listed as both his home and mobile number  Plan [facility] CM will return a call to [name] to attempt to reach him to schedule a home visit or begin Transition of care assessment  Rickey L. Lavina Hamman, RN, BSN, [place] Care Management [phone]

## 2016-12-14 NOTE — Progress Notes (Signed)
Inpatient Diabetes Program Recommendations  AACE/ADA: New Consensus Statement on Inpatient Glycemic Control (2015)  Target Ranges:  Prepandial:   less than 140 mg/dL      Peak postprandial:   less than 180 mg/dL (1-2 hours)      Critically ill patients:  140 - 180 mg/dL   Results for [name], [name] (MRN [id number]) as of 12/14/2016 08:17  Ref. Range 12/13/2016 07:40 12/13/2016 11:22 12/13/2016 17:03 12/13/2016 21:11 12/13/2016 23:59 12/14/2016 07:22  Glucose-Capillary Latest Ref Range: 65 - 99 mg/dL 167 (H) 223 (H) 212 (H) 250 (H) 212 (H) 220 (H)   Review of Glycemic Control  Diabetes history: DM2 Outpatient Diabetes medications: Lantus 75 units QAM, Lantus 60 units QHS, Humalog 35 units with breakfast, Humalog 50 units with lunch and supper, Invokana 100 mg daily Current orders for Inpatient glycemic control: Lantus 30 units BID, Novolog 0-15 units TID with meals, Novolog 3 units TID with meals  Inpatient Diabetes Program Recommendations: Insulin - Basal: Please consider increasing Lantus to 35 units BID. Correction (SSI): Please consider ordering Novolog 0-5 units QHS for bedtime correction. Insulin - Meal Coverage: Please consider increasing meal coverage to Novolog 6 units TID with meals. HgbA1C: A1C 8.1% on 12/12/16 indicating an average glucose of 186 mg/dl.  Thanks, Barnie Alderman, RN, MSN, CDE Diabetes Coordinator Inpatient Diabetes Program [phone] (Team Pager from 8am to 5pm)

## 2016-12-14 NOTE — Progress Notes (Signed)
Pt discharged in stable condition into the care of his family via wheelchair via private vehicle.  PIV removed intact and w/o S&S of complications.  Discharge instructions reviewed with pt.  Pt verbalized understanding.

## 2016-12-14 NOTE — Discharge Summary (Signed)
Physician Discharge Summary  [name]:[id number] DOB: 29-Mar-1946 DOA: 12/12/2016  PCP: Rickey Herring, MD  Admit date: 12/12/2016 Discharge date: 12/14/2016  Time spent: 45 minutes  Recommendations for Outpatient Follow-up:  -Will be discharged home today. -Advised to complete 10 day course of doxycycline and to follow up with PCP in 2 weeks.   Discharge Diagnoses:  Active Problems:   Stroke- Lt brain 2003   S/P AVR-tissue, 2010   DM2 (diabetes mellitus, type 2) ([place])   Acute renal failure: Cr up to 1.8   Cardiomyopathy, ischemic   A-fib (HCC)   CKD (chronic kidney disease), stage III   Chronic combined systolic (congestive) and diastolic (congestive) heart failure (HCC)   Cellulitis of left foot   Discharge Condition: Stable and improved  Filed Weights   12/12/16 1724 12/13/16 0650 12/14/16 0502  Weight: 111.1 kg (245 lb) 110.8 kg (244 lb 4.3 oz) 109.7 kg (241 lb 14.4 oz)    History of present illness:  As per Dr. Tyrell [name] on 4/30Lynnell Jude I [name] is a 71 y.o. male with medical history significant of PVD, DM, stroke, Aortic valve replacement, PAF, on eliquis, CKD stage III last cr per records at 1.2. Who presents complaining of worsening redness left foot. He has chronic wound, small ulcer plantar aspect of firt metatarsal. He has been following  with PCP, doing local care, and was taking antibiotics. He presented to PCP office with worsening edema, redness and pain left foot. He denies nausea, vomiting or diarrhea. No fevers.   ED Course: Patient is afebrile, cr at 1.8, WBC at 8.1, CBG 252, ESR 60, foot x ray: no acute bony abnormalities.   Hospital Course:   1-Left foot cellulitis and chronic wound plantar aspect first metatarsal;  -MRI negative for osteomyelitis, positive for possible myositis and cellulitis.  -Cellulitis appears improved. Will DC on 10 days of doxycycline. -No evidence of acute ischemia on physical exam.  -ABI with moderate disease  on left. My colleague discussed case with Dr [name] wit IR, they will arrange outpatient follow up.  -Wound care consulted.  -Blood culture no growth to date.   2-AKI on CKD stage III Cr back to baseline of around 1.3 at time of DC.  3-DM;  -fair control, continue home regimen  4-A fib;continue with eliquis and amiodarone.  Has been rate controlled  5-CAD, cardiomyopathy;  Continue with lipitor, Metoprolol.  resume torsemide when renal function stabilized.   6-History of stroke; continue with eliquis.   7-PVD -On 04/23/15 he underwent right lower extremity arteriogram with recanalization of the right SFA occlusion and angioplasty/stenting of the right SFA and popliteal artery.  -On 10/07/15 he underwent atherectomy and drug-eluting balloon angioplasty of the distal SFA/popliteal stenosis as well as atherectomy and angioplasty of a peroneal artery stenosis. By Dr [name]  -Discussed ABI results with Dr [name], patient will need outpatient follow up with arteriogram.    Procedures:  None   Consultations:  None  Discharge Instructions  Discharge Instructions    AMB Referral to [facility] [facility]    Complete by:  As directed    Reason for consult:  Post hospital discharge follow by a RN community case manager   Diagnoses of:  Diabetes   Expected date of contact:  1-3 days (reserved for hospital discharges)   Please assign patient for community nurse to engage for transition of care calls and evaluate for monthly home visits. Patient stated he was in need of a new  glucose monitor and would like the strips to be delivered to his home through his pharmacy. For questions please contact:   Janci Minor RN, [facility] Liaison [phone])   Diet - low sodium heart healthy    Complete by:  As directed    Increase activity slowly    Complete by:  As directed      Allergies as of 12/14/2016   No Known Allergies     Medication List    TAKE these  medications   acetaminophen 500 MG tablet Commonly known as:  TYLENOL Take 1,000 mg by mouth 2 (two) times daily as needed for headache. For pain/headaches   alprazolam 2 MG tablet Commonly known as:  XANAX Take 1 mg by mouth 3 (three) times daily.   amiodarone 200 MG tablet Commonly known as:  PACERONE Take 1 tablet (200 mg total) by mouth daily.   apixaban 5 MG Tabs tablet Commonly known as:  ELIQUIS Take 1 tablet (5 mg total) by mouth 2 (two) times daily.   atorvastatin 20 MG tablet Commonly known as:  LIPITOR Take 1 tablet (20 mg total) by mouth daily at 6 PM.   docusate sodium 100 MG capsule Commonly known as:  COLACE Take 100 mg by mouth 2 (two) times daily.   doxycycline 100 MG capsule Commonly known as:  VIBRAMYCIN Take 1 capsule (100 mg total) by mouth 2 (two) times daily.   fenofibrate 145 MG tablet Commonly known as:  TRICOR TAKE ONE TABLET EACH DAY.   ferrous sulfate 325 (65 FE) MG EC tablet Take 325 mg by mouth daily with breakfast.   Fish Oil 1200 MG Caps Take by mouth.   gabapentin 300 MG capsule Commonly known as:  NEURONTIN Take 300 mg by mouth 2 (two) times daily.   insulin glargine 100 UNIT/ML injection Commonly known as:  LANTUS Inject 0.6-0.75 mLs (60-75 Units total) into the skin 2 (two) times daily. 75 units in the morning then 60 units at bedtime   insulin lispro 100 UNIT/ML injection Commonly known as:  HUMALOG Inject 0.35-0.5 mLs (35-50 Units total) into the skin 3 (three) times daily before meals. Patient states that he takes 35 units at breakfast, 50 units at dinner and 50 units at supper.   INVOKANA 100 MG Tabs tablet Generic drug:  canagliflozin Take 1 tablet by mouth daily.   levothyroxine 25 MCG tablet Commonly known as:  SYNTHROID, LEVOTHROID Take 25 mcg by mouth daily before breakfast.   meclizine 25 MG tablet Commonly known as:  ANTIVERT Take 25 mg by mouth 3 (three) times daily as needed for dizziness.   metoprolol  succinate 25 MG 24 hr tablet Commonly known as:  TOPROL-XL Take 1 tablet (25 mg total) by mouth daily.   pantoprazole 40 MG tablet Commonly known as:  PROTONIX Take 1 tablet (40 mg total) by mouth 2 (two) times daily.   torsemide 10 MG tablet Commonly known as:  DEMADEX Take 1 tablet (10 mg total) by mouth daily.      No Known Allergies Follow-up Information    MCCULLOUGH, HEATH, MD Follow up in 1 week(s).   Specialties:  Interventional Radiology, Radiology Why:  office will call you with appointment.  Contact information: [address] [place] [address] [phone]        Rickey Herring, MD. Schedule an appointment as soon as possible for a visit in 2 week(s).   Specialty:  Internal Medicine Contact information: [place]  [place] [address] [phone]            The results of significant diagnostics from this hospitalization (including imaging, microbiology, ancillary and laboratory) are listed below for reference.    Significant Diagnostic Studies: Mr Foot Left Wo Contrast  Result Date: 12/12/2016 CLINICAL DATA:  New ulcer along the left side of the foot. Erythema. EXAM: MRI OF THE LEFT FOOT WITHOUT CONTRAST TECHNIQUE: Multiplanar, multisequence MR imaging of the left forefoot was performed. No intravenous contrast was administered. COMPARISON:  12/12/2016 FINDINGS: Despite efforts by the technologist and patient, motion artifact is present on today's exam and could not be eliminated. This reduces exam sensitivity and specificity. Bones/Joint/Cartilage Mild degenerative findings at the first MTP joint. No significant abnormal osseous edema in the forefoot to suggest active osteomyelitis. First digit sesamoids appear unremarkable. Upper normal amount of fluid in the first MTP joint dorsally. Ligaments Lisfranc ligament intact. Muscles and Tendons Low-level edema tracks within along the plantar musculature of the foot and low-grade  myositis is a distinct possibility especially medially. Soft tissues Plantar ulceration just below the first MTP joint with abnormal edema infiltrating the subcutaneous tissues and a small amount of skin disruption shown on image 19/9. No underlying drainable abscess. Low-level subcutaneous edema along the dorsum of the foot tracks to the bases of the toes. IMPRESSION: 1. Ulceration plantar to the first MTP joint without drainable abscess or underlying osteomyelitis. No osteomyelitis in the forefoot. 2. There is localized subcutaneous edema in the region the ulceration which could reflect cellulitis, and also dorsal subcutaneous edema along the forefoot which is technically nonspecific but which could represent cellulitis as well. 3. There is edema in the plantar musculature of the foot, especially medially, and myositis is not excluded. Electronically Signed   By: Van Clines M.D.   On: 12/12/2016 16:35   US Arterial Seg Single  Result Date: 12/12/2016 CLINICAL DATA:  71 year old male with a nonhealing wound on the bottom of the foot. Prior placement of bilateral lower extremity stents. EXAM: NONINVASIVE PHYSIOLOGIC VASCULAR STUDY OF BILATERAL LOWER EXTREMITIES TECHNIQUE: Evaluation of both lower extremities was performed at rest, including calculation of ankle-brachial indices, multiple segmental pressure evaluation, segmental Doppler and segmental pulse volume recording. COMPARISON:  Prior duplex ultrasound evaluation 05/12/2016 FINDINGS: Right ABI:  0.41 (previously 0.66) Left ABI:  0.62 (previously 0.61) Right Lower Extremity: Monophasic and blunted arterial waveforms at the ankle. Left Lower Extremity:  Monophasic arterial waveforms at the ankle. IMPRESSION: 1. Slight interval degradation of the resting right ankle-brachial index to 0.41 from 0.66 in September of 2017. Findings are consistent with recurrent disease in the previously treated right lower extremity. Patient may benefit from repeat  arteriogram and intervention. 2. Stable resting left ankle brachial index of 0.6 consistent with moderate peripheral arterial disease. Signed, Criselda Peaches, MD [facility] Radiology Electronically Signed   By: Jacqulynn Cadet M.D.   On: 12/12/2016 15:40   Dg Foot Complete Left  Result Date: 12/12/2016 CLINICAL DATA:  Soft tissue ulcer EXAM: LEFT FOOT - COMPLETE 3+ VIEW COMPARISON:  None. FINDINGS: No acute fracture or dislocation is noted. Small calcaneal spurs are seen. Mild soft tissue swelling is noted. IMPRESSION: No acute bony abnormality noted. Electronically Signed   By: Inez Catalina M.D.   On: 12/12/2016 13:42    Microbiology: Recent Results (from the past 240 hour(s))  Blood Cultures x 2 sites     Status: None (Preliminary result)   Collection Time: 12/12/16  3:38 PM  Result Value Ref Range Status   Specimen Description RIGHT ANTECUBITAL  Final   Special Requests   Final    BOTTLES DRAWN AEROBIC AND ANAEROBIC Blood Culture adequate volume   Culture NO GROWTH 2 DAYS  Final   Report Status PENDING  Incomplete  Blood Cultures x 2 sites     Status: None (Preliminary result)   Collection Time: 12/12/16  3:39 PM  Result Value Ref Range Status   Specimen Description BLOOD LEFT HAND  Final   Special Requests   Final    BOTTLES DRAWN AEROBIC AND ANAEROBIC Blood Culture adequate volume   Culture NO GROWTH 2 DAYS  Final   Report Status PENDING  Incomplete     Labs: Basic Metabolic Panel:  Recent Labs Lab 12/12/16 1259 12/13/16 0534 12/14/16 0531  NA 136 134* 133*  K 4.4 4.3 4.4  CL 102 101 103  CO2 '23 24 23  ' GLUCOSE 252* 156* 206*  BUN 26* 21* 20  CREATININE 1.88* 1.50* 1.38*  CALCIUM 9.0 8.7* 8.8*   Liver Function Tests:  Recent Labs Lab 12/12/16 1259  AST 29  ALT 29  ALKPHOS 48  BILITOT 0.4  PROT 7.0  ALBUMIN 3.8   No results for input(s): LIPASE, AMYLASE in the last 168 hours. No results for input(s):  AMMONIA in the last 168 hours. CBC:  Recent Labs Lab 12/12/16 1259 12/13/16 0534  WBC 8.1 7.7  NEUTROABS 5.8  --   HGB 13.7 12.9*  HCT 40.3 37.7*  MCV 87.8 88.1  PLT 242 244   Cardiac Enzymes: No results for input(s): CKTOTAL, CKMB, CKMBINDEX, TROPONINI in the last 168 hours. BNP: BNP (last 3 results)  Recent Labs  02/23/16 1956  BNP 128.6*    ProBNP (last 3 results) No results for input(s): PROBNP in the last 8760 hours.  CBG:  Recent Labs Lab 12/13/16 1703 12/13/16 2111 12/13/16 2359 12/14/16 0722 12/14/16 1117  GLUCAP 212* 250* 212* 220* 260*       Signed:  [facility] Pager: [phone] 12/14/2016, 1:09 PM

## 2016-12-14 NOTE — Care Management Important Message (Signed)
Important Message  Patient Details  Name: [name] MRN: [id number] Date of Birth: June 01, 1946   Medicare Important Message Given:  Yes    Sherald Barge, RN 12/14/2016, 2:03 PM

## 2016-12-15 ENCOUNTER — Other Ambulatory Visit (HOSPITAL_COMMUNITY): Payer: Self-pay | Admitting: Interventional Radiology

## 2016-12-15 DIAGNOSIS — I739 Peripheral vascular disease, unspecified: Secondary | ICD-10-CM

## 2016-12-16 ENCOUNTER — Other Ambulatory Visit: Payer: Self-pay | Admitting: *Deleted

## 2016-12-16 NOTE — Patient Outreach (Signed)
[facility]) Care Management  [place]  12/16/2016   [name] 1945-12-17 [id number]  [name] is a 29 year oldmalewith PMH of PVD, DM2, stroke, stroke, Afib, CAD,  Aortic valve replacement s/p 2010, PAF, on eliquis, AKI on CKD stage III and chronic combined systolic (congestive) and diastolic (congestive) heart failure who presented to the ED from his pcp [name] office complaining of worsening redness left foot pain, worsening edema and redness. [name] has previously worked with [facility]. [facility] hospital liaison assisted with getting THNConsent form signed by making his mark, stating he could not write.Patient gave [phone] the best number to reach him. He also gave written permission to call his niece [name] at [phone] he can not be reached.    [facility] CM received a referral on 12/14/16 to engage for transition of care calls and evaluate for monthly home visits. Per [facility] hospital liaison, the patient stated he was in need of a new glucose monitor and would like the strips to be delivered to his home through his pharmacy ([facility]). [name] was discharged today, 12/14/16, in stable condition on a 10 day course of doxycycline, order to increase activity slowly and a low sodium heart healthy diet.  He was admitted on 12/12/16 for left foot cellulitis, chronic wound, small ulcer plantar aspect of first metatarsal..  He was placed on IV antibiotics, ceftriaxone and clindamycin. IV vancomycin was avoided due to AKI  [facility] CM made attempt to reach [name] via telephone on 12/14/16 evening and on 12/15/16 evening without success in getting him on his mobile telephone.    [facility] CM reached [name] today who states he is an earlier morning person and agreed to met with [facility] CM on Thursday October 22, 2016 at his home for our first home visit.  TOC assessment started to confirm he has his antibiotic and other medications and  is taking them. He reports he has an Accu Chek glucometer in which he checked his cbg with this am and it was 161 He confirmed his insurance coverage a [place] health care medicare and reports a "yearly visit from their nurse" who informed him his last A1C was "seven point seven"  CM discussed goal HgA1c value as 7 and he stated he wanted to get to that [name] reports losing wt from "two fifty eight to two 66 one"  He reports being from [place] and moved to [place].  He confirms a follow up appointment with Dr Gerarda Fraction , pcp on Monday 12/19/16 and an appointment on 12/20/16 to go to radiology to "check how my blood is flowing" He reports no swelling of his feet.  He reports driving him self today to get breakfast (a chicken biscuit and hash browns from "[employer] right near me". [facility] CM encouraged low sodium, diabetic diet and agree to review the diet with him further during a home visit.    He reports he will be driving himself to his Monday appointment but his Nephew will be driving him to his Tuesday appointment in [place] because he does not like to drive in [place].   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 acetaminophen (TYLENOL) 500 MG tablet Take 1,000 mg by mouth 2 (two) times daily as needed for headache. For pain/headaches  . alprazolam (XANAX) 2 MG tablet Take 1 mg by mouth 3 (three) times daily.   Marland Kitchen amiodarone (PACERONE) 200 MG tablet Take 1 tablet (200 mg total) by mouth daily.  Marland Kitchen apixaban (ELIQUIS) 5 MG TABS tablet Take 1 tablet (5 mg total) by mouth 2 (two) times daily.  Marland Kitchen atorvastatin (LIPITOR) 20 MG tablet Take 1 tablet (20 mg  total) by mouth daily at 6 PM.  . docusate sodium (COLACE) 100 MG capsule Take 100 mg by mouth 2 (two) times daily.  Marland Kitchen doxycycline (VIBRAMYCIN) 100 MG capsule Take 1 capsule (100 mg total) by mouth 2 (two) times daily.  . fenofibrate (TRICOR) 145 MG tablet TAKE ONE TABLET EACH DAY.  . ferrous sulfate 325 (65 FE) MG EC tablet Take 325 mg by mouth daily with breakfast.  . gabapentin (NEURONTIN) 300 MG capsule Take 300 mg by mouth 2 (two) times daily.   . insulin glargine (LANTUS) 100 UNIT/ML injection Inject 0.6-0.75 mLs (60-75 Units total) into the skin 2 (two) times daily. 75 units in the morning then 60 units at bedtime  . insulin lispro (HUMALOG) 100 UNIT/ML injection Inject 0.35-0.5 mLs (35-50 Units total) into the skin 3 (three) times daily before meals. Patient states that he takes 35 units at breakfast, 50 units at dinner and 50 units at supper.  . INVOKANA 100 MG TABS tablet Take 1 tablet by mouth daily.  Marland Kitchen levothyroxine (SYNTHROID, LEVOTHROID) 25 MCG tablet Take 25 mcg by mouth daily before breakfast.  . meclizine (ANTIVERT) 25 MG tablet Take 25 mg by mouth 3 (three) times daily as needed for dizziness.  . metoprolol succinate (TOPROL-XL) 25 MG 24 hr tablet Take 1 tablet (25 mg total) by mouth daily.  . Omega-3 Fatty Acids (FISH OIL) 1200 MG CAPS Take by mouth.  . pantoprazole (PROTONIX) 40 MG tablet Take 1 tablet (40 mg total) by mouth 2 (two) times daily.  Marland Kitchen torsemide (DEMADEX) 10 MG tablet Take 1 tablet (10 mg total) by mouth daily.   No facility-administered encounter medications on file as of 12/16/2016.      Assessment: see above notes    Plan:  [facility] CM will see [name] on 12/21/16 at his home for our first [facility] CM home visit, complete Transition of care assessment, provided education, and assess other home needs related to DM, afib,  Routed note to [employer]. Lavina Hamman, RN, BSN, [place] Care Management [phone]

## 2016-12-17 LAB — CULTURE, BLOOD (ROUTINE X 2)
CULTURE: NO GROWTH
CULTURE: NO GROWTH
Special Requests: ADEQUATE
Special Requests: ADEQUATE

## 2016-12-19 DIAGNOSIS — L03116 Cellulitis of left lower limb: Secondary | ICD-10-CM | POA: Diagnosis not present

## 2016-12-19 DIAGNOSIS — L97521 Non-pressure chronic ulcer of other part of left foot limited to breakdown of skin: Secondary | ICD-10-CM | POA: Diagnosis not present

## 2016-12-19 DIAGNOSIS — E1165 Type 2 diabetes mellitus with hyperglycemia: Secondary | ICD-10-CM | POA: Diagnosis not present

## 2016-12-20 ENCOUNTER — Other Ambulatory Visit: Payer: Medicare Other

## 2016-12-21 ENCOUNTER — Ambulatory Visit (HOSPITAL_COMMUNITY): Payer: Medicare Other | Attending: Internal Medicine | Admitting: Physical Therapy

## 2016-12-21 DIAGNOSIS — L97425 Non-pressure chronic ulcer of left heel and midfoot with muscle involvement without evidence of necrosis: Secondary | ICD-10-CM | POA: Insufficient documentation

## 2016-12-21 DIAGNOSIS — R600 Localized edema: Secondary | ICD-10-CM | POA: Diagnosis not present

## 2016-12-21 DIAGNOSIS — R262 Difficulty in walking, not elsewhere classified: Secondary | ICD-10-CM | POA: Diagnosis not present

## 2016-12-21 DIAGNOSIS — M79672 Pain in left foot: Secondary | ICD-10-CM | POA: Diagnosis not present

## 2016-12-21 DIAGNOSIS — E08621 Diabetes mellitus due to underlying condition with foot ulcer: Secondary | ICD-10-CM

## 2016-12-21 NOTE — Therapy (Signed)
[place], [name], [address] Phone: [phone]   Fax:  [phone]  Wound Care Evaluation  Patient Details  Name: [name] MRN: [id number] Date of Birth: 04/14/1946 Referring Provider: Nicholes Mango  Encounter Date: 12/21/2016      PT End of Session - 12/21/16 1604    Visit Number 1   Number of Visits 12   Date for PT Re-Evaluation 01/20/17   Authorization Type [employer] medicare   Authorization - Visit Number 1   Authorization - Number of Visits 12   PT Start Time 9381   PT Stop Time 0175   PT Time Calculation (min) 38 min   Activity Tolerance Patient tolerated treatment well   Behavior During Therapy [facility] for tasks assessed/performed      Past Medical History:  Diagnosis Date  . At risk for sudden cardiac death 2013/06/08  . Cataract   . Chronic anticoagulation 05/2013   started  . Chronic kidney disease    Patient reports he is seeing Kidney doctor in September  . Coronary artery disease   . Diabetes mellitus    type 2   . Dyslipidemia   . GERD (gastroesophageal reflux disease)   . History of valve replacement 2010   [facility] Ease bioprosthetic 22mm  . Hypertension   . PAF (paroxysmal atrial fibrillation) ([place]) 05/2013   converted with amiodarone to SR  . S/P CABG x 3 2010  . S/P cardiac catheterization, Rt & Lt heart cath 06/05/2013 June 08, 2013  . Stroke [facility]) 2003   R-sided weakness & upper extremity swelling   . Thyroid disease     Past Surgical History:  Procedure Laterality Date  . AORTIC VALVE REPLACEMENT  01/26/2009   [facility] Ease bioprosthetic 65mm valve  . CARDIAC CATHETERIZATION  06/05/2013   native 3 vessel disease; patent LIMA to LAD, SVG to OM and SVG to PDA; Elevated right and left heart filling pressures, with predominantly pulmonary venous hypertension, normal PVR  . COLONOSCOPY  2007   Dr. Aviva Signs: internal hemorrhoids  . COLONOSCOPY N/A 06/16/2016    Procedure: COLONOSCOPY;  Surgeon: Rogene [place], MD;  Location: [facility] END[facility] SUITE;  Service: Endoscopy;  Laterality: N/A;  1:45  . CORONARY ARTERY BYPASS GRAFT  01/26/2009   LIMA to LAD, SVG to circumflex, SVG to PDA  . GRAFT(S) ANGIOGRAM  06/05/2013   Procedure: GRAFT(S) Cyril Loosen;  Surgeon: Leonie Man, MD;  Location: [facility] CATH LAB;  Service: Cardiovascular;;  . IR GENERIC HISTORICAL  05/12/2016   IR RADIOLOGIST EVAL & MGMT 05/12/2016 Jacqulynn Cadet, MD [facility] INTERV RAD  . LEFT AND RIGHT HEART CATHETERIZATION WITH CORONARY ANGIOGRAM N/A 06/05/2013   Procedure: LEFT AND RIGHT HEART CATHETERIZATION WITH CORONARY ANGIOGRAM;  Surgeon: Leonie Man, MD;  Location: [facility] CATH LAB;  Service: Cardiovascular;  Laterality: N/A;  . TRANSTHORACIC ECHOCARDIOGRAM  09/2011   grade 1 diastolic dysfunction; increasing valve gradient; calcified MV annulus   . TRANSTHORACIC ECHOCARDIOGRAM  06/03/2013   EF 25-30%, mod conc. hypertrophy, grade 3 diastolic dysfunction; LA mod dilated; calcified MV annulus; transaortic gradients are normal for the bioprosthetic valve; inf vena cava dilated (elevated CVP) - LifeVest    There were no vitals filed for this visit.        [facility] PT Assessment - 12/21/16 0001      Assessment   Medical Diagnosis diabetic ulcer   Referring Provider Lawerence Fusco   Onset Date/Surgical Date 09/15/16  approximate  Next MD Visit unknown   Prior Therapy none for this diagnosis     Precautions   Precautions None     Restrictions   Weight Bearing Restrictions No     Balance Screen   Has the patient fallen in the past 6 months No   Has the patient had a decrease in activity level because of a fear of falling?  Yes  MD states to try and keep leg elevated   Is the patient reluctant to leave their home because of a fear of falling?  No     Prior Function   Level of Independence Independent with community mobility with device   Vocation Retired     Cognition   Overall  Cognitive Status Within Functional Limits for tasks assessed         Wound Therapy - 12/21/16 1537    Subjective [name] states that he noted a hole in his left foot several months ago.  He went to the MD and had been taking antibiotics.  His foot continued to have increased pain and swelling so he went back to the MD who sent him to the ER.  He was hopitalized on 4/30 for IV antibiotics.  He was discharged on 12/14/2016 and is now being referred to skilled physical therapy for wound care.    Patient and Family Stated Goals wound to heal    Date of Onset 09/15/16  approximate   Prior Treatments self care, antibiotics, IP hospitalization    Pain Assessment 0-10   Pain Score 4    Pain Type Chronic pain   Pain Location Foot   Pain Orientation Left;Medial   Pain Descriptors / Indicators Sore   Pain Onset With Activity   Patients Stated Pain Goal 0   Pain Intervention(s) Distraction   Multiple Pain Sites No   Evaluation and Treatment Procedures Explained to Patient/Family Yes   Evaluation and Treatment Procedures agreed to   Wound Properties Date First Assessed: 12/12/16 Time First Assessed: 1658 Wound Type: Diabetic ulcer Location: Foot Location Orientation: Left;Upper Present on Admission: Yes   Dressing Type Gauze (Comment)   Dressing Changed Changed   Dressing Status Clean;Dry;Intact   Dressing Change Frequency PRN   Site / Wound Assessment Dry   % Wound base Red or Granulating 60%   % Wound base Yellow/Fibrinous Exudate 40%   Peri-wound Assessment Edema;Erythema (blanchable)   Wound Length (cm) 0.7 cm   Wound Width (cm) 0.5 cm   Wound Depth (cm) 0.9 cm   Undermining (cm) entire wound    Margins Unattached edges (unapproximated)  callous surrounding wound    Drainage Amount None   Treatment Cleansed;Debridement (Selective);Other (Comment)   Selective Debridement - Location callous area as well as wound bed   Selective Debridement - Tools Used Forceps;Scalpel   Selective  Debridement - Tissue Removed callous and slough   Wound Therapy - Clinical Statement [name] is a known pt to this clinic.  He has been treated before on his right foot for diabetic ulcers.  He was hospitalized for cellulitis and diabetic ulcer on the plantar aspect of his left foot at the first MTP.   His past medical history includes PVD, DM, CVA and CKD.  He is now being referred for skilled physical theapy for wound care to ensure that the wound environment stays conducive to healing.  Examination demonstrates a nonhealing wound with significant depth that is surrounded by a callous.  [name] will benefit from skilled physical  therapy for debridement and dressing changes unti the wound is healed.       Wound Therapy - Functional Problem List Pt needs to stay off his foot as much as possible.  The therapis tried a [employer] shoe but pt was unable to balance and was a risk fall when using.    Factors Delaying/Impairing Wound Healing Altered sensation;Diabetes Mellitus;Infection - systemic/local;Immobility;Multiple medical problems;Polypharmacy;Vascular compromise;Other (comment)  altered gait due to stroke.    Hydrotherapy Plan Debridement;Dressing change;Patient/family education;Pulsatile lavage with suction   Wound Therapy - Frequency --  2x a week for 6 weeks    Wound Therapy - Current Recommendations PT   Wound Plan cleansing, debridement and dressing change.  May use pulse lavage if indicated.    Dressing  medihoney 2x2, kling and netting.                          PT Education - 12/21/16 1603    Education provided Yes   Education Details Keep off LE as much as possible, keep dressing dry and change if they get wet.    Person(s) Educated Patient   Methods Explanation   Comprehension Verbalized understanding          PT Short Term Goals - 12/21/16 1606      PT SHORT TERM GOAL #1   Title Pt wound depth to be no greater than .6   Time 3   Period Weeks   Status  New     PT SHORT TERM GOAL #2   Title Callous around Lt plantar wound to be gone to decrease pressure placed on wound when walking    Time 3   Period Weeks   Status New     PT SHORT TERM GOAL #3   Title Redness and edema along LT 1st MTP to be gone to demonstrate the absence of infection.    Time 3   Period Weeks   Status New     PT SHORT TERM GOAL #4   Title Pt to verbalize the importance of keeping his sugar levels around 100 to allow healing of wound.   Time 3   Period Weeks   Status New           PT Long Term Goals - 12/21/16 1608      PT LONG TERM GOAL #1   Title Wound depth to be no greater than .2 to allow pt to feel comfortable with self treatment.    Time 6   Period Weeks   Status New     PT LONG TERM GOAL #2   Title Wound on Lt plantar aspect to have no undermining to allow pt to feel cmforable with self treatment.    Time 6   Period Weeks   Status New              Plan - 12/21/16 1605    Clinical Impression Statement see above    Rehab Potential Good   PT Frequency 2x / week   PT Duration 6 weeks   PT Treatment/Interventions ADLs/Self Care Home Management;Other (comment)  debridement with dressing changes    PT Next Visit Plan Continue debridement and dressing change    Consulted and Agree with Plan of Care Patient      Patient will benefit from skilled therapeutic intervention in order to improve the following deficits and impairments:  Abnormal gait, Increased edema, Pain, Other (comment) (nonhealing wound )  Visit Diagnosis:  Diabetic ulcer of left midfoot associated with diabetes mellitus due to underlying condition, with muscle involvement without evidence of necrosis ([place])  Difficulty in walking, not elsewhere classified  Localized edema  Pain in left foot      G-Codes - 29-Dec-2016 1612    Functional Assessment Tool Used (Outpatient Only) clinical judgement depth and granulation of wound    Functional Limitation Other PT primary    Other PT Primary Current Status (N3567) At least 60 percent but less than 80 percent impaired, limited or restricted   Other PT Primary Goal Status (O1410) At least 1 percent but less than 20 percent impaired, limited or restricted      Problem List Patient Active Problem List   Diagnosis Date Noted  . Cellulitis of left foot 12/12/2016  . Nausea & vomiting 02/24/2016  . A-fib ([place]) 02/23/2016  . Hypothyroidism 02/23/2016  . CKD (chronic kidney disease), stage III 02/23/2016  . Anxiety 02/23/2016  . Chronic combined systolic (congestive) and diastolic (congestive) heart failure ([place]) 02/23/2016  . Dizziness 02/23/2016  . Open wound of right foot 02/23/2016  . Rectal bleeding 07/28/2015  . Chronic anticoagulation 07/28/2015  . PVD (peripheral vascular disease) ([place])   . S/P angioplasty 04/23/2015  . Peripheral arterial occlusive disease ([place]) 04/23/2015  . PAD (peripheral artery disease) ([place])   . Critical lower limb ischemia   . Toe ulcer, right ([place])   . Type II or unspecified type diabetes mellitus without mention of complication, uncontrolled 11/27/2013  . Acute respiratory failure with hypoxia ([place]) 11/26/2013  . CAP (community acquired pneumonia) 11/26/2013  . CHF exacerbation ([place]) 11/25/2013  . Cardiomyopathy, ischemic 06/13/2013  . At risk for sudden cardiac death 06-15-13  . S/P cardiac catheterization, Rt & Lt heart cath 06/05/2013 June 15, 2013  . Acute on chronic combined systolic and diastolic HF (heart failure), NYHA class 3 ([place]) 06/04/2013  . Atrial flutter with rapid ventricular response ([place]) 06/02/2013  . Acute renal failure: Cr up to 1.8 06/02/2013  . Hyperkalemia 06/02/2013    Class: Acute  . LBBB (left bundle branch block) 06/01/2013  . Hypotension- secondary to AF and Diltiazem Rx 06/01/2013  . Obesity (BMI 30-39.9) 06/01/2013  . Acute on chronic diastolic heart failure - due to Afib AVR 06/01/2013  . Atrial fibrillation with RVR- converted with  Amiodarone 05/31/2013  . Stroke- Lt brain 2003 04/08/2013  . Hemiplegia affecting right dominant side ([place]) 04/08/2013  . Cataract 04/08/2013  . GERD (gastroesophageal reflux disease) 04/08/2013  . S/P CABG x 3 - 2010 04/08/2013  . S/P AVR-tissue, 2010 04/08/2013  . DM2 (diabetes mellitus, type 2) ([place]) 04/08/2013  . Dyslipidemia 04/08/2013  . SUBACROMIAL BURSITIS, LEFT 11/05/2009   Rayetta Humphrey, PT CLT [phone] Dec 29, 2016, 4:13 PM  [place] [address] [place], [name], [address] Phone: [phone]   Fax:  [phone]  Name: [name] MRN: [id number] Date of Birth: 08/23/1945

## 2016-12-22 ENCOUNTER — Ambulatory Visit: Payer: Medicare Other | Admitting: *Deleted

## 2016-12-22 ENCOUNTER — Other Ambulatory Visit: Payer: Self-pay | Admitting: *Deleted

## 2016-12-22 NOTE — Patient Outreach (Signed)
[facility]) Care Management   12/22/2016  [name] 1945/11/29 [id number]  [name] is an 71 y.o. male with PMH of PVD, DM2, stroke, stroke, Afib, CAD,  Aortic valve replacement s/p 2010, PAF, on eliquis, AKI on CKD stage III and chronic combined systolic (congestive) and diastolic (congestive) heart failure who presented to the ED from his pcp [name] office complaining of worsening redness left foot pain, worsening edema and redness. [name] has previously worked with [facility]. [facility] hospital liaison assisted with getting THNConsent form signed by making his mark, stating he could not write.Patient gave [phone] the best number to reach him. He also gave written permission to call his niece [name] at [phone] he can not be reached.    [facility] CM received a referral to engage for transition of care calls and evaluate for monthly home visits. Per [facility] hospital liaison, the patient stated he was in need of a new glucose monitor and would like the strips to be delivered to his home through his pharmacy ([facility]). [name] was discharged 12/14/16, in stable condition on a 10 day course of doxycycline, order to increase activity slowly and a low sodium heart healthy diet.  He was admitted on 12/12/16 for left foot cellulitis, chronic wound, small ulcer plantar aspect of first metatarsal..  He was placed on IV antibiotics, ceftriaxone and clindamycin. IV vancomycin was avoided due to AKI  Subjective:  "I've been going to the wound care center" "'I'm suppose to stay off my foot as much as possible  Objective:   BP 130/72   Pulse 65   SpO2 95%   Review of Systems  Constitutional: Positive for malaise/fatigue. Negative for chills, diaphoresis, fever and weight loss.        im out of shape wt 158 to 141 now   HENT: Positive for hearing loss and tinnitus. Negative for congestion, ear discharge, ear pain, nosebleeds,  sinus pain and sore throat.        Had thumping in ear been to few hearing doctors can't afford a hearing aide   Eyes: Positive for redness. Negative for blurred vision, double vision, photophobia, pain and discharge.       Dust in eyes   Respiratory: Positive for cough. Negative for hemoptysis, sputum production, shortness of breath, wheezing and stridor.        Coughing at intervals   Cardiovascular: Negative.  Negative for palpitations, orthopnea, claudication, leg swelling and PND.  Gastrointestinal: Negative.  Negative for abdominal pain, blood in stool, constipation, diarrhea, heartburn, nausea and vomiting.  Genitourinary: Negative.  Negative for dysuria, flank pain, frequency, hematuria and urgency.  Musculoskeletal: Negative.  Negative for back pain, falls, joint pain, myalgias and neck pain.  Skin: Positive for itching and rash.       Rash has medicine for  And itching for mosquito bite  Neurological: Negative.  Negative for dizziness, tingling, tremors, sensory change, speech change, focal weakness, seizures, loss of consciousness, weakness and headaches.       Pmh vertigo   Endo/Heme/Allergies: Negative for environmental allergies and polydipsia. Bruises/bleeds easily.  Psychiatric/Behavioral: Negative.  Negative for depression, hallucinations, memory loss, substance abuse and suicidal ideas. The patient is not nervous/anxious and does not have insomnia.     Physical Exam  Constitutional: He is oriented to person, place, and time. He appears well-developed and well-nourished.  HENT:  Head: Normocephalic and atraumatic.  Eyes: Conjunctivae and EOM are normal. Pupils are  equal, round, and reactive to light.  Neck: Normal range of motion. Neck supple.  Cardiovascular: Normal rate, regular rhythm, normal heart sounds and intact distal pulses.   Respiratory: Effort normal and breath sounds normal.  GI: Soft. Bowel sounds are normal.  Musculoskeletal: Normal range of motion.   Neurological: He is alert and oriented to person, place, and time.  Skin: Skin is warm and dry.  Psychiatric: He has a normal mood and affect. His behavior is normal. Judgment and thought content normal.    Encounter Medications:   Outpatient Encounter Prescriptions as of 12/22/2016  Medication Sig  . acetaminophen (TYLENOL) 500 MG tablet Take 1,000 mg by mouth 2 (two) times daily as needed for headache. For pain/headaches  . alprazolam (XANAX) 2 MG tablet Take 1 mg by mouth 3 (three) times daily.   Marland Kitchen amiodarone (PACERONE) 200 MG tablet Take 1 tablet (200 mg total) by mouth daily.  Marland Kitchen apixaban (ELIQUIS) 5 MG TABS tablet Take 1 tablet (5 mg total) by mouth 2 (two) times daily.  Marland Kitchen atorvastatin (LIPITOR) 20 MG tablet Take 1 tablet (20 mg total) by mouth daily at 6 PM.  . docusate sodium (COLACE) 100 MG capsule Take 100 mg by mouth 2 (two) times daily.  Marland Kitchen doxycycline (VIBRAMYCIN) 100 MG capsule Take 1 capsule (100 mg total) by mouth 2 (two) times daily.  . fenofibrate (TRICOR) 145 MG tablet TAKE ONE TABLET EACH DAY.  . ferrous sulfate 325 (65 FE) MG EC tablet Take 325 mg by mouth daily with breakfast.  . gabapentin (NEURONTIN) 300 MG capsule Take 300 mg by mouth 2 (two) times daily.   . insulin glargine (LANTUS) 100 UNIT/ML injection Inject 0.6-0.75 mLs (60-75 Units total) into the skin 2 (two) times daily. 75 units in the morning then 60 units at bedtime  . insulin lispro (HUMALOG) 100 UNIT/ML injection Inject 0.35-0.5 mLs (35-50 Units total) into the skin 3 (three) times daily before meals. Patient states that he takes 35 units at breakfast, 50 units at dinner and 50 units at supper.  . INVOKANA 100 MG TABS tablet Take 1 tablet by mouth daily.  Marland Kitchen levothyroxine (SYNTHROID, LEVOTHROID) 25 MCG tablet Take 25 mcg by mouth daily before breakfast.  . meclizine (ANTIVERT) 25 MG tablet Take 25 mg by mouth 3 (three) times daily as needed for dizziness.  . metoprolol succinate (TOPROL-XL) 25 MG 24 hr  tablet Take 1 tablet (25 mg total) by mouth daily.  . Omega-3 Fatty Acids (FISH OIL) 1200 MG CAPS Take by mouth.  . pantoprazole (PROTONIX) 40 MG tablet Take 1 tablet (40 mg total) by mouth 2 (two) times daily.  Marland Kitchen torsemide (DEMADEX) 10 MG tablet Take 1 tablet (10 mg total) by mouth daily.   No facility-administered encounter medications on file as of 12/22/2016.     Functional Status:   In your present state of health, do you have any difficulty performing the following activities: 12/12/2016 02/24/2016  Hearing? N N  Vision? N Y  Difficulty concentrating or making decisions? N Y  Walking or climbing stairs? N Y  Dressing or bathing? N N  Doing errands, shopping? N N  Some recent data might be hidden    Fall/Depression Screening:    Fall Risk  12/28/2015 11/02/2015 09/30/2015  Falls in the past year? Yes Yes Yes  Number falls in past yr: 2 or more 1 1  Injury with Fall? No No No  Risk for fall due to : Impaired balance/gait - Impaired balance/gait  Risk for fall due to (comments): - - -  Follow up Falls prevention discussed - Falls prevention discussed   PHQ 2/9 Scores 11/25/2015 11/02/2015 09/30/2015 09/21/2015 08/25/2015 08/21/2015 07/30/2015  PHQ - 2 Score 0 2 2 2  0 0 0  PHQ- 9 Score - 7 7 7  - - -    Assessment:    Rickey Leichter is sitting in a high back chair In his home today with his tv very loud watching soap operas  Acute Medical Conditions: Denies any acute needs except his left foot wound in which he states is being cared for by the wound care clinic and he has been informed to "stay off of it"   Chronic Medical Conditions: (DM, CHF, afib, stroke) Rickey Grine needing redirection to stay on topic at hand Stop drinking 40 yr stopped smoking Has a grand child (male trailer ac) has 3 sisters living one close by pays a niece to clean home once a week Nephew lives next door  Had diarrhea a month ago Had colonoscopy Had pneumovax and flu shot last yr no like hospital from [place]  lived in [place]  Married x 2 divorced Niece, [name] arrived to check on him with her son, Edison Nasuti, who is very active and this makes Rickey Nuon anxious as noted by him frequently asking if he could sit down  Plan:   Discussed using his present glucometer versus his Need for a new glucometer  Assess his diet compliance -Likes hot dogs, chips cheese burger bacon shop from [employer], [employer]-Get drinks low sugar likes lemonade, tea   Provided a chart with good and not good foods on the low sodium heart healthy diet and discussed calorie counting and food labels  Note routed to care team members listed in EPIC CM will see pt in 1-2 weeks to follow up on transition of care and to see how wound is doing with assist of wound care clinic    [place]. Lavina Hamman, RN, BSN, [place] Care Management [phone]

## 2016-12-27 ENCOUNTER — Ambulatory Visit (HOSPITAL_COMMUNITY): Payer: Medicare Other | Admitting: Physical Therapy

## 2016-12-27 DIAGNOSIS — R262 Difficulty in walking, not elsewhere classified: Secondary | ICD-10-CM | POA: Diagnosis not present

## 2016-12-27 DIAGNOSIS — L97425 Non-pressure chronic ulcer of left heel and midfoot with muscle involvement without evidence of necrosis: Secondary | ICD-10-CM | POA: Diagnosis not present

## 2016-12-27 DIAGNOSIS — M79672 Pain in left foot: Secondary | ICD-10-CM | POA: Diagnosis not present

## 2016-12-27 DIAGNOSIS — R600 Localized edema: Secondary | ICD-10-CM | POA: Diagnosis not present

## 2016-12-27 DIAGNOSIS — E08621 Diabetes mellitus due to underlying condition with foot ulcer: Secondary | ICD-10-CM

## 2016-12-27 NOTE — Therapy (Signed)
[place], [name], [address] Phone: [phone]   Fax:  [phone]  Wound Care Therapy  Patient Details  Name: [name] MRN: [id number] Date of Birth: [birth year] Referring Provider: Nicholes Mango  Encounter Date: 12/27/2016      PT End of Session - 12/27/16 0852    Visit Number 2   Number of Visits 12   Date for PT Re-Evaluation 01/20/17   Authorization Type UHC medicare   Authorization - Visit Number 2   Authorization - Number of Visits 12   PT Start Time 0815   PT Stop Time 0845   PT Time Calculation (min) 30 min   Activity Tolerance Patient tolerated treatment well   Behavior During Therapy [facility] for tasks assessed/performed      Past Medical History:  Diagnosis Date  . At risk for sudden cardiac death Jun 26, 2013  . Cataract   . Chronic anticoagulation 05/2013   started  . Chronic kidney disease    Patient reports he is seeing Kidney doctor in September  . Coronary artery disease   . Diabetes mellitus    type 2   . Dyslipidemia   . GERD (gastroesophageal reflux disease)   . History of valve replacement 2010   [facility] Ease bioprosthetic 51mm  . Hypertension   . PAF (paroxysmal atrial fibrillation) ([place]) 05/2013   converted with amiodarone to SR  . S/P CABG x 3 2010  . S/P cardiac catheterization, Rt & Lt heart cath 06/05/2013 2013-06-26  . Stroke [facility]) 2003   R-sided weakness & upper extremity swelling   . Thyroid disease     Past Surgical History:  Procedure Laterality Date  . AORTIC VALVE REPLACEMENT  01/26/2009   [facility] Ease bioprosthetic 60mm valve  . CARDIAC CATHETERIZATION  06/05/2013   native 3 vessel disease; patent LIMA to LAD, SVG to OM and SVG to PDA; Elevated right and left heart filling pressures, with predominantly pulmonary venous hypertension, normal PVR  . COLONOSCOPY  2007   Dr. Aviva Signs: internal hemorrhoids  . COLONOSCOPY N/A 06/16/2016   Procedure: COLONOSCOPY;  Surgeon: Rogene Houston, MD;  [facility];  Service: Endoscopy;  Laterality: N/A;  1:45  . CORONARY ARTERY BYPASS GRAFT  01/26/2009   LIMA to LAD, SVG to circumflex, SVG to PDA  . GRAFT(S) ANGIOGRAM  06/05/2013   Procedure: GRAFT(S) Cyril Loosen;  Surgeon: Leonie Man, MD;  Location: [facility] CATH LAB;  Service: Cardiovascular;;  . IR GENERIC HISTORICAL  05/12/2016   IR RADIOLOGIST EVAL & MGMT 05/12/2016 Jacqulynn Cadet, MD [facility] INTERV RAD  . LEFT AND RIGHT HEART CATHETERIZATION WITH CORONARY ANGIOGRAM N/A 06/05/2013   Procedure: LEFT AND RIGHT HEART CATHETERIZATION WITH CORONARY ANGIOGRAM;  Surgeon: Leonie Man, MD;  Location: [facility] CATH LAB;  Service: Cardiovascular;  Laterality: N/A;  . TRANSTHORACIC ECHOCARDIOGRAM  09/2011   grade 1 diastolic dysfunction; increasing valve gradient; calcified MV annulus   . TRANSTHORACIC ECHOCARDIOGRAM  06/03/2013   EF 25-30%, mod conc. hypertrophy, grade 3 diastolic dysfunction; LA mod dilated; calcified MV annulus; transaortic gradients are normal for the bioprosthetic valve; inf vena cava dilated (elevated CVP) - LifeVest    There were no vitals filed for this visit.                  Wound Therapy - 12/27/16 0846    Subjective [name] states that his dressing fell of on Sunday so he redressed it the  best that he could.   Patient and Family Stated Goals wound to heal    Date of Onset 09/15/16  approximate   Prior Treatments self care, antibiotics, IP hospitalization    Pain Assessment 0-10   Pain Score 3    Pain Type Chronic pain   Pain Location Foot   Pain Orientation Left;Medial   Pain Descriptors / Indicators Sore   Pain Onset With Activity   Patients Stated Pain Goal 0   Pain Intervention(s) Distraction   Multiple Pain Sites No   Evaluation and Treatment Procedures Explained to Patient/Family Yes   Evaluation and Treatment Procedures agreed to   Wound Properties Date First Assessed:  12/12/16 Time First Assessed: 1658 Wound Type: Diabetic ulcer Location: Foot Location Orientation: Left;Upper Present on Admission: Yes   Dressing Type Gauze (Comment)  medihoney   Dressing Changed Changed   Dressing Status Clean;Dry;Intact   Dressing Change Frequency PRN   Site / Wound Assessment Dry   % Wound base Red or Granulating 60%   % Wound base Yellow/Fibrinous Exudate 40%   Peri-wound Assessment Edema;Erythema (blanchable)   Undermining (cm) entire wound   Margins Unattached edges (unapproximated)  callous surrounding wound    Drainage Amount None   Treatment Cleansed;Debridement (Selective)   Selective Debridement - Location callous area as well as wound bed   Selective Debridement - Tools Used Forceps;Scissors   Selective Debridement - Tissue Removed callous and slough   Wound Therapy - Clinical Statement Wound continues to undermine significantly.  Pt medial boarder of Lt great toe continues to be red.  Pt states that he has one more day of antibiotic left.  We will watch this area to make sure that things do not digress after pt's antibiotics are finished.    Wound Therapy - Functional Problem List --   Factors Delaying/Impairing Wound Healing Altered sensation;Diabetes Mellitus;Infection - systemic/local;Immobility;Multiple medical problems;Polypharmacy;Vascular compromise;Other (comment)  altered gait due to stroke.    Hydrotherapy Plan Debridement;Dressing change;Patient/family education;Pulsatile lavage with suction   Wound Therapy - Frequency --  2x a week for 6 weeks    Wound Therapy - Current Recommendations PT   Wound Plan cleansing, debridement and dressing change.  May use pulse lavage if indicated.    Dressing  medihoney 2x2, kling and netting.                    PT Short Term Goals - 12/27/16 0853      PT SHORT TERM GOAL #1   Title Pt wound depth to be no greater than .6   Time 3   Period Weeks   Status On-going     PT SHORT TERM GOAL #2    Title Callous around Lt plantar wound to be gone to decrease pressure placed on wound when walking    Time 3   Period Weeks   Status On-going     PT SHORT TERM GOAL #3   Title Redness and edema along LT 1st MTP to be gone to demonstrate the absence of infection.    Time 3   Period Weeks   Status On-going     PT SHORT TERM GOAL #4   Title Pt to verbalize the importance of keeping his sugar levels around 100 to allow healing of wound.   Time 3   Period Weeks   Status On-going           PT Long Term Goals - 12/27/16 6301      PT  LONG TERM GOAL #1   Title Wound depth to be no greater than .2 to allow pt to feel comfortable with self treatment.    Time 6   Period Weeks   Status On-going     PT LONG TERM GOAL #2   Title Wound on Lt plantar aspect to have no undermining to allow pt to feel cmforable with self treatment.    Time 6   Period Weeks   Status On-going               Plan - 12/27/16 8185    Clinical Impression Statement see above    Rehab Potential Good   PT Frequency 2x / week   PT Duration 6 weeks   PT Treatment/Interventions ADLs/Self Care Home Management;Other (comment)  debridement with dressing changes    PT Next Visit Plan Continue debridement and dressing change    Consulted and Agree with Plan of Care Patient      Patient will benefit from skilled therapeutic intervention in order to improve the following deficits and impairments:  Abnormal gait, Increased edema, Pain, Other (comment) (nonhealing wound )  Visit Diagnosis: Diabetic ulcer of left midfoot associated with diabetes mellitus due to underlying condition, with muscle involvement without evidence of necrosis (HCC)  Difficulty in walking, not elsewhere classified  Localized edema  Pain in left foot     Problem List Patient Active Problem List   Diagnosis Date Noted  . Cellulitis of left foot 12/12/2016  . Nausea & vomiting 02/24/2016  . A-fib ([place]) 02/23/2016  .  Hypothyroidism 02/23/2016  . CKD (chronic kidney disease), stage III 02/23/2016  . Anxiety 02/23/2016  . Chronic combined systolic (congestive) and diastolic (congestive) heart failure ([place]) 02/23/2016  . Dizziness 02/23/2016  . Open wound of right foot 02/23/2016  . Rectal bleeding 07/28/2015  . Chronic anticoagulation 07/28/2015  . PVD (peripheral vascular disease) ([place])   . S/P angioplasty 04/23/2015  . Peripheral arterial occlusive disease ([place]) 04/23/2015  . PAD (peripheral artery disease) ([place])   . Critical lower limb ischemia   . Toe ulcer, right ([place])   . Type II or unspecified type diabetes mellitus without mention of complication, uncontrolled 11/27/2013  . Acute respiratory failure with hypoxia ([place]) 11/26/2013  . CAP (community acquired pneumonia) 11/26/2013  . CHF exacerbation ([place]) 11/25/2013  . Cardiomyopathy, ischemic 06/13/2013  . At risk for sudden cardiac death June 16, 2013  . S/P cardiac catheterization, Rt & Lt heart cath 06/05/2013 Jun 16, 2013  . Acute on chronic combined systolic and diastolic HF (heart failure), NYHA class 3 ([place]) 06/04/2013  . Atrial flutter with rapid ventricular response ([place]) 06/02/2013  . Acute renal failure: Cr up to 1.8 06/02/2013  . Hyperkalemia 06/02/2013    Class: Acute  . LBBB (left bundle branch block) 06/01/2013  . Hypotension- secondary to AF and Diltiazem Rx 06/01/2013  . Obesity (BMI 30-39.9) 06/01/2013  . Acute on chronic diastolic heart failure - due to Afib AVR 06/01/2013  . Atrial fibrillation with RVR- converted with Amiodarone 05/31/2013  . Stroke- Lt brain 2003 04/08/2013  . Hemiplegia affecting right dominant side ([place]) 04/08/2013  . Cataract 04/08/2013  . GERD (gastroesophageal reflux disease) 04/08/2013  . S/P CABG x 3 - 2010 04/08/2013  . S/P AVR-tissue, 2010 04/08/2013  . DM2 (diabetes mellitus, type 2) ([place]) 04/08/2013  . Dyslipidemia 04/08/2013  . SUBACROMIAL BURSITIS, LEFT 11/05/2009    Rayetta Humphrey, PT  CLT [phone] 12/27/2016, 8:54 AM  [facility]  [address] [place], [name], [address] Phone: [phone]   Fax:  [phone]  Name: GABRYEL FILES MRN: [id number] Date of Birth: 08-11-1946

## 2016-12-29 ENCOUNTER — Ambulatory Visit (HOSPITAL_COMMUNITY): Payer: Medicare Other | Admitting: Physical Therapy

## 2016-12-29 DIAGNOSIS — R262 Difficulty in walking, not elsewhere classified: Secondary | ICD-10-CM

## 2016-12-29 DIAGNOSIS — R600 Localized edema: Secondary | ICD-10-CM | POA: Diagnosis not present

## 2016-12-29 DIAGNOSIS — L97425 Non-pressure chronic ulcer of left heel and midfoot with muscle involvement without evidence of necrosis: Secondary | ICD-10-CM | POA: Diagnosis not present

## 2016-12-29 DIAGNOSIS — E08621 Diabetes mellitus due to underlying condition with foot ulcer: Secondary | ICD-10-CM | POA: Diagnosis not present

## 2016-12-29 DIAGNOSIS — M79672 Pain in left foot: Secondary | ICD-10-CM | POA: Diagnosis not present

## 2016-12-29 NOTE — Therapy (Signed)
[place], [name], [address] Phone: [phone]   Fax:  [phone]  Wound Care Therapy  Patient Details  Name: [name] MRN: [id number] Date of Birth: Dec 04, 1945 Referring Provider: Nicholes Mango  Encounter Date: 12/29/2016      PT End of Session - 12/29/16 0912    Visit Number 3   Number of Visits 12   Date for PT Re-Evaluation 01/20/17   Authorization Type UHC medicare   Authorization - Visit Number 3   Authorization - Number of Visits 12   PT Start Time 0818   PT Stop Time 0853   PT Time Calculation (min) 35 min   Activity Tolerance Patient tolerated treatment well   Behavior During Therapy [facility] for tasks assessed/performed      Past Medical History:  Diagnosis Date  . At risk for sudden cardiac death 06/24/13  . Cataract   . Chronic anticoagulation 05/2013   started  . Chronic kidney disease    Patient reports he is seeing Kidney doctor in September  . Coronary artery disease   . Diabetes mellitus    type 2   . Dyslipidemia   . GERD (gastroesophageal reflux disease)   . History of valve replacement 2010   [facility] Ease bioprosthetic 67mm  . Hypertension   . PAF (paroxysmal atrial fibrillation) ([place]) 05/2013   converted with amiodarone to SR  . S/P CABG x 3 2010  . S/P cardiac catheterization, Rt & Lt heart cath 06/05/2013 2013/06/24  . Stroke [facility]) 2003   R-sided weakness & upper extremity swelling   . Thyroid disease     Past Surgical History:  Procedure Laterality Date  . AORTIC VALVE REPLACEMENT  01/26/2009   [facility] Ease bioprosthetic 40mm valve  . CARDIAC CATHETERIZATION  06/05/2013   native 3 vessel disease; patent LIMA to LAD, SVG to OM and SVG to PDA; Elevated right and left heart filling pressures, with predominantly pulmonary venous hypertension, normal PVR  . COLONOSCOPY  2007   Dr. Aviva Signs: internal hemorrhoids  . COLONOSCOPY N/A 06/16/2016   Procedure: COLONOSCOPY;  Surgeon: Rogene Houston, MD;  [facility];  Service: Endoscopy;  Laterality: N/A;  1:45  . CORONARY ARTERY BYPASS GRAFT  01/26/2009   LIMA to LAD, SVG to circumflex, SVG to PDA  . GRAFT(S) ANGIOGRAM  06/05/2013   Procedure: GRAFT(S) Cyril Loosen;  Surgeon: Leonie Man, MD;  Location: [facility] CATH LAB;  Service: Cardiovascular;;  . IR GENERIC HISTORICAL  05/12/2016   IR RADIOLOGIST EVAL & MGMT 05/12/2016 Jacqulynn Cadet, MD [facility] INTERV RAD  . LEFT AND RIGHT HEART CATHETERIZATION WITH CORONARY ANGIOGRAM N/A 06/05/2013   Procedure: LEFT AND RIGHT HEART CATHETERIZATION WITH CORONARY ANGIOGRAM;  Surgeon: Leonie Man, MD;  Location: [facility] CATH LAB;  Service: Cardiovascular;  Laterality: N/A;  . TRANSTHORACIC ECHOCARDIOGRAM  09/2011   grade 1 diastolic dysfunction; increasing valve gradient; calcified MV annulus   . TRANSTHORACIC ECHOCARDIOGRAM  06/03/2013   EF 25-30%, mod conc. hypertrophy, grade 3 diastolic dysfunction; LA mod dilated; calcified MV annulus; transaortic gradients are normal for the bioprosthetic valve; inf vena cava dilated (elevated CVP) - LifeVest    There were no vitals filed for this visit.                  Wound Therapy - 12/29/16 0908    Subjective Patient arrives stating he is doing well, his dressing stayed on but it is a  little sore today    Patient and Family Stated Goals wound to heal    Date of Onset 09/15/16   Prior Treatments self care, antibiotics, IP hospitalization    Pain Assessment 0-10   Pain Score 3    Pain Type Chronic pain   Pain Location Foot   Pain Orientation Left;Medial   Pain Descriptors / Indicators Sore   Pain Onset With Activity   Patients Stated Pain Goal 0   Pain Intervention(s) Distraction   Multiple Pain Sites No   Evaluation and Treatment Procedures Explained to Patient/Family Yes   Evaluation and Treatment Procedures agreed to   Wound Properties Date First Assessed: 12/12/16 Time First  Assessed: 1658 Wound Type: Diabetic ulcer Location: Foot Location Orientation: Left;Upper Present on Admission: Yes   Dressing Type Gauze (Comment)   Dressing Changed Changed   Dressing Status Clean;Dry;Intact   Dressing Change Frequency PRN   Peri-wound Assessment Edema;Erythema (blanchable)   Undermining (cm) entire wound    Margins Unattached edges (unapproximated)   Treatment Cleansed;Debridement (Selective);Packing (Impregnated strip)  cleansed, debridement, medihoney    Selective Debridement - Location callous, dry skin    Selective Debridement - Tools Used Forceps;Scalpel   Selective Debridement - Tissue Removed callous    Wound Therapy - Clinical Statement Patient arrives today reporting he is doing well but his foot is a bit sore. Removal of dressing reveals quite a bit of callous remaining as well as ongoing redness medial foot; patient reports he has finished antibiotics and we will plan to continue to monitor this. Continued debridement today, mostly of callous and dry skin around wound and wound bed. Did note some bleeding with debridement due to blood thinners but able to stop flow with extended sustained pressure on these sites.    Factors Delaying/Impairing Wound Healing Altered sensation;Diabetes Mellitus;Infection - systemic/local;Immobility;Multiple medical problems;Polypharmacy;Vascular compromise;Other (comment)   Hydrotherapy Plan Debridement;Dressing change;Patient/family education;Pulsatile lavage with suction   Wound Therapy - Frequency Other (comment)  2x/week for 6 weeks    Wound Therapy - Current Recommendations PT                 PT Education - 12/29/16 0912    Education provided Yes   Education Details wound status, change dressing if it comes off or gets wet, keep pressure off as able    Person(s) Educated Patient   Methods Explanation   Comprehension Verbalized understanding          PT Short Term Goals - 12/27/16 0853      PT SHORT TERM  GOAL #1   Title Pt wound depth to be no greater than .6   Time 3   Period Weeks   Status On-going     PT SHORT TERM GOAL #2   Title Callous around Lt plantar wound to be gone to decrease pressure placed on wound when walking    Time 3   Period Weeks   Status On-going     PT SHORT TERM GOAL #3   Title Redness and edema along LT 1st MTP to be gone to demonstrate the absence of infection.    Time 3   Period Weeks   Status On-going     PT SHORT TERM GOAL #4   Title Pt to verbalize the importance of keeping his sugar levels around 100 to allow healing of wound.   Time 3   Period Weeks   Status On-going           PT Long Term  Goals - 12/27/16 0853      PT LONG TERM GOAL #1   Title Wound depth to be no greater than .2 to allow pt to feel comfortable with self treatment.    Time 6   Period Weeks   Status On-going     PT LONG TERM GOAL #2   Title Wound on Lt plantar aspect to have no undermining to allow pt to feel cmforable with self treatment.    Time 6   Period Weeks   Status On-going             Patient will benefit from skilled therapeutic intervention in order to improve the following deficits and impairments:     Visit Diagnosis: Diabetic ulcer of left midfoot associated with diabetes mellitus due to underlying condition, with muscle involvement without evidence of necrosis ([place])  Difficulty in walking, not elsewhere classified     Problem List Patient Active Problem List   Diagnosis Date Noted  . Cellulitis of left foot 12/12/2016  . Nausea & vomiting 02/24/2016  . A-fib ([place]) 02/23/2016  . Hypothyroidism 02/23/2016  . CKD (chronic kidney disease), stage III 02/23/2016  . Anxiety 02/23/2016  . Chronic combined systolic (congestive) and diastolic (congestive) heart failure ([place]) 02/23/2016  . Dizziness 02/23/2016  . Open wound of right foot 02/23/2016  . Rectal bleeding 07/28/2015  . Chronic anticoagulation 07/28/2015  . PVD (peripheral  vascular disease) ([place])   . S/P angioplasty 04/23/2015  . Peripheral arterial occlusive disease ([place]) 04/23/2015  . PAD (peripheral artery disease) ([place])   . Critical lower limb ischemia   . Toe ulcer, right ([place])   . Type II or unspecified type diabetes mellitus without mention of complication, uncontrolled 11/27/2013  . Acute respiratory failure with hypoxia ([place]) 11/26/2013  . CAP (community acquired pneumonia) 11/26/2013  . CHF exacerbation ([place]) 11/25/2013  . Cardiomyopathy, ischemic 06/13/2013  . At risk for sudden cardiac death 07-Jul-2013  . S/P cardiac catheterization, Rt & Lt heart cath 06/05/2013 07-Jul-2013  . Acute on chronic combined systolic and diastolic HF (heart failure), NYHA class 3 ([place]) 06/04/2013  . Atrial flutter with rapid ventricular response ([place]) 06/02/2013  . Acute renal failure: Cr up to 1.8 06/02/2013  . Hyperkalemia 06/02/2013    Class: Acute  . LBBB (left bundle branch block) 06/01/2013  . Hypotension- secondary to AF and Diltiazem Rx 06/01/2013  . Obesity (BMI 30-39.9) 06/01/2013  . Acute on chronic diastolic heart failure - due to Afib AVR 06/01/2013  . Atrial fibrillation with RVR- converted with Amiodarone 05/31/2013  . Stroke- Lt brain 2003 04/08/2013  . Hemiplegia affecting right dominant side ([place]) 04/08/2013  . Cataract 04/08/2013  . GERD (gastroesophageal reflux disease) 04/08/2013  . S/P CABG x 3 - 2010 04/08/2013  . S/P AVR-tissue, 2010 04/08/2013  . DM2 (diabetes mellitus, type 2) ([place]) 04/08/2013  . Dyslipidemia 04/08/2013  . SUBACROMIAL BURSITIS, LEFT 11/05/2009    Deniece Ree PT, DPT [facility] [address] [place], [name], [address] Phone: [phone]   Fax:  [phone]  Name: NAZARIO RUSSOM MRN: [id number] Date of Birth: 01/16/1946

## 2017-01-02 ENCOUNTER — Ambulatory Visit (HOSPITAL_COMMUNITY): Payer: Medicare Other | Admitting: Physical Therapy

## 2017-01-02 DIAGNOSIS — R600 Localized edema: Secondary | ICD-10-CM

## 2017-01-02 DIAGNOSIS — M79672 Pain in left foot: Secondary | ICD-10-CM | POA: Diagnosis not present

## 2017-01-02 DIAGNOSIS — R262 Difficulty in walking, not elsewhere classified: Secondary | ICD-10-CM

## 2017-01-02 DIAGNOSIS — L97425 Non-pressure chronic ulcer of left heel and midfoot with muscle involvement without evidence of necrosis: Principal | ICD-10-CM

## 2017-01-02 DIAGNOSIS — E08621 Diabetes mellitus due to underlying condition with foot ulcer: Secondary | ICD-10-CM | POA: Diagnosis not present

## 2017-01-02 NOTE — Therapy (Signed)
[place], [name], [address] Phone: [phone]   Fax:  [phone]  Wound Care Therapy  Patient Details  Name: [name] MRN: [id number] Date of Birth: 1946-05-11 Referring Provider: Nicholes Mango  Encounter Date: 01/02/2017      PT End of Session - 01/02/17 1119    Visit Number 4   Number of Visits 12   Date for PT Re-Evaluation 01/20/17   Authorization Type [employer] medicare   Authorization - Visit Number 4   Authorization - Number of Visits 12   PT Start Time [phone]   PT Stop Time 0848   PT Time Calculation (min) 32 min   Activity Tolerance Patient tolerated treatment well   Behavior During Therapy [facility] for tasks assessed/performed      Past Medical History:  Diagnosis Date  . At risk for sudden cardiac death 2013-07-07  . Cataract   . Chronic anticoagulation 05/2013   started  . Chronic kidney disease    Patient reports he is seeing Kidney doctor in September  . Coronary artery disease   . Diabetes mellitus    type 2   . Dyslipidemia   . GERD (gastroesophageal reflux disease)   . History of valve replacement 2010   [facility] Ease bioprosthetic 8mm  . Hypertension   . PAF (paroxysmal atrial fibrillation) ([place]) 05/2013   converted with amiodarone to SR  . S/P CABG x 3 2010  . S/P cardiac catheterization, Rt & Lt heart cath 06/05/2013 Jul 07, 2013  . Stroke [facility]) 2003   R-sided weakness & upper extremity swelling   . Thyroid disease     Past Surgical History:  Procedure Laterality Date  . AORTIC VALVE REPLACEMENT  01/26/2009   [facility] Ease bioprosthetic 71mm valve  . CARDIAC CATHETERIZATION  06/05/2013   native 3 vessel disease; patent LIMA to LAD, SVG to OM and SVG to PDA; Elevated right and left heart filling pressures, with predominantly pulmonary venous hypertension, normal PVR  . COLONOSCOPY  2007   Dr. Aviva Signs: internal hemorrhoids  . COLONOSCOPY N/A 06/16/2016   Procedure: COLONOSCOPY;  Surgeon: Rogene Houston, MD;  [facility];  Service: Endoscopy;  Laterality: N/A;  1:45  . CORONARY ARTERY BYPASS GRAFT  01/26/2009   LIMA to LAD, SVG to circumflex, SVG to PDA  . GRAFT(S) ANGIOGRAM  06/05/2013   Procedure: GRAFT(S) Cyril Loosen;  Surgeon: Leonie Man, MD;  Location[facility] CATH LAB;  Service: Cardiovascular;;  . IR GENERIC HISTORICAL  05/12/2016   IR RADIOLOGIST EVAL & MGMT 05/12/2016 Jacqulynn Cadet, MD [facility] INTERV RAD  . LEFT AND RIGHT HEART CATHETERIZATION WITH CORONARY ANGIOGRAM N/A 06/05/2013   Procedure: LEFT AND RIGHT HEART CATHETERIZATION WITH CORONARY ANGIOGRAM;  Surgeon: Leonie Man, MD;  Location: [facility] CATH LAB;  Service: Cardiovascular;  Laterality: N/A;  . TRANSTHORACIC ECHOCARDIOGRAM  09/2011   grade 1 diastolic dysfunction; increasing valve gradient; calcified MV annulus   . TRANSTHORACIC ECHOCARDIOGRAM  06/03/2013   EF 25-30%, mod conc. hypertrophy, grade 3 diastolic dysfunction; LA mod dilated; calcified MV annulus; transaortic gradients are normal for the bioprosthetic valve; inf vena cava dilated (elevated CVP) - LifeVest    There were no vitals filed for this visit.                  Wound Therapy - 01/02/17 1114    Subjective Patient arrives stating he is doing well, his dressing stayed on but it is a  little sore today    Patient and Family Stated Goals wound to heal    Date of Onset 09/15/16   Prior Treatments self care, antibiotics, IP hospitalization    Pain Assessment 0-10   Pain Score 3    Pain Type Chronic pain   Pain Location Foot   Pain Orientation Left   Pain Descriptors / Indicators Sore   Pain Onset With Activity   Patients Stated Pain Goal 0   Pain Intervention(s) Distraction   Multiple Pain Sites No   Evaluation and Treatment Procedures Explained to Patient/Family Yes   Evaluation and Treatment Procedures agreed to   Wound Properties Date First Assessed: 12/12/16 Time First Assessed:  1658 Wound Type: Diabetic ulcer Location: Foot Location Orientation: Left;Upper Present on Admission: Yes   Dressing Type Gauze (Comment)   Dressing Changed Changed   Dressing Status Clean;Dry;Intact   Dressing Change Frequency PRN   Site / Wound Assessment Dry   % Wound base Red or Granulating 60%   % Wound base Yellow/Fibrinous Exudate 40%   Peri-wound Assessment Edema;Erythema (blanchable)   Wound Length (cm) 0.5 cm   Wound Width (cm) 0.5 cm   Wound Depth (cm) 0.5 cm   Undermining (cm) none present   Margins Unattached edges (unapproximated)   Drainage Amount Scant   Treatment Cleansed;Debridement (Selective)   Selective Debridement - Location callous, dry skin    Selective Debridement - Tools Used Forceps;Scalpel;Scissors   Selective Debridement - Tissue Removed callous    Wound Therapy - Clinical Statement Still with some erythema and edema present spanning to dorsum of foot.  Able to remove significant amount of callous from perimeter of wound with central pencil tip size opening centrally.  Continued with currenty treatment and dressing.   Factors Delaying/Impairing Wound Healing Altered sensation;Diabetes Mellitus;Infection - systemic/local;Immobility;Multiple medical problems;Polypharmacy;Vascular compromise;Other (comment)   Hydrotherapy Plan Debridement;Dressing change;Patient/family education;Pulsatile lavage with suction   Wound Therapy - Frequency Other (comment)  2x/week for 6 weeks    Wound Therapy - Current Recommendations PT   Wound Plan cleansing, debridement and dressing change.  May use pulse lavage if indicated.    Dressing  medihoney 2x2, kling and netting.                    PT Short Term Goals - 12/27/16 0853      PT SHORT TERM GOAL #1   Title Pt wound depth to be no greater than .6   Time 3   Period Weeks   Status On-going     PT SHORT TERM GOAL #2   Title Callous around Lt plantar wound to be gone to decrease pressure placed on wound when  walking    Time 3   Period Weeks   Status On-going     PT SHORT TERM GOAL #3   Title Redness and edema along LT 1st MTP to be gone to demonstrate the absence of infection.    Time 3   Period Weeks   Status On-going     PT SHORT TERM GOAL #4   Title Pt to verbalize the importance of keeping his sugar levels around 100 to allow healing of wound.   Time 3   Period Weeks   Status On-going           PT Long Term Goals - 12/27/16 1829      PT LONG TERM GOAL #1   Title Wound depth to be no greater than .2 to allow pt to feel  comfortable with self treatment.    Time 6   Period Weeks   Status On-going     PT LONG TERM GOAL #2   Title Wound on Lt plantar aspect to have no undermining to allow pt to feel cmforable with self treatment.    Time 6   Period Weeks   Status On-going             Patient will benefit from skilled therapeutic intervention in order to improve the following deficits and impairments:     Visit Diagnosis: Diabetic ulcer of left midfoot associated with diabetes mellitus due to underlying condition, with muscle involvement without evidence of necrosis ([place])  Difficulty in walking, not elsewhere classified  Localized edema  Pain in left foot     Problem List Patient Active Problem List   Diagnosis Date Noted  . Cellulitis of left foot 12/12/2016  . Nausea & vomiting 02/24/2016  . A-fib ([place]) 02/23/2016  . Hypothyroidism 02/23/2016  . CKD (chronic kidney disease), stage III 02/23/2016  . Anxiety 02/23/2016  . Chronic combined systolic (congestive) and diastolic (congestive) heart failure ([place]) 02/23/2016  . Dizziness 02/23/2016  . Open wound of right foot 02/23/2016  . Rectal bleeding 07/28/2015  . Chronic anticoagulation 07/28/2015  . PVD (peripheral vascular disease) ([place])   . S/P angioplasty 04/23/2015  . Peripheral arterial occlusive disease ([place]) 04/23/2015  . PAD (peripheral artery disease) ([place])   . Critical lower limb ischemia    . Toe ulcer, right ([place])   . Type II or unspecified type diabetes mellitus without mention of complication, uncontrolled 11/27/2013  . Acute respiratory failure with hypoxia ([place]) 11/26/2013  . CAP (community acquired pneumonia) 11/26/2013  . CHF exacerbation ([place]) 11/25/2013  . Cardiomyopathy, ischemic 06/13/2013  . At risk for sudden cardiac death 06-25-13  . S/P cardiac catheterization, Rt & Lt heart cath 06/05/2013 Jun 25, 2013  . Acute on chronic combined systolic and diastolic HF (heart failure), NYHA class 3 ([place]) 06/04/2013  . Atrial flutter with rapid ventricular response ([name]) 06/02/2013  . Acute renal failure: Cr up to 1.8 06/02/2013  . Hyperkalemia 06/02/2013    Class: Acute  . LBBB (left bundle branch block) 06/01/2013  . Hypotension- secondary to AF and Diltiazem Rx 06/01/2013  . Obesity (BMI 30-39.9) 06/01/2013  . Acute on chronic diastolic heart failure - due to Afib AVR 06/01/2013  . Atrial fibrillation with RVR- converted with Amiodarone 05/31/2013  . Stroke- Lt brain 2003 04/08/2013  . Hemiplegia affecting right dominant side ([place]) 04/08/2013  . Cataract 04/08/2013  . GERD (gastroesophageal reflux disease) 04/08/2013  . S/P CABG x 3 - 2010 04/08/2013  . S/P AVR-tissue, 2010 04/08/2013  . DM2 (diabetes mellitus, type 2) ([place]) 04/08/2013  . Dyslipidemia 04/08/2013  . SUBACROMIAL BURSITIS, LEFT 11/05/2009    Teena Irani, PTA/CLT [phone]  01/02/2017, 11:21 AM  [place], [name], [address] Phone: [phone]   Fax:  [phone]  Name: [name] MRN: [id number] Date of Birth: 1945-12-17

## 2017-01-04 ENCOUNTER — Ambulatory Visit
Admission: RE | Admit: 2017-01-04 | Discharge: 2017-01-04 | Disposition: A | Discharge status: Home / Self Care | Payer: Medicare Other | Source: Ambulatory Visit | Attending: Interventional Radiology | Admitting: Interventional Radiology

## 2017-01-04 ENCOUNTER — Other Ambulatory Visit (HOSPITAL_COMMUNITY): Payer: Self-pay | Admitting: Interventional Radiology

## 2017-01-04 DIAGNOSIS — I739 Peripheral vascular disease, unspecified: Secondary | ICD-10-CM

## 2017-01-04 HISTORY — PX: IR RADIOLOGIST EVAL & MGMT: IMG5224

## 2017-01-04 NOTE — Progress Notes (Signed)
Referring Physician(s): Bradford,W  Chief Complaint: The patient is seen in follow up today s/p right lower extremity arteriogram with recanalization of a right SFA occlusion and angioplasty/stenting of the right SFA and popliteal artery on 04/23/15; atherectomy and drug-eluting balloon angioplasty of distal SFA/popliteal stenosis as well as atherectomy and angioplasty of peroneal artery stenosis on 10/07/15. Marland Kitchen   History of present illness: [name] is a 71 year old male with past medical history  significant for peripheral vascular disease, diabetes, stroke, aortic valve replacement, paroxysmal atrial fibrillation on eliquis, and chronic kidney disease stage III .  He also had history of category 5 critical limb ischemia involving the right lower extremity with a slowly healing ulcer on the right great toe. On 04/23/15 he underwent right lower extremity arteriogram with recanalization of the right SFA occlusion and angioplasty/stenting of the right SFA and popliteal artery. On 10/07/15 he underwent atherectomy and drug-eluting balloon angioplasty of the distal SFA/popliteal stenosis as well as atherectomy and angioplasty of a peroneal artery stenosis. There was unsuccessful attempts at recannalization of chronically occluded anterior tibial /posterior tibial arteries on the right.He presents again today after recent hospitalization at [facility] from 12/12/16-12/14/16 for treatment of left foot cellulitis and chronic wound at the plantar aspect of the first metatarsal. MRI was negative for osteomyelitis the positive for possible myositis and cellulitis. Patient was initiated on antibiotic therapy and is continuing to  receive wound care therapy twice a week as an outpatient.He has made good progress with wound healing. Latest ABI measurements from 12/12/16 were 0.41 on the right and 0.62 on the left, previously 0.66 and 0.61 respectively. He currently denies fever, headache, chest pain, abdominal/back  pain,groin access discomfort, nausea, vomiting. He does notice some occasional blood on toilet paper after bowel movements.He continues to have paresthesias of the right and left lower extremities but less than previous. He denies significant right lower extremity pain. He does have some soreness over the plantar surface right foot at base of great toe. Past Medical History:  Diagnosis Date  . At risk for sudden cardiac death 06/20/2013  . Cataract   . Chronic anticoagulation 05/2013   started  . Chronic kidney disease    Patient reports he is seeing Kidney doctor in September  . Coronary artery disease   . Diabetes mellitus    type 2   . Dyslipidemia   . GERD (gastroesophageal reflux disease)   . History of valve replacement 2010   [facility] Ease bioprosthetic 30mm  . Hypertension   . PAF (paroxysmal atrial fibrillation) ([place]) 05/2013   converted with amiodarone to SR  . S/P CABG x 3 2010  . S/P cardiac catheterization, Rt & Lt heart cath 06/05/2013 20-Jun-2013  . Stroke [facility]) 2003   R-sided weakness & upper extremity swelling   . Thyroid disease     Past Surgical History:  Procedure Laterality Date  . AORTIC VALVE REPLACEMENT  01/26/2009   [facility] Ease bioprosthetic 67mm valve  . CARDIAC CATHETERIZATION  06/05/2013   native 3 vessel disease; patent LIMA to LAD, SVG to OM and SVG to PDA; Elevated right and left heart filling pressures, with predominantly pulmonary venous hypertension, normal PVR  . COLONOSCOPY  2007   Dr. Aviva Signs: internal hemorrhoids  . COLONOSCOPY N/A 06/16/2016   Procedure: COLONOSCOPY;  Surgeon: Rogene Houston, MD;  [facility];  Service: Endoscopy;  Laterality: N/A;  1:45  . CORONARY ARTERY BYPASS GRAFT  01/26/2009   LIMA to  LAD, SVG to circumflex, SVG to PDA  . GRAFT(S) ANGIOGRAM  06/05/2013   Procedure: GRAFT(S) Cyril Loosen;  Surgeon: Leonie Man, MD;  Location: [facility] CATH LAB;  Service: Cardiovascular;;  . IR GENERIC HISTORICAL   05/12/2016   IR RADIOLOGIST EVAL & MGMT 05/12/2016 Jacqulynn Cadet, MD [facility] INTERV RAD  . LEFT AND RIGHT HEART CATHETERIZATION WITH CORONARY ANGIOGRAM N/A 06/05/2013   Procedure: LEFT AND RIGHT HEART CATHETERIZATION WITH CORONARY ANGIOGRAM;  Surgeon: Leonie Man, MD;  Location: [facility] CATH LAB;  Service: Cardiovascular;  Laterality: N/A;  . TRANSTHORACIC ECHOCARDIOGRAM  09/2011   grade 1 diastolic dysfunction; increasing valve gradient; calcified MV annulus   . TRANSTHORACIC ECHOCARDIOGRAM  06/03/2013   EF 25-30%, mod conc. hypertrophy, grade 3 diastolic dysfunction; LA mod dilated; calcified MV annulus; transaortic gradients are normal for the bioprosthetic valve; inf vena cava dilated (elevated CVP) - LifeVest    Allergies: Patient has no known allergies.  Medications: Prior to Admission medications   Medication Sig Start Date End Date Taking? Authorizing Provider  acetaminophen (TYLENOL) 500 MG tablet Take 1,000 mg by mouth 2 (two) times daily as needed for headache. For pain/headaches   Yes [provider]  alprazolam Duanne Moron) 2 MG tablet Take 1 mg by mouth 3 (three) times daily.    Yes [provider]  amiodarone (PACERONE) 200 MG tablet Take 1 tablet (200 mg total) by mouth daily. 10/14/16  Yes Hilty, Nadean Corwin, MD  apixaban (ELIQUIS) 5 MG TABS tablet Take 1 tablet (5 mg total) by mouth 2 (two) times daily. 10/14/16  Yes Hilty, Nadean Corwin, MD  atorvastatin (LIPITOR) 20 MG tablet Take 1 tablet (20 mg total) by mouth daily at 6 PM. 06/18/16  Yes Rehman, Mechele Dawley, MD  docusate sodium (COLACE) 100 MG capsule Take 100 mg by mouth 2 (two) times daily.   Yes [provider]  doxycycline (VIBRAMYCIN) 100 MG capsule Take 1 capsule (100 mg total) by mouth 2 (two) times daily. 12/14/16  Yes Isaac Bliss, Rayford Halsted, MD  fenofibrate (TRICOR) 145 MG tablet TAKE ONE TABLET EACH DAY. 11/01/16  Yes Hilty, Nadean Corwin, MD  gabapentin (NEURONTIN) 300 MG capsule Take 300 mg by mouth 2  (two) times daily.    Yes [provider]  insulin glargine (LANTUS) 100 UNIT/ML injection Inject 0.6-0.75 mLs (60-75 Units total) into the skin 2 (two) times daily. 75 units in the morning then 60 units at bedtime 02/25/16  Yes Hongalgi, Lenis Dickinson, MD  insulin lispro (HUMALOG) 100 UNIT/ML injection Inject 0.35-0.5 mLs (35-50 Units total) into the skin 3 (three) times daily before meals. Patient states that he takes 35 units at breakfast, 50 units at dinner and 50 units at supper. 02/25/16  Yes Hongalgi, Lenis Dickinson, MD  INVOKANA 100 MG TABS tablet Take 1 tablet by mouth daily. 12/09/16  Yes [provider]  levothyroxine (SYNTHROID, LEVOTHROID) 25 MCG tablet Take 25 mcg by mouth daily before breakfast.   Yes [provider]  meclizine (ANTIVERT) 25 MG tablet Take 25 mg by mouth 3 (three) times daily as needed for dizziness.   Yes [provider]  metoprolol succinate (TOPROL-XL) 25 MG 24 hr tablet Take 1 tablet (25 mg total) by mouth daily. 10/14/16  Yes Hilty, Nadean Corwin, MD  Omega-3 Fatty Acids (FISH OIL) 1200 MG CAPS Take by mouth.   Yes [provider]  pantoprazole (PROTONIX) 40 MG tablet Take 1 tablet (40 mg total) by mouth 2 (two) times daily. 10/30/14  Yes Hilty, Nadean Corwin, MD  torsemide (DEMADEX) 10 MG tablet Take 1 tablet (10 mg total) by mouth daily. 10/14/16  Yes Hilty, Nadean Corwin, MD  ferrous sulfate 325 (65 FE) MG EC tablet Take 325 mg by mouth daily with breakfast.    [provider]     Family History  Problem Relation Age of Onset  . Heart attack Mother   . Liver disease Brother   . Diabetes Sister        x4    Social History   Social History  . Marital status: Divorced    Spouse name: N/A  . Number of children: 4  . Years of education: N/A   Occupational History  . 4 Retired   Social History Main Topics  . Smoking status: Former Smoker    Types: Cigarettes    Quit date: 08/15/2001  . Smokeless tobacco: Former Systems developer    Types:  Chew     Comment: Quit in 2003  . Alcohol use No  . Drug use: No  . Sexual activity: Not Currently   Other Topics Concern  . Not on file   Social History Narrative  . No narrative on file     Vital Signs: BP (!) 140/54   Pulse 68   Temp 97.8 F (36.6 C) (Oral)   Resp 16   Ht 5' 10.5" (1.791 m)   Wt 241 lb (109.3 kg)   SpO2 95%   BMI 34.09 kg/m   Physical Exam patient awake, alert. chest clear to auscultation bilaterally anteriorly. Heart with regular rate and rhythm, positive murmur. Abdomen obese, soft, positive bowel sounds, nontender. Right lower extremity with 2+ edema, +/- distal pulses, both feet were warm to touch; healing wound on the plantar aspect of first metatarsal left foot, slightly tender to palpation; no left lower extremity edema  Imaging: No results found.  Labs:  CBC:  Recent Labs  02/23/16 1956 02/23/16 2026 12/12/16 1259 12/13/16 0534  WBC 6.3  --  8.1 7.7  HGB 13.2 13.3 13.7 12.9*  HCT 39.6 39.0 40.3 37.7*  PLT 134*  --  242 244    COAGS:  Recent Labs  02/23/16 1956  INR 1.20  APTT 33    BMP:  Recent Labs  02/25/16 0817 12/12/16 1259 12/13/16 0534 12/14/16 0531  NA 135 136 134* 133*  K 4.3 4.4 4.3 4.4  CL 99* 102 101 103  CO2 26 23 24 23   GLUCOSE 255* 252* 156* 206*  BUN 17 26* 21* 20  CALCIUM 9.0 9.0 8.7* 8.8*  CREATININE 1.27* 1.88* 1.50* 1.38*  GFRNONAA 57* 35* 46* 51*  GFRAA >60 41* 53* 59*    LIVER FUNCTION TESTS:  Recent Labs  02/23/16 1956 04/14/16 1059 12/12/16 1259  BILITOT 0.8 0.5 0.4  AST 52* 41* 29  ALT 62 52* 29  ALKPHOS 43 39* 48  PROT 6.2* 6.6 7.0  ALBUMIN 4.0 4.3 3.8    Assessment/plan: 71 year old male with past medical history  significant for peripheral vascular disease, diabetes, stroke, aortic valve replacement, paroxysmal atrial fibrillation on eliquis, and chronic kidney disease stage III .  He also had history of category 5 critical limb ischemia involving the right lower  extremity with a slowly healing ulcer on the right great toe. On 04/23/15 he underwent right lower extremity arteriogram with recanalization of the right SFA occlusion and angioplasty/stenting of the right SFA and popliteal artery. On 10/07/15 he underwent atherectomy and drug-eluting balloon angioplasty of the distal  SFA/popliteal stenosis as well as atherectomy and angioplasty of a peroneal artery stenosis. There was unsuccessful attempts at recannalization of chronically occluded anterior tibial /posterior tibial arteries on the right.He presents again today after recent hospitalization at [facility] from 12/12/16-12/14/16 for treatment of left foot cellulitis and chronic wound at the plantar aspect of the first metatarsal. MRI was negative for osteomyelitis but positive for possible myositis and cellulitis. Blood cultures were negative. Patient was initiated on antibiotic therapy and is continuing to  receive wound care therapy twice a week as an outpatient.He has made good progress with wound healing. Latest ABI measurements from 12/12/16 were 0.41 on the right and 0.62 on the left, previously 0.66 and 0.61 respectively. At this time plan to continue patient on current course of treatment with antibiotic therapy and wound care; should he become more symptomatic with respect to claudication and delayed healing of left foot wound would consider angiography to improve arterial inflow. He will be scheduled for follow-up IR clinic visit in 4-6 weeks. He was told to contact our service in the interim with any additional questions or concern.   Signed: D. Rowe Robert, PA-C 01/04/2017, 3:49 PM   Please refer to Dr.McCullough's attestation of this note for management and plan.      Patient ID: [name], male   DOB: February 29, 1946, 71 y.o.   MRN: [id number]

## 2017-01-05 ENCOUNTER — Ambulatory Visit (HOSPITAL_COMMUNITY): Payer: Medicare Other | Admitting: Physical Therapy

## 2017-01-05 ENCOUNTER — Other Ambulatory Visit: Payer: Self-pay | Admitting: *Deleted

## 2017-01-05 DIAGNOSIS — R262 Difficulty in walking, not elsewhere classified: Secondary | ICD-10-CM

## 2017-01-05 DIAGNOSIS — M79672 Pain in left foot: Secondary | ICD-10-CM

## 2017-01-05 DIAGNOSIS — L97425 Non-pressure chronic ulcer of left heel and midfoot with muscle involvement without evidence of necrosis: Principal | ICD-10-CM

## 2017-01-05 DIAGNOSIS — R600 Localized edema: Secondary | ICD-10-CM

## 2017-01-05 DIAGNOSIS — E08621 Diabetes mellitus due to underlying condition with foot ulcer: Secondary | ICD-10-CM | POA: Diagnosis not present

## 2017-01-05 NOTE — Therapy (Signed)
[place], [name], [address] Phone: [phone]   Fax:  [phone]  Wound Care Therapy  Patient Details  Name: [name] MRN: [id number] Date of Birth: Jan 19, 1946 Referring Provider: Nicholes Mango  Encounter Date: 01/05/2017      PT End of Session - 01/05/17 0938    Visit Number 5   Number of Visits 12   Date for PT Re-Evaluation 01/20/17   Authorization Type [employer] medicare   Authorization - Visit Number 5   Authorization - Number of Visits 12   PT Start Time 0902   PT Stop Time 0922   PT Time Calculation (min) 20 min   Activity Tolerance Patient tolerated treatment well   Behavior During Therapy [facility] for tasks assessed/performed      Past Medical History:  Diagnosis Date  . At risk for sudden cardiac death 06/15/2013  . Cataract   . Chronic anticoagulation 05/2013   started  . Chronic kidney disease    Patient reports he is seeing Kidney doctor in September  . Coronary artery disease   . Diabetes mellitus    type 2   . Dyslipidemia   . GERD (gastroesophageal reflux disease)   . History of valve replacement 2010   [facility] Ease bioprosthetic 30mm  . Hypertension   . PAF (paroxysmal atrial fibrillation) ([place]) 05/2013   converted with amiodarone to SR  . S/P CABG x 3 2010  . S/P cardiac catheterization, Rt & Lt heart cath 06/05/2013 Jun 15, 2013  . Stroke [facility]) 2003   R-sided weakness & upper extremity swelling   . Thyroid disease     Past Surgical History:  Procedure Laterality Date  . AORTIC VALVE REPLACEMENT  01/26/2009   [facility] Ease bioprosthetic 63mm valve  . CARDIAC CATHETERIZATION  06/05/2013   native 3 vessel disease; patent LIMA to LAD, SVG to OM and SVG to PDA; Elevated right and left heart filling pressures, with predominantly pulmonary venous hypertension, normal PVR  . COLONOSCOPY  2007   Dr. Aviva Signs: internal hemorrhoids  . COLONOSCOPY N/A 06/16/2016   Procedure: COLONOSCOPY;  Surgeon: Rogene Houston, MD;  Location: [facility] ENDO SUITE;  Service: Endoscopy;  Laterality: N/A;  1:45  . CORONARY ARTERY BYPASS GRAFT  01/26/2009   LIMA to LAD, SVG to circumflex, SVG to PDA  . GRAFT(S) ANGIOGRAM  06/05/2013   Procedure: GRAFT(S) Cyril Loosen;  Surgeon: Leonie Man, MD;  [facility] C[facility] LAB;  Service: Cardiovascular;;  . IR GENERIC HISTORICAL  05/12/2016   IR RADIOLOGIST EVAL & MGMT 05/12/2016 Jacqulynn Cadet, MD [facility] INTERV RAD  . LEFT AND RIGHT HEART CATHETERIZATION WITH CORONARY ANGIOGRAM N/A 06/05/2013   Procedure: LEFT AND RIGHT HEART CATHETERIZATION WITH CORONARY ANGIOGRAM;  Surgeon: Leonie Man, MD;  Location: [facility] CATH LAB;  Service: Cardiovascular;  Laterality: N/A;  . TRANSTHORACIC ECHOCARDIOGRAM  09/2011   grade 1 diastolic dysfunction; increasing valve gradient; calcified MV annulus   . TRANSTHORACIC ECHOCARDIOGRAM  06/03/2013   EF 25-30%, mod conc. hypertrophy, grade 3 diastolic dysfunction; LA mod dilated; calcified MV annulus; transaortic gradients are normal for the bioprosthetic valve; inf vena cava dilated (elevated CVP) - LifeVest    There were no vitals filed for this visit.                  Wound Therapy - 01/05/17 0934    Subjective Pt arrives with dressing intact.  States he went back to the stent doctor and  he is pleased with his progress.   Patient and Family Stated Goals wound to heal    Date of Onset 09/15/16   Prior Treatments self care, antibiotics, IP hospitalization    Pain Assessment No/denies pain   Evaluation and Treatment Procedures Explained to Patient/Family Yes   Evaluation and Treatment Procedures agreed to   Wound Properties Date First Assessed: 12/12/16 Time First Assessed: 5366 Wound Type: Diabetic ulcer Location: Foot Location Orientation: Left;Upper Present on Admission: Yes   Dressing Type Gauze (Comment)   Dressing Status Clean;Dry;Intact   Dressing Change Frequency PRN   Site / Wound  Assessment Dry   % Wound base Red or Granulating 85%   % Wound base Yellow/Fibrinous Exudate 15%   Peri-wound Assessment Edema;Erythema (blanchable)   Margins Unattached edges (unapproximated)   Drainage Amount Scant   Treatment Cleansed;Debridement (Selective)   Selective Debridement - Location callous, dry skin    Selective Debridement - Tools Used Forceps;Scissors   Selective Debridement - Tissue Removed callous, undermining   Wound Therapy - Clinical Statement all redness and edema is absent now.  Dressing still intact.  noted undermining perimeter of wound that was easily removed.  Still with small, pinpoint hole in central wound, however unable to pack any dressing into this.  Applied medihoney gel to gauze and secured over wound.  Finished with #5 netting and secured with sock.    Factors Delaying/Impairing Wound Healing Altered sensation;Diabetes Mellitus;Infection - systemic/local;Immobility;Multiple medical problems;Polypharmacy;Vascular compromise;Other (comment)   Hydrotherapy Plan Debridement;Dressing change;Patient/family education;Pulsatile lavage with suction   Wound Therapy - Frequency Other (comment)  2x/week for 6 weeks    Wound Therapy - Current Recommendations PT   Wound Plan cleansing, debridement and dressing change.  May use pulse lavage if indicated.   Measure next session.   Dressing  medihoney 2x2, kling and netting.                    PT Short Term Goals - 12/27/16 0853      PT SHORT TERM GOAL #1   Title Pt wound depth to be no greater than .6   Time 3   Period Weeks   Status On-going     PT SHORT TERM GOAL #2   Title Callous around Lt plantar wound to be gone to decrease pressure placed on wound when walking    Time 3   Period Weeks   Status On-going     PT SHORT TERM GOAL #3   Title Redness and edema along LT 1st MTP to be gone to demonstrate the absence of infection.    Time 3   Period Weeks   Status On-going     PT SHORT TERM GOAL #4    Title Pt to verbalize the importance of keeping his sugar levels around 100 to allow healing of wound.   Time 3   Period Weeks   Status On-going           PT Long Term Goals - 12/27/16 0853      PT LONG TERM GOAL #1   Title Wound depth to be no greater than .2 to allow pt to feel comfortable with self treatment.    Time 6   Period Weeks   Status On-going     PT LONG TERM GOAL #2   Title Wound on Lt plantar aspect to have no undermining to allow pt to feel cmforable with self treatment.    Time 6   Period Weeks  Status On-going             Patient will benefit from skilled therapeutic intervention in order to improve the following deficits and impairments:     Visit Diagnosis: Diabetic ulcer of left midfoot associated with diabetes mellitus due to underlying condition, with muscle involvement without evidence of necrosis ([place])  Difficulty in walking, not elsewhere classified  Localized edema  Pain in left foot     Problem List Patient Active Problem List   Diagnosis Date Noted  . Cellulitis of left foot 12/12/2016  . Nausea & vomiting 02/24/2016  . A-fib ([place]) 02/23/2016  . Hypothyroidism 02/23/2016  . CKD (chronic kidney disease), stage III 02/23/2016  . Anxiety 02/23/2016  . Chronic combined systolic (congestive) and diastolic (congestive) heart failure ([place]) 02/23/2016  . Dizziness 02/23/2016  . Open wound of right foot 02/23/2016  . Rectal bleeding 07/28/2015  . Chronic anticoagulation 07/28/2015  . PVD (peripheral vascular disease) ([place])   . S/P angioplasty 04/23/2015  . Peripheral arterial occlusive disease ([place]) 04/23/2015  . PAD (peripheral artery disease) ([place])   . Critical lower limb ischemia   . Toe ulcer, right ([place])   . Type II or unspecified type diabetes mellitus without mention of complication, uncontrolled 11/27/2013  . Acute respiratory failure with hypoxia ([place]) 11/26/2013  . CAP (community acquired pneumonia) 11/26/2013  .  CHF exacerbation ([place]) 11/25/2013  . Cardiomyopathy, ischemic 06/13/2013  . At risk for sudden cardiac death June 11, 2013  . S/P cardiac catheterization, Rt & Lt heart cath 06/05/2013 06-11-13  . Acute on chronic combined systolic and diastolic HF (heart failure), NYHA class 3 ([place]) 06/04/2013  . Atrial flutter with rapid ventricular response ([place]) 06/02/2013  . Acute renal failure: Cr up to 1.8 06/02/2013  . Hyperkalemia 06/02/2013    Class: Acute  . LBBB (left bundle branch block) 06/01/2013  . Hypotension- secondary to AF and Diltiazem Rx 06/01/2013  . Obesity (BMI 30-39.9) 06/01/2013  . Acute on chronic diastolic heart failure - due to Afib AVR 06/01/2013  . Atrial fibrillation with RVR- converted with Amiodarone 05/31/2013  . Stroke- Lt brain 2003 04/08/2013  . Hemiplegia affecting right dominant side ([place]) 04/08/2013  . Cataract 04/08/2013  . GERD (gastroesophageal reflux disease) 04/08/2013  . S/P CABG x 3 - 2010 04/08/2013  . S/P AVR-tissue, 2010 04/08/2013  . DM2 (diabetes mellitus, type 2) ([place]) 04/08/2013  . Dyslipidemia 04/08/2013  . SUBACROMIAL BURSITIS, LEFT 11/05/2009    Teena Irani, PTA/CLT [phone]  01/05/2017, 9:40 AM  [place] [address] [place], [name], [address] Phone: [phone]   Fax:  [phone]  Name: [name] MRN: [id number] Date of Birth: 04-16-1946

## 2017-01-05 NOTE — Patient Outreach (Signed)
[name]) Care Management   01/05/2017  [name] 1946/04/09 [id number]  [name] is an 71 y.o. male  with PMH of PVD, DM2, stroke, Afib, CAD,  Aortic valve replacement s/p 2010, PAF, on eliquis, AKI on CKD stage III and chronic combined systolic (congestive) and diastolic (congestive) heart failure who presented to the ED from his pcp [name] complaining of worsening redness left foot pain, worsening edema and redness. [name] has previously worked with [name]. [name] [name] liaison assisted with getting [name] form signed by making his mark, stating he could not write.Patient gave [phone] the best number to reach him. He also gave written permission to call his niece [name] at [phone] he can not be reached.    [facility]N CM received a referral to engage for transition of care calls and evaluate for monthly home visits. Per [name] [name] liaison, the patient stated he was in need of a new glucose monitor and would like the strips to be delivered to his home through his pharmacy ([facility]). [name] discharged 12/14/16, in stable condition on a 10 day course of doxycycline, order to increase activity slowly and a low sodium heart healthy diet. He was admitted on 12/12/16 for left foot cellulitis, chronic wound, small ulcer plantar aspect of firstmetatarsal.. He was placed on IV antibiotics, ceftriaxone and clindamycin. IV vancomycin was avoided due to AKI  Subjective:  "I use to be a risk taker when I was young", " I went to the sixth grade but stayed in the first grade two times"  Objective:   BP 130/80 (BP Location: Left Arm, Patient Position: Sitting, Cuff Size: Large)   Pulse 78   Temp 98.3 F (36.8 C) (Oral)   Resp 20   Ht 1.778 m (5\' 10" )   Wt 241 lb (109.3 kg)   SpO2 97%   BMI 34.58 kg/m   Review of Systems  Constitutional: Negative.  Negative for chills, diaphoresis, fever,  malaise/fatigue and weight loss.  HENT: Positive for hearing loss and tinnitus. Negative for congestion, ear discharge, ear pain, nosebleeds, sinus pain and sore throat.        Sometimes I heard ringing in my ears " I use to work at [employer]"  Eyes: Negative.  Negative for blurred vision, double vision, photophobia, pain, discharge and redness.       Eyes burn at times related to pollen  Respiratory: Negative.  Negative for cough, hemoptysis, sputum production, shortness of breath, wheezing and stridor.   Cardiovascular: Negative.  Negative for chest pain, palpitations, orthopnea, claudication, leg swelling and PND.  Gastrointestinal: Negative.  Negative for abdominal pain, blood in stool, constipation, diarrhea, heartburn, melena, nausea and vomiting.  Genitourinary: Negative.  Negative for dysuria, flank pain, frequency, hematuria and urgency.  Musculoskeletal: Negative.  Negative for back pain, falls, joint pain, myalgias and neck pain.  Skin: Negative.  Negative for itching and rash.  Neurological: Negative.  Negative for dizziness, tingling, tremors, sensory change, speech change, focal weakness, seizures, loss of consciousness, weakness and headaches.       Had dizziness before started taking antivert  Endo/Heme/Allergies: Negative for environmental allergies and polydipsia. Bruises/bleeds easily.       Bruises on arms   Psychiatric/Behavioral: Positive for depression. Negative for hallucinations, memory loss, substance abuse and suicidal ideas. The patient has insomnia. The patient is not nervous/anxious.        Reports at time he sits at home and cry  for no reason after "listening to a gospel"  "Sometimes I am afraid [place] to sleep. I'm afraid I won't wake up"    Physical Exam  Constitutional: He is oriented to person, place, and time. He appears well-developed and well-nourished.  HENT:  Head: Normocephalic and atraumatic.  Right Ear: External ear normal.  Left Ear: External  ear normal.  Eyes: Conjunctivae and EOM are normal. Pupils are equal, round, and reactive to light.  Neck: Normal range of motion. Neck supple.  Cardiovascular: Normal rate, regular rhythm, normal heart sounds and intact distal pulses.   Respiratory: Effort normal and breath sounds normal.  GI: Soft. Bowel sounds are normal.  Musculoskeletal: Normal range of motion.  Neurological: He is alert and oriented to person, place, and time.  Skin: Skin is warm and dry.  Psychiatric: He has a normal mood and affect. His behavior is normal. Judgment and thought content normal.    Encounter Medications:   Outpatient Encounter Prescriptions as of 01/05/2017  Medication Sig  . alprazolam (XANAX) 2 MG tablet Take 1 mg by mouth 3 (three) times daily.   Marland Kitchen amiodarone (PACERONE) 200 MG tablet Take 1 tablet (200 mg total) by mouth daily.  Marland Kitchen apixaban (ELIQUIS) 5 MG TABS tablet Take 1 tablet (5 mg total) by mouth 2 (two) times daily.  Marland Kitchen atorvastatin (LIPITOR) 20 MG tablet Take 1 tablet (20 mg total) by mouth daily at 6 PM.  . docusate sodium (COLACE) 100 MG capsule Take 100 mg by mouth 2 (two) times daily.  Marland Kitchen doxycycline (VIBRAMYCIN) 100 MG capsule Take 1 capsule (100 mg total) by mouth 2 (two) times daily.  . fenofibrate (TRICOR) 145 MG tablet TAKE ONE TABLET EACH DAY.  Marland Kitchen gabapentin (NEURONTIN) 300 MG capsule Take 300 mg by mouth 2 (two) times daily.   . INVOKANA 100 MG TABS tablet Take 1 tablet by mouth daily.  . meclizine (ANTIVERT) 25 MG tablet Take 25 mg by mouth 3 (three) times daily as needed for dizziness.  . metoprolol succinate (TOPROL-XL) 25 MG 24 hr tablet Take 1 tablet (25 mg total) by mouth daily.  . Omega-3 Fatty Acids (FISH OIL) 1200 MG CAPS Take by mouth.  . pantoprazole (PROTONIX) 40 MG tablet Take 1 tablet (40 mg total) by mouth 2 (two) times daily.  Marland Kitchen torsemide (DEMADEX) 10 MG tablet Take 1 tablet (10 mg total) by mouth daily.  Marland Kitchen acetaminophen (TYLENOL) 500 MG tablet Take 1,000 mg by mouth  2 (two) times daily as needed for headache. For pain/headaches  . ferrous sulfate 325 (65 FE) MG EC tablet Take 325 mg by mouth daily with breakfast.  . insulin glargine (LANTUS) 100 UNIT/ML injection Inject 0.6-0.75 mLs (60-75 Units total) into the skin 2 (two) times daily. 75 units in the morning then 60 units at bedtime  . insulin lispro (HUMALOG) 100 UNIT/ML injection Inject 0.35-0.5 mLs (35-50 Units total) into the skin 3 (three) times daily before meals. Patient states that he takes 35 units at breakfast, 50 units at dinner and 50 units at supper.  . levothyroxine (SYNTHROID, LEVOTHROID) 25 MCG tablet Take 25 mcg by mouth daily before breakfast.   No facility-administered encounter medications on file as of 01/05/2017.     Functional Status:   In your present state of health, do you have any difficulty performing the following activities: 12/12/2016 02/24/2016  Hearing? N N  Vision? N Y  Difficulty concentrating or making decisions? N Y  Walking or climbing stairs? N Y  Dressing  or bathing? N N  Doing errands, shopping? N N  Some recent data might be hidden    Fall/Depression Screening:    Fall Risk  12/28/2015 11/02/2015 09/30/2015  Falls in the past year? Yes Yes Yes  Number falls in past yr: 2 or more 1 1  Injury with Fall? No No No  Risk for fall due to : Impaired balance/gait - Impaired balance/gait  Risk for fall due to (comments): - - -  Follow up Falls prevention discussed - Falls prevention discussed   PHQ 2/9 Scores 11/25/2015 11/02/2015 09/30/2015 09/21/2015 08/25/2015 08/21/2015 07/30/2015  PHQ - 2 Score 0 2 2 2  0 0 0  PHQ- 9 Score - 7 7 7  - - -    Assessment:   [name] presents today at home sitting in his high back chair. He is very HOH with his tv again noted to be up very high. "I like my stories", referring to the tv show he is watching.  THN CM has had difficulty reaching [name] via his cell phone which does not have a mailbox set up to leave messages.  THN CM  called x 3 today without an answer and left a message for his niece, [name] without a return call. At [place] home visit appointment time [name] CM knocked on his door x 2 before he answered. When [facility] CM discussed this with him he stated he always cut his cell off when driving and today he did not recognize CM number and request CM write her number down in the [place] and on his refrigerator so he would be able to recognize the number in the future. THN CM wrote down the number but [name] also gave CM permission to stop by his home to check if he is home "whenever you need to".  THN CM offered to assist with setting up his voice mailbox but the offer was refused.  Assisted with placing his mobile number on the do not call list  [name] needs frequent redirection to stay on topic at hand. Talks about his past marriages, his experiences and past behaviors as a child and his family other than his parents "treated me bad" "I'm originally from [place]"    Acute Medical conditions He had a wound care appointment at 0900 today for the left foot wound/cellulitis discharging dx and states that his wound is less red. His foot has been wrapped and has netting intact.  He is aware that he is to "remain off my foot as much as I can" THN CM encouraged him to maintain the activity restrictions recommended by his doctor.  No swelling noted bilateral ankle but he has swelling in his right hand (weaken from 2003 stroke) Encouraged use of right arm sling to prevent flaccid hanging of right arm causing edema Refused offer of sling. States he sees pcp [name] every three months for check ups and medication refill   Medications Had been receiving pills in mail but did not like mail order for medications.  He is able to tell [name] CM accurately why his is taking each medication during medication review.  [name] was provided with a pill box. He has finished his 10 day course of doxycycline.  Chronic Medical conditions  (PVD, DM2, stroke, Afib, CAD,  Aortic valve replacement s/p 2010, PAF, on eliquis, AKI on CKD stage III and chronic combined systolic (congestive) and diastolic (congestive) heart failure) THN CM again discussed his ordered low sodium heart  healthy diet No recent falls- last fall in November 2015 (remembers because his sister that was good to him died 2017/07/19 with chf on ventilator) THN CM discussed orthopnea ans swelling of ankles as symptoms of CHF Discussed zones for CHF, monitoring weight, pulse and cbgs.  [name] noted to become anxious but calmed when [facility] CM stated she would assist him with how to monitor these values. Has a glucometer and did not mention wanting a new one today [name] slowly agreed to trying the [place] health care HF scale telemonitoring program but not sure if he would be able to commit to the program. Referral to [name] staff connected with the program   Social Concerns His nephew owns his trailer and "I pay trailer space rent" to nephew (reports they do not get along)  He does get along with his nieces especially sandra who visited with her son during the last [name] CM home visit Denied need for [name] referral at this time   Plan:   Continue to assess for TOC needs provide resources/referrals and see [name] in 1-2 weeks Routed notes to members of care team in [place] letter to pcp Successful contact letter to pt  [name] CM Care Plan Problem One     Most Recent Value  Care Plan Problem One  wound left foot   Role Documenting the Problem One  Care Management Coordinator  Care Plan for Problem One  Active  THN Long Term Goal   over the next 45 days patient will verbalize Knowledge of home care of left foot cellulitis wound  THN Long Term Goal Start Date  12/22/16  Interventions for Problem One Long Term Goal  assess left foot, teach home care monitor progress  THN CM Short Term Goal #1   Over the next 30 days left foot would will show signs and symptoms of  healing   THN CM Short Term Goal #1 Start Date  12/22/16  Interventions for Short Term Goal #1  assess left foot, teach home care monitor progress    [name] CM Care Plan Problem Two     Most Recent Value  Care Plan Problem Two  Knowledge deficit of home care for chronic medical conditions (CHF, DM, afib, HTN)  Role Documenting the Problem Two  Care Management Coordinator  Care Plan for Problem Two  Active  Interventions for Problem Two Long Term Goal   assess needs, educate on zones of care and home care for chronic medical conditions, evaluate understanding, CHF scale/weight program  [place] [name] Long Term Goal  over the next 45 days patient will voice better understanding of home care for chroinc medical conditions  THN Long Term Goal Start Date  12/22/16  [name] CM Short Term Goal #1   over the next 30 days patient will be able to verbalized zones of care, what to reprot to doctor and how to care for CHF, afib, htn, DM  THN CM Short Term Goal #1 Start Date  12/22/16  Interventions for Short Term Goal #2   assess needs, educate on zones of care and home care for chronic medical conditions, evaluate understanding, CHF scale/weight program       Floella Ensz L. Lavina Hamman, RN, BSN, [place] Care Management [phone]

## 2017-01-10 ENCOUNTER — Ambulatory Visit (HOSPITAL_COMMUNITY): Payer: Medicare Other | Admitting: Physical Therapy

## 2017-01-10 DIAGNOSIS — E08621 Diabetes mellitus due to underlying condition with foot ulcer: Secondary | ICD-10-CM | POA: Diagnosis not present

## 2017-01-10 DIAGNOSIS — M79672 Pain in left foot: Secondary | ICD-10-CM | POA: Diagnosis not present

## 2017-01-10 DIAGNOSIS — R600 Localized edema: Secondary | ICD-10-CM

## 2017-01-10 DIAGNOSIS — R262 Difficulty in walking, not elsewhere classified: Secondary | ICD-10-CM

## 2017-01-10 DIAGNOSIS — L97425 Non-pressure chronic ulcer of left heel and midfoot with muscle involvement without evidence of necrosis: Secondary | ICD-10-CM | POA: Diagnosis not present

## 2017-01-10 NOTE — Therapy (Signed)
[place], [name], [address] Phone: [phone]   Fax:  [phone]  Wound Care Therapy  Patient Details  Name: [name] MRN: [id number] Date of Birth: Jul 10, 1946 Referring Provider: Nicholes Mango  Encounter Date: 01/10/2017      PT End of Session - 01/10/17 0903    Visit Number 6   Number of Visits 12   Date for PT Re-Evaluation 01/20/17   Authorization Type UHC medicare   Authorization - Visit Number 6   Authorization - Number of Visits 12   PT Start Time 0815   PT Stop Time 4944   PT Time Calculation (min) 40 min   Activity Tolerance Patient tolerated treatment well   Behavior During Therapy [facility] for tasks assessed/performed      Past Medical History:  Diagnosis Date  . At risk for sudden cardiac death Jun 25, 2013  . Cataract   . Chronic anticoagulation 05/2013   started  . Chronic kidney disease    Patient reports he is seeing Kidney doctor in September  . Coronary artery disease   . Diabetes mellitus    type 2   . Dyslipidemia   . GERD (gastroesophageal reflux disease)   . History of valve replacement 2010   [facility] Ease bioprosthetic 55mm  . Hypertension   . PAF (paroxysmal atrial fibrillation) ([place]) 05/2013   converted with amiodarone to SR  . S/P CABG x 3 2010  . S/P cardiac catheterization, Rt & Lt heart cath 06/05/2013 25-Jun-2013  . Stroke [facility]) 2003   R-sided weakness & upper extremity swelling   . Thyroid disease     Past Surgical History:  Procedure Laterality Date  . AORTIC VALVE REPLACEMENT  01/26/2009   [facility] Ease bioprosthetic 22mm valve  . CARDIAC CATHETERIZATION  06/05/2013   native 3 vessel disease; patent LIMA to LAD, SVG to OM and SVG to PDA; Elevated right and left heart filling pressures, with predominantly pulmonary venous hypertension, normal PVR  . COLONOSCOPY  2007   Dr. Aviva Signs: internal hemorrhoids  . COLONOSCOPY N/A 06/16/2016    Procedure: COLONOSCOPY;  Surgeon: Rogene Houston, MD;  [facility];  Service: Endoscopy;  Laterality: N/A;  1:45  . CORONARY ARTERY BYPASS GRAFT  01/26/2009   LIMA to LAD, SVG to circumflex, SVG to PDA  . GRAFT(S) ANGIOGRAM  06/05/2013   Procedure: GRAFT(S) Cyril Loosen;  Surgeon: Leonie Man, MD;  Location: [facility] CATH LAB;  Service: Cardiovascular;;  . IR GENERIC HISTORICAL  05/12/2016   IR RADIOLOGIST EVAL & MGMT 05/12/2016 Jacqulynn Cadet, MD [facility] INTERV RAD  . LEFT AND RIGHT HEART CATHETERIZATION WITH CORONARY ANGIOGRAM N/A 06/05/2013   Procedure: LEFT AND RIGHT HEART CATHETERIZATION WITH CORONARY ANGIOGRAM;  Surgeon: Leonie Man, MD;  Location: [facility] CATH LAB;  Service: Cardiovascular;  Laterality: N/A;  . TRANSTHORACIC ECHOCARDIOGRAM  09/2011   grade 1 diastolic dysfunction; increasing valve gradient; calcified MV annulus   . TRANSTHORACIC ECHOCARDIOGRAM  06/03/2013   EF 25-30%, mod conc. hypertrophy, grade 3 diastolic dysfunction; LA mod dilated; calcified MV annulus; transaortic gradients are normal for the bioprosthetic valve; inf vena cava dilated (elevated CVP) - LifeVest    There were no vitals filed for this visit.                  Wound Therapy - 01/10/17 0859    Subjective Pt states that he had a significant amount of pain on the top of  his foot last night.  He is no longer having the pain today.   Patient and Family Stated Goals wound to heal    Date of Onset 09/15/16   Prior Treatments self care, antibiotics, IP hospitalization    Pain Assessment No/denies pain   Evaluation and Treatment Procedures Explained to Patient/Family Yes   Evaluation and Treatment Procedures agreed to   Wound Properties Date First Assessed: 12/12/16 Time First Assessed: 6283 Wound Type: Diabetic ulcer Location: Foot Location Orientation: Left;Upper Present on Admission: Yes   Dressing Type Gauze (Comment)   Dressing Changed Changed   Dressing Status Clean;Dry;Intact    Dressing Change Frequency PRN   Site / Wound Assessment Dry   % Wound base Red or Granulating 100%   % Wound base Yellow/Fibrinous Exudate 0%   Peri-wound Assessment Edema;Erythema (blanchable)   Margins Unattached edges (unapproximated)   Drainage Amount None   Selective Debridement - Location callous, dry skin    Selective Debridement - Tools Used Forceps;Scissors   Selective Debridement - Tissue Removed callous   Wound Therapy - Clinical Statement Pt does not have any redness on the bottom of his foot.  Wound had closed with callous not with proper healing.  Area below callous remains mushy.  Callous area was debrided leaving a small pinhole area.  Noted swelling and redness along lateral aspect of great toe going into forefoot.  Pt was urged to obtain an antibiotic.     Factors Delaying/Impairing Wound Healing Altered sensation;Diabetes Mellitus;Infection - systemic/local;Immobility;Multiple medical problems;Polypharmacy;Vascular compromise;Other (comment)   Hydrotherapy Plan Debridement;Dressing change;Patient/family education;Pulsatile lavage with suction   Wound Therapy - Frequency Other (comment)  2x/week for 6 weeks    Wound Therapy - Current Recommendations PT   Wound Plan cleansing, debridement and dressing change.  May use pulse lavage if indicated.   Measure next session.   Dressing  medihoney 2x2,area then taped with medipore tape as therapist is not sure if bandaging is irritating patients foot.                     PT Short Term Goals - 12/27/16 0853      PT SHORT TERM GOAL #1   Title Pt wound depth to be no greater than .6   Time 3   Period Weeks   Status On-going     PT SHORT TERM GOAL #2   Title Callous around Lt plantar wound to be gone to decrease pressure placed on wound when walking    Time 3   Period Weeks   Status On-going     PT SHORT TERM GOAL #3   Title Redness and edema along LT 1st MTP to be gone to demonstrate the absence of infection.     Time 3   Period Weeks   Status On-going     PT SHORT TERM GOAL #4   Title Pt to verbalize the importance of keeping his sugar levels around 100 to allow healing of wound.   Time 3   Period Weeks   Status On-going           PT Long Term Goals - 12/27/16 0853      PT LONG TERM GOAL #1   Title Wound depth to be no greater than .2 to allow pt to feel comfortable with self treatment.    Time 6   Period Weeks   Status On-going     PT LONG TERM GOAL #2   Title Wound on Lt plantar aspect  to have no undermining to allow pt to feel cmforable with self treatment.    Time 6   Period Weeks   Status On-going               Plan - 01/10/17 0904    Clinical Impression Statement see above    Rehab Potential Good   PT Frequency 2x / week   PT Duration 6 weeks   PT Treatment/Interventions ADLs/Self Care Home Management;Other (comment)  debridement with dressing changes    PT Next Visit Plan see if pt has obtained antibiotic; assess foot for redness and swelling.    Consulted and Agree with Plan of Care Patient      Patient will benefit from skilled therapeutic intervention in order to improve the following deficits and impairments:  Abnormal gait, Increased edema, Pain, Other (comment) (nonhealing wound )  Visit Diagnosis: Diabetic ulcer of left midfoot associated with diabetes mellitus due to underlying condition, with muscle involvement without evidence of necrosis (HCC)  Difficulty in walking, not elsewhere classified  Localized edema  Pain in left foot     Problem List Patient Active Problem List   Diagnosis Date Noted  . Cellulitis of left foot 12/12/2016  . Nausea & vomiting 02/24/2016  . A-fib ([place]) 02/23/2016  . Hypothyroidism 02/23/2016  . CKD (chronic kidney disease), stage III 02/23/2016  . Anxiety 02/23/2016  . Chronic combined systolic (congestive) and diastolic (congestive) heart failure ([place]) 02/23/2016  . Dizziness 02/23/2016  . Open wound of  right foot 02/23/2016  . Rectal bleeding 07/28/2015  . Chronic anticoagulation 07/28/2015  . PVD (peripheral vascular disease) ([place])   . S/P angioplasty 04/23/2015  . Peripheral arterial occlusive disease ([place]) 04/23/2015  . PAD (peripheral artery disease) ([place])   . Critical lower limb ischemia   . Toe ulcer, right ([place])   . Type II or unspecified type diabetes mellitus without mention of complication, uncontrolled 11/27/2013  . Acute respiratory failure with hypoxia ([place]) 11/26/2013  . CAP (community acquired pneumonia) 11/26/2013  . CHF exacerbation ([place]) 11/25/2013  . Cardiomyopathy, ischemic 06/13/2013  . At risk for sudden cardiac death Jun 20, 2013  . S/P cardiac catheterization, Rt & Lt heart cath 06/05/2013 06/20/13  . Acute on chronic combined systolic and diastolic HF (heart failure), NYHA class 3 ([place]) 06/04/2013  . Atrial flutter with rapid ventricular response ([place]) 06/02/2013  . Acute renal failure: Cr up to 1.8 06/02/2013  . Hyperkalemia 06/02/2013    Class: Acute  . LBBB (left bundle branch block) 06/01/2013  . Hypotension- secondary to AF and Diltiazem Rx 06/01/2013  . Obesity (BMI 30-39.9) 06/01/2013  . Acute on chronic diastolic heart failure - due to Afib AVR 06/01/2013  . Atrial fibrillation with RVR- converted with Amiodarone 05/31/2013  . Stroke- Lt brain 2003 04/08/2013  . Hemiplegia affecting right dominant side ([place]) 04/08/2013  . Cataract 04/08/2013  . GERD (gastroesophageal reflux disease) 04/08/2013  . S/P CABG x 3 - 2010 04/08/2013  . S/P AVR-tissue, 2010 04/08/2013  . DM2 (diabetes mellitus, type 2) ([place]) 04/08/2013  . Dyslipidemia 04/08/2013  . SUBACROMIAL BURSITIS, LEFT 11/05/2009    Rayetta Humphrey, PT CLT [phone] 01/10/2017, 9:04 AM  [place] [address] [place], [name], [address] Phone: [phone]   Fax:  [phone]  Name: [name] MRN: [id number] Date of Birth:  1946-05-16

## 2017-01-12 ENCOUNTER — Ambulatory Visit (HOSPITAL_COMMUNITY): Payer: Medicare Other | Admitting: Physical Therapy

## 2017-01-12 DIAGNOSIS — E08621 Diabetes mellitus due to underlying condition with foot ulcer: Secondary | ICD-10-CM

## 2017-01-12 DIAGNOSIS — L97425 Non-pressure chronic ulcer of left heel and midfoot with muscle involvement without evidence of necrosis: Secondary | ICD-10-CM | POA: Diagnosis not present

## 2017-01-12 DIAGNOSIS — M79672 Pain in left foot: Secondary | ICD-10-CM

## 2017-01-12 DIAGNOSIS — R262 Difficulty in walking, not elsewhere classified: Secondary | ICD-10-CM | POA: Diagnosis not present

## 2017-01-12 DIAGNOSIS — R600 Localized edema: Secondary | ICD-10-CM | POA: Diagnosis not present

## 2017-01-12 NOTE — Therapy (Signed)
[place], [name], [address] Phone: [phone]   Fax:  [phone]  Wound Care Therapy  Patient Details  Name: [name] MRN: [id number] Date of Birth: May 06, 1946 Referring Provider: Nicholes Mango  Encounter Date: 01/12/2017      PT End of Session - 01/12/17 1103    Visit Number 7   Number of Visits 12   Date for PT Re-Evaluation 01/20/17   Authorization Type UHC medicare   Authorization - Visit Number 7   Authorization - Number of Visits 12   PT Start Time 0815   PT Stop Time 0840   PT Time Calculation (min) 25 min   Activity Tolerance Patient tolerated treatment well   Behavior During Therapy [facility] for tasks assessed/performed      Past Medical History:  Diagnosis Date  . At risk for sudden cardiac death June 23, 2013  . Cataract   . Chronic anticoagulation 05/2013   started  . Chronic kidney disease    Patient reports he is seeing Kidney doctor in September  . Coronary artery disease   . Diabetes mellitus    type 2   . Dyslipidemia   . GERD (gastroesophageal reflux disease)   . History of valve replacement 2010   [facility] Ease bioprosthetic 80mm  . Hypertension   . PAF (paroxysmal atrial fibrillation) ([place]) 05/2013   converted with amiodarone to SR  . S/P CABG x 3 2010  . S/P cardiac catheterization, Rt & Lt heart cath 06/05/2013 06-23-2013  . Stroke [facility]) 2003   R-sided weakness & upper extremity swelling   . Thyroid disease     Past Surgical History:  Procedure Laterality Date  . AORTIC VALVE REPLACEMENT  01/26/2009   [facility] Ease bioprosthetic 3mm valve  . CARDIAC CATHETERIZATION  06/05/2013   native 3 vessel disease; patent LIMA to LAD, SVG to OM and SVG to PDA; Elevated right and left heart filling pressures, with predominantly pulmonary venous hypertension, normal PVR  . COLONOSCOPY  2007   Dr. Aviva Signs: internal hemorrhoids  . COLONOSCOPY N/A 06/16/2016   Procedure: COLONOSCOPY;  Surgeon: Rogene Houston, MD;  [facility];  Service: Endoscopy;  Laterality: N/A;  1:45  . CORONARY ARTERY BYPASS GRAFT  01/26/2009   LIMA to LAD, SVG to circumflex, SVG to PDA  . GRAFT(S) ANGIOGRAM  06/05/2013   Procedure: GRAFT(S) Cyril Loosen;  Surgeon: Leonie Man, MD;  Location: [facility] CATH LAB;  Service: Cardiovascular;;  . IR GENERIC HISTORICAL  05/12/2016   IR RADIOLOGIST EVAL & MGMT 05/12/2016 Jacqulynn Cadet, MD [facility] INTERV RAD  . LEFT AND RIGHT HEART CATHETERIZATION WITH CORONARY ANGIOGRAM N/A 06/05/2013   Procedure: LEFT AND RIGHT HEART CATHETERIZATION WITH CORONARY ANGIOGRAM;  Surgeon: Leonie Man, MD;  Location: [facility] CATH LAB;  Service: Cardiovascular;  Laterality: N/A;  . TRANSTHORACIC ECHOCARDIOGRAM  09/2011   grade 1 diastolic dysfunction; increasing valve gradient; calcified MV annulus   . TRANSTHORACIC ECHOCARDIOGRAM  06/03/2013   EF 25-30%, mod conc. hypertrophy, grade 3 diastolic dysfunction; LA mod dilated; calcified MV annulus; transaortic gradients are normal for the bioprosthetic valve; inf vena cava dilated (elevated CVP) - LifeVest    There were no vitals filed for this visit.                  Wound Therapy - 01/12/17 1100    Subjective Pt states he is still having pain on the dorsal aspect of his foot.  There is also still redness present.   Patient and Family Stated Goals wound to heal    Date of Onset 09/15/16   Prior Treatments self care, antibiotics, IP hospitalization    Pain Assessment 0-10   Pain Score 2    Pain Type Acute pain   Pain Location Foot   Pain Orientation Left;Other (Comment)  dorsal   Pain Descriptors / Indicators Shooting;Sore   Pain Onset Gradual   Patients Stated Pain Goal 0   Pain Intervention(s) Elevated extremity   Multiple Pain Sites No   Evaluation and Treatment Procedures Explained to Patient/Family Yes   Evaluation and Treatment Procedures agreed to   Wound Properties Date  First Assessed: 12/12/16 Time First Assessed: 5732 Wound Type: Diabetic ulcer Location: Foot Location Orientation: Left;Upper Present on Admission: Yes   Dressing Type Gauze (Comment)   Dressing Changed Changed   Dressing Status Clean;Dry;Intact   Dressing Change Frequency PRN   Site / Wound Assessment Dry   % Wound base Red or Granulating 100%   % Wound base Yellow/Fibrinous Exudate 0%   Peri-wound Assessment Edema;Erythema (blanchable)   Margins Unattached edges (unapproximated)   Drainage Amount None   Treatment Cleansed;Debridement (Selective)   Selective Debridement - Location callous, dry skin    Selective Debridement - Tools Used Forceps;Scissors   Selective Debridement - Tissue Removed callous   Wound Therapy - Clinical Statement Medial aspect and dorsum of foot continues to be red with some induration and edema present.  Pt began antibiotic yesterday so should show some improvement by next session.  Reminded of signs /symptoms to go to ED if worsens.  Pt verbalizes understanding. Minimal drainage and opening.  Cleansed and redressed with honey alginate.    Factors Delaying/Impairing Wound Healing Altered sensation;Diabetes Mellitus;Infection - systemic/local;Immobility;Multiple medical problems;Polypharmacy;Vascular compromise;Other (comment)   Hydrotherapy Plan Debridement;Dressing change;Patient/family education;Pulsatile lavage with suction   Wound Therapy - Frequency Other (comment)  2x/week for 6 weeks    Wound Therapy - Current Recommendations PT   Wound Plan cleansing, debridement and dressing change.  May use pulse lavage if indicated.    Dressing  medihoney 2x2,area then taped with medipore tape as therapist is not sure if bandaging is irritating patients foot.                     PT Short Term Goals - 12/27/16 0853      PT SHORT TERM GOAL #1   Title Pt wound depth to be no greater than .6   Time 3   Period Weeks   Status On-going     PT SHORT TERM GOAL  #2   Title Callous around Lt plantar wound to be gone to decrease pressure placed on wound when walking    Time 3   Period Weeks   Status On-going     PT SHORT TERM GOAL #3   Title Redness and edema along LT 1st MTP to be gone to demonstrate the absence of infection.    Time 3   Period Weeks   Status On-going     PT SHORT TERM GOAL #4   Title Pt to verbalize the importance of keeping his sugar levels around 100 to allow healing of wound.   Time 3   Period Weeks   Status On-going           PT Long Term Goals - 12/27/16 2025      PT LONG TERM GOAL #1   Title Wound depth to be  no greater than .2 to allow pt to feel comfortable with self treatment.    Time 6   Period Weeks   Status On-going     PT LONG TERM GOAL #2   Title Wound on Lt plantar aspect to have no undermining to allow pt to feel cmforable with self treatment.    Time 6   Period Weeks   Status On-going             Patient will benefit from skilled therapeutic intervention in order to improve the following deficits and impairments:     Visit Diagnosis: Diabetic ulcer of left midfoot associated with diabetes mellitus due to underlying condition, with muscle involvement without evidence of necrosis ([place])  Difficulty in walking, not elsewhere classified  Localized edema  Pain in left foot     Problem List Patient Active Problem List   Diagnosis Date Noted  . Cellulitis of left foot 12/12/2016  . Nausea & vomiting 02/24/2016  . A-fib ([place]) 02/23/2016  . Hypothyroidism 02/23/2016  . CKD (chronic kidney disease), stage III 02/23/2016  . Anxiety 02/23/2016  . Chronic combined systolic (congestive) and diastolic (congestive) heart failure ([place]) 02/23/2016  . Dizziness 02/23/2016  . Open wound of right foot 02/23/2016  . Rectal bleeding 07/28/2015  . Chronic anticoagulation 07/28/2015  . PVD (peripheral vascular disease) ([place])   . S/P angioplasty 04/23/2015  . Peripheral arterial occlusive  disease ([place]) 04/23/2015  . PAD (peripheral artery disease) ([place])   . Critical lower limb ischemia   . Toe ulcer, right ([place])   . Type II or unspecified type diabetes mellitus without mention of complication, uncontrolled 11/27/2013  . Acute respiratory failure with hypoxia ([place]) 11/26/2013  . CAP (community acquired pneumonia) 11/26/2013  . CHF exacerbation ([place]) 11/25/2013  . Cardiomyopathy, ischemic 06/13/2013  . At risk for sudden cardiac death 06/13/2013  . S/P cardiac catheterization, Rt & Lt heart cath 06/05/2013 06/13/2013  . Acute on chronic combined systolic and diastolic HF (heart failure), NYHA class 3 ([place]) 06/04/2013  . Atrial flutter with rapid ventricular response ([place]) 06/02/2013  . Acute renal failure: Cr up to 1.8 06/02/2013  . Hyperkalemia 06/02/2013    Class: Acute  . LBBB (left bundle branch block) 06/01/2013  . Hypotension- secondary to AF and Diltiazem Rx 06/01/2013  . Obesity (BMI 30-39.9) 06/01/2013  . Acute on chronic diastolic heart failure - due to Afib AVR 06/01/2013  . Atrial fibrillation with RVR- converted with Amiodarone 05/31/2013  . Stroke- Lt brain 2003 04/08/2013  . Hemiplegia affecting right dominant side ([place]) 04/08/2013  . Cataract 04/08/2013  . GERD (gastroesophageal reflux disease) 04/08/2013  . S/P CABG x 3 - 2010 04/08/2013  . S/P AVR-tissue, 2010 04/08/2013  . DM2 (diabetes mellitus, type 2) ([place]) 04/08/2013  . Dyslipidemia 04/08/2013  . SUBACROMIAL BURSITIS, LEFT 11/05/2009    Teena Irani, PTA/CLT [phone]  01/12/2017, 11:05 AM  [place], [name], [address] Phone: [phone]   Fax:  [phone]  Name: JEYSON DESHOTEL MRN: [id number] Date of Birth: [birth year]

## 2017-01-13 ENCOUNTER — Other Ambulatory Visit: Payer: Self-pay | Admitting: *Deleted

## 2017-01-13 NOTE — Patient Outreach (Signed)
[place] [facility]) Care Management  01/13/2017  BLAIN HUNSUCKER 14-Mar-1946 [id number]   Care coordination   Sent a referral to [facility] for [facility] CHF tele monitoring program today  Plans Follow up with Mr Far[place]qui in 1-2 weeks   Joelene Millin L. Lavina Hamman, RN, BSN, [place] Care Management [phone]

## 2017-01-17 ENCOUNTER — Ambulatory Visit (HOSPITAL_COMMUNITY): Payer: Medicare Other | Attending: Internal Medicine | Admitting: Physical Therapy

## 2017-01-17 ENCOUNTER — Other Ambulatory Visit (HOSPITAL_COMMUNITY)
Admission: RE | Admit: 2017-01-17 | Discharge: 2017-01-17 | Disposition: A | Discharge status: Home / Self Care | Payer: Medicare Other | Source: Other Acute Inpatient Hospital | Attending: Internal Medicine | Admitting: Internal Medicine

## 2017-01-17 DIAGNOSIS — X58XXXA Exposure to other specified factors, initial encounter: Secondary | ICD-10-CM | POA: Diagnosis not present

## 2017-01-17 DIAGNOSIS — R262 Difficulty in walking, not elsewhere classified: Secondary | ICD-10-CM | POA: Insufficient documentation

## 2017-01-17 DIAGNOSIS — S91302A Unspecified open wound, left foot, initial encounter: Secondary | ICD-10-CM | POA: Insufficient documentation

## 2017-01-17 DIAGNOSIS — M79672 Pain in left foot: Secondary | ICD-10-CM

## 2017-01-17 DIAGNOSIS — R600 Localized edema: Secondary | ICD-10-CM | POA: Insufficient documentation

## 2017-01-17 DIAGNOSIS — E08621 Diabetes mellitus due to underlying condition with foot ulcer: Secondary | ICD-10-CM | POA: Insufficient documentation

## 2017-01-17 DIAGNOSIS — L97425 Non-pressure chronic ulcer of left heel and midfoot with muscle involvement without evidence of necrosis: Secondary | ICD-10-CM | POA: Insufficient documentation

## 2017-01-17 NOTE — Therapy (Signed)
[place], [name], [address] Phone: [phone]   Fax:  [phone]  Wound Care Therapy  Patient Details  Name: [name] MRN: [id number] Date of Birth: Dec 25, 1945 Referring Provider: Nicholes Mango  Encounter Date: 01/17/2017      PT End of Session - 01/17/17 0918    Visit Number 8   Number of Visits 12   Date for PT Re-Evaluation 01/20/17   Authorization Type [employer]HC medicare   Authorization - Visit Number 8   Authorization - Number of Visits 12   PT Start Time 0830   PT Stop Time 0900   PT Time Calculation (min) 30 min   Activity Tolerance Patient tolerated treatment well   Behavior During Therapy [facility] for tasks assessed/performed      Past Medical History:  Diagnosis Date  . At risk for sudden cardiac death 06/09/2013  . Cataract   . Chronic anticoagulation 05/2013   started  . Chronic kidney disease    Patient reports he is seeing Kidney doctor in September  . Coronary artery disease   . Diabetes mellitus    type 2   . Dyslipidemia   . GERD (gastroesophageal reflux disease)   . History of valve replacement 2010   [facility] Ease bioprosthetic 46mm  . Hypertension   . PAF (paroxysmal atrial fibrillation) ([place]) 05/2013   converted with amiodarone to SR  . S/P CABG x 3 2010  . S/P cardiac catheterization, Rt & Lt heart cath 06/05/2013 Jun 09, 2013  . Stroke [facility]) 2003   R-sided weakness & upper extremity swelling   . Thyroid disease     Past Surgical History:  Procedure Laterality Date  . AORTIC VALVE REPLACEMENT  01/26/2009   [facility] Ease bioprosthetic 63mm valve  . CARDIAC CATHETERIZATION  06/05/2013   native 3 vessel disease; patent LIMA to LAD, SVG to OM and SVG to PDA; Elevated right and left heart filling pressures, with predominantly pulmonary venous hypertension, normal PVR  . COLONOSCOPY  2007   Dr. Aviva Signs: internal hemorrhoids  . COLONOSCOPY N/A 06/16/2016    Procedure: COLONOSCOPY;  Surgeon: Rogene Houston, MD;  Location: [facility] ENDO SUITE;  Service: Endoscopy;  Laterality: N/A;  1:45  . CORONARY ARTERY BYPASS GRAFT  01/26/2009   LIMA to LAD, SVG to circumflex, SVG to PDA  . GRAFT(S) ANGIOGRAM  06/05/2013   Procedure: GRAFT(S) Cyril Loosen;  Surgeon: Leonie Man, MD;  Location: [facility] CATH LAB;  Service: Cardiovascular;;  . IR GENERIC HISTORICAL  05/12/2016   IR RADIOLOGIST EVAL & MGMT 05/12/2016 Jacqulynn Cadet, MD [facility] INTERV RAD  . LEFT AND RIGHT HEART CATHETERIZATION WITH CORONARY ANGIOGRAM N/A 06/05/2013   Procedure: LEFT AND RIGHT HEART CATHETERIZATION WITH CORONARY ANGIOGRAM;  Surgeon: Leonie Man, MD;  Location: [facility] CATH LAB;  Service: Cardiovascular;  Laterality: N/A;  . TRANSTHORACIC ECHOCARDIOGRAM  09/2011   grade 1 diastolic dysfunction; increasing valve gradient; calcified MV annulus   . TRANSTHORACIC ECHOCARDIOGRAM  06/03/2013   EF 25-30%, mod conc. hypertrophy, grade 3 diastolic dysfunction; LA mod dilated; calcified MV annulus; transaortic gradients are normal for the bioprosthetic valve; inf vena cava dilated (elevated CVP) - LifeVest    There were no vitals filed for this visit.                  Wound Therapy - 01/17/17 0911    Subjective Pt states that his foot is still swollen and painful.  Patient and Family Stated Goals wound to heal    Date of Onset 09/15/16   Prior Treatments self care, antibiotics, IP hospitalization    Pain Assessment 0-10   Pain Score 6   without pain medication.   Pain Type Acute pain   Pain Location Foot   Pain Orientation Left;Medial   Pain Descriptors / Indicators Sore   Pain Onset With Activity   Pain Intervention(s) Medication (See eMAR)   Multiple Pain Sites No   Evaluation and Treatment Procedures Explained to Patient/Family Yes   Evaluation and Treatment Procedures agreed to   Wound Properties Date First Assessed: 12/12/16 Time First Assessed: 1191 Wound Type: Diabetic  ulcer Location: Foot Location Orientation: Left;Upper Present on Admission: Yes Final Assessment Date: 01/17/17 Final Assessment Time: 0914   Dressing Type Gauze (Comment)   Dressing Changed Changed   Dressing Status Clean;Dry;Intact   Dressing Change Frequency PRN   Site / Wound Assessment Dry   % Wound base Red or Granulating 100%   % Wound base Yellow/Fibrinous Exudate 0%   Peri-wound Assessment Edema;Erythema (blanchable)   Margins Unattached edges (unapproximated)   Drainage Amount None   Wound Properties Date First Assessed: 01/17/17 Time First Assessed: 0814 Wound Type: Non-pressure wound   Dressing Type None   Dressing Change Frequency PRN   Site / Wound Assessment Painful;Red;Other (Comment)  swollen   % Wound base Red or Granulating 90%   % Wound base Yellow/Fibrinous Exudate 10%   Peri-wound Assessment Edema;Erythema (blanchable)  red halo around wound 4.5 cm in diameter.    Wound Length (cm) 2 cm   Wound Width (cm) 1.5 cm   Drainage Amount Scant   Drainage Description Serous   Treatment Cleansed;Debridement (Selective)   Selective Debridement - Location callous  callous from plantar wound which is healed; dead skin medial   Selective Debridement - Tools Used Forceps;Scissors   Selective Debridement - Tissue Removed callous   Wound Therapy - Clinical Statement Pt plantar aspect wound has healed but therapist did debride callous area.  Pt shoes appear to be rubbing on the medial aspect of his first MTP causing a wound, significant swelling , pain and redness.   Order was faxed to allow therapist to work on this wound.  Therapist also sent a culture request over and cultured wound. Therapist urged pt to wear diabetic shoes.  Also recommended going to MD or ER about foot.  Pt states if it becomes worse he will but right now the pain medication is enough to deal with the pain.  Therapist  drew area around the wound if red area goes beyond drawing encourage pt to go to ER or MD>.    Factors Delaying/Impairing Wound Healing Altered sensation;Diabetes Mellitus;Infection - systemic/local;Immobility;Multiple medical problems;Polypharmacy;Vascular compromise;Other (comment)   Hydrotherapy Plan Debridement;Dressing change;Patient/family education;Pulsatile lavage with suction   Wound Therapy - Frequency Other (comment)  2x/week for 6 weeks    Wound Therapy - Current Recommendations PT   Wound Plan Assess medial aspect of first metatarsal.     Dressing  medihoney ab pad,  taped with medipore tape                    PT Short Term Goals - 01/17/17 0928      PT SHORT TERM GOAL #1   Title Pt wound depth to be no greater than .6   Time 3   Period Weeks   Status Achieved     PT SHORT TERM GOAL #2  Title Callous around Lt plantar wound to be gone to decrease pressure placed on wound when walking    Time 3   Period Weeks   Status Achieved     PT SHORT TERM GOAL #3   Title Redness and edema along LT 1st MTP to be gone to demonstrate the absence of infection.    Time 3   Period Weeks   Status On-going     PT SHORT TERM GOAL #4   Title Pt to verbalize the importance of keeping his sugar levels around 100 to allow healing of wound.   Time 3   Period Weeks   Status Achieved           PT Long Term Goals - 01/17/17 6387      PT LONG TERM GOAL #1   Title Wound depth to be no greater than .2 to allow pt to feel comfortable with self treatment.    Time 6   Period Weeks   Status Achieved     PT LONG TERM GOAL #2   Title Wound on Lt plantar aspect to have no undermining to allow pt to feel cmforable with self treatment.    Time 6   Period Weeks   Status Achieved     PT LONG TERM GOAL #3   Title wound on medial aspect of Lt 1st MTP to be healed   01/17/2017   Time 4   Period Weeks   Status New     PT LONG TERM GOAL #4   Title Pt to be wearing diabetic shoes as to not rub any more wounds on pt feet.   01/17/2017   Time 2   Period Days     PT  LONG TERM GOAL #5   Title PT pain to be no greater than a 2/10 in his left foot   01/17/2017   Time 2   Period Weeks   Status New             Patient will benefit from skilled therapeutic intervention in order to improve the following deficits and impairments:     Visit Diagnosis: Diabetic ulcer of left midfoot associated with diabetes mellitus due to underlying condition, with muscle involvement without evidence of necrosis ([place])  Difficulty in walking, not elsewhere classified  Localized edema  Pain in left foot     Problem List Patient Active Problem List   Diagnosis Date Noted  . Cellulitis of left foot 12/12/2016  . Nausea & vomiting 02/24/2016  . A-fib ([place]) 02/23/2016  . Hypothyroidism 02/23/2016  . CKD (chronic kidney disease), stage III 02/23/2016  . Anxiety 02/23/2016  . Chronic combined systolic (congestive) and diastolic (congestive) heart failure ([place]) 02/23/2016  . Dizziness 02/23/2016  . Open wound of right foot 02/23/2016  . Rectal bleeding 07/28/2015  . Chronic anticoagulation 07/28/2015  . PVD (peripheral vascular disease) ([place])   . S/P angioplasty 04/23/2015  . Peripheral arterial occlusive disease ([place]) 04/23/2015  . PAD (peripheral artery disease) ([place])   . Critical lower limb ischemia   . Toe ulcer, right ([place])   . Type II or unspecified type diabetes mellitus without mention of complication, uncontrolled 11/27/2013  . Acute respiratory failure with hypoxia ([place]) 11/26/2013  . CAP (community acquired pneumonia) 11/26/2013  . CHF exacerbation ([place]) 11/25/2013  . Cardiomyopathy, ischemic 06/13/2013  . At risk for sudden cardiac death Jun 26, 2013  . S/P cardiac catheterization, Rt & Lt heart cath 06/05/2013 2013/06/26  . Acute on chronic combined  systolic and diastolic HF (heart failure), NYHA class 3 ([place]) 06/04/2013  . Atrial flutter with rapid ventricular response ([place]) 06/02/2013  . Acute renal failure: Cr up to 1.8 06/02/2013  .  Hyperkalemia 06/02/2013    Class: Acute  . LBBB (left bundle branch block) 06/01/2013  . Hypotension- secondary to AF and Diltiazem Rx 06/01/2013  . Obesity (BMI 30-39.9) 06/01/2013  . Acute on chronic diastolic heart failure - due to Afib AVR 06/01/2013  . Atrial fibrillation with RVR- converted with Amiodarone 05/31/2013  . Stroke- Lt brain 2003 04/08/2013  . Hemiplegia affecting right dominant side ([place]) 04/08/2013  . Cataract 04/08/2013  . GERD (gastroesophageal reflux disease) 04/08/2013  . S/P CABG x 3 - 2010 04/08/2013  . S/P AVR-tissue, 2010 04/08/2013  . DM2 (diabetes mellitus, type 2) ([place]) 04/08/2013  . Dyslipidemia 04/08/2013  . SUBACROMIAL BURSITIS, LEFT 11/05/2009    Rayetta Humphrey, PT CLT [phone] 01/17/2017, 9:31 AM  [place] [address] [place], [name], [address] Phone: [phone]   Fax:  [phone]  Name: [name] MRN: [id number] Date of Birth: 01/07/1946

## 2017-01-19 ENCOUNTER — Other Ambulatory Visit: Payer: Self-pay | Admitting: *Deleted

## 2017-01-19 ENCOUNTER — Ambulatory Visit (HOSPITAL_COMMUNITY): Payer: Medicare Other | Admitting: Physical Therapy

## 2017-01-19 DIAGNOSIS — L97425 Non-pressure chronic ulcer of left heel and midfoot with muscle involvement without evidence of necrosis: Principal | ICD-10-CM

## 2017-01-19 DIAGNOSIS — R262 Difficulty in walking, not elsewhere classified: Secondary | ICD-10-CM

## 2017-01-19 DIAGNOSIS — R600 Localized edema: Secondary | ICD-10-CM

## 2017-01-19 DIAGNOSIS — E08621 Diabetes mellitus due to underlying condition with foot ulcer: Secondary | ICD-10-CM

## 2017-01-19 DIAGNOSIS — M79672 Pain in left foot: Secondary | ICD-10-CM

## 2017-01-19 LAB — AEROBIC CULTURE  (SUPERFICIAL SPECIMEN): CULTURE: NO GROWTH

## 2017-01-19 LAB — AEROBIC CULTURE W GRAM STAIN (SUPERFICIAL SPECIMEN)

## 2017-01-19 NOTE — Patient Outreach (Signed)
[facility]) Care Management  01/19/2017  [name] [birth year] [id number]   Care coordination  THN CM and [facility] telemonitoring RN, Othelia Pulling made contact to review to see if [name] is appropriate for CHF wt telemonitoring [name] has been noted to have issues answering mobile calls from [employer] CM, poor technology skills and knowledge level.  These barriers were discussed with Anderson Malta after she reviewed the telemonitoring 5 day a week process using a tablet.  [employer] RN attempt to make contact with [name] was not successful.  THN CM is scheduled to see him again to day for a home visit and will review the need to use scale 5 times a day with a tablet to see if pt is compatible with this program   Elberta Lachapelle L. Lavina Hamman, RN, BSN, [place] Care Management [phone]

## 2017-01-19 NOTE — Patient Outreach (Signed)
[facility]) Care Management   01/19/2017  [name] 1946-01-08 [id number]  [name] is an 71 y.o. 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 [name] haspreviously worked with [facility]. [facility] hospital liaison assisted with getting THNConsent form signed by making his mark, stating he could not write.Patient gave [phone] the best number to reach him. He also gave written permission to call his niece [name] at [phone] he can not be reached.   THN CM received a referral to engage for transition of care calls and evaluate for monthly home visits.  Rickey Butcherwas discharged 12/14/16, in stable condition on a 10 day course of doxycycline, order to increase activity slowly and a low sodium heart healthy diet. He was admitted on 12/12/16 for left foot cellulitis, chronic wound, small ulcer plantar aspect of firstmetatarsal.. He was placed on IV antibiotics, ceftriaxone and clindamycin. IV vancomycin was avoided due to AKI  Subjective: " My foot is swelling and hurt today" Pt confirms he has not been following his activity recommendations to increase activity slowly and has been on his feet most of the day   Objective:  BP 130/64   Pulse 81   Temp 98.5 F (36.9 C) (Oral)   Resp 20   Ht 1.778 m (5\' 10" )   Wt 234 lb (106.1 kg)   SpO2 97%   BMI 33.58 kg/m    Review of Systems  Constitutional: Negative for chills, diaphoresis, fever, malaise/fatigue and weight loss.  HENT: Positive for hearing loss. Negative for congestion, ear discharge, ear pain, nosebleeds, sinus pain, sore throat and tinnitus.   Eyes: Negative for blurred vision, double vision,  photophobia, pain, discharge and redness.  Respiratory: Negative for cough, hemoptysis, sputum production, shortness of breath, wheezing and stridor.   Cardiovascular: Positive for leg swelling. Negative for chest pain, palpitations, orthopnea and claudication.  Gastrointestinal: Negative for abdominal pain, blood in stool, constipation, diarrhea, heartburn, melena, nausea and vomiting.  Genitourinary: Negative for dysuria, flank pain, frequency, hematuria and urgency.  Musculoskeletal: Positive for joint pain. Negative for back pain, falls, myalgias and neck pain.  Skin: Negative for itching and rash.  Neurological: Positive for weakness. Negative for dizziness, tingling, tremors, sensory change, speech change, focal weakness, seizures, loss of consciousness and headaches.  Endo/Heme/Allergies: Negative for environmental allergies and polydipsia. Does not bruise/bleed easily.  Psychiatric/Behavioral: Positive for depression. Negative for hallucinations, memory loss, substance abuse and suicidal ideas. The patient is not nervous/anxious and does not have insomnia.     Physical Exam  Constitutional: He is oriented to person, place, and time. He appears well-developed and well-nourished.  HENT:  Head: Normocephalic and atraumatic.  Right Ear: External ear normal.  Left Ear: External ear normal.  Eyes: Conjunctivae are normal. Pupils are equal, round, and reactive to light.  Neck: Normal range of motion. Neck supple.  Cardiovascular: Normal rate and normal heart sounds.   Respiratory: Effort normal and breath sounds normal.  GI: Soft. Bowel sounds are normal.  Musculoskeletal: He exhibits edema.  left foot swollen- Bandage to left foot clean/not soiled when shoe and sock removed.Encouraged rest and pain medications as ordered.  Reports he has a few more antibiotics left.  Neurological: He is alert and oriented to person,  place, and time.  Skin: Skin is warm and dry.  Psychiatric: His behavior  is normal. Judgment and thought content normal.  Tearful a x 1 when he mentioned mowing the lawn every Thursday " They don't care about me" comfort measures provided    Encounter Medications:   Outpatient Encounter Prescriptions as of 01/19/2017  Medication Sig  . acetaminophen (TYLENOL) 500 MG tablet Take 1,000 mg by mouth 2 (two) times daily as needed for headache. For pain/headaches  . alprazolam (XANAX) 2 MG tablet Take 1 mg by mouth 3 (three) times daily.   Marland Kitchen amiodarone (PACERONE) 200 MG tablet Take 1 tablet (200 mg total) by mouth daily.  Marland Kitchen apixaban (ELIQUIS) 5 MG TABS tablet Take 1 tablet (5 mg total) by mouth 2 (two) times daily.  Marland Kitchen atorvastatin (LIPITOR) 20 MG tablet Take 1 tablet (20 mg total) by mouth daily at 6 PM.  . docusate sodium (COLACE) 100 MG capsule Take 100 mg by mouth 2 (two) times daily.  Marland Kitchen doxycycline (VIBRAMYCIN) 100 MG capsule Take 1 capsule (100 mg total) by mouth 2 (two) times daily.  . fenofibrate (TRICOR) 145 MG tablet TAKE ONE TABLET EACH DAY.  . ferrous sulfate 325 (65 FE) MG EC tablet Take 325 mg by mouth daily with breakfast.  . gabapentin (NEURONTIN) 300 MG capsule Take 300 mg by mouth 2 (two) times daily.   . insulin glargine (LANTUS) 100 UNIT/ML injection Inject 0.6-0.75 mLs (60-75 Units total) into the skin 2 (two) times daily. 75 units in the morning then 60 units at bedtime  . insulin lispro (HUMALOG) 100 UNIT/ML injection Inject 0.35-0.5 mLs (35-50 Units total) into the skin 3 (three) times daily before meals. Patient states that he takes 35 units at breakfast, 50 units at dinner and 50 units at supper.  . INVOKANA 100 MG TABS tablet Take 1 tablet by mouth daily.  Marland Kitchen levothyroxine (SYNTHROID, LEVOTHROID) 25 MCG tablet Take 25 mcg by mouth daily before breakfast.  . meclizine (ANTIVERT) 25 MG tablet Take 25 mg by mouth 3 (three) times daily as needed for dizziness.  . metoprolol succinate (TOPROL-XL) 25 MG 24 hr tablet Take 1 tablet (25 mg total) by mouth  daily.  . Omega-3 Fatty Acids (FISH OIL) 1200 MG CAPS Take by mouth.  . pantoprazole (PROTONIX) 40 MG tablet Take 1 tablet (40 mg total) by mouth 2 (two) times daily.  Marland Kitchen torsemide (DEMADEX) 10 MG tablet Take 1 tablet (10 mg total) by mouth daily.   No facility-administered encounter medications on file as of 01/19/2017.     Functional Status:   In your present state of health, do you have any difficulty performing the following activities: 01/05/2017 12/12/2016  Hearing? Y N  Vision? - N  Difficulty concentrating or making decisions? Y N  Walking or climbing stairs? Y N  Dressing or bathing? N N  Doing errands, shopping? Y N  Preparing Food and eating ? N -  Using the Toilet? N -  In the past six months, have you accidently leaked urine? N -  Do you have problems with loss of bowel control? N -  Managing your Medications? Y -  Managing your Finances? Y -  Housekeeping or managing your Housekeeping? Y -  Some recent data might be hidden    Fall/Depression Screening:    Fall Risk  12/28/2015 11/02/2015 09/30/2015  Falls in the past year? Yes Yes Yes  Number falls in past yr: 2 or more 1 1  Injury with  Fall? No No No  Risk for fall due to : Impaired balance/gait - Impaired balance/gait  Risk for fall due to (comments): - - -  Follow up Falls prevention discussed - Falls prevention discussed   PHQ 2/9 Scores 11/25/2015 11/02/2015 09/30/2015 09/21/2015 08/25/2015 08/21/2015 07/30/2015  PHQ - 2 Score 0 2 2 2  0 0 0  PHQ- 9 Score - 7 7 7  - - -    Assessment:    Pt presented in sitting in his recliner after watching tv "stories" left foot red with bandage in tact States wound care clinic want him to go back to hospital Has new antibiotics Painful on a scale of 9 today shooting Pt went to wound center today and came home to mow the back yard on riding mover Had on black sneakers and states he was told by the wound care center to wear them versus his leather sandals. Left foot is  tender to  touch He inquired about home health services but [facility] CM discussed he is not home bound He confirms he drove himself to wound care clinic and mowed his lawn today Pt also reports not having good family support to have home health services.  Got new doxycycline bid from Dr Gerarda Fraction on 01/10/17  Encouraged elevation  Of his left leg/foot.  cbg 112 today- wt now 234 lb weighs every 1 -2 days on his home scale.  Counseled to weigh daily  Pt states he did not know of his hx of CHF and only thought his sister who died had it.  CM showed him documentation of CHF related issues but he confirms he is not able to read nor write  [facility] CM reviewed basic details for causes of CHF and CHF zones in his [facility] calendar to report to pcp and to decide if he needs to go to urgent care or ED.  Checked [facility] plan for local urgent care center and the closet in network ones are in [place] and [place].  Plan:  Follow up with [name] in 1-2 weeks Contact Anderson Malta for [employer] CHF tele-monitoring program to inform her Pt is not a good candidate daily CHF tele-monitoring related to not being able to manage technology used in program  [facility] CM Care Plan Problem One     Most Recent Value  Care Plan Problem One  (P) wound left foot   Role Documenting the Problem One  (P) Care Management Coordinator  Care Plan for Problem One  (P) Active  THN Long Term Goal   (P) over the next 45 days patient will verbalize Knowledge of home care of left foot cellulitis wound  THN Long Term Goal Start Date  (P) 12/22/16  Interventions for Problem One Long Term Goal  (P) assess left foot, teach home care monitor progress  THN CM Short Term Goal #1   (P) Over the next 30 days left foot would will show signs and symptoms of healing   THN CM Short Term Goal #1 Start Date  (P) 12/22/16  Interventions for Short Term Goal #1  (P) assess left foot, teach home care monitor progress    [facility] CM Care Plan Problem Two     Most Recent Value  Care Plan  Problem Two  (P) Knowledge deficit of home care for chronic medical conditions (CHF, DM, afib, HTN)  Role Documenting the Problem Two  (P) Care Management Coordinator  Care Plan for Problem Two  (P) Active  Interventions for Problem Two Long  Term Goal   (P) assess needs, educate on zones of care and home care for chronic medical conditions, evaluate understanding, CHF scale/weight program  THN Long Term Goal  (P) over the next 45 days patient will voice better understanding of home care for chroinc medical conditions  THN Long Term Goal Start Date  (P) 12/22/16  THN CM Short Term Goal #1   (P) over the next 30 days patient will be able to verbalized zones of care, what to reprot to doctor and how to care for CHF, afib, htn, DM  THN CM Short Term Goal #1 Start Date  (P) 12/22/16  Interventions for Short Term Goal #2   (P) assess needs, educate on zones of care and home care for chronic medical conditions, evaluate understanding, CHF scale/weight program       Loisann Roach L. Lavina Hamman, RN, BSN, [place] Care Management [phone]

## 2017-01-19 NOTE — Therapy (Signed)
[name], [name], [address] Phone: [phone]   Fax:  [phone]  Wound Care Therapy  Patient Details  Name: [name] MRN: [id number] Date of Birth: September 26, 1945 Referring Provider: Nicholes Mango  Encounter Date: 01/19/2017      PT End of Session - 01/19/17 0857    Visit Number 9   Number of Visits 12   Date for PT Re-Evaluation 01/20/17   Authorization Type UHC medicare   Authorization - Visit Number 9   Authorization - Number of Visits 12   PT Start Time 0820   PT Stop Time 0850   PT Time Calculation (min) 30 min   Activity Tolerance Patient tolerated treatment well   Behavior During Therapy [facility] for tasks assessed/performed      Past Medical History:  Diagnosis Date  . At risk for sudden cardiac death 2013-06-10  . Cataract   . Chronic anticoagulation 05/2013   started  . Chronic kidney disease    Patient reports he is seeing Kidney doctor in September  . Coronary artery disease   . Diabetes mellitus    type 2   . Dyslipidemia   . GERD (gastroesophageal reflux disease)   . History of valve replacement 2010   [facility] Ease bioprosthetic 72mm  . Hypertension   . PAF (paroxysmal atrial fibrillation) ([place]) 05/2013   converted with amiodarone to SR  . S/P CABG x 3 2010  . S/P cardiac catheterization, Rt & Lt heart cath 06/05/2013 10-Jun-2013  . Stroke [facility]) 2003   R-sided weakness & upper extremity swelling   . Thyroid disease     Past Surgical History:  Procedure Laterality Date  . AORTIC VALVE REPLACEMENT  01/26/2009   [facility] Ease bioprosthetic 53mm valve  . CARDIAC CATHETERIZATION  06/05/2013   native 3 vessel disease; patent LIMA to LAD, SVG to OM and SVG to PDA; Elevated right and left heart filling pressures, with predominantly pulmonary venous hypertension, normal PVR  . COLONOSCOPY  2007   Dr. Aviva Signs: internal hemorrhoids  . COLONOSCOPY N/A 06/16/2016    Procedure: COLONOSCOPY;  Surgeon: Rogene Houston, MD;  Location[facility];  Service: Endoscopy;  Laterality: N/A;  1:45  . CORONARY ARTERY BYPASS GRAFT  01/26/2009   LIMA to LAD, SVG to circumflex, SVG to PDA  . GRAFT(S) ANGIOGRAM  06/05/2013   Procedure: GRAFT(S) Cyril Loosen;  Surgeon: Leonie Man, MD;  Location: [facility] CATH LAB;  Service: Cardiovascular;;  . IR GENERIC HISTORICAL  05/12/2016   IR RADIOLOGIST EVAL & MGMT 05/12/2016 Jacqulynn Cadet, MD [facility] INTERV RAD  . LEFT AND RIGHT HEART CATHETERIZATION WITH CORONARY ANGIOGRAM N/A 06/05/2013   Procedure: LEFT AND RIGHT HEART CATHETERIZATION WITH CORONARY ANGIOGRAM;  Surgeon: Leonie Man, MD;  [facility] C[facility] LAB;  Service: Cardiovascular;  Laterality: N/A;  . TRANSTHORACIC ECHOCARDIOGRAM  09/2011   grade 1 diastolic dysfunction; increasing valve gradient; calcified MV annulus   . TRANSTHORACIC ECHOCARDIOGRAM  06/03/2013   EF 25-30%, mod conc. hypertrophy, grade 3 diastolic dysfunction; LA mod dilated; calcified MV annulus; transaortic gradients are normal for the bioprosthetic valve; inf vena cava dilated (elevated CVP) - LifeVest    There were no vitals filed for this visit.                  Wound Therapy - 01/19/17 0852    Subjective Pt states his foot doesn't hurt until middle of night, when he's off of  it.  States he's still on the same antibiotic.  Order not received for treat new wound, order refaxed.    Patient and Family Stated Goals wound to heal    Date of Onset 09/15/16   Prior Treatments self care, antibiotics, IP hospitalization    Pain Assessment 0-10   Pain Score 2    Pain Type Acute pain   Pain Location Foot   Pain Orientation Left;Medial   Pain Descriptors / Indicators Sore   Pain Onset With Activity   Pain Intervention(s) Medication (See eMAR)   Multiple Pain Sites No   Evaluation and Treatment Procedures Explained to Patient/Family Yes   Evaluation and Treatment Procedures agreed to    Wound Properties Date First Assessed: 01/17/17 Time First Assessed: 0814 Wound Type: Non-pressure wound   Dressing Type None   Dressing Change Frequency PRN   Site / Wound Assessment Painful;Red;Other (Comment)  swollen   % Wound base Red or Granulating 100%   % Wound base Yellow/Fibrinous Exudate 0%   Peri-wound Assessment Edema;Erythema (blanchable)  red halo around wound 4.5 cm in diameter.    Drainage Amount Scant   Drainage Description Serous   Treatment Cleansed;Debridement (Selective)   Selective Debridement - Location callous  callous from plantar wound which is healed; dead skin medial   Selective Debridement - Tools Used Forceps;Scissors   Selective Debridement - Tissue Removed callous   Wound Therapy - Clinical Statement plantar aspect wound continues to show no signs of reopening and appears healed.  Minimal callous noted.  Area on the medial aspect of his first MTP still with redness, edema and sore to touch.  No drainage from this area, however when cleansed the foot began to bleed.  No debridment initiated to this area as still have not received order to treat.  Dressed foot with medihoney.    Order was re-faxed to MD allow therapist to work on this wound. Culture order was signed and completed last session but no results as of yet.  Therapist urged pt to return to  MD or ER about foot. Pt is wearing his diabetic shoes today.    Factors Delaying/Impairing Wound Healing Altered sensation;Diabetes Mellitus;Infection - systemic/local;Immobility;Multiple medical problems;Polypharmacy;Vascular compromise;Other (comment)   Hydrotherapy Plan Debridement;Dressing change;Patient/family education;Pulsatile lavage with suction   Wound Therapy - Frequency Other (comment)  2x/week for 6 weeks    Wound Therapy - Current Recommendations PT   Wound Plan continue to moinitor medial aspect of first metatarsal.  Begin treatment when order received.    Dressing  medihoney, Robley Fries, medipore                    PT Short Term Goals - 01/17/17 7893      PT SHORT TERM GOAL #1   Title Pt wound depth to be no greater than .6   Time 3   Period Weeks   Status Achieved     PT SHORT TERM GOAL #2   Title Callous around Lt plantar wound to be gone to decrease pressure placed on wound when walking    Time 3   Period Weeks   Status Achieved     PT SHORT TERM GOAL #3   Title Redness and edema along LT 1st MTP to be gone to demonstrate the absence of infection.    Time 3   Period Weeks   Status On-going     PT SHORT TERM GOAL #4   Title Pt to verbalize the importance of keeping his sugar  levels around 100 to allow healing of wound.   Time 3   Period Weeks   Status Achieved           PT Long Term Goals - 01/17/17 1884      PT LONG TERM GOAL #1   Title Wound depth to be no greater than .2 to allow pt to feel comfortable with self treatment.    Time 6   Period Weeks   Status Achieved     PT LONG TERM GOAL #2   Title Wound on Lt plantar aspect to have no undermining to allow pt to feel cmforable with self treatment.    Time 6   Period Weeks   Status Achieved     PT LONG TERM GOAL #3   Title wound on medial aspect of Lt 1st MTP to be healed   01/17/2017   Time 4   Period Weeks   Status New     PT LONG TERM GOAL #4   Title Pt to be wearing diabetic shoes as to not rub any more wounds on pt feet.   01/17/2017   Time 2   Period Days     PT LONG TERM GOAL #5   Title PT pain to be no greater than a 2/10 in his left foot   01/17/2017   Time 2   Period Weeks   Status New             Patient will benefit from skilled therapeutic intervention in order to improve the following deficits and impairments:     Visit Diagnosis: Diabetic ulcer of left midfoot associated with diabetes mellitus due to underlying condition, with muscle involvement without evidence of necrosis ([place])  Difficulty in walking, not elsewhere classified  Localized edema  Pain in  left foot     Problem List Patient Active Problem List   Diagnosis Date Noted  . Cellulitis of left foot 12/12/2016  . Nausea & vomiting 02/24/2016  . A-fib ([place]) 02/23/2016  . Hypothyroidism 02/23/2016  . CKD (chronic kidney disease), stage III 02/23/2016  . Anxiety 02/23/2016  . Chronic combined systolic (congestive) and diastolic (congestive) heart failure ([place]) 02/23/2016  . Dizziness 02/23/2016  . Open wound of right foot 02/23/2016  . Rectal bleeding 07/28/2015  . Chronic anticoagulation 07/28/2015  . PVD (peripheral vascular disease) ([place])   . S/P angioplasty 04/23/2015  . Peripheral arterial occlusive disease ([place]) 04/23/2015  . PAD (peripheral artery disease) ([place])   . Critical lower limb ischemia   . Toe ulcer, right ([place])   . Type II or unspecified type diabetes mellitus without mention of complication, uncontrolled 11/27/2013  . Acute respiratory failure with hypoxia ([place]) 11/26/2013  . CAP (community acquired pneumonia) 11/26/2013  . CHF exacerbation ([place]) 11/25/2013  . Cardiomyopathy, ischemic 06/13/2013  . At risk for sudden cardiac death 07-03-13  . S/P cardiac catheterization, Rt & Lt heart cath 06/05/2013 03-Jul-2013  . Acute on chronic combined systolic and diastolic HF (heart failure), NYHA class 3 ([place]) 06/04/2013  . Atrial flutter with rapid ventricular response ([place]) 06/02/2013  . Acute renal failure: Cr up to 1.8 06/02/2013  . Hyperkalemia 06/02/2013    Class: Acute  . LBBB (left bundle branch block) 06/01/2013  . Hypotension- secondary to AF and Diltiazem Rx 06/01/2013  . Obesity (BMI 30-39.9) 06/01/2013  . Acute on chronic diastolic heart failure - due to Afib AVR 06/01/2013  . Atrial fibrillation with RVR- converted with Amiodarone 05/31/2013  . Stroke- Lt  brain 2003 04/08/2013  . Hemiplegia affecting right dominant side ([place]) 04/08/2013  . Cataract 04/08/2013  . GERD (gastroesophageal reflux disease) 04/08/2013  . S/P CABG x 3 - 2010  04/08/2013  . S/P AVR-tissue, 2010 04/08/2013  . DM2 (diabetes mellitus, type 2) ([place]) 04/08/2013  . Dyslipidemia 04/08/2013  . SUBACROMIAL BURSITIS, LEFT 11/05/2009    Teena Irani, PTA/CLT [phone]  01/19/2017, 8:58 AM  [place] [address] [place], [name], [address] Phone: [phone]   Fax:  [phone]  Name: [name] MRN: [id number] Date of Birth: Dec 21, 1945

## 2017-01-20 ENCOUNTER — Telehealth: Payer: Self-pay | Admitting: *Deleted

## 2017-01-20 NOTE — Patient Outreach (Signed)
[facility]) Care Management  01/20/2017  [name] [birth year] [id number]   THN Cm left a voice message for [name] of [facility] CHF telemonitoring to discuss pt barriers of difficulty reading and writing  Discussed inability to use a tablet as CM worked with one and his mobile phone with him during home visit  Pt may not benefit from program referral  Left THN CM return mobile number for a return call as needed  Plans to see pt for home visit to continue to work with him on wound care and chronic medical needs  Ticia Virgo L. Lavina Hamman, RN, BSN, [place] Care Management [phone]

## 2017-01-24 ENCOUNTER — Telehealth (HOSPITAL_COMMUNITY): Payer: Self-pay | Admitting: Physical Therapy

## 2017-01-24 ENCOUNTER — Ambulatory Visit (HOSPITAL_COMMUNITY): Payer: Medicare Other | Admitting: Physical Therapy

## 2017-01-24 DIAGNOSIS — E08621 Diabetes mellitus due to underlying condition with foot ulcer: Secondary | ICD-10-CM | POA: Diagnosis not present

## 2017-01-24 DIAGNOSIS — L97425 Non-pressure chronic ulcer of left heel and midfoot with muscle involvement without evidence of necrosis: Secondary | ICD-10-CM | POA: Diagnosis not present

## 2017-01-24 DIAGNOSIS — M79672 Pain in left foot: Secondary | ICD-10-CM | POA: Diagnosis not present

## 2017-01-24 DIAGNOSIS — R262 Difficulty in walking, not elsewhere classified: Secondary | ICD-10-CM | POA: Diagnosis not present

## 2017-01-24 DIAGNOSIS — R600 Localized edema: Secondary | ICD-10-CM | POA: Diagnosis not present

## 2017-01-24 NOTE — Telephone Encounter (Signed)
MD office called back [name]) said that new script had been sent to pharmacy for this pt. I will call pt back and inform him to go pick up new meds. NF 01/24/17

## 2017-01-24 NOTE — Telephone Encounter (Signed)
Pt called requested that we call Dr. Gerarda Fraction office and let them know he needed an antibotic.I called talked to [facility] she will send a message to the MD on staff (Dr. Dian Queen is out) and request a new antibotic for patient. A culture was done on 01/17/17. Advised pt to call pharmacy at lunch time to see if they have his script for new meds. NF 01/24/17

## 2017-01-24 NOTE — Therapy (Signed)
[place], [name], [address] Phone: [phone]   Fax:  [phone]  Wound Care Therapy  Patient Details  Name: [name] MRN: [id number] Date of Birth: [birth year] Referring Provider: Nicholes Mango  Encounter Date: 01/24/2017      PT End of Session - 01/24/17 1500    Visit Number 10   Number of Visits 12   Date for PT Re-Evaluation 01/20/17   Authorization Type UHC medicare   Authorization - Visit Number 10   Authorization - Number of Visits 12   PT Start Time 0818   PT Stop Time 0840   PT Time Calculation (min) 22 min   Activity Tolerance Patient tolerated treatment well   Behavior During Therapy [facility] for tasks assessed/performed      Past Medical History:  Diagnosis Date  . At risk for sudden cardiac death July 01, 2013  . Cataract   . Chronic anticoagulation 05/2013   started  . Chronic kidney disease    Patient reports he is seeing Kidney doctor in September  . Coronary artery disease   . Diabetes mellitus    type 2   . Dyslipidemia   . GERD (gastroesophageal reflux disease)   . History of valve replacement 2010   [facility] Ease bioprosthetic 83mm  . Hypertension   . PAF (paroxysmal atrial fibrillation) ([place]) 05/2013   converted with amiodarone to SR  . S/P CABG x 3 2010  . S/P cardiac catheterization, Rt & Lt heart cath 06/05/2013 Jul 01, 2013  . Stroke [facility]) 2003   R-sided weakness & upper extremity swelling   . Thyroid disease     Past Surgical History:  Procedure Laterality Date  . AORTIC VALVE REPLACEMENT  01/26/2009   [employer] Ease bioprosthetic 3mm valve  . CARDIAC CATHETERIZATION  06/05/2013   native 3 vessel disease; patent LIMA to LAD, SVG to OM and SVG to PDA; Elevated right and left heart filling pressures, with predominantly pulmonary venous hypertension, normal PVR  . COLONOSCOPY  2007   Dr. Aviva Signs: internal hemorrhoids  . COLONOSCOPY N/A 06/16/2016    Procedure: COLONOSCOPY;  Surgeon: Rogene Houston, MD;  [facility];  Service: Endoscopy;  Laterality: N/A;  1:45  . CORONARY ARTERY BYPASS GRAFT  01/26/2009   LIMA to LAD, SVG to circumflex, SVG to PDA  . GRAFT(S) ANGIOGRAM  06/05/2013   Procedure: GRAFT(S) Cyril Loosen;  Surgeon: Leonie Man, MD;  Location: [facility] CATH LAB;  Service: Cardiovascular;;  . IR GENERIC HISTORICAL  05/12/2016   IR RADIOLOGIST EVAL & MGMT 05/12/2016 Jacqulynn Cadet, MD [facility] INTERV RAD  . LEFT AND RIGHT HEART CATHETERIZATION WITH CORONARY ANGIOGRAM N/A 06/05/2013   Procedure: LEFT AND RIGHT HEART CATHETERIZATION WITH CORONARY ANGIOGRAM;  Surgeon: Leonie Man, MD;  Location: [facility] CATH LAB;  Service: Cardiovascular;  Laterality: N/A;  . TRANSTHORACIC ECHOCARDIOGRAM  09/2011   grade 1 diastolic dysfunction; increasing valve gradient; calcified MV annulus   . TRANSTHORACIC ECHOCARDIOGRAM  06/03/2013   EF 25-30%, mod conc. hypertrophy, grade 3 diastolic dysfunction; LA mod dilated; calcified MV annulus; transaortic gradients are normal for the bioprosthetic valve; inf vena cava dilated (elevated CVP) - LifeVest    There were no vitals filed for this visit.                  Wound Therapy - 01/24/17 1452    Subjective Pt states currently without pain but will hurt when he lays down at night.  STates he is out of antibiotics and hasn't heard from MD regarding 6/5 wound culture.   Patient and Family Stated Goals wound to heal    Date of Onset 09/15/16   Prior Treatments self care, antibiotics, IP hospitalization    Pain Assessment No/denies pain   Evaluation and Treatment Procedures Explained to Patient/Family Yes   Evaluation and Treatment Procedures agreed to   Wound Properties Date First Assessed: 01/17/17 Time First Assessed: 0814 Wound Type: Non-pressure wound   Dressing Type Gauze (Comment)  medihoney gel   Dressing Changed Changed   Dressing Status Intact   Dressing Change Frequency PRN    Site / Wound Assessment Painful;Red;Other (Comment)  swollen   % Wound base Red or Granulating 100%   % Wound base Yellow/Fibrinous Exudate 0%   Peri-wound Assessment Edema;Erythema (blanchable)  red halo around wound 4.5 cm in diameter.    Drainage Amount Minimal   Drainage Description Serous   Treatment Cleansed;Debridement (Selective)   Selective Debridement - Location callous  callous from plantar wound which is healed; dead skin medial   Selective Debridement - Tools Used Forceps;Scissors   Selective Debridement - Tissue Removed callous   Wound Therapy - Clinical Statement order received to treat MTP wound.  Remvoed all callous, dry skin away from area, however unable to find an opening.  Palpated "mushy" fluid filled area, however unable to express any drainage from wound.  Cleansed area well and continued bandaging with medihoney gel.  Urged pateint to contact MD regarding results of culture and need to continue/change antibiotic.  Redness remains same as last session with no worsening or improvment of area.    Factors Delaying/Impairing Wound Healing Altered sensation;Diabetes Mellitus;Infection - systemic/local;Immobility;Multiple medical problems;Polypharmacy;Vascular compromise;Other (comment)   Hydrotherapy Plan Debridement;Dressing change;Patient/family education;Pulsatile lavage with suction   Wound Therapy - Frequency Other (comment)  2x/week for 6 weeks    Wound Therapy - Current Recommendations PT   Wound Plan continue with wound care to medial aspect of first metatarsal.     Dressing  medihoney, 2X2, medipore                   PT Short Term Goals - 01/17/17 4098      PT SHORT TERM GOAL #1   Title Pt wound depth to be no greater than .6   Time 3   Period Weeks   Status Achieved     PT SHORT TERM GOAL #2   Title Callous around Lt plantar wound to be gone to decrease pressure placed on wound when walking    Time 3   Period Weeks   Status Achieved      PT SHORT TERM GOAL #3   Title Redness and edema along LT 1st MTP to be gone to demonstrate the absence of infection.    Time 3   Period Weeks   Status On-going     PT SHORT TERM GOAL #4   Title Pt to verbalize the importance of keeping his sugar levels around 100 to allow healing of wound.   Time 3   Period Weeks   Status Achieved           PT Long Term Goals - 01/17/17 1191      PT LONG TERM GOAL #1   Title Wound depth to be no greater than .2 to allow pt to feel comfortable with self treatment.    Time 6   Period Weeks   Status Achieved     PT  LONG TERM GOAL #2   Title Wound on Lt plantar aspect to have no undermining to allow pt to feel cmforable with self treatment.    Time 6   Period Weeks   Status Achieved     PT LONG TERM GOAL #3   Title wound on medial aspect of Lt 1st MTP to be healed   01/17/2017   Time 4   Period Weeks   Status New     PT LONG TERM GOAL #4   Title Pt to be wearing diabetic shoes as to not rub any more wounds on pt feet.   01/17/2017   Time 2   Period Days     PT LONG TERM GOAL #5   Title PT pain to be no greater than a 2/10 in his left foot   01/17/2017   Time 2   Period Weeks   Status New       G8990 CK G8991 CI      Patient will benefit from skilled therapeutic intervention in order to improve the following deficits and impairments:     Visit Diagnosis: Diabetic ulcer of left midfoot associated with diabetes mellitus due to underlying condition, with muscle involvement without evidence of necrosis ([place])  Difficulty in walking, not elsewhere classified  Localized edema  Pain in left foot     Problem List Patient Active Problem List   Diagnosis Date Noted  . Cellulitis of left foot 12/12/2016  . Nausea & vomiting 02/24/2016  . A-fib ([place]) 02/23/2016  . Hypothyroidism 02/23/2016  . CKD (chronic kidney disease), stage III 02/23/2016  . Anxiety 02/23/2016  . Chronic combined systolic (congestive) and diastolic  (congestive) heart failure ([place]) 02/23/2016  . Dizziness 02/23/2016  . Open wound of right foot 02/23/2016  . Rectal bleeding 07/28/2015  . Chronic anticoagulation 07/28/2015  . PVD (peripheral vascular disease) ([place])   . S/P angioplasty 04/23/2015  . Peripheral arterial occlusive disease ([place]) 04/23/2015  . PAD (peripheral artery disease) ([place])   . Critical lower limb ischemia   . Toe ulcer, right ([place])   . Type II or unspecified type diabetes mellitus without mention of complication, uncontrolled 11/27/2013  . Acute respiratory failure with hypoxia ([place]) 11/26/2013  . CAP (community acquired pneumonia) 11/26/2013  . CHF exacerbation ([place]) 11/25/2013  . Cardiomyopathy, ischemic 06/13/2013  . At risk for sudden cardiac death Jun 18, 2013  . S/P cardiac catheterization, Rt & Lt heart cath 06/05/2013 18-Jun-2013  . Acute on chronic combined systolic and diastolic HF (heart failure), NYHA class 3 ([place]) 06/04/2013  . Atrial flutter with rapid ventricular response ([place]) 06/02/2013  . Acute renal failure: Cr up to 1.8 06/02/2013  . Hyperkalemia 06/02/2013    Class: Acute  . LBBB (left bundle branch block) 06/01/2013  . Hypotension- secondary to AF and Diltiazem Rx 06/01/2013  . Obesity (BMI 30-39.9) 06/01/2013  . Acute on chronic diastolic heart failure - due to Afib AVR 06/01/2013  . Atrial fibrillation with RVR- converted with Amiodarone 05/31/2013  . Stroke- Lt brain 2003 04/08/2013  . Hemiplegia affecting right dominant side ([place]) 04/08/2013  . Cataract 04/08/2013  . GERD (gastroesophageal reflux disease) 04/08/2013  . S/P CABG x 3 - 2010 04/08/2013  . S/P AVR-tissue, 2010 04/08/2013  . DM2 (diabetes mellitus, type 2) ([place]) 04/08/2013  . Dyslipidemia 04/08/2013  . SUBACROMIAL BURSITIS, LEFT 11/05/2009    Teena Irani, PTA/CLT [place], PT CLT [phone] 01/24/2017, 3:02 PM  [address]  [place], [name], [address] Phone: [phone]   Fax:  [phone]  Name: [name] MRN: [id number] Date of Birth: 1945-09-25

## 2017-01-26 ENCOUNTER — Ambulatory Visit (HOSPITAL_COMMUNITY): Payer: Medicare Other | Admitting: Physical Therapy

## 2017-01-26 DIAGNOSIS — R600 Localized edema: Secondary | ICD-10-CM

## 2017-01-26 DIAGNOSIS — E08621 Diabetes mellitus due to underlying condition with foot ulcer: Secondary | ICD-10-CM | POA: Diagnosis not present

## 2017-01-26 DIAGNOSIS — R262 Difficulty in walking, not elsewhere classified: Secondary | ICD-10-CM | POA: Diagnosis not present

## 2017-01-26 DIAGNOSIS — M79672 Pain in left foot: Secondary | ICD-10-CM | POA: Diagnosis not present

## 2017-01-26 DIAGNOSIS — L97425 Non-pressure chronic ulcer of left heel and midfoot with muscle involvement without evidence of necrosis: Principal | ICD-10-CM

## 2017-01-26 NOTE — Therapy (Signed)
[place], [name], [address] Phone: [phone]   Fax:  [phone]  Wound Care Therapy  Patient Details  Name: [name] MRN: [id number] Date of Birth: 07-14-1946 Referring Provider: Nicholes Mango  Encounter Date: 01/26/2017      PT End of Session - 01/26/17 0943    Visit Number 11   Number of Visits 24   Date for PT Re-Evaluation 01/20/17   Authorization Type UHC medicare   Authorization - Visit Number 11   Authorization - Number of Visits 20   PT Start Time 0900   PT Stop Time 0925   PT Time Calculation (min) 25 min   Activity Tolerance Patient tolerated treatment well   Behavior During Therapy [facility] for tasks assessed/performed      Past Medical History:  Diagnosis Date  . At risk for sudden cardiac death 07/03/13  . Cataract   . Chronic anticoagulation 05/2013   started  . Chronic kidney disease    Patient reports he is seeing Kidney doctor in September  . Coronary artery disease   . Diabetes mellitus    type 2   . Dyslipidemia   . GERD (gastroesophageal reflux disease)   . History of valve replacement 2010   [name] Ease bioprosthetic 50mm  . Hypertension   . PAF (paroxysmal atrial fibrillation) ([place]) 05/2013   converted with amiodarone to SR  . S/P CABG x 3 2010  . S/P cardiac catheterization, Rt & Lt heart cath 06/05/2013 2013-07-03  . Stroke [facility]) 2003   R-sided weakness & upper extremity swelling   . Thyroid disease     Past Surgical History:  Procedure Laterality Date  . AORTIC VALVE REPLACEMENT  01/26/2009   [facility] Ease bioprosthetic 36mm valve  . CARDIAC CATHETERIZATION  06/05/2013   native 3 vessel disease; patent LIMA to LAD, SVG to OM and SVG to PDA; Elevated right and left heart filling pressures, with predominantly pulmonary venous hypertension, normal PVR  . COLONOSCOPY  2007   Dr. Aviva Signs: internal hemorrhoids  . COLONOSCOPY N/A 06/16/2016   Procedure: COLONOSCOPY;  Surgeon: Rogene Houston, MD;  Location: [facility] ENDO SUITE;  Service: Endoscopy;  Laterality: N/A;  1:45  . CORONARY ARTERY BYPASS GRAFT  01/26/2009   LIMA to LAD, SVG to circumflex, SVG to PDA  . GRAFT(S) ANGIOGRAM  06/05/2013   Procedure: GRAFT(S) Cyril Loosen;  Surgeon: Leonie Man, MD;  Location: [facility] CATH LAB;  Service: Cardiovascular;;  . IR GENERIC HISTORICAL  05/12/2016   IR RADIOLOGIST EVAL & MGMT 05/12/2016 Jacqulynn Cadet, MD [facility] INTERV RAD  . LEFT AND RIGHT HEART CATHETERIZATION WITH CORONARY ANGIOGRAM N/A 06/05/2013   Procedure: LEFT AND RIGHT HEART CATHETERIZATION WITH CORONARY ANGIOGRAM;  Surgeon: Leonie Man, MD;  Location: [facility] CATH LAB;  Service: Cardiovascular;  Laterality: N/A;  . TRANSTHORACIC ECHOCARDIOGRAM  09/2011   grade 1 diastolic dysfunction; increasing valve gradient; calcified MV annulus   . TRANSTHORACIC ECHOCARDIOGRAM  06/03/2013   EF 25-30%, mod conc. hypertrophy, grade 3 diastolic dysfunction; LA mod dilated; calcified MV annulus; transaortic gradients are normal for the bioprosthetic valve; inf vena cava dilated (elevated CVP) - LifeVest    There were no vitals filed for this visit.                  Wound Therapy - 01/26/17 0932    Subjective Pt states that he has been on his new antibiotic since Monday.  Pt states  at time his pain goes as high as an 8/10 but this will only be for a short period of time.   Patient and Family Stated Goals wound to heal    Date of Onset 09/15/16   Prior Treatments self care, antibiotics, IP hospitalization    Pain Assessment 0-10   Pain Score 3    Pain Type Acute pain   Pain Location Foot   Pain Orientation Left;Medial   Pain Descriptors / Indicators Sore   Pain Onset With Activity   Pain Intervention(s) Distraction;Emotional support   Evaluation and Treatment Procedures Explained to Patient/Family Yes   Evaluation and Treatment Procedures agreed to   Wound Properties Date First  Assessed: 01/17/17 Time First Assessed: 0814 Wound Type: Non-pressure wound   Dressing Type Gauze (Comment)  medihoney gel   Dressing Changed Changed   Dressing Status Intact   Dressing Change Frequency PRN   Site / Wound Assessment Painful;Red;Other (Comment)  swollen   % Wound base Red or Granulating 80%   % Wound base Yellow/Fibrinous Exudate 20%   Peri-wound Assessment Edema;Erythema (blanchable)  red halo around wound has increased    Wound Length (cm) 3.5 cm  red halo surrounds wound that is 7x6 cm    Wound Width (cm) 3 cm   Drainage Amount Minimal   Drainage Description Serous   Treatment Cleansed;Debridement (Selective)   Selective Debridement - Location callous   dead skin    Selective Debridement - Tools Used Forceps;Scissors   Selective Debridement - Tissue Removed dead skin minimal slough    Wound Therapy - Clinical Statement PT states he has been on antibiotic since Monday but chart from pt Tuesday visit states that pt stated that he was out of antibiotics thererfore therapist presumes pt meant Wednseday.  Red hallo has increased will need to assess next treatment. Dressing changed from [place] to acticoat due to no progress in wound bed at this time.  l   Factors Delaying/Impairing Wound Healing Altered sensation;Diabetes Mellitus;Infection - systemic/local;Immobility;Multiple medical problems;Polypharmacy;Vascular compromise;Other (comment)   Hydrotherapy Plan Debridement;Dressing change;Patient/family education;Pulsatile lavage with suction   Wound Therapy - Frequency Other (comment)  2x/week for 6 weeks    Wound Therapy - Current Recommendations PT   Wound Plan Assess red halo around wound and how wound reacted to change of dressing.  Continue with wound care to medial aspect of first metatarsal.     Dressing  acticoat, 2x2 and kling.                    PT Short Term Goals - 01/17/17 5631      PT SHORT TERM GOAL #1   Title Pt wound depth to be no  greater than .6   Time 3   Period Weeks   Status Achieved     PT SHORT TERM GOAL #2   Title Callous around Lt plantar wound to be gone to decrease pressure placed on wound when walking    Time 3   Period Weeks   Status Achieved     PT SHORT TERM GOAL #3   Title Redness and edema along LT 1st MTP to be gone to demonstrate the absence of infection.    Time 3   Period Weeks   Status On-going     PT SHORT TERM GOAL #4   Title Pt to verbalize the importance of keeping his sugar levels around 100 to allow healing of wound.   Time 3   Period Weeks  Status Achieved           PT Long Term Goals - 01/17/17 2993      PT LONG TERM GOAL #1   Title Wound depth to be no greater than .2 to allow pt to feel comfortable with self treatment.    Time 6   Period Weeks   Status Achieved     PT LONG TERM GOAL #2   Title Wound on Lt plantar aspect to have no undermining to allow pt to feel cmforable with self treatment.    Time 6   Period Weeks   Status Achieved     PT LONG TERM GOAL #3   Title wound on medial aspect of Lt 1st MTP to be healed   01/17/2017   Time 4   Period Weeks   Status New     PT LONG TERM GOAL #4   Title Pt to be wearing diabetic shoes as to not rub any more wounds on pt feet.   01/17/2017   Time 2   Period Days     PT LONG TERM GOAL #5   Title PT pain to be no greater than a 2/10 in his left foot   01/17/2017   Time 2   Period Weeks   Status New               Plan - 01/26/17 0942    Clinical Impression Statement see above    Rehab Potential Good   PT Frequency 2x / week   PT Duration 12 weeks   PT Treatment/Interventions ADLs/Self Care Home Management;Other (comment)  debridement with dressing changes    PT Next Visit Plan Assess how acticoat is working;  assess foot for redness and swelling.    Consulted and Agree with Plan of Care Patient      Patient will benefit from skilled therapeutic intervention in order to improve the following  deficits and impairments:  Abnormal gait, Increased edema, Pain, Other (comment) (nonhealing wound )  Visit Diagnosis: Diabetic ulcer of left midfoot associated with diabetes mellitus due to underlying condition, with muscle involvement without evidence of necrosis (HCC)  Difficulty in walking, not elsewhere classified  Localized edema  Pain in left foot     Problem List Patient Active Problem List   Diagnosis Date Noted  . Cellulitis of left foot 12/12/2016  . Nausea & vomiting 02/24/2016  . A-fib ([place]) 02/23/2016  . Hypothyroidism 02/23/2016  . CKD (chronic kidney disease), stage III 02/23/2016  . Anxiety 02/23/2016  . Chronic combined systolic (congestive) and diastolic (congestive) heart failure ([place]) 02/23/2016  . Dizziness 02/23/2016  . Open wound of right foot 02/23/2016  . Rectal bleeding 07/28/2015  . Chronic anticoagulation 07/28/2015  . PVD (peripheral vascular disease) ([place])   . S/P angioplasty 04/23/2015  . Peripheral arterial occlusive disease ([place]) 04/23/2015  . PAD (peripheral artery disease) ([place])   . Critical lower limb ischemia   . Toe ulcer, right ([place])   . Type II or unspecified type diabetes mellitus without mention of complication, uncontrolled 11/27/2013  . Acute respiratory failure with hypoxia ([place]) 11/26/2013  . CAP (community acquired pneumonia) 11/26/2013  . CHF exacerbation ([place]) 11/25/2013  . Cardiomyopathy, ischemic 06/13/2013  . At risk for sudden cardiac death Jun 13, 2013  . S/P cardiac catheterization, Rt & Lt heart cath 06/05/2013 06-13-13  . Acute on chronic combined systolic and diastolic HF (heart failure), NYHA class 3 ([place]) 06/04/2013  . Atrial flutter with rapid ventricular response ([place])  06/02/2013  . Acute renal failure: Cr up to 1.8 06/02/2013  . Hyperkalemia 06/02/2013    Class: Acute  . LBBB (left bundle branch block) 06/01/2013  . Hypotension- secondary to AF and Diltiazem Rx 06/01/2013  . Obesity (BMI 30-39.9)  06/01/2013  . Acute on chronic diastolic heart failure - due to Afib AVR 06/01/2013  . Atrial fibrillation with RVR- converted with Amiodarone 05/31/2013  . Stroke- Lt brain 2003 04/08/2013  . Hemiplegia affecting right dominant side ([place]) 04/08/2013  . Cataract 04/08/2013  . GERD (gastroesophageal reflux disease) 04/08/2013  . S/P CABG x 3 - 2010 04/08/2013  . S/P AVR-tissue, 2010 04/08/2013  . DM2 (diabetes mellitus, type 2) ([place]) 04/08/2013  . Dyslipidemia 04/08/2013  . SUBACROMIAL BURSITIS, LEFT 11/05/2009    Rayetta Humphrey, PT CLT [phone] 01/26/2017, 9:44 AM  [place] [address] [place], [name], [address] Phone: [phone]   Fax:  [phone]  Name: [name] MRN: [id number] Date of Birth: 03-01-1946

## 2017-01-27 ENCOUNTER — Encounter: Payer: Self-pay | Admitting: Interventional Radiology

## 2017-01-31 ENCOUNTER — Ambulatory Visit (HOSPITAL_COMMUNITY): Payer: Medicare Other

## 2017-01-31 DIAGNOSIS — R600 Localized edema: Secondary | ICD-10-CM | POA: Diagnosis not present

## 2017-01-31 DIAGNOSIS — M79672 Pain in left foot: Secondary | ICD-10-CM

## 2017-01-31 DIAGNOSIS — L97425 Non-pressure chronic ulcer of left heel and midfoot with muscle involvement without evidence of necrosis: Principal | ICD-10-CM

## 2017-01-31 DIAGNOSIS — E08621 Diabetes mellitus due to underlying condition with foot ulcer: Secondary | ICD-10-CM

## 2017-01-31 DIAGNOSIS — R262 Difficulty in walking, not elsewhere classified: Secondary | ICD-10-CM

## 2017-01-31 NOTE — Therapy (Signed)
[place], [name], [address] Phone: [phone]   Fax:  [phone]  Wound Care Therapy  Patient Details  Name: [name] MRN: [id number] Date of Birth: 1946/07/09 Referring Provider: Nicholes Mango  Encounter Date: 01/31/2017      PT End of Session - 01/31/17 0908    Visit Number 12   Number of Visits 24   Date for PT Re-Evaluation 01/20/17   Authorization Type UHC medicare   Authorization - Visit Number 12   Authorization - Number of Visits 20   PT Start Time 0820   PT Stop Time 0845   PT Time Calculation (min) 25 min   Activity Tolerance Patient tolerated treatment well   Behavior During Therapy [facility] for tasks assessed/performed      Past Medical History:  Diagnosis Date  . At risk for sudden cardiac death 06/12/2013  . Cataract   . Chronic anticoagulation 05/2013   started  . Chronic kidney disease    Patient reports he is seeing Kidney doctor in September  . Coronary artery disease   . Diabetes mellitus    type 2   . Dyslipidemia   . GERD (gastroesophageal reflux disease)   . History of valve replacement 2010   [facility] Ease bioprosthetic 54mm  . Hypertension   . PAF (paroxysmal atrial fibrillation) ([place]) 05/2013   converted with amiodarone to SR  . S/P CABG x 3 2010  . S/P cardiac catheterization, Rt & Lt heart cath 06/05/2013 2013/06/12  . Stroke [facility]) 2003   R-sided weakness & upper extremity swelling   . Thyroid disease     Past Surgical History:  Procedure Laterality Date  . AORTIC VALVE REPLACEMENT  01/26/2009   [facility] Ease bioprosthetic 79mm valve  . CARDIAC CATHETERIZATION  06/05/2013   native 3 vessel disease; patent LIMA to LAD, SVG to OM and SVG to PDA; Elevated right and left heart filling pressures, with predominantly pulmonary venous hypertension, normal PVR  . COLONOSCOPY  2007   Dr. Aviva Signs: internal hemorrhoids  . COLONOSCOPY N/A 06/16/2016    Procedure: COLONOSCOPY;  Surgeon: Rogene Houston, MD;  [facility];  Service: Endoscopy;  Laterality: N/A;  1:45  . CORONARY ARTERY BYPASS GRAFT  01/26/2009   LIMA to LAD, SVG to circumflex, SVG to PDA  . GRAFT(S) ANGIOGRAM  06/05/2013   Procedure: GRAFT(S) Cyril Loosen;  Surgeon: Leonie Man, MD;  Location: [facility] CATH LAB;  Service: Cardiovascular;;  . IR GENERIC HISTORICAL  05/12/2016   IR RADIOLOGIST EVAL & MGMT 05/12/2016 Jacqulynn Cadet, MD [facility] INTERV RAD  . IR RADIOLOGIST EVAL & MGMT  01/04/2017  . LEFT AND RIGHT HEART CATHETERIZATION WITH CORONARY ANGIOGRAM N/A 06/05/2013   Procedure: LEFT AND RIGHT HEART CATHETERIZATION WITH CORONARY ANGIOGRAM;  Surgeon: Leonie Man, MD;  Location: [facility] CATH LAB;  Service: Cardiovascular;  Laterality: N/A;  . TRANSTHORACIC ECHOCARDIOGRAM  09/2011   grade 1 diastolic dysfunction; increasing valve gradient; calcified MV annulus   . TRANSTHORACIC ECHOCARDIOGRAM  06/03/2013   EF 25-30%, mod conc. hypertrophy, grade 3 diastolic dysfunction; LA mod dilated; calcified MV annulus; transaortic gradients are normal for the bioprosthetic valve; inf vena cava dilated (elevated CVP) - LifeVest    There were no vitals filed for this visit.       Subjective Assessment - 01/31/17 0855    Subjective Pt with new spot on arm following dog scratch.  No reports of pain, continues with  antibiotic.  Does reports some pain BLE at nighttime                   Wound Therapy - 01/31/17 0855    Subjective Pt with new spot on arm following dog scratch.  No reports of pain, continues with antibiotic.  Does reports some pain BLE at nighttime   Patient and Family Stated Goals wound to heal    Date of Onset 09/15/16   Prior Treatments self care, antibiotics, IP hospitalization    Pain Assessment No/denies pain   Evaluation and Treatment Procedures Explained to Patient/Family Yes   Evaluation and Treatment Procedures agreed to   Wound Properties Date  First Assessed: 01/17/17 Time First Assessed: 0814 Wound Type: Non-pressure wound   Dressing Type Silver dressings  acticoat, 2x2, medipore tape and gauze   Dressing Changed Changed   Dressing Status Intact   Dressing Change Frequency PRN   Site / Wound Assessment Painful;Red;Other (Comment)  Great toe edema present   % Wound base Red or Granulating 85%   % Wound base Yellow/Fibrinous Exudate 15%   Peri-wound Assessment Edema;Erythema (blanchable)  red halo decreased to 2.5cm in diameter, great toe swollen   Drainage Amount Scant   Drainage Description Serous   Treatment Cleansed;Debridement (Selective)   Selective Debridement - Location callous  dead skin   Selective Debridement - Tools Used Forceps;Scissors   Selective Debridement - Tissue Removed dead skin minimal slough    Wound Therapy - Clinical Statement Red halo has reduced in diameter and pt reports decreased overall edema present Lt foot, great toe continues to be swollen.  Selective debridement for removal of dead/dry skin to promote healing.  Continued with articoat dressings following positive results with reduction iin halo diameter.     Wound Therapy - Functional Problem List Pt needs to stay off his foot as much as possible.  The therapis tried a [employer] shoe but pt was unable to balance and was a risk fall when using.    Factors Delaying/Impairing Wound Healing Altered sensation;Diabetes Mellitus;Infection - systemic/local;Immobility;Multiple medical problems;Polypharmacy;Vascular compromise;Other (comment)   Hydrotherapy Plan Debridement;Dressing change;Patient/family education;Pulsatile lavage with suction   Wound Therapy - Frequency --  2x/ a week for 6 weeks   Wound Therapy - Current Recommendations PT   Wound Plan Assess wound wiht current dressings, may need to change to xeroform for moisture.  continue wound care to medial aspect of first metatarsal   Dressing  vaseline, acticoat, 2x2 and kling.                     PT Short Term Goals - 01/17/17 2671      PT SHORT TERM GOAL #1   Title Pt wound depth to be no greater than .6   Time 3   Period Weeks   Status Achieved     PT SHORT TERM GOAL #2   Title Callous around Lt plantar wound to be gone to decrease pressure placed on wound when walking    Time 3   Period Weeks   Status Achieved     PT SHORT TERM GOAL #3   Title Redness and edema along LT 1st MTP to be gone to demonstrate the absence of infection.    Time 3   Period Weeks   Status On-going     PT SHORT TERM GOAL #4   Title Pt to verbalize the importance of keeping his sugar levels around 100 to allow healing of wound.  Time 3   Period Weeks   Status Achieved           PT Long Term Goals - 01/17/17 0175      PT LONG TERM GOAL #1   Title Wound depth to be no greater than .2 to allow pt to feel comfortable with self treatment.    Time 6   Period Weeks   Status Achieved     PT LONG TERM GOAL #2   Title Wound on Lt plantar aspect to have no undermining to allow pt to feel cmforable with self treatment.    Time 6   Period Weeks   Status Achieved     PT LONG TERM GOAL #3   Title wound on medial aspect of Lt 1st MTP to be healed   01/17/2017   Time 4   Period Weeks   Status New     PT LONG TERM GOAL #4   Title Pt to be wearing diabetic shoes as to not rub any more wounds on pt feet.   01/17/2017   Time 2   Period Days     PT LONG TERM GOAL #5   Title PT pain to be no greater than a 2/10 in his left foot   01/17/2017   Time 2   Period Weeks   Status New             Patient will benefit from skilled therapeutic intervention in order to improve the following deficits and impairments:     Visit Diagnosis: Diabetic ulcer of left midfoot associated with diabetes mellitus due to underlying condition, with muscle involvement without evidence of necrosis ([place])  Difficulty in walking, not elsewhere classified  Localized edema  Pain in  left foot     Problem List Patient Active Problem List   Diagnosis Date Noted  . Cellulitis of left foot 12/12/2016  . Nausea & vomiting 02/24/2016  . A-fib ([place]) 02/23/2016  . Hypothyroidism 02/23/2016  . CKD (chronic kidney disease), stage III 02/23/2016  . Anxiety 02/23/2016  . Chronic combined systolic (congestive) and diastolic (congestive) heart failure ([place]) 02/23/2016  . Dizziness 02/23/2016  . Open wound of right foot 02/23/2016  . Rectal bleeding 07/28/2015  . Chronic anticoagulation 07/28/2015  . PVD (peripheral vascular disease) ([place])   . S/P angioplasty 04/23/2015  . Peripheral arterial occlusive disease ([place]) 04/23/2015  . PAD (peripheral artery disease) ([place])   . Critical lower limb ischemia   . Toe ulcer, right ([place])   . Type II or unspecified type diabetes mellitus without mention of complication, uncontrolled 11/27/2013  . Acute respiratory failure with hypoxia ([place]) 11/26/2013  . CAP (community acquired pneumonia) 11/26/2013  . CHF exacerbation ([place]) 11/25/2013  . Cardiomyopathy, ischemic 06/13/2013  . At risk for sudden cardiac death 06-Jul-2013  . S/P cardiac catheterization, Rt & Lt heart cath 06/05/2013 07-06-13  . Acute on chronic combined systolic and diastolic HF (heart failure), NYHA class 3 ([place]) 06/04/2013  . Atrial flutter with rapid ventricular response ([place]) 06/02/2013  . Acute renal failure: Cr up to 1.8 06/02/2013  . Hyperkalemia 06/02/2013    Class: Acute  . LBBB (left bundle branch block) 06/01/2013  . Hypotension- secondary to AF and Diltiazem Rx 06/01/2013  . Obesity (BMI 30-39.9) 06/01/2013  . Acute on chronic diastolic heart failure - due to Afib AVR 06/01/2013  . Atrial fibrillation with RVR- converted with Amiodarone 05/31/2013  . Stroke- Lt brain 2003 04/08/2013  . Hemiplegia affecting right dominant side (  [place]) 04/08/2013  . Cataract 04/08/2013  . GERD (gastroesophageal reflux disease) 04/08/2013  . S/P CABG x 3 - 2010  04/08/2013  . S/P AVR-tissue, 2010 04/08/2013  . DM2 (diabetes mellitus, type 2) ([place]) 04/08/2013  . Dyslipidemia 04/08/2013  . SUBACROMIAL BURSITIS, LEFT 11/05/2009   Ihor Austin, LPTA; CBIS [phone]  Aldona Lento 01/31/2017, 9:09 AM  [place], [name], [address] Phone: [phone]   Fax:  [phone]  Name: DONTAY HARM MRN: [id number] Date of Birth: April 08, 1946

## 2017-02-02 ENCOUNTER — Ambulatory Visit (HOSPITAL_COMMUNITY): Payer: Medicare Other | Admitting: Physical Therapy

## 2017-02-02 DIAGNOSIS — M79672 Pain in left foot: Secondary | ICD-10-CM | POA: Diagnosis not present

## 2017-02-02 DIAGNOSIS — L97425 Non-pressure chronic ulcer of left heel and midfoot with muscle involvement without evidence of necrosis: Secondary | ICD-10-CM | POA: Diagnosis not present

## 2017-02-02 DIAGNOSIS — R262 Difficulty in walking, not elsewhere classified: Secondary | ICD-10-CM

## 2017-02-02 DIAGNOSIS — R600 Localized edema: Secondary | ICD-10-CM

## 2017-02-02 DIAGNOSIS — E08621 Diabetes mellitus due to underlying condition with foot ulcer: Secondary | ICD-10-CM

## 2017-02-02 NOTE — Therapy (Signed)
[place], [name], [address] Phone: [phone]   Fax:  [phone]  Wound Care Therapy  Patient Details  Name: [name] MRN: [id number] Date of Birth: 1946-04-13 Referring Provider: Nicholes Mango  Encounter Date: 02/02/2017      PT End of Session - 02/02/17 1116    Visit Number 13   Number of Visits 24   Date for PT Re-Evaluation 01/20/17   Authorization Type UHC medicare   Authorization - Visit Number 13   Authorization - Number of Visits 20   PT Start Time 0820   PT Stop Time 0840   PT Time Calculation (min) 20 min   Activity Tolerance Patient tolerated treatment well   Behavior During Therapy [facility] for tasks assessed/performed      Past Medical History:  Diagnosis Date  . At risk for sudden cardiac death 2013/06/15  . Cataract   . Chronic anticoagulation 05/2013   started  . Chronic kidney disease    Patient reports he is seeing Kidney doctor in September  . Coronary artery disease   . Diabetes mellitus    type 2   . Dyslipidemia   . GERD (gastroesophageal reflux disease)   . History of valve replacement 2010   [facility] Ease bioprosthetic 76mm  . Hypertension   . PAF (paroxysmal atrial fibrillation) ([place]) 05/2013   converted with amiodarone to SR  . S/P CABG x 3 2010  . S/P cardiac catheterization, Rt & Lt heart cath 06/05/2013 06/15/13  . Stroke [facility]) 2003   R-sided weakness & upper extremity swelling   . Thyroid disease     Past Surgical History:  Procedure Laterality Date  . AORTIC VALVE REPLACEMENT  01/26/2009   [facility] Ease bioprosthetic 62mm valve  . CARDIAC CATHETERIZATION  06/05/2013   native 3 vessel disease; patent LIMA to LAD, SVG to OM and SVG to PDA; Elevated right and left heart filling pressures, with predominantly pulmonary venous hypertension, normal PVR  . COLONOSCOPY  2007   Dr. Aviva Signs: internal hemorrhoids  . COLONOSCOPY N/A 06/16/2016    Procedure: COLONOSCOPY;  Surgeon: Rogene Houston, MD;  Location[facility] ENDO SUITE;  Service: Endoscopy;  Laterality: N/A;  1:45  . CORONARY ARTERY BYPASS GRAFT  01/26/2009   LIMA to LAD, SVG to circumflex, SVG to PDA  . GRAFT(S) ANGIOGRAM  06/05/2013   Procedure: GRAFT(S) Cyril Loosen;  Surgeon: Leonie Man, MD;  Location: [facility] CATH LAB;  Service: Cardiovascular;;  . IR GENERIC HISTORICAL  05/12/2016   IR RADIOLOGIST EVAL & MGMT 05/12/2016 Jacqulynn Cadet, MD [facility] INTERV RAD  . IR RADIOLOGIST EVAL & MGMT  01/04/2017  . LEFT AND RIGHT HEART CATHETERIZATION WITH CORONARY ANGIOGRAM N/A 06/05/2013   Procedure: LEFT AND RIGHT HEART CATHETERIZATION WITH CORONARY ANGIOGRAM;  Surgeon: Leonie Man, MD;  Location: [facility] CATH LAB;  Service: Cardiovascular;  Laterality: N/A;  . TRANSTHORACIC ECHOCARDIOGRAM  09/2011   grade 1 diastolic dysfunction; increasing valve gradient; calcified MV annulus   . TRANSTHORACIC ECHOCARDIOGRAM  06/03/2013   EF 25-30%, mod conc. hypertrophy, grade 3 diastolic dysfunction; LA mod dilated; calcified MV annulus; transaortic gradients are normal for the bioprosthetic valve; inf vena cava dilated (elevated CVP) - LifeVest    There were no vitals filed for this visit.                  Wound Therapy - 02/02/17 0923    Subjective Pt reports his foot continues  to hurt him.  States he feels like theirs rocks in it.   Patient and Family Stated Goals wound to heal    Date of Onset 09/15/16   Prior Treatments self care, antibiotics, IP hospitalization    Pain Assessment 0-10   Pain Score 3    Pain Type Acute pain   Pain Location Foot   Pain Orientation Left;Medial   Pain Descriptors / Indicators Sore   Pain Onset With Activity   Patients Stated Pain Goal 0   Pain Intervention(s) Elevated extremity   Multiple Pain Sites No   Evaluation and Treatment Procedures Explained to Patient/Family Yes   Evaluation and Treatment Procedures agreed to   Wound Properties Date  First Assessed: 01/17/17 Time First Assessed: 0814 Wound Type: Non-pressure wound   Dressing Type Silver dressings  acticoat, 2x2, medipore tape and gauze   Dressing Changed Changed   Dressing Status Intact   Dressing Change Frequency PRN   Site / Wound Assessment Painful;Red;Other (Comment)  Great toe edema present   % Wound base Red or Granulating 95%   % Wound base Yellow/Fibrinous Exudate 5%   Peri-wound Assessment Edema;Erythema (blanchable)  red halo decreased to 2.5cm in diameter, great toe swollen   Drainage Amount None   Drainage Description Serous   Treatment Cleansed;Debridement (Selective)   Selective Debridement - Location MTP  dead skin   Selective Debridement - Tools Used Forceps;Scissors   Selective Debridement - Tissue Removed dead skin minimal slough    Wound Therapy - Clinical Statement No drainage present this session and nothing to debride in general area of medial MTP but callous/dry skin debrided perimeter.  Redness and swlelling continues to be present, however not as bad a previously.  continued to dress with silver and medipore.   Wound Therapy - Functional Problem List Pt needs to stay off his foot as much as possible.  The therapis tried a [employer] shoe but pt was unable to balance and was a risk fall when using.    Factors Delaying/Impairing Wound Healing Altered sensation;Diabetes Mellitus;Infection - systemic/local;Immobility;Multiple medical problems;Polypharmacy;Vascular compromise;Other (comment)   Hydrotherapy Plan Debridement;Dressing change;Patient/family education;Pulsatile lavage with suction   Wound Therapy - Frequency --  2x/ a week for 6 weeks   Wound Therapy - Current Recommendations PT   Wound Plan Assess wound.  may need to return to MD if nothing to debride/no drainage or wound present next session.    Dressing  vaseline, acticoat, 2x2 and medipore.                   PT Short Term Goals - 01/17/17 4098      PT SHORT TERM GOAL #1    Title Pt wound depth to be no greater than .6   Time 3   Period Weeks   Status Achieved     PT SHORT TERM GOAL #2   Title Callous around Lt plantar wound to be gone to decrease pressure placed on wound when walking    Time 3   Period Weeks   Status Achieved     PT SHORT TERM GOAL #3   Title Redness and edema along LT 1st MTP to be gone to demonstrate the absence of infection.    Time 3   Period Weeks   Status On-going     PT SHORT TERM GOAL #4   Title Pt to verbalize the importance of keeping his sugar levels around 100 to allow healing of wound.   Time 3  Period Weeks   Status Achieved           PT Long Term Goals - 01/17/17 2585      PT LONG TERM GOAL #1   Title Wound depth to be no greater than .2 to allow pt to feel comfortable with self treatment.    Time 6   Period Weeks   Status Achieved     PT LONG TERM GOAL #2   Title Wound on Lt plantar aspect to have no undermining to allow pt to feel cmforable with self treatment.    Time 6   Period Weeks   Status Achieved     PT LONG TERM GOAL #3   Title wound on medial aspect of Lt 1st MTP to be healed   01/17/2017   Time 4   Period Weeks   Status New     PT LONG TERM GOAL #4   Title Pt to be wearing diabetic shoes as to not rub any more wounds on pt feet.   01/17/2017   Time 2   Period Days     PT LONG TERM GOAL #5   Title PT pain to be no greater than a 2/10 in his left foot   01/17/2017   Time 2   Period Weeks   Status New             Patient will benefit from skilled therapeutic intervention in order to improve the following deficits and impairments:     Visit Diagnosis: Diabetic ulcer of left midfoot associated with diabetes mellitus due to underlying condition, with muscle involvement without evidence of necrosis ([place])  Difficulty in walking, not elsewhere classified  Localized edema  Pain in left foot     Problem List Patient Active Problem List   Diagnosis Date Noted  .  Cellulitis of left foot 12/12/2016  . Nausea & vomiting 02/24/2016  . A-fib ([place]) 02/23/2016  . Hypothyroidism 02/23/2016  . CKD (chronic kidney disease), stage III 02/23/2016  . Anxiety 02/23/2016  . Chronic combined systolic (congestive) and diastolic (congestive) heart failure ([place]) 02/23/2016  . Dizziness 02/23/2016  . Open wound of right foot 02/23/2016  . Rectal bleeding 07/28/2015  . Chronic anticoagulation 07/28/2015  . PVD (peripheral vascular disease) ([place])   . S/P angioplasty 04/23/2015  . Peripheral arterial occlusive disease ([place]) 04/23/2015  . PAD (peripheral artery disease) ([place])   . Critical lower limb ischemia   . Toe ulcer, right ([place])   . Type II or unspecified type diabetes mellitus without mention of complication, uncontrolled 11/27/2013  . Acute respiratory failure with hypoxia ([name]) 11/26/2013  . CAP (community acquired pneumonia) 11/26/2013  . CHF exacerbation ([place]) 11/25/2013  . Cardiomyopathy, ischemic 06/13/2013  . At risk for sudden cardiac death 2013/06/09  . S/P cardiac catheterization, Rt & Lt heart cath 06/05/2013 Jun 09, 2013  . Acute on chronic combined systolic and diastolic HF (heart failure), NYHA class 3 ([place]) 06/04/2013  . Atrial flutter with rapid ventricular response ([place]) 06/02/2013  . Acute renal failure: Cr up to 1.8 06/02/2013  . Hyperkalemia 06/02/2013    Class: Acute  . LBBB (left bundle branch block) 06/01/2013  . Hypotension- secondary to AF and Diltiazem Rx 06/01/2013  . Obesity (BMI 30-39.9) 06/01/2013  . Acute on chronic diastolic heart failure - due to Afib AVR 06/01/2013  . Atrial fibrillation with RVR- converted with Amiodarone 05/31/2013  . Stroke- Lt brain 2003 04/08/2013  . Hemiplegia affecting right dominant side ([place]) 04/08/2013  .  Cataract 04/08/2013  . GERD (gastroesophageal reflux disease) 04/08/2013  . S/P CABG x 3 - 2010 04/08/2013  . S/P AVR-tissue, 2010 04/08/2013  . DM2 (diabetes mellitus, type 2) ([place])  04/08/2013  . Dyslipidemia 04/08/2013  . SUBACROMIAL BURSITIS, LEFT 11/05/2009    Teena Irani, PTA/CLT [phone]  02/02/2017, 11:17 AM  [place], [name], [address] Phone: [phone]   Fax:  [phone]  Name: BRONX BROGDEN MRN: [id number] Date of Birth: 1946-06-26

## 2017-02-06 ENCOUNTER — Other Ambulatory Visit: Payer: Self-pay | Admitting: *Deleted

## 2017-02-06 ENCOUNTER — Telehealth: Payer: Self-pay | Admitting: *Deleted

## 2017-02-06 NOTE — Patient Outreach (Signed)
[place] [facility]) Care Management  02/06/2017  [name] Apr 12, 1946 [id number]   Care coordination   [facility] Cm with assist of Ena Dawley [facility] ordered a catalog to be sent to pt home Glucometers are not available in [place] first [place] product benefit [place] CM will check to see if a glucometer is available via united health care pharmacy benefit or possible assist from [place] staff Pt to be seen for follow visit a MD office this week  Joelene Millin L. Lavina Hamman, RN, BSN, [place] Care Management [phone]

## 2017-02-06 NOTE — Patient Outreach (Signed)
[facility]) Care Management  02/06/2017  [name] 1946/02/25 [id number]   Care coordination  [facility] CM spoke with [name] to remind him of his appointments on 02/09/17 to [facility] and IR RAD Reports his niece was suppose to get a call about his RAD appointment and he would call her to follow up.  Reports she would be taking him to the RAD appt if it is in [place]  He reports pain of his left foot today at a level of a 5 and is resting at home after taking pain medications  Continues to give permission for glucometer ordering  Plans to see [name] for MD office visit this week  Joelene Millin L. Lavina Hamman, RN, BSN, [place] Care Management [phone]

## 2017-02-07 ENCOUNTER — Ambulatory Visit (HOSPITAL_COMMUNITY): Payer: Medicare Other | Admitting: Physical Therapy

## 2017-02-07 ENCOUNTER — Inpatient Hospital Stay (HOSPITAL_COMMUNITY): Payer: Medicare Other

## 2017-02-07 ENCOUNTER — Inpatient Hospital Stay (HOSPITAL_COMMUNITY)
Admission: AD | Admit: 2017-02-07 | Discharge: 2017-02-14 | DRG: 629 | Disposition: A | Discharge status: Home / Self Care | Payer: Medicare Other | Source: Ambulatory Visit | Attending: Family Medicine | Admitting: Family Medicine

## 2017-02-07 ENCOUNTER — Encounter (HOSPITAL_COMMUNITY): Payer: Self-pay | Admitting: *Deleted

## 2017-02-07 DIAGNOSIS — Z951 Presence of aortocoronary bypass graft: Secondary | ICD-10-CM | POA: Diagnosis not present

## 2017-02-07 DIAGNOSIS — E1140 Type 2 diabetes mellitus with diabetic neuropathy, unspecified: Secondary | ICD-10-CM | POA: Diagnosis not present

## 2017-02-07 DIAGNOSIS — Z87891 Personal history of nicotine dependence: Secondary | ICD-10-CM | POA: Diagnosis not present

## 2017-02-07 DIAGNOSIS — Z1389 Encounter for screening for other disorder: Secondary | ICD-10-CM | POA: Diagnosis not present

## 2017-02-07 DIAGNOSIS — L97425 Non-pressure chronic ulcer of left heel and midfoot with muscle involvement without evidence of necrosis: Principal | ICD-10-CM

## 2017-02-07 DIAGNOSIS — M898X7 Other specified disorders of bone, ankle and foot: Secondary | ICD-10-CM | POA: Diagnosis not present

## 2017-02-07 DIAGNOSIS — L03116 Cellulitis of left lower limb: Secondary | ICD-10-CM | POA: Diagnosis not present

## 2017-02-07 DIAGNOSIS — I5032 Chronic diastolic (congestive) heart failure: Secondary | ICD-10-CM | POA: Diagnosis present

## 2017-02-07 DIAGNOSIS — I130 Hypertensive heart and chronic kidney disease with heart failure and stage 1 through stage 4 chronic kidney disease, or unspecified chronic kidney disease: Secondary | ICD-10-CM | POA: Diagnosis present

## 2017-02-07 DIAGNOSIS — Z7901 Long term (current) use of anticoagulants: Secondary | ICD-10-CM

## 2017-02-07 DIAGNOSIS — E11628 Type 2 diabetes mellitus with other skin complications: Secondary | ICD-10-CM | POA: Diagnosis not present

## 2017-02-07 DIAGNOSIS — E1165 Type 2 diabetes mellitus with hyperglycemia: Secondary | ICD-10-CM

## 2017-02-07 DIAGNOSIS — R262 Difficulty in walking, not elsewhere classified: Secondary | ICD-10-CM

## 2017-02-07 DIAGNOSIS — Z952 Presence of prosthetic heart valve: Secondary | ICD-10-CM | POA: Diagnosis not present

## 2017-02-07 DIAGNOSIS — I739 Peripheral vascular disease, unspecified: Secondary | ICD-10-CM | POA: Diagnosis present

## 2017-02-07 DIAGNOSIS — R600 Localized edema: Secondary | ICD-10-CM

## 2017-02-07 DIAGNOSIS — E1122 Type 2 diabetes mellitus with diabetic chronic kidney disease: Secondary | ICD-10-CM | POA: Diagnosis present

## 2017-02-07 DIAGNOSIS — L0390 Cellulitis, unspecified: Secondary | ICD-10-CM | POA: Diagnosis not present

## 2017-02-07 DIAGNOSIS — M00872 Arthritis due to other bacteria, left ankle and foot: Secondary | ICD-10-CM | POA: Diagnosis not present

## 2017-02-07 DIAGNOSIS — N183 Chronic kidney disease, stage 3 unspecified: Secondary | ICD-10-CM | POA: Diagnosis present

## 2017-02-07 DIAGNOSIS — Z79899 Other long term (current) drug therapy: Secondary | ICD-10-CM

## 2017-02-07 DIAGNOSIS — Z794 Long term (current) use of insulin: Secondary | ICD-10-CM

## 2017-02-07 DIAGNOSIS — E039 Hypothyroidism, unspecified: Secondary | ICD-10-CM | POA: Diagnosis not present

## 2017-02-07 DIAGNOSIS — M79672 Pain in left foot: Secondary | ICD-10-CM

## 2017-02-07 DIAGNOSIS — I129 Hypertensive chronic kidney disease with stage 1 through stage 4 chronic kidney disease, or unspecified chronic kidney disease: Secondary | ICD-10-CM | POA: Diagnosis not present

## 2017-02-07 DIAGNOSIS — E669 Obesity, unspecified: Secondary | ICD-10-CM | POA: Diagnosis present

## 2017-02-07 DIAGNOSIS — E1151 Type 2 diabetes mellitus with diabetic peripheral angiopathy without gangrene: Secondary | ICD-10-CM | POA: Diagnosis not present

## 2017-02-07 DIAGNOSIS — E08621 Diabetes mellitus due to underlying condition with foot ulcer: Secondary | ICD-10-CM | POA: Diagnosis not present

## 2017-02-07 DIAGNOSIS — S91302A Unspecified open wound, left foot, initial encounter: Secondary | ICD-10-CM | POA: Diagnosis not present

## 2017-02-07 DIAGNOSIS — I779 Disorder of arteries and arterioles, unspecified: Secondary | ICD-10-CM | POA: Diagnosis not present

## 2017-02-07 DIAGNOSIS — E785 Hyperlipidemia, unspecified: Secondary | ICD-10-CM | POA: Diagnosis present

## 2017-02-07 DIAGNOSIS — B9689 Other specified bacterial agents as the cause of diseases classified elsewhere: Secondary | ICD-10-CM | POA: Diagnosis not present

## 2017-02-07 DIAGNOSIS — I480 Paroxysmal atrial fibrillation: Secondary | ICD-10-CM | POA: Diagnosis not present

## 2017-02-07 DIAGNOSIS — I4891 Unspecified atrial fibrillation: Secondary | ICD-10-CM | POA: Diagnosis not present

## 2017-02-07 DIAGNOSIS — I70798 Other atherosclerosis of other type of bypass graft(s) of the extremities, other extremity: Secondary | ICD-10-CM | POA: Diagnosis not present

## 2017-02-07 DIAGNOSIS — Z6834 Body mass index (BMI) 34.0-34.9, adult: Secondary | ICD-10-CM

## 2017-02-07 DIAGNOSIS — L089 Local infection of the skin and subcutaneous tissue, unspecified: Secondary | ICD-10-CM | POA: Diagnosis not present

## 2017-02-07 DIAGNOSIS — I2510 Atherosclerotic heart disease of native coronary artery without angina pectoris: Secondary | ICD-10-CM | POA: Diagnosis present

## 2017-02-07 DIAGNOSIS — M86071 Acute hematogenous osteomyelitis, right ankle and foot: Secondary | ICD-10-CM | POA: Diagnosis not present

## 2017-02-07 DIAGNOSIS — E1169 Type 2 diabetes mellitus with other specified complication: Secondary | ICD-10-CM | POA: Diagnosis not present

## 2017-02-07 DIAGNOSIS — M86172 Other acute osteomyelitis, left ankle and foot: Secondary | ICD-10-CM | POA: Diagnosis not present

## 2017-02-07 DIAGNOSIS — M869 Osteomyelitis, unspecified: Secondary | ICD-10-CM | POA: Diagnosis not present

## 2017-02-07 LAB — CBC
HEMATOCRIT: 36.7 % — AB (ref 39.0–52.0)
HEMOGLOBIN: 11.9 g/dL — AB (ref 13.0–17.0)
MCH: 28.5 pg (ref 26.0–34.0)
MCHC: 32.4 g/dL (ref 30.0–36.0)
MCV: 88 fL (ref 78.0–100.0)
Platelets: 252 10*3/uL (ref 150–400)
RBC: 4.17 MIL/uL — AB (ref 4.22–5.81)
RDW: 13.7 % (ref 11.5–15.5)
WBC: 7.4 10*3/uL (ref 4.0–10.5)

## 2017-02-07 LAB — BASIC METABOLIC PANEL
ANION GAP: 10 (ref 5–15)
BUN: 24 mg/dL — ABNORMAL HIGH (ref 6–20)
CHLORIDE: 100 mmol/L — AB (ref 101–111)
CO2: 26 mmol/L (ref 22–32)
CREATININE: 1.58 mg/dL — AB (ref 0.61–1.24)
Calcium: 9.1 mg/dL (ref 8.9–10.3)
GFR calc non Af Amer: 43 mL/min — ABNORMAL LOW (ref >60)
GFR, EST AFRICAN AMERICAN: 50 mL/min — AB (ref >60)
Glucose, Bld: 220 mg/dL — ABNORMAL HIGH (ref 65–99)
POTASSIUM: 4.1 mmol/L (ref 3.5–5.1)
Sodium: 136 mmol/L (ref 135–145)

## 2017-02-07 LAB — GLUCOSE, CAPILLARY
GLUCOSE-CAPILLARY: 244 mg/dL — AB (ref 65–99)
Glucose-Capillary: 190 mg/dL — ABNORMAL HIGH (ref 65–99)

## 2017-02-07 LAB — SEDIMENTATION RATE: Sed Rate: 63 mm/hr — ABNORMAL HIGH (ref 0–16)

## 2017-02-07 LAB — C-REACTIVE PROTEIN: CRP: 2.1 mg/dL — ABNORMAL HIGH (ref <1.0)

## 2017-02-07 MED ORDER — ONDANSETRON HCL 4 MG/2ML IJ SOLN
4.0000 mg | Freq: Four times a day (QID) | INTRAMUSCULAR | Status: DC | PRN
  Filled 2017-02-07: qty 2

## 2017-02-07 MED ORDER — OXYCODONE-ACETAMINOPHEN 5-325 MG PO TABS
1.0000 | ORAL_TABLET | Freq: Four times a day (QID) | ORAL | Status: DC | PRN
  Administered 2017-02-10: 1 via ORAL
  Filled 2017-02-07: qty 1

## 2017-02-07 MED ORDER — ATORVASTATIN CALCIUM 20 MG PO TABS
20.0000 mg | ORAL_TABLET | Freq: Every day | ORAL | Status: DC
  Administered 2017-02-08 – 2017-02-13 (×6): 20 mg via ORAL
  Filled 2017-02-07 (×6): qty 1

## 2017-02-07 MED ORDER — MECLIZINE HCL 25 MG PO TABS
25.0000 mg | ORAL_TABLET | Freq: Three times a day (TID) | ORAL | Status: DC | PRN
  Filled 2017-02-07: qty 1

## 2017-02-07 MED ORDER — ACETAMINOPHEN 500 MG PO TABS: 1000.0000 mg | ORAL_TABLET | Freq: Two times a day (BID) | ORAL | Status: DC | PRN

## 2017-02-07 MED ORDER — LEVOTHYROXINE SODIUM 25 MCG PO TABS
25.0000 ug | ORAL_TABLET | Freq: Every day | ORAL | Status: DC
  Administered 2017-02-08 – 2017-02-14 (×7): 25 ug via ORAL
  Filled 2017-02-07 (×7): qty 1

## 2017-02-07 MED ORDER — APIXABAN 5 MG PO TABS
5.0000 mg | ORAL_TABLET | Freq: Two times a day (BID) | ORAL | Status: DC
  Administered 2017-02-07 – 2017-02-08 (×2): 5 mg via ORAL
  Filled 2017-02-07 (×2): qty 1

## 2017-02-07 MED ORDER — ONDANSETRON HCL 4 MG PO TABS: 4.0000 mg | ORAL_TABLET | Freq: Four times a day (QID) | ORAL | Status: DC | PRN

## 2017-02-07 MED ORDER — OMEGA-3-ACID ETHYL ESTERS 1 G PO CAPS
1.0000 g | ORAL_CAPSULE | Freq: Every day | ORAL | Status: DC
  Administered 2017-02-08 – 2017-02-14 (×6): 1 g via ORAL
  Filled 2017-02-07 (×6): qty 1

## 2017-02-07 MED ORDER — VANCOMYCIN HCL IN DEXTROSE 1-5 GM/200ML-% IV SOLN
1000.0000 mg | Freq: Two times a day (BID) | INTRAVENOUS | Status: DC
  Administered 2017-02-08 – 2017-02-13 (×10): 1000 mg via INTRAVENOUS
  Filled 2017-02-07 (×12): qty 200

## 2017-02-07 MED ORDER — DEXTROSE 5 % IV SOLN
2.0000 g | INTRAVENOUS | Status: DC
  Administered 2017-02-08 – 2017-02-09 (×2): 2 g via INTRAVENOUS
  Filled 2017-02-07 (×3): qty 2

## 2017-02-07 MED ORDER — FENOFIBRATE 160 MG PO TABS
160.0000 mg | ORAL_TABLET | Freq: Every day | ORAL | Status: DC
  Administered 2017-02-08 – 2017-02-14 (×7): 160 mg via ORAL
  Filled 2017-02-07 (×7): qty 1

## 2017-02-07 MED ORDER — VANCOMYCIN HCL 10 G IV SOLR
1500.0000 mg | Freq: Once | INTRAVENOUS | Status: AC
  Administered 2017-02-07: 1500 mg via INTRAVENOUS
  Filled 2017-02-07: qty 1500

## 2017-02-07 MED ORDER — TORSEMIDE 20 MG PO TABS
10.0000 mg | ORAL_TABLET | Freq: Every day | ORAL | Status: DC
  Administered 2017-02-08: 10 mg via ORAL
  Administered 2017-02-09: 20 mg via ORAL
  Administered 2017-02-11 – 2017-02-14 (×4): 10 mg via ORAL
  Filled 2017-02-07 (×6): qty 1

## 2017-02-07 MED ORDER — PANTOPRAZOLE SODIUM 40 MG PO TBEC
40.0000 mg | DELAYED_RELEASE_TABLET | Freq: Two times a day (BID) | ORAL | Status: DC
  Administered 2017-02-07 – 2017-02-14 (×13): 40 mg via ORAL
  Filled 2017-02-07 (×13): qty 1

## 2017-02-07 MED ORDER — INSULIN ASPART 100 UNIT/ML ~~LOC~~ SOLN
0.0000 [IU] | Freq: Every day | SUBCUTANEOUS | Status: DC
  Administered 2017-02-07: 2 [IU] via SUBCUTANEOUS
  Administered 2017-02-08 – 2017-02-10 (×3): 3 [IU] via SUBCUTANEOUS
  Administered 2017-02-11: 5 [IU] via SUBCUTANEOUS
  Administered 2017-02-12 – 2017-02-13 (×2): 4 [IU] via SUBCUTANEOUS

## 2017-02-07 MED ORDER — FERROUS SULFATE 325 (65 FE) MG PO TBEC
325.0000 mg | DELAYED_RELEASE_TABLET | Freq: Every day | ORAL | Status: DC
  Administered 2017-02-08 – 2017-02-14 (×6): 325 mg via ORAL
  Filled 2017-02-07 (×9): qty 1

## 2017-02-07 MED ORDER — ALPRAZOLAM 0.5 MG PO TABS
1.0000 mg | ORAL_TABLET | Freq: Three times a day (TID) | ORAL | Status: DC
  Administered 2017-02-07 – 2017-02-14 (×18): 1 mg via ORAL
  Filled 2017-02-07: qty 2
  Filled 2017-02-07: qty 1
  Filled 2017-02-07 (×3): qty 2
  Filled 2017-02-07: qty 1
  Filled 2017-02-07 (×3): qty 2
  Filled 2017-02-07: qty 1
  Filled 2017-02-07 (×3): qty 2
  Filled 2017-02-07: qty 1
  Filled 2017-02-07 (×2): qty 2
  Filled 2017-02-07 (×2): qty 1

## 2017-02-07 MED ORDER — INSULIN GLARGINE 100 UNIT/ML ~~LOC~~ SOLN
35.0000 [IU] | Freq: Every day | SUBCUTANEOUS | Status: DC
  Administered 2017-02-07: 35 [IU] via SUBCUTANEOUS
  Filled 2017-02-07 (×2): qty 0.35

## 2017-02-07 MED ORDER — INSULIN ASPART 100 UNIT/ML ~~LOC~~ SOLN
0.0000 [IU] | Freq: Three times a day (TID) | SUBCUTANEOUS | Status: DC
  Administered 2017-02-07: 4 [IU] via SUBCUTANEOUS
  Administered 2017-02-08: 11 [IU] via SUBCUTANEOUS
  Administered 2017-02-08 – 2017-02-09 (×4): 7 [IU] via SUBCUTANEOUS
  Administered 2017-02-09: 11 [IU] via SUBCUTANEOUS
  Administered 2017-02-10 (×2): 4 [IU] via SUBCUTANEOUS
  Administered 2017-02-10: 15 [IU] via SUBCUTANEOUS
  Administered 2017-02-11: 11 [IU] via SUBCUTANEOUS
  Administered 2017-02-11 – 2017-02-12 (×4): 15 [IU] via SUBCUTANEOUS
  Administered 2017-02-12: 11 [IU] via SUBCUTANEOUS
  Administered 2017-02-13: 15 [IU] via SUBCUTANEOUS
  Administered 2017-02-13: 11 [IU] via SUBCUTANEOUS
  Administered 2017-02-13: 15 [IU] via SUBCUTANEOUS
  Administered 2017-02-14 (×2): 11 [IU] via SUBCUTANEOUS

## 2017-02-07 MED ORDER — AMIODARONE HCL 200 MG PO TABS
200.0000 mg | ORAL_TABLET | Freq: Every day | ORAL | Status: DC
  Administered 2017-02-08 – 2017-02-14 (×7): 200 mg via ORAL
  Filled 2017-02-07 (×7): qty 1

## 2017-02-07 MED ORDER — DOCUSATE SODIUM 100 MG PO CAPS
100.0000 mg | ORAL_CAPSULE | Freq: Two times a day (BID) | ORAL | Status: DC
  Administered 2017-02-07 – 2017-02-14 (×13): 100 mg via ORAL
  Filled 2017-02-07 (×13): qty 1

## 2017-02-07 MED ORDER — GABAPENTIN 300 MG PO CAPS
300.0000 mg | ORAL_CAPSULE | Freq: Two times a day (BID) | ORAL | Status: DC
  Administered 2017-02-07 – 2017-02-14 (×13): 300 mg via ORAL
  Filled 2017-02-07 (×13): qty 1

## 2017-02-07 MED ORDER — ACETAMINOPHEN 325 MG PO TABS: 650.0000 mg | ORAL_TABLET | Freq: Four times a day (QID) | ORAL | Status: DC | PRN

## 2017-02-07 MED ORDER — DEXTROSE 5 % IV SOLN
2.0000 g | Freq: Once | INTRAVENOUS | Status: AC
  Administered 2017-02-07: 2 g via INTRAVENOUS
  Filled 2017-02-07: qty 2

## 2017-02-07 MED ORDER — METOPROLOL SUCCINATE ER 25 MG PO TB24
25.0000 mg | ORAL_TABLET | Freq: Every day | ORAL | Status: DC
  Administered 2017-02-08 – 2017-02-14 (×7): 25 mg via ORAL
  Filled 2017-02-07 (×7): qty 1

## 2017-02-07 MED ORDER — ACETAMINOPHEN 650 MG RE SUPP: 650.0000 mg | Freq: Four times a day (QID) | RECTAL | Status: DC | PRN

## 2017-02-07 NOTE — Therapy (Signed)
[place], [name], [address] Phone: [phone]   Fax:  [phone]  Wound Care Therapy/Discharge   Patient Details  Name: [name] MRN: [id number] Date of Birth: [birth year] Referring Provider: Nicholes Mango  Encounter Date: 02/07/2017      PT End of Session - 02/07/17 1313    Visit Number 14   Number of Visits 14   Date for PT Re-Evaluation 01/20/17   Authorization Type UHC medicare   Authorization - Visit Number 14   Authorization - Number of Visits 14   PT Start Time 0903   PT Stop Time 0930   PT Time Calculation (min) 27 min   Activity Tolerance Patient tolerated treatment well   Behavior During Therapy [facility] for tasks assessed/performed      Past Medical History:  Diagnosis Date  . At risk for sudden cardiac death June 20, 2013  . Cataract   . Chronic anticoagulation 05/2013   started  . Chronic kidney disease    Patient reports he is seeing Kidney doctor in September  . Coronary artery disease   . Diabetes mellitus    type 2   . Dyslipidemia   . GERD (gastroesophageal reflux disease)   . History of valve replacement 2010   [facility] Ease bioprosthetic 109m  . Hypertension   . PAF [place]ox[place]) [place] 05[place]2013   converted with amiodarone to SR  . S/P CABG x 3 2010  . S/P cardiac catheterization, Rt & Lt heart cath 06/05/2013 12014-11-06 . Stroke ([facility] 2003   R-sided weakness & upper extremity swelling   . Thyroid disease     Past Surgical History:  Procedure Laterality Date  . AORTIC [facility]VE [facility]  [facility]/2009   [facility]  . CARDIAC CATHETERIZATION  06/05/2013   native 3 vessel disease; patent LIMA to LAD, SVG to OM and SVG to PDA; Elevated right and left heart filling pressures, with predominantly pulmonary venous hypertension, normal PVR  . COLONOSCOPY  2007   Dr. MaAviva Signsinternal hemorrhoids  . COLONOSCOPY N/A  06/16/2016   Procedure: COLONOSCOPY;  Surgeon: NaRogene HoustonMD;  Location[facility];  Service: Endoscopy;  Laterality: N/A;  1:45  . CORONARY ARTERY BYPASS GRAFT  01/26/2009   LIMA to LAD, SVG to circumflex, SVG to PDA  . GRAFT(S) ANGIOGRAM  06/05/2013   Procedure: GRAFT(S) ANCyril Loosen Surgeon: DaLeonie ManMD;  [facility];  Service: Cardiovascular;;  . IR GENERIC HISTORICAL  05/12/2016   IR RADIOLOGIST EVAL & MGMT 05/12/2016 HeJacqulynn CadetMD [facility] INTERV RAD  . IR RADIOLOGIST EVAL & MGMT  01/04/2017  . LEFT AND RIGHT HEART CATHETERIZATION WITH CORONARY ANGIOGRAM N/A 06/05/2013   Procedure: LEFT AND RIGHT HEART CATHETERIZATION WITH CORONARY ANGIOGRAM;  Surgeon: DaLeonie ManMD;  [facility];  Service: Cardiovascular;  Laterality: N/A;  . TRANSTHORACIC ECHOCARDIOGRAM  09/2011   grade 1 diastolic dysfunction; increasing valve gradient; calcified MV annulus   . TRANSTHORACIC ECHOCARDIOGRAM  06/03/2013   EF 25-30%, mod conc. hypertrophy, grade 3 diastolic dysfunction; LA mod dilated; calcified MV annulus; transaortic gradients are normal for the bioprosthetic valve; inf vena cava dilated (elevated CVP) - LifeVest    There were no vitals filed for this visit.                  Wound Therapy - 02/07/17 1027    Subjective Pt states that he  has been trying to stay off of his foot.    Patient and Family Stated Goals wound to heal    Date of Onset 09/15/16   Prior Treatments self care    Pain Assessment 0-10   Pain Score 5    Pain Type Chronic pain   Pain Location Foot   Pain Orientation Left   Pain Descriptors / Indicators Aching   Wound Properties Date First Assessed: 01/17/17 Time First Assessed: 0814 Wound Type: Non-pressure wound   Wound Therapy - Clinical Statement Pt wounds are healed but lateral aspect of 1st MTP continues to be purple and mushy with loss of integrity of structures underneath.  Therapist called MD's office concerning the  possible need for an x-ray.  MD would like to see pt today.  Explained to pt that there is no open area to be treated but that therapist is concern about the feel and look of his foot.  Explained that the MD would like to see him.  All further appointments at physical therapy will be cancelled due to no wound treatment needing to be completed.     Hydrotherapy Plan --  Discharge pt.  There is no open wound.    Wound Plan discharge pt .    Dressing  none                    PT Short Term Goals - 02/07/17 1315      PT SHORT TERM GOAL #1   Title Pt wound depth to be no greater than .6   Time 3   Period Weeks   Status Achieved     PT SHORT TERM GOAL #2   Title Callous around Lt plantar wound to be gone to decrease pressure placed on wound when walking    Time 3   Period Weeks   Status Achieved     PT SHORT TERM GOAL #3   Title Redness and edema along LT 1st MTP to be gone to demonstrate the absence of infection.    Time 3   Period Weeks   Status On-going     PT SHORT TERM GOAL #4   Title Pt to verbalize the importance of keeping his sugar levels around 100 to allow healing of wound.   Time 3   Period Weeks   Status Achieved           PT Long Term Goals - 02/07/17 1315      PT LONG TERM GOAL #1   Title Wound depth to be no greater than .2 to allow pt to feel comfortable with self treatment.    Time 6   Period Weeks   Status Achieved     PT LONG TERM GOAL #2   Title Wound on Lt plantar aspect to have no undermining to allow pt to feel cmforable with self treatment.    Time 6   Period Weeks   Status Achieved     PT LONG TERM GOAL #3   Title wound on medial aspect of Lt 1st MTP to be healed   01/17/2017   Baseline no wound but skin is purple and structure below feel as if they have lost integrity.    Time 4   Period Weeks   Status Achieved     PT LONG TERM GOAL #4   Title Pt to be wearing diabetic shoes as to not rub any more wounds on pt feet.    01/17/2017   Time 2  Period Days   Status Achieved     PT LONG TERM GOAL #5   Title PT pain to be no greater than a 2/10 in his left foot   01/17/2017   Time 2   Period Weeks   Status On-going               Plan - 2017/03/07 1314    Clinical Impression Statement as above    Rehab Potential Good   PT Frequency 2x / week   PT Duration 12 weeks   PT Treatment/Interventions ADLs/Self Care Home Management;Other (comment)  debridement with dressing changes    PT Next Visit Plan Discharge physical therapy.  Pt sent to MD to assess foot for possible x-ray    Consulted and Agree with Plan of Care Patient      Patient will benefit from skilled therapeutic intervention in order to improve the following deficits and impairments:  Abnormal gait, Increased edema, Pain, Other (comment) (nonhealing wound )  Visit Diagnosis: Diabetic ulcer of left midfoot associated with diabetes mellitus due to underlying condition, with muscle involvement without evidence of necrosis ([place])  Difficulty in walking, not elsewhere classified  Localized edema  Pain in left foot      G-Codes - Mar 07, 2017 1317    Functional Limitation Other PT primary   Other PT Primary Goal Status (Q2595) At least 1 percent but less than 20 percent impaired, limited or restricted   Other PT Primary Discharge Status (G3875) At least 1 percent but less than 20 percent impaired, limited or restricted       Problem List Patient Active Problem List   Diagnosis Date Noted  . Cellulitis of left foot 12/12/2016  . Nausea & vomiting 02/24/2016  . A-fib ([place]) 02/23/2016  . Hypothyroidism 02/23/2016  . CKD (chronic kidney disease), stage III 02/23/2016  . Anxiety 02/23/2016  . Chronic combined systolic (congestive) and diastolic (congestive) heart failure ([place]) 02/23/2016  . Dizziness 02/23/2016  . Open wound of right foot 02/23/2016  . Rectal bleeding 07/28/2015  . Chronic anticoagulation 07/28/2015  . PVD (peripheral  vascular disease) ([place])   . S/P angioplasty 04/23/2015  . Peripheral arterial occlusive disease ([place]) 04/23/2015  . PAD (peripheral artery disease) ([place])   . Critical lower limb ischemia   . Toe ulcer, right ([place])   . Type II or unspecified type diabetes mellitus without mention of complication, uncontrolled 11/27/2013  . Acute respiratory failure with hypoxia ([place]) 11/26/2013  . CAP (community acquired pneumonia) 11/26/2013  . CHF exacerbation ([place]) 11/25/2013  . Cardiomyopathy, ischemic 06/13/2013  . At risk for sudden cardiac death 07-05-2013  . S/P cardiac catheterization, Rt & Lt heart cath 06/05/2013 07/05/13  . Acute on chronic combined systolic and diastolic HF (heart failure), NYHA class 3 ([place]) 06/04/2013  . Atrial flutter with rapid ventricular response ([place]) 06/02/2013  . Acute renal failure: Cr up to 1.8 06/02/2013  . Hyperkalemia 06/02/2013    Class: Acute  . LBBB (left bundle branch block) 06/01/2013  . Hypotension- secondary to AF and Diltiazem Rx 06/01/2013  . Obesity (BMI 30-39.9) 06/01/2013  . Acute on chronic diastolic heart failure - due to Afib AVR 06/01/2013  . Atrial fibrillation with RVR- converted with Amiodarone 05/31/2013  . Stroke- Lt brain 2003 04/08/2013  . Hemiplegia affecting right dominant side ([place]) 04/08/2013  . Cataract 04/08/2013  . GERD (gastroesophageal reflux disease) 04/08/2013  . S/P CABG x 3 - 2010 04/08/2013  . S/P AVR-tissue, 2010 04/08/2013  . DM2 (  diabetes mellitus, type 2) ([place]) 04/08/2013  . Dyslipidemia 04/08/2013  . SUBACROMIAL BURSITIS, LEFT 11/05/2009    Rayetta Humphrey, PT CLT [phone] 02/07/2017, 1:17 PM  [place], [name], [address] Phone: [phone]   Fax:  [phone]  Name: [name] MRN: [id number] Date of Birth: 09/09/1945  PHYSICAL THERAPY DISCHARGE SUMMARY  Visits from Start of Care: 14  Current functional level related to  goals / functional outcomes: See above Remaining deficits: See above   Education / Equipment: See above Plan: Patient agrees to discharge.  Patient goals were partially met. Patient is being discharged due to meeting the stated rehab goals.  ?????       See above>  There is not longer a wound but there is possible infection.  Pt referred back to MD>   Rayetta Humphrey, [place] CLT [phone]

## 2017-02-07 NOTE — Progress Notes (Signed)
Outlined pt L foot to indicate the edema and redness and also to indicate if any further swelling or redness occurs.

## 2017-02-07 NOTE — Progress Notes (Signed)
Pt transported down to MRI via transport team.  

## 2017-02-07 NOTE — Progress Notes (Signed)
Pharmacy Antibiotic Note  [name] is a 71 y.o. male admitted on 02/07/2017 with wound infection.  Pharmacy has been consulted for Vancomycin and Cefepime dosing.  Plan:  Vancomycin 1500mg  x 1 then 1000mg  IV q12h Check trough at steady state Cefepime 2gm IV q24hrs Monitor labs, renal fxn, progress and c/s Deescalate ABX when improved / appropriate.    Height: 5\' 10"  (177.8 cm) Weight: 235 lb (106.6 kg) IBW/kg (Calculated) : 73  Temp (24hrs), Avg:97.7 F (36.5 C), Min:97.7 F (36.5 C), Max:97.7 F (36.5 C)   Recent Labs Lab 02/07/17 1540  CREATININE 1.58*    Estimated Creatinine Clearance: 54.7 mL/min (A) (by C-G formula based on SCr of 1.58 mg/dL (H)).    No Known Allergies  Antimicrobials this admission: Vancomycin 6/26 >>  Cefepime 6/26 >>   Dose adjustments this admission:  Microbiology results:  BCx: pending  UCx: pending   Sputum:    MRSA PCR:   Thank you for allowing pharmacy to be a part of this patient's care.  Hart Robinsons A 02/07/2017 4:45 PM

## 2017-02-07 NOTE — H&P (Signed)
History and Physical  [name] MBT:[id number] DOB: 1945/09/26 DOA: 02/07/2017   PCP: Redmond School, MD   Patient coming from: Home  Chief Complaint: foot edema  HPI:  [name] is a 71 y.o. male with medical history of diabetes mellitus type 2, hypertension, hyperlipidemia, peripheral vascular disease, coronary artery disease status post CABG 2010, CKD stage III, bioprosthetic aortic valve 2010, and paroxysmal atrial fibrillation on apixaban presenting with increasing pain and edema about his left foot. The patient is a difficult historian in part due to his health literacy. Initially, the patient stated that his left foot edema, erythema, and pain had not changed since his recent discharge from the hospital on 12/14/2016. However, upon repeated questioning, it is revealed that the patient has increasing edema, erythema, and pain about his left foot. It is difficult to ascertain the exact timeframe with which his foot has worsened. However, after speaking with the patient's primary care provider, Dr. Gerarda Fraction, it appears that his left foot has worsened in the past 2 weeks. The patient was given a prescription for amoxicillin/clavulanate on 01/24/2017 with which he was compliant. He saw Dr. Gerarda Fraction in the office today after he was sent there from PT wound care due to concerns of his foot edema and erythema.  Because of concerns of his foot, the patient was directly admitted to the hospital for further intravenous antibiotics and workup. Notably, the patient was previously on doxycycline which was prescribed on 01/10/2017 prior to his amox/clav. During his hospitalization from 12/12/2016 through 12/14/2016, MRI of the left foot was negative for ostomy mellitus but showed cellulitis and myositis. He was ultimately discharged with doxycycline to finish 10 additional days. The patient has had some subjective fevers and chills but denies any present chest pain, shortness breath, nausea,  vomiting, diarrhea, abdominal pain, dysuria, hematuria, headache. He denies walking barefoot. He denies any recent injury or trauma to his left foot. He denies any purulent drainage from his left foot. He denies any claudication-type symptoms, but states that he has had some pain weightbearing on his left foot. Upon presentation, the patient was afebrile and hemodynamically stable.   Assessment/Plan: Diabetic foot infection--Left foot -Concerned about underlying abscess--patient has area of fluctuance in the left first MTP joint area -MRI left foot -CRP -ESR -BMP and CBC -Blood cultures 2 sets -Empiric vancomycin and cefepime  Peripheral vascular disease -04/23/15--right lower extremity arteriogram with recanalization of the right SFA occlusion and angioplasty/stenting of the right SFA and popliteal artery -10/07/15--right atherectomy and drug-eluting balloon angioplasty of the distal SFA/popliteal stenosis as well as atherectomy and angioplasty of a peroneal artery stenosis -12/12/16--ABI were 0.41 on the right and 0.62 on the left, previously 0.66 and 0.61 respectively  Paroxysmal atrial fibrillation  -Presently in sinus rhythm  -Continue amiodarone  -Continue apixaban  -Continue metoprolol succinate   Diabetes mellitus type 2, uncontrolled with hyperglycemia  -12/12/2016 hemoglobin A1c 8.1  -Repeat hemoglobin A1c  -Start reduced dose Lantus  -NovoLog sliding scale  -holding invokana  Coronary artery disease with history of CABG 2010/bioprosthetic aortic valve 2010 -Asymptomatic presently without symptoms of angina -Continue metoprolol succinate  Hyperlipidemia -Continue Lipitor and TriCor  Hypothyroidism -Continue Synthroid  Chronic diastolic CHF -Appears euvolemic clinically -Continue torsemide home dose -Daily weights -10/21/2016 echo EF 55%, grade 1 DD  CKD stage 3 -baseline creatinine 1.2-1.5       Past Medical History:  Diagnosis Date  . At risk for sudden  cardiac death 07-01-13  .  Cataract   . Chronic anticoagulation 05/2013   started  . Chronic kidney disease    Patient reports he is seeing Kidney doctor in September  . Coronary artery disease   . Diabetes mellitus    type 2   . Dyslipidemia   . GERD (gastroesophageal reflux disease)   . History of valve replacement 2010   [facility] Ease bioprosthetic 69m  . Hypertension   . [place]   converted with amiodarone to SR  . S/P CABG x 3 2010  . S/P cardiac catheterization, Rt & Lt heart cath 06/05/2013 06/07/2013  . Stroke ([facility] 2003   R-sided weakness & upper extremity swelling   . Thyroid disease    Past Surgical History:  Procedure Laterality Date  . AORTIC VALVE REPLACEMENT  [facility]/26/2009   [facility]  . CARDIAC CATHETERIZATION  06/05/2013   native 3 vessel disease; patent LIMA to LAD, SVG to OM and SVG to PDA; Elevated right and left heart filling pressures, with predominantly pulmonary venous hypertension, normal PVR  . COLONOSCOPY  2007   Dr. MaAviva Signsinternal hemorrhoids  . COLONOSCOPY N/A 06/16/2016   Procedure: COLONOSCOPY;  Surgeon: NaRogene HoustonMD;  Location: [facility];  Service: Endoscopy;  Laterality: N/A;  1:45  . CORONARY ARTERY BYPASS GRAFT  01/26/2009   LIMA to LAD, SVG to circumflex, SVG to PDA  . GRAFT(S) ANGIOGRAM  06/05/2013   Procedure: GRAFT(S) ANCyril Loosen Surgeon: DaLeonie ManMD;  [facility];  Service: Cardiovascular;;  . IR GENERIC HISTORICAL  05/12/2016   IR RADIOLOGIST EVAL & MGMT 05/12/2016 HeJacqulynn CadetMD [facility] INTERV RAD  . IR RADIOLOGIST EVAL & MGMT  01/04/2017  . LEFT AND RIGHT HEART CATHETERIZATION WITH CORONARY ANGIOGRAM N/A 06/05/2013   Procedure: LEFT AND RIGHT HEART CATHETERIZATION WITH CORONARY ANGIOGRAM;  Surgeon: DaLeonie ManMD;  [facility];  Service: Cardiovascular;  Laterality: N/A;  . TRANSTHORACIC ECHOCARDIOGRAM   09/2011   grade 1 diastolic dysfunction; increasing valve gradient; calcified MV annulus   . TRANSTHORACIC ECHOCARDIOGRAM  06/03/2013   EF 25-30%, mod conc. hypertrophy, grade 3 diastolic dysfunction; LA mod dilated; calcified MV annulus; transaortic gradients are normal for the bioprosthetic valve; inf vena cava dilated (elevated CVP) - LifeVest   Social History:  reports that he quit smoking about 15 years ago. His smoking use included Cigarettes. He has quit using smokeless tobacco. His smokeless tobacco use included Chew. He reports that he does not drink alcohol or use drugs.   Family History  Problem Relation Age of Onset  . Heart attack Mother   . Liver disease Brother   . Diabetes Sister        x4     No Known Allergies   Prior to Admission medications   Medication Sig Start Date End Date Taking? Authorizing Provider  acetaminophen (TYLENOL) 500 MG tablet Take 1,000 mg by mouth 2 (two) times daily as needed for headache. For pain/headaches    [provider]  alprazolam (XDuanne Moron2 MG tablet Take 1 mg by mouth 3 (three) times daily.     [provider]  amiodarone (PACERONE) 200 MG tablet Take 1 tablet (200 mg total) by mouth daily. 10/14/16   Hilty, KeNadean CorwinMD  apixaban (ELIQUIS) 5 MG TABS tablet Take 1 tablet (5 mg total) by mouth 2 (two) times daily. 10/14/16   Hilty, KeNadean CorwinMD  atorvastatin (LIPITOR) 20  MG tablet Take 1 tablet (20 mg total) by mouth daily at 6 PM. 06/18/16   Rehman, Mechele Dawley, MD  docusate sodium (COLACE) 100 MG capsule Take 100 mg by mouth 2 (two) times daily.    [provider]  doxycycline (VIBRAMYCIN) 100 MG capsule Take 1 capsule (100 mg total) by mouth 2 (two) times daily. 12/14/16   Isaac Bliss, Rayford Halsted, MD  fenofibrate (TRICOR) 145 MG tablet TAKE ONE TABLET EACH DAY. 11/01/16   Pixie Casino, MD  ferrous sulfate 325 (65 FE) MG EC tablet Take 325 mg by mouth daily with breakfast.    [provider]  gabapentin  (NEURONTIN) 300 MG capsule Take 300 mg by mouth 2 (two) times daily.     [provider]  insulin glargine (LANTUS) 100 UNIT/ML injection Inject 0.6-0.75 mLs (60-75 Units total) into the skin 2 (two) times daily. 75 units in the morning then 60 units at bedtime 02/25/16   Hongalgi, Lenis Dickinson, MD  insulin lispro (HUMALOG) 100 UNIT/ML injection Inject 0.35-0.5 mLs (35-50 Units total) into the skin 3 (three) times daily before meals. Patient states that he takes 35 units at breakfast, 50 units at dinner and 50 units at supper. 02/25/16   Hongalgi, Lenis Dickinson, MD  INVOKANA 100 MG TABS tablet Take 1 tablet by mouth daily. 12/09/16   [provider]  levothyroxine (SYNTHROID, LEVOTHROID) 25 MCG tablet Take 25 mcg by mouth daily before breakfast.    [provider]  meclizine (ANTIVERT) 25 MG tablet Take 25 mg by mouth 3 (three) times daily as needed for dizziness.    [provider]  metoprolol succinate (TOPROL-XL) 25 MG 24 hr tablet Take 1 tablet (25 mg total) by mouth daily. 10/14/16   Hilty, Nadean Corwin, MD  Omega-3 Fatty Acids (FISH OIL) 1200 MG CAPS Take by mouth.    [provider]  pantoprazole (PROTONIX) 40 MG tablet Take 1 tablet (40 mg total) by mouth 2 (two) times daily. 10/30/14   Hilty, Nadean Corwin, MD  torsemide (DEMADEX) 10 MG tablet Take 1 tablet (10 mg total) by mouth daily. 10/14/16   Pixie Casino, MD    Review of Systems:  Constitutional:  No weight loss, night sweats, Fevers, chills, fatigue.  Head&Eyes: No headache.  No vision loss.  No eye pain or scotoma ENT:  No Difficulty swallowing,Tooth/dental problems,Sore throat,  No ear ache, post nasal drip,  Cardio-vascular:  No chest pain, Orthopnea, PND, swelling in lower extremities,  dizziness, palpitations  GI:  No  abdominal pain, nausea, vomiting, diarrhea, loss of appetite, hematochezia, melena, heartburn, indigestion, Resp:  No shortness of breath with exertion or at rest. No cough. No  coughing up of blood .No wheezing.No chest wall deformity  Skin:  no rash or lesions.  GU:  no dysuria, change in color of urine, no urgency or frequency. No flank pain.  Musculoskeletal:  Right foot pain and edema.  No decreased range of motion. No back pain.  Psych:  No change in mood or affect. No depression or anxiety. Neurologic: No headache, no dysesthesia, no focal weakness, no vision loss. No syncope  Physical Exam: Vitals:   02/07/17 1335  BP: 134/61  Pulse: 73  Resp: 18  Temp: 97.7 F (36.5 C)  TempSrc: Oral  SpO2: 96%  Weight: 106.6 kg (235 lb)  Height: '5\' 10"'  (1.778 m)   General:  A&O x 3, NAD, nontoxic, pleasant/cooperative Head/Eye: No conjunctival hemorrhage, no icterus, [place]/AT, No nystagmus ENT:  No icterus,  No thrush, good dentition, no pharyngeal exudate Neck:  No masses, no lymphadenpathy, no bruits CV:  RRR, no rub, no gallop, no S3 Lung:  CTAB, good air movement, no wheeze, no rhonchi Abdomen: soft/NT, +BS, nondistended, no peritoneal signs Ext: No cyanosis, No rashes, No petechiae, No lymphangitis, 1 + LE edema Neuro: CNII-XII intact, strength 4/5 in bilateral upper and lower extremities, no dysmetria Left foot on 02/07/17--dorsalis pedis pulse present.  Warm to touch     Labs on Admission:  Basic Metabolic Panel: No results for input(s): NA, K, CL, CO2, GLUCOSE, BUN, CREATININE, CALCIUM, MG, PHOS in the last 168 hours. Liver Function Tests: No results for input(s): AST, ALT, ALKPHOS, BILITOT, PROT, ALBUMIN in the last 168 hours. No results for input(s): LIPASE, AMYLASE in the last 168 hours. No results for input(s): AMMONIA in the last 168 hours. CBC: No results for input(s): WBC, NEUTROABS, HGB, HCT, MCV, PLT in the last 168 hours. Coagulation Profile: No results for input(s): INR, PROTIME in the last 168 hours. Cardiac Enzymes: No results for input(s): CKTOTAL, CKMB, CKMBINDEX, TROPONINI in the last 168 hours. BNP: Invalid input(s):  POCBNP CBG: No results for input(s): GLUCAP in the last 168 hours. Urine analysis:    Component Value Date/Time   COLORURINE STRAW (A) 02/23/2016 2140   APPEARANCEUR CLEAR 02/23/2016 2140   LABSPEC 1.023 02/23/2016 2140   PHURINE 7.0 02/23/2016 2140   GLUCOSEU >1000 (A) 02/23/2016 2140   HGBUR NEGATIVE 02/23/2016 2140   BILIRUBINUR NEGATIVE 02/23/2016 2140   KETONESUR NEGATIVE 02/23/2016 2140   PROTEINUR NEGATIVE 02/23/2016 2140   UROBILINOGEN 0.2 11/25/2013 1741   NITRITE NEGATIVE 02/23/2016 2140   LEUKOCYTESUR NEGATIVE 02/23/2016 2140   Sepsis Labs: '@LABRCNTIP' (procalcitonin:4,lacticidven:4) )No results found for this or any previous visit (from the past 240 hour(s)).   Radiological Exams on Admission: No results found.      Time spent:60 minutes Code Status:   FULL Family Communication:  No Family at bedside Disposition Plan: expect 2-3 day hospitalization Consults called: none DVT Prophylaxis: apixaban  Emmah Bratcher, DO  [facility] Pager [phone]  If 7PM-7AM, please contact night-coverage [email or web address] Password [facility] 02/07/2017, 2:24 PM

## 2017-02-08 LAB — BASIC METABOLIC PANEL
ANION GAP: 8 (ref 5–15)
BUN: 21 mg/dL — AB (ref 6–20)
CALCIUM: 8.7 mg/dL — AB (ref 8.9–10.3)
CO2: 27 mmol/L (ref 22–32)
Chloride: 102 mmol/L (ref 101–111)
Creatinine, Ser: 1.34 mg/dL — ABNORMAL HIGH (ref 0.61–1.24)
GFR calc Af Amer: >60 mL/min (ref >60)
GFR, EST NON AFRICAN AMERICAN: 53 mL/min — AB (ref >60)
GLUCOSE: 218 mg/dL — AB (ref 65–99)
POTASSIUM: 4.3 mmol/L (ref 3.5–5.1)
SODIUM: 137 mmol/L (ref 135–145)

## 2017-02-08 LAB — GLUCOSE, CAPILLARY
GLUCOSE-CAPILLARY: 254 mg/dL — AB (ref 65–99)
GLUCOSE-CAPILLARY: 236 mg/dL — AB (ref 65–99)
GLUCOSE-CAPILLARY: 242 mg/dL — AB (ref 65–99)
Glucose-Capillary: 284 mg/dL — ABNORMAL HIGH (ref 65–99)

## 2017-02-08 LAB — CBC
HEMATOCRIT: 35.4 % — AB (ref 39.0–52.0)
Hemoglobin: 11.2 g/dL — ABNORMAL LOW (ref 13.0–17.0)
MCH: 28 pg (ref 26.0–34.0)
MCHC: 31.6 g/dL (ref 30.0–36.0)
MCV: 88.5 fL (ref 78.0–100.0)
Platelets: 234 10*3/uL (ref 150–400)
RBC: 4 MIL/uL — ABNORMAL LOW (ref 4.22–5.81)
RDW: 13.6 % (ref 11.5–15.5)
WBC: 7 10*3/uL (ref 4.0–10.5)

## 2017-02-08 MED ORDER — INSULIN ASPART 100 UNIT/ML ~~LOC~~ SOLN
4.0000 [IU] | Freq: Three times a day (TID) | SUBCUTANEOUS | Status: DC
  Administered 2017-02-09 – 2017-02-12 (×12): 4 [IU] via SUBCUTANEOUS

## 2017-02-08 MED ORDER — INSULIN GLARGINE 100 UNIT/ML ~~LOC~~ SOLN
40.0000 [IU] | Freq: Every day | SUBCUTANEOUS | Status: DC
  Administered 2017-02-08: 40 [IU] via SUBCUTANEOUS
  Filled 2017-02-08 (×2): qty 0.4

## 2017-02-08 NOTE — Plan of Care (Signed)
Problem: Pain Managment: Goal: General experience of comfort will improve Outcome: Progressing Pt has had no c/o pain this shift. Pt states foot is swollen but does not hurt. Pt given pain mgmt education. Will continue to monitor pt

## 2017-02-08 NOTE — Progress Notes (Signed)
Inpatient Diabetes Program Recommendations  AACE/ADA: New Consensus Statement on Inpatient Glycemic Control (2015)  Target Ranges:  Prepandial:   less than 140 mg/dL      Peak postprandial:   less than 180 mg/dL (1-2 hours)      Critically ill patients:  140 - 180 mg/dL   Lab Results  Component Value Date   GLUCAP 236 (H) 02/08/2017   HGBA1C 8.1 (H) 12/12/2016    Review of Glycemic Control  Diabetes history: DM2 Outpatient Diabetes medications: Lantus 75 units in am and 60 units QHS, Humalog 35-50-50 tidwc, Invokana 100 mg Q Friday Current orders for Inpatient glycemic control: Lantus 35 units QHS, Novolog 0-20 units tidwc and hs  HgbA1C pending.  Inpatient Diabetes Program Recommendations:    Increase Lantus to 35 units bid Add Novolog 10 units tidwc for meal coverage insulin.  Will follow pending HgbA1C results.  Thank you. Lorenda Peck, RD, LDN, CDE Inpatient Diabetes Coordinator [phone]

## 2017-02-08 NOTE — Progress Notes (Signed)
PROGRESS NOTE    [name]  SKA:[id number] DOB: April 15, 1946 DOA: 02/07/2017 PCP: Redmond School, MD     Brief Narrative:  71 year old man admitted to the hospital on 6/26 from his PCPs office due to worsening edema and erythema of his left foot. MRI shows evidence for osteomyelitis, septic arthritis and an abscess along the aspect of the first MTP joint and admission has been requested.   Assessment & Plan:   Active Problems:   S/P CABG x 3 - 2010   S/P AVR-tissue, 2010   Dyslipidemia   Obesity (BMI 30-39.9)   Peripheral arterial occlusive disease (HCC)   PVD (peripheral vascular disease) (HCC)   A-fib (HCC)   CKD (chronic kidney disease), stage III   Diabetic foot infection ([place])   Type 2 diabetes mellitus with left diabetic foot infection ([place])   Uncontrolled type 2 diabetes mellitus with hyperglycemia, with long-term current use of insulin (HCC)   Left foot osteomyelitis -MRI shows severe extensive bone destruction of the distal half of the first metatarsal and base of the first proximal phalanx centered around the first MTP joint most consistent with septic arthritis and osteomyelitis as well as an abscess along the medial aspect of the first MTP joint. -Interestingly he was admitted to the hospital at the end of April for cellulitis of the same foot, at that time MRI did not show evidence for osteomyelitis and he was discharged home on a course of doxycycline. -He has been started on vancomycin and cefepime, podiatry consultation was requested however they are unable to see him as an inpatient. -Gen. surgery consultation has been requested today. -Will hold Eliquis as he will likely need surgical exploration and possibly amputation.  Peripheral vascular disease -04/23/15--right lower extremity arteriogram with recanalization of the right SFA occlusion and angioplasty/stenting of the right SFA and popliteal artery -10/07/15--right atherectomy and drug-eluting balloon  angioplasty of the distal SFA/popliteal stenosis as well as atherectomy and angioplasty of a peroneal artery stenosis -12/12/16--ABI were 0.41 on the right and 0.62 on the left, previously 0.66 and 0.61 respectively  Paroxysmal A. Fib -Currently in sinus rhythm and rate controlled -Continue amiodarone and metoprolol, will hold Eliquis given high likelihood of surgical intervention.  Type 2 diabetes -CBGs are uncontrolled. -Increase Lantus from 35-40 units and add 4 units of meal coverage.  Hyperlipidemia  -continue Lovaza  Hypothyroidism -Continue Synthroid  Stage III chronic kidney disease -Baseline creatinine between 1.2 and 1.5 -Currently at baseline  Chronic diastolic CHF -Echo in March 2018 with an ejection fraction of 55% and grade 1 diastolic dysfunction. -Is clinically euvolemic.    DVT prophylaxis: SCDs Code Status: full code Family Communication: patient only Disposition Plan: pending surgical consultation  Consultants:   Surgery  Procedures:   None  Antimicrobials:  Anti-infectives    Start     Dose/Rate Route Frequency Ordered Stop   02/08/17 1000  ceFEPIme (MAXIPIME) 2 g in dextrose 5 % 50 mL IVPB     2 g 100 mL/hr over 30 Minutes Intravenous Every 24 hours 02/07/17 1644     02/08/17 0600  vancomycin (VANCOCIN) IVPB 1000 mg/200 mL premix     1,000 mg 200 mL/hr over 60 Minutes Intravenous Every 12 hours 02/07/17 1643     02/07/17 1445  vancomycin (VANCOCIN) 1,500 mg in sodium chloride 0.9 % 500 mL IVPB     1,500 mg 250 mL/hr over 120 Minutes Intravenous  Once 02/07/17 1433 02/08/17 0009   02/07/17 1445  ceFEPIme (MAXIPIME) 2  g in dextrose 5 % 50 mL IVPB     2 g 100 mL/hr over 30 Minutes Intravenous  Once 02/07/17 1433 02/07/17 2200       Subjective: Sleeping when I walk into room, sitting in chair, arouses easily to voice but falls back asleep quickly, discussed with staff, he has been awake and oriented all day.   Objective: Vitals:    02/07/17 1335 02/07/17 2030 02/07/17 2100 02/08/17 0500  BP: 134/61  (!) 143/49 (!) 123/41  Pulse: 73   64  Resp: 18  20 20   Temp: 97.7 F (36.5 C)  98 F (36.7 C) 97.7 F (36.5 C)  TempSrc: Oral  Oral Oral  SpO2: 96% 98% 97% 100%  Weight: 106.6 kg (235 lb)   106.6 kg (235 lb)  Height: 5\' 10"  (1.778 m)       Intake/Output Summary (Last 24 hours) at 02/08/17 1702 Last data filed at 02/08/17 1051  Gross per 24 hour  Intake             1050 ml  Output              750 ml  Net              300 ml   Filed Weights   02/07/17 1335 02/08/17 0500  Weight: 106.6 kg (235 lb) 106.6 kg (235 lb)    Examination:  General exam: Drowsy Respiratory system: Clear to auscultation. Respiratory effort normal. Cardiovascular system:RRR. No murmurs, rubs, gallops. Gastrointestinal system: Abdomen is nondistended, soft and nontender. No organomegaly or masses felt. Normal bowel sounds heard. Central nervous system: Alert and oriented. No focal neurological deficits. Extremities:  significant edema and erythema surrounding left big toe and extending up into the midfoot Psychiatry: Judgement and insight appear normal. Mood & affect appropriate.     Data Reviewed: I have personally reviewed following labs and imaging studies  CBC:  Recent Labs Lab 02/07/17 1540 02/08/17 0552  WBC 7.4 7.0  HGB 11.9* 11.2*  HCT 36.7* 35.4*  MCV 88.0 88.5  PLT 252 326   Basic Metabolic Panel:  Recent Labs Lab 02/07/17 1540 02/08/17 0552  NA 136 137  K 4.1 4.3  CL 100* 102  CO2 26 27  GLUCOSE 220* 218*  BUN 24* 21*  CREATININE 1.58* 1.34*  CALCIUM 9.1 8.7*   GFR: Estimated Creatinine Clearance: 64.5 mL/min (A) (by C-G formula based on SCr of 1.34 mg/dL (H)). Liver Function Tests: No results for input(s): AST, ALT, ALKPHOS, BILITOT, PROT, ALBUMIN in the last 168 hours. No results for input(s): LIPASE, AMYLASE in the last 168 hours. No results for input(s): AMMONIA in the last 168  hours. Coagulation Profile: No results for input(s): INR, PROTIME in the last 168 hours. Cardiac Enzymes: No results for input(s): CKTOTAL, CKMB, CKMBINDEX, TROPONINI in the last 168 hours. BNP (last 3 results) No results for input(s): PROBNP in the last 8760 hours. HbA1C: No results for input(s): HGBA1C in the last 72 hours. CBG:  Recent Labs Lab 02/07/17 1742 02/07/17 2118 02/08/17 0807 02/08/17 1137 02/08/17 1659  GLUCAP 190* 244* 254* 236* 242*   Lipid Profile: No results for input(s): CHOL, HDL, LDLCALC, TRIG, CHOLHDL, LDLDIRECT in the last 72 hours. Thyroid Function Tests: No results for input(s): TSH, T4TOTAL, FREET4, T3FREE, THYROIDAB in the last 72 hours. Anemia Panel: No results for input(s): VITAMINB12, FOLATE, FERRITIN, TIBC, IRON, RETICCTPCT in the last 72 hours. Urine analysis:    Component Value Date/Time   COLORURINE  STRAW (A) 02/23/2016 2140   APPEARANCEUR CLEAR 02/23/2016 2140   LABSPEC 1.023 02/23/2016 2140   PHURINE 7.0 02/23/2016 2140   GLUCOSEU >1000 (A) 02/23/2016 2140   HGBUR NEGATIVE 02/23/2016 2140   BILIRUBINUR NEGATIVE 02/23/2016 2140   KETONESUR NEGATIVE 02/23/2016 2140   PROTEINUR NEGATIVE 02/23/2016 2140   UROBILINOGEN 0.2 11/25/2013 1741   NITRITE NEGATIVE 02/23/2016 2140   LEUKOCYTESUR NEGATIVE 02/23/2016 2140   Sepsis Labs: @LABRCNTIP (procalcitonin:4,lacticidven:4)  ) Recent Results (from the past 240 hour(s))  Culture, blood (Routine X 2) w Reflex to ID Panel     Status: None (Preliminary result)   Collection Time: 02/07/17  3:40 PM  Result Value Ref Range Status   Specimen Description   Final    RIGHT ANTECUBITAL BOTTLES DRAWN AEROBIC AND ANAEROBIC   Special Requests Blood Culture adequate volume  Final   Culture NO GROWTH < 24 HOURS  Final   Report Status PENDING  Incomplete  Culture, blood (Routine X 2) w Reflex to ID Panel     Status: None (Preliminary result)   Collection Time: 02/07/17  3:40 PM  Result Value Ref Range  Status   Specimen Description   Final    RIGHT ANTECUBITAL BOTTLES DRAWN AEROBIC AND ANAEROBIC   Special Requests   Final    Blood Culture results may not be optimal due to an inadequate volume of blood received in culture bottles   Culture NO GROWTH < 24 HOURS  Final   Report Status PENDING  Incomplete         Radiology Studies: Mr Foot Left Wo Contrast  Result Date: 02/08/2017 CLINICAL DATA:  Pain and discoloration of the distal left foot. EXAM: MRI OF THE LEFT FOOT WITHOUT CONTRAST TECHNIQUE: Multiplanar, multisequence MR imaging of the left forefoot was performed. No intravenous contrast was administered. COMPARISON:  12/12/2016 FINDINGS: Bones/Joint/Cartilage Soft tissue wound along the plantar aspect of the first MTP joint. 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 7.3 x 20 mm complex fluid collection along the medial aspect of the first MTP joint concerning for an abscess. No acute fracture or dislocation. No other marrow signal abnormality. Normal alignment. No joint effusion. Ligaments Collateral ligaments are intact.  Lisfranc ligament is intact. Muscles and Tendons Flexor, peroneal and extensor compartment tendons are intact. No significant muscle atrophy. Mild diffuse increased T2 hyperintense signal throughout the muscles likely neurogenic given the patient's history diabetes versus less likely myositis. Soft tissue No other fluid collection or hematoma.  No soft tissue mass. IMPRESSION: Soft tissue wound along the plantar aspect of the first MTP joint. 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 7.3 x 20 mm abscess along the medial aspect of the first MTP  joint. Electronically Signed   By: Kathreen Devoid   On: 02/08/2017 08:25        Scheduled Meds: . alprazolam  1 mg Oral TID  . amiodarone  200 mg Oral Daily  . apixaban  5 mg Oral BID  . atorvastatin  20 mg Oral q1800  . docusate sodium  100 mg Oral BID  . fenofibrate  160 mg Oral Daily  . ferrous sulfate  325 mg Oral Q breakfast  . gabapentin  300 mg  Oral BID  . insulin aspart  0-20 Units Subcutaneous TID WC  . insulin aspart  0-5 Units Subcutaneous QHS  . insulin glargine  35 Units Subcutaneous QHS  . levothyroxine  25 mcg Oral QAC breakfast  . metoprolol succinate  25 mg Oral Daily  . omega-3 acid ethyl esters  1 g Oral Daily  . pantoprazole  40 mg Oral BID  . torsemide  10 mg Oral Daily   Continuous Infusions: . ceFEPime (MAXIPIME) IV Stopped (02/08/17 1051)  . vancomycin Stopped (02/08/17 0627)     LOS: 1 day    Time spent: 30 minutes. Greater than 50% of this time was spent in direct contact with the patient coordinating care.     Lelon Frohlich, MD [facility] Pager [phone]  If 7PM-7AM, please contact night-coverage [email or web address] Password [email or web address] 02/08/2017, 5:02 PM

## 2017-02-09 ENCOUNTER — Other Ambulatory Visit: Payer: Medicare Other

## 2017-02-09 ENCOUNTER — Ambulatory Visit: Payer: Self-pay | Admitting: *Deleted

## 2017-02-09 ENCOUNTER — Ambulatory Visit (HOSPITAL_COMMUNITY): Payer: Medicare Other | Admitting: Physical Therapy

## 2017-02-09 ENCOUNTER — Other Ambulatory Visit: Payer: Self-pay | Admitting: *Deleted

## 2017-02-09 LAB — GLUCOSE, CAPILLARY
GLUCOSE-CAPILLARY: 237 mg/dL — AB (ref 65–99)
GLUCOSE-CAPILLARY: 269 mg/dL — AB (ref 65–99)
Glucose-Capillary: 209 mg/dL — ABNORMAL HIGH (ref 65–99)
Glucose-Capillary: 271 mg/dL — ABNORMAL HIGH (ref 65–99)

## 2017-02-09 LAB — BASIC METABOLIC PANEL
Anion gap: 7 (ref 5–15)
BUN: 20 mg/dL (ref 6–20)
CHLORIDE: 100 mmol/L — AB (ref 101–111)
CO2: 28 mmol/L (ref 22–32)
CREATININE: 1.37 mg/dL — AB (ref 0.61–1.24)
Calcium: 8.8 mg/dL — ABNORMAL LOW (ref 8.9–10.3)
GFR calc non Af Amer: 51 mL/min — ABNORMAL LOW (ref >60)
GFR, EST AFRICAN AMERICAN: 60 mL/min — AB (ref >60)
GLUCOSE: 230 mg/dL — AB (ref 65–99)
Potassium: 4.2 mmol/L (ref 3.5–5.1)
Sodium: 135 mmol/L (ref 135–145)

## 2017-02-09 LAB — CBC
HCT: 34.2 % — ABNORMAL LOW (ref 39.0–52.0)
Hemoglobin: 11.1 g/dL — ABNORMAL LOW (ref 13.0–17.0)
MCH: 28.1 pg (ref 26.0–34.0)
MCHC: 32.5 g/dL (ref 30.0–36.0)
MCV: 86.6 fL (ref 78.0–100.0)
PLATELETS: 209 10*3/uL (ref 150–400)
RBC: 3.95 MIL/uL — AB (ref 4.22–5.81)
RDW: 13.4 % (ref 11.5–15.5)
WBC: 6.4 10*3/uL (ref 4.0–10.5)

## 2017-02-09 LAB — MRSA PCR SCREENING: MRSA by PCR: NEGATIVE

## 2017-02-09 MED ORDER — DEXTROSE 5 % IV SOLN
2.0000 g | Freq: Two times a day (BID) | INTRAVENOUS | Status: DC
  Administered 2017-02-10 – 2017-02-13 (×8): 2 g via INTRAVENOUS
  Filled 2017-02-09 (×10): qty 2

## 2017-02-09 MED ORDER — INSULIN GLARGINE 100 UNIT/ML ~~LOC~~ SOLN
45.0000 [IU] | Freq: Every day | SUBCUTANEOUS | Status: DC
  Administered 2017-02-09: 45 [IU] via SUBCUTANEOUS
  Filled 2017-02-09: qty 0.45

## 2017-02-09 NOTE — Progress Notes (Signed)
Inpatient Diabetes Program Recommendations  AACE/ADA: New Consensus Statement on Inpatient Glycemic Control (2015)  Target Ranges:  Prepandial:   less than 140 mg/dL      Peak postprandial:   less than 180 mg/dL (1-2 hours)      Critically ill patients:  140 - 180 mg/dL   Results for [name], [name] (MRN [id number]) as of 02/09/2017 09:41  Ref. Range 02/08/2017 08:07 02/08/2017 11:37 02/08/2017 16:59 02/08/2017 22:32 02/09/2017 07:33  Glucose-Capillary Latest Ref Range: 65 - 99 mg/dL 254 (H) 236 (H) 242 (H) 284 (H) 237 (H)  Review of Glycemic Control  Diabetes history:DM2 Outpatient Diabetes medications: Lantus 75 units QAM, Lantus 60 units QHS, Humalog 35 units with breakfast, Humalog 50 units with lunch and supper, Invokana 100 mg daily Current orders for Inpatient glycemic control: Lantus 40 units QHS, Novolog 0-20 units TID with meals, Novolog 0-5 units QHS, Novolog 4 units TID with meals  Inpatient Diabetes Program Recommendations: Insulin - Basal: Please consider increasing Lantus to 30 units BID (starting now). Insulin - Meal Coverage: Please consider increasing meal coverage to Novolog 8 units TID with meals.  Thanks, Barnie Alderman, RN, MSN, CDE Diabetes Coordinator Inpatient Diabetes Program [phone] (Team Pager from 8am to 5pm)

## 2017-02-09 NOTE — Progress Notes (Signed)
Report given to Court Joy on [place].

## 2017-02-09 NOTE — Consult Note (Signed)
   Patient is followed by interventional radiology for his foot wound and has been intervened upon in the recent past. As such, defer management to them. Happy to assist as need with revascularization options and/or amputation/debridement as they see fit.   Karston Hyland C. Donzetta Matters, MD [facility] Office: [phone] Pager: [phone]

## 2017-02-09 NOTE — Consult Note (Signed)
Reason for Consult: Cellulitis and osteomyelitis, left foot Referring Physician: Dr. Burnard Bunting [name] is an 71 y.o. male.  HPI: Patient is a 71 year old white male with multiple medical problems including coronary artery disease, status post mitral valve replacement, peripheral vascular disease, stroke, diabetes mellitus who has had right peripheral arterial stents placed who now presents with left great toe swelling and mild pain. MRI of the left foot reveals osteomyelitis of the great toe. Patient denies any pain at the present time.  Past Medical History:  Diagnosis Date  . At risk for sudden cardiac death 06-13-2013  . Cataract   . Chronic anticoagulation 05/2013   started  . Chronic kidney disease    Patient reports he is seeing Kidney doctor in September  . Coronary artery disease   . Diabetes mellitus    type 2   . Dyslipidemia   . GERD (gastroesophageal reflux disease)   . History of valve replacement 2010   [facility] Ease bioprosthetic 10m  . Hypertension   . [place]   converted with amiodarone to SR  . S/P CABG x 3 2010  . S/P cardiac catheterization, Rt & Lt heart cath 06/05/2013 110/30/14 . Stroke ([facility] 2003   R-sided weakness & upper extremity swelling   . Thyroid disease     Past Surgical History:  Procedure Laterality Date  . [facility]  [facility]  . CARDIAC CATHETERIZATION  06/05/2013   native 3 vessel disease; patent LIMA to LAD, SVG to OM and SVG to PDA; Elevated right and left heart filling pressures, with predominantly pulmonary venous hypertension, normal PVR  . COLONOSCOPY  2007   Dr. MaAviva Signsinternal hemorrhoids  . COLONOSCOPY N/A 06/16/2016   Procedure: COLONOSCOPY;  Surgeon: NaRogene HoustonMD;  Location[facility];  Service: Endoscopy;  Laterality: N/A;  1:45  . CORONARY ARTERY BYPASS GRAFT  01/26/2009   LIMA to LAD, SVG  to circumflex, SVG to PDA  . GRAFT(S) ANGIOGRAM  06/05/2013   Procedure: GRAFT(S) ANCyril Loosen Surgeon: DaLeonie ManMD;  [facility];  Service: Cardiovascular;;  . IR GENERIC HISTORICAL  05/12/2016   IR RADIOLOGIST EVAL & MGMT 05/12/2016 HeJacqulynn CadetMD [facility] INTERV RAD  . IR RADIOLOGIST EVAL & MGMT  01/04/2017  . LEFT AND RIGHT HEART CATHETERIZATION WITH CORONARY ANGIOGRAM N/A 06/05/2013   Procedure: LEFT AND RIGHT HEART CATHETERIZATION WITH CORONARY ANGIOGRAM;  Surgeon: DaLeonie ManMD;  [facility];  Service: Cardiovascular;  Laterality: N/A;  . TRANSTHORACIC ECHOCARDIOGRAM  09/2011   grade 1 diastolic dysfunction; increasing valve gradient; calcified MV annulus   . TRANSTHORACIC ECHOCARDIOGRAM  06/03/2013   EF 25-30%, mod conc. hypertrophy, grade 3 diastolic dysfunction; LA mod dilated; calcified MV annulus; transaortic gradients are normal for the bioprosthetic valve; inf vena cava dilated (elevated CVP) - LifeVest    Family History  Problem Relation Age of Onset  . Heart attack Mother   . Liver disease Brother   . Diabetes Sister        x4    Social History:  reports that he quit smoking about 15 years ago. His smoking use included Cigarettes. He has quit using smokeless tobacco. His smokeless tobacco use included Chew. He reports that he does not drink alcohol or use drugs.  Allergies: No Known Allergies  Medications:  Prior to Admission:  Prescriptions Prior to Admission  Medication  Sig Dispense Refill Last Dose  . acetaminophen (TYLENOL) 500 MG tablet Take 1,000 mg by mouth 2 (two) times daily as needed for headache. For pain/headaches   02/06/2017 at Unknown time  . alprazolam (XANAX) 2 MG tablet Take 1 mg by mouth 3 (three) times daily.    02/07/2017 at Unknown time  . amiodarone (PACERONE) 200 MG tablet Take 1 tablet (200 mg total) by mouth daily. 90 tablet 3 unknown  . apixaban (ELIQUIS) 5 MG TABS tablet Take 1 tablet (5 mg total) by mouth 2  (two) times daily. 180 tablet 1 unknown  . atorvastatin (LIPITOR) 20 MG tablet Take 1 tablet (20 mg total) by mouth daily at 6 PM. 90 tablet 1 unknown  . fenofibrate (TRICOR) 145 MG tablet TAKE ONE TABLET EACH DAY. 30 tablet 10 unknown  . ferrous sulfate 325 (65 FE) MG EC tablet Take 325 mg by mouth daily with breakfast.   Past Week at Unknown time  . gabapentin (NEURONTIN) 300 MG capsule Take 300 mg by mouth 2 (two) times daily.    unknown  . insulin glargine (LANTUS) 100 UNIT/ML injection Inject 0.6-0.75 mLs (60-75 Units total) into the skin 2 (two) times daily. 75 units in the morning then 60 units at bedtime   02/07/2017 at Unknown time  . insulin lispro (HUMALOG) 100 UNIT/ML injection Inject 0.35-0.5 mLs (35-50 Units total) into the skin 3 (three) times daily before meals. Patient states that he takes 35 units at breakfast, 50 units at dinner and 50 units at supper. (Patient taking differently: Inject 35-45 Units into the skin 3 (three) times daily before meals. Patient states that he takes 35 units at breakfast, 50 units at dinner and 50 units at supper.)   02/07/2017 at Unknown time  . INVOKANA 100 MG TABS tablet Take 1 tablet by mouth daily.   unknown  . levothyroxine (SYNTHROID, LEVOTHROID) 25 MCG tablet Take 25 mcg by mouth daily before breakfast.   unknown  . meclizine (ANTIVERT) 25 MG tablet Take 25 mg by mouth 3 (three) times daily as needed for dizziness.   UNKNOWN  . metoprolol succinate (TOPROL-XL) 25 MG 24 hr tablet Take 1 tablet (25 mg total) by mouth daily. 90 tablet 3 unknown  . Omega-3 Fatty Acids (FISH OIL) 1200 MG CAPS Take 1 capsule by mouth daily.    02/07/2017 at Unknown time  . pantoprazole (PROTONIX) 40 MG tablet Take 1 tablet (40 mg total) by mouth 2 (two) times daily. 180 tablet 1 unknown  . silver sulfADIAZINE (SILVADENE) 1 % cream Apply 1 application topically daily.   unknown  . sulfamethoxazole-trimethoprim (BACTRIM DS,SEPTRA DS) 800-160 MG tablet Take 1 tablet by mouth 2  (two) times daily.   unknown  . torsemide (DEMADEX) 10 MG tablet Take 1 tablet (10 mg total) by mouth daily. 90 tablet 3 unknown  . amoxicillin-clavulanate (AUGMENTIN) 875-125 MG tablet Take 1 tablet by mouth 2 (two) times daily.    02/03/2017   Scheduled: . alprazolam  1 mg Oral TID  . amiodarone  200 mg Oral Daily  . atorvastatin  20 mg Oral q1800  . docusate sodium  100 mg Oral BID  . fenofibrate  160 mg Oral Daily  . ferrous sulfate  325 mg Oral Q breakfast  . gabapentin  300 mg Oral BID  . insulin aspart  0-20 Units Subcutaneous TID WC  . insulin aspart  0-5 Units Subcutaneous QHS  . insulin aspart  4 Units Subcutaneous TID WC  . insulin  glargine  40 Units Subcutaneous QHS  . levothyroxine  25 mcg Oral QAC breakfast  . metoprolol succinate  25 mg Oral Daily  . omega-3 acid ethyl esters  1 g Oral Daily  . pantoprazole  40 mg Oral BID  . torsemide  10 mg Oral Daily    Results for orders placed or performed during the hospital encounter of 02/07/17 (from the past 48 hour(s))  Sedimentation rate     Status: Abnormal   Collection Time: 02/07/17  3:40 PM  Result Value Ref Range   Sed Rate 63 (H) 0 - 16 mm/hr  C-reactive protein     Status: Abnormal   Collection Time: 02/07/17  3:40 PM  Result Value Ref Range   CRP 2.1 (H) <1.0 mg/dL    Comment: Performed at [facility], [place] [address] [place], [place] [address]  Culture, blood (Routine X 2) w Reflex to ID Panel     Status: None (Preliminary result)   Collection Time: 02/07/17  3:40 PM  Result Value Ref Range   Specimen Description      RIGHT ANTECUBITAL BOTTLES DRAWN AEROBIC AND ANAEROBIC   Special Requests Blood Culture adequate volume    Culture NO GROWTH 2 DAYS    Report Status PENDING   Culture, blood (Routine X 2) w Reflex to ID Panel     Status: None (Preliminary result)   Collection Time: 02/07/17  3:40 PM  Result Value Ref Range   Specimen Description      RIGHT ANTECUBITAL BOTTLES DRAWN AEROBIC AND  ANAEROBIC   Special Requests      Blood Culture results may not be optimal due to an inadequate volume of blood received in culture bottles   Culture NO GROWTH 2 DAYS    Report Status PENDING   Basic metabolic panel     Status: Abnormal   Collection Time: 02/07/17  3:40 PM  Result Value Ref Range   Sodium 136 135 - 145 mmol/L   Potassium 4.1 3.5 - 5.1 mmol/L   Chloride 100 (L) 101 - 111 mmol/L   CO2 26 22 - 32 mmol/L   Glucose, Bld 220 (H) 65 - 99 mg/dL   BUN 24 (H) 6 - 20 mg/dL   Creatinine, Ser 1.58 (H) 0.61 - 1.24 mg/dL   Calcium 9.1 8.9 - 10.3 mg/dL   GFR calc non Af Amer 43 (L) >60 mL/min   GFR calc Af Amer 50 (L) >60 mL/min    Comment: (NOTE) The eGFR has been calculated using the CKD EPI equation. This calculation has not been validated in all clinical situations. eGFR's persistently <60 mL/min signify possible Chronic Kidney Disease.    Anion gap 10 5 - 15  CBC     Status: Abnormal   Collection Time: 02/07/17  3:40 PM  Result Value Ref Range   WBC 7.4 4.0 - 10.5 K/uL   RBC 4.17 (L) 4.22 - 5.81 MIL/uL   Hemoglobin 11.9 (L) 13.0 - 17.0 g/dL   HCT 36.7 (L) 39.0 - 52.0 %   MCV 88.0 78.0 - 100.0 fL   MCH 28.5 26.0 - 34.0 pg   MCHC 32.4 30.0 - 36.0 g/dL   RDW 13.7 11.5 - 15.5 %   Platelets 252 150 - 400 K/uL  Glucose, capillary     Status: Abnormal   Collection Time: 02/07/17  5:42 PM  Result Value Ref Range   Glucose-Capillary 190 (H) 65 - 99 mg/dL   Comment 1 Notify RN  Comment 2 Document in Chart   Glucose, capillary     Status: Abnormal   Collection Time: 02/07/17  9:18 PM  Result Value Ref Range   Glucose-Capillary 244 (H) 65 - 99 mg/dL  Basic metabolic panel     Status: Abnormal   Collection Time: 02/08/17  5:52 AM  Result Value Ref Range   Sodium 137 135 - 145 mmol/L   Potassium 4.3 3.5 - 5.1 mmol/L   Chloride 102 101 - 111 mmol/L   CO2 27 22 - 32 mmol/L   Glucose, Bld 218 (H) 65 - 99 mg/dL   BUN 21 (H) 6 - 20 mg/dL   Creatinine, Ser 1.34 (H) 0.61 -  1.24 mg/dL   Calcium 8.7 (L) 8.9 - 10.3 mg/dL   GFR calc non Af Amer 53 (L) >60 mL/min   GFR calc Af Amer >60 >60 mL/min    Comment: (NOTE) The eGFR has been calculated using the CKD EPI equation. This calculation has not been validated in all clinical situations. eGFR's persistently <60 mL/min signify possible Chronic Kidney Disease.    Anion gap 8 5 - 15  CBC     Status: Abnormal   Collection Time: 02/08/17  5:52 AM  Result Value Ref Range   WBC 7.0 4.0 - 10.5 K/uL   RBC 4.00 (L) 4.22 - 5.81 MIL/uL   Hemoglobin 11.2 (L) 13.0 - 17.0 g/dL   HCT 35.4 (L) 39.0 - 52.0 %   MCV 88.5 78.0 - 100.0 fL   MCH 28.0 26.0 - 34.0 pg   MCHC 31.6 30.0 - 36.0 g/dL   RDW 13.6 11.5 - 15.5 %   Platelets 234 150 - 400 K/uL  Glucose, capillary     Status: Abnormal   Collection Time: 02/08/17  8:07 AM  Result Value Ref Range   Glucose-Capillary 254 (H) 65 - 99 mg/dL  Glucose, capillary     Status: Abnormal   Collection Time: 02/08/17 11:37 AM  Result Value Ref Range   Glucose-Capillary 236 (H) 65 - 99 mg/dL  Glucose, capillary     Status: Abnormal   Collection Time: 02/08/17  4:59 PM  Result Value Ref Range   Glucose-Capillary 242 (H) 65 - 99 mg/dL  Glucose, capillary     Status: Abnormal   Collection Time: 02/08/17 10:32 PM  Result Value Ref Range   Glucose-Capillary 284 (H) 65 - 99 mg/dL   Comment 1 Notify RN    Comment 2 Document in Chart   Basic metabolic panel     Status: Abnormal   Collection Time: 02/09/17  5:41 AM  Result Value Ref Range   Sodium 135 135 - 145 mmol/L   Potassium 4.2 3.5 - 5.1 mmol/L   Chloride 100 (L) 101 - 111 mmol/L   CO2 28 22 - 32 mmol/L   Glucose, Bld 230 (H) 65 - 99 mg/dL   BUN 20 6 - 20 mg/dL   Creatinine, Ser 1.37 (H) 0.61 - 1.24 mg/dL   Calcium 8.8 (L) 8.9 - 10.3 mg/dL   GFR calc non Af Amer 51 (L) >60 mL/min   GFR calc Af Amer 60 (L) >60 mL/min    Comment: (NOTE) The eGFR has been calculated using the CKD EPI equation. This calculation has not been  validated in all clinical situations. eGFR's persistently <60 mL/min signify possible Chronic Kidney Disease.    Anion gap 7 5 - 15  CBC     Status: Abnormal   Collection Time: 02/09/17  5:41  AM  Result Value Ref Range   WBC 6.4 4.0 - 10.5 K/uL   RBC 3.95 (L) 4.22 - 5.81 MIL/uL   Hemoglobin 11.1 (L) 13.0 - 17.0 g/dL   HCT 34.2 (L) 39.0 - 52.0 %   MCV 86.6 78.0 - 100.0 fL   MCH 28.1 26.0 - 34.0 pg   MCHC 32.5 30.0 - 36.0 g/dL   RDW 13.4 11.5 - 15.5 %   Platelets 209 150 - 400 K/uL  Glucose, capillary     Status: Abnormal   Collection Time: 02/09/17  7:33 AM  Result Value Ref Range   Glucose-Capillary 237 (H) 65 - 99 mg/dL    Mr Foot Left Wo Contrast  Result Date: 02/08/2017 CLINICAL DATA:  Pain and discoloration of the distal left foot. EXAM: MRI OF THE LEFT FOOT WITHOUT CONTRAST TECHNIQUE: Multiplanar, multisequence MR imaging of the left forefoot was performed. No intravenous contrast was administered. COMPARISON:  12/12/2016 FINDINGS: Bones/Joint/Cartilage Soft tissue wound along the plantar aspect of the first MTP joint. 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 7.3 x 20 mm complex fluid collection along the medial aspect of the first MTP joint concerning for an abscess. No acute fracture or dislocation. No other marrow signal abnormality. Normal alignment. No joint effusion. Ligaments Collateral ligaments are intact.  Lisfranc ligament is intact. Muscles and Tendons Flexor, peroneal and extensor compartment tendons are intact. No significant muscle atrophy. Mild diffuse increased T2 hyperintense signal throughout the muscles likely neurogenic given the patient's history diabetes versus less likely myositis. Soft tissue No other fluid collection or hematoma.  No soft tissue mass. IMPRESSION: Soft tissue wound along the  plantar aspect of the first MTP joint. 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 7.3 x 20 mm abscess along the medial aspect of the first MTP joint. Electronically Signed   By: Kathreen Devoid   On: 02/08/2017 08:25    ROS:  Review of systems not obtained due to patient factors.  Blood pressure (!) 143/50, pulse 62, temperature 97.7 F (36.5 C), resp. rate 20, height '5\' 10"'  (1.778 m), weight 238 lb 1.6 oz (108 kg), SpO2 97 %. Physical Exam: Pleasant well-developed well-nourished white male in no acute distress Head is normocephalic, atraumatic Neck is supple Lungs clear auscultation with equal breath sounds bilaterally Heart examination reveals a regular rate and rhythm without S3, S4, murmurs Extremity examination reveals no significant palpable bilateral femoral pulses. The left foot is warm with some erythema and swelling of the right great toe. No open ulcerations are seen. Rickey could not palpate a dorsalis pedis or posterior tibial pulse. Minimal sensation noted in the foot.  Arterial segmental Doppler study from April 2018 reviewed  Assessment/Plan: Impression: Osteomyelitis of left great toe, significant history of peripheral vascular disease with changes noted on segmental arterial Doppler study. Plan: Advised transfer to [facility] for further workup of his peripheral vascular disease. This needs to be assessed prior to any surgical intervention on the left foot.  Aviva Signs 02/09/2017, 7:53 AM

## 2017-02-09 NOTE — Patient Outreach (Signed)
[facility]) Care Management  02/09/2017  [name] Dec 15, 1945 [id number]   Care Coordination [facility] CM left spoke with [name] who confirms he has been hospitalized at [facility] since 02/07/17 when he went to wound care center, sent to Dr Gerarda Fraction, pcp and then was sent to Hospital. Reports his left foot and "big toe" are swollen and red but "I have been walking on it to go back and forth to the bathroom.  It does not hurt.  I can feel when it is touched and wiggle my toes.  They talking about taking my foot off but I don't want that". He reports being given "lots of antibiotics", and "I was told I have a bad infection." THN discussed cellulitis and osteomyelitis but [name] can not recall if providers have mentioned either other than "infection"  He Reports being transferred to "[facility] today"  He reports driving himself to wound care so his niece has come to [place] to get his keys and wallet.  "I don't want to stay long. I got bills to pay"  When encouraged to have niece assist with paying his bills online he reports not having a "card.  My check is mailed to me."  Encouraged him to follow the hospital treatment plan and to voice how he feels to his medical providers. [name] [facility] CM for following up with him. He reports having obtained test strips for his glucometer at home and not needing a new glucometer THN CM reviewed noted in EPIC - Seen by DM educator and Lantus increased to 40 u with meal insulin coverage for uncontrolled DM (cbgs in 200s at hospital), MD states imaging now show osteomyelitis (was not seen during previous admission in April 2018) Echo in March 2018 with an ejection fraction of 55% and grade 1 diastolic dysfunction started on vancomycin and cefepime, podiatry consultation was requested however they are unable to see him as an inpatient. -Gen. surgery consultation has been requested today. -Holding Eliquis pending surgical  exploration and possibly amputation per notes    Plans:   [facility] CM will continue to monitor pt hospital stay and contact a discharge for follow up services   [place]. Lavina Hamman, RN, BSN, [place] Care Management [phone]

## 2017-02-09 NOTE — Progress Notes (Signed)
PROGRESS NOTE    MERCER PEIFER  XTK:[id number] DOB: [birth year] DOA: 02/07/2017 PCP: Redmond School, MD     Brief Narrative:  71 year old man admitted to the hospital on 6/26 from his PCPs office due to worsening edema and erythema of his left foot. MRI shows evidence for osteomyelitis, septic arthritis and an abscess along the aspect of the first MTP joint and admission has been requested. Seen by general surgery today who is recommending transfer to [facility] for evaluation by vascular surgery for potential revascularization and amputation.   Assessment & Plan:   Active Problems:   S/P CABG x 3 - 2010   S/P AVR-tissue, 2010   Dyslipidemia   Obesity (BMI 30-39.9)   Peripheral arterial occlusive disease (HCC)   PVD (peripheral vascular disease) (HCC)   A-fib (HCC)   CKD (chronic kidney disease), stage III   Diabetic foot infection ([place])   Type 2 diabetes mellitus with left diabetic foot infection ([place])   Uncontrolled type 2 diabetes mellitus with hyperglycemia, with long-term current use of insulin (HCC)   Left foot osteomyelitis -MRI shows severe extensive bone destruction of the distal half of the first metatarsal and base of the first proximal phalanx centered around the first MTP joint most consistent with septic arthritis and osteomyelitis as well as an abscess along the medial aspect of the first MTP joint. -Interestingly he was admitted to the hospital at the end of April for cellulitis of the same foot, at that time MRI did not show evidence for osteomyelitis and he was discharged home on a course of doxycycline. -He has been started on vancomycin and cefepime, podiatry consultation was requested however they are unable to see him as an inpatient. -Gen. surgery consultation has been requested today. After evaluation they are recommending evaluation by vascular surgery. Have discussed with Dr. Donzetta Matters who agrees to see patient upon arrival. -Will hold Eliquis as he  will likely need surgical exploration and possibly amputation.  Peripheral vascular disease -04/23/15--right lower extremity arteriogram with recanalization of the right SFA occlusion and angioplasty/stenting of the right SFA and popliteal artery -10/07/15--right atherectomy and drug-eluting balloon angioplasty of the distal SFA/popliteal stenosis as well as atherectomy and angioplasty of a peroneal artery stenosis -12/12/16--ABI were 0.41 on the right and 0.62 on the left, previously 0.66 and 0.61 respectively -Vascular to see,  Paroxysmal A. Fib -Currently in sinus rhythm and rate controlled -Continue amiodarone and metoprolol, will hold Eliquis given high likelihood of surgical intervention.  Type 2 diabetes -CBGs are uncontrolled. -We will further increase Lantus to 45 units.  Hyperlipidemia  -continue Lovaza  Hypothyroidism -Continue Synthroid  Stage III chronic kidney disease -Baseline creatinine between 1.2 and 1.5 -Currently at baseline  Chronic diastolic CHF -Echo in March 2018 with an ejection fraction of 55% and grade 1 diastolic dysfunction. -Is clinically euvolemic.    DVT prophylaxis: SCDs Code Status: full code Family Communication: patient only Disposition Plan: Transfer to [facility] for [facility] surgery consultation  Consultants:   Surgery  Procedures:   None  Antimicrobials:  Anti-infectives    Start     Dose/Rate Route Frequency Ordered Stop   02/09/17 2200  ceFEPIme (MAXIPIME) 2 g in dextrose 5 % 50 mL IVPB     2 g 100 mL/hr over 30 Minutes Intravenous Every 12 hours 02/09/17 0930     02/08/17 1000  ceFEPIme (MAXIPIME) 2 g in dextrose 5 % 50 mL IVPB  Status:  Discontinued     2 g  100 mL/hr over 30 Minutes Intravenous Every 24 hours 02/07/17 1644 02/09/17 0930   02/08/17 0600  vancomycin (VANCOCIN) IVPB 1000 mg/200 mL premix     1,000 mg 200 mL/hr over 60 Minutes Intravenous Every 12 hours 02/07/17 1643     02/07/17 1445  vancomycin (VANCOCIN)  1,500 mg in sodium chloride 0.9 % 500 mL IVPB     1,500 mg 250 mL/hr over 120 Minutes Intravenous  Once 02/07/17 1433 02/08/17 0009   02/07/17 1445  ceFEPIme (MAXIPIME) 2 g in dextrose 5 % 50 mL IVPB     2 g 100 mL/hr over 30 Minutes Intravenous  Once 02/07/17 1433 02/07/17 2200       Subjective: Sitting in chair, pleasant, does not complain of pain of the foot  Objective: Vitals:   02/08/17 2010 02/08/17 2241 02/09/17 0440 02/09/17 1705  BP:  (!) 141/49 (!) 143/50 (!) 158/59  Pulse:  65 62 66  Resp:    18  Temp:  97.6 F (36.4 C) 97.7 F (36.5 C) 97.6 F (36.4 C)  TempSrc:    Oral  SpO2: 96% 96% 97% 98%  Weight:   108 kg (238 lb 1.6 oz)   Height:        Intake/Output Summary (Last 24 hours) at 02/09/17 1738 Last data filed at 02/09/17 1300  Gross per 24 hour  Intake              880 ml  Output              400 ml  Net              480 ml   Filed Weights   02/07/17 1335 02/08/17 0500 02/09/17 0440  Weight: 106.6 kg (235 lb) 106.6 kg (235 lb) 108 kg (238 lb 1.6 oz)    Examination:  General exam: Alert, awake, oriented x 3 Respiratory system: Clear to auscultation. Respiratory effort normal. Cardiovascular system:RRR. No murmurs, rubs, gallops. Gastrointestinal system: Abdomen is nondistended, soft and nontender. No organomegaly or masses felt. Normal bowel sounds heard. Central nervous system: Alert and oriented. No focal neurological deficits. Extremities: Edema and erythema surrounding the first toe on the left extending into the midfoot Skin: No rashes, lesions or ulcers Psychiatry: Judgement and insight appear normal. Mood & affect appropriate.      Data Reviewed: I have personally reviewed following labs and imaging studies  CBC:  Recent Labs Lab 02/07/17 1540 02/08/17 0552 02/09/17 0541  WBC 7.4 7.0 6.4  HGB 11.9* 11.2* 11.1*  HCT 36.7* 35.4* 34.2*  MCV 88.0 88.5 86.6  PLT 252 234 892   Basic Metabolic Panel:  Recent Labs Lab  02/07/17 1540 02/08/17 0552 02/09/17 0541  NA 136 137 135  K 4.1 4.3 4.2  CL 100* 102 100*  CO2 26 27 28   GLUCOSE 220* 218* 230*  BUN 24* 21* 20  CREATININE 1.58* 1.34* 1.37*  CALCIUM 9.1 8.7* 8.8*   GFR: Estimated Creatinine Clearance: 63.5 mL/min (A) (by C-G formula based on SCr of 1.37 mg/dL (H)). Liver Function Tests: No results for input(s): AST, ALT, ALKPHOS, BILITOT, PROT, ALBUMIN in the last 168 hours. No results for input(s): LIPASE, AMYLASE in the last 168 hours. No results for input(s): AMMONIA in the last 168 hours. Coagulation Profile: No results for input(s): INR, PROTIME in the last 168 hours. Cardiac Enzymes: No results for input(s): CKTOTAL, CKMB, CKMBINDEX, TROPONINI in the last 168 hours. BNP (last 3 results) No results for input(s): PROBNP  in the last 8760 hours. HbA1C: No results for input(s): HGBA1C in the last 72 hours. CBG:  Recent Labs Lab 02/08/17 1659 02/08/17 2232 02/09/17 0733 02/09/17 1111 02/09/17 1708  GLUCAP 242* 284* 237* 209* 271*   Lipid Profile: No results for input(s): CHOL, HDL, LDLCALC, TRIG, CHOLHDL, LDLDIRECT in the last 72 hours. Thyroid Function Tests: No results for input(s): TSH, T4TOTAL, FREET4, T3FREE, THYROIDAB in the last 72 hours. Anemia Panel: No results for input(s): VITAMINB12, FOLATE, FERRITIN, TIBC, IRON, RETICCTPCT in the last 72 hours. Urine analysis:    Component Value Date/Time   COLORURINE STRAW (A) 02/23/2016 2140   APPEARANCEUR CLEAR 02/23/2016 2140   LABSPEC 1.023 02/23/2016 2140   PHURINE 7.0 02/23/2016 2140   GLUCOSEU >1000 (A) 02/23/2016 2140   HGBUR NEGATIVE 02/23/2016 2140   BILIRUBINUR NEGATIVE 02/23/2016 2140   KETONESUR NEGATIVE 02/23/2016 2140   PROTEINUR NEGATIVE 02/23/2016 2140   UROBILINOGEN 0.2 11/25/2013 1741   NITRITE NEGATIVE 02/23/2016 2140   LEUKOCYTESUR NEGATIVE 02/23/2016 2140   Sepsis Labs: @LABRCNTIP (procalcitonin:4,lacticidven:4)  ) Recent Results (from the past 240  hour(s))  Culture, blood (Routine X 2) w Reflex to ID Panel     Status: None (Preliminary result)   Collection Time: 02/07/17  3:40 PM  Result Value Ref Range Status   Specimen Description   Final    RIGHT ANTECUBITAL BOTTLES DRAWN AEROBIC AND ANAEROBIC   Special Requests Blood Culture adequate volume  Final   Culture NO GROWTH 2 DAYS  Final   Report Status PENDING  Incomplete  Culture, blood (Routine X 2) w Reflex to ID Panel     Status: None (Preliminary result)   Collection Time: 02/07/17  3:40 PM  Result Value Ref Range Status   Specimen Description   Final    RIGHT ANTECUBITAL BOTTLES DRAWN AEROBIC AND ANAEROBIC   Special Requests   Final    Blood Culture results may not be optimal due to an inadequate volume of blood received in culture bottles   Culture NO GROWTH 2 DAYS  Final   Report Status PENDING  Incomplete         Radiology Studies: No results found.      Scheduled Meds: . alprazolam  1 mg Oral TID  . amiodarone  200 mg Oral Daily  . atorvastatin  20 mg Oral q1800  . docusate sodium  100 mg Oral BID  . fenofibrate  160 mg Oral Daily  . ferrous sulfate  325 mg Oral Q breakfast  . gabapentin  300 mg Oral BID  . insulin aspart  0-20 Units Subcutaneous TID WC  . insulin aspart  0-5 Units Subcutaneous QHS  . insulin aspart  4 Units Subcutaneous TID WC  . insulin glargine  40 Units Subcutaneous QHS  . levothyroxine  25 mcg Oral QAC breakfast  . metoprolol succinate  25 mg Oral Daily  . omega-3 acid ethyl esters  1 g Oral Daily  . pantoprazole  40 mg Oral BID  . torsemide  10 mg Oral Daily   Continuous Infusions: . ceFEPime (MAXIPIME) IV    . vancomycin Stopped (02/09/17 0750)     LOS: 2 days    Time spent: 30 minutes. Greater than 50% of this time was spent in direct contact with the patient coordinating care.     Lelon Frohlich, MD [facility] Pager [phone]  If 7PM-7AM, please contact  night-coverage [email or web address] Password [facility] 02/09/2017, 5:38 PM

## 2017-02-10 ENCOUNTER — Encounter (HOSPITAL_COMMUNITY): Payer: Self-pay | Admitting: Interventional Radiology

## 2017-02-10 ENCOUNTER — Other Ambulatory Visit: Payer: Self-pay | Admitting: *Deleted

## 2017-02-10 ENCOUNTER — Inpatient Hospital Stay (HOSPITAL_COMMUNITY): Payer: Medicare Other

## 2017-02-10 DIAGNOSIS — M86071 Acute hematogenous osteomyelitis, right ankle and foot: Secondary | ICD-10-CM

## 2017-02-10 HISTORY — PX: IR FEM POP ART ATHERECT INC PTA MOD SED: IMG2310

## 2017-02-10 HISTORY — PX: IR ANGIOGRAM EXTREMITY LEFT: IMG651

## 2017-02-10 HISTORY — PX: IR US GUIDE VASC ACCESS RIGHT: IMG2390

## 2017-02-10 LAB — CBC
HEMATOCRIT: 39.8 % (ref 39.0–52.0)
Hemoglobin: 12.6 g/dL — ABNORMAL LOW (ref 13.0–17.0)
MCH: 27.7 pg (ref 26.0–34.0)
MCHC: 31.7 g/dL (ref 30.0–36.0)
MCV: 87.5 fL (ref 78.0–100.0)
Platelets: 243 10*3/uL (ref 150–400)
RBC: 4.55 MIL/uL (ref 4.22–5.81)
RDW: 13.5 % (ref 11.5–15.5)
WBC: 6.5 10*3/uL (ref 4.0–10.5)

## 2017-02-10 LAB — BASIC METABOLIC PANEL
Anion gap: 9 (ref 5–15)
BUN: 17 mg/dL (ref 6–20)
CHLORIDE: 102 mmol/L (ref 101–111)
CO2: 26 mmol/L (ref 22–32)
Calcium: 9.6 mg/dL (ref 8.9–10.3)
Creatinine, Ser: 1.23 mg/dL (ref 0.61–1.24)
GFR calc Af Amer: >60 mL/min (ref >60)
GFR calc non Af Amer: 59 mL/min — ABNORMAL LOW (ref >60)
Glucose, Bld: 198 mg/dL — ABNORMAL HIGH (ref 65–99)
POTASSIUM: 4.7 mmol/L (ref 3.5–5.1)
SODIUM: 137 mmol/L (ref 135–145)

## 2017-02-10 LAB — HEMOGLOBIN A1C
Hgb A1c MFr Bld: 6.9 % — ABNORMAL HIGH (ref 4.8–5.6)
Mean Plasma Glucose: 151 mg/dL

## 2017-02-10 LAB — GLUCOSE, CAPILLARY
GLUCOSE-CAPILLARY: 270 mg/dL — AB (ref 65–99)
Glucose-Capillary: 311 mg/dL — ABNORMAL HIGH (ref 65–99)
Glucose-Capillary: 164 mg/dL — ABNORMAL HIGH (ref 65–99)
Glucose-Capillary: 182 mg/dL — ABNORMAL HIGH (ref 65–99)

## 2017-02-10 LAB — PROTIME-INR
INR: 1.08
Prothrombin Time: 14 seconds (ref 11.4–15.2)

## 2017-02-10 LAB — URIC ACID: Uric Acid, Serum: 5.4 mg/dL (ref 4.4–7.6)

## 2017-02-10 LAB — VANCOMYCIN, TROUGH: VANCOMYCIN TR: 18 ug/mL (ref 15–20)

## 2017-02-10 MED ORDER — IODIXANOL 320 MG/ML IV SOLN
100.0000 mL | Freq: Once | INTRAVENOUS | Status: AC | PRN
  Administered 2017-02-10: 25 mL via INTRA_ARTERIAL

## 2017-02-10 MED ORDER — IODIXANOL 320 MG/ML IV SOLN
100.0000 mL | Freq: Once | INTRAVENOUS | Status: AC | PRN
  Administered 2017-02-10: 75 mL via INTRA_ARTERIAL

## 2017-02-10 MED ORDER — HEPARIN SODIUM (PORCINE) 1000 UNIT/ML IJ SOLN
INTRAMUSCULAR | Status: AC | PRN
  Administered 2017-02-10: 8000 [IU] via INTRAVENOUS

## 2017-02-10 MED ORDER — MIDAZOLAM HCL 2 MG/2ML IJ SOLN
INTRAMUSCULAR | Status: AC
  Filled 2017-02-10: qty 4

## 2017-02-10 MED ORDER — FENTANYL CITRATE (PF) 100 MCG/2ML IJ SOLN
INTRAMUSCULAR | Status: AC
  Filled 2017-02-10: qty 4

## 2017-02-10 MED ORDER — LIDOCAINE HCL 1 % IJ SOLN
INTRAMUSCULAR | Status: AC | PRN
  Administered 2017-02-10: 10 mL

## 2017-02-10 MED ORDER — NITROGLYCERIN 1 MG/10 ML FOR IR/CATH LAB
INTRA_ARTERIAL | Status: AC
  Filled 2017-02-10: qty 10

## 2017-02-10 MED ORDER — FENTANYL CITRATE (PF) 100 MCG/2ML IJ SOLN
INTRAMUSCULAR | Status: AC | PRN
  Administered 2017-02-10 (×2): 50 ug via INTRAVENOUS
  Administered 2017-02-10: 25 ug via INTRAVENOUS
  Administered 2017-02-10: 50 ug via INTRAVENOUS

## 2017-02-10 MED ORDER — HEPARIN SODIUM (PORCINE) 1000 UNIT/ML IJ SOLN
INTRAMUSCULAR | Status: AC
  Filled 2017-02-10: qty 1

## 2017-02-10 MED ORDER — SODIUM CHLORIDE 0.9 % IJ SOLN
INTRAVENOUS | Status: AC | PRN
  Administered 2017-02-10 (×11): 100 ug via INTRA_ARTERIAL

## 2017-02-10 MED ORDER — LIDOCAINE HCL (PF) 1 % IJ SOLN
INTRAMUSCULAR | Status: AC
  Filled 2017-02-10: qty 30

## 2017-02-10 MED ORDER — MIDAZOLAM HCL 2 MG/2ML IJ SOLN
INTRAMUSCULAR | Status: AC | PRN
  Administered 2017-02-10: 0.5 mg via INTRAVENOUS
  Administered 2017-02-10 (×3): 1 mg via INTRAVENOUS

## 2017-02-10 NOTE — Sedation Documentation (Signed)
Patient is resting comfortably. 

## 2017-02-10 NOTE — Progress Notes (Signed)
Inpatient Diabetes Program Recommendations  AACE/ADA: New Consensus Statement on Inpatient Glycemic Control (2015)  Target Ranges:  Prepandial:   less than 140 mg/dL      Peak postprandial:   less than 180 mg/dL (1-2 hours)      Critically ill patients:  140 - 180 mg/dL   Lab Results  Component Value Date   GLUCAP 311 (H) 02/10/2017   HGBA1C 6.9 (H) 02/07/2017    Review of Glycemic Control  Diabetes history:DM2 Outpatient Diabetes medications: Lantus 75 units QAM, Lantus 60 units QHS, Humalog 35 units with breakfast, Humalog 50 units with lunch and supper, Invokana 100 mg daily Current orders for Inpatient glycemic control: Lantus 45 units QHS, Novolog 0-20 units TID with meals, Novolog 0-5 units QHS, Novolog 4 units TID with meals  Inpatient Diabetes Program Recommendations: Insulin - Basal: Please consider increasing Lantus to 30 units BID (starting now). Insulin - Meal Coverage: Please consider increasing meal coverage to Novolog 10 units TID with meals.  Thanks, Barnie Alderman, RN, MSN, CDE Diabetes Coordinator Inpatient Diabetes Program [phone] (Team Pager from 8am to 5pm)

## 2017-02-10 NOTE — Consult Note (Signed)
Chief Complaint: Patient was seen in consultation today for left lower extremity arteriogram with possible revascularization intervention at the request of Drr E Patrecia Pour  Referring Physician(s): Dr Patrecia Pour  Supervising Physician: Jacqulynn Cadet  Patient Status: [facility] - In-pt  History of Present Illness: [name] is a 71 y.o. male   Known to VIR: 01/04/17 note: right lower extremity arteriogram with recanalization of a right SFA occlusion and angioplasty/stenting of the right SFA and popliteal artery on 04/23/15; atherectomy and drug-eluting balloon angioplasty of distal SFA/popliteal stenosis as well as atherectomy and angioplasty of peroneal artery stenosis on 10/07/15. The patient's prior right great toe wound has completely healed following our successful revascularization procedures. Currently, he has a wound on the plantar aspect of the left foot in the setting of known left lower extremity peripheral arterial disease.  His wound is fairly mild and has been progressing with wound care. We discussed the options of proceeding with revascularization now to allow the maximal transfer healing versus taking a wait and see approach. [name] prefers to continue with wound care for now and if healing is unsuccessful proceed with an intervention at that time.  Admitted to [facility] 6/26 with left foot edema for 2 weeks Has been transferred to [facility] for evaluation and possible treatment with Dr Laurence Ferrari  Pt states his foot is not painful Swelling and redness now 2-3 weeks Can stand and walk slowly Denies fever/chills  Request for evaluation per [facility] and Surgery services Last dose Eliquis 6/27 1000 am  Past Medical History:  Diagnosis Date  . At risk for sudden cardiac death 06-22-2013  . Cataract   . Chronic anticoagulation 05/2013   started  . Chronic kidney disease    Patient reports he is seeing Kidney doctor in September  . Coronary artery disease   . Diabetes  mellitus    type 2   . Dyslipidemia   . GERD (gastroesophageal reflux disease)   . History of valve replacement 2010   [facility] Ease bioprosthetic 31mm  . Hypertension   . PAF (paroxysmal atrial fibrillation) ([place]) 05/2013   converted with amiodarone to SR  . S/P CABG x 3 2010  . S/P cardiac catheterization, Rt & Lt heart cath 06/05/2013 2013/06/22  . Stroke [facility]) 2003   R-sided weakness & upper extremity swelling   . Thyroid disease     Past Surgical History:  Procedure Laterality Date  . AORTIC VALVE REPLACEMENT  01/26/2009   [facility] Ease bioprosthetic 52mm valve  . CARDIAC CATHETERIZATION  06/05/2013   native 3 vessel disease; patent LIMA to LAD, SVG to OM and SVG to PDA; Elevated right and left heart filling pressures, with predominantly pulmonary venous hypertension, normal PVR  . COLONOSCOPY  2007   Dr. Aviva Signs: internal hemorrhoids  . COLONOSCOPY N/A 06/16/2016   Procedure: COLONOSCOPY;  Surgeon: Rogene Houston, MD;  Location: [facility] ENDO SUITE;  Service: Endoscopy;  Laterality: N/A;  1:45  . CORONARY ARTERY BYPASS GRAFT  01/26/2009   LIMA to LAD, SVG to circumflex, SVG to PDA  . GRAFT(S) ANGIOGRAM  06/05/2013   Procedure: GRAFT(S) Cyril Loosen;  Surgeon: Leonie Man, MD;  Location: [facility] CATH LAB;  Service: Cardiovascular;;  . IR GENERIC HISTORICAL  05/12/2016   IR RADIOLOGIST EVAL & MGMT 05/12/2016 Jacqulynn Cadet, MD [facility] INTERV RAD  . IR RADIOLOGIST EVAL & MGMT  01/04/2017  . LEFT AND RIGHT HEART CATHETERIZATION WITH CORONARY ANGIOGRAM N/A 06/05/2013   Procedure: LEFT  AND RIGHT HEART CATHETERIZATION WITH CORONARY ANGIOGRAM;  Surgeon: Leonie Man, MD;  Location: [facility] CATH LAB;  Service: Cardiovascular;  Laterality: N/A;  . TRANSTHORACIC ECHOCARDIOGRAM  09/2011   grade 1 diastolic dysfunction; increasing valve gradient; calcified MV annulus   . TRANSTHORACIC ECHOCARDIOGRAM  06/03/2013   EF 25-30%, mod conc. hypertrophy, grade 3 diastolic dysfunction; LA mod  dilated; calcified MV annulus; transaortic gradients are normal for the bioprosthetic valve; inf vena cava dilated (elevated CVP) - LifeVest    Allergies: Patient has no known allergies.  Medications: Prior to Admission medications   Medication Sig Start Date End Date Taking? Authorizing Provider  acetaminophen (TYLENOL) 500 MG tablet Take 1,000 mg by mouth 2 (two) times daily as needed for headache. For pain/headaches   Yes [provider]  alprazolam Duanne Moron) 2 MG tablet Take 1 mg by mouth 3 (three) times daily.    Yes [provider]  amiodarone (PACERONE) 200 MG tablet Take 1 tablet (200 mg total) by mouth daily. 10/14/16  Yes Hilty, Nadean Corwin, MD  apixaban (ELIQUIS) 5 MG TABS tablet Take 1 tablet (5 mg total) by mouth 2 (two) times daily. 10/14/16  Yes Hilty, Nadean Corwin, MD  atorvastatin (LIPITOR) 20 MG tablet Take 1 tablet (20 mg total) by mouth daily at 6 PM. 06/18/16  Yes Rehman, Mechele Dawley, MD  fenofibrate (TRICOR) 145 MG tablet TAKE ONE TABLET EACH DAY. 11/01/16  Yes Hilty, Nadean Corwin, MD  ferrous sulfate 325 (65 FE) MG EC tablet Take 325 mg by mouth daily with breakfast.   Yes [provider]  gabapentin (NEURONTIN) 300 MG capsule Take 300 mg by mouth 2 (two) times daily.    Yes [provider]  insulin glargine (LANTUS) 100 UNIT/ML injection Inject 0.6-0.75 mLs (60-75 Units total) into the skin 2 (two) times daily. 75 units in the morning then 60 units at bedtime 02/25/16  Yes Hongalgi, Lenis Dickinson, MD  insulin lispro (HUMALOG) 100 UNIT/ML injection Inject 0.35-0.5 mLs (35-50 Units total) into the skin 3 (three) times daily before meals. Patient states that he takes 35 units at breakfast, 50 units at dinner and 50 units at supper. Patient taking differently: Inject 35-45 Units into the skin 3 (three) times daily before meals. Patient states that he takes 35 units at breakfast, 50 units at dinner and 50 units at supper. 02/25/16  Yes Hongalgi, Lenis Dickinson, MD  INVOKANA  100 MG TABS tablet Take 1 tablet by mouth daily. 12/09/16  Yes [provider]  levothyroxine (SYNTHROID, LEVOTHROID) 25 MCG tablet Take 25 mcg by mouth daily before breakfast.   Yes [provider]  meclizine (ANTIVERT) 25 MG tablet Take 25 mg by mouth 3 (three) times daily as needed for dizziness.   Yes [provider]  metoprolol succinate (TOPROL-XL) 25 MG 24 hr tablet Take 1 tablet (25 mg total) by mouth daily. 10/14/16  Yes Hilty, Nadean Corwin, MD  Omega-3 Fatty Acids (FISH OIL) 1200 MG CAPS Take 1 capsule by mouth daily.    Yes [provider]  pantoprazole (PROTONIX) 40 MG tablet Take 1 tablet (40 mg total) by mouth 2 (two) times daily. 10/30/14  Yes Hilty, Nadean Corwin, MD  silver sulfADIAZINE (SILVADENE) 1 % cream Apply 1 application topically daily.   Yes [provider]  sulfamethoxazole-trimethoprim (BACTRIM DS,SEPTRA DS) 800-160 MG tablet Take 1 tablet by mouth 2 (two) times daily.   Yes [provider]  torsemide (DEMADEX) 10 MG tablet Take 1 tablet (10 mg  total) by mouth daily. 10/14/16  Yes Hilty, Nadean Corwin, MD  amoxicillin-clavulanate (AUGMENTIN) 875-125 MG tablet Take 1 tablet by mouth 2 (two) times daily.  01/24/17   [provider]     Family History  Problem Relation Age of Onset  . Heart attack Mother   . Liver disease Brother   . Diabetes Sister        x4    Social History   Social History  . Marital status: Divorced    Spouse name: N/A  . Number of children: 4  . Years of education: N/A   Occupational History  . 4 Retired   Social History Main Topics  . Smoking status: Former Smoker    Types: Cigarettes    Quit date: 08/15/2001  . Smokeless tobacco: Former Systems developer    Types: Chew     Comment: Quit in 2003  . Alcohol use No  . Drug use: No  . Sexual activity: Not Currently   Other Topics Concern  . None   Social History Narrative  . None    Review of Systems: A 12 point ROS discussed and pertinent  positives are indicated in the HPI above.  All other systems are negative.  Review of Systems  Constitutional: Positive for activity change. Negative for appetite change, fatigue and fever.  Respiratory: Negative for shortness of breath.   Cardiovascular: Negative for chest pain.  Gastrointestinal: Negative for abdominal pain.  Musculoskeletal: Positive for gait problem.  Neurological: Positive for weakness.  Psychiatric/Behavioral: Negative for behavioral problems and confusion.    Vital Signs: BP (!) 144/57   Pulse 69   Temp 98.4 F (36.9 C)   Resp 17   Ht 5\' 10"  (1.778 m)   Wt 238 lb 1.6 oz (108 kg)   SpO2 96%   BMI 34.16 kg/m   Physical Exam  Constitutional: He is oriented to person, place, and time. He appears well-nourished.  Cardiovascular: Normal rate, regular rhythm and normal heart sounds.   Pulmonary/Chest: Effort normal. He has no wheezes.  Abdominal: Soft. Bowel sounds are normal.  Musculoskeletal: Normal range of motion.  Left foot has reddened are from 1st tor to middle of foot NT no signs of obvious sore or lesion  Neurological: He is alert and oriented to person, place, and time.  Skin: Skin is warm and dry.  Psychiatric: He has a normal mood and affect. His behavior is normal. Judgment and thought content normal.  Pt able to sign consent with X and initials (weak right arm/hand)  Nursing note and vitals reviewed.   Mallampati Score:  MD Evaluation Airway: WNL Heart: WNL Abdomen: WNL Chest/ Lungs: WNL ASA  Classification: 3 Mallampati/Airway Score: One  Imaging: Mr Foot Left Wo Contrast  Result Date: 02/08/2017 CLINICAL DATA:  Pain and discoloration of the distal left foot. EXAM: MRI OF THE LEFT FOOT WITHOUT CONTRAST TECHNIQUE: Multiplanar, multisequence MR imaging of the left forefoot was performed. No intravenous contrast was administered. COMPARISON:  12/12/2016 FINDINGS: Bones/Joint/Cartilage Soft tissue wound along the plantar aspect of the  first MTP joint. 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 7.3 x 20 mm complex fluid collection along the medial aspect of the first MTP joint concerning for an abscess. No acute fracture or dislocation. No other marrow signal abnormality. Normal alignment. No joint effusion. Ligaments Collateral ligaments are intact.  Lisfranc ligament is intact. Muscles and Tendons Flexor, peroneal and extensor compartment tendons are intact. No significant muscle atrophy. Mild diffuse increased T2 hyperintense signal throughout the muscles likely neurogenic given the patient's history diabetes versus less likely myositis. Soft tissue No other fluid collection or hematoma.  No soft tissue mass. IMPRESSION: Soft tissue wound along the plantar aspect of the first MTP joint. 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 7.3 x 20 mm abscess along the medial aspect of the first MTP joint. Electronically Signed   By: Kathreen Devoid   On: 02/08/2017 08:25    Labs:  CBC:  Recent Labs  12/13/16 0534 02/07/17 1540 02/08/17 0552 02/09/17 0541  WBC 7.7 7.4 7.0 6.4  HGB 12.9* 11.9* 11.2* 11.1*  HCT 37.7* 36.7* 35.4* 34.2*  PLT 244 252 234 209    COAGS:  Recent Labs  02/23/16 1956 02/10/17 0413  INR 1.20 1.08  APTT 33  --     BMP:  Recent Labs  12/14/16 0531 02/07/17 1540 02/08/17 0552 02/09/17 0541  NA 133* 136 137 135  K 4.4 4.1 4.3 4.2  CL 103 100* 102 100*  CO2 23 26 27 28   GLUCOSE 206* 220* 218* 230*  BUN 20 24* 21* 20  CALCIUM 8.8* 9.1 8.7* 8.8*  CREATININE 1.38* 1.58* 1.34* 1.37*  GFRNONAA 51* 43* 53* 51*  GFRAA 59* 50* >60 60*    LIVER FUNCTION TESTS:  Recent  Labs  02/23/16 1956 04/14/16 1059 12/12/16 1259  BILITOT 0.8 0.5 0.4  AST 52* 41* 29  ALT 62 52* 29  ALKPHOS 43 39* 48  PROT 6.2* 6.6 7.0  ALBUMIN 4.0 4.3 3.8    TUMOR MARKERS: No results for input(s): AFPTM, CEA, CA199, CHROMGRNA in the last 8760 hours.  Assessment and Plan:  Known to VIR Hx RLE revascularization 04/2015 and 09/2015 Left foot redness and swelling x 2 wks Now scheduled for right lower extremity arteriogram with possible revascularization intrvention Risks and Benefits discussed with the patient including, but not limited to bleeding, infection, vascular injury or contrast induced renal failure. All of the patient's questions were answered, patient is agreeable to proceed. Consent signed and in chart.  Thank you for this interesting consult.  I greatly enjoyed meeting [name] and look forward to participating in their care.  A copy of this report was sent to the requesting provider on this date.  Electronically Signed: Lavonia Drafts, PA-C 02/10/2017, 8:29 AM   I spent a total of 40 Minutes    in face to face in clinical consultation, greater than 50% of which was counseling/coordinating care for left extremity arteriogram with poss intervention

## 2017-02-10 NOTE — Consult Note (Signed)
ORTHOPAEDIC CONSULTATION  REQUESTING PHYSICIAN: Patrecia Pour, Christean Grief, MD  Chief Complaint: Pain and swelling and drainage right great toe MTP joint.  HPI: [name] is a 71 y.o. male who presents with a several week history of swelling and ulcer right great toe. Patient was initially admitted to [facility] and was transferred to [facility] for limb salvage intervention.  Past Medical History:  Diagnosis Date  . At risk for sudden cardiac death June 27, 2013  . Cataract   . Chronic anticoagulation 05/2013   started  . Chronic kidney disease    Patient reports he is seeing Kidney doctor in September  . Coronary artery disease   . Diabetes mellitus    type 2   . Dyslipidemia   . GERD (gastroesophageal reflux disease)   . History of valve replacement 2010   [facility] Ease bioprosthetic 60mm  . Hypertension   . PAF (paroxysmal atrial fibrillation) ([place]) 05/2013   converted with amiodarone to SR  . S/P CABG x 3 2010  . S/P cardiac catheterization, Rt & Lt heart cath 06/05/2013 06/27/2013  . Stroke [facility]) 2003   R-sided weakness & upper extremity swelling   . Thyroid disease    Past Surgical History:  Procedure Laterality Date  . AORTIC VALVE REPLACEMENT  01/26/2009   [facility] Ease bioprosthetic 73mm valve  . CARDIAC CATHETERIZATION  06/05/2013   native 3 vessel disease; patent LIMA to LAD, SVG to OM and SVG to PDA; Elevated right and left heart filling pressures, with predominantly pulmonary venous hypertension, normal PVR  . COLONOSCOPY  2007   Dr. Aviva Signs: internal hemorrhoids  . COLONOSCOPY N/A 06/16/2016   Procedure: COLONOSCOPY;  Surgeon: Rogene Houston, MD;  Location: [facility] ENDO SUITE;  Service: Endoscopy;  Laterality: N/A;  1:45  . CORONARY ARTERY BYPASS GRAFT  01/26/2009   LIMA to LAD, SVG to circumflex, SVG to PDA  . GRAFT(S) ANGIOGRAM  06/05/2013   Procedure: GRAFT(S) Cyril Loosen;  Surgeon: Leonie Man, MD;  Location: [facility] CATH LAB;  Service:  Cardiovascular;;  . IR GENERIC HISTORICAL  05/12/2016   IR RADIOLOGIST EVAL & MGMT 05/12/2016 Jacqulynn Cadet, MD [facility] INTERV RAD  . IR RADIOLOGIST EVAL & MGMT  01/04/2017  . LEFT AND RIGHT HEART CATHETERIZATION WITH CORONARY ANGIOGRAM N/A 06/05/2013   Procedure: LEFT AND RIGHT HEART CATHETERIZATION WITH CORONARY ANGIOGRAM;  Surgeon: Leonie Man, MD;  Location: [facility] CATH LAB;  Service: Cardiovascular;  Laterality: N/A;  . TRANSTHORACIC ECHOCARDIOGRAM  09/2011   grade 1 diastolic dysfunction; increasing valve gradient; calcified MV annulus   . TRANSTHORACIC ECHOCARDIOGRAM  06/03/2013   EF 25-30%, mod conc. hypertrophy, grade 3 diastolic dysfunction; LA mod dilated; calcified MV annulus; transaortic gradients are normal for the bioprosthetic valve; inf vena cava dilated (elevated CVP) - LifeVest   Social History   Social History  . Marital status: Divorced    Spouse name: N/A  . Number of children: 4  . Years of education: N/A   Occupational History  . 4 Retired   Social History Main Topics  . Smoking status: Former Smoker    Types: Cigarettes    Quit date: 08/15/2001  . Smokeless tobacco: Former Systems developer    Types: Chew     Comment: Quit in 2003  . Alcohol use No  . Drug use: No  . Sexual activity: Not Currently   Other Topics Concern  . None   Social History Narrative  . None   Family History  Problem  Relation Age of Onset  . Heart attack Mother   . Liver disease Brother   . Diabetes Sister        x4   - negative except otherwise stated in the family history section No Known Allergies Prior to Admission medications   Medication Sig Start Date End Date Taking? Authorizing Provider  acetaminophen (TYLENOL) 500 MG tablet Take 1,000 mg by mouth 2 (two) times daily as needed for headache. For pain/headaches   Yes [provider]  alprazolam Duanne Moron) 2 MG tablet Take 1 mg by mouth 3 (three) times daily.    Yes [provider]  amiodarone (PACERONE) 200 MG  tablet Take 1 tablet (200 mg total) by mouth daily. 10/14/16  Yes Hilty, Nadean Corwin, MD  apixaban (ELIQUIS) 5 MG TABS tablet Take 1 tablet (5 mg total) by mouth 2 (two) times daily. 10/14/16  Yes Hilty, Nadean Corwin, MD  atorvastatin (LIPITOR) 20 MG tablet Take 1 tablet (20 mg total) by mouth daily at 6 PM. 06/18/16  Yes Rehman, Mechele Dawley, MD  fenofibrate (TRICOR) 145 MG tablet TAKE ONE TABLET EACH DAY. 11/01/16  Yes Hilty, Nadean Corwin, MD  ferrous sulfate 325 (65 FE) MG EC tablet Take 325 mg by mouth daily with breakfast.   Yes [provider]  gabapentin (NEURONTIN) 300 MG capsule Take 300 mg by mouth 2 (two) times daily.    Yes [provider]  insulin glargine (LANTUS) 100 UNIT/ML injection Inject 0.6-0.75 mLs (60-75 Units total) into the skin 2 (two) times daily. 75 units in the morning then 60 units at bedtime 02/25/16  Yes Hongalgi, Lenis Dickinson, MD  insulin lispro (HUMALOG) 100 UNIT/ML injection Inject 0.35-0.5 mLs (35-50 Units total) into the skin 3 (three) times daily before meals. Patient states that he takes 35 units at breakfast, 50 units at dinner and 50 units at supper. Patient taking differently: Inject 35-45 Units into the skin 3 (three) times daily before meals. Patient states that he takes 35 units at breakfast, 50 units at dinner and 50 units at supper. 02/25/16  Yes Hongalgi, Lenis Dickinson, MD  INVOKANA 100 MG TABS tablet Take 1 tablet by mouth daily. 12/09/16  Yes [provider]  levothyroxine (SYNTHROID, LEVOTHROID) 25 MCG tablet Take 25 mcg by mouth daily before breakfast.   Yes [provider]  meclizine (ANTIVERT) 25 MG tablet Take 25 mg by mouth 3 (three) times daily as needed for dizziness.   Yes [provider]  metoprolol succinate (TOPROL-XL) 25 MG 24 hr tablet Take 1 tablet (25 mg total) by mouth daily. 10/14/16  Yes Hilty, Nadean Corwin, MD  Omega-3 Fatty Acids (FISH OIL) 1200 MG CAPS Take 1 capsule by mouth daily.    Yes [provider]    pantoprazole (PROTONIX) 40 MG tablet Take 1 tablet (40 mg total) by mouth 2 (two) times daily. 10/30/14  Yes Hilty, Nadean Corwin, MD  silver sulfADIAZINE (SILVADENE) 1 % cream Apply 1 application topically daily.   Yes [provider]  sulfamethoxazole-trimethoprim (BACTRIM DS,SEPTRA DS) 800-160 MG tablet Take 1 tablet by mouth 2 (two) times daily.   Yes [provider]  torsemide (DEMADEX) 10 MG tablet Take 1 tablet (10 mg total) by mouth daily. 10/14/16  Yes Hilty, Nadean Corwin, MD  amoxicillin-clavulanate (AUGMENTIN) 875-125 MG tablet Take 1 tablet by mouth 2 (two) times daily.  01/24/17   [provider]   No results found. - pertinent xrays, CT, MRI studies were reviewed and independently interpreted  Positive ROS: All other systems have been reviewed and were otherwise negative with the exception of those mentioned in the HPI and as above.  Physical Exam: General: Alert, no acute distress Psychiatric: Patient is competent for consent with normal mood and affect Lymphatic: No axillary or cervical lymphadenopathy Cardiovascular: No pedal edema Respiratory: No cyanosis, no use of accessory musculature GI: No organomegaly, abdomen is soft and non-tender  Skin: Patient has an ulcer medially of the right great toe. There is swelling and fluctuance and crepitation range of motion.   Neurologic: Patient does not have protective sensation bilateral lower extremities.   MUSCULOSKELETAL:  Examination patient does not have a palpable dorsalis pedis pulse there is no ascending cellulitis patient has no pain with range of motion of the great toe. Review of the MRI scan is suggestive of a septic arthritis of the right great toe MTP joint.  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 I will follow-up on  Monday for further evaluation. Patient most likely will require a first ray amputation and would plan on surgical intervention pending the results of the endovascular procedure.  Thank you for the consult and the opportunity to see [name], [place] [phone] 4:24 PM

## 2017-02-10 NOTE — Patient Outreach (Addendum)
[facility]) Care Management  02/10/2017  [name] [birth year] [id number]   Care coordination   [facility] hospital liaison aware of [name] transfer to [facility] for evaluation by vascular surgery for potential revascularization and amputation.  Plans Pt to be followed for potential needs and discharge [facility] CM had called [employer] health benefit OTC program [phone] to order a catalog to be delivered to [name] home to assist with future OTC needs He has 30 credits each quarter that does not roll over.  He was informed of this on 02/09/17.  [facility] CM also consulted with A Wendi Snipes [facility] pharmacy technician.  No available resource at this time to assist with getting further glucometers  [name] L. Lavina [place], RN, BSN, [place] Care Management [phone]

## 2017-02-10 NOTE — Procedures (Signed)
Interventional Radiology Procedure Note  Procedure:  1.) LLE angiogram 2.) Orbital atherectomy and DCB-PTA of multifocal severe stenoses throughout the SFA and popliteal arteries.   Vascular Access: Right CFA, 57F --> ExoSeal  Complications: None  Estimated Blood Loss: None  Recommendations: - Bedrest x 4 hours - Continue abx and wound care - Will require 2nd visit to IR for attempted reconstruction of tibial vessels  Signed,  Criselda Peaches, MD

## 2017-02-10 NOTE — Progress Notes (Signed)
Pharmacy Antibiotic Note  [name] is a 71 y.o. male admitted on 02/07/2017 with wound infection.  Pharmacy has been consulted for Vancomycin and Cefepime dosing.  A vancomycin trough drawn tonight is within therapeutic range at 56mcg/mL as patient has worsening abscess. Patient may require amputation depending on results of endovascular procedure he underwent today.  Plan:  Vancomycin 1000mg  IV q12h Cefepime 2g IV q24h Monitor labs, renal fxn, progress and c/s Deescalate ABX when improved / appropriate.    Height: 5\' 10"  (177.8 cm) Weight: 238 lb 1.6 oz (108 kg) IBW/kg (Calculated) : 73  Temp (24hrs), Avg:97.9 F (36.6 C), Min:97.5 F (36.4 C), Max:98.4 F (36.9 C)   Recent Labs Lab 02/07/17 1540 02/08/17 0552 02/09/17 0541 02/10/17 1111 02/10/17 1843  WBC 7.4 7.0 6.4 6.5  --   CREATININE 1.58* 1.34* 1.37* 1.23  --   VANCOTROUGH  --   --   --   --  18    Estimated Creatinine Clearance: 70.7 mL/min (by C-G formula based on SCr of 1.23 mg/dL).    No Known Allergies  Antimicrobials this admission: Vancomycin 6/26 >>  Cefepime 6/26 >>   Dose adjustments this admission: 6/29 VT = 1mcg/mL on 1g q12h > no changes  Microbiology results:  BCx: ngtd  MRSA PCR: neg  Thank you for allowing pharmacy to be a part of this patient's care.  Gorje Iyer 02/10/2017 7:55 PM

## 2017-02-10 NOTE — Progress Notes (Signed)
PROGRESS NOTE [facility]   ROBEN SCHLIEP   WNI:[id number] DOB: 01/21/1946  DOA: 02/07/2017 PCP: Redmond School, MD   Brief Narrative:  71 year old man admitted to the hospital on 6/26 from his PCPs office due to worsening edema and erythema of his left foot. MRI shows evidence for osteomyelitis, septic arthritis and an abscess along the aspect of the first MTP joint and admission has been requested. Seen by general surgery today who is recommended transfer to [facility] for evaluation by vascular surgery for potential revascularization and amputation. IR has evaluated patient and schedule for revascularization tonight.   Subjective: Patient seen and examined, has no complaints. No foot pain. Afebrile    Assessment & Plan:   Active Problems:   S/P CABG x 3 - 2010   S/P AVR-tissue, 2010   Dyslipidemia   Obesity (BMI 30-39.9)   Peripheral arterial occlusive disease (HCC)   PVD (peripheral vascular disease) (HCC)   A-fib (HCC)   CKD (chronic kidney disease), stage III   Diabetic foot infection (HCC)   Type 2 diabetes mellitus with left diabetic foot infection ([place])   Uncontrolled type 2 diabetes mellitus with hyperglycemia, with long-term current use of insulin (HCC)   Left foot osteomyelitis -MRI shows severe extensive bone destruction of the distal half of the first metatarsal and base of the first proximal phalanx centered around the first MTP joint most consistent with septic arthritis and osteomyelitis as well as an abscess along the medial aspect of the first MTP joint. -Patient was admitted for cellulitis in the same area with no signs of bone infection  -Patient on vanc and cefepime, will continue for now, after surgical procedures will discuss with ID length of treatment  -Holding anticoagulation in view of procedure  -IR recommendations appreciated  -Ortho has been consulted in view of possible amputation   Peripheral vascular  disease -04/23/15--right lower extremity arteriogram with recanalization of the right SFA occlusion and angioplasty/stenting of the right SFA and popliteal artery -10/07/15--right atherectomy and drug-eluting balloon angioplasty of the distal SFA/popliteal stenosis as well as atherectomy and angioplasty of a peroneal artery stenosis -12/12/16--ABIwere 0.41 on the right and 0.62 on the left, previously 0.66 and 0.61 respectively -IR planning for revascularization   Paroxysmal A. Fib -Currently in sinus rhythm and rate controlled -Continue amiodarone and metoprolol, -Continue to hold eliquis in view of possible procedure   Type 2 diabetes -CBGs are uncontrolled. -Will hold Lantus as patient is NPO  -Continue SSI   Hyperlipidemia  -continue Lovaza  Hypothyroidism -Continue Synthroid  Stage III chronic kidney disease -Baseline creatinine between 1.2 and 1.5 -Currently at baseline  Chronic diastolic CHF -Echo in March 2018 with an ejection fraction of 55% and grade 1 diastolic dysfunction. -Is clinically euvolemic  DVT prophylaxis: SCD's  Code Status: FULL  Family Communication: None at bedside  Disposition Plan: Home when medically stable   Consultants:   IR   Orthopedic surgery   Procedures:   None   Antimicrobials: Anti-infectives    Start     Dose/Rate Route Frequency Ordered Stop   02/09/17 2200  ceFEPIme (MAXIPIME) 2 g in dextrose 5 % 50 mL IVPB     2 g 100 mL/hr over 30 Minutes Intravenous Every 12 hours 02/09/17 0930     02/08/17 1000  ceFEPIme (MAXIPIME) 2 g in dextrose 5 % 50 mL IVPB  Status:  Discontinued     2 g 100 mL/hr over 30 Minutes Intravenous Every 24 hours 02/07/17 1644  02/09/17 0930   02/08/17 0600  vancomycin (VANCOCIN) IVPB 1000 mg/200 mL premix     1,000 mg 200 mL/hr over 60 Minutes Intravenous Every 12 hours 02/07/17 1643     02/07/17 1445  vancomycin (VANCOCIN) 1,500 mg in sodium chloride 0.9 % 500 mL IVPB     1,500 mg 250 mL/hr over  120 Minutes Intravenous  Once 02/07/17 1433 02/08/17 0009   02/07/17 1445  ceFEPIme (MAXIPIME) 2 g in dextrose 5 % 50 mL IVPB     2 g 100 mL/hr over 30 Minutes Intravenous  Once 02/07/17 1433 02/07/17 2200         Objective: Vitals:   02/09/17 1705 02/09/17 2205 02/10/17 0555 02/10/17 1425  BP: (!) 158/59 138/60 (!) 144/57 (!) 151/65  Pulse: 66 72 69 66  Resp: 18 18 17 18   Temp: 97.6 F (36.4 C) 97.9 F (36.6 C) 98.4 F (36.9 C) 97.5 F (36.4 C)  TempSrc: Oral Oral  Oral  SpO2: 98% 99% 96% 100%  Weight:      Height:        Intake/Output Summary (Last 24 hours) at 02/10/17 1519 Last data filed at 02/10/17 1355  Gross per 24 hour  Intake              200 ml  Output             1475 ml  Net            -1275 ml   Filed Weights   02/07/17 1335 02/08/17 0500 02/09/17 0440  Weight: 106.6 kg (235 lb) 106.6 kg (235 lb) 108 kg (238 lb 1.6 oz)    Examination:  General exam: Appears calm and comfortable  Respiratory system: Clear to auscultation. No wheezes,crackle or rhonchi Cardiovascular system: S1 & S2 heard, RRR. No JVD, murmurs, rubs or gallops Gastrointestinal system: Abdomen is nondistended, soft and nontender.  Central nervous system: Alert and oriented.  Extremities: No pedal edema. Skin: See picture below  Psychiatry: Judgement and insight appear normal. Mood & affect appropriate.       Data Reviewed: I have personally reviewed following labs and imaging studies  CBC:  Recent Labs Lab 02/07/17 1540 02/08/17 0552 02/09/17 0541 02/10/17 1111  WBC 7.4 7.0 6.4 6.5  HGB 11.9* 11.2* 11.1* 12.6*  HCT 36.7* 35.4* 34.2* 39.8  MCV 88.0 88.5 86.6 87.5  PLT 252 234 209 211   Basic Metabolic Panel:  Recent Labs Lab 02/07/17 1540 02/08/17 0552 02/09/17 0541 02/10/17 1111  NA 136 137 135 137  K 4.1 4.3 4.2 4.7  CL 100* 102 100* 102  CO2 26 27 28 26   GLUCOSE 220* 218* 230* 198*  BUN 24* 21* 20 17  CREATININE 1.58* 1.34* 1.37* 1.23  CALCIUM 9.1 8.7*  8.8* 9.6   GFR: Estimated Creatinine Clearance: 70.7 mL/min (by C-G formula based on SCr of 1.23 mg/dL). Liver Function Tests: No results for input(s): AST, ALT, ALKPHOS, BILITOT, PROT, ALBUMIN in the last 168 hours. No results for input(s): LIPASE, AMYLASE in the last 168 hours. No results for input(s): AMMONIA in the last 168 hours. Coagulation Profile:  Recent Labs Lab 02/10/17 0413  INR 1.08   Cardiac Enzymes: No results for input(s): CKTOTAL, CKMB, CKMBINDEX, TROPONINI in the last 168 hours. BNP (last 3 results) No results for input(s): PROBNP in the last 8760 hours. HbA1C:  Recent Labs  02/07/17 1540  HGBA1C 6.9*   CBG:  Recent Labs Lab 02/09/17 1111 02/09/17 1708 02/09/17  2154 02/10/17 0748 02/10/17 1235  GLUCAP 209* 271* 269* 311* 164*   Lipid Profile: No results for input(s): CHOL, HDL, LDLCALC, TRIG, CHOLHDL, LDLDIRECT in the last 72 hours. Thyroid Function Tests: No results for input(s): TSH, T4TOTAL, FREET4, T3FREE, THYROIDAB in the last 72 hours. Anemia Panel: No results for input(s): VITAMINB12, FOLATE, FERRITIN, TIBC, IRON, RETICCTPCT in the last 72 hours. Sepsis Labs: No results for input(s): PROCALCITON, LATICACIDVEN in the last 168 hours.  Recent Results (from the past 240 hour(s))  Culture, blood (Routine X 2) w Reflex to ID Panel     Status: None (Preliminary result)   Collection Time: 02/07/17  3:40 PM  Result Value Ref Range Status   Specimen Description   Final    RIGHT ANTECUBITAL BOTTLES DRAWN AEROBIC AND ANAEROBIC   Special Requests Blood Culture adequate volume  Final   Culture NO GROWTH 3 DAYS  Final   Report Status PENDING  Incomplete  Culture, blood (Routine X 2) w Reflex to ID Panel     Status: None (Preliminary result)   Collection Time: 02/07/17  3:40 PM  Result Value Ref Range Status   Specimen Description   Final    RIGHT ANTECUBITAL BOTTLES DRAWN AEROBIC AND ANAEROBIC   Special Requests   Final    Blood Culture results  may not be optimal due to an inadequate volume of blood received in culture bottles   Culture NO GROWTH 3 DAYS  Final   Report Status PENDING  Incomplete  MRSA PCR Screening     Status: None   Collection Time: 02/09/17  5:09 PM  Result Value Ref Range Status   MRSA by PCR NEGATIVE NEGATIVE Final    Comment:        The GeneXpert MRSA Assay (FDA approved for NASAL specimens only), is one component of a comprehensive MRSA colonization surveillance program. It is not intended to diagnose MRSA infection nor to guide or monitor treatment for MRSA infections.          Radiology Studies: No results found.    Scheduled Meds: . alprazolam  1 mg Oral TID  . amiodarone  200 mg Oral Daily  . atorvastatin  20 mg Oral q1800  . docusate sodium  100 mg Oral BID  . fenofibrate  160 mg Oral Daily  . ferrous sulfate  325 mg Oral Q breakfast  . gabapentin  300 mg Oral BID  . insulin aspart  0-20 Units Subcutaneous TID WC  . insulin aspart  0-5 Units Subcutaneous QHS  . insulin aspart  4 Units Subcutaneous TID WC  . levothyroxine  25 mcg Oral QAC breakfast  . metoprolol succinate  25 mg Oral Daily  . omega-3 acid ethyl esters  1 g Oral Daily  . pantoprazole  40 mg Oral BID  . torsemide  10 mg Oral Daily   Continuous Infusions: . ceFEPime (MAXIPIME) IV Stopped (02/10/17 1345)  . vancomycin Stopped (02/10/17 0635)     LOS: 3 days    Time spent: Total of 15 minutes spent with pt, greater than 50% of which was spent in discussion of  treatment, counseling and coordination of care    Chipper Oman, MD Pager: Text Page via [email or web address]  [phone]  If 7PM-7AM, please contact night-coverage [email or web address] Password [facility] 02/10/2017, 3:19 PM

## 2017-02-10 NOTE — Care Management Important Message (Signed)
Important Message  Patient Details  Name: [name] MRN: [id number] Date of Birth: 20-May-1946   Medicare Important Message Given:  Yes    Onyx Schirmer Montine Circle 02/10/2017, 12:18 PM

## 2017-02-10 NOTE — Sedation Documentation (Signed)
Assumed care of pt

## 2017-02-11 DIAGNOSIS — M869 Osteomyelitis, unspecified: Secondary | ICD-10-CM

## 2017-02-11 DIAGNOSIS — E1169 Type 2 diabetes mellitus with other specified complication: Principal | ICD-10-CM

## 2017-02-11 LAB — CBC WITH DIFFERENTIAL/PLATELET
BASOS PCT: 1 %
Basophils Absolute: 0 10*3/uL (ref 0.0–0.1)
Eosinophils Absolute: 0.2 10*3/uL (ref 0.0–0.7)
Eosinophils Relative: 3 %
HEMATOCRIT: 36.1 % — AB (ref 39.0–52.0)
Hemoglobin: 11.4 g/dL — ABNORMAL LOW (ref 13.0–17.0)
LYMPHS ABS: 1.9 10*3/uL (ref 0.7–4.0)
LYMPHS PCT: 28 %
MCH: 27.6 pg (ref 26.0–34.0)
MCHC: 31.6 g/dL (ref 30.0–36.0)
MCV: 87.4 fL (ref 78.0–100.0)
MONO ABS: 0.6 10*3/uL (ref 0.1–1.0)
MONOS PCT: 9 %
NEUTROS ABS: 4 10*3/uL (ref 1.7–7.7)
Neutrophils Relative %: 59 %
Platelets: 190 10*3/uL (ref 150–400)
RBC: 4.13 MIL/uL — ABNORMAL LOW (ref 4.22–5.81)
RDW: 13.6 % (ref 11.5–15.5)
WBC: 6.7 10*3/uL (ref 4.0–10.5)

## 2017-02-11 LAB — BASIC METABOLIC PANEL
ANION GAP: 7 (ref 5–15)
BUN: 17 mg/dL (ref 6–20)
CALCIUM: 8.7 mg/dL — AB (ref 8.9–10.3)
CO2: 26 mmol/L (ref 22–32)
Chloride: 102 mmol/L (ref 101–111)
Creatinine, Ser: 1.21 mg/dL (ref 0.61–1.24)
GFR calc Af Amer: >60 mL/min (ref >60)
GFR calc non Af Amer: 60 mL/min — ABNORMAL LOW (ref >60)
Glucose, Bld: 294 mg/dL — ABNORMAL HIGH (ref 65–99)
Potassium: 4.4 mmol/L (ref 3.5–5.1)
Sodium: 135 mmol/L (ref 135–145)

## 2017-02-11 LAB — GLUCOSE, CAPILLARY
GLUCOSE-CAPILLARY: 312 mg/dL — AB (ref 65–99)
Glucose-Capillary: 272 mg/dL — ABNORMAL HIGH (ref 65–99)
Glucose-Capillary: 302 mg/dL — ABNORMAL HIGH (ref 65–99)
Glucose-Capillary: 362 mg/dL — ABNORMAL HIGH (ref 65–99)

## 2017-02-11 MED ORDER — INSULIN GLARGINE 100 UNIT/ML ~~LOC~~ SOLN
45.0000 [IU] | Freq: Every day | SUBCUTANEOUS | Status: DC
  Administered 2017-02-11 – 2017-02-13 (×3): 45 [IU] via SUBCUTANEOUS
  Filled 2017-02-11 (×3): qty 0.45

## 2017-02-11 NOTE — Progress Notes (Signed)
PROGRESS NOTE [facility]   [name]   ZOX:[id number] DOB: 08/04/1946  DOA: 02/07/2017 PCP: Redmond School, MD   Brief Narrative:  71 year old man admitted to the hospital on 6/26 from his PCPs office due to worsening edema and erythema of his left foot. MRI shows evidence for osteomyelitis, septic arthritis and an abscess along the aspect of the first MTP joint and admission has been requested. Seen by general surgery today who is recommended transfer to [facility] for evaluation by vascular surgery for potential revascularization and amputation. IR has evaluated patient and schedule for revascularization tonight.   Subjective: No complaints, today. Wants to go home. No acute events after procedure.   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 -Patient was admitted for cellulitis in the same area with no signs of bone infection back in April 2018 -Will continue Cefepime and Vanc for now - pending on surgical results patient may need amputation. Pending on surgery will decide length of abx therapy.  -Holding anticoagulation in view of procedure  -Ortho and IR help appreciated   Peripheral vascular disease -04/23/15--right lower  extremity arteriogram with recanalization of the right SFA occlusion and angioplasty/stenting of the right SFA and popliteal artery -10/07/15--right atherectomy and drug-eluting balloon angioplasty of the distal SFA/popliteal stenosis as well as atherectomy and angioplasty of a peroneal artery stenosis -12/12/16--ABIwere 0.41 on the right and 0.62 on the left, previously 0.66 and 0.61 respectively -02/10/17 s/p LLE angiogram and orbital atherectomy w DCB-PTA of multiple severe stenoses throughout the SFA and pop arteries.   Paroxysmal A. Fib - remains stable  -Currently in sinus rhythm and rate controlled -Continue amiodarone and metoprolol, -Continue to hold eliquis in view of possible procedure   Type 2 diabetes  -CBGs remain uncontrolled. -Lantus was on hold due to patient been NPO, will resume now  -Continue SSI and monitor CBG's   Hyperlipidemia  -continue Lovaza  Hypothyroidism -Continue Synthroid  Stage III chronic kidney disease -Baseline creatinine between 1.2 and 1.5 -Cr remains at baseline   Chronic diastolic CHF -Echo in March 2018 with an ejection fraction of 55% and grade 1 diastolic dysfunction. -Is clinically euvolemic  DVT prophylaxis: SCD's  Code Status: FULL  Family Communication: None at bedside  Disposition Plan: Home when medically stable   Consultants:   IR   Orthopedic surgery   Procedures:   None   Antimicrobials: Anti-infectives    Start     Dose/Rate Route Frequency Ordered Stop   02/09/17 2200  ceFEPIme (MAXIPIME) 2 g in dextrose 5 % 50 mL IVPB     2 g 100 mL/hr over 30 Minutes Intravenous Every 12 hours 02/09/17 0930     02/08/17 1000  ceFEPIme (  MAXIPIME) 2 g in dextrose 5 % 50 mL IVPB  Status:  Discontinued     2 g 100 mL/hr over 30 Minutes Intravenous Every 24 hours 02/07/17 1644 02/09/17 0930   02/08/17 0600  vancomycin (VANCOCIN) IVPB 1000 mg/200 mL premix     1,000 mg 200 mL/hr over 60 Minutes Intravenous Every 12 hours  02/07/17 1643     02/07/17 1445  vancomycin (VANCOCIN) 1,500 mg in sodium chloride 0.9 % 500 mL IVPB     1,500 mg 250 mL/hr over 120 Minutes Intravenous  Once 02/07/17 1433 02/08/17 0009   02/07/17 1445  ceFEPIme (MAXIPIME) 2 g in dextrose 5 % 50 mL IVPB     2 g 100 mL/hr over 30 Minutes Intravenous  Once 02/07/17 1433 02/07/17 2200         Objective: Vitals:   02/10/17 1827 02/10/17 1846 02/10/17 2101 02/11/17 0515  BP: (!) 154/72 (!) 158/57 (!) 142/50 (!) 138/50  Pulse: 62 66 74 61  Resp: 17 18 18 18   Temp:  97.7 F (36.5 C) 98 F (36.7 C) 98.3 F (36.8 C)  TempSrc:  Oral Oral Oral  SpO2: 98% 97% 96% 98%  Weight:      Height:        Intake/Output Summary (Last 24 hours) at 02/11/17 0936 Last data filed at 02/11/17 0516  Gross per 24 hour  Intake              820 ml  Output              200 ml  Net              620 ml   Filed Weights   02/07/17 1335 02/08/17 0500 02/09/17 0440  Weight: 106.6 kg (235 lb) 106.6 kg (235 lb) 108 kg (238 lb 1.6 oz)    Examination:  General: Pt is alert, awake, not in acute distress Cardiovascular: RRR, S1/S2 +, no rubs, no gallops Respiratory: CTA bilaterally, no wheezing, no rhonchi Abdominal: Soft, NT, ND, bowel sounds + Extremities: no edema, no cyanosis, pulses unable to palpate, but audible with dupplex Skin: see picture below, improving      Admission        02/11/2017   Data Reviewed: I have personally reviewed following labs and imaging studies  CBC:  Recent Labs Lab 02/07/17 1540 02/08/17 0552 02/09/17 0541 02/10/17 1111 02/11/17 0421  WBC 7.4 7.0 6.4 6.5 6.7  NEUTROABS  --   --   --   --  4.0  HGB 11.9* 11.2* 11.1* 12.6* 11.4*  HCT 36.7* 35.4* 34.2* 39.8 36.1*  MCV 88.0 88.5 86.6 87.5 87.4  PLT 252 234 209 243 619   Basic Metabolic Panel:  Recent Labs Lab 02/07/17 1540 02/08/17 0552 02/09/17 0541 02/10/17 1111 02/11/17 0421  NA 136 137 135 137 135  K 4.1 4.3 4.2 4.7 4.4  CL 100* 102 100* 102 102    CO2 26 27 28 26 26   GLUCOSE 220* 218* 230* 198* 294*  BUN 24* 21* 20 17 17   CREATININE 1.58* 1.34* 1.37* 1.23 1.21  CALCIUM 9.1 8.7* 8.8* 9.6 8.7*   GFR: Estimated Creatinine Clearance: 71.9 mL/min (by C-G formula based on SCr of 1.21 mg/dL). Liver Function Tests: No results for input(s): AST, ALT, ALKPHOS, BILITOT, PROT, ALBUMIN in the last 168 hours. No results for input(s): LIPASE, AMYLASE in the last 168 hours. No results for input(s): AMMONIA in the last 168 hours. Coagulation Profile:  Recent  Labs Lab 02/10/17 0413  INR 1.08   Cardiac Enzymes: No results for input(s): CKTOTAL, CKMB, CKMBINDEX, TROPONINI in the last 168 hours. BNP (last 3 results) No results for input(s): PROBNP in the last 8760 hours. HbA1C: No results for input(s): HGBA1C in the last 72 hours. CBG:  Recent Labs Lab 02/10/17 0748 02/10/17 1235 02/10/17 1843 02/10/17 2105 02/11/17 0753  GLUCAP 311* 164* 182* 270* 272*   Lipid Profile: No results for input(s): CHOL, HDL, LDLCALC, TRIG, CHOLHDL, LDLDIRECT in the last 72 hours. Thyroid Function Tests: No results for input(s): TSH, T4TOTAL, FREET4, T3FREE, THYROIDAB in the last 72 hours. Anemia Panel: No results for input(s): VITAMINB12, FOLATE, FERRITIN, TIBC, IRON, RETICCTPCT in the last 72 hours. Sepsis Labs: No results for input(s): PROCALCITON, LATICACIDVEN in the last 168 hours.  Recent Results (from the past 240 hour(s))  Culture, blood (Routine X 2) w Reflex to ID Panel     Status: None (Preliminary result)   Collection Time: 02/07/17  3:40 PM  Result Value Ref Range Status   Specimen Description   Final    RIGHT ANTECUBITAL BOTTLES DRAWN AEROBIC AND ANAEROBIC   Special Requests Blood Culture adequate volume  Final   Culture NO GROWTH 3 DAYS  Final   Report Status PENDING  Incomplete  Culture, blood (Routine X 2) w Reflex to ID Panel     Status: None (Preliminary result)   Collection Time: 02/07/17  3:40 PM  Result Value Ref Range  Status   Specimen Description   Final    RIGHT ANTECUBITAL BOTTLES DRAWN AEROBIC AND ANAEROBIC   Special Requests   Final    Blood Culture results may not be optimal due to an inadequate volume of blood received in culture bottles   Culture NO GROWTH 3 DAYS  Final   Report Status PENDING  Incomplete  MRSA PCR Screening     Status: None   Collection Time: 02/09/17  5:09 PM  Result Value Ref Range Status   MRSA by PCR NEGATIVE NEGATIVE Final    Comment:        The GeneXpert MRSA Assay (FDA approved for NASAL specimens only), is one component of a comprehensive MRSA colonization surveillance program. It is not intended to diagnose MRSA infection nor to guide or monitor treatment for MRSA infections.      Radiology Studies: Ir Angiogram Extremity Left  Result Date: 02/10/2017 INDICATION: 71 year old male with Rutherford category 5/6 critical limb ischemia involving the left forefoot. He presents for arteriogram and endovascular optimization of blood flow to the wound. EXAM: LEFT EXTREMITY ARTERIOGRAPHY; IR ULTRASOUND GUIDANCE VASC ACCESS RIGHT; IR FEM POP ART ATHERECT INC PTA MEDICATIONS: Patient is currently an inpatient receiving intravenous antibiotics. No additional antibiotics were administered. 8000 units heparin and 1100 mcg nitroglycerin were administered. ANESTHESIA/SEDATION: Moderate (conscious) sedation was employed during this procedure. A total of Versed 3.5 mg and Fentanyl 175 mcg was administered intravenously. Moderate Sedation Time: 125 minutes. The patient's level of consciousness and vital signs were monitored continuously by radiology nursing throughout the procedure under my direct supervision. CONTRAST:  68mL VISIPAQUE IODIXANOL 320 MG/ML IV SOLN, 2mL VISIPAQUE IODIXANOL 320 MG/ML IV SOLN FLUOROSCOPY TIME:  Fluoroscopy Time: 26 minutes 36 seconds (219 mGy). COMPLICATIONS: None immediate. PROCEDURE: Informed consent was obtained from the patient following explanation of  the procedure, risks, benefits and alternatives. The patient understands, agrees and consents for the procedure. All questions were addressed. A time out was performed prior to the initiation of the procedure. Maximal  barrier sterile technique utilized including caps, mask, sterile gowns, sterile gloves, large sterile drape, hand hygiene, and Betadine prep. The right common femoral artery was interrogated with ultrasound and found to be widely patent. An image was obtained and stored for the medical record. Local anesthesia was attained by infiltration with 1% lidocaine. A small dermatotomy was made. Under real-time sonographic guidance, the vessel was punctured with a 21 gauge micropuncture needle. Using standard technique, the initial micro needle was exchanged over a 0.018 micro wire for a transitional 4 [place] micro sheath. The micro sheath was then exchanged over a 0.035 wire for a 5 French vascular sheath. An Omni flush catheter was advanced to the aortic bifurcation and pelvic arteriography was performed. Both internal iliac arteries are widely patent. The common and external iliac arteries are widely patent. The Omni flush catheter was then advanced up in over the aortic bifurcation and into the left common femoral artery. A multi station left lower extremity arteriogram was then performed. There is a multifocal calcified atherosclerotic plaque throughout the superficial femoral artery. There are multiple regions of a moderate and high-grade focal stenosis. The most critical stenosis is in the region of of the mid thigh were the vessel is approximately 90% stenosis. The distal most superficial femoral artery in the P1 segment of the popliteal artery are relatively spared from disease. However, there is a focal high-grade stenosis in the P2 segment of the popliteal artery just above the joint space. Significant runoff disease. Both the anterior tibial and posterior tibial arteries are occluded. The peroneal  artery provides the dominant runoff to the foot and reconstitutes both the distal posterior tibial artery and the dorsalis pedis artery. The plantar loop is intact. A CX I catheter was advanced over a Bentson wire into the distal popliteal artery. The Bentson wire was then exchanged for a by per wire. Multifocal orbital atherectomy was performed throughout the superficial femoral and popliteal artery easy using a 2.0 classic crown for the CS I Diamond back device. Following atherectomy, multi station drug-eluting balloon angioplasty was performed. The P2 segment popliteal artery was treated using a 5 x 120 IN.PACT balloon. The remaining superficial femoral artery was treated with minimally overlapping stations using two 6 x 150 IN.PACT balloons. There was a focal stenosis in the mid superficial femoral artery at the site of the most severe stenosis which could not be completely opened using the IN.PACT balloon. This remained present with a moderate to high-grade residual focal stenosis on follow-up angiography. There are multifocal mild non flow limiting linear dissections. The remaining residual stenosis was then treated using a 5 x 40 mm are mottled balloon. The are mild balloon was successfully expanded to full effacement with rupture of the plaque. The balloon was left inflated for a prolonged period of time. Follow-up arteriography demonstrates improved luminal gain. There is no residual significant stenosis. Lateral foot arteriography was again performed demonstrating no evidence of distal embolic phenomenon. The wires and catheter were removed. Hemostasis was attained with the assistance of a Cordis Exoseal extra arterial vascular plug. The patient tolerated the procedure well. IMPRESSION: 1. Left lower extremity diagnostic arteriography demonstrates a diffusely diseased superficial femoral artery with multiple areas of moderate and high-grade focal stenosis secondary to heavily calcified atherosclerotic  plaque. Additionally, there is a focal high-grade stenosis in the P2 segment of the popliteal artery. 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 2. Successful  orbital atherectomy and drug coated balloon angioplasty of the popliteal artery to 5 mm, in the superficial femoral artery to 6 mm. PLAN: While blood flow is significantly improved, patient would likely benefit from reconstruction of the occluded tibial arteries. We will arrange for a second session arteriogram in the near future to attempt to re- vascularized the posterior tibial, and possibly the anterior tibial arteries. Recommend continued wound care and possible debridement. Patient would like to avoid amputation if at all possible. Signed, Criselda Peaches, MD [facility] Radiology Electronically Signed   By: Jacqulynn Cadet M.D.   On: 02/10/2017 19:26   Twining Pta Mod Sed  Result Date: 02/10/2017 INDICATION: 71 year old male with Rutherford category 5/6 critical limb ischemia involving the left forefoot. He presents for arteriogram and endovascular optimization of blood flow to the wound. EXAM: LEFT EXTREMITY ARTERIOGRAPHY; IR ULTRASOUND GUIDANCE VASC ACCESS RIGHT; IR FEM POP ART ATHERECT INC PTA MEDICATIONS: Patient is currently an inpatient receiving intravenous antibiotics. No additional antibiotics were administered. 8000 units heparin and 1100 mcg nitroglycerin were administered. ANESTHESIA/SEDATION: Moderate (conscious) sedation was employed during this procedure. A total of Versed 3.5 mg and Fentanyl 175 mcg was administered intravenously. Moderate Sedation Time: 125 minutes. The patient's level of consciousness and vital signs were monitored continuously by radiology nursing throughout the procedure under my direct supervision. CONTRAST:  81mL VISIPAQUE  IODIXANOL 320 MG/ML IV SOLN, 29mL VISIPAQUE IODIXANOL 320 MG/ML IV SOLN FLUOROSCOPY TIME:  Fluoroscopy Time: 26 minutes 36 seconds (219 mGy). COMPLICATIONS: None immediate. PROCEDURE: Informed consent was obtained from the patient following explanation of the procedure, risks, benefits and alternatives. The patient understands, agrees and consents for the procedure. All questions were addressed. A time out was performed prior to the initiation of the procedure. Maximal barrier sterile technique utilized including caps, mask, sterile gowns, sterile gloves, large sterile drape, hand hygiene, and Betadine prep. The right common femoral artery was interrogated with ultrasound and found to be widely patent. An image was obtained and stored for the medical record. Local anesthesia was attained by infiltration with 1% lidocaine. A small dermatotomy was made. Under real-time sonographic guidance, the vessel was punctured with a 21 gauge micropuncture needle. Using standard technique, the initial micro needle was exchanged over a 0.018 micro wire for a transitional 4 [place] micro sheath. The micro sheath was then exchanged over a 0.035 wire for a 5 French vascular sheath. An Omni flush catheter was advanced to the aortic bifurcation and pelvic arteriography was performed. Both internal iliac arteries are widely patent. The common and external iliac arteries are widely patent. The Omni flush catheter was then advanced up in over the aortic bifurcation and into the left common femoral artery. A multi station left lower extremity arteriogram was then performed. There is a multifocal calcified atherosclerotic plaque throughout the superficial femoral artery. There are multiple regions of a moderate and high-grade focal stenosis. The most critical stenosis is in the region of of the mid thigh were the vessel is approximately 90% stenosis. The distal most superficial femoral artery in the P1 segment of the popliteal artery are  relatively spared from disease. However, there is a focal high-grade stenosis in the P2 segment of the popliteal artery just above the joint space. Significant runoff disease. Both the anterior tibial and posterior tibial arteries are occluded. The peroneal artery provides the dominant runoff to the foot and reconstitutes both the distal posterior tibial artery and the dorsalis  pedis artery. The plantar loop is intact. A CX I catheter was advanced over a Bentson wire into the distal popliteal artery. The Bentson wire was then exchanged for a by per wire. Multifocal orbital atherectomy was performed throughout the superficial femoral and popliteal artery easy using a 2.0 classic crown for the CS I Diamond back device. Following atherectomy, multi station drug-eluting balloon angioplasty was performed. The P2 segment popliteal artery was treated using a 5 x 120 IN.PACT balloon. The remaining superficial femoral artery was treated with minimally overlapping stations using two 6 x 150 IN.PACT balloons. There was a focal stenosis in the mid superficial femoral artery at the site of the most severe stenosis which could not be completely opened using the IN.PACT balloon. This remained present with a moderate to high-grade residual focal stenosis on follow-up angiography. There are multifocal mild non flow limiting linear dissections. The remaining residual stenosis was then treated using a 5 x 40 mm are mottled balloon. The are mild balloon was successfully expanded to full effacement with rupture of the plaque. The balloon was left inflated for a prolonged period of time. Follow-up arteriography demonstrates improved luminal gain. There is no residual significant stenosis. Lateral foot arteriography was again performed demonstrating no evidence of distal embolic phenomenon. The wires and catheter were removed. Hemostasis was attained with the assistance of a Cordis Exoseal extra arterial vascular plug. The patient  tolerated the procedure well. IMPRESSION: 1. Left lower extremity diagnostic arteriography demonstrates a diffusely diseased superficial femoral artery with multiple areas of moderate and high-grade focal stenosis secondary to heavily calcified atherosclerotic plaque. Additionally, there is a focal high-grade stenosis in the P2 segment of the popliteal artery. 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 2. Successful orbital atherectomy and drug coated balloon angioplasty of the popliteal artery to 5 mm, in the superficial femoral artery to 6 mm. PLAN: While blood flow is significantly improved, patient would likely benefit from reconstruction of the occluded tibial arteries. We will arrange for a second session arteriogram in the near future to attempt to re- vascularized the posterior tibial, and possibly the anterior tibial arteries. Recommend continued wound care and possible debridement. Patient would like to avoid amputation if at all possible. Signed, Criselda Peaches, MD [facility] Radiology Electronically Signed   By: Jacqulynn Cadet M.D.   On: 02/10/2017 19:26   Ir US Guide Vasc Access Right  Result Date: 02/10/2017 INDICATION: 71 year old male with Rutherford category 5/6 critical limb ischemia involving the left forefoot. He presents for arteriogram and endovascular optimization of blood flow to the wound. EXAM: LEFT EXTREMITY ARTERIOGRAPHY; IR ULTRASOUND GUIDANCE VASC ACCESS RIGHT; IR FEM POP ART ATHERECT INC PTA MEDICATIONS: Patient is currently an inpatient receiving intravenous antibiotics. No additional antibiotics were administered. 8000 units heparin and 1100 mcg nitroglycerin were administered. ANESTHESIA/SEDATION: Moderate (conscious) sedation was employed during this procedure. A total of Versed 3.5 mg and Fentanyl 175 mcg  was administered intravenously. Moderate Sedation Time: 125 minutes. The patient's level of consciousness and vital signs were monitored continuously by radiology nursing throughout the procedure under my direct supervision. CONTRAST:  75mL VISIPAQUE IODIXANOL 320 MG/ML IV SOLN, 67mL VISIPAQUE IODIXANOL 320 MG/ML IV SOLN FLUOROSCOPY TIME:  Fluoroscopy Time: 26 minutes 36 seconds (219 mGy). COMPLICATIONS: None immediate. PROCEDURE: Informed consent was obtained from the patient following explanation of the procedure, risks, benefits and alternatives. The patient understands,  agrees and consents for the procedure. All questions were addressed. A time out was performed prior to the initiation of the procedure. Maximal barrier sterile technique utilized including caps, mask, sterile gowns, sterile gloves, large sterile drape, hand hygiene, and Betadine prep. The right common femoral artery was interrogated with ultrasound and found to be widely patent. An image was obtained and stored for the medical record. Local anesthesia was attained by infiltration with 1% lidocaine. A small dermatotomy was made. Under real-time sonographic guidance, the vessel was punctured with a 21 gauge micropuncture needle. Using standard technique, the initial micro needle was exchanged over a 0.018 micro wire for a transitional 4 [place] micro sheath. The micro sheath was then exchanged over a 0.035 wire for a 5 French vascular sheath. An Omni flush catheter was advanced to the aortic bifurcation and pelvic arteriography was performed. Both internal iliac arteries are widely patent. The common and external iliac arteries are widely patent. The Omni flush catheter was then advanced up in over the aortic bifurcation and into the left common femoral artery. A multi station left lower extremity arteriogram was then performed. There is a multifocal calcified atherosclerotic plaque throughout the superficial femoral artery. There are multiple  regions of a moderate and high-grade focal stenosis. The most critical stenosis is in the region of of the mid thigh were the vessel is approximately 90% stenosis. The distal most superficial femoral artery in the P1 segment of the popliteal artery are relatively spared from disease. However, there is a focal high-grade stenosis in the P2 segment of the popliteal artery just above the joint space. Significant runoff disease. Both the anterior tibial and posterior tibial arteries are occluded. The peroneal artery provides the dominant runoff to the foot and reconstitutes both the distal posterior tibial artery and the dorsalis pedis artery. The plantar loop is intact. A CX I catheter was advanced over a Bentson wire into the distal popliteal artery. The Bentson wire was then exchanged for a by per wire. Multifocal orbital atherectomy was performed throughout the superficial femoral and popliteal artery easy using a 2.0 classic crown for the CS I Diamond back device. Following atherectomy, multi station drug-eluting balloon angioplasty was performed. The P2 segment popliteal artery was treated using a 5 x 120 IN.PACT balloon. The remaining superficial femoral artery was treated with minimally overlapping stations using two 6 x 150 IN.PACT balloons. There was a focal stenosis in the mid superficial femoral artery at the site of the most severe stenosis which could not be completely opened using the IN.PACT balloon. This remained present with a moderate to high-grade residual focal stenosis on follow-up angiography. There are multifocal mild non flow limiting linear dissections. The remaining residual stenosis was then treated using a 5 x 40 mm are mottled balloon. The are mild balloon was successfully expanded to full effacement with rupture of the plaque. The balloon was left inflated for a prolonged period of time. Follow-up arteriography demonstrates improved luminal gain. There is no residual significant stenosis.  Lateral foot arteriography was again performed demonstrating no evidence of distal embolic phenomenon. The wires and catheter were removed. Hemostasis was attained with the assistance of a Cordis Exoseal extra arterial vascular plug. The patient tolerated the procedure well. IMPRESSION: 1. Left lower extremity diagnostic arteriography demonstrates a diffusely diseased superficial femoral artery with multiple areas of moderate and high-grade focal stenosis secondary to heavily calcified atherosclerotic plaque. Additionally, there is a focal high-grade stenosis in the P2 segment of the popliteal artery. 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 2. Successful orbital atherectomy and drug coated balloon angioplasty of the popliteal artery to 5 mm, in the superficial femoral artery to 6 mm. PLAN: While blood flow is significantly improved, patient would likely benefit from reconstruction of the occluded tibial arteries. We will arrange for a second session arteriogram in the near future to attempt to re- vascularized the posterior tibial, and possibly the anterior tibial arteries. Recommend continued wound care and possible debridement. Patient would like to avoid amputation if at all possible. Signed, Criselda Peaches, MD [facility] Radiology Electronically Signed   By: Jacqulynn Cadet M.D.   On: 02/10/2017 19:26      Scheduled Meds: . alprazolam  1 mg Oral TID  . amiodarone  200 mg Oral Daily  . atorvastatin  20 mg Oral q1800  . docusate sodium  100 mg Oral BID  . fenofibrate  160 mg Oral Daily  . ferrous sulfate  325 mg Oral Q breakfast  . gabapentin  300 mg Oral BID  . insulin aspart  0-20 Units Subcutaneous TID WC  . insulin aspart  0-5 Units Subcutaneous QHS  . insulin aspart  4 Units Subcutaneous TID WC  . levothyroxine  25 mcg  Oral QAC breakfast  . metoprolol succinate  25 mg Oral Daily  . omega-3 acid ethyl esters  1 g Oral Daily  . pantoprazole  40 mg Oral BID  . torsemide  10 mg Oral Daily   Continuous Infusions: . ceFEPime (MAXIPIME) IV Stopped (02/10/17 2223)  . vancomycin 1,000 mg (02/11/17 0505)     LOS: 4 days    Time spent: Total of 15 minutes spent with pt, greater than 50% of which was spent in discussion of  treatment, counseling and coordination of care    Chipper Oman, MD Pager: Text Page via [email or web address]  [phone]  If 7PM-7AM, please contact night-coverage [email or web address] Password [facility] 02/11/2017, 9:36 AM

## 2017-02-11 NOTE — Progress Notes (Signed)
Patient ID: [name], male   DOB: 1945/12/18, 71 y.o.   MRN: [id number]    Referring Physician(s): * No referring provider recorded for this case *  Supervising Physician: Jacqulynn Cadet  Patient Status: [facility] - In-pt  Chief Complaint: Left foot wound and cellulitis secondary to PAD  Subjective: Patient says he feels better today and his foot looks better.  Wants to go home to pay his bills  Allergies: Patient has no known allergies.  Medications: Prior to Admission medications   Medication Sig Start Date End Date Taking? Authorizing Provider  acetaminophen (TYLENOL) 500 MG tablet Take 1,000 mg by mouth 2 (two) times daily as needed for headache. For pain/headaches   Yes [provider]  alprazolam Duanne Moron) 2 MG tablet Take 1 mg by mouth 3 (three) times daily.    Yes [provider]  amiodarone (PACERONE) 200 MG tablet Take 1 tablet (200 mg total) by mouth daily. 10/14/16  Yes Hilty, Nadean Corwin, MD  apixaban (ELIQUIS) 5 MG TABS tablet Take 1 tablet (5 mg total) by mouth 2 (two) times daily. 10/14/16  Yes Hilty, Nadean Corwin, MD  atorvastatin (LIPITOR) 20 MG tablet Take 1 tablet (20 mg total) by mouth daily at 6 PM. 06/18/16  Yes Rehman, Mechele Dawley, MD  fenofibrate (TRICOR) 145 MG tablet TAKE ONE TABLET EACH DAY. 11/01/16  Yes Hilty, Nadean Corwin, MD  ferrous sulfate 325 (65 FE) MG EC tablet Take 325 mg by mouth daily with breakfast.   Yes [provider]  gabapentin (NEURONTIN) 300 MG capsule Take 300 mg by mouth 2 (two) times daily.    Yes [provider]  insulin glargine (LANTUS) 100 UNIT/ML injection Inject 0.6-0.75 mLs (60-75 Units total) into the skin 2 (two) times daily. 75 units in the morning then 60 units at bedtime 02/25/16  Yes Hongalgi, Lenis Dickinson, MD  insulin lispro (HUMALOG) 100 UNIT/ML injection Inject 0.35-0.5 mLs (35-50 Units total) into the skin 3 (three) times daily before meals. Patient states that he takes 35 units at breakfast, 50 units  at dinner and 50 units at supper. Patient taking differently: Inject 35-45 Units into the skin 3 (three) times daily before meals. Patient states that he takes 35 units at breakfast, 50 units at dinner and 50 units at supper. 02/25/16  Yes Hongalgi, Lenis Dickinson, MD  INVOKANA 100 MG TABS tablet Take 1 tablet by mouth daily. 12/09/16  Yes [provider]  levothyroxine (SYNTHROID, LEVOTHROID) 25 MCG tablet Take 25 mcg by mouth daily before breakfast.   Yes [provider]  meclizine (ANTIVERT) 25 MG tablet Take 25 mg by mouth 3 (three) times daily as needed for dizziness.   Yes [provider]  metoprolol succinate (TOPROL-XL) 25 MG 24 hr tablet Take 1 tablet (25 mg total) by mouth daily. 10/14/16  Yes Hilty, Nadean Corwin, MD  Omega-3 Fatty Acids (FISH OIL) 1200 MG CAPS Take 1 capsule by mouth daily.    Yes [provider]  pantoprazole (PROTONIX) 40 MG tablet Take 1 tablet (40 mg total) by mouth 2 (two) times daily. 10/30/14  Yes Hilty, Nadean Corwin, MD  silver sulfADIAZINE (SILVADENE) 1 % cream Apply 1 application topically daily.   Yes [provider]  sulfamethoxazole-trimethoprim (BACTRIM DS,SEPTRA DS) 800-160 MG tablet Take 1 tablet by mouth 2 (two) times daily.   Yes [provider]  torsemide (DEMADEX) 10 MG tablet Take 1 tablet (10 mg total) by mouth daily. 10/14/16  Yes Hilty, Nadean Corwin, MD  amoxicillin-clavulanate (AUGMENTIN) 875-125 MG tablet Take 1 tablet by mouth 2 (two) times daily.  01/24/17   [provider]    Vital Signs: BP (!) 138/50 (BP Location: Left Arm)   Pulse 61   Temp 98.3 F (36.8 C) (Oral)   Resp 18   Ht 5\' 10"  (1.778 m)   Wt 238 lb 1.6 oz (108 kg)   SpO2 98%   BMI 34.16 kg/m   Physical Exam: Ext: left leg with palpable pedal pulse.  Faint post tib pulse is palpable.  Cellulitis has not progressed past line drawn.  I haven't seen his foot before, patient states his cellulitis is improved.  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 He presents for arteriogram and endovascular optimization of blood flow to the wound. EXAM: LEFT EXTREMITY ARTERIOGRAPHY; IR ULTRASOUND GUIDANCE VASC ACCESS RIGHT; IR FEM POP ART ATHERECT INC PTA MEDICATIONS: Patient is currently an inpatient receiving intravenous antibiotics. No additional antibiotics were administered. 8000 units heparin and 1100 mcg nitroglycerin were administered. ANESTHESIA/SEDATION: Moderate (conscious) sedation was employed during this procedure. A total of Versed 3.5 mg and Fentanyl 175 mcg was administered intravenously. Moderate Sedation Time: 125 minutes. The patient's level of consciousness and vital signs were monitored continuously by radiology nursing throughout the procedure under my direct supervision. CONTRAST:  60mL VISIPAQUE IODIXANOL 320 MG/ML IV SOLN, 81mL VISIPAQUE IODIXANOL 320 MG/ML IV SOLN FLUOROSCOPY TIME:  Fluoroscopy Time: 26 minutes 36 seconds (219 mGy). COMPLICATIONS: None immediate. PROCEDURE: Informed consent was obtained from the patient following explanation of the procedure, risks, benefits and alternatives. The patient understands, agrees and consents for the procedure. All questions were addressed. A time out was performed prior to the initiation of the procedure. Maximal barrier sterile technique utilized including caps, mask, sterile gowns, sterile gloves, large sterile drape, hand hygiene, and Betadine prep. The right common femoral artery was interrogated with ultrasound and found to be widely patent. An image was obtained and stored for the medical record. Local anesthesia was attained by infiltration with 1% lidocaine. A small dermatotomy was made. Under real-time sonographic guidance, the vessel was punctured with a 21 gauge micropuncture needle. Using standard technique, the initial micro needle was  exchanged over a 0.018 micro wire for a transitional 4 [place] micro sheath. The micro sheath was then exchanged over a 0.035 wire for a 5 French vascular sheath. An Omni flush catheter was advanced to the aortic bifurcation and pelvic arteriography was performed. Both internal iliac arteries are widely patent. The common and external iliac arteries are widely patent. The Omni flush catheter was then advanced up in over the aortic bifurcation and into the left common femoral artery. A multi station left lower extremity arteriogram was then performed. There is a multifocal calcified atherosclerotic plaque throughout the superficial femoral artery. There are multiple regions of a moderate and high-grade focal stenosis. The most critical stenosis is in the region of of the mid thigh were the vessel is approximately 90% stenosis. The distal most superficial femoral artery in the P1 segment of the popliteal artery are relatively spared from disease. However, there is a focal high-grade stenosis in the P2 segment of the popliteal artery just above the joint space. Significant runoff disease. Both the anterior tibial and posterior tibial arteries are occluded. The peroneal artery provides the dominant runoff to the foot and reconstitutes both the distal posterior tibial artery and the dorsalis pedis  artery. The plantar loop is intact. A CX I catheter was advanced over a Bentson wire into the distal popliteal artery. The Bentson wire was then exchanged for a by per wire. Multifocal orbital atherectomy was performed throughout the superficial femoral and popliteal artery easy using a 2.0 classic crown for the CS I Diamond back device. Following atherectomy, multi station drug-eluting balloon angioplasty was performed. The P2 segment popliteal artery was treated using a 5 x 120 IN.PACT balloon. The remaining superficial femoral artery was treated with minimally overlapping stations using two 6 x 150 IN.PACT balloons. There was  a focal stenosis in the mid superficial femoral artery at the site of the most severe stenosis which could not be completely opened using the IN.PACT balloon. This remained present with a moderate to high-grade residual focal stenosis on follow-up angiography. There are multifocal mild non flow limiting linear dissections. The remaining residual stenosis was then treated using a 5 x 40 mm are mottled balloon. The are mild balloon was successfully expanded to full effacement with rupture of the plaque. The balloon was left inflated for a prolonged period of time. Follow-up arteriography demonstrates improved luminal gain. There is no residual significant stenosis. Lateral foot arteriography was again performed demonstrating no evidence of distal embolic phenomenon. The wires and catheter were removed. Hemostasis was attained with the assistance of a Cordis Exoseal extra arterial vascular plug. The patient tolerated the procedure well. IMPRESSION: 1. Left lower extremity diagnostic arteriography demonstrates a diffusely diseased superficial femoral artery with multiple areas of moderate and high-grade focal stenosis secondary to heavily calcified atherosclerotic plaque. Additionally, there is a focal high-grade stenosis in the P2 segment of the popliteal artery. 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 2. Successful orbital atherectomy and drug coated balloon angioplasty of the popliteal artery to 5 mm, in the superficial femoral artery to 6 mm. PLAN: While blood flow is significantly improved, patient would likely benefit from reconstruction of the occluded tibial arteries. We will arrange for a second session arteriogram in the near future to attempt to re- vascularized the posterior tibial, and possibly the anterior tibial arteries. Recommend continued wound care and possible debridement. Patient  would like to avoid amputation if at all possible. Signed, Criselda Peaches, MD [facility] Electronically Signed   By: Jacqulynn Cadet M.D.   On: 02/10/2017 19:26   Mr Foot Left Wo Contrast  Result Date: 02/08/2017 CLINICAL DATA:  Pain and discoloration of the distal left foot. EXAM: MRI OF THE LEFT FOOT WITHOUT CONTRAST TECHNIQUE: Multiplanar, multisequence MR imaging of the left forefoot was performed. No intravenous contrast was administered. COMPARISON:  12/12/2016 FINDINGS: Bones/Joint/Cartilage Soft tissue wound along the plantar aspect of the first MTP joint. 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 7.3 x 20 mm complex fluid collection along the medial aspect of the first MTP joint concerning for an abscess. No acute fracture or dislocation. No other marrow signal abnormality. Normal alignment. No joint effusion. Ligaments Collateral ligaments are intact.  Lisfranc ligament is intact. Muscles and Tendons Flexor, peroneal and extensor compartment tendons are intact. No significant muscle atrophy. Mild diffuse increased T2 hyperintense signal throughout the muscles likely neurogenic given the patient's history diabetes versus less likely myositis. Soft tissue No  other fluid collection or hematoma.  No soft tissue mass. IMPRESSION: Soft tissue wound along the plantar aspect of the first MTP joint. 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 7.3 x 20 mm abscess along the medial aspect of the first MTP joint. Electronically Signed   By: Kathreen Devoid   On: 02/08/2017 08:25   [place] Pta Mod  Sed  Result Date: 02/10/2017 INDICATION: 71 year old male with Rutherford category 5/6 critical limb ischemia involving the left forefoot. He presents for arteriogram and endovascular optimization of blood flow to the wound. EXAM: LEFT EXTREMITY ARTERIOGRAPHY; IR ULTRASOUND GUIDANCE VASC ACCESS RIGHT; IR FEM POP ART ATHERECT INC PTA MEDICATIONS: Patient is currently an inpatient receiving intravenous antibiotics. No additional antibiotics were administered. 8000 units heparin and 1100 mcg nitroglycerin were administered. ANESTHESIA/SEDATION: Moderate (conscious) sedation was employed during this procedure. A total of Versed 3.5 mg and Fentanyl 175 mcg was administered intravenously. Moderate Sedation Time: 125 minutes. The patient's level of consciousness and vital signs were monitored continuously by radiology nursing throughout the procedure under my direct supervision. CONTRAST:  54mL VISIPAQUE IODIXANOL 320 MG/ML IV SOLN, 12mL VISIPAQUE IODIXANOL 320 MG/ML IV SOLN FLUOROSCOPY TIME:  Fluoroscopy Time: 26 minutes 36 seconds (219 mGy). COMPLICATIONS: None immediate. PROCEDURE: Informed consent was obtained from the patient following explanation of the procedure, risks, benefits and alternatives. The patient understands, agrees and consents for the procedure. All questions were addressed. A time out was performed prior to the initiation of the procedure. Maximal barrier sterile technique utilized including caps, mask, sterile gowns, sterile gloves, large sterile drape, hand hygiene, and Betadine prep. The right common femoral artery was interrogated with ultrasound and found to be widely patent. An image was obtained and stored for the medical record. Local anesthesia was attained by infiltration with 1% lidocaine. A small dermatotomy was made. Under real-time sonographic guidance, the vessel was punctured with a 21 gauge micropuncture needle. Using standard technique, the initial micro needle was exchanged over a  0.018 micro wire for a transitional 4 [place] micro sheath. The micro sheath was then exchanged over a 0.035 wire for a 5 French vascular sheath. An Omni flush catheter was advanced to the aortic bifurcation and pelvic arteriography was performed. Both internal iliac arteries are widely patent. The common and external iliac arteries are widely patent. The Omni flush catheter was then advanced up in over the aortic bifurcation and into the left common femoral artery. A multi station left lower extremity arteriogram was then performed. There is a multifocal calcified atherosclerotic plaque throughout the superficial femoral artery. There are multiple regions of a moderate and high-grade focal stenosis. The most critical stenosis is in the region of of the mid thigh were the vessel is approximately 90% stenosis. The distal most superficial femoral artery in the P1 segment of the popliteal artery are relatively spared from disease. However, there is a focal high-grade stenosis in the P2 segment of the popliteal artery just above the joint space. Significant runoff disease. Both the anterior tibial and posterior tibial arteries are occluded. The peroneal artery provides the dominant runoff to the foot and reconstitutes both the distal posterior tibial artery and the dorsalis pedis artery. The plantar loop is intact. A CX I catheter was advanced over a Bentson wire into the distal popliteal artery. The Bentson wire was then  exchanged for a by per wire. Multifocal orbital atherectomy was performed throughout the superficial femoral and popliteal artery easy using a 2.0 classic crown for the CS I Diamond back device. Following atherectomy, multi station drug-eluting balloon angioplasty was performed. The P2 segment popliteal artery was treated using a 5 x 120 IN.PACT balloon. The remaining superficial femoral artery was treated with minimally overlapping stations using two 6 x 150 IN.PACT balloons. There was a focal stenosis  in the mid superficial femoral artery at the site of the most severe stenosis which could not be completely opened using the IN.PACT balloon. This remained present with a moderate to high-grade residual focal stenosis on follow-up angiography. There are multifocal mild non flow limiting linear dissections. The remaining residual stenosis was then treated using a 5 x 40 mm are mottled balloon. The are mild balloon was successfully expanded to full effacement with rupture of the plaque. The balloon was left inflated for a prolonged period of time. Follow-up arteriography demonstrates improved luminal gain. There is no residual significant stenosis. Lateral foot arteriography was again performed demonstrating no evidence of distal embolic phenomenon. The wires and catheter were removed. Hemostasis was attained with the assistance of a Cordis Exoseal extra arterial vascular plug. The patient tolerated the procedure well. IMPRESSION: 1. Left lower extremity diagnostic arteriography demonstrates a diffusely diseased superficial femoral artery with multiple areas of moderate and high-grade focal stenosis secondary to heavily calcified atherosclerotic plaque. Additionally, there is a focal high-grade stenosis in the P2 segment of the popliteal artery. 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 2. Successful orbital atherectomy and drug coated balloon angioplasty of the popliteal artery to 5 mm, in the superficial femoral artery to 6 mm. PLAN: While blood flow is significantly improved, patient would likely benefit from reconstruction of the occluded tibial arteries. We will arrange for a second session arteriogram in the near future to attempt to re- vascularized the posterior tibial, and possibly the anterior tibial arteries. Recommend continued wound care and possible debridement. Patient would like to avoid  amputation if at all possible. Signed, Criselda Peaches, MD [facility] Radiology Electronically Signed   By: Jacqulynn Cadet M.D.   On: 02/10/2017 19:26   Ir US Guide Vasc Access Right  Result Date: 02/10/2017 INDICATION: 71 year old male with Rutherford category 5/6 critical limb ischemia involving the left forefoot. He presents for arteriogram and endovascular optimization of blood flow to the wound. EXAM: LEFT EXTREMITY ARTERIOGRAPHY; IR ULTRASOUND GUIDANCE VASC ACCESS RIGHT; IR FEM POP ART ATHERECT INC PTA MEDICATIONS: Patient is currently an inpatient receiving intravenous antibiotics. No additional antibiotics were administered. 8000 units heparin and 1100 mcg nitroglycerin were administered. ANESTHESIA/SEDATION: Moderate (conscious) sedation was employed during this procedure. A total of Versed 3.5 mg and Fentanyl 175 mcg was administered intravenously. Moderate Sedation Time: 125 minutes. The patient's level of consciousness and vital signs were monitored continuously by radiology nursing throughout the procedure under my direct supervision. CONTRAST:  27mL VISIPAQUE IODIXANOL 320 MG/ML IV SOLN, 72mL VISIPAQUE IODIXANOL 320 MG/ML IV SOLN FLUOROSCOPY TIME:  Fluoroscopy Time: 26 minutes 36 seconds (219 mGy). COMPLICATIONS: None immediate. PROCEDURE: Informed consent was obtained from the patient following explanation of the procedure, risks, benefits and alternatives. The patient understands, agrees and consents for the procedure. All questions were addressed. A time out was performed prior to the initiation of the procedure. Maximal barrier sterile technique utilized  including caps, mask, sterile gowns, sterile gloves, large sterile drape, hand hygiene, and Betadine prep. The right common femoral artery was interrogated with ultrasound and found to be widely patent. An image was obtained and stored for the medical record. Local anesthesia was attained  by infiltration with 1% lidocaine. A small dermatotomy was made. Under real-time sonographic guidance, the vessel was punctured with a 21 gauge micropuncture needle. Using standard technique, the initial micro needle was exchanged over a 0.018 micro wire for a transitional 4 [place] micro sheath. The micro sheath was then exchanged over a 0.035 wire for a 5 French vascular sheath. An Omni flush catheter was advanced to the aortic bifurcation and pelvic arteriography was performed. Both internal iliac arteries are widely patent. The common and external iliac arteries are widely patent. The Omni flush catheter was then advanced up in over the aortic bifurcation and into the left common femoral artery. A multi station left lower extremity arteriogram was then performed. There is a multifocal calcified atherosclerotic plaque throughout the superficial femoral artery. There are multiple regions of a moderate and high-grade focal stenosis. The most critical stenosis is in the region of of the mid thigh were the vessel is approximately 90% stenosis. The distal most superficial femoral artery in the P1 segment of the popliteal artery are relatively spared from disease. However, there is a focal high-grade stenosis in the P2 segment of the popliteal artery just above the joint space. Significant runoff disease. Both the anterior tibial and posterior tibial arteries are occluded. The peroneal artery provides the dominant runoff to the foot and reconstitutes both the distal posterior tibial artery and the dorsalis pedis artery. The plantar loop is intact. A CX I catheter was advanced over a Bentson wire into the distal popliteal artery. The Bentson wire was then exchanged for a by per wire. Multifocal orbital atherectomy was performed throughout the superficial femoral and popliteal artery easy using a 2.0 classic crown for the CS I Diamond back device. Following atherectomy, multi station drug-eluting balloon angioplasty was  performed. The P2 segment popliteal artery was treated using a 5 x 120 IN.PACT balloon. The remaining superficial femoral artery was treated with minimally overlapping stations using two 6 x 150 IN.PACT balloons. There was a focal stenosis in the mid superficial femoral artery at the site of the most severe stenosis which could not be completely opened using the IN.PACT balloon. This remained present with a moderate to high-grade residual focal stenosis on follow-up angiography. There are multifocal mild non flow limiting linear dissections. The remaining residual stenosis was then treated using a 5 x 40 mm are mottled balloon. The are mild balloon was successfully expanded to full effacement with rupture of the plaque. The balloon was left inflated for a prolonged period of time. Follow-up arteriography demonstrates improved luminal gain. There is no residual significant stenosis. Lateral foot arteriography was again performed demonstrating no evidence of distal embolic phenomenon. The wires and catheter were removed. Hemostasis was attained with the assistance of a Cordis Exoseal extra arterial vascular plug. The patient tolerated the procedure well. IMPRESSION: 1. Left lower extremity diagnostic arteriography demonstrates a diffusely diseased superficial femoral artery with multiple areas of moderate and high-grade focal stenosis secondary to heavily calcified atherosclerotic plaque. Additionally, there is a focal high-grade stenosis in the P2 segment of the popliteal artery. 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 2. Successful orbital atherectomy and drug  coated balloon angioplasty of the popliteal artery to 5 mm, in the superficial femoral artery to 6 mm. PLAN: While blood flow is significantly improved, patient would likely benefit from reconstruction of the occluded tibial arteries. We will  arrange for a second session arteriogram in the near future to attempt to re- vascularized the posterior tibial, and possibly the anterior tibial arteries. Recommend continued wound care and possible debridement. Patient would like to avoid amputation if at all possible. Signed, Criselda Peaches, MD [facility] Radiology Electronically Signed   By: Jacqulynn Cadet M.D.   On: 02/10/2017 19:26    Labs:  CBC:  Recent Labs  02/08/17 0552 02/09/17 0541 02/10/17 1111 02/11/17 0421  WBC 7.0 6.4 6.5 6.7  HGB 11.2* 11.1* 12.6* 11.4*  HCT 35.4* 34.2* 39.8 36.1*  PLT 234 209 243 190    COAGS:  Recent Labs  02/23/16 1956 02/10/17 0413  INR 1.20 1.08  APTT 33  --     BMP:  Recent Labs  02/08/17 0552 02/09/17 0541 02/10/17 1111 02/11/17 0421  NA 137 135 137 135  K 4.3 4.2 4.7 4.4  CL 102 100* 102 102  CO2 27 28 26 26   GLUCOSE 218* 230* 198* 294*  BUN 21* 20 17 17   CALCIUM 8.7* 8.8* 9.6 8.7*  CREATININE 1.34* 1.37* 1.23 1.21  GFRNONAA 53* 51* 59* 60*  GFRAA >60 60* >60 >60    LIVER FUNCTION TESTS:  Recent Labs  02/23/16 1956 04/14/16 1059 12/12/16 1259  BILITOT 0.8 0.5 0.4  AST 52* 41* 29  ALT 62 52* 29  ALKPHOS 43 39* 48  PROT 6.2* 6.6 7.0  ALBUMIN 4.0 4.3 3.8    Assessment and Plan: 1. Left foot cellulitis with PAD, s/p Orbital atherectomy and DCB-PTA of multifocal severe stenoses throughout the SFA and popliteal arteries  Patient says he feels better today.  He will still need further intervention on our behalf regarding his tibial vessels.  Ortho has seen him and still needs to weigh in regarding their plans early this coming week.  We will follow along so if we can proceed with a second intervention we try sometime next week pending Dr. Katrinka Blazing schedule.  Electronically Signed: Henreitta Cea 02/11/2017, 10:39 AM   I spent a total of 15 Minutes at the the patient's bedside AND on the patient's  hospital floor or unit, greater than 50% of which was counseling/coordinating care for PAD

## 2017-02-12 LAB — GLUCOSE, CAPILLARY
GLUCOSE-CAPILLARY: 325 mg/dL — AB (ref 65–99)
GLUCOSE-CAPILLARY: 272 mg/dL — AB (ref 65–99)
Glucose-Capillary: 306 mg/dL — ABNORMAL HIGH (ref 65–99)
Glucose-Capillary: 343 mg/dL — ABNORMAL HIGH (ref 65–99)

## 2017-02-12 LAB — CULTURE, BLOOD (ROUTINE X 2)
CULTURE: NO GROWTH
Culture: NO GROWTH
SPECIAL REQUESTS: ADEQUATE

## 2017-02-12 NOTE — Progress Notes (Signed)
PROGRESS NOTE [facility]   [name]   CBU:[id number] DOB: 04/17/1946  DOA: 02/07/2017 PCP: Redmond School, MD   Brief Narrative:  71 year old man admitted to the hospital on 6/26 from his PCPs office due to worsening edema and erythema of his left foot. MRI shows evidence for osteomyelitis, septic arthritis and an abscess along the aspect of the first MTP joint and admission has been requested. Seen by general surgery today who is recommended transfer to [facility] for evaluation by vascular surgery for potential revascularization and amputation. IR has evaluated patient and schedule for revascularization tonight.   Subjective: Doing well, have no complaints today.   Assessment & Plan: - No changes in plan for now   Left foot osteomyelitis -MRI shows severe extensive bone destruction of the distal half of the first metatarsal and base of the first proximal phalanx centered around the first MTP joint most consistent with septic arthritis and osteomyelitis as well as an abscess along the medial aspect of the first MTP joint. -Patient was admitted for cellulitis in the same area with no signs of bone infection back in April 2018 -Will continue Cefepime and Vanc for now -Patient s/p angiogram, with good improve blood flow to foot. IR planning to perform another intervention.  -Ortho to evaluate in AM for possible I&D vs amputation  -Continue to hold anticoagulation in view of procedure   Peripheral vascular disease -04/23/15--right lower extremity arteriogram with recanalization of the right SFA occlusion and angioplasty/stenting of the right SFA and popliteal artery -10/07/15--right atherectomy and drug-eluting balloon angioplasty of the distal SFA/popliteal stenosis as well as atherectomy and angioplasty of a peroneal artery stenosis -12/12/16--ABIwere 0.41 on the right and 0.62 on the left, previously 0.66 and 0.61 respectively -02/10/17 s/p LLE angiogram and  orbital atherectomy w DCB-PTA of multiple severe stenoses throughout the SFA and pop arteries.   Paroxysmal A. Fib - remains stable  -Currently in sinus rhythm and rate controlled -Continue amiodarone and metoprolol, -Continue to hold eliquis in view of possible procedure   Type 2 diabetes  -CBGs remain uncontrolled. -Lantus was on hold due to patient been NPO, will resume now  -Continue SSI and monitor CBG's   Hyperlipidemia  -continue Lovaza  Hypothyroidism -Continue Synthroid  Stage III chronic kidney disease -Baseline creatinine between 1.2 and 1.5 -Cr remains at baseline   Chronic diastolic CHF -Echo in March 2018 with an ejection fraction of 55% and grade 1 diastolic dysfunction. -Is clinically euvolemic  DVT prophylaxis: SCD's  Code Status: FULL  Family Communication: None at bedside  Disposition Plan: Home when medically stable   Consultants:   IR   Orthopedic surgery   Procedures:   None   Antimicrobials: Anti-infectives    Start     Dose/Rate Route Frequency Ordered Stop   02/09/17 2200  ceFEPIme (MAXIPIME) 2 g in dextrose 5 % 50 mL IVPB     2 g 100 mL/hr over 30 Minutes Intravenous Every 12 hours 02/09/17 0930     02/08/17 1000  ceFEPIme (MAXIPIME) 2 g in dextrose 5 % 50 mL IVPB  Status:  Discontinued     2 g 100 mL/hr over 30 Minutes Intravenous Every 24 hours 02/07/17 1644 02/09/17 0930   02/08/17 0600  vancomycin (VANCOCIN) IVPB 1000 mg/200 mL premix     1,000 mg 200 mL/hr over 60 Minutes Intravenous Every 12 hours 02/07/17 1643     02/07/17 1445  vancomycin (VANCOCIN) 1,500 mg in sodium chloride 0.9 % 500  mL IVPB     1,500 mg 250 mL/hr over 120 Minutes Intravenous  Once 02/07/17 1433 02/08/17 0009   02/07/17 1445  ceFEPIme (MAXIPIME) 2 g in dextrose 5 % 50 mL IVPB     2 g 100 mL/hr over 30 Minutes Intravenous  Once 02/07/17 1433 02/07/17 2200         Objective: Vitals:   02/11/17 1448 02/11/17 2113 02/12/17 0453 02/12/17 1313    BP: (!) 146/50 136/60 (!) 127/55 (!) 137/53  Pulse: 63 67 60 65  Resp:  18 18 19   Temp: 98.2 F (36.8 C) 98.2 F (36.8 C) 98.2 F (36.8 C) 98.2 F (36.8 C)  TempSrc: Oral Oral Oral Oral  SpO2: 98% 98% 97% 100%  Weight:      Height:        Intake/Output Summary (Last 24 hours) at 02/12/17 1335 Last data filed at 02/12/17 0934  Gross per 24 hour  Intake              420 ml  Output             1776 ml  Net            -1356 ml   Filed Weights   02/08/17 0500 02/09/17 0440 02/11/17 1244  Weight: 106.6 kg (235 lb) 108 kg (238 lb 1.6 oz) 108.2 kg (238 lb 9.6 oz)    Examination: No changes on exam from 02/11/17  General: Pt is alert, awake, not in acute distress Cardiovascular: RRR, S1/S2 +, no rubs, no gallops Respiratory: CTA bilaterally, no wheezing, no rhonchi Abdominal: Soft, NT, ND, bowel sounds + Extremities: no edema, no cyanosis, Pulses faint  Skin: see picture below, improving      Admission        02/11/2017   Data Reviewed: I have personally reviewed following labs and imaging studies  CBC:  Recent Labs Lab 02/07/17 1540 02/08/17 0552 02/09/17 0541 02/10/17 1111 02/11/17 0421  WBC 7.4 7.0 6.4 6.5 6.7  NEUTROABS  --   --   --   --  4.0  HGB 11.9* 11.2* 11.1* 12.6* 11.4*  HCT 36.7* 35.4* 34.2* 39.8 36.1*  MCV 88.0 88.5 86.6 87.5 87.4  PLT 252 234 209 243 409   Basic Metabolic Panel:  Recent Labs Lab 02/07/17 1540 02/08/17 0552 02/09/17 0541 02/10/17 1111 02/11/17 0421  NA 136 137 135 137 135  K 4.1 4.3 4.2 4.7 4.4  CL 100* 102 100* 102 102  CO2 26 27 28 26 26   GLUCOSE 220* 218* 230* 198* 294*  BUN 24* 21* 20 17 17   CREATININE 1.58* 1.34* 1.37* 1.23 1.21  CALCIUM 9.1 8.7* 8.8* 9.6 8.7*   GFR: Estimated Creatinine Clearance: 72 mL/min (by C-G formula based on SCr of 1.21 mg/dL). Liver Function Tests: No results for input(s): AST, ALT, ALKPHOS, BILITOT, PROT, ALBUMIN in the last 168 hours. No results for input(s): LIPASE, AMYLASE in the  last 168 hours. No results for input(s): AMMONIA in the last 168 hours. Coagulation Profile:  Recent Labs Lab 02/10/17 0413  INR 1.08   Cardiac Enzymes: No results for input(s): CKTOTAL, CKMB, CKMBINDEX, TROPONINI in the last 168 hours. BNP (last 3 results) No results for input(s): PROBNP in the last 8760 hours. HbA1C: No results for input(s): HGBA1C in the last 72 hours. CBG:  Recent Labs Lab 02/11/17 1250 02/11/17 1708 02/11/17 2112 02/12/17 0737 02/12/17 1152  GLUCAP 302* 312* 362* 306* 325*   Lipid Profile: No results  for input(s): CHOL, HDL, LDLCALC, TRIG, CHOLHDL, LDLDIRECT in the last 72 hours. Thyroid Function Tests: No results for input(s): TSH, T4TOTAL, FREET4, T3FREE, THYROIDAB in the last 72 hours. Anemia Panel: No results for input(s): VITAMINB12, FOLATE, FERRITIN, TIBC, IRON, RETICCTPCT in the last 72 hours. Sepsis Labs: No results for input(s): PROCALCITON, LATICACIDVEN in the last 168 hours.  Recent Results (from the past 240 hour(s))  Culture, blood (Routine X 2) w Reflex to ID Panel     Status: None   Collection Time: 02/07/17  3:40 PM  Result Value Ref Range Status   Specimen Description   Final    RIGHT ANTECUBITAL BOTTLES DRAWN AEROBIC AND ANAEROBIC   Special Requests Blood Culture adequate volume  Final   Culture NO GROWTH 5 DAYS  Final   Report Status 02/12/2017 FINAL  Final  Culture, blood (Routine X 2) w Reflex to ID Panel     Status: None   Collection Time: 02/07/17  3:40 PM  Result Value Ref Range Status   Specimen Description   Final    RIGHT ANTECUBITAL BOTTLES DRAWN AEROBIC AND ANAEROBIC   Special Requests   Final    Blood Culture results may not be optimal due to an inadequate volume of blood received in culture bottles   Culture NO GROWTH 5 DAYS  Final   Report Status 02/12/2017 FINAL  Final  MRSA PCR Screening     Status: None   Collection Time: 02/09/17  5:09 PM  Result Value Ref Range Status   MRSA by PCR NEGATIVE NEGATIVE  Final    Comment:        The GeneXpert MRSA Assay (FDA approved for NASAL specimens only), is one component of a comprehensive MRSA colonization surveillance program. It is not intended to diagnose MRSA infection nor to guide or monitor treatment for MRSA infections.      Radiology Studies: Ir Angiogram Extremity Left  Result Date: 02/10/2017 INDICATION: 71 year old male with Rutherford category 5/6 critical limb ischemia involving the left forefoot. He presents for arteriogram and endovascular optimization of blood flow to the wound. EXAM: LEFT EXTREMITY ARTERIOGRAPHY; IR ULTRASOUND GUIDANCE VASC ACCESS RIGHT; IR FEM POP ART ATHERECT INC PTA MEDICATIONS: Patient is currently an inpatient receiving intravenous antibiotics. No additional antibiotics were administered. 8000 units heparin and 1100 mcg nitroglycerin were administered. ANESTHESIA/SEDATION: Moderate (conscious) sedation was employed during this procedure. A total of Versed 3.5 mg and Fentanyl 175 mcg was administered intravenously. Moderate Sedation Time: 125 minutes. The patient's level of consciousness and vital signs were monitored continuously by radiology nursing throughout the procedure under my direct supervision. CONTRAST:  61mL VISIPAQUE IODIXANOL 320 MG/ML IV SOLN, 20mL VISIPAQUE IODIXANOL 320 MG/ML IV SOLN FLUOROSCOPY TIME:  Fluoroscopy Time: 26 minutes 36 seconds (219 mGy). COMPLICATIONS: None immediate. PROCEDURE: Informed consent was obtained from the patient following explanation of the procedure, risks, benefits and alternatives. The patient understands, agrees and consents for the procedure. All questions were addressed. A time out was performed prior to the initiation of the procedure. Maximal barrier sterile technique utilized including caps, mask, sterile gowns, sterile gloves, large sterile drape, hand hygiene, and Betadine prep. The right common femoral artery was interrogated with ultrasound and found to be widely  patent. An image was obtained and stored for the medical record. Local anesthesia was attained by infiltration with 1% lidocaine. A small dermatotomy was made. Under real-time sonographic guidance, the vessel was punctured with a 21 gauge micropuncture needle. Using standard technique, the initial micro needle  was exchanged over a 0.018 micro wire for a transitional 4 [place] micro sheath. The micro sheath was then exchanged over a 0.035 wire for a 5 French vascular sheath. An Omni flush catheter was advanced to the aortic bifurcation and pelvic arteriography was performed. Both internal iliac arteries are widely patent. The common and external iliac arteries are widely patent. The Omni flush catheter was then advanced up in over the aortic bifurcation and into the left common femoral artery. A multi station left lower extremity arteriogram was then performed. There is a multifocal calcified atherosclerotic plaque throughout the superficial femoral artery. There are multiple regions of a moderate and high-grade focal stenosis. The most critical stenosis is in the region of of the mid thigh were the vessel is approximately 90% stenosis. The distal most superficial femoral artery in the P1 segment of the popliteal artery are relatively spared from disease. However, there is a focal high-grade stenosis in the P2 segment of the popliteal artery just above the joint space. Significant runoff disease. Both the anterior tibial and posterior tibial arteries are occluded. The peroneal artery provides the dominant runoff to the foot and reconstitutes both the distal posterior tibial artery and the dorsalis pedis artery. The plantar loop is intact. A CX I catheter was advanced over a Bentson wire into the distal popliteal artery. The Bentson wire was then exchanged for a by per wire. Multifocal orbital atherectomy was performed throughout the superficial femoral and popliteal artery easy using a 2.0 classic crown for the CS I  Diamond back device. Following atherectomy, multi station drug-eluting balloon angioplasty was performed. The P2 segment popliteal artery was treated using a 5 x 120 IN.PACT balloon. The remaining superficial femoral artery was treated with minimally overlapping stations using two 6 x 150 IN.PACT balloons. There was a focal stenosis in the mid superficial femoral artery at the site of the most severe stenosis which could not be completely opened using the IN.PACT balloon. This remained present with a moderate to high-grade residual focal stenosis on follow-up angiography. There are multifocal mild non flow limiting linear dissections. The remaining residual stenosis was then treated using a 5 x 40 mm are mottled balloon. The are mild balloon was successfully expanded to full effacement with rupture of the plaque. The balloon was left inflated for a prolonged period of time. Follow-up arteriography demonstrates improved luminal gain. There is no residual significant stenosis. Lateral foot arteriography was again performed demonstrating no evidence of distal embolic phenomenon. The wires and catheter were removed. Hemostasis was attained with the assistance of a Cordis Exoseal extra arterial vascular plug. The patient tolerated the procedure well. IMPRESSION: 1. Left lower extremity diagnostic arteriography demonstrates a diffusely diseased superficial femoral artery with multiple areas of moderate and high-grade focal stenosis secondary to heavily calcified atherosclerotic plaque. Additionally, there is a focal high-grade stenosis in the P2 segment of the popliteal artery. 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 2. Successful orbital atherectomy and drug coated balloon angioplasty of the popliteal artery to 5 mm, in the superficial femoral artery to 6 mm. PLAN: While blood flow is significantly  improved, patient would likely benefit from reconstruction of the occluded tibial arteries. We will arrange for a second session arteriogram in the near future to attempt to re- vascularized the posterior tibial, and possibly the anterior tibial arteries. Recommend continued wound care and possible debridement. Patient would like to avoid amputation  if at all possible. Signed, Criselda Peaches, MD [facility] Electronically Signed   By: Jacqulynn Cadet M.D.   On: 02/10/2017 19:26   Pasatiempo Pta Mod Sed  Result Date: 02/10/2017 INDICATION: 71 year old male with Rutherford category 5/6 critical limb ischemia involving the left forefoot. He presents for arteriogram and endovascular optimization of blood flow to the wound. EXAM: LEFT EXTREMITY ARTERIOGRAPHY; IR ULTRASOUND GUIDANCE VASC ACCESS RIGHT; IR FEM POP ART ATHERECT INC PTA MEDICATIONS: Patient is currently an inpatient receiving intravenous antibiotics. No additional antibiotics were administered. 8000 units heparin and 1100 mcg nitroglycerin were administered. ANESTHESIA/SEDATION: Moderate (conscious) sedation was employed during this procedure. A total of Versed 3.5 mg and Fentanyl 175 mcg was administered intravenously. Moderate Sedation Time: 125 minutes. The patient's level of consciousness and vital signs were monitored continuously by radiology nursing throughout the procedure under my direct supervision. CONTRAST:  52mL VISIPAQUE IODIXANOL 320 MG/ML IV SOLN, 56mL VISIPAQUE IODIXANOL 320 MG/ML IV SOLN FLUOROSCOPY TIME:  Fluoroscopy Time: 26 minutes 36 seconds (219 mGy). COMPLICATIONS: None immediate. PROCEDURE: Informed consent was obtained from the patient following explanation of the procedure, risks, benefits and alternatives. The patient understands, agrees and consents for the procedure. All questions were addressed. A time out was performed prior to the initiation  of the procedure. Maximal barrier sterile technique utilized including caps, mask, sterile gowns, sterile gloves, large sterile drape, hand hygiene, and Betadine prep. The right common femoral artery was interrogated with ultrasound and found to be widely patent. An image was obtained and stored for the medical record. Local anesthesia was attained by infiltration with 1% lidocaine. A small dermatotomy was made. Under real-time sonographic guidance, the vessel was punctured with a 21 gauge micropuncture needle. Using standard technique, the initial micro needle was exchanged over a 0.018 micro wire for a transitional 4 [place] micro sheath. The micro sheath was then exchanged over a 0.035 wire for a 5 French vascular sheath. An Omni flush catheter was advanced to the aortic bifurcation and pelvic arteriography was performed. Both internal iliac arteries are widely patent. The common and external iliac arteries are widely patent. The Omni flush catheter was then advanced up in over the aortic bifurcation and into the left common femoral artery. A multi station left lower extremity arteriogram was then performed. There is a multifocal calcified atherosclerotic plaque throughout the superficial femoral artery. There are multiple regions of a moderate and high-grade focal stenosis. The most critical stenosis is in the region of of the mid thigh were the vessel is approximately 90% stenosis. The distal most superficial femoral artery in the P1 segment of the popliteal artery are relatively spared from disease. However, there is a focal high-grade stenosis in the P2 segment of the popliteal artery just above the joint space. Significant runoff disease. Both the anterior tibial and posterior tibial arteries are occluded. The peroneal artery provides the dominant runoff to the foot and reconstitutes both the distal posterior tibial artery and the dorsalis pedis artery. The plantar loop is intact. A CX I catheter was advanced  over a Bentson wire into the distal popliteal artery. The Bentson wire was then exchanged for a by per wire. Multifocal orbital atherectomy was performed throughout the superficial femoral and popliteal artery easy using a 2.0 classic crown for the CS I Diamond back device. Following atherectomy, multi station drug-eluting balloon angioplasty was performed. The P2 segment popliteal artery was treated using a 5 x 120  IN.PACT balloon. The remaining superficial femoral artery was treated with minimally overlapping stations using two 6 x 150 IN.PACT balloons. There was a focal stenosis in the mid superficial femoral artery at the site of the most severe stenosis which could not be completely opened using the IN.PACT balloon. This remained present with a moderate to high-grade residual focal stenosis on follow-up angiography. There are multifocal mild non flow limiting linear dissections. The remaining residual stenosis was then treated using a 5 x 40 mm are mottled balloon. The are mild balloon was successfully expanded to full effacement with rupture of the plaque. The balloon was left inflated for a prolonged period of time. Follow-up arteriography demonstrates improved luminal gain. There is no residual significant stenosis. Lateral foot arteriography was again performed demonstrating no evidence of distal embolic phenomenon. The wires and catheter were removed. Hemostasis was attained with the assistance of a Cordis Exoseal extra arterial vascular plug. The patient tolerated the procedure well. IMPRESSION: 1. Left lower extremity diagnostic arteriography demonstrates a diffusely diseased superficial femoral artery with multiple areas of moderate and high-grade focal stenosis secondary to heavily calcified atherosclerotic plaque. Additionally, there is a focal high-grade stenosis in the P2 segment of the popliteal artery. Advanced runoff disease with chronic occlusion of the posterior tibial and anterior tibial  arteries with reconstitution via peroneal collaterals at the ankle. 2. Successful orbital atherectomy and drug coated balloon angioplasty of the popliteal artery to 5 mm, in the superficial femoral artery to 6 mm. PLAN: While blood flow is significantly improved, patient would likely benefit from reconstruction of the occluded tibial arteries. We will arrange for a second session arteriogram in the near future to attempt to re- vascularized the posterior tibial, and possibly the anterior tibial arteries. Recommend continued wound care and possible debridement. Patient would like to avoid amputation if at all possible. Signed, Criselda Peaches, MD [facility] Electronically Signed   By: Jacqulynn Cadet M.D.   On: 02/10/2017 19:26   Ir US Guide Vasc Access Right  Result Date: 02/10/2017 INDICATION: 71 year old male with Rutherford category 5/6 critical limb ischemia involving the left forefoot. He presents for arteriogram and endovascular optimization of blood flow to the wound. EXAM: LEFT EXTREMITY ARTERIOGRAPHY; IR ULTRASOUND GUIDANCE VASC ACCESS RIGHT; IR FEM POP ART ATHERECT INC PTA MEDICATIONS: Patient is currently an inpatient receiving intravenous antibiotics. No additional antibiotics were administered. 8000 units heparin and 1100 mcg nitroglycerin were administered. ANESTHESIA/SEDATION: Moderate (conscious) sedation was employed during this procedure. A total of Versed 3.5 mg and Fentanyl 175 mcg was administered intravenously. Moderate Sedation Time: 125 minutes. The patient's level of consciousness and vital signs were monitored continuously by radiology nursing throughout the procedure under my direct supervision. CONTRAST:  38mL VISIPAQUE IODIXANOL 320 MG/ML IV SOLN, 33mL VISIPAQUE IODIXANOL 320 MG/ML IV SOLN FLUOROSCOPY TIME:  Fluoroscopy Time: 26 minutes 36 seconds (219 mGy). COMPLICATIONS: None immediate. PROCEDURE: Informed consent  was obtained from the patient following explanation of the procedure, risks, benefits and alternatives. The patient understands, agrees and consents for the procedure. All questions were addressed. A time out was performed prior to the initiation of the procedure. Maximal barrier sterile technique utilized including caps, mask, sterile gowns, sterile gloves, large sterile drape, hand hygiene, and Betadine prep. The right common femoral artery was interrogated with ultrasound and found to be widely patent. An image was obtained and stored for the medical record. Local anesthesia was attained by infiltration with 1% lidocaine. A small dermatotomy  was made. Under real-time sonographic guidance, the vessel was punctured with a 21 gauge micropuncture needle. Using standard technique, the initial micro needle was exchanged over a 0.018 micro wire for a transitional 4 [place] micro sheath. The micro sheath was then exchanged over a 0.035 wire for a 5 French vascular sheath. An Omni flush catheter was advanced to the aortic bifurcation and pelvic arteriography was performed. Both internal iliac arteries are widely patent. The common and external iliac arteries are widely patent. The Omni flush catheter was then advanced up in over the aortic bifurcation and into the left common femoral artery. A multi station left lower extremity arteriogram was then performed. There is a multifocal calcified atherosclerotic plaque throughout the superficial femoral artery. There are multiple regions of a moderate and high-grade focal stenosis. The most critical stenosis is in the region of of the mid thigh were the vessel is approximately 90% stenosis. The distal most superficial femoral artery in the P1 segment of the popliteal artery are relatively spared from disease. However, there is a focal high-grade stenosis in the P2 segment of the popliteal artery just above the joint space. Significant runoff disease. Both the anterior tibial and  posterior tibial arteries are occluded. The peroneal artery provides the dominant runoff to the foot and reconstitutes both the distal posterior tibial artery and the dorsalis pedis artery. The plantar loop is intact. A CX I catheter was advanced over a Bentson wire into the distal popliteal artery. The Bentson wire was then exchanged for a by per wire. Multifocal orbital atherectomy was performed throughout the superficial femoral and popliteal artery easy using a 2.0 classic crown for the CS I Diamond back device. Following atherectomy, multi station drug-eluting balloon angioplasty was performed. The P2 segment popliteal artery was treated using a 5 x 120 IN.PACT balloon. The remaining superficial femoral artery was treated with minimally overlapping stations using two 6 x 150 IN.PACT balloons. There was a focal stenosis in the mid superficial femoral artery at the site of the most severe stenosis which could not be completely opened using the IN.PACT balloon. This remained present with a moderate to high-grade residual focal stenosis on follow-up angiography. There are multifocal mild non flow limiting linear dissections. The remaining residual stenosis was then treated using a 5 x 40 mm are mottled balloon. The are mild balloon was successfully expanded to full effacement with rupture of the plaque. The balloon was left inflated for a prolonged period of time. Follow-up arteriography demonstrates improved luminal gain. There is no residual significant stenosis. Lateral foot arteriography was again performed demonstrating no evidence of distal embolic phenomenon. The wires and catheter were removed. Hemostasis was attained with the assistance of a Cordis Exoseal extra arterial vascular plug. The patient tolerated the procedure well. IMPRESSION: 1. Left lower extremity diagnostic arteriography demonstrates a diffusely diseased superficial femoral artery with multiple areas of moderate and high-grade focal  stenosis secondary to heavily calcified atherosclerotic plaque. Additionally, there is a focal high-grade stenosis in the P2 segment of the popliteal artery. 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 2. Successful orbital atherectomy and drug coated balloon angioplasty of the popliteal artery to 5 mm, in the superficial femoral artery to 6 mm. PLAN: While blood flow is significantly improved, patient would likely benefit from reconstruction of the occluded tibial arteries. We will arrange for a second session arteriogram in the near future to attempt to re-  vascularized the posterior tibial, and possibly the anterior tibial arteries. Recommend continued wound care and possible debridement. Patient would like to avoid amputation if at all possible. Signed, Criselda Peaches, MD [facility] Electronically Signed   By: Jacqulynn Cadet M.D.   On: 02/10/2017 19:26    Scheduled Meds: . alprazolam  1 mg Oral TID  . amiodarone  200 mg Oral Daily  . atorvastatin  20 mg Oral q1800  . docusate sodium  100 mg Oral BID  . fenofibrate  160 mg Oral Daily  . ferrous sulfate  325 mg Oral Q breakfast  . gabapentin  300 mg Oral BID  . insulin aspart  0-20 Units Subcutaneous TID WC  . insulin aspart  0-5 Units Subcutaneous QHS  . insulin aspart  4 Units Subcutaneous TID WC  . insulin glargine  45 Units Subcutaneous Daily  . levothyroxine  25 mcg Oral QAC breakfast  . metoprolol succinate  25 mg Oral Daily  . omega-3 acid ethyl esters  1 g Oral Daily  . pantoprazole  40 mg Oral BID  . torsemide  10 mg Oral Daily   Continuous Infusions: . ceFEPime (MAXIPIME) IV Stopped (02/12/17 1312)  . vancomycin Stopped (02/11/17 1905)     LOS: 5 days    Time spent: Total of 15 minutes spent with pt, greater than 50% of which was spent in  discussion of  treatment, counseling and coordination of care    Chipper Oman, MD Pager: Text Page via [email or web address]  [phone]  If 7PM-7AM, please contact night-coverage [email or web address] Password [email or web address] 02/12/2017, 1:35 PM

## 2017-02-13 ENCOUNTER — Other Ambulatory Visit: Payer: Self-pay | Admitting: *Deleted

## 2017-02-13 DIAGNOSIS — E1151 Type 2 diabetes mellitus with diabetic peripheral angiopathy without gangrene: Secondary | ICD-10-CM

## 2017-02-13 DIAGNOSIS — M00872 Arthritis due to other bacteria, left ankle and foot: Secondary | ICD-10-CM

## 2017-02-13 DIAGNOSIS — Z8379 Family history of other diseases of the digestive system: Secondary | ICD-10-CM

## 2017-02-13 DIAGNOSIS — Z79899 Other long term (current) drug therapy: Secondary | ICD-10-CM

## 2017-02-13 DIAGNOSIS — I129 Hypertensive chronic kidney disease with stage 1 through stage 4 chronic kidney disease, or unspecified chronic kidney disease: Secondary | ICD-10-CM

## 2017-02-13 DIAGNOSIS — E1140 Type 2 diabetes mellitus with diabetic neuropathy, unspecified: Secondary | ICD-10-CM

## 2017-02-13 DIAGNOSIS — Z87891 Personal history of nicotine dependence: Secondary | ICD-10-CM

## 2017-02-13 DIAGNOSIS — Z8249 Family history of ischemic heart disease and other diseases of the circulatory system: Secondary | ICD-10-CM

## 2017-02-13 DIAGNOSIS — E1122 Type 2 diabetes mellitus with diabetic chronic kidney disease: Secondary | ICD-10-CM

## 2017-02-13 DIAGNOSIS — Z833 Family history of diabetes mellitus: Secondary | ICD-10-CM

## 2017-02-13 DIAGNOSIS — B9689 Other specified bacterial agents as the cause of diseases classified elsewhere: Secondary | ICD-10-CM

## 2017-02-13 DIAGNOSIS — Z7901 Long term (current) use of anticoagulants: Secondary | ICD-10-CM

## 2017-02-13 DIAGNOSIS — L03116 Cellulitis of left lower limb: Secondary | ICD-10-CM

## 2017-02-13 DIAGNOSIS — M86172 Other acute osteomyelitis, left ankle and foot: Secondary | ICD-10-CM

## 2017-02-13 LAB — GLUCOSE, CAPILLARY
GLUCOSE-CAPILLARY: 307 mg/dL — AB (ref 65–99)
Glucose-Capillary: 339 mg/dL — ABNORMAL HIGH (ref 65–99)
Glucose-Capillary: 299 mg/dL — ABNORMAL HIGH (ref 65–99)
Glucose-Capillary: 304 mg/dL — ABNORMAL HIGH (ref 65–99)

## 2017-02-13 LAB — POCT ACTIVATED CLOTTING TIME: ACTIVATED CLOTTING TIME: 158 s

## 2017-02-13 MED ORDER — INSULIN GLARGINE 100 UNIT/ML ~~LOC~~ SOLN
30.0000 [IU] | Freq: Two times a day (BID) | SUBCUTANEOUS | Status: DC
  Administered 2017-02-13 – 2017-02-14 (×2): 30 [IU] via SUBCUTANEOUS
  Filled 2017-02-13 (×3): qty 0.3

## 2017-02-13 MED ORDER — SODIUM CHLORIDE 0.9 % IV SOLN
1.0000 g | INTRAVENOUS | Status: DC
  Administered 2017-02-13: 1 g via INTRAVENOUS
  Administered 2017-02-14: 0.02 g via INTRAVENOUS
  Filled 2017-02-13 (×2): qty 1

## 2017-02-13 MED ORDER — INSULIN ASPART 100 UNIT/ML ~~LOC~~ SOLN
5.0000 [IU] | Freq: Three times a day (TID) | SUBCUTANEOUS | Status: DC
  Administered 2017-02-13 – 2017-02-14 (×3): 5 [IU] via SUBCUTANEOUS

## 2017-02-13 MED ORDER — APIXABAN 5 MG PO TABS
5.0000 mg | ORAL_TABLET | Freq: Two times a day (BID) | ORAL | Status: DC
  Administered 2017-02-13 – 2017-02-14 (×2): 5 mg via ORAL
  Filled 2017-02-13 (×3): qty 1

## 2017-02-13 NOTE — Consult Note (Signed)
[name] for Infectious Disease    Date of Admission:  02/07/2017    Total days of antibiotics-7         Cefepime Day 7         Vancomycin Day 7             Reason for Consult: Osteomyelitis and septic arthritis of left big toe    Referring Provider: Chipper Oman, MD Primary Care Provider: Redmond School, MD     Assessment: According to patient his foot is improving, there is any improvement in his erythema and edema. He is able to walk without pain. He was asking to be discharged. Patient had MRI positive destruction of bone along with some abscess formation more consistent with diabetic foot ulcer complicated with osteomyelitis. There was no culture data to help narrowing his antibiotics. Patient is already on broad-spectrum antibiotics for the past one week-getting a culture will be most likely fruitless at this time. As complicated diabetic foot infections are multi-microbial including anaerobes, gram-negative rods, strep and staph. Chances of MRSA are low because of the negative PCR and not much exposure to antibiotics recently. We will recommend simpler IV antibiotic regimen, as he will need IV antibiotics for 6 weeks. Discussed with patient and he agreed with the plan. He will need to follow-up with ID outpatient clinic in 1 month.   Plan: 1. Discontinue vancomycin and cefepime. 2. Start him on Ertapenem 1 g IV daily for 6 weeks. 3. Place a PICC line. 4. Case manager consult to arrange for advanced home care for IV antibiotics.  Diagnosis: Complicated diabetic foot infection with osteomyelitis.   Culture Result: Not available  No Known Allergies  OPAT Orders Discharge antibiotics: Ertapenem (INVANZ) 1 g in NS 50 ml IVPB.  Duration: 6 weeks .   END Date. 03/28/17.  PIC Care Per Protocol:  Labs weekly while on IV antibiotics: _x_ CBC with differential _x_ BMP __ CMP _x_ CRP _x_ ESR   _ Please pull PIC at completion of IV antibiotics x_  Please leave PIC in place until doctor has seen patient or been notified  Fax weekly labs to [phone]  Clinic Follow Up Appt: In one month.      Principal Problem:   Osteomyelitis due to type 2 diabetes mellitus ([name]) Active Problems:   S/P CABG x 3 - 2010   S/P AVR-tissue, 2010   Dyslipidemia   Obesity (BMI 30-39.9)   Peripheral arterial occlusive disease (HCC)   PVD (peripheral vascular disease) (HCC)   A-fib (HCC)   CKD (chronic kidney disease), stage III   Diabetic foot infection ([name])   Type 2 diabetes mellitus with left diabetic foot infection ([name])   Uncontrolled type 2 diabetes mellitus with hyperglycemia, with long-term current use of insulin ([name])   . alprazolam  1 mg Oral TID  . amiodarone  200 mg Oral Daily  . atorvastatin  20 mg Oral q1800  . docusate sodium  100 mg Oral BID  . fenofibrate  160 mg Oral Daily  . ferrous sulfate  325 mg Oral Q breakfast  . gabapentin  300 mg Oral BID  . insulin aspart  0-20 Units Subcutaneous TID WC  . insulin aspart  0-5 Units Subcutaneous QHS  . insulin aspart  5 Units Subcutaneous TID WC  . insulin glargine  30 Units Subcutaneous BID  . levothyroxine  25 mcg Oral QAC breakfast  . metoprolol succinate  25 mg Oral  Daily  . omega-3 acid ethyl esters  1 g Oral Daily  . pantoprazole  40 mg Oral BID  . torsemide  10 mg Oral Daily    HPI: [name] is a 71 y.o. male with PMHx significant for DM Type 2, HTN, PVD,CKD stage 3,bioprosthetic aortic valve 2010, and paroxysmal atrial fibrillation on apixaban was admitted to [facility] on 02/07/2017 with complaint of worsening pain, erythema and edema of his left foot. According to patient he is having problem with his left foot for last couple of month, according to chart review his left foot was worsened in the past 2 weeks before admission. Patient had an history of chronic left foot ulcer at the base of first metatarsal, admitted for worsening edema and pain on  12/12/2016 till 12/14/2016, diagnosed with left foot cellulitis as MRI done during that visit was negative for any osteomyelitis, initially treated with IV ceftriaxone and clindamycin, discharge on doxycycline for 10 days.Patient did completed that course. Later he saw his PCP on 01/24/2017 with worsening erythema and swelling, he was given Augmentin which he took it for 5-7 days, he was having some difficulty with  pill size and was cutting in half. He went back to his PCP on 02/07/2017 with worsening of his symptoms, he was advised to come to [facility] for further evaluation and management. MRI done on 02/07/2017 shows extensive bone destruction of the distal half of first metatarsal and the base toe first proximal phalanx around the first MTP joint most consistent with septic arthritis and osteomyelitis along with 7.3x 20 mm abscess along the medial aspect of the first MTP joint. Patient was admitted for IV antibiotics and was placed on cefepime and vancomycin since then. Blood culture remained negative, there was no attempt to obtain bone culture. Patient did underwent angiographically with endovascular optimalization of blood flow by IR during current hospitalization. IR will arrange for an other procedure for reconstruction of occluded tibial arteries.   Patient would like to avoid amputation if possible .  Orthopedic was consulted- Dr Sharol Given advised outpatient follow-up as his symptoms are improving and in order to avoid amputation if possible.  Patient's symptoms are improving since his admission.patient remained afebrile and there was no leukocytosis since admission.C-reactive protein and sedimentation rate were elevated .   Review of Systems: ROS  Past Medical History:  Diagnosis Date  . At risk for sudden cardiac death 2013/06/28  . Cataract   . Chronic anticoagulation 05/2013   started  . Chronic kidney disease    Patient reports he is seeing Kidney doctor in September  . Coronary  artery disease   . Diabetes mellitus    type 2   . Dyslipidemia   . GERD (gastroesophageal reflux disease)   . History of valve replacement 2010   [facility] Ease bioprosthetic 24m  . Hypertension   . PAF (paroxysmal atrial fibrillation) ([facility] 05/2013   converted with amiodarone to SR  . S/P CABG x 3 2010  . S/P cardiac catheterization, Rt & Lt heart cath 06/05/2013 1Nov 14, 2014 . Stroke ([facility] 2003   R-sided weakness & upper extremity swelling   . Thyroid disease     Social History  Substance Use Topics  . Smoking status: Former Smoker    Types: Cigarettes    Quit date: 08/15/2001  . Smokeless tobacco: Former [employer]   Types: Chew     Comment: Quit in 2003  . Alcohol use No    Family History  Problem Relation Age of Onset  . Heart attack Mother   . Liver disease Brother   . Diabetes Sister        x4   No Known Allergies  OBJECTIVE: Blood pressure 137/69, pulse 66, temperature 97.8 F (36.6 C), temperature source Oral, resp. rate 18, height '5\' 10"'  (1.778 m), weight 238 lb 9.6 oz (108.2 kg), SpO2 97 %.  Physical Exam   Vitals:   02/12/17 1313 02/12/17 2233 02/13/17 0444 02/13/17 1251  BP: (!) 137/53 (!) 148/57 (!) 142/50 137/69  Pulse: 65 65 61 66  Resp: '19 19 18 18  ' Temp: 98.2 F (36.8 C) 97.7 F (36.5 C) 97.6 F (36.4 C) 97.8 F (36.6 C)  TempSrc: Oral Oral Oral Oral  SpO2: 100% 100% 95% 97%  Weight:      Height:       General: Vital signs reviewed.  Patient is well-developed and well-nourished, in no acute distress and cooperative with exam.  Cardiovascular: RRR, S1 normal, S2 normal, no murmurs, gallops, or rubs. Pulmonary/Chest: Clear to auscultation bilaterally, no wheezes, rales, or rhonchi. Abdominal: Soft, non-tender, non-distended, BS +, no masses, organomegaly, or guarding present.  Extremities: Left foot edema and erythema involving first second and third metatarsal along with some proximal dorsal surface.well healed ulcer at the base of first  metatarsal, there was some fluctuation at lateral metatarsal head, no discharge.  Lab Results Lab Results  Component Value Date   WBC 6.7 02/11/2017   HGB 11.4 (L) 02/11/2017   HCT 36.1 (L) 02/11/2017   MCV 87.4 02/11/2017   PLT 190 02/11/2017    Lab Results  Component Value Date   CREATININE 1.21 02/11/2017   BUN 17 02/11/2017   NA 135 02/11/2017   K 4.4 02/11/2017   CL 102 02/11/2017   CO2 26 02/11/2017    Lab Results  Component Value Date   ALT 29 12/12/2016   AST 29 12/12/2016   ALKPHOS 48 12/12/2016   BILITOT 0.4 12/12/2016     Microbiology: Recent Results (from the past 240 hour(s))  Culture, blood (Routine X 2) w Reflex to ID Panel     Status: None   Collection Time: 02/07/17  3:40 PM  Result Value Ref Range Status   Specimen Description   Final    RIGHT ANTECUBITAL BOTTLES DRAWN AEROBIC AND ANAEROBIC   Special Requests Blood Culture adequate volume  Final   Culture NO GROWTH 5 DAYS  Final   Report Status 02/12/2017 FINAL  Final  Culture, blood (Routine X 2) w Reflex to ID Panel     Status: None   Collection Time: 02/07/17  3:40 PM  Result Value Ref Range Status   Specimen Description   Final    RIGHT ANTECUBITAL BOTTLES DRAWN AEROBIC AND ANAEROBIC   Special Requests   Final    Blood Culture results may not be optimal due to an inadequate volume of blood received in culture bottles   Culture NO GROWTH 5 DAYS  Final   Report Status 02/12/2017 FINAL  Final  MRSA PCR Screening     Status: None   Collection Time: 02/09/17  5:09 PM  Result Value Ref Range Status   MRSA by PCR NEGATIVE NEGATIVE Final    Comment:        The GeneXpert MRSA Assay (FDA approved for NASAL specimens only), is one component of a comprehensive MRSA colonization surveillance program. It is not intended to diagnose MRSA infection nor to guide or monitor treatment for  MRSA infections.     Lorella Nimrod, MD PGY2 [facility] for Infectious [place] [phone] pager   [phone] cell 02/13/2017, 1:58 PM

## 2017-02-13 NOTE — Progress Notes (Signed)
[facility]  New pt for [facility] this hospital admission   [facility] will provide Home Infusion Pharmacy services for IV ABX upon DC.   [facility] will partner with patients [place] of choice for [facility] services.  If patient discharges after hours, please call [phone].   Larry Sierras 02/13/2017, 3:49 PM

## 2017-02-13 NOTE — Progress Notes (Signed)
PROGRESS NOTE [facility]   [name]   TKZ:[id number] DOB: 1946/04/06  DOA: 02/07/2017 PCP: Redmond School, MD   Brief Narrative:  72 year old man admitted to the hospital on 6/26 from his PCPs office due to worsening edema and erythema of his left foot. MRI shows evidence for osteomyelitis, septic arthritis and an abscess along the aspect of the first MTP joint and admission has been requested. Seen by general surgery today who is recommended transfer to [facility] for evaluation by vascular surgery for potential revascularization and amputation. IR has evaluated patient and schedule for revascularization tonight.   Subjective: Significantly better today, orthopedic surgeon recommending no surgery at this moment, trying to save his toe with revascularization.   Assessment & Plan:   Left foot cellulitis with great toe osteomyelitis -ID has been consulted for antibiotic indication. Surgical team has indicated antibiotic management for now as no surgery will be performed in order to try to save. His to likely to get a PICC line and long-term IV antibiotics. Defer to ID for abx recommendations will continue cefepime and vac for now  -MRI shows severe extensive bone destruction of the distal half of the first metatarsal and base of the first proximal phalanx centered around the first MTP joint most consistent with septic arthritis and osteomyelitis as well as an abscess along the medial aspect of the first MTP joint. -Patient was admitted for cellulitis in the same area with no signs of bone infection back in April 2018 -Patient s/p angiogram, with good improve blood flow to foot. IR planning to perform another intervention as outpatient  -Orthopedic surgery no plans for amputation at this time  -Can resume anticoagulation    Peripheral vascular disease -04/23/15--right lower extremity arteriogram with recanalization of the right SFA occlusion and angioplasty/stenting of  the right SFA and popliteal artery -10/07/15--right atherectomy and drug-eluting balloon angioplasty of the distal SFA/popliteal stenosis as well as atherectomy and angioplasty of a peroneal artery stenosis -12/12/16--ABIwere 0.41 on the right and 0.62 on the left, previously 0.66 and 0.61 respectively -02/10/17 s/p LLE angiogram and orbital atherectomy w DCB-PTA of multiple severe stenoses throughout the SFA and pop arteries.   Paroxysmal A. Fib - remains stable  -Currently in sinus rhythm and rate controlled -Continue amiodarone and metoprolol, -Resume Eliquis   Type 2 diabetes  -CBGs remain uncontrolled. -Lantus adjusted to 30 units BID - Novolog increase to 5 units TID with meals  -Continue SSI and monitor CBG's   Hyperlipidemia  -continue Lovaza  Hypothyroidism -Continue Synthroid  Stage III chronic kidney disease -Baseline creatinine between 1.2 and 1.5 -Cr remains at baseline   Chronic diastolic CHF -Echo in March 2018 with an ejection fraction of 55% and grade 1 diastolic dysfunction. -Is clinically euvolemic  DVT prophylaxis: SCD's  Code Status: FULL  Family Communication: None at bedside  Disposition Plan: Home likely in 24 hrs   Consultants:   IR   Orthopedic surgery   Procedures:   None   Antimicrobials: Anti-infectives    Start     Dose/Rate Route Frequency Ordered Stop   02/09/17 2200  ceFEPIme (MAXIPIME) 2 g in dextrose 5 % 50 mL IVPB     2 g 100 mL/hr over 30 Minutes Intravenous Every 12 hours 02/09/17 0930     02/08/17 1000  ceFEPIme (MAXIPIME) 2 g in dextrose 5 % 50 mL IVPB  Status:  Discontinued     2 g 100 mL/hr over 30 Minutes Intravenous Every 24 hours 02/07/17  1644 02/09/17 0930   02/08/17 0600  vancomycin (VANCOCIN) IVPB 1000 mg/200 mL premix     1,000 mg 200 mL/hr over 60 Minutes Intravenous Every 12 hours 02/07/17 1643     02/07/17 1445  vancomycin (VANCOCIN) 1,500 mg in sodium chloride 0.9 % 500 mL IVPB     1,500 mg 250 mL/hr  over 120 Minutes Intravenous  Once 02/07/17 1433 02/08/17 0009   02/07/17 1445  ceFEPIme (MAXIPIME) 2 g in dextrose 5 % 50 mL IVPB     2 g 100 mL/hr over 30 Minutes Intravenous  Once 02/07/17 1433 02/07/17 2200         Objective: Vitals:   02/12/17 1313 02/12/17 2233 02/13/17 0444 02/13/17 1251  BP: (!) 137/53 (!) 148/57 (!) 142/50 137/69  Pulse: 65 65 61 66  Resp: 19 19 18 18   Temp: 98.2 F (36.8 C) 97.7 F (36.5 C) 97.6 F (36.4 C) 97.8 F (36.6 C)  TempSrc: Oral Oral Oral Oral  SpO2: 100% 100% 95% 97%  Weight:      Height:        Intake/Output Summary (Last 24 hours) at 02/13/17 1351 Last data filed at 02/13/17 1252  Gross per 24 hour  Intake              530 ml  Output             1150 ml  Net             -620 ml   Filed Weights   02/08/17 0500 02/09/17 0440 02/11/17 1244  Weight: 106.6 kg (235 lb) 108 kg (238 lb 1.6 oz) 108.2 kg (238 lb 9.6 oz)    Examination:  General: Pt is alert, awake, not in acute distress Cardiovascular: RRR, S1/S2 +, no rubs, no gallops Respiratory: CTA bilaterally, no wheezing, no rhonchi Abdominal: Soft, NT, ND, bowel sounds + Extremities: no edema, no cyanosis Skin: see picture below      Admission     02/11/17     02/13/17    Data Reviewed: I have personally reviewed following labs and imaging studies  CBC:  Recent Labs Lab 02/07/17 1540 02/08/17 0552 02/09/17 0541 02/10/17 1111 02/11/17 0421  WBC 7.4 7.0 6.4 6.5 6.7  NEUTROABS  --   --   --   --  4.0  HGB 11.9* 11.2* 11.1* 12.6* 11.4*  HCT 36.7* 35.4* 34.2* 39.8 36.1*  MCV 88.0 88.5 86.6 87.5 87.4  PLT 252 234 209 243 329   Basic Metabolic Panel:  Recent Labs Lab 02/07/17 1540 02/08/17 0552 02/09/17 0541 02/10/17 1111 02/11/17 0421  NA 136 137 135 137 135  K 4.1 4.3 4.2 4.7 4.4  CL 100* 102 100* 102 102  CO2 26 27 28 26 26   GLUCOSE 220* 218* 230* 198* 294*  BUN 24* 21* 20 17 17   CREATININE 1.58* 1.34* 1.37* 1.23 1.21  CALCIUM 9.1 8.7* 8.8* 9.6 8.7*    GFR: Estimated Creatinine Clearance: 72 mL/min (by C-G formula based on SCr of 1.21 mg/dL). Liver Function Tests: No results for input(s): AST, ALT, ALKPHOS, BILITOT, PROT, ALBUMIN in the last 168 hours. No results for input(s): LIPASE, AMYLASE in the last 168 hours. No results for input(s): AMMONIA in the last 168 hours. Coagulation Profile:  Recent Labs Lab 02/10/17 0413  INR 1.08   Cardiac Enzymes: No results for input(s): CKTOTAL, CKMB, CKMBINDEX, TROPONINI in the last 168 hours. BNP (last 3 results) No results for input(s): PROBNP in the  last 8760 hours. HbA1C: No results for input(s): HGBA1C in the last 72 hours. CBG:  Recent Labs Lab 02/12/17 1152 02/12/17 1700 02/12/17 2230 02/13/17 0750 02/13/17 1154  GLUCAP 325* 272* 343* 307* 339*   Lipid Profile: No results for input(s): CHOL, HDL, LDLCALC, TRIG, CHOLHDL, LDLDIRECT in the last 72 hours. Thyroid Function Tests: No results for input(s): TSH, T4TOTAL, FREET4, T3FREE, THYROIDAB in the last 72 hours. Anemia Panel: No results for input(s): VITAMINB12, FOLATE, FERRITIN, TIBC, IRON, RETICCTPCT in the last 72 hours. Sepsis Labs: No results for input(s): PROCALCITON, LATICACIDVEN in the last 168 hours.  Recent Results (from the past 240 hour(s))  Culture, blood (Routine X 2) w Reflex to ID Panel     Status: None   Collection Time: 02/07/17  3:40 PM  Result Value Ref Range Status   Specimen Description   Final    RIGHT ANTECUBITAL BOTTLES DRAWN AEROBIC AND ANAEROBIC   Special Requests Blood Culture adequate volume  Final   Culture NO GROWTH 5 DAYS  Final   Report Status 02/12/2017 FINAL  Final  Culture, blood (Routine X 2) w Reflex to ID Panel     Status: None   Collection Time: 02/07/17  3:40 PM  Result Value Ref Range Status   Specimen Description   Final    RIGHT ANTECUBITAL BOTTLES DRAWN AEROBIC AND ANAEROBIC   Special Requests   Final    Blood Culture results may not be optimal due to an inadequate  volume of blood received in culture bottles   Culture NO GROWTH 5 DAYS  Final   Report Status 02/12/2017 FINAL  Final  MRSA PCR Screening     Status: None   Collection Time: 02/09/17  5:09 PM  Result Value Ref Range Status   MRSA by PCR NEGATIVE NEGATIVE Final    Comment:        The GeneXpert MRSA Assay (FDA approved for NASAL specimens only), is one component of a comprehensive MRSA colonization surveillance program. It is not intended to diagnose MRSA infection nor to guide or monitor treatment for MRSA infections.      Radiology Studies: No results found.  Scheduled Meds: . alprazolam  1 mg Oral TID  . amiodarone  200 mg Oral Daily  . atorvastatin  20 mg Oral q1800  . docusate sodium  100 mg Oral BID  . fenofibrate  160 mg Oral Daily  . ferrous sulfate  325 mg Oral Q breakfast  . gabapentin  300 mg Oral BID  . insulin aspart  0-20 Units Subcutaneous TID WC  . insulin aspart  0-5 Units Subcutaneous QHS  . insulin aspart  5 Units Subcutaneous TID WC  . insulin glargine  30 Units Subcutaneous BID  . levothyroxine  25 mcg Oral QAC breakfast  . metoprolol succinate  25 mg Oral Daily  . omega-3 acid ethyl esters  1 g Oral Daily  . pantoprazole  40 mg Oral BID  . torsemide  10 mg Oral Daily   Continuous Infusions: . ceFEPime (MAXIPIME) IV Stopped (02/13/17 1001)  . vancomycin Stopped (02/13/17 0610)     LOS: 6 days    Time spent: Total of 15 minutes spent with pt, greater than 50% of which was spent in discussion of  treatment, counseling and coordination of care    Chipper Oman, MD Pager: Text Page via [email or web address]  [phone]  If 7PM-7AM, please contact night-coverage [email or web address] Password [email or web address] 02/13/2017, 1:51 PM

## 2017-02-13 NOTE — Progress Notes (Addendum)
Inpatient Diabetes Program Recommendations  AACE/ADA: New Consensus Statement on Inpatient Glycemic Control (2015)  Target Ranges:  Prepandial:   less than 140 mg/dL      Peak postprandial:   less than 180 mg/dL (1-2 hours)      Critically ill patients:  140 - 180 mg/dL   Results for KHI, MCMILLEN (MRN [id number]) as of 02/13/2017 10:17  Ref. Range 02/12/2017 07:37 02/12/2017 11:52 02/12/2017 17:00 02/12/2017 22:30 02/13/2017 07:50  Glucose-Capillary Latest Ref Range: 65 - 99 mg/dL 306 (H) 325 (H) 272 (H) 343 (H) 307 (H)   Review of Glycemic Control  Diabetes history: DM 2 Outpatient Diabetes medications: Lantus 75 in am and 60 units QHS, Humalog 35-50-50 units tidwc and Invokana 100 mg QD. Current orders for Inpatient glycemic control: Lantus 45 units Daily, Novolog 4 units tid meal coverage, Novolog Resistant  Tid + Novolog HS scale  Inpatient Diabetes Program Recommendations:  Glucose in the 200-300 range. Patient takes Lantus BID at home and also takes more meal coverage. Please consider increasing Lantus to 30-32 units BID dosing (total of half of home dose).  Consider increasing Novolog meal coverage to 10 units tid.  A1c 6.9% on 02/07/17  Thanks,  Tama Headings RN, MSN, [facility] Inpatient Diabetes Coordinator Team Pager [phone] (8a-5p)

## 2017-02-13 NOTE — Progress Notes (Signed)
Referring Physician(s): Dr Patrecia Pour  Supervising Physician: Arne Cleveland  Patient Status:  [facility] - In-pt  Chief Complaint:  Left foot wound cellulitis/PAD  Subjective:   6/29: Left foot cellulitis with PAD, s/p Orbital atherectomy and DCB-PTA of multifocal severe stenoses throughout the SFA and popliteal arteries----Dr Shellia Cleverly  Pt states he feels foot is lots better  Less pain Less red Moving well  Dr Sharol Given Note today: Patient is status post endovascular procedure for infection left great toe. Examination the cellulitis in  the patient's forefoot has completely resolved. The fluctuance and cellulitis around the great toe is also resolved. There is no swelling no signs of infection. I will hold on any type of amputation surgery at this time. I will follow-up in the office as an outpatient.  Allergies: Patient has no known allergies.  Medications: Prior to Admission medications   Medication Sig Start Date End Date Taking? Authorizing Provider  acetaminophen (TYLENOL) 500 MG tablet Take 1,000 mg by mouth 2 (two) times daily as needed for headache. For pain/headaches   Yes [provider]  alprazolam Duanne Moron) 2 MG tablet Take 1 mg by mouth 3 (three) times daily.    Yes [provider]  amiodarone (PACERONE) 200 MG tablet Take 1 tablet (200 mg total) by mouth daily. 10/14/16  Yes Hilty, Nadean Corwin, MD  apixaban (ELIQUIS) 5 MG TABS tablet Take 1 tablet (5 mg total) by mouth 2 (two) times daily. 10/14/16  Yes Hilty, Nadean Corwin, MD  atorvastatin (LIPITOR) 20 MG tablet Take 1 tablet (20 mg total) by mouth daily at 6 PM. 06/18/16  Yes Rehman, Mechele Dawley, MD  fenofibrate (TRICOR) 145 MG tablet TAKE ONE TABLET EACH DAY. 11/01/16  Yes Hilty, Nadean Corwin, MD  ferrous sulfate 325 (65 FE) MG EC tablet Take 325 mg by mouth daily with breakfast.   Yes [provider]  gabapentin (NEURONTIN) 300 MG capsule Take 300 mg by mouth 2 (two) times daily.    Yes [provider]  insulin glargine (LANTUS) 100 UNIT/ML injection Inject 0.6-0.75 mLs (60-75 Units total) into the skin 2 (two) times daily. 75 units in the morning then 60 units at bedtime 02/25/16  Yes Hongalgi, Lenis Dickinson, MD  insulin lispro (HUMALOG) 100 UNIT/ML injection Inject 0.35-0.5 mLs (35-50 Units total) into the skin 3 (three) times daily before meals. Patient states that he takes 35 units at breakfast, 50 units at dinner and 50 units at supper. Patient taking differently: Inject 35-45 Units into the skin 3 (three) times daily before meals. Patient states that he takes 35 units at breakfast, 50 units at dinner and 50 units at supper. 02/25/16  Yes Hongalgi, Lenis Dickinson, MD  INVOKANA 100 MG TABS tablet Take 1 tablet by mouth daily. 12/09/16  Yes [provider]  levothyroxine (SYNTHROID, LEVOTHROID) 25 MCG tablet Take 25 mcg by mouth daily before breakfast.   Yes [provider]  meclizine (ANTIVERT) 25 MG tablet Take 25 mg by mouth 3 (three) times daily as needed for dizziness.   Yes [provider]  metoprolol succinate (TOPROL-XL) 25 MG 24 hr tablet Take 1 tablet (25 mg total) by mouth daily. 10/14/16  Yes Hilty, Nadean Corwin, MD  Omega-3 Fatty Acids (FISH OIL) 1200 MG CAPS Take 1 capsule by mouth daily.    Yes [provider]  pantoprazole (PROTONIX) 40 MG tablet Take 1 tablet (40 mg total) by mouth 2 (two) times daily. 10/30/14  Yes Hilty, Nadean Corwin, MD  silver sulfADIAZINE (SILVADENE) 1 % cream Apply 1 application topically daily.   Yes [provider]  sulfamethoxazole-trimethoprim (BACTRIM DS,SEPTRA DS) 800-160 MG tablet Take 1 tablet by mouth 2 (two) times daily.   Yes [provider]  torsemide (DEMADEX) 10 MG tablet Take 1 tablet (10 mg total) by mouth daily. 10/14/16  Yes Hilty, Nadean Corwin, MD  amoxicillin-clavulanate (AUGMENTIN) 875-125 MG tablet Take 1 tablet by mouth 2 (two) times daily.  01/24/17   [provider]     Vital  Signs: BP (!) 142/50 (BP Location: Left Arm)   Pulse 61   Temp 97.6 F (36.4 C) (Oral)   Resp 18   Ht 5\' 10"  (1.778 m)   Wt 238 lb 9.6 oz (108.2 kg)   SpO2 95%   BMI 34.24 kg/m   Physical Exam  Constitutional: He is oriented to person, place, and time.  Musculoskeletal: Normal range of motion. He exhibits edema and tenderness.  Minimal redness of left foot  Minimal edema Much better than just few days ago  Still with sore on skin at plantar 1st toe   Neurological: He is alert and oriented to person, place, and time.  Skin: Skin is warm and dry.  Psychiatric: He has a normal mood and affect.  Nursing note and vitals reviewed.   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 He presents for arteriogram and endovascular optimization of blood flow to the wound. EXAM: LEFT EXTREMITY ARTERIOGRAPHY; IR ULTRASOUND GUIDANCE VASC ACCESS RIGHT; IR FEM POP ART ATHERECT INC PTA MEDICATIONS: Patient is currently an inpatient receiving intravenous antibiotics. No additional antibiotics were administered. 8000 units heparin and 1100 mcg nitroglycerin were administered. ANESTHESIA/SEDATION: Moderate (conscious) sedation was employed during this procedure. A total of Versed 3.5 mg and Fentanyl 175 mcg was administered intravenously. Moderate Sedation Time: 125 minutes. The patient's level of consciousness and vital signs were monitored continuously by radiology nursing throughout the procedure under my direct supervision. CONTRAST:  75mL VISIPAQUE IODIXANOL 320 MG/ML IV SOLN, 74mL VISIPAQUE IODIXANOL 320 MG/ML IV SOLN FLUOROSCOPY TIME:  Fluoroscopy Time: 26 minutes 36 seconds (219 mGy). COMPLICATIONS: None immediate. PROCEDURE: Informed consent was obtained from the patient following explanation of the procedure, risks, benefits and alternatives. The patient understands, agrees and  consents for the procedure. All questions were addressed. A time out was performed prior to the initiation of the procedure. Maximal barrier sterile technique utilized including caps, mask, sterile gowns, sterile gloves, large sterile drape, hand hygiene, and Betadine prep. The right common femoral artery was interrogated with ultrasound and found to be widely patent. An image was obtained and stored for the medical record. Local anesthesia was attained by infiltration with 1% lidocaine. A small dermatotomy was made. Under real-time sonographic guidance, the vessel was punctured with a 21 gauge micropuncture needle. Using standard technique, the initial micro needle was exchanged over a 0.018 micro wire for a transitional 4 [place] micro sheath. The micro sheath was then exchanged over a 0.035 wire for a 5 French vascular sheath. An Omni flush catheter was advanced to the aortic bifurcation and pelvic arteriography was performed. Both internal iliac arteries are widely patent. The common and external iliac arteries are widely patent. The Omni flush catheter was then advanced up in over the aortic bifurcation and into the left common femoral artery. A multi station left lower extremity arteriogram was then performed. There is a multifocal calcified  atherosclerotic plaque throughout the superficial femoral artery. There are multiple regions of a moderate and high-grade focal stenosis. The most critical stenosis is in the region of of the mid thigh were the vessel is approximately 90% stenosis. The distal most superficial femoral artery in the P1 segment of the popliteal artery are relatively spared from disease. However, there is a focal high-grade stenosis in the P2 segment of the popliteal artery just above the joint space. Significant runoff disease. Both the anterior tibial and posterior tibial arteries are occluded. The peroneal artery provides the dominant runoff to the foot and reconstitutes both the distal  posterior tibial artery and the dorsalis pedis artery. The plantar loop is intact. A CX I catheter was advanced over a Bentson wire into the distal popliteal artery. The Bentson wire was then exchanged for a by per wire. Multifocal orbital atherectomy was performed throughout the superficial femoral and popliteal artery easy using a 2.0 classic crown for the CS I Diamond back device. Following atherectomy, multi station drug-eluting balloon angioplasty was performed. The P2 segment popliteal artery was treated using a 5 x 120 IN.PACT balloon. The remaining superficial femoral artery was treated with minimally overlapping stations using two 6 x 150 IN.PACT balloons. There was a focal stenosis in the mid superficial femoral artery at the site of the most severe stenosis which could not be completely opened using the IN.PACT balloon. This remained present with a moderate to high-grade residual focal stenosis on follow-up angiography. There are multifocal mild non flow limiting linear dissections. The remaining residual stenosis was then treated using a 5 x 40 mm are mottled balloon. The are mild balloon was successfully expanded to full effacement with rupture of the plaque. The balloon was left inflated for a prolonged period of time. Follow-up arteriography demonstrates improved luminal gain. There is no residual significant stenosis. Lateral foot arteriography was again performed demonstrating no evidence of distal embolic phenomenon. The wires and catheter were removed. Hemostasis was attained with the assistance of a Cordis Exoseal extra arterial vascular plug. The patient tolerated the procedure well. IMPRESSION: 1. Left lower extremity diagnostic arteriography demonstrates a diffusely diseased superficial femoral artery with multiple areas of moderate and high-grade focal stenosis secondary to heavily calcified atherosclerotic plaque. Additionally, there is a focal high-grade stenosis in the P2 segment of the  popliteal artery. 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 2. Successful orbital atherectomy and drug coated balloon angioplasty of the popliteal artery to 5 mm, in the superficial femoral artery to 6 mm. PLAN: While blood flow is significantly improved, patient would likely benefit from reconstruction of the occluded tibial arteries. We will arrange for a second session arteriogram in the near future to attempt to re- vascularized the posterior tibial, and possibly the anterior tibial arteries. Recommend continued wound care and possible debridement. Patient would like to avoid amputation if at all possible. Signed, Criselda Peaches, MD [facility] Radiology Electronically Signed   By: Jacqulynn Cadet M.D.   On: 02/10/2017 19:26   [facility] Pta Mod Sed  Result Date: 02/10/2017 INDICATION: 71 year old male with Rutherford category 5/6 critical limb ischemia involving the left forefoot. He presents for arteriogram and endovascular optimization of blood flow to the wound. EXAM: LEFT EXTREMITY ARTERIOGRAPHY; IR ULTRASOUND GUIDANCE VASC ACCESS RIGHT; IR FEM POP ART ATHERECT INC PTA MEDICATIONS: Patient is currently an inpatient receiving  intravenous antibiotics. No additional antibiotics were administered. 8000 units heparin and 1100 mcg nitroglycerin were administered. ANESTHESIA/SEDATION: Moderate (conscious) sedation was employed during this procedure. A total of Versed 3.5 mg and Fentanyl 175 mcg was administered intravenously. Moderate Sedation Time: 125 minutes. The patient's level of consciousness and vital signs were monitored continuously by radiology nursing throughout the procedure under my direct supervision. CONTRAST:  4mL VISIPAQUE IODIXANOL 320 MG/ML IV SOLN, 32mL VISIPAQUE IODIXANOL 320 MG/ML IV SOLN FLUOROSCOPY  TIME:  Fluoroscopy Time: 26 minutes 36 seconds (219 mGy). COMPLICATIONS: None immediate. PROCEDURE: Informed consent was obtained from the patient following explanation of the procedure, risks, benefits and alternatives. The patient understands, agrees and consents for the procedure. All questions were addressed. A time out was performed prior to the initiation of the procedure. Maximal barrier sterile technique utilized including caps, mask, sterile gowns, sterile gloves, large sterile drape, hand hygiene, and Betadine prep. The right common femoral artery was interrogated with ultrasound and found to be widely patent. An image was obtained and stored for the medical record. Local anesthesia was attained by infiltration with 1% lidocaine. A small dermatotomy was made. Under real-time sonographic guidance, the vessel was punctured with a 21 gauge micropuncture needle. Using standard technique, the initial micro needle was exchanged over a 0.018 micro wire for a transitional 4 [place] micro sheath. The micro sheath was then exchanged over a 0.035 wire for a 5 French vascular sheath. An Omni flush catheter was advanced to the aortic bifurcation and pelvic arteriography was performed. Both internal iliac arteries are widely patent. The common and external iliac arteries are widely patent. The Omni flush catheter was then advanced up in over the aortic bifurcation and into the left common femoral artery. A multi station left lower extremity arteriogram was then performed. There is a multifocal calcified atherosclerotic plaque throughout the superficial femoral artery. There are multiple regions of a moderate and high-grade focal stenosis. The most critical stenosis is in the region of of the mid thigh were the vessel is approximately 90% stenosis. The distal most superficial femoral artery in the P1 segment of the popliteal artery are relatively spared from disease. However, there is a focal high-grade stenosis in the P2  segment of the popliteal artery just above the joint space. Significant runoff disease. Both the anterior tibial and posterior tibial arteries are occluded. The peroneal artery provides the dominant runoff to the foot and reconstitutes both the distal posterior tibial artery and the dorsalis pedis artery. The plantar loop is intact. A CX I catheter was advanced over a Bentson wire into the distal popliteal artery. The Bentson wire was then exchanged for a by per wire. Multifocal orbital atherectomy was performed throughout the superficial femoral and popliteal artery easy using a 2.0 classic crown for the CS I Diamond back device. Following atherectomy, multi station drug-eluting balloon angioplasty was performed. The P2 segment popliteal artery was treated using a 5 x 120 IN.PACT balloon. The remaining superficial femoral artery was treated with minimally overlapping stations using two 6 x 150 IN.PACT balloons. There was a focal stenosis in the mid superficial femoral artery at the site of the most severe stenosis which could not be completely opened using the IN.PACT balloon. This remained present with a moderate to high-grade residual focal stenosis on follow-up angiography. There are multifocal mild non flow limiting linear dissections. The remaining residual stenosis was then treated using a 5 x 40 mm are mottled balloon. The are mild balloon was successfully expanded to  full effacement with rupture of the plaque. The balloon was left inflated for a prolonged period of time. Follow-up arteriography demonstrates improved luminal gain. There is no residual significant stenosis. Lateral foot arteriography was again performed demonstrating no evidence of distal embolic phenomenon. The wires and catheter were removed. Hemostasis was attained with the assistance of a Cordis Exoseal extra arterial vascular plug. The patient tolerated the procedure well. IMPRESSION: 1. Left lower extremity diagnostic arteriography  demonstrates a diffusely diseased superficial femoral artery with multiple areas of moderate and high-grade focal stenosis secondary to heavily calcified atherosclerotic plaque. Additionally, there is a focal high-grade stenosis in the P2 segment of the popliteal artery. 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 2. Successful orbital atherectomy and drug coated balloon angioplasty of the popliteal artery to 5 mm, in the superficial femoral artery to 6 mm. PLAN: While blood flow is significantly improved, patient would likely benefit from reconstruction of the occluded tibial arteries. We will arrange for a second session arteriogram in the near future to attempt to re- vascularized the posterior tibial, and possibly the anterior tibial arteries. Recommend continued wound care and possible debridement. Patient would like to avoid amputation if at all possible. Signed, Criselda Peaches, MD [facility] Radiology Electronically Signed   By: Jacqulynn Cadet M.D.   On: 02/10/2017 19:26   Ir US Guide Vasc Access Right  Result Date: 02/10/2017 INDICATION: 71 year old male with Rutherford category 5/6 critical limb ischemia involving the left forefoot. He presents for arteriogram and endovascular optimization of blood flow to the wound. EXAM: LEFT EXTREMITY ARTERIOGRAPHY; IR ULTRASOUND GUIDANCE VASC ACCESS RIGHT; IR FEM POP ART ATHERECT INC PTA MEDICATIONS: Patient is currently an inpatient receiving intravenous antibiotics. No additional antibiotics were administered. 8000 units heparin and 1100 mcg nitroglycerin were administered. ANESTHESIA/SEDATION: Moderate (conscious) sedation was employed during this procedure. A total of Versed 3.5 mg and Fentanyl 175 mcg was administered intravenously. Moderate Sedation Time: 125 minutes. The patient's level of  consciousness and vital signs were monitored continuously by radiology nursing throughout the procedure under my direct supervision. CONTRAST:  89mL VISIPAQUE IODIXANOL 320 MG/ML IV SOLN, 72mL VISIPAQUE IODIXANOL 320 MG/ML IV SOLN FLUOROSCOPY TIME:  Fluoroscopy Time: 26 minutes 36 seconds (219 mGy). COMPLICATIONS: None immediate. PROCEDURE: Informed consent was obtained from the patient following explanation of the procedure, risks, benefits and alternatives. The patient understands, agrees and consents for the procedure. All questions were addressed. A time out was performed prior to the initiation of the procedure. Maximal barrier sterile technique utilized including caps, mask, sterile gowns, sterile gloves, large sterile drape, hand hygiene, and Betadine prep. The right common femoral artery was interrogated with ultrasound and found to be widely patent. An image was obtained and stored for the medical record. Local anesthesia was attained by infiltration with 1% lidocaine. A small dermatotomy was made. Under real-time sonographic guidance, the vessel was punctured with a 21 gauge micropuncture needle. Using standard technique, the initial micro needle was exchanged over a 0.018 micro wire for a transitional 4 [place] micro sheath. The micro sheath was then exchanged over a 0.035 wire for a 5 French vascular sheath. An Omni flush catheter was advanced to the aortic bifurcation and pelvic arteriography was performed. Both internal iliac arteries are widely patent. The common and external iliac arteries are widely patent. The Omni flush catheter was then advanced up in over the aortic  bifurcation and into the left common femoral artery. A multi station left lower extremity arteriogram was then performed. There is a multifocal calcified atherosclerotic plaque throughout the superficial femoral artery. There are multiple regions of a moderate and high-grade focal stenosis. The most critical stenosis is in the region  of of the mid thigh were the vessel is approximately 90% stenosis. The distal most superficial femoral artery in the P1 segment of the popliteal artery are relatively spared from disease. However, there is a focal high-grade stenosis in the P2 segment of the popliteal artery just above the joint space. Significant runoff disease. Both the anterior tibial and posterior tibial arteries are occluded. The peroneal artery provides the dominant runoff to the foot and reconstitutes both the distal posterior tibial artery and the dorsalis pedis artery. The plantar loop is intact. A CX I catheter was advanced over a Bentson wire into the distal popliteal artery. The Bentson wire was then exchanged for a by per wire. Multifocal orbital atherectomy was performed throughout the superficial femoral and popliteal artery easy using a 2.0 classic crown for the CS I Diamond back device. Following atherectomy, multi station drug-eluting balloon angioplasty was performed. The P2 segment popliteal artery was treated using a 5 x 120 IN.PACT balloon. The remaining superficial femoral artery was treated with minimally overlapping stations using two 6 x 150 IN.PACT balloons. There was a focal stenosis in the mid superficial femoral artery at the site of the most severe stenosis which could not be completely opened using the IN.PACT balloon. This remained present with a moderate to high-grade residual focal stenosis on follow-up angiography. There are multifocal mild non flow limiting linear dissections. The remaining residual stenosis was then treated using a 5 x 40 mm are mottled balloon. The are mild balloon was successfully expanded to full effacement with rupture of the plaque. The balloon was left inflated for a prolonged period of time. Follow-up arteriography demonstrates improved luminal gain. There is no residual significant stenosis. Lateral foot arteriography was again performed demonstrating no evidence of distal embolic  phenomenon. The wires and catheter were removed. Hemostasis was attained with the assistance of a Cordis Exoseal extra arterial vascular plug. The patient tolerated the procedure well. IMPRESSION: 1. Left lower extremity diagnostic arteriography demonstrates a diffusely diseased superficial femoral artery with multiple areas of moderate and high-grade focal stenosis secondary to heavily calcified atherosclerotic plaque. Additionally, there is a focal high-grade stenosis in the P2 segment of the popliteal artery. 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 2. Successful orbital atherectomy and drug coated balloon angioplasty of the popliteal artery to 5 mm, in the superficial femoral artery to 6 mm. PLAN: While blood flow is significantly improved, patient would likely benefit from reconstruction of the occluded tibial arteries. We will arrange for a second session arteriogram in the near future to attempt to re- vascularized the posterior tibial, and possibly the anterior tibial arteries. Recommend continued wound care and possible debridement. Patient would like to avoid amputation if at all possible. Signed, Criselda Peaches, MD [facility] Radiology Electronically Signed   By: Jacqulynn Cadet M.D.   On: 02/10/2017 19:26    Labs:  CBC:  Recent Labs  02/08/17 0552 02/09/17 0541 02/10/17 1111 02/11/17 0421  WBC 7.0 6.4 6.5 6.7  HGB 11.2* 11.1* 12.6* 11.4*  HCT 35.4* 34.2* 39.8 36.1*  PLT 234 209 243 190  COAGS:  Recent Labs  02/23/16 1956 02/10/17 0413  INR 1.20 1.08  APTT 33  --     BMP:  Recent Labs  02/08/17 0552 02/09/17 0541 02/10/17 1111 02/11/17 0421  NA 137 135 137 135  K 4.3 4.2 4.7 4.4  CL 102 100* 102 102  CO2 27 28 26 26   GLUCOSE 218* 230* 198* 294*  BUN 21* 20 17 17   CALCIUM 8.7* 8.8* 9.6 8.7*   CREATININE 1.34* 1.37* 1.23 1.21  GFRNONAA 53* 51* 59* 60*  GFRAA >60 60* >60 >60    LIVER FUNCTION TESTS:  Recent Labs  02/23/16 1956 04/14/16 1059 12/12/16 1259  BILITOT 0.8 0.5 0.4  AST 52* 41* 29  ALT 62 52* 29  ALKPHOS 43 39* 48  PROT 6.2* 6.6 7.0  ALBUMIN 4.0 4.3 3.8    Assessment and Plan:  Lots better looking foot today No surgical needs per Dr Sharol Given note Will follow with Radiology- Dr Laurence Ferrari Pt will hear from scheduler for time and date  Electronically Signed: Niaja Stickley A, PA-C 02/13/2017, 12:21 PM   I spent a total of 15 Minutes at the the patient's bedside AND on the patient's hospital floor or unit, greater than 50% of which was counseling/coordinating care for left foot revascularization

## 2017-02-13 NOTE — Progress Notes (Signed)
Patient ID: [name], male   DOB: 24-Oct-1945, 71 y.o.   MRN: [id number] Patient is status post endovascular procedure for infection left great toe. Examination the cellulitis in  the patient's forefoot has completely resolved. The fluctuance and cellulitis around the great toe is also resolved. There is no swelling no signs of infection. I will hold on any type of amputation surgery at this time. I will follow-up in the office as an outpatient.

## 2017-02-13 NOTE — Consult Note (Signed)
   [facility] CM Inpatient Consult   02/13/2017  [name] [birth year] [id number]    Came to visit [name] at bedside as he is active with [place] [facility] program. Please see chart review then encounter tab for further patient outreach details by [facility] RNCM.   Discussed ongoing [place] [facility] follow up. [name] remains agreeable.   Sent notification to inpatient RNCM to make aware that patient is active with [facility].  [place] will follow up with [name] post discharge.    Marthenia Rolling, MSN-Ed, RN,BSN [facility] Liaison [phone]

## 2017-02-13 NOTE — Progress Notes (Signed)
Pharmacy Antibiotic Note  [name] is a 71 y.o. male admitted on 02/07/2017 with wound infection.  Pharmacy has been consulted for Vancomycin and Cefepime dosing.  Continues on broad spectrum abx for LLE wound with osteo & abscess. Feels better & foot looks better. MRI (+)osteo, septic arthritis, abscess. IR seeing for angio, may need amputation but Ortho stats healing well so holding off and will follow up as outpatient. Afebrile, WBC wnl.    Plan:  Continue vancomycin 1g IV q12h Continue cefepime 2gm IV q12hrs Monitor clinical picture, renal function, VT prn F/U abx deescalation / LOT?  Height: 5\' 10"  (177.8 cm) Weight: 238 lb 9.6 oz (108.2 kg) IBW/kg (Calculated) : 73  Temp (24hrs), Avg:97.8 F (36.6 C), Min:97.6 F (36.4 C), Max:98.2 F (36.8 C)   Recent Labs Lab 02/07/17 1540 02/08/17 0552 02/09/17 0541 02/10/17 1111 02/10/17 1843 02/11/17 0421  WBC 7.4 7.0 6.4 6.5  --  6.7  CREATININE 1.58* 1.34* 1.37* 1.23  --  1.21  VANCOTROUGH  --   --   --   --  18  --     Estimated Creatinine Clearance: 72 mL/min (by C-G formula based on SCr of 1.21 mg/dL).    No Known Allergies  Antimicrobials this admission: Vancomycin 6/26 >>  Cefepime 6/26 >>   Dose adjustments this admission: 6/29 VT = 11mcg/mL on 1g q12h > no changes  Microbiology results:  BCx: ngF  MRSA PCR: neg  Thank you for allowing pharmacy to be a part of this patient's care.  Reginia Naas 02/13/2017 9:15 AM

## 2017-02-13 NOTE — Patient Outreach (Signed)
[facility]) Care Management  02/13/2017  HOLT WOOLBRIGHT 01-06-1946 [id number]   Care coordination  Mr Deziel per EPIC notes remains at [facility] where he was transferred to on 02/07/17 for evaluation by vascular surgery for potential revascularization and amputation Has noted improvement of left foot wound healing Has been seen by DM coordinator with recommendations for change in DM management A1c 6.9 on 02/07/17 Recommends increase in lantus to 30-32 u bid and novolog meal coverage to 10 units tid on 02/13/17 Has been seen by orthopedics 02/13/17 after s/p endovascular procedure for infection of left great toe ib 02/12/17.  Holding off on amputation surgery at this time and Mr Flury can be followed up in Orthopedic office as outpatient  Plans  [facility] CM will continue to monitor for discharge and see Mr Brallier at home outpatient for [facility] CM home visit -transition of care services  Kimberly L. Lavina Hamman, RN, BSN, [place] Care Management [phone]

## 2017-02-14 ENCOUNTER — Ambulatory Visit (HOSPITAL_COMMUNITY): Payer: Medicare Other

## 2017-02-14 LAB — GLUCOSE, CAPILLARY
GLUCOSE-CAPILLARY: 281 mg/dL — AB (ref 65–99)
Glucose-Capillary: 263 mg/dL — ABNORMAL HIGH (ref 65–99)
Glucose-Capillary: 258 mg/dL — ABNORMAL HIGH (ref 65–99)

## 2017-02-14 MED ORDER — INSULIN GLARGINE 100 UNIT/ML ~~LOC~~ SOLN: 50.0000 [IU] | Freq: Two times a day (BID) | SUBCUTANEOUS | 11 refills | Status: AC

## 2017-02-14 MED ORDER — ERTAPENEM IV (FOR PTA / DISCHARGE USE ONLY): 1.0000 g | INTRAVENOUS | 0 refills | Status: DC

## 2017-02-14 MED ORDER — INSULIN LISPRO 100 UNIT/ML ~~LOC~~ SOLN: 8.0000 [IU] | Freq: Three times a day (TID) | SUBCUTANEOUS | 0 refills | Status: AC

## 2017-02-14 MED ORDER — SODIUM CHLORIDE 0.9% FLUSH: 10.0000 mL | INTRAVENOUS | Status: DC | PRN

## 2017-02-14 NOTE — Progress Notes (Signed)
[facility] for Infectious Disease  Date of Admission:  02/07/2017    Total days of antibiotics 8  Ertapenem. Day 2  End date. 03/28/17.        Cefepime. (6/26-7/2)         Vancomycin. (6/26-7/2)         ASSESSMENT: Complicated diabetic foot infection with osteomyelitis. Patient seems improving, discussed again how to take care of his IV antibiotics, patient seems understanding and agreeable. He understands that this is a difficult infection to treat. He wants to try everything possible thing to save his limb. He will get his PICC line this morning. [facility] will provide Home Infusion Pharmacy services for IV ABX upon DC. Patient will need to follow-up with Dr. Megan Salon in 4-5 weeks at [place] outpatient clinic. He will need to follow up with Dr. Lawrence Santiago surgery.  PLAN: 1. Continue  Ertapenem 1 g IV daily for 6 weeks-started yesterday-stop date 03/28/2017. 2. Follow-up with Dr. 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 alprazolam  1 mg Oral TID  . amiodarone  200 mg Oral Daily  . apixaban  5 mg Oral BID  . atorvastatin  20 mg Oral q1800  . docusate sodium  100 mg Oral BID  . fenofibrate  160 mg Oral Daily  . ferrous sulfate  325 mg Oral Q breakfast  . gabapentin  300 mg Oral BID  . insulin aspart  0-20 Units Subcutaneous TID WC  . insulin aspart  0-5 Units Subcutaneous QHS  . insulin aspart  5 Units Subcutaneous TID WC  . insulin glargine  30 Units Subcutaneous BID  . levothyroxine   25 mcg Oral QAC breakfast  . metoprolol succinate  25 mg Oral Daily  . omega-3 acid ethyl esters  1 g Oral Daily  . pantoprazole  40 mg Oral BID  . torsemide  10 mg Oral Daily    SUBJECTIVE: Patient was feeling better when seen this morning. He has no complaints. He wants to go home. He was explained about PICC line-likely to get it this morning and then can be discharged. [place] arrangements has been made.  Review of Systems: ROS  No Known Allergies  OBJECTIVE: Vitals:   02/13/17 0444 02/13/17 1251 02/13/17 2040 02/14/17 0529  BP: (!) 142/50 137/69 (!) 143/60 127/60  Pulse: 61 66 77 63  Resp: 18 18 19 18   Temp: 97.6 F (36.4 C) 97.8 F (36.6 C) 98.1 F (36.7 C) 98.1 F (36.7 C)  TempSrc: Oral Oral Oral Oral  SpO2: 95% 97% 98% 94%  Weight:      Height:       Body mass index is 34.24 kg/m.  Physical Exam   Gen. Well-developed, obese gentleman, in no acute distress. Extremities. Mild erythema of left forefoot, seems improving as compared to yesterday. No obvious discharge. No edema, pulses intact and symmetrical. Chest. Clear bilaterally. CVS. Regular rate and rhythm. Abdomen. Soft, obese, nontender, bowel  sounds positive.  Lab Results Lab Results  Component Value Date   WBC 6.7 02/11/2017   HGB 11.4 (L) 02/11/2017   HCT 36.1 (L) 02/11/2017   MCV 87.4 02/11/2017   PLT 190 02/11/2017    Lab Results  Component Value Date   CREATININE 1.21 02/11/2017   BUN 17 02/11/2017   NA 135 02/11/2017   K 4.4 02/11/2017   CL 102 02/11/2017   CO2 26 02/11/2017    Lab Results  Component Value Date   ALT 29 12/12/2016   AST 29 12/12/2016   ALKPHOS 48 12/12/2016   BILITOT 0.4 12/12/2016     Microbiology: Recent Results (from the past 240 hour(s))  Culture, blood (Routine X 2) w Reflex to ID Panel     Status: None   Collection Time: 02/07/17  3:40 PM  Result Value Ref Range Status   Specimen Description   Final    RIGHT ANTECUBITAL BOTTLES DRAWN AEROBIC AND  ANAEROBIC   Special Requests Blood Culture adequate volume  Final   Culture NO GROWTH 5 DAYS  Final   Report Status 02/12/2017 FINAL  Final  Culture, blood (Routine X 2) w Reflex to ID Panel     Status: None   Collection Time: 02/07/17  3:40 PM  Result Value Ref Range Status   Specimen Description   Final    RIGHT ANTECUBITAL BOTTLES DRAWN AEROBIC AND ANAEROBIC   Special Requests   Final    Blood Culture results may not be optimal due to an inadequate volume of blood received in culture bottles   Culture NO GROWTH 5 DAYS  Final   Report Status 02/12/2017 FINAL  Final  MRSA PCR Screening     Status: None   Collection Time: 02/09/17  5:09 PM  Result Value Ref Range Status   MRSA by PCR NEGATIVE NEGATIVE Final    Comment:        The GeneXpert MRSA Assay (FDA approved for NASAL specimens only), is one component of a comprehensive MRSA colonization surveillance program. It is not intended to diagnose MRSA infection nor to guide or monitor treatment for MRSA infections.     Lorella Nimrod, [place] [facility] [place] [facility] [phone] pager   [phone] cell 02/14/2017, 10:15 AM

## 2017-02-14 NOTE — Discharge Summary (Signed)
Physician Discharge Summary  KNUT RONDINELLI  QJJ:[id number]  DOB: [birth year]  DOA: 02/07/2017 PCP: Redmond School, MD  Admit date: 02/07/2017 Discharge date: 02/14/2017  Admitted From: Home Disposition:  Home  Recommendations for Outpatient Follow-up:  1. Follow up with PCP in 1- week 2. Please obtain BMP/CBC in one week to monitor hemoglobin and renal function 3. Follow-up with ID in 4-5 weeks 4. Follow-up with Dr. Sharol Given 5. Follow-up with IR Dr. Laurence Ferrari   Home Health: RN Equipment/Devices: PICC line   Discharge Condition: Stable  CODE STATUS: Full code  Diet recommendation: Heart Healthy / Carb Modified   Brief/Interim Summary: 71 year old man admitted to the hospital on 6/26 from his PCPs office due to worsening edema and erythema of his left foot. MRI shows evidence for osteomyelitis, septic arthritis and an abscess along the aspect of the first MTP joint and admission has been requested. Seen by general surgery today who is recommended transfer to [facility] for evaluation by vascular surgery for potential revascularization and amputation. IR has evaluated patient and perform revascularization. Orthopedic surgeon was consulted who recommended no surgical intervention at this time and treat infection with IV antibiotics. ID was consulted recommended Invanz for 6 week. PICC line was placed with no complications. Patient stable for discharge, clinical improving.  Subjective: Patient seen and examined on the day of discharge, continues to do well cellulitis continues to improve. Patient remains afebrile. No acute events overnight.  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 -IV antibiotics Invanz for 6 week, PICC line in place/ will follow up with ID. home health RN is being arranged -MRI shows severe extensive bone destruction of the distal  half of the first metatarsal and base of the first proximal phalanx centered around the first MTP joint most consistent with septic arthritis and osteomyelitis as well as an abscess along the medial aspect of the first MTP joint. -Patient was admitted for cellulitis in the same area with no signs of bone infection back in April 2018 -Patient s/p angiogram, with improve blood flow to foot. IR planning to perform another intervention as outpatient  -Orthopedic surgery no plans for amputation at this time  -Can resume anticoagulation    Peripheral vascular disease -04/23/15--right lower extremity arteriogram with recanalization of the right SFA occlusion and angioplasty/stenting of the right SFA and popliteal artery -10/07/15--right atherectomy and drug-eluting balloon angioplasty of the distal SFA/popliteal stenosis as well as atherectomy and angioplasty of a peroneal artery stenosis -12/12/16--ABIwere 0.41 on the right and 0.62 on the left, previously 0.66 and 0.61 respectively -02/10/17 s/p LLE angiogram and orbital atherectomy w DCB-PTA of multiple severe stenoses throughout the SFA and pop arteries.   Paroxysmal A. Fib - remains stable  -Currently in sinus rhythm and rate controlled -Continue amiodarone and metoprolol, -Continue Eliquis   Type 2 diabetes  -CBGs was uncontrolled uncontrolled. -Lantus adjusted to 50 units BID -Novolog increased to 5 units TID with meals  -Continue SSI and monitor CBG's with goal of < 140 fasting   Hyperlipidemia  -Continue Lovaza  Hypothyroidism -Continue Synthroid  Stage III chronic kidney disease -Baseline creatinine between 1.2 and 1.5 -Cr remains at baseline   Chronic diastolic CHF -Echo in March 2018 with an ejection fraction of 55% and grade 1 diastolic dysfunction. -Is clinically euvolemic  All other chronic medical condition were stable during the hospitalization.  On the day of  the discharge the patient's vitals were stable, and no  other acute medical condition were reported by patient. Patient was felt safe to be discharge to home.   Discharge Instructions  You were cared for by a hospitalist during your hospital stay. If you have any questions about your discharge medications or the care you received while you were in the hospital after you are discharged, you can call the unit and asked to speak with the hospitalist on call if the hospitalist that took care of you is not available. Once you are discharged, your primary care physician will handle any further medical issues. Please note that NO REFILLS for any discharge medications will be authorized once you are discharged, as it is imperative that you return to your primary care physician (or establish a relationship with a primary care physician if you do not have one) for your aftercare needs so that they can reassess your need for medications and monitor your lab values.  Discharge Instructions    Call MD for:  difficulty breathing, headache or visual disturbances    Complete by:  As directed    Call MD for:  extreme fatigue    Complete by:  As directed    Call MD for:  hives    Complete by:  As directed    Call MD for:  persistant dizziness or light-headedness    Complete by:  As directed    Call MD for:  persistant nausea and vomiting    Complete by:  As directed    Call MD for:  redness, tenderness, or signs of infection (pain, swelling, redness, odor or green/yellow discharge around incision site)    Complete by:  As directed    Call MD for:  severe uncontrolled pain    Complete by:  As directed    Call MD for:  temperature >100.4    Complete by:  As directed    Diet - low sodium heart healthy    Complete by:  As directed    Home infusion instructions [place] May follow [place] Dosing Protocol; May administer Cathflo as needed to maintain patency of vascular access device.; Flushing of vascular access device: per [facility] Protocol: 0.9% NaCl  pre/post medica...    Complete by:  As directed    Instructions:  May follow [place] Dosing Protocol   Instructions:  May administer Cathflo as needed to maintain patency of vascular access device.   Instructions:  Flushing of vascular access device: per [facility] Protocol: 0.9% NaCl pre/post medication administration and prn patency; Heparin 100 u/ml, 37m for implanted ports and Heparin 10u/ml, 562mfor all other central venous catheters.   Instructions:  May follow [facility] Anaphylaxis Protocol for First Dose Administration in the home: 0.9% NaCl at 25-50 ml/hr to maintain IV access for protocol meds. Epinephrine 0.3 ml IV/IM PRN and Benadryl 25-50 IV/IM PRN s/s of anaphylaxis.   Instructions:  [facility] Coordinator (RN) to assist per patient IV care needs in the home PRN.   Increase activity slowly    Complete by:  As directed      Allergies as of 02/14/2017   No Known Allergies     Medication List    STOP taking these medications   amoxicillin-clavulanate 875-125 MG tablet Commonly known as:  AUGMENTIN   sulfamethoxazole-trimethoprim 800-160 MG tablet Commonly known as:  BACTRIM DS,SEPTRA DS     TAKE these medications   acetaminophen 500 MG tablet Commonly known as:  TYLENOL Take 1,000 mg by mouth 2 (two) times daily as needed for headache. For pain/headaches   alprazolam 2 MG tablet Commonly known as:  XANAX Take 1 mg by mouth 3 (three) times daily.   amiodarone 200 MG tablet Commonly known as:  PACERONE Take 1 tablet (200 mg total) by mouth daily.   apixaban 5 MG Tabs tablet Commonly known as:  ELIQUIS Take 1 tablet (5 mg total) by mouth 2 (two) times daily.   atorvastatin 20 MG tablet Commonly known as:  LIPITOR Take 1 tablet (20 mg total) by mouth daily at 6 PM.   ertapenem IVPB Commonly known as:  INVANZ Inject 1 g into the vein daily. Indication: Osteomyelitis Last Day of Therapy: 03/28/17 Labs - Once weekly:  CBC/D and BMP, Labs - Every other week:   ESR and CRP   fenofibrate 145 MG tablet Commonly known as:  TRICOR TAKE ONE TABLET EACH DAY.   ferrous sulfate 325 (65 FE) MG EC tablet Take 325 mg by mouth daily with breakfast.   Fish Oil 1200 MG Caps Take 1 capsule by mouth daily.   gabapentin 300 MG capsule Commonly known as:  NEURONTIN Take 300 mg by mouth 2 (two) times daily.   insulin glargine 100 UNIT/ML injection Commonly known as:  LANTUS Inject 0.5 mLs (50 Units total) into the skin 2 (two) times daily. 75 units in the morning then 60 units at bedtime What changed:  how much to take   insulin lispro 100 UNIT/ML injection Commonly known as:  HUMALOG Inject 0.08 mLs (8 Units total) into the skin 3 (three) times daily with meals. What changed:  how much to take  when to take this  additional instructions   INVOKANA 100 MG Tabs tablet Generic drug:  canagliflozin Take 1 tablet by mouth daily.   levothyroxine 25 MCG tablet Commonly known as:  SYNTHROID, LEVOTHROID Take 25 mcg by mouth daily before breakfast.   meclizine 25 MG tablet Commonly known as:  ANTIVERT Take 25 mg by mouth 3 (three) times daily as needed for dizziness.   metoprolol succinate 25 MG 24 hr tablet Commonly known as:  TOPROL-XL Take 1 tablet (25 mg total) by mouth daily.   pantoprazole 40 MG tablet Commonly known as:  PROTONIX Take 1 tablet (40 mg total) by mouth 2 (two) times daily.   silver sulfADIAZINE 1 % cream Commonly known as:  SILVADENE Apply 1 application topically daily.   torsemide 10 MG tablet Commonly known as:  DEMADEX Take 1 tablet (10 mg total) by mouth daily.            Home Infusion Instuctions        Start     Ordered   02/14/17 0000  Home infusion instructions [facility] May follow [place] Dosing Protocol; May administer Cathflo as needed to maintain patency of vascular access device.; Flushing of vascular access device: per [facility] Protocol: 0.9% NaCl pre/post medica...    Question Answer  Comment  Instructions May follow [place] Dosing Protocol   Instructions May administer Cathflo as needed to maintain patency of vascular access device.   Instructions Flushing of vascular access device: per [facility] Protocol: 0.9% NaCl pre/post medication administration and prn patency; Heparin 100 u/ml, 48m for implanted ports and Heparin 10u/ml, 56mfor all other central venous catheters.   Instructions May follow [facility] Anaphylaxis Protocol for First Dose Administration in the home: 0.9% NaCl at 25-50 ml/hr to maintain IV access for protocol meds.  Epinephrine 0.3 ml IV/IM PRN and Benadryl 25-50 IV/IM PRN s/s of anaphylaxis.   Instructions [facility] Infusion Coordinator (RN) to assist per patient IV care needs in the home PRN.      02/14/17 1448     Follow-up Information    Newt Minion, MD Follow up in 1 week(s).   Specialty:  Orthopedic Surgery Contact information: [name] [address] [phone]        Jacqulynn Cadet, MD Follow up in 2 week(s).   Specialties:  Interventional Radiology, Radiology Why:  pt will hear from scheduler for follow up time and date with Dr Berna Spare information: [address] [place] [address] [phone]        Redmond School, MD. Schedule an appointment as soon as possible for a visit in 1 week(s).   Specialty:  Internal Medicine Why:  Hospital follow up  Contact information: [address] [place] [address] [phone]        Michel Bickers, MD. Schedule an appointment as soon as possible for a visit in 4 week(s).   Specialty:  Infectious Diseases Contact information: [address] [place] [address] [phone]          No Known Allergies  Consultations:  ID  IR  Orthopedic surgery   Procedures/Studies: Ir Angiogram Extremity Left  Result Date: 02/10/2017 INDICATION: 71 year old male with Rutherford category 5/6 critical  limb ischemia involving the left forefoot. He presents for arteriogram and endovascular optimization of blood flow to the wound. EXAM: LEFT EXTREMITY ARTERIOGRAPHY; IR ULTRASOUND GUIDANCE VASC ACCESS RIGHT; IR FEM POP ART ATHERECT INC PTA MEDICATIONS: Patient is currently an inpatient receiving intravenous antibiotics. No additional antibiotics were administered. 8000 units heparin and 1100 mcg nitroglycerin were administered. ANESTHESIA/SEDATION: Moderate (conscious) sedation was employed during this procedure. A total of Versed 3.5 mg and Fentanyl 175 mcg was administered intravenously. Moderate Sedation Time: 125 minutes. The patient's level of consciousness and vital signs were monitored continuously by radiology nursing throughout the procedure under my direct supervision. CONTRAST:  21m VISIPAQUE IODIXANOL 320 MG/ML IV SOLN, 223mVISIPAQUE IODIXANOL 320 MG/ML IV SOLN FLUOROSCOPY TIME:  Fluoroscopy Time: 26 minutes 36 seconds (219 mGy). COMPLICATIONS: None immediate. PROCEDURE: Informed consent was obtained from the patient following explanation of the procedure, risks, benefits and alternatives. The patient understands, agrees and consents for the procedure. All questions were addressed. A time out was performed prior to the initiation of the procedure. Maximal barrier sterile technique utilized including caps, mask, sterile gowns, sterile gloves, large sterile drape, hand hygiene, and Betadine prep. The right common femoral artery was interrogated with ultrasound and found to be widely patent. An image was obtained and stored for the medical record. Local anesthesia was attained by infiltration with 1% lidocaine. A small dermatotomy was made. Under real-time sonographic guidance, the vessel was punctured with a 21 gauge micropuncture needle. Using standard technique, the initial micro needle was exchanged over a 0.018 micro wire for a transitional 4 FrPakistanicro sheath. The micro sheath was then exchanged  over a 0.035 wire for a 5 French vascular sheath. An Omni flush catheter was advanced to the aortic bifurcation and pelvic arteriography was performed. Both internal iliac arteries are widely patent. The common and external iliac arteries are widely patent. The Omni flush catheter was then advanced up in over the aortic bifurcation and into the left common femoral artery. A multi station left lower extremity arteriogram was then  performed. There is a multifocal calcified atherosclerotic plaque throughout the superficial femoral artery. There are multiple regions of a moderate and high-grade focal stenosis. The most critical stenosis is in the region of of the mid thigh were the vessel is approximately 90% stenosis. The distal most superficial femoral artery in the P1 segment of the popliteal artery are relatively spared from disease. However, there is a focal high-grade stenosis in the P2 segment of the popliteal artery just above the joint space. Significant runoff disease. Both the anterior tibial and posterior tibial arteries are occluded. The peroneal artery provides the dominant runoff to the foot and reconstitutes both the distal posterior tibial artery and the dorsalis pedis artery. The plantar loop is intact. A CX I catheter was advanced over a Bentson wire into the distal popliteal artery. The Bentson wire was then exchanged for a by per wire. Multifocal orbital atherectomy was performed throughout the superficial femoral and popliteal artery easy using a 2.0 classic crown for the CS I Diamond back device. Following atherectomy, multi station drug-eluting balloon angioplasty was performed. The P2 segment popliteal artery was treated using a 5 x 120 IN.PACT balloon. The remaining superficial femoral artery was treated with minimally overlapping stations using two 6 x 150 IN.PACT balloons. There was a focal stenosis in the mid superficial femoral artery at the site of the most severe stenosis which could not  be completely opened using the IN.PACT balloon. This remained present with a moderate to high-grade residual focal stenosis on follow-up angiography. There are multifocal mild non flow limiting linear dissections. The remaining residual stenosis was then treated using a 5 x 40 mm are mottled balloon. The are mild balloon was successfully expanded to full effacement with rupture of the plaque. The balloon was left inflated for a prolonged period of time. Follow-up arteriography demonstrates improved luminal gain. There is no residual significant stenosis. Lateral foot arteriography was again performed demonstrating no evidence of distal embolic phenomenon. The wires and catheter were removed. Hemostasis was attained with the assistance of a Cordis Exoseal extra arterial vascular plug. The patient tolerated the procedure well. IMPRESSION: 1. Left lower extremity diagnostic arteriography demonstrates a diffusely diseased superficial femoral artery with multiple areas of moderate and high-grade focal stenosis secondary to heavily calcified atherosclerotic plaque. Additionally, there is a focal high-grade stenosis in the P2 segment of the popliteal artery. 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 2. Successful orbital atherectomy and drug coated balloon angioplasty of the popliteal artery to 5 mm, in the superficial femoral artery to 6 mm. PLAN: While blood flow is significantly improved, patient would likely benefit from reconstruction of the occluded tibial arteries. We will arrange for a second session arteriogram in the near future to attempt to re- vascularized the posterior tibial, and possibly the anterior tibial arteries. Recommend continued wound care and possible debridement. Patient would like to avoid amputation if at all possible. Signed, Criselda Peaches, MD [facility] Radiology Electronically Signed   By: Jacqulynn Cadet M.D.   On: 02/10/2017 19:26   Mr Foot Left Wo Contrast  Result Date: 02/08/2017 CLINICAL DATA:  Pain and discoloration of the distal left foot. EXAM: MRI OF THE LEFT FOOT WITHOUT CONTRAST TECHNIQUE: Multiplanar, multisequence MR imaging of the left forefoot was performed. No intravenous contrast was administered. COMPARISON:  12/12/2016 FINDINGS: Bones/Joint/Cartilage Soft tissue wound along the plantar aspect of the  first MTP joint. 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 7.3 x 20 mm complex fluid collection along the medial aspect of the first MTP joint concerning for an abscess. No acute fracture or dislocation. No other marrow signal abnormality. Normal alignment. No joint effusion. Ligaments Collateral ligaments are intact.  Lisfranc ligament is intact. Muscles and Tendons Flexor, peroneal and extensor compartment tendons are intact. No significant muscle atrophy. Mild diffuse increased T2 hyperintense signal throughout the muscles likely neurogenic given the patient's history diabetes versus less likely myositis. Soft tissue No other fluid collection or hematoma.  No soft tissue mass. IMPRESSION: Soft tissue wound along the plantar aspect of the first MTP joint. 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 7.3 x 20 mm abscess along the medial aspect of the first MTP joint. Electronically Signed   By: Kathreen Devoid   On: 02/08/2017 08:25   [place] Pta Mod Sed  Result Date: 02/10/2017 INDICATION: 71 year old male with Rutherford category 5/6 critical limb ischemia  involving the left forefoot. He presents for arteriogram and endovascular optimization of blood flow to the wound. EXAM: LEFT EXTREMITY ARTERIOGRAPHY; IR ULTRASOUND GUIDANCE VASC ACCESS RIGHT; IR FEM POP ART ATHERECT INC PTA MEDICATIONS: Patient is currently an inpatient receiving intravenous antibiotics. No additional antibiotics were administered. 8000 units heparin and 1100 mcg nitroglycerin were administered. ANESTHESIA/SEDATION: Moderate (conscious) sedation was employed during this procedure. A total of Versed 3.5 mg and Fentanyl 175 mcg was administered intravenously. Moderate Sedation Time: 125 minutes. The patient's level of consciousness and vital signs were monitored continuously by radiology nursing throughout the procedure under my direct supervision. CONTRAST:  93m VISIPAQUE IODIXANOL 320 MG/ML IV SOLN, 213mVISIPAQUE IODIXANOL 320 MG/ML IV SOLN FLUOROSCOPY TIME:  Fluoroscopy Time: 26 minutes 36 seconds (219 mGy). COMPLICATIONS: None immediate. PROCEDURE: Informed consent was obtained from the patient following explanation of the procedure, risks, benefits and alternatives. The patient understands, agrees and consents for the procedure. All questions were addressed. A time out was performed prior to the initiation of the procedure. Maximal barrier sterile technique utilized including caps, mask, sterile gowns, sterile gloves, large sterile drape, hand hygiene, and Betadine prep. The right common femoral artery was interrogated with ultrasound and found to be widely patent. An image was obtained and stored for the medical record. Local anesthesia was attained by infiltration with 1% lidocaine. A small dermatotomy was made. Under real-time sonographic guidance, the vessel was punctured with a 21 gauge micropuncture needle. Using standard technique, the initial micro needle was exchanged over a 0.018 micro wire for a transitional 4 FrPakistanicro sheath. The micro sheath was then exchanged over a 0.035  wire for a 5 French vascular sheath. An Omni flush catheter was advanced to the aortic bifurcation and pelvic arteriography was performed. Both internal iliac arteries are widely patent. The common and external iliac arteries are widely patent. The Omni flush catheter was then advanced up in over the aortic bifurcation and into the left common femoral artery. A multi station left lower extremity arteriogram was then performed. There is a multifocal calcified atherosclerotic plaque throughout the superficial femoral artery. There are multiple regions of a moderate and high-grade focal stenosis. The most  critical stenosis is in the region of of the mid thigh were the vessel is approximately 90% stenosis. The distal most superficial femoral artery in the P1 segment of the popliteal artery are relatively spared from disease. However, there is a focal high-grade stenosis in the P2 segment of the popliteal artery just above the joint space. Significant runoff disease. Both the anterior tibial and posterior tibial arteries are occluded. The peroneal artery provides the dominant runoff to the foot and reconstitutes both the distal posterior tibial artery and the dorsalis pedis artery. The plantar loop is intact. A CX I catheter was advanced over a Bentson wire into the distal popliteal artery. The Bentson wire was then exchanged for a by per wire. Multifocal orbital atherectomy was performed throughout the superficial femoral and popliteal artery easy using a 2.0 classic crown for the CS I Diamond back device. Following atherectomy, multi station drug-eluting balloon angioplasty was performed. The P2 segment popliteal artery was treated using a 5 x 120 IN.PACT balloon. The remaining superficial femoral artery was treated with minimally overlapping stations using two 6 x 150 IN.PACT balloons. There was a focal stenosis in the mid superficial femoral artery at the site of the most severe stenosis which could not be completely  opened using the IN.PACT balloon. This remained present with a moderate to high-grade residual focal stenosis on follow-up angiography. There are multifocal mild non flow limiting linear dissections. The remaining residual stenosis was then treated using a 5 x 40 mm are mottled balloon. The are mild balloon was successfully expanded to full effacement with rupture of the plaque. The balloon was left inflated for a prolonged period of time. Follow-up arteriography demonstrates improved luminal gain. There is no residual significant stenosis. Lateral foot arteriography was again performed demonstrating no evidence of distal embolic phenomenon. The wires and catheter were removed. Hemostasis was attained with the assistance of a Cordis Exoseal extra arterial vascular plug. The patient tolerated the procedure well. IMPRESSION: 1. Left lower extremity diagnostic arteriography demonstrates a diffusely diseased superficial femoral artery with multiple areas of moderate and high-grade focal stenosis secondary to heavily calcified atherosclerotic plaque. Additionally, there is a focal high-grade stenosis in the P2 segment of the popliteal artery. 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 2. Successful orbital atherectomy and drug coated balloon angioplasty of the popliteal artery to 5 mm, in the superficial femoral artery to 6 mm. PLAN: While blood flow is significantly improved, patient would likely benefit from reconstruction of the occluded tibial arteries. We will arrange for a second session arteriogram in the near future to attempt to re- vascularized the posterior tibial, and possibly the anterior tibial arteries. Recommend continued wound care and possible debridement. Patient would like to avoid amputation if at all possible. Signed, Criselda Peaches, MD Vascular and Interventional Radiology  Specialists [facility] Radiology Electronically Signed   By: Jacqulynn Cadet M.D.   On: 02/10/2017 19:26   Ir US Guide Vasc Access Right  Result Date: 02/10/2017 INDICATION: 71 year old male with Rutherford category 5/6 critical limb ischemia involving the left forefoot. He presents for arteriogram and endovascular optimization of blood flow to the wound. EXAM: LEFT EXTREMITY ARTERIOGRAPHY; IR ULTRASOUND GUIDANCE VASC ACCESS RIGHT; IR FEM POP ART ATHERECT INC PTA MEDICATIONS: Patient is currently an inpatient receiving intravenous antibiotics. No additional antibiotics were administered. 8000 units heparin and 1100 mcg nitroglycerin were administered. ANESTHESIA/SEDATION: Moderate (conscious) sedation was employed during  this procedure. A total of Versed 3.5 mg and Fentanyl 175 mcg was administered intravenously. Moderate Sedation Time: 125 minutes. The patient's level of consciousness and vital signs were monitored continuously by radiology nursing throughout the procedure under my direct supervision. CONTRAST:  58m VISIPAQUE IODIXANOL 320 MG/ML IV SOLN, 29mVISIPAQUE IODIXANOL 320 MG/ML IV SOLN FLUOROSCOPY TIME:  Fluoroscopy Time: 26 minutes 36 seconds (219 mGy). COMPLICATIONS: None immediate. PROCEDURE: Informed consent was obtained from the patient following explanation of the procedure, risks, benefits and alternatives. The patient understands, agrees and consents for the procedure. All questions were addressed. A time out was performed prior to the initiation of the procedure. Maximal barrier sterile technique utilized including caps, mask, sterile gowns, sterile gloves, large sterile drape, hand hygiene, and Betadine prep. The right common femoral artery was interrogated with ultrasound and found to be widely patent. An image was obtained and stored for the medical record. Local anesthesia was attained by infiltration with 1% lidocaine. A small dermatotomy was made. Under real-time sonographic  guidance, the vessel was punctured with a 21 gauge micropuncture needle. Using standard technique, the initial micro needle was exchanged over a 0.018 micro wire for a transitional 4 FrPakistanicro sheath. The micro sheath was then exchanged over a 0.035 wire for a 5 French vascular sheath. An Omni flush catheter was advanced to the aortic bifurcation and pelvic arteriography was performed. Both internal iliac arteries are widely patent. The common and external iliac arteries are widely patent. The Omni flush catheter was then advanced up in over the aortic bifurcation and into the left common femoral artery. A multi station left lower extremity arteriogram was then performed. There is a multifocal calcified atherosclerotic plaque throughout the superficial femoral artery. There are multiple regions of a moderate and high-grade focal stenosis. The most critical stenosis is in the region of of the mid thigh were the vessel is approximately 90% stenosis. The distal most superficial femoral artery in the P1 segment of the popliteal artery are relatively spared from disease. However, there is a focal high-grade stenosis in the P2 segment of the popliteal artery just above the joint space. Significant runoff disease. Both the anterior tibial and posterior tibial arteries are occluded. The peroneal artery provides the dominant runoff to the foot and reconstitutes both the distal posterior tibial artery and the dorsalis pedis artery. The plantar loop is intact. A CX I catheter was advanced over a Bentson wire into the distal popliteal artery. The Bentson wire was then exchanged for a by per wire. Multifocal orbital atherectomy was performed throughout the superficial femoral and popliteal artery easy using a 2.0 classic crown for the CS I Diamond back device. Following atherectomy, multi station drug-eluting balloon angioplasty was performed. The P2 segment popliteal artery was treated using a 5 x 120 IN.PACT balloon. The  remaining superficial femoral artery was treated with minimally overlapping stations using two 6 x 150 IN.PACT balloons. There was a focal stenosis in the mid superficial femoral artery at the site of the most severe stenosis which could not be completely opened using the IN.PACT balloon. This remained present with a moderate to high-grade residual focal stenosis on follow-up angiography. There are multifocal mild non flow limiting linear dissections. The remaining residual stenosis was then treated using a 5 x 40 mm are mottled balloon. The are mild balloon was successfully expanded to full effacement with rupture of the plaque. The balloon was left inflated for a prolonged period of time. Follow-up arteriography demonstrates improved luminal  gain. There is no residual significant stenosis. Lateral foot arteriography was again performed demonstrating no evidence of distal embolic phenomenon. The wires and catheter were removed. Hemostasis was attained with the assistance of a Cordis Exoseal extra arterial vascular plug. The patient tolerated the procedure well. IMPRESSION: 1. Left lower extremity diagnostic arteriography demonstrates a diffusely diseased superficial femoral artery with multiple areas of moderate and high-grade focal stenosis secondary to heavily calcified atherosclerotic plaque. Additionally, there is a focal high-grade stenosis in the P2 segment of the popliteal artery. 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 2. Successful orbital atherectomy and drug coated balloon angioplasty of the popliteal artery to 5 mm, in the superficial femoral artery to 6 mm. PLAN: While blood flow is significantly improved, patient would likely benefit from reconstruction of the occluded tibial arteries. We will arrange for a second session arteriogram in the near future to attempt to re- vascularized  the posterior tibial, and possibly the anterior tibial arteries. Recommend continued wound care and possible debridement. Patient would like to avoid amputation if at all possible. Signed, Criselda Peaches, MD Vascular and Interventional Radiology Specialists [facility] Radiology Electronically Signed   By: Jacqulynn Cadet M.D.   On: 02/10/2017 19:26    Discharge Exam: Vitals:   02/14/17 0529 02/14/17 1437  BP: 127/60 138/67  Pulse: 63 72  Resp: 18 18  Temp: 98.1 F (36.7 C) 97.9 F (36.6 C)   Vitals:   02/13/17 1251 02/13/17 2040 02/14/17 0529 02/14/17 1437  BP: 137/69 (!) 143/60 127/60 138/67  Pulse: 66 77 63 72  Resp: _0 Temp: 97.8 F (36.6 C) 98.1 F (36.7 C) 98.1 F (36.7 C) 97.9 F (36.6 C)  TempSrc: Oral Oral Oral Oral  SpO2: 97% 98% 94% 100%  Weight:      Height:        General: Pt is alert, awake, not in acute distress Cardiovascular: RRR, S1/S2 +, no rubs, no gallops Respiratory: CTA bilaterally, no wheezing, no rhonchi Abdominal: Soft, NT, ND, bowel sounds + Extremities: See picture      The results of significant diagnostics from this hospitalization (including imaging, microbiology, ancillary and laboratory) are listed below for reference.     Microbiology: Recent Results (from the past 240 hour(s))  Culture, blood (Routine X 2) w Reflex to ID Panel     Status: None   Collection Time: 02/07/17  3:40 PM  Result Value Ref Range Status   Specimen Description   Final    RIGHT ANTECUBITAL BOTTLES DRAWN AEROBIC AND ANAEROBIC   Special Requests Blood Culture adequate volume  Final   Culture NO GROWTH 5 DAYS  Final   Report Status 02/12/2017 FINAL  Final  Culture, blood (Routine X 2) w Reflex to ID Panel     Status: None   Collection Time: 02/07/17  3:40 PM  Result Value Ref Range Status   Specimen Description   Final    RIGHT ANTECUBITAL BOTTLES DRAWN AEROBIC AND ANAEROBIC   Special Requests   Final    Blood Culture results may not be  optimal due to an inadequate volume of blood received in culture bottles   Culture NO GROWTH 5 DAYS  Final   Report Status 02/12/2017 FINAL  Final  MRSA PCR Screening     Status: None   Collection Time: 02/09/17  5:09 PM  Result Value Ref Range Status  MRSA by PCR NEGATIVE NEGATIVE Final    Comment:        The GeneXpert MRSA Assay (FDA approved for NASAL specimens only), is one component of a comprehensive MRSA colonization surveillance program. It is not intended to diagnose MRSA infection nor to guide or monitor treatment for MRSA infections.      Labs: BNP (last 3 results)  Recent Labs  02/23/16 1956  BNP 568.1*   Basic Metabolic Panel:  Recent Labs Lab 02/07/17 1540 02/08/17 0552 02/09/17 0541 02/10/17 1111 02/11/17 0421  NA 136 137 135 137 135  K 4.1 4.3 4.2 4.7 4.4  CL 100* 102 100* 102 102  CO2 _0 GLUCOSE 220* 218* 230* 198* 294*  BUN 24* 21* _1 CREATININE 1.58* 1.34* 1.37* 1.23 1.21  CALCIUM 9.1 8.7* 8.8* 9.6 8.7*   Liver Function Tests: No results for input(s): AST, ALT, ALKPHOS, BILITOT, PROT, ALBUMIN in the last 168 hours. No results for input(s): LIPASE, AMYLASE in the last 168 hours. No results for input(s): AMMONIA in the last 168 hours. CBC:  Recent Labs Lab 02/07/17 1540 02/08/17 0552 02/09/17 0541 02/10/17 1111 02/11/17 0421  WBC 7.4 7.0 6.4 6.5 6.7  NEUTROABS  --   --   --   --  4.0  HGB 11.9* 11.2* 11.1* 12.6* 11.4*  HCT 36.7* 35.4* 34.2* 39.8 36.1*  MCV 88.0 88.5 86.6 87.5 87.4  PLT 252 234 209 243 190   Cardiac Enzymes: No results for input(s): CKTOTAL, CKMB, CKMBINDEX, TROPONINI in the last 168 hours. BNP: Invalid input(s): POCBNP CBG:  Recent Labs Lab 02/13/17 1154 02/13/17 1708 02/13/17 2134 02/14/17 0757 02/14/17 1230  GLUCAP 339* 299* 304* 263* 258*   D-Dimer No results for input(s): DDIMER in the last 72 hours. Hgb A1c No results for input(s): HGBA1C in the last 72 hours. Lipid  Profile No results for input(s): CHOL, HDL, LDLCALC, TRIG, CHOLHDL, LDLDIRECT in the last 72 hours. Thyroid function studies No results for input(s): TSH, T4TOTAL, T3FREE, THYROIDAB in the last 72 hours.  Invalid input(s): FREET3 Anemia work up No results for input(s): VITAMINB12, FOLATE, FERRITIN, TIBC, IRON, RETICCTPCT in the last 72 hours. Urinalysis    Component Value Date/Time   COLORURINE STRAW (A) 02/23/2016 2140   APPEARANCEUR CLEAR 02/23/2016 2140   LABSPEC 1.023 02/23/2016 2140   PHURINE 7.0 02/23/2016 2140   GLUCOSEU >1000 (A) 02/23/2016 2140   HGBUR NEGATIVE 02/23/2016 2140   BILIRUBINUR NEGATIVE 02/23/2016 2140   KETONESUR NEGATIVE 02/23/2016 2140   PROTEINUR NEGATIVE 02/23/2016 2140   UROBILINOGEN 0.2 11/25/2013 1741   NITRITE NEGATIVE 02/23/2016 2140   LEUKOCYTESUR NEGATIVE 02/23/2016 2140   Sepsis Labs Invalid input(s): PROCALCITONIN,  WBC,  LACTICIDVEN Microbiology Recent Results (from the past 240 hour(s))  Culture, blood (Routine X 2) w Reflex to ID Panel     Status: None   Collection Time: 02/07/17  3:40 PM  Result Value Ref Range Status   Specimen Description   Final    RIGHT ANTECUBITAL BOTTLES DRAWN AEROBIC AND ANAEROBIC   Special Requests Blood Culture adequate volume  Final   Culture NO GROWTH 5 DAYS  Final   Report Status 02/12/2017 FINAL  Final  Culture, blood (Routine X 2) w Reflex to ID Panel     Status: None   Collection Time: 02/07/17  3:40 PM  Result Value Ref Range Status   Specimen Description   Final    RIGHT ANTECUBITAL BOTTLES DRAWN AEROBIC AND  ANAEROBIC   Special Requests   Final    Blood Culture results may not be optimal due to an inadequate volume of blood received in culture bottles   Culture NO GROWTH 5 DAYS  Final   Report Status 02/12/2017 FINAL  Final  MRSA PCR Screening     Status: None   Collection Time: 02/09/17  5:09 PM  Result Value Ref Range Status   MRSA by PCR NEGATIVE NEGATIVE Final    Comment:        The  GeneXpert MRSA Assay (FDA approved for NASAL specimens only), is one component of a comprehensive MRSA colonization surveillance program. It is not intended to diagnose MRSA infection nor to guide or monitor treatment for MRSA infections.     Time coordinating discharge: 35 minutes  SIGNED:  Chipper Oman, MD  [facility] 02/14/2017, 2:49 PM  Pager please text page via  [email or web address] Password [email or web address]

## 2017-02-14 NOTE — Progress Notes (Signed)
Peripherally Inserted Central Catheter/Midline Placement  The IV Nurse has discussed with the patient and/or persons authorized to consent for the patient, the purpose of this procedure and the potential benefits and risks involved with this procedure.  The benefits include less needle sticks, lab draws from the catheter, and the patient may be discharged home with the catheter. Risks include, but not limited to, infection, bleeding, blood clot (thrombus formation), and puncture of an artery; nerve damage and irregular heartbeat and possibility to perform a PICC exchange if needed/ordered by physician.  Alternatives to this procedure were also discussed.  Bard Power PICC patient education guide, fact sheet on infection prevention and patient information card has been provided to patient /or left at bedside.    PICC/Midline Placement Documentation  PICC Single Lumen 19/59/74 PICC Left Basilic 53 cm 0 cm (Active)   Patient cannot write. Mark witnessed by [facility] RN    Frances Maywood 02/14/2017, 11:14 AM

## 2017-02-14 NOTE — Progress Notes (Signed)
PHARMACY CONSULT NOTE FOR:  OUTPATIENT  PARENTERAL ANTIBIOTIC THERAPY (OPAT)  Indication: Osteomyelitis Regimen: Ertapenem 1g IV Q24h End date: 03/28/17  IV antibiotic discharge orders are pended. To discharging provider:  please sign these orders via discharge navigator,  Select New Orders & click on the button choice - Manage This Unsigned Work.     Thank you for allowing pharmacy to be a part of this patient's care.  Reginia Naas 02/14/2017, 1:32 PM

## 2017-02-15 DIAGNOSIS — R269 Unspecified abnormalities of gait and mobility: Secondary | ICD-10-CM | POA: Diagnosis not present

## 2017-02-15 DIAGNOSIS — J449 Chronic obstructive pulmonary disease, unspecified: Secondary | ICD-10-CM | POA: Diagnosis not present

## 2017-02-15 DIAGNOSIS — M869 Osteomyelitis, unspecified: Secondary | ICD-10-CM | POA: Diagnosis not present

## 2017-02-15 DIAGNOSIS — R0602 Shortness of breath: Secondary | ICD-10-CM | POA: Diagnosis not present

## 2017-02-15 DIAGNOSIS — Z5181 Encounter for therapeutic drug level monitoring: Secondary | ICD-10-CM | POA: Diagnosis not present

## 2017-02-15 DIAGNOSIS — L03116 Cellulitis of left lower limb: Secondary | ICD-10-CM | POA: Diagnosis not present

## 2017-02-15 DIAGNOSIS — N183 Chronic kidney disease, stage 3 (moderate): Secondary | ICD-10-CM | POA: Diagnosis not present

## 2017-02-16 ENCOUNTER — Other Ambulatory Visit (HOSPITAL_COMMUNITY): Payer: Self-pay | Admitting: Interventional Radiology

## 2017-02-16 DIAGNOSIS — L03116 Cellulitis of left lower limb: Secondary | ICD-10-CM | POA: Diagnosis not present

## 2017-02-16 DIAGNOSIS — Z5181 Encounter for therapeutic drug level monitoring: Secondary | ICD-10-CM | POA: Diagnosis not present

## 2017-02-16 DIAGNOSIS — I739 Peripheral vascular disease, unspecified: Secondary | ICD-10-CM

## 2017-02-17 ENCOUNTER — Other Ambulatory Visit: Payer: Self-pay | Admitting: *Deleted

## 2017-02-17 ENCOUNTER — Ambulatory Visit (HOSPITAL_COMMUNITY): Payer: Medicare Other | Admitting: Physical Therapy

## 2017-02-17 DIAGNOSIS — L03116 Cellulitis of left lower limb: Secondary | ICD-10-CM | POA: Diagnosis not present

## 2017-02-17 DIAGNOSIS — Z5181 Encounter for therapeutic drug level monitoring: Secondary | ICD-10-CM | POA: Diagnosis not present

## 2017-02-17 NOTE — Patient Outreach (Signed)
[place] [facility]) Care Management   02/17/2017  [name] 24-Jul-1946 [id number]  [name] is an 71 y.o. 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 [name] haspreviously worked with [facility]. [facility] hospital liaison assisted with getting THNConsent form signed by making his mark, stating he could not write.Patient gave [phone] the best number to reach him. He also gave written permission to call his niece [name] at [phone] he can not be reached.   [facility] CM is engaged for transition of care and home visits since his 02/14/17 discharge from the hospital This was the second admission in 6 months [name] previously discharged 12/14/16 He was admitted on 12/12/16 for left foot cellulitis, chronic wound, small ulcer plantar aspect of firstmetatarsal.. He has a PICC line intact in his left upper arm for home IV antibiotics   Subjective: "I'm okay today" "my sisters are visiting"  Objective:   BP 122/72   Pulse 68   Temp (!) 94.9 F (34.9 C) (Oral)   Resp 20   SpO2 96%   Review of Systems  Constitutional: Negative for chills, diaphoresis, fever, malaise/fatigue and weight loss.  HENT: Positive for hearing loss. Negative for congestion, ear discharge, ear pain, nosebleeds, sinus pain, sore throat and tinnitus.   Eyes: Negative for blurred vision, double vision, photophobia, pain, discharge and redness.  Respiratory: Negative for cough, hemoptysis, sputum production, shortness of breath, wheezing and stridor.   Cardiovascular: Negative for chest pain, palpitations, orthopnea, claudication, leg swelling and PND.  Gastrointestinal: Negative for abdominal  pain, blood in stool, constipation, diarrhea, heartburn, melena, nausea and vomiting.  Genitourinary: Negative for dysuria, flank pain, frequency, hematuria and urgency.  Musculoskeletal: Negative for back pain, falls, joint pain, myalgias and neck pain.  Skin: Negative for itching and rash.  Neurological: Positive for weakness. Negative for dizziness, tingling, tremors, sensory change, speech change, focal weakness, seizures, loss of consciousness and headaches.  Endo/Heme/Allergies: Negative for environmental allergies and polydipsia. Does not bruise/bleed easily.  Psychiatric/Behavioral: Negative for depression, hallucinations, memory loss, substance abuse and suicidal ideas. The patient is not nervous/anxious and does not have insomnia.     Physical Exam  Constitutional: He is oriented to person, place, and time. He appears well-developed and well-nourished.  HENT:  Head: Normocephalic.  Eyes: Pupils are equal, round, and reactive to light. Conjunctivae are normal.  Neck: Normal range of motion. Neck supple.  Cardiovascular: Normal rate, regular rhythm, normal heart sounds and intact distal pulses.   Respiratory: Effort normal and breath sounds normal.  GI: Soft. Bowel sounds are normal.  Musculoskeletal: Normal range of motion.  Neurological: He is alert and oriented to person, place, and time.  Skin: Skin is warm and dry.  Psychiatric: He has a normal mood and affect. His behavior is normal. Judgment and thought content normal.    Encounter Medications:   Outpatient Encounter Prescriptions as of 02/17/2017  Medication Sig Note  . acetaminophen (TYLENOL) 500 MG tablet Take 1,000 mg by mouth 2 (two) times daily as needed for headache. For pain/headaches   . alprazolam (XANAX) 2 MG tablet Take 1 mg by mouth 3 (three) times daily.    Marland Kitchen amiodarone (PACERONE) 200 MG tablet Take 1 tablet (200 mg total) by  mouth daily.   Marland Kitchen apixaban (ELIQUIS) 5 MG TABS tablet Take 1 tablet (5 mg total) by  mouth 2 (two) times daily.   Marland Kitchen atorvastatin (LIPITOR) 20 MG tablet Take 1 tablet (20 mg total) by mouth daily at 6 PM.   . ertapenem (INVANZ) IVPB Inject 1 g into the vein daily. Indication: Osteomyelitis Last Day of Therapy: 03/28/17 Labs - Once weekly:  CBC/D and BMP, Labs - Every other week:  ESR and CRP   . fenofibrate (TRICOR) 145 MG tablet TAKE ONE TABLET EACH DAY.   . ferrous sulfate 325 (65 FE) MG EC tablet Take 325 mg by mouth daily with breakfast. 02/07/2017: Is not taking while currently prescribed antibiotic  . gabapentin (NEURONTIN) 300 MG capsule Take 300 mg by mouth 2 (two) times daily.    . insulin glargine (LANTUS) 100 UNIT/ML injection Inject 0.5 mLs (50 Units total) into the skin 2 (two) times daily. 75 units in the morning then 60 units at bedtime 02/17/2017: Changed to 50 u bid, 75 units in am and 60 units hs  . insulin lispro (HUMALOG) 100 UNIT/ML injection Inject 0.08 mLs (8 Units total) into the skin 3 (three) times daily with meals.   . INVOKANA 100 MG TABS tablet Take 1 tablet by mouth daily.   Marland Kitchen levothyroxine (SYNTHROID, LEVOTHROID) 25 MCG tablet Take 25 mcg by mouth daily before breakfast.   . meclizine (ANTIVERT) 25 MG tablet Take 25 mg by mouth 3 (three) times daily as needed for dizziness.   . metoprolol succinate (TOPROL-XL) 25 MG 24 hr tablet Take 1 tablet (25 mg total) by mouth daily.   . Omega-3 Fatty Acids (FISH OIL) 1200 MG CAPS Take 1 capsule by mouth daily.    . pantoprazole (PROTONIX) 40 MG tablet Take 1 tablet (40 mg total) by mouth 2 (two) times daily.   . silver sulfADIAZINE (SILVADENE) 1 % cream Apply 1 application topically daily.   Marland Kitchen torsemide (DEMADEX) 10 MG tablet Take 1 tablet (10 mg total) by mouth daily.    No facility-administered encounter medications on file as of 02/17/2017.     Functional Status:   In your present state of health, do you have any difficulty performing the following activities: 02/07/2017 02/06/2017  Hearing? N Y  Vision? N N   Difficulty concentrating or making decisions? N Y  Walking or climbing stairs? N Y  Dressing or bathing? N N  Doing errands, shopping? N Y  Conservation officer, nature and eating ? - N  Using the Toilet? - N  In the past six months, have you accidently leaked urine? - N  Do you have problems with loss of bowel control? - N  Managing your Medications? - N  Managing your Finances? - Y  Housekeeping or managing your Housekeeping? - Y  Some recent data might be hidden    Fall/Depression Screening:    Fall Risk  02/06/2017 12/28/2015 11/02/2015  Falls in the past year? No Yes Yes  Number falls in past yr: - 2 or more 1  Injury with Fall? - No No  Risk for fall due to : Impaired balance/gait;Impaired mobility;Impaired vision Impaired balance/gait -  Risk for fall due to (comments): - - -  Follow up - Falls prevention discussed -   PHQ 2/9 Scores 02/06/2017 02/06/2017 11/25/2015 11/02/2015 09/30/2015 09/21/2015 08/25/2015  PHQ - 2 Score 0 0 0 '2 2 2 ' 0  PHQ- 9 Score - - - '7 7 7 ' -    Assessment:  Rickey Mickelson presents in his recliner at home with his tv at a normal level He has 3 sisters visiting him today since his recent discharge from the hospital. The home health nurse is leaving as [facility] CM is arriving.  His sisters are awaiting to take him out to eat late lunch Rickey Allsup is talkative and joking today with his sisters who are visiting from out town His dressing to his left foot was changed by home health nurse prior to [place] arrival but Rickey Woodmansee states it looks much better   Started iv home Invanz. Reviewed his medications including lantus, stop Augmentin, bactrim plus his follow up appointments per avs sheet with him and his sisters Demonstrated with teach back method how to draw up his sliding scale insulin   Rickey Hyslop is to follow up with marcus  duda in 1  week and has transportation to his appointment He states he will make an appointment himself to see Dr Laurence Ferrari He is scheduled to see Dr Gerarda Fraction  at 1115 on Tuesday 7/10 plus Dr Michel Bickers in 4 weeks on February 27 2017 for IR services  Plan:  Follow up with Rickey Archey in 3 weeks    Joelene Millin L. Lavina Hamman, RN, BSN, [place] [facility] [phone]

## 2017-02-20 DIAGNOSIS — E1159 Type 2 diabetes mellitus with other circulatory complications: Secondary | ICD-10-CM | POA: Diagnosis not present

## 2017-02-20 DIAGNOSIS — L03116 Cellulitis of left lower limb: Secondary | ICD-10-CM | POA: Diagnosis not present

## 2017-02-20 DIAGNOSIS — E782 Mixed hyperlipidemia: Secondary | ICD-10-CM | POA: Diagnosis not present

## 2017-02-20 DIAGNOSIS — E1165 Type 2 diabetes mellitus with hyperglycemia: Secondary | ICD-10-CM | POA: Diagnosis not present

## 2017-02-20 DIAGNOSIS — Z5181 Encounter for therapeutic drug level monitoring: Secondary | ICD-10-CM | POA: Diagnosis not present

## 2017-02-21 ENCOUNTER — Ambulatory Visit (HOSPITAL_COMMUNITY): Payer: Medicare Other

## 2017-02-21 DIAGNOSIS — G894 Chronic pain syndrome: Secondary | ICD-10-CM | POA: Diagnosis not present

## 2017-02-21 DIAGNOSIS — I739 Peripheral vascular disease, unspecified: Secondary | ICD-10-CM | POA: Diagnosis not present

## 2017-02-23 ENCOUNTER — Ambulatory Visit (HOSPITAL_COMMUNITY): Payer: Medicare Other | Admitting: Physical Therapy

## 2017-02-24 ENCOUNTER — Other Ambulatory Visit: Payer: Self-pay | Admitting: Physician Assistant

## 2017-02-24 ENCOUNTER — Other Ambulatory Visit: Payer: Self-pay | Admitting: Student

## 2017-02-24 ENCOUNTER — Other Ambulatory Visit: Payer: Self-pay | Admitting: Radiology

## 2017-02-27 ENCOUNTER — Other Ambulatory Visit (HOSPITAL_COMMUNITY): Payer: Self-pay | Admitting: Interventional Radiology

## 2017-02-27 ENCOUNTER — Encounter (HOSPITAL_COMMUNITY): Payer: Self-pay | Admitting: Interventional Radiology

## 2017-02-27 ENCOUNTER — Ambulatory Visit (HOSPITAL_COMMUNITY)
Admission: RE | Admit: 2017-02-27 | Discharge: 2017-02-27 | Disposition: A | Discharge status: Home / Self Care | Payer: Medicare Other | Source: Ambulatory Visit | Attending: Interventional Radiology | Admitting: Interventional Radiology

## 2017-02-27 DIAGNOSIS — Z7901 Long term (current) use of anticoagulants: Secondary | ICD-10-CM | POA: Insufficient documentation

## 2017-02-27 DIAGNOSIS — Z8249 Family history of ischemic heart disease and other diseases of the circulatory system: Secondary | ICD-10-CM | POA: Diagnosis not present

## 2017-02-27 DIAGNOSIS — I70202 Unspecified atherosclerosis of native arteries of extremities, left leg: Secondary | ICD-10-CM | POA: Diagnosis not present

## 2017-02-27 DIAGNOSIS — I739 Peripheral vascular disease, unspecified: Secondary | ICD-10-CM

## 2017-02-27 DIAGNOSIS — E079 Disorder of thyroid, unspecified: Secondary | ICD-10-CM | POA: Insufficient documentation

## 2017-02-27 DIAGNOSIS — I7092 Chronic total occlusion of artery of the extremities: Secondary | ICD-10-CM | POA: Insufficient documentation

## 2017-02-27 DIAGNOSIS — I2510 Atherosclerotic heart disease of native coronary artery without angina pectoris: Secondary | ICD-10-CM | POA: Diagnosis not present

## 2017-02-27 DIAGNOSIS — E785 Hyperlipidemia, unspecified: Secondary | ICD-10-CM | POA: Diagnosis not present

## 2017-02-27 DIAGNOSIS — M869 Osteomyelitis, unspecified: Secondary | ICD-10-CM | POA: Insufficient documentation

## 2017-02-27 DIAGNOSIS — N189 Chronic kidney disease, unspecified: Secondary | ICD-10-CM | POA: Insufficient documentation

## 2017-02-27 DIAGNOSIS — Z87891 Personal history of nicotine dependence: Secondary | ICD-10-CM | POA: Diagnosis not present

## 2017-02-27 DIAGNOSIS — Z952 Presence of prosthetic heart valve: Secondary | ICD-10-CM | POA: Diagnosis not present

## 2017-02-27 DIAGNOSIS — K219 Gastro-esophageal reflux disease without esophagitis: Secondary | ICD-10-CM | POA: Insufficient documentation

## 2017-02-27 DIAGNOSIS — E1122 Type 2 diabetes mellitus with diabetic chronic kidney disease: Secondary | ICD-10-CM | POA: Insufficient documentation

## 2017-02-27 DIAGNOSIS — I129 Hypertensive chronic kidney disease with stage 1 through stage 4 chronic kidney disease, or unspecified chronic kidney disease: Secondary | ICD-10-CM | POA: Insufficient documentation

## 2017-02-27 DIAGNOSIS — E1151 Type 2 diabetes mellitus with diabetic peripheral angiopathy without gangrene: Secondary | ICD-10-CM | POA: Insufficient documentation

## 2017-02-27 DIAGNOSIS — Z794 Long term (current) use of insulin: Secondary | ICD-10-CM | POA: Diagnosis not present

## 2017-02-27 DIAGNOSIS — Z951 Presence of aortocoronary bypass graft: Secondary | ICD-10-CM | POA: Diagnosis not present

## 2017-02-27 DIAGNOSIS — I69351 Hemiplegia and hemiparesis following cerebral infarction affecting right dominant side: Secondary | ICD-10-CM | POA: Insufficient documentation

## 2017-02-27 DIAGNOSIS — I771 Stricture of artery: Secondary | ICD-10-CM | POA: Diagnosis not present

## 2017-02-27 HISTORY — PX: IR ANGIOGRAM EXTREMITY LEFT: IMG651

## 2017-02-27 HISTORY — PX: IR US GUIDE VASC ACCESS LEFT: IMG2389

## 2017-02-27 LAB — PROTIME-INR
INR: 1.08
Prothrombin Time: 14.1 seconds (ref 11.4–15.2)

## 2017-02-27 LAB — BASIC METABOLIC PANEL
Anion gap: 8 (ref 5–15)
BUN: 17 mg/dL (ref 6–20)
CALCIUM: 9.4 mg/dL (ref 8.9–10.3)
CO2: 27 mmol/L (ref 22–32)
CREATININE: 1.53 mg/dL — AB (ref 0.61–1.24)
Chloride: 104 mmol/L (ref 101–111)
GFR calc non Af Amer: 45 mL/min — ABNORMAL LOW (ref >60)
GFR, EST AFRICAN AMERICAN: 52 mL/min — AB (ref >60)
Glucose, Bld: 150 mg/dL — ABNORMAL HIGH (ref 65–99)
Potassium: 4.2 mmol/L (ref 3.5–5.1)
Sodium: 139 mmol/L (ref 135–145)

## 2017-02-27 LAB — CBC
HCT: 37.6 % — ABNORMAL LOW (ref 39.0–52.0)
Hemoglobin: 12.2 g/dL — ABNORMAL LOW (ref 13.0–17.0)
MCH: 27.9 pg (ref 26.0–34.0)
MCHC: 32.4 g/dL (ref 30.0–36.0)
MCV: 85.8 fL (ref 78.0–100.0)
PLATELETS: 238 10*3/uL (ref 150–400)
RBC: 4.38 MIL/uL (ref 4.22–5.81)
RDW: 13.4 % (ref 11.5–15.5)
WBC: 8.6 10*3/uL (ref 4.0–10.5)

## 2017-02-27 LAB — POCT ACTIVATED CLOTTING TIME: Activated Clotting Time: 186 seconds

## 2017-02-27 LAB — APTT: aPTT: 34 seconds (ref 24–36)

## 2017-02-27 LAB — GLUCOSE, CAPILLARY: Glucose-Capillary: 163 mg/dL — ABNORMAL HIGH (ref 65–99)

## 2017-02-27 MED ORDER — FENTANYL CITRATE (PF) 100 MCG/2ML IJ SOLN
INTRAMUSCULAR | Status: AC
  Filled 2017-02-27: qty 6

## 2017-02-27 MED ORDER — NITROGLYCERIN 1 MG/10 ML FOR IR/CATH LAB
INTRA_ARTERIAL | Status: AC
  Filled 2017-02-27: qty 20

## 2017-02-27 MED ORDER — FENTANYL CITRATE (PF) 100 MCG/2ML IJ SOLN
INTRAMUSCULAR | Status: AC
  Filled 2017-02-27: qty 8

## 2017-02-27 MED ORDER — MIDAZOLAM HCL 2 MG/2ML IJ SOLN
INTRAMUSCULAR | Status: AC
  Filled 2017-02-27: qty 8

## 2017-02-27 MED ORDER — LIDOCAINE HCL 1 % IJ SOLN
INTRAMUSCULAR | Status: DC | PRN
  Administered 2017-02-27: 15 mL

## 2017-02-27 MED ORDER — MIDAZOLAM HCL 2 MG/2ML IJ SOLN
INTRAMUSCULAR | Status: AC
  Filled 2017-02-27: qty 2

## 2017-02-27 MED ORDER — LIDOCAINE HCL (PF) 1 % IJ SOLN
INTRAMUSCULAR | Status: AC
  Filled 2017-02-27: qty 30

## 2017-02-27 MED ORDER — IODIXANOL 320 MG/ML IV SOLN
100.0000 mL | Freq: Once | INTRAVENOUS | Status: AC | PRN
  Administered 2017-02-27: 65 mL via INTRA_ARTERIAL

## 2017-02-27 MED ORDER — SODIUM CHLORIDE 0.9 % IV SOLN
INTRAVENOUS | Status: AC | PRN
  Administered 2017-02-27: 250 mL via INTRAVENOUS

## 2017-02-27 MED ORDER — HEPARIN SODIUM (PORCINE) 1000 UNIT/ML IJ SOLN
INTRAMUSCULAR | Status: AC
Start: 2017-02-27 — End: 2017-02-27
  Filled 2017-02-27: qty 2

## 2017-02-27 MED ORDER — MIDAZOLAM HCL 2 MG/2ML IJ SOLN
INTRAMUSCULAR | Status: AC
  Filled 2017-02-27: qty 6

## 2017-02-27 MED ORDER — HEPARIN SOD (PORK) LOCK FLUSH 100 UNIT/ML IV SOLN
INTRAVENOUS | Status: AC
  Filled 2017-02-27: qty 5

## 2017-02-27 MED ORDER — FENTANYL CITRATE (PF) 100 MCG/2ML IJ SOLN
INTRAMUSCULAR | Status: AC
  Filled 2017-02-27: qty 2

## 2017-02-27 MED ORDER — HEPARIN SODIUM (PORCINE) 1000 UNIT/ML IJ SOLN
INTRAMUSCULAR | Status: AC | PRN
  Administered 2017-02-27: 5000 [IU] via INTRAVENOUS
  Administered 2017-02-27: 4000 [IU] via INTRAVENOUS

## 2017-02-27 MED ORDER — SODIUM CHLORIDE 0.9 % IV SOLN: INTRAVENOUS | Status: DC

## 2017-02-27 MED ORDER — IOHEXOL 300 MG/ML  SOLN: 100.0000 mL | Freq: Once | INTRAMUSCULAR | Status: DC | PRN

## 2017-02-27 MED ORDER — SODIUM CHLORIDE 0.9 % IJ SOLN
INTRAVENOUS | Status: AC | PRN
  Administered 2017-02-27: 200 ug via INTRA_ARTERIAL

## 2017-02-27 MED ORDER — FENTANYL CITRATE (PF) 100 MCG/2ML IJ SOLN
INTRAMUSCULAR | Status: AC | PRN
  Administered 2017-02-27 (×5): 50 ug via INTRAVENOUS
  Administered 2017-02-27: 100 ug via INTRAVENOUS
  Administered 2017-02-27 (×2): 50 ug via INTRAVENOUS

## 2017-02-27 MED ORDER — MIDAZOLAM HCL 2 MG/2ML IJ SOLN
INTRAMUSCULAR | Status: AC | PRN
  Administered 2017-02-27 (×9): 1 mg via INTRAVENOUS

## 2017-02-27 NOTE — Sedation Documentation (Signed)
Patient is resting comfortably. 

## 2017-02-27 NOTE — H&P (Signed)
Chief Complaint: Patient was seen in consultation today for peripheral arterial disease  Referring Physician(s):  Dr. Patrecia Pour  Supervising Physician: Jacqulynn Cadet  Patient Status: [facility] - Out-pt  History of Present Illness: [name] is a 71 y.o. male with past medical history CKD, DM2, GERD, HTN, stroke, and peripheral arterial disease who is known to radiology service from prior interventions of the left lower extremity.   Patient is s/p intervention with Dr. Laurence Ferrari 02/10/17 at which time angiogram demonstrated: 1. Left lower extremity diagnostic arteriography demonstrates a diffusely diseased superficial femoral artery with multiple areas of moderate and high-grade focal stenosis secondary to heavily calcified atherosclerotic plaque. Additionally, there is a focal high-grade stenosis in the P2 segment of the popliteal artery. 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 2. Successful orbital atherectomy and drug coated balloon angioplasty of the popliteal artery to 5 mm, in the superficial femoral artery to 6 mm.  Patient presents today for ongoing reconstruction of the occluded tibial arteries.   He states he has been in his usual state of health.  He does feel his left foot has continued to heal.  It has improved in warmth and color per his report.  He also reports he has more feeling/sensation in the foot.  He has been NPO.  He last took Elliquis on Friday.   Past Medical History:  Diagnosis Date  . At risk for sudden cardiac death 06-08-13  . Cataract   . Chronic anticoagulation 05/2013   started  . Chronic kidney disease    Patient reports he is seeing Kidney doctor in September  . Coronary artery disease   . Diabetes mellitus    type 2   . Dyslipidemia   . GERD (gastroesophageal reflux disease)   . History of valve replacement  2010   [facility] Ease bioprosthetic 8m  . Hypertension   . PAF (paroxysmal atrial fibrillation) ([facility] 05/2013   converted with amiodarone to SR  . S/P CABG x 3 2010  . S/P cardiac catheterization, Rt & Lt heart cath 06/05/2013 110-25-14 . Stroke ([facility] 2003   R-sided weakness & upper extremity swelling   . Thyroid disease     Past Surgical History:  Procedure Laterality Date  . [facility]  . CARDIAC CATHETERIZATION  06/05/2013   native 3 vessel disease; patent LIMA to LAD, SVG to OM and SVG to PDA; Elevated right and left heart filling pressures, with predominantly pulmonary venous hypertension, normal PVR  . COLONOSCOPY  2007   Dr. MaAviva Signsinternal hemorrhoids  . COLONOSCOPY N/A 06/16/2016   Procedure: COLONOSCOPY;  Surgeon: NaRogene HoustonMD;  Location: [facility];  Service: Endoscopy;  Laterality: N/A;  1:45  . CORONARY ARTERY BYPASS GRAFT  01/26/2009   LIMA to LAD, SVG to circumflex, SVG to PDA  . GRAFT(S) ANGIOGRAM  06/05/2013   Procedure: GRAFT(S) ANCyril Loosen Surgeon: DaLeonie ManMD;  [facility];  Service: Cardiovascular;;  . IR ANGIOGRAM EXTREMITY LEFT  02/10/2017  . IR FEM POP ART ATHERECT INC PTA MOD SED  02/10/2017  . IR GENERIC HISTORICAL  05/12/2016   IR RADIOLOGIST EVAL & MGMT 05/12/2016 HeJacqulynn CadetMD [facility] INTERV RAD  . IR RADIOLOGIST EVAL & MGMT  01/04/2017  . IR [place] VASC ACCESS RIGHT  02/10/2017  .  LEFT AND RIGHT HEART CATHETERIZATION WITH CORONARY ANGIOGRAM N/A 06/05/2013   Procedure: LEFT AND RIGHT HEART CATHETERIZATION WITH CORONARY ANGIOGRAM;  Surgeon: Leonie Man, MD;  [facility];  Service: Cardiovascular;  Laterality: N/A;  . TRANSTHORACIC ECHOCARDIOGRAM  09/2011   grade 1 diastolic dysfunction; increasing valve gradient; calcified MV annulus   . TRANSTHORACIC ECHOCARDIOGRAM  06/03/2013   EF 25-30%, mod conc. hypertrophy, grade 3  diastolic dysfunction; LA mod dilated; calcified MV annulus; transaortic gradients are normal for the bioprosthetic valve; inf vena cava dilated (elevated CVP) - LifeVest    Allergies: Patient has no known allergies.  Medications: Prior to Admission medications   Medication Sig Start Date End Date Taking? Authorizing Provider  alprazolam Duanne Moron) 2 MG tablet Take 1 mg by mouth 3 (three) times daily.    Yes [provider]  amiodarone (PACERONE) 200 MG tablet Take 1 tablet (200 mg total) by mouth daily. 10/14/16  Yes Hilty, Nadean Corwin, MD  atorvastatin (LIPITOR) 20 MG tablet Take 1 tablet (20 mg total) by mouth daily at 6 PM. 06/18/16  Yes Rehman, Mechele Dawley, MD  ertapenem (INVANZ) IVPB Inject 1 g into the vein daily. Indication: Osteomyelitis Last Day of Therapy: 03/28/17 Labs - Once weekly:  CBC/D and BMP, Labs - Every other week:  ESR and CRP 02/14/17  Yes Patrecia Pour, Christean Grief, MD  fenofibrate (TRICOR) 145 MG tablet TAKE ONE TABLET EACH DAY. 11/01/16  Yes Hilty, Nadean Corwin, MD  gabapentin (NEURONTIN) 300 MG capsule Take 300 mg by mouth 2 (two) times daily.    Yes [provider]  insulin glargine (LANTUS) 100 UNIT/ML injection Inject 0.5 mLs (50 Units total) into the skin 2 (two) times daily. 75 units in the morning then 60 units at bedtime 02/14/17  Yes Patrecia Pour, Christean Grief, MD  insulin lispro (HUMALOG) 100 UNIT/ML injection Inject 0.08 mLs (8 Units total) into the skin 3 (three) times daily with meals. 02/14/17 03/16/17 Yes Patrecia Pour, Christean Grief, MD  INVOKANA 100 MG TABS tablet Take 1 tablet by mouth daily. 12/09/16  Yes [provider]  levothyroxine (SYNTHROID, LEVOTHROID) 25 MCG tablet Take 25 mcg by mouth daily before breakfast.   Yes [provider]  metoprolol succinate (TOPROL-XL) 25 MG 24 hr tablet Take 1 tablet (25 mg total) by mouth daily. 10/14/16  Yes Hilty, Nadean Corwin, MD  pantoprazole (PROTONIX) 40 MG tablet Take 1 tablet (40 mg total) by mouth 2 (two) times daily.  10/30/14  Yes Hilty, Nadean Corwin, MD  torsemide (DEMADEX) 10 MG tablet Take 1 tablet (10 mg total) by mouth daily. 10/14/16  Yes Hilty, Nadean Corwin, MD  acetaminophen (TYLENOL) 500 MG tablet Take 1,000 mg by mouth 2 (two) times daily as needed for headache. For pain/headaches    [provider]  apixaban (ELIQUIS) 5 MG TABS tablet Take 1 tablet (5 mg total) by mouth 2 (two) times daily. 10/14/16   Hilty, Nadean Corwin, MD  ferrous sulfate 325 (65 FE) MG EC tablet Take 325 mg by mouth daily with breakfast.    [provider]  meclizine (ANTIVERT) 25 MG tablet Take 25 mg by mouth 3 (three) times daily as needed for dizziness.    [provider]  Omega-3 Fatty Acids (FISH OIL) 1200 MG CAPS Take 1 capsule by mouth daily.     [provider]  silver sulfADIAZINE (SILVADENE) 1 % cream Apply 1 application topically daily.    [provider]     Family History  Problem Relation Age of Onset  . Heart attack Mother   . Liver disease Brother   . Diabetes Sister        x4    Social History   Social History  . Marital status: Divorced    Spouse name: N/A  . Number of children: 4  . Years of education: N/A   Occupational History  . 4 Retired   Social History Main Topics  . Smoking status: Former Smoker    Types: Cigarettes    Quit date: 08/15/2001  . Smokeless tobacco: Former Systems developer    Types: Chew     Comment: Quit in 2003  . Alcohol use No  . Drug use: No  . Sexual activity: Not Currently   Other Topics Concern  . Not on file   Social History Narrative  . No narrative on file    Review of Systems  Constitutional: Negative for fatigue and fever.  Respiratory: Negative for cough and shortness of breath.   Psychiatric/Behavioral: Negative for behavioral problems and confusion.    Vital Signs: BP 128/66   Pulse 70   Temp 97.7 F (36.5 C) (Oral)   Resp 20   Ht '5\' 10"'  (1.778 m)   Wt 232 lb (105.2 kg)   SpO2 95%   BMI 33.29 kg/m   Physical Exam   Constitutional: He is oriented to person, place, and time. He appears well-developed.  Cardiovascular: Normal rate, regular rhythm and normal heart sounds.   Pulmonary/Chest: Effort normal and breath sounds normal. No respiratory distress.  Neurological: He is alert and oriented to person, place, and time.  Skin: Skin is warm and dry.  Left foot with swelling/edema.  Distal portion as well as toes with erythema. Foot is warm. Faint pulses intact. Healing wounds present.   Psychiatric: He has a normal mood and affect. His behavior is normal. Judgment and thought content normal.  Vitals reviewed.   Mallampati Score:  MD Evaluation Airway: WNL Heart: WNL Abdomen: WNL Chest/ Lungs: WNL ASA  Classification: 3 Mallampati/Airway Score: Two  Imaging: Ir Angiogram Extremity Left  Result Date: 02/10/2017 INDICATION: 71 year old male with Rutherford category 5/6 critical limb ischemia involving the left forefoot. He presents for arteriogram and endovascular optimization of blood flow to the wound. EXAM: LEFT EXTREMITY ARTERIOGRAPHY; IR ULTRASOUND GUIDANCE VASC ACCESS RIGHT; IR FEM POP ART ATHERECT INC PTA MEDICATIONS: Patient is currently an inpatient receiving intravenous antibiotics. No additional antibiotics were administered. 8000 units heparin and 1100 mcg nitroglycerin were administered. ANESTHESIA/SEDATION: Moderate (conscious) sedation was employed during this procedure. A total of Versed 3.5 mg and Fentanyl 175 mcg was administered intravenously. Moderate Sedation Time: 125 minutes. The patient's level of consciousness and vital signs were monitored continuously by radiology nursing throughout the procedure under my direct supervision. CONTRAST:  28m VISIPAQUE IODIXANOL 320 MG/ML IV SOLN, 253mVISIPAQUE IODIXANOL 320 MG/ML IV SOLN FLUOROSCOPY TIME:  Fluoroscopy Time: 26 minutes 36 seconds (219 mGy). COMPLICATIONS: None immediate. PROCEDURE: Informed consent was obtained from the patient  following explanation of the procedure, risks, benefits and alternatives. The patient understands, agrees and consents for the procedure. All questions were addressed. A time out was performed prior to the initiation of the procedure. Maximal barrier sterile technique utilized including caps, mask, sterile gowns, sterile gloves, large sterile drape, hand hygiene, and Betadine prep. The right common femoral artery was interrogated with ultrasound and found to be widely patent. An image was obtained and stored for the medical record. Local anesthesia was attained by  infiltration with 1% lidocaine. A small dermatotomy was made. Under real-time sonographic guidance, the vessel was punctured with a 21 gauge micropuncture needle. Using standard technique, the initial micro needle was exchanged over a 0.018 micro wire for a transitional 4 [place] micro sheath. The micro sheath was then exchanged over a 0.035 wire for a 5 French vascular sheath. An Omni flush catheter was advanced to the aortic bifurcation and pelvic arteriography was performed. Both internal iliac arteries are widely patent. The common and external iliac arteries are widely patent. The Omni flush catheter was then advanced up in over the aortic bifurcation and into the left common femoral artery. A multi station left lower extremity arteriogram was then performed. There is a multifocal calcified atherosclerotic plaque throughout the superficial femoral artery. There are multiple regions of a moderate and high-grade focal stenosis. The most critical stenosis is in the region of of the mid thigh were the vessel is approximately 90% stenosis. The distal most superficial femoral artery in the P1 segment of the popliteal artery are relatively spared from disease. However, there is a focal high-grade stenosis in the P2 segment of the popliteal artery just above the joint space. Significant runoff disease. Both the anterior tibial and posterior tibial arteries are  occluded. The peroneal artery provides the dominant runoff to the foot and reconstitutes both the distal posterior tibial artery and the dorsalis pedis artery. The plantar loop is intact. A CX I catheter was advanced over a Bentson wire into the distal popliteal artery. The Bentson wire was then exchanged for a by per wire. Multifocal orbital atherectomy was performed throughout the superficial femoral and popliteal artery easy using a 2.0 classic crown for the CS I Diamond back device. Following atherectomy, multi station drug-eluting balloon angioplasty was performed. The P2 segment popliteal artery was treated using a 5 x 120 IN.PACT balloon. The remaining superficial femoral artery was treated with minimally overlapping stations using two 6 x 150 IN.PACT balloons. There was a focal stenosis in the mid superficial femoral artery at the site of the most severe stenosis which could not be completely opened using the IN.PACT balloon. This remained present with a moderate to high-grade residual focal stenosis on follow-up angiography. There are multifocal mild non flow limiting linear dissections. The remaining residual stenosis was then treated using a 5 x 40 mm are mottled balloon. The are mild balloon was successfully expanded to full effacement with rupture of the plaque. The balloon was left inflated for a prolonged period of time. Follow-up arteriography demonstrates improved luminal gain. There is no residual significant stenosis. Lateral foot arteriography was again performed demonstrating no evidence of distal embolic phenomenon. The wires and catheter were removed. Hemostasis was attained with the assistance of a Cordis Exoseal extra arterial vascular plug. The patient tolerated the procedure well. IMPRESSION: 1. Left lower extremity diagnostic arteriography demonstrates a diffusely diseased superficial femoral artery with multiple areas of moderate and high-grade focal stenosis secondary to heavily  calcified atherosclerotic plaque. Additionally, there is a focal high-grade stenosis in the P2 segment of the popliteal artery. 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 2. Successful orbital atherectomy and drug coated balloon angioplasty of the popliteal artery to 5 mm, in the superficial femoral artery to 6 mm. PLAN: While blood flow is significantly improved, patient would likely benefit from reconstruction of the occluded tibial arteries. We will arrange for a second session arteriogram in  the near future to attempt to re- vascularized the posterior tibial, and possibly the anterior tibial arteries. Recommend continued wound care and possible debridement. Patient would like to avoid amputation if at all possible. Signed, Criselda Peaches, MD [facility] Radiology Electronically Signed   By: Jacqulynn Cadet M.D.   On: 02/10/2017 19:26   Mr Foot Left Wo Contrast  Result Date: 02/08/2017 CLINICAL DATA:  Pain and discoloration of the distal left foot. EXAM: MRI OF THE LEFT FOOT WITHOUT CONTRAST TECHNIQUE: Multiplanar, multisequence MR imaging of the left forefoot was performed. No intravenous contrast was administered. COMPARISON:  12/12/2016 FINDINGS: Bones/Joint/Cartilage Soft tissue wound along the plantar aspect of the first MTP joint. 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 7.3 x 20 mm complex fluid collection along the medial aspect of the first MTP joint concerning for an abscess. No acute fracture or dislocation. No other marrow signal abnormality. Normal alignment. No joint effusion. Ligaments Collateral ligaments are intact.  Lisfranc ligament is intact. Muscles and  Tendons Flexor, peroneal and extensor compartment tendons are intact. No significant muscle atrophy. Mild diffuse increased T2 hyperintense signal throughout the muscles likely neurogenic given the patient's history diabetes versus less likely myositis. Soft tissue No other fluid collection or hematoma.  No soft tissue mass. IMPRESSION: Soft tissue wound along the plantar aspect of the first MTP joint. 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 7.3 x 20 mm abscess along the medial aspect of the first MTP joint. Electronically Signed   By: Kathreen Devoid   On: 02/08/2017 08:25   [place] Pta Mod Sed  Result Date: 02/10/2017 INDICATION: 71 year old male with Rutherford category 5/6 critical limb ischemia involving the left forefoot. He presents for arteriogram and endovascular optimization of blood flow to the wound. EXAM: LEFT EXTREMITY ARTERIOGRAPHY; IR ULTRASOUND GUIDANCE VASC ACCESS RIGHT; IR FEM POP ART ATHERECT INC PTA MEDICATIONS: Patient is currently an inpatient receiving intravenous antibiotics. No additional antibiotics were administered. 8000 units heparin and 1100 mcg nitroglycerin were administered. ANESTHESIA/SEDATION: Moderate (conscious) sedation was employed during this procedure. A total of Versed 3.5 mg and Fentanyl 175 mcg was administered intravenously. Moderate Sedation Time: 125 minutes. The patient's level of consciousness and vital signs were monitored continuously by radiology nursing throughout the procedure under my direct supervision. CONTRAST:  9m VISIPAQUE IODIXANOL 320 MG/ML IV SOLN, 290mVISIPAQUE IODIXANOL 320 MG/ML IV SOLN FLUOROSCOPY TIME:  Fluoroscopy Time: 26 minutes 36 seconds (219 mGy). COMPLICATIONS: None immediate. PROCEDURE: Informed consent was obtained from the patient following  explanation of the procedure, risks, benefits and alternatives. The patient understands, agrees and consents for the procedure. All questions were addressed. A time out was performed prior to the initiation of the procedure. Maximal barrier sterile technique utilized including caps, mask, sterile gowns, sterile gloves, large sterile drape, hand hygiene, and Betadine prep. The right common femoral artery was interrogated with ultrasound and found to be widely patent. An image was obtained and stored for the medical record. Local anesthesia was attained by infiltration with 1% lidocaine. A small dermatotomy was made. Under real-time sonographic guidance, the vessel was punctured with a 21 gauge micropuncture needle. Using standard technique,  the initial micro needle was exchanged over a 0.018 micro wire for a transitional 4 [place] micro sheath. The micro sheath was then exchanged over a 0.035 wire for a 5 French vascular sheath. An Omni flush catheter was advanced to the aortic bifurcation and pelvic arteriography was performed. Both internal iliac arteries are widely patent. The common and external iliac arteries are widely patent. The Omni flush catheter was then advanced up in over the aortic bifurcation and into the left common femoral artery. A multi station left lower extremity arteriogram was then performed. There is a multifocal calcified atherosclerotic plaque throughout the superficial femoral artery. There are multiple regions of a moderate and high-grade focal stenosis. The most critical stenosis is in the region of of the mid thigh were the vessel is approximately 90% stenosis. The distal most superficial femoral artery in the P1 segment of the popliteal artery are relatively spared from disease. However, there is a focal high-grade stenosis in the P2 segment of the popliteal artery just above the joint space. Significant runoff disease. Both the anterior tibial and posterior tibial arteries are occluded.  The peroneal artery provides the dominant runoff to the foot and reconstitutes both the distal posterior tibial artery and the dorsalis pedis artery. The plantar loop is intact. A CX I catheter was advanced over a Bentson wire into the distal popliteal artery. The Bentson wire was then exchanged for a by per wire. Multifocal orbital atherectomy was performed throughout the superficial femoral and popliteal artery easy using a 2.0 classic crown for the CS I Diamond back device. Following atherectomy, multi station drug-eluting balloon angioplasty was performed. The P2 segment popliteal artery was treated using a 5 x 120 IN.PACT balloon. The remaining superficial femoral artery was treated with minimally overlapping stations using two 6 x 150 IN.PACT balloons. There was a focal stenosis in the mid superficial femoral artery at the site of the most severe stenosis which could not be completely opened using the IN.PACT balloon. This remained present with a moderate to high-grade residual focal stenosis on follow-up angiography. There are multifocal mild non flow limiting linear dissections. The remaining residual stenosis was then treated using a 5 x 40 mm are mottled balloon. The are mild balloon was successfully expanded to full effacement with rupture of the plaque. The balloon was left inflated for a prolonged period of time. Follow-up arteriography demonstrates improved luminal gain. There is no residual significant stenosis. Lateral foot arteriography was again performed demonstrating no evidence of distal embolic phenomenon. The wires and catheter were removed. Hemostasis was attained with the assistance of a Cordis Exoseal extra arterial vascular plug. The patient tolerated the procedure well. IMPRESSION: 1. Left lower extremity diagnostic arteriography demonstrates a diffusely diseased superficial femoral artery with multiple areas of moderate and high-grade focal stenosis secondary to heavily calcified  atherosclerotic plaque. Additionally, there is a focal high-grade stenosis in the P2 segment of the popliteal artery. 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 2. Successful orbital atherectomy and drug coated balloon angioplasty of the popliteal artery to 5 mm, in the superficial femoral artery to 6 mm. PLAN: While blood flow is significantly improved, patient would likely benefit from reconstruction of the occluded tibial arteries. We will arrange for a second session arteriogram in the near future to attempt to re- vascularized the posterior tibial, and possibly the anterior tibial arteries. Recommend continued wound care and possible debridement. Patient would  like to avoid amputation if at all possible. Signed, Criselda Peaches, MD [facility] Radiology Electronically Signed   By: Jacqulynn Cadet M.D.   On: 02/10/2017 19:26   Ir US Guide Vasc Access Right  Result Date: 02/10/2017 INDICATION: 71 year old male with Rutherford category 5/6 critical limb ischemia involving the left forefoot. He presents for arteriogram and endovascular optimization of blood flow to the wound. EXAM: LEFT EXTREMITY ARTERIOGRAPHY; IR ULTRASOUND GUIDANCE VASC ACCESS RIGHT; IR FEM POP ART ATHERECT INC PTA MEDICATIONS: Patient is currently an inpatient receiving intravenous antibiotics. No additional antibiotics were administered. 8000 units heparin and 1100 mcg nitroglycerin were administered. ANESTHESIA/SEDATION: Moderate (conscious) sedation was employed during this procedure. A total of Versed 3.5 mg and Fentanyl 175 mcg was administered intravenously. Moderate Sedation Time: 125 minutes. The patient's level of consciousness and vital signs were monitored continuously by radiology nursing throughout the procedure under my direct supervision. CONTRAST:  5m  VISIPAQUE IODIXANOL 320 MG/ML IV SOLN, 268mVISIPAQUE IODIXANOL 320 MG/ML IV SOLN FLUOROSCOPY TIME:  Fluoroscopy Time: 26 minutes 36 seconds (219 mGy). COMPLICATIONS: None immediate. PROCEDURE: Informed consent was obtained from the patient following explanation of the procedure, risks, benefits and alternatives. The patient understands, agrees and consents for the procedure. All questions were addressed. A time out was performed prior to the initiation of the procedure. Maximal barrier sterile technique utilized including caps, mask, sterile gowns, sterile gloves, large sterile drape, hand hygiene, and Betadine prep. The right common femoral artery was interrogated with ultrasound and found to be widely patent. An image was obtained and stored for the medical record. Local anesthesia was attained by infiltration with 1% lidocaine. A small dermatotomy was made. Under real-time sonographic guidance, the vessel was punctured with a 21 gauge micropuncture needle. Using standard technique, the initial micro needle was exchanged over a 0.018 micro wire for a transitional 4 FrPakistanicro sheath. The micro sheath was then exchanged over a 0.035 wire for a 5 French vascular sheath. An Omni flush catheter was advanced to the aortic bifurcation and pelvic arteriography was performed. Both internal iliac arteries are widely patent. The common and external iliac arteries are widely patent. The Omni flush catheter was then advanced up in over the aortic bifurcation and into the left common femoral artery. A multi station left lower extremity arteriogram was then performed. There is a multifocal calcified atherosclerotic plaque throughout the superficial femoral artery. There are multiple regions of a moderate and high-grade focal stenosis. The most critical stenosis is in the region of of the mid thigh were the vessel is approximately 90% stenosis. The distal most superficial femoral artery in the P1 segment of the popliteal  artery are relatively spared from disease. However, there is a focal high-grade stenosis in the P2 segment of the popliteal artery just above the joint space. Significant runoff disease. Both the anterior tibial and posterior tibial arteries are occluded. The peroneal artery provides the dominant runoff to the foot and reconstitutes both the distal posterior tibial artery and the dorsalis pedis artery. The plantar loop is intact. A CX I catheter was advanced over a Bentson wire into the distal popliteal artery. The Bentson wire was then exchanged for a by per wire. Multifocal orbital atherectomy was performed throughout the superficial femoral and popliteal artery easy using a 2.0 classic crown for the CS I Diamond back device. Following atherectomy, multi station drug-eluting balloon angioplasty was performed. The P2 segment popliteal artery was treated using a 5 x  120 IN.PACT balloon. The remaining superficial femoral artery was treated with minimally overlapping stations using two 6 x 150 IN.PACT balloons. There was a focal stenosis in the mid superficial femoral artery at the site of the most severe stenosis which could not be completely opened using the IN.PACT balloon. This remained present with a moderate to high-grade residual focal stenosis on follow-up angiography. There are multifocal mild non flow limiting linear dissections. The remaining residual stenosis was then treated using a 5 x 40 mm are mottled balloon. The are mild balloon was successfully expanded to full effacement with rupture of the plaque. The balloon was left inflated for a prolonged period of time. Follow-up arteriography demonstrates improved luminal gain. There is no residual significant stenosis. Lateral foot arteriography was again performed demonstrating no evidence of distal embolic phenomenon. The wires and catheter were removed. Hemostasis was attained with the assistance of a Cordis Exoseal extra arterial vascular plug. The  patient tolerated the procedure well. IMPRESSION: 1. Left lower extremity diagnostic arteriography demonstrates a diffusely diseased superficial femoral artery with multiple areas of moderate and high-grade focal stenosis secondary to heavily calcified atherosclerotic plaque. Additionally, there is a focal high-grade stenosis in the P2 segment of the popliteal artery. 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 2. Successful orbital atherectomy and drug coated balloon angioplasty of the popliteal artery to 5 mm, in the superficial femoral artery to 6 mm. PLAN: While blood flow is significantly improved, patient would likely benefit from reconstruction of the occluded tibial arteries. We will arrange for a second session arteriogram in the near future to attempt to re- vascularized the posterior tibial, and possibly the anterior tibial arteries. Recommend continued wound care and possible debridement. Patient would like to avoid amputation if at all possible. Signed, Criselda Peaches, MD [facility] Radiology Electronically Signed   By: Jacqulynn Cadet M.D.   On: 02/10/2017 19:26    Labs:  CBC:  Recent Labs  02/09/17 0541 02/10/17 1111 02/11/17 0421 02/27/17 0629  WBC 6.4 6.5 6.7 8.6  HGB 11.1* 12.6* 11.4* 12.2*  HCT 34.2* 39.8 36.1* 37.6*  PLT 209 243 190 238    COAGS:  Recent Labs  02/10/17 0413 02/27/17 0629  INR 1.08 1.08  APTT  --  34    BMP:  Recent Labs  02/09/17 0541 02/10/17 1111 02/11/17 0421 02/27/17 0629  NA 135 137 135 139  K 4.2 4.7 4.4 4.2  CL 100* 102 102 104  CO2 '28 26 26 27  ' GLUCOSE 230* 198* 294* 150*  BUN '20 17 17 17  ' CALCIUM 8.8* 9.6 8.7* 9.4  CREATININE 1.37* 1.23 1.21 1.53*  GFRNONAA 51* 59* 60* 45*  GFRAA 60* >60 >60 52*    LIVER FUNCTION TESTS:  Recent Labs  04/14/16 1059  12/12/16 1259  BILITOT 0.5 0.4  AST 41* 29  ALT 52* 29  ALKPHOS 39* 48  PROT 6.6 7.0  ALBUMIN 4.3 3.8    TUMOR MARKERS: No results for input(s): AFPTM, CEA, CA199, CHROMGRNA in the last 8760 hours.  Assessment and Plan: Patient with history of peripheral artery disease s/p intervention 02/10/17 presents for ongoing reconstruction of his occluded tibial arteries in the left lower extremity.  Patient has been NPO.  He appropriately held his Elliquis since Friday.  He presents today in his usual state of health.  Discussed plan today with patient and his  niece who will be picking him up from the hospital after intervention today.  He does plan to stay with her tonight.  Risks and benefits discussed with the patient including, but not limited to bleeding, infection, vascular injury or contrast induced renal failure. All of the patient's questions were answered, patient is agreeable to proceed. Consent signed and in chart.  Thank you for this interesting consult.  I greatly enjoyed meeting [name] and look forward to participating in their care.  A copy of this report was sent to the requesting provider on this date.  Electronically Signed: Docia Barrier, PA 02/27/2017, 8:08 AM   I spent a total of    15 Minutes in face to face in clinical consultation, greater than 50% of which was counseling/coordinating care for peripheral arterial disease.

## 2017-02-27 NOTE — Sedation Documentation (Signed)
Pt became nauseated post Nitroglycerin

## 2017-02-27 NOTE — Discharge Instructions (Signed)
[facility]  Discharge Instructions  Lower Extremity Angiogram; Angioplasty/Stenting  Please refer to the following instructions for your post-procedure care. Your surgeon or physician assistant will discuss any changes with you.  Activity  Avoid lifting more than 8 pounds (1 gallons of milk) for 72 hours (3 days) after your procedure. You may walk as much as you can tolerate. It's OK to drive after 72 hours.  Bathing/Showering  You may shower the day after your procedure. If you have a bandage, you may remove it at 24- 48 hours. Clean your incision site with mild soap and water. Pat the area dry with a clean towel.  Diet  Resume your pre-procedure diet. There are no special food restrictions following this procedure. All patients with peripheral vascular disease should follow a low fat/low cholesterol diet. In order to heal from your surgery, it is CRITICAL to get adequate nutrition. Your body requires vitamins, minerals, and protein. Vegetables are the best source of vitamins and minerals. Vegetables also provide the perfect balance of protein. Processed food has little nutritional value, so try to avoid this.  Medications  Resume taking all of your medications unless your doctor tells you not to. If your incision is causing pain, you may take over-the-counter pain relievers such as acetaminophen (Tylenol)  Follow Up  Follow up will be arranged at the time of your procedure. You may have an office visit scheduled or may be scheduled for surgery. Ask your surgeon if you have any questions.  Please call us immediately for any of the following conditions: Severe or worsening pain your legs or feet at rest or with walking. Increased pain, redness, drainage at your groin puncture site. Fever of 101 degrees or higher. If you have any mild or slow bleeding from your puncture site: lie down, apply firm constant pressure over the area with a piece of  gauze or a clean wash cloth for 30 minutes- no peeking!, call 911 right away if you are still bleeding after 30 minutes, or if the bleeding is heavy and unmanageable.  Reduce your risk factors of vascular disease:  Stop smoking. If you would like help call [facility] at 1-800-QUIT-NOW 9080865697) or [place] at ([phone]. Manage your cholesterol Maintain a desired weight Control your diabetes Keep your blood pressure down  If you have any questions, please call the office at [phone] Moderate Conscious Sedation, Adult, Care After These instructions provide you with information about caring for yourself after your procedure. Your health care provider may also give you more specific instructions. Your treatment has been planned according to current medical practices, but problems sometimes occur. Call your health care provider if you have any problems or questions after your procedure. What can I expect after the procedure? After your procedure, it is common:  To feel sleepy for several hours.  To feel clumsy and have poor balance for several hours.  To have poor judgment for several hours.  To vomit if you eat too soon.  Follow these instructions at home: For at least 24 hours after the procedure:   Do not: ? Participate in activities where you could fall or become injured. ? Drive. ? Use heavy machinery. ? Drink alcohol. ? Take sleeping pills or medicines that cause drowsiness. ? Make important decisions or sign legal documents. ? Take care of children on your own.  Rest. Eating and drinking  Follow the diet recommended by your health care provider.  If you vomit: ? Drink  water, juice, or soup when you can drink without vomiting. ? Make sure you have little or no nausea before eating solid foods. General instructions  Have a responsible adult stay with you until you are awake and alert.  Take over-the-counter and prescription medicines only as told by  your health care provider.  If you smoke, do not smoke without supervision.  Keep all follow-up visits as told by your health care provider. This is important. Contact a health care provider if:  You keep feeling nauseous or you keep vomiting.  You feel light-headed.  You develop a rash.  You have a fever. Get help right away if:  You have trouble breathing. This information is not intended to replace advice given to you by your health care provider. Make sure you discuss any questions you have with your health care provider. Document Released: 05/22/2013 Document Revised: 01/04/2016 Document Reviewed: 11/21/2015 Elsevier Interactive Patient Education  [employer].

## 2017-02-27 NOTE — Procedures (Signed)
Interventional Radiology Procedure Note  Procedure: Left lower extremity angiogram, attempted recanalization of the ATA and PTA.  Vascular access: Left PTA, 76F and left CFA, antegrade 11F --> ExoSeal  Complications:  None  Estimated Blood Loss: <25 mL  Recommendations: - Continue plavix - Resume Eliquis - Continue abx  Signed,  Criselda Peaches, MD

## 2017-02-28 ENCOUNTER — Ambulatory Visit (HOSPITAL_COMMUNITY): Payer: Medicare Other | Admitting: Physical Therapy

## 2017-02-28 ENCOUNTER — Other Ambulatory Visit (HOSPITAL_COMMUNITY): Payer: Self-pay | Admitting: Interventional Radiology

## 2017-02-28 DIAGNOSIS — I739 Peripheral vascular disease, unspecified: Secondary | ICD-10-CM

## 2017-02-28 DIAGNOSIS — Z5181 Encounter for therapeutic drug level monitoring: Secondary | ICD-10-CM | POA: Diagnosis not present

## 2017-02-28 DIAGNOSIS — L03116 Cellulitis of left lower limb: Secondary | ICD-10-CM

## 2017-03-02 ENCOUNTER — Ambulatory Visit (HOSPITAL_COMMUNITY): Payer: Medicare Other | Admitting: Physical Therapy

## 2017-03-02 DIAGNOSIS — M869 Osteomyelitis, unspecified: Secondary | ICD-10-CM | POA: Diagnosis not present

## 2017-03-02 DIAGNOSIS — R0602 Shortness of breath: Secondary | ICD-10-CM | POA: Diagnosis not present

## 2017-03-02 DIAGNOSIS — N183 Chronic kidney disease, stage 3 (moderate): Secondary | ICD-10-CM | POA: Diagnosis not present

## 2017-03-02 DIAGNOSIS — J449 Chronic obstructive pulmonary disease, unspecified: Secondary | ICD-10-CM | POA: Diagnosis not present

## 2017-03-02 DIAGNOSIS — R269 Unspecified abnormalities of gait and mobility: Secondary | ICD-10-CM | POA: Diagnosis not present

## 2017-03-06 DIAGNOSIS — Z5181 Encounter for therapeutic drug level monitoring: Secondary | ICD-10-CM | POA: Diagnosis not present

## 2017-03-06 DIAGNOSIS — L03116 Cellulitis of left lower limb: Secondary | ICD-10-CM | POA: Diagnosis not present

## 2017-03-07 ENCOUNTER — Ambulatory Visit (HOSPITAL_COMMUNITY): Payer: Medicare Other | Admitting: Physical Therapy

## 2017-03-07 ENCOUNTER — Other Ambulatory Visit: Payer: Self-pay | Admitting: Pharmacist

## 2017-03-07 DIAGNOSIS — E1122 Type 2 diabetes mellitus with diabetic chronic kidney disease: Secondary | ICD-10-CM | POA: Diagnosis not present

## 2017-03-07 DIAGNOSIS — E039 Hypothyroidism, unspecified: Secondary | ICD-10-CM | POA: Diagnosis not present

## 2017-03-07 DIAGNOSIS — E1151 Type 2 diabetes mellitus with diabetic peripheral angiopathy without gangrene: Secondary | ICD-10-CM | POA: Diagnosis not present

## 2017-03-07 DIAGNOSIS — M86172 Other acute osteomyelitis, left ankle and foot: Secondary | ICD-10-CM | POA: Diagnosis not present

## 2017-03-07 DIAGNOSIS — Z7901 Long term (current) use of anticoagulants: Secondary | ICD-10-CM | POA: Diagnosis not present

## 2017-03-07 DIAGNOSIS — N183 Chronic kidney disease, stage 3 (moderate): Secondary | ICD-10-CM | POA: Diagnosis not present

## 2017-03-07 DIAGNOSIS — E785 Hyperlipidemia, unspecified: Secondary | ICD-10-CM | POA: Diagnosis not present

## 2017-03-07 DIAGNOSIS — I5032 Chronic diastolic (congestive) heart failure: Secondary | ICD-10-CM | POA: Diagnosis not present

## 2017-03-07 DIAGNOSIS — Z452 Encounter for adjustment and management of vascular access device: Secondary | ICD-10-CM | POA: Diagnosis not present

## 2017-03-07 DIAGNOSIS — Z5181 Encounter for therapeutic drug level monitoring: Secondary | ICD-10-CM | POA: Diagnosis not present

## 2017-03-07 DIAGNOSIS — I480 Paroxysmal atrial fibrillation: Secondary | ICD-10-CM | POA: Diagnosis not present

## 2017-03-07 DIAGNOSIS — Z794 Long term (current) use of insulin: Secondary | ICD-10-CM | POA: Diagnosis not present

## 2017-03-07 DIAGNOSIS — L03116 Cellulitis of left lower limb: Secondary | ICD-10-CM | POA: Diagnosis not present

## 2017-03-07 DIAGNOSIS — I130 Hypertensive heart and chronic kidney disease with heart failure and stage 1 through stage 4 chronic kidney disease, or unspecified chronic kidney disease: Secondary | ICD-10-CM | POA: Diagnosis not present

## 2017-03-07 DIAGNOSIS — I2510 Atherosclerotic heart disease of native coronary artery without angina pectoris: Secondary | ICD-10-CM | POA: Diagnosis not present

## 2017-03-07 DIAGNOSIS — Z79891 Long term (current) use of opiate analgesic: Secondary | ICD-10-CM | POA: Diagnosis not present

## 2017-03-07 DIAGNOSIS — Z792 Long term (current) use of antibiotics: Secondary | ICD-10-CM | POA: Diagnosis not present

## 2017-03-09 ENCOUNTER — Ambulatory Visit (HOSPITAL_COMMUNITY): Payer: Medicare Other | Admitting: Physical Therapy

## 2017-03-09 DIAGNOSIS — Z794 Long term (current) use of insulin: Secondary | ICD-10-CM | POA: Diagnosis not present

## 2017-03-09 DIAGNOSIS — E039 Hypothyroidism, unspecified: Secondary | ICD-10-CM | POA: Diagnosis not present

## 2017-03-09 DIAGNOSIS — I2510 Atherosclerotic heart disease of native coronary artery without angina pectoris: Secondary | ICD-10-CM | POA: Diagnosis not present

## 2017-03-09 DIAGNOSIS — M86172 Other acute osteomyelitis, left ankle and foot: Secondary | ICD-10-CM | POA: Diagnosis not present

## 2017-03-09 DIAGNOSIS — Z5181 Encounter for therapeutic drug level monitoring: Secondary | ICD-10-CM | POA: Diagnosis not present

## 2017-03-09 DIAGNOSIS — N183 Chronic kidney disease, stage 3 (moderate): Secondary | ICD-10-CM | POA: Diagnosis not present

## 2017-03-09 DIAGNOSIS — E1122 Type 2 diabetes mellitus with diabetic chronic kidney disease: Secondary | ICD-10-CM | POA: Diagnosis not present

## 2017-03-09 DIAGNOSIS — Z792 Long term (current) use of antibiotics: Secondary | ICD-10-CM | POA: Diagnosis not present

## 2017-03-09 DIAGNOSIS — E785 Hyperlipidemia, unspecified: Secondary | ICD-10-CM | POA: Diagnosis not present

## 2017-03-09 DIAGNOSIS — I480 Paroxysmal atrial fibrillation: Secondary | ICD-10-CM | POA: Diagnosis not present

## 2017-03-09 DIAGNOSIS — I5032 Chronic diastolic (congestive) heart failure: Secondary | ICD-10-CM | POA: Diagnosis not present

## 2017-03-09 DIAGNOSIS — Z79891 Long term (current) use of opiate analgesic: Secondary | ICD-10-CM | POA: Diagnosis not present

## 2017-03-09 DIAGNOSIS — I130 Hypertensive heart and chronic kidney disease with heart failure and stage 1 through stage 4 chronic kidney disease, or unspecified chronic kidney disease: Secondary | ICD-10-CM | POA: Diagnosis not present

## 2017-03-09 DIAGNOSIS — E1151 Type 2 diabetes mellitus with diabetic peripheral angiopathy without gangrene: Secondary | ICD-10-CM | POA: Diagnosis not present

## 2017-03-09 DIAGNOSIS — Z452 Encounter for adjustment and management of vascular access device: Secondary | ICD-10-CM | POA: Diagnosis not present

## 2017-03-09 DIAGNOSIS — L03116 Cellulitis of left lower limb: Secondary | ICD-10-CM | POA: Diagnosis not present

## 2017-03-09 DIAGNOSIS — Z7901 Long term (current) use of anticoagulants: Secondary | ICD-10-CM | POA: Diagnosis not present

## 2017-03-10 ENCOUNTER — Other Ambulatory Visit: Payer: Self-pay | Admitting: *Deleted

## 2017-03-10 DIAGNOSIS — Z79891 Long term (current) use of opiate analgesic: Secondary | ICD-10-CM | POA: Diagnosis not present

## 2017-03-10 DIAGNOSIS — I130 Hypertensive heart and chronic kidney disease with heart failure and stage 1 through stage 4 chronic kidney disease, or unspecified chronic kidney disease: Secondary | ICD-10-CM | POA: Diagnosis not present

## 2017-03-10 DIAGNOSIS — E785 Hyperlipidemia, unspecified: Secondary | ICD-10-CM | POA: Diagnosis not present

## 2017-03-10 DIAGNOSIS — E1151 Type 2 diabetes mellitus with diabetic peripheral angiopathy without gangrene: Secondary | ICD-10-CM | POA: Diagnosis not present

## 2017-03-10 DIAGNOSIS — I2510 Atherosclerotic heart disease of native coronary artery without angina pectoris: Secondary | ICD-10-CM | POA: Diagnosis not present

## 2017-03-10 DIAGNOSIS — Z794 Long term (current) use of insulin: Secondary | ICD-10-CM | POA: Diagnosis not present

## 2017-03-10 DIAGNOSIS — Z5181 Encounter for therapeutic drug level monitoring: Secondary | ICD-10-CM | POA: Diagnosis not present

## 2017-03-10 DIAGNOSIS — N183 Chronic kidney disease, stage 3 (moderate): Secondary | ICD-10-CM | POA: Diagnosis not present

## 2017-03-10 DIAGNOSIS — Z7901 Long term (current) use of anticoagulants: Secondary | ICD-10-CM | POA: Diagnosis not present

## 2017-03-10 DIAGNOSIS — E1122 Type 2 diabetes mellitus with diabetic chronic kidney disease: Secondary | ICD-10-CM | POA: Diagnosis not present

## 2017-03-10 DIAGNOSIS — Z452 Encounter for adjustment and management of vascular access device: Secondary | ICD-10-CM | POA: Diagnosis not present

## 2017-03-10 DIAGNOSIS — Z792 Long term (current) use of antibiotics: Secondary | ICD-10-CM | POA: Diagnosis not present

## 2017-03-10 DIAGNOSIS — I5032 Chronic diastolic (congestive) heart failure: Secondary | ICD-10-CM | POA: Diagnosis not present

## 2017-03-10 DIAGNOSIS — E039 Hypothyroidism, unspecified: Secondary | ICD-10-CM | POA: Diagnosis not present

## 2017-03-10 DIAGNOSIS — L03116 Cellulitis of left lower limb: Secondary | ICD-10-CM | POA: Diagnosis not present

## 2017-03-10 DIAGNOSIS — M86172 Other acute osteomyelitis, left ankle and foot: Secondary | ICD-10-CM | POA: Diagnosis not present

## 2017-03-10 DIAGNOSIS — I480 Paroxysmal atrial fibrillation: Secondary | ICD-10-CM | POA: Diagnosis not present

## 2017-03-10 NOTE — Patient Outreach (Signed)
[facility]) Care Management   03/13/2017  KALLUM JORGENSEN 06-25-1946 [id number]  YANNICK STEUBER is an 71 y.o. 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 Mr Boyd haspreviously worked with [facility]. [facility] hospital liaison assisted with getting THNConsent form signed by making his mark, stating he could not write.Patient gave [phone] the best number to reach him. He also gave written permission to call his niece [name] at [phone] he can not be reached.   [facility] CM is engaged for transition of care calls and visits since his 02/14/17 discharge from the hospital This was the second admission in 6 months Mr Skalski previously discharged 12/14/16 He was admitted on 12/12/16 for left foot cellulitis, chronic wound, small ulcer plantar aspect of firstmetatarsal.. He has a PICC line intact in his left upper arm for home IV antibiotics   Subjective: "It is coming down pretty good" referring to his left foot "she changed my dressings today and found another spot on my right foot"  "My balance is off" " I take dizzy medication"  Objective:   BP 102/70   Pulse 70   Temp (!) 97.2 F (36.2 C) (Oral)   Resp 20   Ht 1.778 m (_0 )   Wt 235 lb (106.6 kg)   SpO2 93%   BMI 33.72 kg/m   Review of Systems  Constitutional: Negative for chills, diaphoresis, fever, malaise/fatigue and weight loss.       Reports being off balance related to both feet with "sores" now Using a cane for ambulation but has rolling walker access also   HENT: Negative for congestion, ear discharge, ear pain, hearing loss, nosebleeds, sinus pain, sore throat and tinnitus.   Eyes: Negative.   Negative for blurred vision, double vision, photophobia, pain, discharge and redness.  Respiratory: Negative.  Negative for cough, hemoptysis, sputum production, shortness of breath, wheezing and stridor.   Cardiovascular: Negative.  Negative for chest pain, palpitations, orthopnea, claudication, leg swelling and PND.  Gastrointestinal: Negative.  Negative for abdominal pain, blood in stool, constipation, diarrhea, heartburn, melena, nausea and vomiting.       Last BM 03/09/17 pm   Genitourinary: Negative.  Negative for dysuria, flank pain, frequency, hematuria and urgency.  Musculoskeletal: Negative.  Negative for back pain, falls, joint pain, myalgias and neck pain.  Skin: Negative for itching and rash.       New right foot blacken callous area noted 2x 2 cm   Neurological: Positive for dizziness and weakness. Negative for tingling, tremors, sensory change, speech change, focal weakness, seizures, loss of consciousness and headaches.       Poor balance now with wounds on both feet   Endo/Heme/Allergies: Negative.  Negative for environmental allergies and polydipsia. Does not bruise/bleed easily.  Psychiatric/Behavioral: Negative.  Negative for depression, hallucinations, memory loss, substance abuse and suicidal ideas. The patient is not nervous/anxious and does not have insomnia.     Physical Exam  Constitutional: He is oriented to person, place, and time. He appears well-developed and well-nourished.  HENT:  Head: Normocephalic and atraumatic.  Right Ear: External ear normal.  Left Ear: External ear normal.  Nose: Nose normal.  Mouth/Throat: Oropharynx is clear and moist.  Eyes: Pupils are equal, round, and reactive  to light. Conjunctivae are normal.  Neck: Normal range of motion. Neck supple.  Cardiovascular: Normal rate, regular rhythm, normal heart sounds and intact distal pulses.   Respiratory: Effort normal and breath sounds normal.  GI: Soft. Bowel sounds are normal.   Musculoskeletal: Normal range of motion.  Neurological: He is alert and oriented to person, place, and time.  Skin: Skin is warm and dry.  Psychiatric: He has a normal mood and affect. His behavior is normal. Thought content normal.    Encounter Medications:   Outpatient Encounter Prescriptions as of 03/10/2017  Medication Sig Note  . acetaminophen (TYLENOL) 500 MG tablet Take 1,000 mg by mouth 2 (two) times daily as needed for headache. For pain/headaches   . alprazolam (XANAX) 2 MG tablet Take 1 mg by mouth 3 (three) times daily.    Marland Kitchen amiodarone (PACERONE) 200 MG tablet Take 1 tablet (200 mg total) by mouth daily.   Marland Kitchen apixaban (ELIQUIS) 5 MG TABS tablet Take 1 tablet (5 mg total) by mouth 2 (two) times daily.   Marland Kitchen atorvastatin (LIPITOR) 20 MG tablet Take 1 tablet (20 mg total) by mouth daily at 6 PM.   . ertapenem (INVANZ) IVPB Inject 1 g into the vein daily. Indication: Osteomyelitis Last Day of Therapy: 03/28/17 Labs - Once weekly:  CBC/D and BMP, Labs - Every other week:  ESR and CRP   . fenofibrate (TRICOR) 145 MG tablet TAKE ONE TABLET EACH DAY.   . ferrous sulfate 325 (65 FE) MG EC tablet Take 325 mg by mouth daily with breakfast. 02/07/2017: Is not taking while currently prescribed antibiotic  . gabapentin (NEURONTIN) 300 MG capsule Take 300 mg by mouth 2 (two) times daily.    . insulin glargine (LANTUS) 100 UNIT/ML injection Inject 0.5 mLs (50 Units total) into the skin 2 (two) times daily. 75 units in the morning then 60 units at bedtime 02/17/2017: Changed to 50 u bid, 75 units in am and 60 units hs  . insulin lispro (HUMALOG) 100 UNIT/ML injection Inject 0.08 mLs (8 Units total) into the skin 3 (three) times daily with meals.   . INVOKANA 100 MG TABS tablet Take 1 tablet by mouth daily.   Marland Kitchen levothyroxine (SYNTHROID, LEVOTHROID) 25 MCG tablet Take 25 mcg by mouth daily before breakfast.   . meclizine (ANTIVERT) 25 MG tablet Take 25 mg by mouth 3 (three) times daily as needed for  dizziness.   . metoprolol succinate (TOPROL-XL) 25 MG 24 hr tablet Take 1 tablet (25 mg total) by mouth daily.   . Omega-3 Fatty Acids (FISH OIL) 1200 MG CAPS Take 1 capsule by mouth daily.    . pantoprazole (PROTONIX) 40 MG tablet Take 1 tablet (40 mg total) by mouth 2 (two) times daily.   . silver sulfADIAZINE (SILVADENE) 1 % cream Apply 1 application topically daily.   Marland Kitchen torsemide (DEMADEX) 10 MG tablet Take 1 tablet (10 mg total) by mouth daily.    No facility-administered encounter medications on file as of 03/10/2017.     Functional Status:   In your present state of health, do you have any difficulty performing the following activities: 03/10/2017 02/27/2017  Hearing? Y N  Vision? N N  Difficulty concentrating or making decisions? Y N  Walking or climbing stairs? Y Y  Dressing or bathing? N N  Doing errands, shopping? Y -  Conservation officer, nature and eating ? N -  Using the Toilet? N -  In the past six months, have you accidently leaked  urine? N -  Do you have problems with loss of bowel control? N -  Managing your Medications? N -  Managing your Finances? Y -  Housekeeping or managing your Housekeeping? Y -  Some recent data might be hidden    Fall/Depression Screening:    Fall Risk  03/10/2017 02/06/2017 12/28/2015  Falls in the past year? No No Yes  Comment - - -  Number falls in past yr: - - 2 or more  Comment - - -  Injury with Fall? - - No  Comment - - -  Risk for fall due to : Impaired balance/gait;Impaired mobility;Impaired vision Impaired balance/gait;Impaired mobility;Impaired vision Impaired balance/gait  Risk for fall due to: Comment - - -  Follow up - - Falls prevention discussed   PHQ 2/9 Scores 03/10/2017 02/06/2017 02/06/2017 11/25/2015 11/02/2015 09/30/2015 09/21/2015  PHQ - 2 Score 0 0 0 0 _0 PHQ- 9 Score - - - - _1 Assessment:    Mr Kluth presents sitting in his recliner with his tv at a loud volume and his [facility] nurse left as [facility] CM came  to visit  Dressing to left foot changed today.  Mr Wymer states he also is seen by [facility] PT Betadine placed on right foot by home health nurse today. [facility] CM assisted with placing his sock back on prior to him ambulating to the restroom using his cane Had stents place in left leg two weeks ago He states his goals are to get "my balance better and to get my foot healed" PICC line remains intact without signs of infiltration or infection in left upper arm Continues to have his niece fill his pill box and insulin syringes as noted in refrigerator and on dining room table  Has a follow up appointment with Dr Gerarda Fraction on Monday March 13 2017 Infectious Disease MD- on March 20 2017 Interventional radiologist on April 12 2017  Wrote down these appointments on a sheet of paper for Mr Mayorquin He stated he had difficulty with his niece taking him to [place] appointments in August but would ask her again after [facility] CM called united health care to get transportation assistance but found Mr Dittus is not eligible for transportation assistance.  He is aware of his need to have medicaid for RCATs assistance.  He does not have medicaid and states he can afford the fee for transport otherwise  Plan:  [facility] CM assisted by calling [phone] [employer] care to attempt top set up transportation.  Referred by Janett Billow to united customer services number [phone] Call [phone] spoke with ollie to find transportation is not covered in his united health care plan  Updates per Ollie to include an updated for CHF program which Mr Kimmons refused to participate in, 3 month supply of medications which Mr Sivley refused (tried before and did not like it) and pharmacy benefits which Mr Rafter refused  For  follow up with Mr Van in 2 weeks  Routed notes to MDs listed in EPIC care team [facility] CM Care Plan Problem One     Most Recent Value  Care Plan Problem One  wound left foot   Role Documenting the  Problem One  Care Management [place] for Problem One  Active  [facility] Long Term Goal   over the next 45 days patient will verbalize Knowledge of home care of left foot cellulitis wound  THN Long Term Goal Start Date  03/10/17  Interventions for Problem One Long Term Goal  assess left foot, teach home care monitor progress  [facility] CM Short Term Goal #1   Over the next 30 days left foot would will show signs and symptoms of healing   [facility] CM Short Term Goal #1 Start Date  03/10/17  Interventions for Short Term Goal #1  assess left foot, teach home care monitor progress    [facility] CM Care Plan Problem Two     Most Recent Value  Care Plan Problem Two  Knowledge deficit of home care for chronic medical conditions (CHF, DM, afib, HTN)  Role Documenting the Problem Two  Care Management [name] for Problem Two  Active  Interventions for Problem Two Long Term Goal   assess needs, educate on zones of care and home care for chronic medical conditions, evaluate understanding, CHF scale/weight program  [facility] Long Term Goal Start Date  03/10/17  THN CM Short Term Goal #1   over the next 30 days patient will be able to verbalized zones of care, what to reprot to doctor and how to care for CHF, afib, htn, DM  [facility] CM Short Term Goal #1 Start Date  03/10/17  Interventions for Short Term Goal #2   assess needs, educate on zones of care and home care for chronic medical conditions, evaluate understanding, CHF scale/weight program       Najmah Carradine L. Lavina Hamman, RN, BSN, [place] Care Management [phone]

## 2017-03-13 DIAGNOSIS — E119 Type 2 diabetes mellitus without complications: Secondary | ICD-10-CM | POA: Diagnosis not present

## 2017-03-13 DIAGNOSIS — Z5181 Encounter for therapeutic drug level monitoring: Secondary | ICD-10-CM | POA: Diagnosis not present

## 2017-03-13 DIAGNOSIS — L03116 Cellulitis of left lower limb: Secondary | ICD-10-CM | POA: Diagnosis not present

## 2017-03-13 DIAGNOSIS — I4891 Unspecified atrial fibrillation: Secondary | ICD-10-CM | POA: Diagnosis not present

## 2017-03-15 ENCOUNTER — Other Ambulatory Visit: Payer: Self-pay

## 2017-03-15 ENCOUNTER — Other Ambulatory Visit: Payer: Self-pay | Admitting: Pharmacist

## 2017-03-15 DIAGNOSIS — E039 Hypothyroidism, unspecified: Secondary | ICD-10-CM | POA: Diagnosis not present

## 2017-03-15 DIAGNOSIS — I5032 Chronic diastolic (congestive) heart failure: Secondary | ICD-10-CM | POA: Diagnosis not present

## 2017-03-15 DIAGNOSIS — I130 Hypertensive heart and chronic kidney disease with heart failure and stage 1 through stage 4 chronic kidney disease, or unspecified chronic kidney disease: Secondary | ICD-10-CM | POA: Diagnosis not present

## 2017-03-15 DIAGNOSIS — E785 Hyperlipidemia, unspecified: Secondary | ICD-10-CM | POA: Diagnosis not present

## 2017-03-15 DIAGNOSIS — Z794 Long term (current) use of insulin: Secondary | ICD-10-CM | POA: Diagnosis not present

## 2017-03-15 DIAGNOSIS — I480 Paroxysmal atrial fibrillation: Secondary | ICD-10-CM | POA: Diagnosis not present

## 2017-03-15 DIAGNOSIS — E1122 Type 2 diabetes mellitus with diabetic chronic kidney disease: Secondary | ICD-10-CM | POA: Diagnosis not present

## 2017-03-15 DIAGNOSIS — Z792 Long term (current) use of antibiotics: Secondary | ICD-10-CM | POA: Diagnosis not present

## 2017-03-15 DIAGNOSIS — Z79891 Long term (current) use of opiate analgesic: Secondary | ICD-10-CM | POA: Diagnosis not present

## 2017-03-15 DIAGNOSIS — L03116 Cellulitis of left lower limb: Secondary | ICD-10-CM | POA: Diagnosis not present

## 2017-03-15 DIAGNOSIS — N183 Chronic kidney disease, stage 3 (moderate): Secondary | ICD-10-CM | POA: Diagnosis not present

## 2017-03-15 DIAGNOSIS — I2510 Atherosclerotic heart disease of native coronary artery without angina pectoris: Secondary | ICD-10-CM | POA: Diagnosis not present

## 2017-03-15 DIAGNOSIS — Z452 Encounter for adjustment and management of vascular access device: Secondary | ICD-10-CM | POA: Diagnosis not present

## 2017-03-15 DIAGNOSIS — M86172 Other acute osteomyelitis, left ankle and foot: Secondary | ICD-10-CM | POA: Diagnosis not present

## 2017-03-15 DIAGNOSIS — Z5181 Encounter for therapeutic drug level monitoring: Secondary | ICD-10-CM | POA: Diagnosis not present

## 2017-03-15 DIAGNOSIS — Z7901 Long term (current) use of anticoagulants: Secondary | ICD-10-CM | POA: Diagnosis not present

## 2017-03-15 DIAGNOSIS — E1151 Type 2 diabetes mellitus with diabetic peripheral angiopathy without gangrene: Secondary | ICD-10-CM | POA: Diagnosis not present

## 2017-03-15 MED ORDER — FENOFIBRATE 145 MG PO TABS: 145.0000 mg | ORAL_TABLET | Freq: Every day | ORAL | 10 refills | Status: DC

## 2017-03-16 DIAGNOSIS — J449 Chronic obstructive pulmonary disease, unspecified: Secondary | ICD-10-CM | POA: Diagnosis not present

## 2017-03-16 DIAGNOSIS — M869 Osteomyelitis, unspecified: Secondary | ICD-10-CM | POA: Diagnosis not present

## 2017-03-16 DIAGNOSIS — R0602 Shortness of breath: Secondary | ICD-10-CM | POA: Diagnosis not present

## 2017-03-16 DIAGNOSIS — R269 Unspecified abnormalities of gait and mobility: Secondary | ICD-10-CM | POA: Diagnosis not present

## 2017-03-16 DIAGNOSIS — N183 Chronic kidney disease, stage 3 (moderate): Secondary | ICD-10-CM | POA: Diagnosis not present

## 2017-03-17 DIAGNOSIS — Z792 Long term (current) use of antibiotics: Secondary | ICD-10-CM | POA: Diagnosis not present

## 2017-03-17 DIAGNOSIS — I2510 Atherosclerotic heart disease of native coronary artery without angina pectoris: Secondary | ICD-10-CM | POA: Diagnosis not present

## 2017-03-17 DIAGNOSIS — E039 Hypothyroidism, unspecified: Secondary | ICD-10-CM | POA: Diagnosis not present

## 2017-03-17 DIAGNOSIS — I5032 Chronic diastolic (congestive) heart failure: Secondary | ICD-10-CM | POA: Diagnosis not present

## 2017-03-17 DIAGNOSIS — Z5181 Encounter for therapeutic drug level monitoring: Secondary | ICD-10-CM | POA: Diagnosis not present

## 2017-03-17 DIAGNOSIS — Z794 Long term (current) use of insulin: Secondary | ICD-10-CM | POA: Diagnosis not present

## 2017-03-17 DIAGNOSIS — Z79891 Long term (current) use of opiate analgesic: Secondary | ICD-10-CM | POA: Diagnosis not present

## 2017-03-17 DIAGNOSIS — E785 Hyperlipidemia, unspecified: Secondary | ICD-10-CM | POA: Diagnosis not present

## 2017-03-17 DIAGNOSIS — I480 Paroxysmal atrial fibrillation: Secondary | ICD-10-CM | POA: Diagnosis not present

## 2017-03-17 DIAGNOSIS — Z452 Encounter for adjustment and management of vascular access device: Secondary | ICD-10-CM | POA: Diagnosis not present

## 2017-03-17 DIAGNOSIS — E1122 Type 2 diabetes mellitus with diabetic chronic kidney disease: Secondary | ICD-10-CM | POA: Diagnosis not present

## 2017-03-17 DIAGNOSIS — L03116 Cellulitis of left lower limb: Secondary | ICD-10-CM | POA: Diagnosis not present

## 2017-03-17 DIAGNOSIS — N183 Chronic kidney disease, stage 3 (moderate): Secondary | ICD-10-CM | POA: Diagnosis not present

## 2017-03-17 DIAGNOSIS — M86172 Other acute osteomyelitis, left ankle and foot: Secondary | ICD-10-CM | POA: Diagnosis not present

## 2017-03-17 DIAGNOSIS — I130 Hypertensive heart and chronic kidney disease with heart failure and stage 1 through stage 4 chronic kidney disease, or unspecified chronic kidney disease: Secondary | ICD-10-CM | POA: Diagnosis not present

## 2017-03-17 DIAGNOSIS — E1151 Type 2 diabetes mellitus with diabetic peripheral angiopathy without gangrene: Secondary | ICD-10-CM | POA: Diagnosis not present

## 2017-03-17 DIAGNOSIS — Z7901 Long term (current) use of anticoagulants: Secondary | ICD-10-CM | POA: Diagnosis not present

## 2017-03-19 DIAGNOSIS — R269 Unspecified abnormalities of gait and mobility: Secondary | ICD-10-CM | POA: Diagnosis not present

## 2017-03-19 DIAGNOSIS — R0602 Shortness of breath: Secondary | ICD-10-CM | POA: Diagnosis not present

## 2017-03-19 DIAGNOSIS — J449 Chronic obstructive pulmonary disease, unspecified: Secondary | ICD-10-CM | POA: Diagnosis not present

## 2017-03-19 DIAGNOSIS — M869 Osteomyelitis, unspecified: Secondary | ICD-10-CM | POA: Diagnosis not present

## 2017-03-19 DIAGNOSIS — N183 Chronic kidney disease, stage 3 (moderate): Secondary | ICD-10-CM | POA: Diagnosis not present

## 2017-03-20 ENCOUNTER — Ambulatory Visit (INDEPENDENT_AMBULATORY_CARE_PROVIDER_SITE_OTHER): Payer: Medicare Other | Admitting: Internal Medicine

## 2017-03-20 ENCOUNTER — Encounter: Payer: Self-pay | Admitting: Internal Medicine

## 2017-03-20 ENCOUNTER — Ambulatory Visit: Payer: Medicare Other | Admitting: Internal Medicine

## 2017-03-20 ENCOUNTER — Other Ambulatory Visit: Payer: Self-pay | Admitting: *Deleted

## 2017-03-20 DIAGNOSIS — E1169 Type 2 diabetes mellitus with other specified complication: Secondary | ICD-10-CM | POA: Diagnosis not present

## 2017-03-20 DIAGNOSIS — L03116 Cellulitis of left lower limb: Secondary | ICD-10-CM | POA: Diagnosis not present

## 2017-03-20 DIAGNOSIS — M869 Osteomyelitis, unspecified: Secondary | ICD-10-CM | POA: Diagnosis not present

## 2017-03-20 DIAGNOSIS — Z5181 Encounter for therapeutic drug level monitoring: Secondary | ICD-10-CM | POA: Diagnosis not present

## 2017-03-20 MED ORDER — AMOXICILLIN-POT CLAVULANATE 500-125 MG PO TABS: 1.0000 | ORAL_TABLET | Freq: Two times a day (BID) | ORAL | 0 refills | Status: DC

## 2017-03-20 MED ORDER — FENOFIBRATE 145 MG PO TABS
145.0000 mg | ORAL_TABLET | Freq: Every day | ORAL | 0 refills | Status: AC
Start: 2017-03-20 — End: ?

## 2017-03-20 NOTE — Progress Notes (Signed)
[facility] for Infectious Disease  Patient Active Problem List   Diagnosis Date Noted  . Osteomyelitis due to type 2 diabetes mellitus ([place])   . Diabetic foot infection ([place]) 02/07/2017  . Type 2 diabetes mellitus with left diabetic foot infection ([place]) 02/07/2017  . Uncontrolled type 2 diabetes mellitus with hyperglycemia, with long-term current use of insulin ([place]) 02/07/2017  . Cellulitis of left foot 12/12/2016  . Nausea & vomiting 02/24/2016  . A-fib ([place]) 02/23/2016  . Hypothyroidism 02/23/2016  . CKD (chronic kidney disease), stage III 02/23/2016  . Anxiety 02/23/2016  . Chronic combined systolic (congestive) and diastolic (congestive) heart failure ([place]) 02/23/2016  . Dizziness 02/23/2016  . Open wound of right foot 02/23/2016  . Rectal bleeding 07/28/2015  . Chronic anticoagulation 07/28/2015  . PVD (peripheral vascular disease) ([place])   . S/P angioplasty 04/23/2015  . Peripheral arterial occlusive disease ([place]) 04/23/2015  . PAD (peripheral artery disease) ([place])   . Critical lower limb ischemia   . Toe ulcer, right ([place])   . Type II or unspecified type diabetes mellitus without mention of complication, uncontrolled 11/27/2013  . Acute respiratory failure with hypoxia ([place]) 11/26/2013  . CAP (community acquired pneumonia) 11/26/2013  . CHF exacerbation ([place]) 11/25/2013  . Cardiomyopathy, ischemic 06/13/2013  . At risk for sudden cardiac death June 19, 2013  . S/P cardiac catheterization, Rt & Lt heart cath 06/05/2013 2013/06/19  . Acute on chronic combined systolic and diastolic HF (heart failure), NYHA class 3 ([place]) 06/04/2013  . Atrial flutter with rapid ventricular response ([place]) 06/02/2013  . Acute renal failure: Cr up to 1.8 06/02/2013  . Hyperkalemia 06/02/2013    Class: Acute  . LBBB (left bundle branch block) 06/01/2013  . Hypotension- secondary to AF and Diltiazem Rx 06/01/2013  . Obesity (BMI 30-39.9) 06/01/2013  . Acute on chronic diastolic  heart failure - due to Afib AVR 06/01/2013  . Atrial fibrillation with RVR- converted with Amiodarone 05/31/2013  . Stroke- Lt brain 2003 04/08/2013  . Hemiplegia affecting right dominant side ([place]) 04/08/2013  . Cataract 04/08/2013  . GERD (gastroesophageal reflux disease) 04/08/2013  . S/P CABG x 3 - 2010 04/08/2013  . S/P AVR-tissue, 2010 04/08/2013  . DM2 (diabetes mellitus, type 2) ([place]) 04/08/2013  . Dyslipidemia 04/08/2013  . SUBACROMIAL BURSITIS, LEFT 11/05/2009    Patient's Medications  New Prescriptions   AMOXICILLIN-CLAVULANATE (AUGMENTIN) 500-125 MG TABLET    Take 1 tablet (500 mg total) by mouth 2 (two) times daily.  Previous Medications   ACETAMINOPHEN (TYLENOL) 500 MG TABLET    Take 1,000 mg by mouth 2 (two) times daily as needed for headache. For pain/headaches   ALPRAZOLAM (XANAX) 2 MG TABLET    Take 1 mg by mouth 3 (three) times daily.    AMIODARONE (PACERONE) 200 MG TABLET    Take 1 tablet (200 mg total) by mouth daily.   APIXABAN (ELIQUIS) 5 MG TABS TABLET    Take 1 tablet (5 mg total) by mouth 2 (two) times daily.   ATORVASTATIN (LIPITOR) 20 MG TABLET    Take 1 tablet (20 mg total) by mouth daily at 6 PM.   FENOFIBRATE (TRICOR) 145 MG TABLET    Take 1 tablet (145 mg total) by mouth daily.   FERROUS SULFATE 325 (65 FE) MG EC TABLET    Take 325 mg by mouth daily with breakfast.   GABAPENTIN (NEURONTIN) 300 MG CAPSULE    Take 300 mg by mouth 2 (  two) times daily.    INSULIN GLARGINE (LANTUS) 100 UNIT/ML INJECTION    Inject 0.5 mLs (50 Units total) into the skin 2 (two) times daily. 75 units in the morning then 60 units at bedtime   INSULIN LISPRO (HUMALOG) 100 UNIT/ML INJECTION    Inject 0.08 mLs (8 Units total) into the skin 3 (three) times daily with meals.   INVOKANA 100 MG TABS TABLET    Take 1 tablet by mouth daily.   LEVOTHYROXINE (SYNTHROID, LEVOTHROID) 25 MCG TABLET    Take 25 mcg by mouth daily before breakfast.   MECLIZINE (ANTIVERT) 25 MG TABLET    Take 25 mg  by mouth 3 (three) times daily as needed for dizziness.   METOPROLOL SUCCINATE (TOPROL-XL) 25 MG 24 HR TABLET    Take 1 tablet (25 mg total) by mouth daily.   OMEGA-3 FATTY ACIDS (FISH OIL) 1200 MG CAPS    Take 1 capsule by mouth daily.    PANTOPRAZOLE (PROTONIX) 40 MG TABLET    Take 1 tablet (40 mg total) by mouth 2 (two) times daily.   SILVER SULFADIAZINE (SILVADENE) 1 % CREAM    Apply 1 application topically daily.   TORSEMIDE (DEMADEX) 10 MG TABLET    Take 1 tablet (10 mg total) by mouth daily.  Modified Medications   No medications on file  Discontinued Medications   ERTAPENEM (INVANZ) IVPB    Inject 1 g into the vein daily. Indication: Osteomyelitis Last Day of Therapy: 03/28/17 Labs - Once weekly:  CBC/D and BMP, Labs - Every other week:  ESR and CRP    Subjective: [name] is in for his hospital follow-up visit. He has diabetes and peripheral vascular disease complicated by diabetic neuropathy. He was admitted to the hospital one month ago with osteomyelitis and septic arthritis of his left great toe. He did not want amputation so I recommended discharge on IV ertapenem. He has had no problems tolerating ertapenem or his PICC. He is now completed 42 days of therapy. He is feeling better. He is not having any pain. He notes that the swelling and redness of his left foot has improved.  Review of Systems: Review of Systems  Constitutional: Negative for chills, diaphoresis and fever.  Gastrointestinal: Negative for abdominal pain, diarrhea, nausea and vomiting.  Musculoskeletal: Negative for joint pain.    Past Medical History:  Diagnosis Date  . At risk for sudden cardiac death 2013-07-03  . Cataract   . Chronic anticoagulation 05/2013   started  . Chronic kidney disease    Patient reports he is seeing Kidney doctor in September  . Coronary artery disease   . Diabetes mellitus    type 2   . Dyslipidemia   . GERD (gastroesophageal reflux disease)   . History of valve  replacement 2010   [facility] Ease bioprosthetic 20m  . Hypertension   . PAF (paroxysmal atrial fibrillation) ([place] 05/2013   converted with amiodarone to SR  . S/P CABG x 3 2010  . S/P cardiac catheterization, Rt & Lt heart cath 06/05/2013 119-Nov-2014 . Stroke ([facility] 2003   R-sided weakness & upper extremity swelling   . Thyroid disease     Social History  Substance Use Topics  . Smoking status: Former Smoker    Types: Cigarettes    Quit date: 08/15/2001  . Smokeless tobacco: Former [employer] developer   Types: Chew     Comment: Quit in 2003  . Alcohol use No    Family  History  Problem Relation Age of Onset  . Heart attack Mother   . Liver disease Brother   . Diabetes Sister        x4    No Known Allergies  Objective: Vitals:   03/20/17 1459  BP: 115/71  Pulse: 69  Temp: (!) 97.5 F (36.4 C)  TempSrc: Oral  Weight: 235 lb (106.6 kg)  Height: '5\' 11"'  (1.803 m)   Body mass index is 32.78 kg/m.  Physical Exam  Constitutional: He is oriented to person, place, and time.  He is in good spirits. He has trouble answering some questions telling me "I have trouble remembering things."  Musculoskeletal:  He still has some residual redness and swelling medial to the left first metatarsal head extending up the great toe but this is much better than when I last saw him in the hospital one month ago. He has no pain with range of motion of that toe.  Neurological: He is alert and oriented to person, place, and time.  Skin: No rash noted.  Left arm PICC site looks good.  Psychiatric: Affect normal.    Lab Results    Problem List Items Addressed This Visit      Unprioritized   Osteomyelitis due to type 2 diabetes mellitus ([place])    He is better but still has clinical evidence of possible persistent infection. His inflammatory markers have improved but are still elevated. I discussed management options with him. I will stop his ertapenem and have the PICC removed but continue  treatment with oral amoxicillin clavulanate. He will follow-up here in one month.      Relevant Medications   amoxicillin-clavulanate (AUGMENTIN) 500-125 MG tablet       Rickey Bickers, MD [facility] for [facility] [phone] pager   [phone] cell 03/20/2017, 4:02 PM

## 2017-03-20 NOTE — Assessment & Plan Note (Signed)
He is better but still has clinical evidence of possible persistent infection. His inflammatory markers have improved but are still elevated. I discussed management options with him. I will stop his ertapenem and have the PICC removed but continue treatment with oral amoxicillin clavulanate. He will follow-up here in one month.

## 2017-03-21 ENCOUNTER — Ambulatory Visit: Payer: Medicare Other | Admitting: Internal Medicine

## 2017-03-21 ENCOUNTER — Telehealth: Payer: Self-pay | Admitting: *Deleted

## 2017-03-21 DIAGNOSIS — E1151 Type 2 diabetes mellitus with diabetic peripheral angiopathy without gangrene: Secondary | ICD-10-CM | POA: Diagnosis not present

## 2017-03-21 DIAGNOSIS — Z7901 Long term (current) use of anticoagulants: Secondary | ICD-10-CM | POA: Diagnosis not present

## 2017-03-21 DIAGNOSIS — E039 Hypothyroidism, unspecified: Secondary | ICD-10-CM | POA: Diagnosis not present

## 2017-03-21 DIAGNOSIS — I130 Hypertensive heart and chronic kidney disease with heart failure and stage 1 through stage 4 chronic kidney disease, or unspecified chronic kidney disease: Secondary | ICD-10-CM | POA: Diagnosis not present

## 2017-03-21 DIAGNOSIS — Z79891 Long term (current) use of opiate analgesic: Secondary | ICD-10-CM | POA: Diagnosis not present

## 2017-03-21 DIAGNOSIS — Z452 Encounter for adjustment and management of vascular access device: Secondary | ICD-10-CM | POA: Diagnosis not present

## 2017-03-21 DIAGNOSIS — E785 Hyperlipidemia, unspecified: Secondary | ICD-10-CM | POA: Diagnosis not present

## 2017-03-21 DIAGNOSIS — L03116 Cellulitis of left lower limb: Secondary | ICD-10-CM | POA: Diagnosis not present

## 2017-03-21 DIAGNOSIS — Z794 Long term (current) use of insulin: Secondary | ICD-10-CM | POA: Diagnosis not present

## 2017-03-21 DIAGNOSIS — I2510 Atherosclerotic heart disease of native coronary artery without angina pectoris: Secondary | ICD-10-CM | POA: Diagnosis not present

## 2017-03-21 DIAGNOSIS — N183 Chronic kidney disease, stage 3 (moderate): Secondary | ICD-10-CM | POA: Diagnosis not present

## 2017-03-21 DIAGNOSIS — M86172 Other acute osteomyelitis, left ankle and foot: Secondary | ICD-10-CM | POA: Diagnosis not present

## 2017-03-21 DIAGNOSIS — I480 Paroxysmal atrial fibrillation: Secondary | ICD-10-CM | POA: Diagnosis not present

## 2017-03-21 DIAGNOSIS — I5032 Chronic diastolic (congestive) heart failure: Secondary | ICD-10-CM | POA: Diagnosis not present

## 2017-03-21 DIAGNOSIS — Z5181 Encounter for therapeutic drug level monitoring: Secondary | ICD-10-CM | POA: Diagnosis not present

## 2017-03-21 DIAGNOSIS — E1122 Type 2 diabetes mellitus with diabetic chronic kidney disease: Secondary | ICD-10-CM | POA: Diagnosis not present

## 2017-03-21 DIAGNOSIS — Z792 Long term (current) use of antibiotics: Secondary | ICD-10-CM | POA: Diagnosis not present

## 2017-03-21 NOTE — Telephone Encounter (Signed)
[name], [facility], calling for orders regarding PICC stop date. Patient is currently receiving a dose of IV antibiotics today, he has a caregiver administering it to him. His nurse if off today, will come tomorrow to pull PICC.  [name] confirmed oral antibiotic order of amoxicillin-clavulanate 500-125 twice daily by mouth, will see if it is ready for pick up at [facility] [name], RN

## 2017-03-22 ENCOUNTER — Other Ambulatory Visit: Payer: Self-pay | Admitting: Pharmacist

## 2017-03-22 DIAGNOSIS — L03116 Cellulitis of left lower limb: Secondary | ICD-10-CM | POA: Diagnosis not present

## 2017-03-22 DIAGNOSIS — Z5181 Encounter for therapeutic drug level monitoring: Secondary | ICD-10-CM | POA: Diagnosis not present

## 2017-03-23 ENCOUNTER — Telehealth (HOSPITAL_COMMUNITY): Payer: Self-pay | Admitting: Physical Therapy

## 2017-03-23 DIAGNOSIS — M86172 Other acute osteomyelitis, left ankle and foot: Secondary | ICD-10-CM | POA: Diagnosis not present

## 2017-03-23 DIAGNOSIS — I130 Hypertensive heart and chronic kidney disease with heart failure and stage 1 through stage 4 chronic kidney disease, or unspecified chronic kidney disease: Secondary | ICD-10-CM | POA: Diagnosis not present

## 2017-03-23 DIAGNOSIS — E1151 Type 2 diabetes mellitus with diabetic peripheral angiopathy without gangrene: Secondary | ICD-10-CM | POA: Diagnosis not present

## 2017-03-23 DIAGNOSIS — E785 Hyperlipidemia, unspecified: Secondary | ICD-10-CM | POA: Diagnosis not present

## 2017-03-23 DIAGNOSIS — Z5181 Encounter for therapeutic drug level monitoring: Secondary | ICD-10-CM | POA: Diagnosis not present

## 2017-03-23 DIAGNOSIS — I2510 Atherosclerotic heart disease of native coronary artery without angina pectoris: Secondary | ICD-10-CM | POA: Diagnosis not present

## 2017-03-23 DIAGNOSIS — Z452 Encounter for adjustment and management of vascular access device: Secondary | ICD-10-CM | POA: Diagnosis not present

## 2017-03-23 DIAGNOSIS — Z792 Long term (current) use of antibiotics: Secondary | ICD-10-CM | POA: Diagnosis not present

## 2017-03-23 DIAGNOSIS — Z7901 Long term (current) use of anticoagulants: Secondary | ICD-10-CM | POA: Diagnosis not present

## 2017-03-23 DIAGNOSIS — I5032 Chronic diastolic (congestive) heart failure: Secondary | ICD-10-CM | POA: Diagnosis not present

## 2017-03-23 DIAGNOSIS — I480 Paroxysmal atrial fibrillation: Secondary | ICD-10-CM | POA: Diagnosis not present

## 2017-03-23 DIAGNOSIS — L03116 Cellulitis of left lower limb: Secondary | ICD-10-CM | POA: Diagnosis not present

## 2017-03-23 DIAGNOSIS — Z79891 Long term (current) use of opiate analgesic: Secondary | ICD-10-CM | POA: Diagnosis not present

## 2017-03-23 DIAGNOSIS — E1122 Type 2 diabetes mellitus with diabetic chronic kidney disease: Secondary | ICD-10-CM | POA: Diagnosis not present

## 2017-03-23 DIAGNOSIS — N183 Chronic kidney disease, stage 3 (moderate): Secondary | ICD-10-CM | POA: Diagnosis not present

## 2017-03-23 DIAGNOSIS — Z794 Long term (current) use of insulin: Secondary | ICD-10-CM | POA: Diagnosis not present

## 2017-03-23 DIAGNOSIS — E039 Hypothyroidism, unspecified: Secondary | ICD-10-CM | POA: Diagnosis not present

## 2017-03-23 NOTE — Telephone Encounter (Signed)
Patient called ask if we had a referral from MD in [facility] for him to come back? There is not referral at our office as of today. NF

## 2017-03-24 ENCOUNTER — Other Ambulatory Visit: Payer: Self-pay | Admitting: *Deleted

## 2017-03-24 NOTE — Patient Outreach (Signed)
[facility]) Care Management   03/24/2017  JSEAN TAUSSIG 18-Apr-1946 [id number]  [name] is an 71 y.o. male  Subjective:   Objective:   BP 110/60   Pulse 69   Temp (!) 97.2 F (36.2 C) (Axillary)   Resp 18   SpO2 96%   Review of Systems  Constitutional: Negative for diaphoresis.  HENT: Positive for hearing loss.   Eyes: Negative.   Cardiovascular: Positive for leg swelling.       Left foot swelling  Gastrointestinal: Negative.   Genitourinary: Positive for frequency.       Related to diuretic use vd x 1 during visit  Musculoskeletal: Positive for falls and joint pain.  Neurological: Positive for dizziness and weakness.  Endo/Heme/Allergies: Bruises/bleeds easily.  Psychiatric/Behavioral: Negative.     Physical Exam  Constitutional: He is oriented to person, place, and time. He appears well-developed and well-nourished.  HENT:  Head: Normocephalic and atraumatic.  Right Ear: External ear normal.  Left Ear: External ear normal.  Nose: Nose normal.  Eyes: Pupils are equal, round, and reactive to light. Conjunctivae are normal.  Neck: Normal range of motion. Neck supple.  Cardiovascular: Normal rate and normal heart sounds.   Respiratory: Effort normal and breath sounds normal.  GI: Soft. Bowel sounds are normal.  Musculoskeletal: He exhibits tenderness.  Neurological: He is alert and oriented to person, place, and time.  Skin: Skin is warm and dry.  Psychiatric: He has a normal mood and affect. His behavior is normal. Judgment and thought content normal.    Encounter Medications:   Outpatient Encounter Prescriptions as of 03/24/2017  Medication Sig Note  . acetaminophen (TYLENOL) 500 MG tablet Take 1,000 mg by mouth 2 (two) times daily as needed for headache. For pain/headaches   . alprazolam (XANAX) 2 MG tablet Take 1 mg by mouth 3 (three) times daily.    Marland Kitchen amiodarone (PACERONE) 200 MG tablet Take 1 tablet (200 mg total) by mouth  daily.   Marland Kitchen amoxicillin-clavulanate (AUGMENTIN) 500-125 MG tablet Take 1 tablet (500 mg total) by mouth 2 (two) times daily.   Marland Kitchen apixaban (ELIQUIS) 5 MG TABS tablet Take 1 tablet (5 mg total) by mouth 2 (two) times daily.   Marland Kitchen atorvastatin (LIPITOR) 20 MG tablet Take 1 tablet (20 mg total) by mouth daily at 6 PM.   . fenofibrate (TRICOR) 145 MG tablet Take 1 tablet (145 mg total) by mouth daily.   . ferrous sulfate 325 (65 FE) MG EC tablet Take 325 mg by mouth daily with breakfast. 02/07/2017: Is not taking while currently prescribed antibiotic  . gabapentin (NEURONTIN) 300 MG capsule Take 300 mg by mouth 2 (two) times daily.    . insulin glargine (LANTUS) 100 UNIT/ML injection Inject 0.5 mLs (50 Units total) into the skin 2 (two) times daily. 75 units in the morning then 60 units at bedtime 02/17/2017: Changed to 50 u bid, 75 units in am and 60 units hs  . insulin lispro (HUMALOG) 100 UNIT/ML injection Inject 0.08 mLs (8 Units total) into the skin 3 (three) times daily with meals.   . INVOKANA 100 MG TABS tablet Take 1 tablet by mouth daily.   Marland Kitchen levothyroxine (SYNTHROID, LEVOTHROID) 25 MCG tablet Take 25 mcg by mouth daily before breakfast.   . meclizine (ANTIVERT) 25 MG tablet Take 25 mg by mouth 3 (three) times daily as needed for dizziness.   . metoprolol succinate (TOPROL-XL) 25 MG 24 hr tablet Take 1 tablet (25  mg total) by mouth daily.   . Omega-3 Fatty Acids (FISH OIL) 1200 MG CAPS Take 1 capsule by mouth daily.    . pantoprazole (PROTONIX) 40 MG tablet Take 1 tablet (40 mg total) by mouth 2 (two) times daily.   . silver sulfADIAZINE (SILVADENE) 1 % cream Apply 1 application topically daily.   Marland Kitchen torsemide (DEMADEX) 10 MG tablet Take 1 tablet (10 mg total) by mouth daily.    No facility-administered encounter medications on file as of 03/24/2017.     Functional Status:   In your present state of health, do you have any difficulty performing the following activities: 03/10/2017 02/27/2017   Hearing? Y N  Vision? N N  Difficulty concentrating or making decisions? Y N  Walking or climbing stairs? Y Y  Dressing or bathing? N N  Doing errands, shopping? Y -  Conservation officer, nature and eating ? N -  Using the Toilet? N -  In the past six months, have you accidently leaked urine? N -  Do you have problems with loss of bowel control? N -  Managing your Medications? N -  Managing your Finances? Y -  Housekeeping or managing your Housekeeping? Y -  Some recent data might be hidden    Fall/Depression Screening:    Fall Risk  03/10/2017 02/06/2017 12/28/2015  Falls in the past year? No No Yes  Comment - - -  Number falls in past yr: - - 2 or more  Comment - - -  Injury with Fall? - - No  Comment - - -  Risk for fall due to : Impaired balance/gait;Impaired mobility;Impaired vision Impaired balance/gait;Impaired mobility;Impaired vision Impaired balance/gait  Risk for fall due to: Comment - - -  Follow up - - Falls prevention discussed   PHQ 2/9 Scores 03/10/2017 02/06/2017 02/06/2017 11/25/2015 11/02/2015 09/30/2015 09/21/2015  PHQ - 2 Score 0 0 0 0 _0 PHQ- 9 Score - - - - _1 Assessment:    [name] remains HOH His TV is up loud today, unable to hear [facility] CM when arrived and knocked on his door until the [facility] CM called his cell phone (keeps in his pocket)  [name] states he has "no hearing aids" and 'Never had a hearing aid",  "I can't afford a hearing aid" " I worked in [employer] for thirteen year and can't hear" Refused assistance to set up services for hearing aid States one of his sister was getting one and he found out how expensive it is   Mobility  Walking today unsteady with cane and has RW available  He went to his past appointment with Dr Megan Salon with the assistance of his "nephew who lives in [place]"  I have to move "by Christmas. I am going back home to [facility]"    [name] reports "legs gave out on me when I was using the bathroom Sunday  and obtained right knee "rug burn" He reports having to his called niece in [place] to come assist with his care He reports "No one is close in [place]" [place] was at home for him to contact  Discussed [place] alert services including cost but refused related to cost Encouraged to keep phone near him or call EMS but he refused the offer  PT, RN last seen on 03/23/17 [facility] last day on 03/27/17   Follow up appointment Has not seen Dr Gerarda Fraction yet Has not made an appointment  Support system-Sisters to visit on 03/25/17   03/20/17 started amoxicillin  500/125 mg bid per Dr Megan Salon  PICC line removed from left upper arm site painful to touch, no redness or drainage  Previous smoker 2 ppd Stopped in 2003  Reports difficulty with paying specialist co pays related to endocrinologist Had an appointment but did not go to last one  [name] and [facility] CM reviewed his goals- Wound- left foot healed but now has a dark painful black bunion on right by little toe HHRN aware and checks Pt uses betadine to area [facility] states healing Discussed causes related to poor circulation Encouraged not crossing his legs, ankles and elevation of feet, use of Eliquis, limiting green leafy vegetables  Discussed how to improve circulation and prevention of poor circulation Pt reports stents in both legs   Diabetes Had one hypoglycemia with dizziness Discussed eating more meats, vegetable and fruits and decreasing carbohydrates States no longer have silvadene and not used in a while on wound  Plan:  [facility] CM assisted by writing down his upcoming appointments so he can have his nephew assist with getting to them  [facility] CM re reviewed CHF, CHF levels of care and when to report symptoms to pcp or CV [facility] CM will see [name] in 2-4 weeks for a follow up home visit  [facility] CM Care Plan Problem One     Most Recent Value  Care Plan Problem One  wound left foot   Role Documenting the Problem One  Care Management Coordinator   Care Plan for Problem One  Active  [facility] Long Term Goal   over the next 45 days patient will verbalize Knowledge of home care of left foot cellulitis wound  [facility] Long Term Goal Start Date  03/10/17  Interventions for Problem One Long Term Goal  assess left foot, teach home care monitor progress  [facility] CM Short Term Goal #1   Over the next 30 days left foot would will show signs and symptoms of healing   [facility] CM Short Term Goal #1 Start Date  03/10/17  [facility] CM Short Term Goal #1 Met Date  03/24/17  Interventions for Short Term Goal #1  assess left foot, teach home care monitor progress    [facility] CM Care Plan Problem Two     Most Recent Value  Care Plan Problem Two  Knowledge deficit of home care for chronic medical conditions (CHF, DM, afib, HTN)  Role Documenting the Problem Two  Care Management Coordinator  Care Plan for Problem Two  Active  Interventions for Problem Two Long Term Goal   assess needs, educate on zones of care and home care for chronic medical conditions, evaluate understanding, CHF scale/weight program  [facility] Long Term Goal Start Date  03/10/17  [facility] CM Short Term Goal #1   over the next 30 days patient will be able to verbalized zones of care, what to reprot to doctor and how to care for CHF, afib, htn, DM  [facility] CM Short Term Goal #1 Start Date  03/10/17  Interventions for Short Term Goal #2   assess needs, educate on zones of care and home care for chronic medical conditions, evaluate understanding, CHF scale/weight program      Kimberly L. Lavina Hamman, RN, BSN, [place] Care Management [phone]

## 2017-03-27 DIAGNOSIS — Z5181 Encounter for therapeutic drug level monitoring: Secondary | ICD-10-CM | POA: Diagnosis not present

## 2017-03-27 DIAGNOSIS — L03116 Cellulitis of left lower limb: Secondary | ICD-10-CM | POA: Diagnosis not present

## 2017-03-30 ENCOUNTER — Telehealth (HOSPITAL_COMMUNITY): Payer: Self-pay | Admitting: Internal Medicine

## 2017-03-30 DIAGNOSIS — L03116 Cellulitis of left lower limb: Secondary | ICD-10-CM | POA: Diagnosis not present

## 2017-03-30 DIAGNOSIS — Z5181 Encounter for therapeutic drug level monitoring: Secondary | ICD-10-CM | POA: Diagnosis not present

## 2017-03-30 NOTE — Telephone Encounter (Signed)
03/30/17  [facility] called to see if we had received an order from Dr. Gerarda Fraction to resume wound care with [place].  So far we haven't received but I did complete a Referral and fax over ..... Home health will end today.

## 2017-04-03 ENCOUNTER — Ambulatory Visit (HOSPITAL_COMMUNITY): Payer: Medicare Other | Attending: Internal Medicine | Admitting: Physical Therapy

## 2017-04-03 DIAGNOSIS — E08621 Diabetes mellitus due to underlying condition with foot ulcer: Secondary | ICD-10-CM | POA: Diagnosis not present

## 2017-04-03 DIAGNOSIS — M79671 Pain in right foot: Secondary | ICD-10-CM | POA: Diagnosis not present

## 2017-04-03 DIAGNOSIS — R600 Localized edema: Secondary | ICD-10-CM

## 2017-04-03 DIAGNOSIS — R262 Difficulty in walking, not elsewhere classified: Secondary | ICD-10-CM | POA: Diagnosis not present

## 2017-04-03 DIAGNOSIS — L97513 Non-pressure chronic ulcer of other part of right foot with necrosis of muscle: Secondary | ICD-10-CM | POA: Insufficient documentation

## 2017-04-03 NOTE — Therapy (Addendum)
[place], [name], [address] Phone: [phone]   Fax:  [phone]  Wound Care Evaluation  Patient Details  Name: [name] MRN: [id number] Date of Birth: [birth year] Referring Provider: Nicholes Mango  Encounter Date: 04/03/2017      PT End of Session - 04/03/17 1328    Visit Number 1   Number of Visits 16   Date for PT Re-Evaluation 05/03/17   Authorization Type [employer] medicare   Authorization - Visit Number 1   Authorization - Number of Visits 10   PT Start Time 1330   PT Stop Time 1415   PT Time Calculation (min) 45 min   Activity Tolerance Patient tolerated treatment well   Behavior During Therapy [facility] for tasks assessed/performed      Past Medical History:  Diagnosis Date  . At risk for sudden cardiac death June 15, 2013  . Cataract   . Chronic anticoagulation 05/2013   started  . Chronic kidney disease    Patient reports he is seeing Kidney doctor in September  . Coronary artery disease   . Diabetes mellitus    type 2   . Dyslipidemia   . GERD (gastroesophageal reflux disease)   . History of valve replacement 2010   [facility] Ease bioprosthetic 36mm  . Hypertension   . PAF (paroxysmal atrial fibrillation) ([place]) 05/2013   converted with amiodarone to SR  . S/P CABG x 3 2010  . S/P cardiac catheterization, Rt & Lt heart cath 06/05/2013 06-15-2013  . Stroke [facility]) 2003   R-sided weakness & upper extremity swelling   . Thyroid disease     Past Surgical History:  Procedure Laterality Date  . AORTIC VALVE REPLACEMENT  01/26/2009   [facility] Ease bioprosthetic 86mm valve  . CARDIAC CATHETERIZATION  06/05/2013   native 3 vessel disease; patent LIMA to LAD, SVG to OM and SVG to PDA; Elevated right and left heart filling pressures, with predominantly pulmonary venous hypertension, normal PVR  . COLONOSCOPY  2007   Dr. Aviva Signs: internal hemorrhoids  . COLONOSCOPY N/A 06/16/2016    Procedure: COLONOSCOPY;  Surgeon: Rogene Houston, MD;  Location: [facility] ENDO SUITE;  Service: Endoscopy;  Laterality: N/A;  1:45  . CORONARY ARTERY BYPASS GRAFT  01/26/2009   LIMA to LAD, SVG to circumflex, SVG to PDA  . GRAFT(S) ANGIOGRAM  06/05/2013   Procedure: GRAFT(S) Cyril Loosen;  Surgeon: Leonie Man, MD;  Location: [facility] CATH LAB;  Service: Cardiovascular;;  . IR ANGIOGRAM EXTREMITY LEFT  02/10/2017  . IR ANGIOGRAM EXTREMITY LEFT  02/27/2017  . IR FEM POP ART ATHERECT INC PTA MOD SED  02/10/2017  . IR GENERIC HISTORICAL  05/12/2016   IR RADIOLOGIST EVAL & MGMT 05/12/2016 Jacqulynn Cadet, MD [facility] INTERV RAD  . IR RADIOLOGIST EVAL & MGMT  01/04/2017  . IR US GUIDE VASC ACCESS LEFT  02/27/2017  . IR US GUIDE VASC ACCESS LEFT  02/27/2017  . IR US GUIDE VASC ACCESS RIGHT  02/10/2017  . LEFT AND RIGHT HEART CATHETERIZATION WITH CORONARY ANGIOGRAM N/A 06/05/2013   Procedure: LEFT AND RIGHT HEART CATHETERIZATION WITH CORONARY ANGIOGRAM;  Surgeon: Leonie Man, MD;  Location: [facility] CATH LAB;  Service: Cardiovascular;  Laterality: N/A;  . TRANSTHORACIC ECHOCARDIOGRAM  09/2011   grade 1 diastolic dysfunction; increasing valve gradient; calcified MV annulus   . TRANSTHORACIC ECHOCARDIOGRAM  06/03/2013   EF 25-30%, mod conc. hypertrophy, grade 3 diastolic dysfunction; LA mod dilated; calcified MV  annulus; transaortic gradients are normal for the bioprosthetic valve; inf vena cava dilated (elevated CVP) - LifeVest    There were no vitals filed for this visit.        [facility] PT Assessment - 04/03/17 0001      Assessment   Medical Diagnosis diabetic ulcer right foot    Referring Provider Lawerence Fusco   Onset Date/Surgical Date 01/13/17   Next MD Visit unknown   Prior Therapy hospitalized, IV antibiotics, home heatlh.     Precautions   Precautions Other (comment)  cellulitis     Restrictions   Weight Bearing Restrictions No     Balance Screen   Has the patient fallen in the past 6 months No    Has the patient had a decrease in activity level because of a fear of falling?  Yes   Is the patient reluctant to leave their home because of a fear of falling?  No     Prior Function   Level of Independence Independent with community mobility with device   Vocation Retired     Cognition   Overall Cognitive Status Within Functional Limits for tasks assessed         Wound Therapy - 04/03/17 1435    Subjective Pt states tha he ended up in the hospital.  He now has a wound on his right foot due to trying to take the weight off of his left foot.    Patient and Family Stated Goals wound to heal    Date of Onset 01/13/17   Prior Treatments self care    Pain Assessment 0-10   Pain Score 10-Worst pain ever   Pain Type Chronic pain   Pain Location Foot   Pain Orientation Right;Lateral   Pain Descriptors / Indicators Throbbing   Pain Onset With Activity   Patients Stated Pain Goal 0   Pain Intervention(s) Elevated extremity;Emotional support   Multiple Pain Sites No   Evaluation and Treatment Procedures Explained to Patient/Family Yes   Evaluation and Treatment Procedures agreed to   Wound Properties Date First Assessed: 01/17/17 Time First Assessed: 0814 Wound Type: Non-pressure wound   Dressing Type Gauze (Comment)   Dressing Changed Changed   Dressing Status None   Dressing Change Frequency Monday, Wednesday, Friday  for 2 weeks thane 2x a week for 6 weeks.    Site / Wound Assessment Black;Painful   % Wound base Red or Granulating 0%   % Wound base Black/Eschar 100%   Peri-wound Assessment Intact   Wound Length (cm) 3.7 cm   Wound Width (cm) 4.2 cm   Wound Depth (cm) 1.5 cm   Undermining (cm) 1.5  undermines from in all directions.   Drainage Amount Moderate  once therapist removed the black eschar.   Drainage Description Purulent;No odor   Treatment Cleansed;Debridement (Selective);Hydrotherapy (Pulse lavage)   Pulsed lavage therapy - wound location entire wound     Pulsed Lavage with Suction (psi) 8 psi   Pulsed Lavage with Suction - Normal Saline Used 1000 mL   Pulsed Lavage Tip Tip with splash shield   Selective Debridement - Location 5th MTP   Selective Debridement - Tools Used Forceps;Scissors   Selective Debridement - Tissue Removed slough    Wound Therapy - Clinical Statement Pt had been being seen for a nonhealing wound on his left foot which caused him to shift his weight to his right foot.  He now has a wound on the lateral aspect of his right metatarsal.  The whole area is covered in black eschar and was mushy to the feel.  The therapist began debridement when thick purulent pus emerged from the area.  Pt was urged to go to the ER but refused stating that if we feel he needs to go next treatment he will.    Wound Therapy - Functional Problem List difficulty walking    Factors Delaying/Impairing Wound Healing Altered sensation;Diabetes Mellitus;Infection - systemic/local;Immobility;Multiple medical problems;Polypharmacy;Vascular compromise   Hydrotherapy Plan Debridement;Dressing change;Patient/family education;Pulsatile lavage with suction      Wound Therapy - Current Recommendations PT   Wound Plan See pt 3x a week for the first 2 weeks then decrease to 2 x a week for 6 weeks.    Dressing  silverhydrofiber, 2x2, kling and netting.             Objective measurements completed on examination: See above findings.                  PT Short Term Goals - 04/03/17 1449      PT SHORT TERM GOAL #1   Title Pt wound on his right foot to be 80% granulated to decrease risk of infection   Time 3   Period Weeks   Status New   Target Date 04/24/17     PT SHORT TERM GOAL #2   Title Pt wound depth on his right foot  to be no greater than one centimeter to show signs of healing    Time 3   Period Weeks     PT SHORT TERM GOAL #3   Title Pt undermining in his Rt foot wound  to be no greater than on centimeter to show signs of  healing.l    Time 3   Period Weeks   Status New           PT Long Term Goals - 04/03/17 1452      PT LONG TERM GOAL #1   Title PT right foot wound to be 100% granulated to decrease risk of infection.    Time 6   Period Weeks   Status New   Target Date 05/15/17     PT LONG TERM GOAL #2   Title Pt wound on his Right foot depth to be no greater than .2 cm to decrease risk of infection.    Time 6   Period Weeks   Status New   Target Date 05/15/17     PT LONG TERM GOAL #3   Title Pt wound to no longer be undermining.    Time 8   Period Weeks   Target Date 05/29/17     PT LONG TERM GOAL #4   Title PT wound size to be no greater than .3x .5 and have no depth to allow pt to care for wound at home.    Time 8   Period Weeks   Status New              Plan - 04/03/17 1426    Rehab Potential Fair   PT Frequency 2x / week   PT Duration 8 weeks   PT Treatment/Interventions ADLs/Self Care Home Management;Other (comment)  debridement and dressing change.    PT Next Visit Plan Continue with debridement and dresing change.      Patient will benefit from skilled therapeutic intervention in order to improve the following deficits and impairments:  Abnormal gait, Decreased activity tolerance, Decreased balance, Other (comment), Pain, Difficulty walking (nonhealing wound.)  Visit  Diagnosis: Difficulty in walking, not elsewhere classified  Localized edema  Pain in right foot  Diabetic ulcer of right foot associated with diabetes mellitus due to underlying condition, with necrosis of muscle, unspecified part of foot ([place])      G-Codes - Apr 08, 2017 1457    Functional Assessment Tool Used (Outpatient Only) granulation 0%; undermining    Functional Limitation Other PT primary   Other PT Primary Current Status (W9798) 100 percent impaired, limited or restricted   Other PT Primary Goal Status (X2119) At least 20 percent but less than 40 percent impaired, limited or restricted       Problem List Patient Active Problem List   Diagnosis Date Noted  . Osteomyelitis due to type 2 diabetes mellitus ([place])   . Diabetic foot infection ([place]) 02/07/2017  . Type 2 diabetes mellitus with left diabetic foot infection ([place]) 02/07/2017  . Uncontrolled type 2 diabetes mellitus with hyperglycemia, with long-term current use of insulin ([place]) 02/07/2017  . Cellulitis of left foot 12/12/2016  . Nausea & vomiting 02/24/2016  . A-fib ([place]) 02/23/2016  . Hypothyroidism 02/23/2016  . CKD (chronic kidney disease), stage III 02/23/2016  . Anxiety 02/23/2016  . Chronic combined systolic (congestive) and diastolic (congestive) heart failure ([place]) 02/23/2016  . Dizziness 02/23/2016  . Open wound of right foot 02/23/2016  . Rectal bleeding 07/28/2015  . Chronic anticoagulation 07/28/2015  . PVD (peripheral vascular disease) ([place])   . S/P angioplasty 04/23/2015  . Peripheral arterial occlusive disease ([place]) 04/23/2015  . PAD (peripheral artery disease) ([place])   . Critical lower limb ischemia   . Toe ulcer, right ([place])   . Type II or unspecified type diabetes mellitus without mention of complication, uncontrolled 11/27/2013  . Acute respiratory failure with hypoxia ([place]) 11/26/2013  . CAP (community acquired pneumonia) 11/26/2013  . CHF exacerbation ([place]) 11/25/2013  . Cardiomyopathy, ischemic 06/13/2013  . At risk for sudden cardiac death Jun 12, 2013  . S/P cardiac catheterization, Rt & Lt heart cath 06/05/2013 12-Jun-2013  . Acute on chronic combined systolic and diastolic HF (heart failure), NYHA class 3 ([place]) 06/04/2013  . Atrial flutter with rapid ventricular response ([place]) 06/02/2013  . Acute renal failure: Cr up to 1.8 06/02/2013  . Hyperkalemia 06/02/2013    Class: Acute  . LBBB (left bundle branch block) 06/01/2013  . Hypotension- secondary to AF and Diltiazem Rx 06/01/2013  . Obesity (BMI 30-39.9) 06/01/2013  . Acute on chronic diastolic heart failure - due to Afib AVR  06/01/2013  . Atrial fibrillation with RVR- converted with Amiodarone 05/31/2013  . Stroke- Lt brain 2003 04/08/2013  . Hemiplegia affecting right dominant side ([place]) 04/08/2013  . Cataract 04/08/2013  . GERD (gastroesophageal reflux disease) 04/08/2013  . S/P CABG x 3 - 2010 04/08/2013  . S/P AVR-tissue, 2010 04/08/2013  . DM2 (diabetes mellitus, type 2) ([place]) 04/08/2013  . Dyslipidemia 04/08/2013  . SUBACROMIAL BURSITIS, LEFT 11/05/2009   Rayetta Humphrey, PT CLT [phone] 04/08/2017, 2:58 PM  [facility] [address] [place], [name], [address] Phone: [phone]   Fax:  [phone]  Name: [name] MRN: [id number] Date of Birth: 02/12/1946

## 2017-04-05 ENCOUNTER — Ambulatory Visit (HOSPITAL_COMMUNITY): Payer: Medicare Other

## 2017-04-05 ENCOUNTER — Emergency Department (HOSPITAL_COMMUNITY)
Admission: EM | Admit: 2017-04-05 | Discharge: 2017-04-05 | Disposition: A | Discharge status: Home / Self Care | Payer: Medicare Other | Attending: Emergency Medicine | Admitting: Emergency Medicine

## 2017-04-05 ENCOUNTER — Emergency Department (HOSPITAL_COMMUNITY): Payer: Medicare Other

## 2017-04-05 ENCOUNTER — Encounter (HOSPITAL_COMMUNITY): Payer: Self-pay | Admitting: Emergency Medicine

## 2017-04-05 DIAGNOSIS — Z87891 Personal history of nicotine dependence: Secondary | ICD-10-CM | POA: Insufficient documentation

## 2017-04-05 DIAGNOSIS — I110 Hypertensive heart disease with heart failure: Secondary | ICD-10-CM | POA: Diagnosis not present

## 2017-04-05 DIAGNOSIS — T148XXA Other injury of unspecified body region, initial encounter: Secondary | ICD-10-CM

## 2017-04-05 DIAGNOSIS — Y939 Activity, unspecified: Secondary | ICD-10-CM | POA: Insufficient documentation

## 2017-04-05 DIAGNOSIS — I129 Hypertensive chronic kidney disease with stage 1 through stage 4 chronic kidney disease, or unspecified chronic kidney disease: Secondary | ICD-10-CM | POA: Diagnosis not present

## 2017-04-05 DIAGNOSIS — E039 Hypothyroidism, unspecified: Secondary | ICD-10-CM | POA: Insufficient documentation

## 2017-04-05 DIAGNOSIS — Z7901 Long term (current) use of anticoagulants: Secondary | ICD-10-CM | POA: Diagnosis not present

## 2017-04-05 DIAGNOSIS — E08621 Diabetes mellitus due to underlying condition with foot ulcer: Secondary | ICD-10-CM

## 2017-04-05 DIAGNOSIS — N183 Chronic kidney disease, stage 3 (moderate): Secondary | ICD-10-CM | POA: Diagnosis not present

## 2017-04-05 DIAGNOSIS — M79671 Pain in right foot: Secondary | ICD-10-CM

## 2017-04-05 DIAGNOSIS — L97529 Non-pressure chronic ulcer of other part of left foot with unspecified severity: Secondary | ICD-10-CM | POA: Diagnosis not present

## 2017-04-05 DIAGNOSIS — I2510 Atherosclerotic heart disease of native coronary artery without angina pectoris: Secondary | ICD-10-CM | POA: Insufficient documentation

## 2017-04-05 DIAGNOSIS — S91301A Unspecified open wound, right foot, initial encounter: Secondary | ICD-10-CM | POA: Diagnosis present

## 2017-04-05 DIAGNOSIS — Y999 Unspecified external cause status: Secondary | ICD-10-CM | POA: Insufficient documentation

## 2017-04-05 DIAGNOSIS — R262 Difficulty in walking, not elsewhere classified: Secondary | ICD-10-CM

## 2017-04-05 DIAGNOSIS — X58XXXA Exposure to other specified factors, initial encounter: Secondary | ICD-10-CM | POA: Insufficient documentation

## 2017-04-05 DIAGNOSIS — E11621 Type 2 diabetes mellitus with foot ulcer: Secondary | ICD-10-CM | POA: Insufficient documentation

## 2017-04-05 DIAGNOSIS — Z79899 Other long term (current) drug therapy: Secondary | ICD-10-CM | POA: Diagnosis not present

## 2017-04-05 DIAGNOSIS — L089 Local infection of the skin and subcutaneous tissue, unspecified: Secondary | ICD-10-CM | POA: Diagnosis not present

## 2017-04-05 DIAGNOSIS — R600 Localized edema: Secondary | ICD-10-CM | POA: Diagnosis not present

## 2017-04-05 DIAGNOSIS — I5042 Chronic combined systolic (congestive) and diastolic (congestive) heart failure: Secondary | ICD-10-CM | POA: Insufficient documentation

## 2017-04-05 DIAGNOSIS — Y929 Unspecified place or not applicable: Secondary | ICD-10-CM | POA: Insufficient documentation

## 2017-04-05 DIAGNOSIS — E1169 Type 2 diabetes mellitus with other specified complication: Secondary | ICD-10-CM | POA: Insufficient documentation

## 2017-04-05 DIAGNOSIS — E11628 Type 2 diabetes mellitus with other skin complications: Secondary | ICD-10-CM

## 2017-04-05 DIAGNOSIS — E1122 Type 2 diabetes mellitus with diabetic chronic kidney disease: Secondary | ICD-10-CM | POA: Insufficient documentation

## 2017-04-05 DIAGNOSIS — Z794 Long term (current) use of insulin: Secondary | ICD-10-CM | POA: Diagnosis not present

## 2017-04-05 DIAGNOSIS — L97509 Non-pressure chronic ulcer of other part of unspecified foot with unspecified severity: Secondary | ICD-10-CM | POA: Diagnosis not present

## 2017-04-05 DIAGNOSIS — L97513 Non-pressure chronic ulcer of other part of right foot with necrosis of muscle: Secondary | ICD-10-CM

## 2017-04-05 LAB — CBC WITH DIFFERENTIAL/PLATELET
BASOS PCT: 0 %
Basophils Absolute: 0 10*3/uL (ref 0.0–0.1)
EOS ABS: 0.1 10*3/uL (ref 0.0–0.7)
Eosinophils Relative: 1 %
HEMATOCRIT: 38.8 % — AB (ref 39.0–52.0)
Hemoglobin: 12.4 g/dL — ABNORMAL LOW (ref 13.0–17.0)
Lymphocytes Relative: 15 %
Lymphs Abs: 1.7 10*3/uL (ref 0.7–4.0)
MCH: 26.6 pg (ref 26.0–34.0)
MCHC: 32 g/dL (ref 30.0–36.0)
MCV: 83.3 fL (ref 78.0–100.0)
MONO ABS: 0.9 10*3/uL (ref 0.1–1.0)
MONOS PCT: 8 %
NEUTROS ABS: 8.6 10*3/uL — AB (ref 1.7–7.7)
Neutrophils Relative %: 76 %
Platelets: 293 10*3/uL (ref 150–400)
RBC: 4.66 MIL/uL (ref 4.22–5.81)
RDW: 13.5 % (ref 11.5–15.5)
WBC: 11.2 10*3/uL — ABNORMAL HIGH (ref 4.0–10.5)

## 2017-04-05 LAB — C-REACTIVE PROTEIN: CRP: 4.9 mg/dL — AB (ref <1.0)

## 2017-04-05 LAB — BASIC METABOLIC PANEL
Anion gap: 10 (ref 5–15)
BUN: 21 mg/dL — ABNORMAL HIGH (ref 6–20)
CHLORIDE: 100 mmol/L — AB (ref 101–111)
CO2: 26 mmol/L (ref 22–32)
Calcium: 9.1 mg/dL (ref 8.9–10.3)
Creatinine, Ser: 1.63 mg/dL — ABNORMAL HIGH (ref 0.61–1.24)
GFR calc non Af Amer: 42 mL/min — ABNORMAL LOW (ref >60)
GFR, EST AFRICAN AMERICAN: 48 mL/min — AB (ref >60)
GLUCOSE: 281 mg/dL — AB (ref 65–99)
POTASSIUM: 4.1 mmol/L (ref 3.5–5.1)
Sodium: 136 mmol/L (ref 135–145)

## 2017-04-05 LAB — CBG MONITORING, ED: Glucose-Capillary: 260 mg/dL — ABNORMAL HIGH (ref 65–99)

## 2017-04-05 LAB — SEDIMENTATION RATE: SED RATE: 65 mm/h — AB (ref 0–16)

## 2017-04-05 MED ORDER — DOXYCYCLINE HYCLATE 100 MG PO CAPS: 100.0000 mg | ORAL_CAPSULE | Freq: Two times a day (BID) | ORAL | 0 refills | Status: AC

## 2017-04-05 MED ORDER — HYDROCODONE-ACETAMINOPHEN 5-325 MG PO TABS: 1.0000 | ORAL_TABLET | ORAL | 0 refills | Status: DC | PRN

## 2017-04-05 NOTE — ED Triage Notes (Signed)
Pt has been seeing wound clinic x 2 days for wound on right foot that pt states is black. States clinic has been getting a lot of pus out but sent here today bc it was black per pt.

## 2017-04-05 NOTE — ED Notes (Signed)
Wound to right foot dressed with telfa, guaze, and kerlix-tolerated well.

## 2017-04-05 NOTE — Discharge Instructions (Signed)
You have an infection of the ulcer of the right foot. Our ID doctor recommending adding doxycycline to the Augmentin you are taking. Please follow-up with your ID doctor, Dr. Megan Salon for close follow-up. If you are unable to get a close appointment, please also follow-up with Dr. Aline Brochure form orthopedic surgery and your PCP  Return for worsening symptoms, including fever, confusion, worsening redness/swelling, or any other symptoms concerning to you.

## 2017-04-05 NOTE — ED Provider Notes (Addendum)
[facility] DEPT Provider Note   CSN: [id number] Arrival date & time: 04/05/17  1445     History   Chief Complaint Chief Complaint  Patient presents with  . Wound Infection    HPI [name] is a 71 y.o. male.  HPI 71 year old male who presents with wound infection. History of prior CVA, CAD status post CABG, diabetes, hypertension, hyperlipidemia, peripheral vascular disease, atrial fibrillation. Onset from wound clinic for evaluation of a right foot ulcer that has been nonhealing. He was previously hospitalized in July 2018 for osteomyelitis and cellulitis of the left foot, and initially was receiving ertapenem, with improvement in symptoms. Now currently taking Augmentin. During his hospitalization, he did receive revascularization of the left lower extremity.  Today was sent to the ED due to necrotic tissue around the right foot ulcer. He has not had fevers, chills, night sweats, nausea or vomiting, chest pain difficulty breathing or abdominal pain. Has had increased pain to the right foot. Is unsure of how much worsening redness and swelling is.  No alleviating or aggravating factors.  Past Medical History:  Diagnosis Date  . At risk for sudden cardiac death 06/16/13  . Cataract   . Chronic anticoagulation 05/2013   started  . Chronic kidney disease    Patient reports he is seeing Kidney doctor in September  . Coronary artery disease   . Diabetes mellitus    type 2   . Dyslipidemia   . GERD (gastroesophageal reflux disease)   . History of valve replacement 2010   [facility] Ease bioprosthetic 45mm  . Hypertension   . PAF (paroxysmal atrial fibrillation) ([place]) 05/2013   converted with amiodarone to SR  . S/P CABG x 3 2010  . S/P cardiac catheterization, Rt & Lt heart cath 06/05/2013 2013/06/16  . Stroke [facility]) 2003   R-sided weakness & upper extremity swelling   . Thyroid disease     Patient Active Problem List   Diagnosis Date Noted  .  Osteomyelitis due to type 2 diabetes mellitus ([place])   . Diabetic foot infection ([place]) 02/07/2017  . Type 2 diabetes mellitus with left diabetic foot infection ([place]) 02/07/2017  . Uncontrolled type 2 diabetes mellitus with hyperglycemia, with long-term current use of insulin ([place]) 02/07/2017  . Cellulitis of left foot 12/12/2016  . Nausea & vomiting 02/24/2016  . A-fib ([place]) 02/23/2016  . Hypothyroidism 02/23/2016  . CKD (chronic kidney disease), stage III 02/23/2016  . Anxiety 02/23/2016  . Chronic combined systolic (congestive) and diastolic (congestive) heart failure ([place]) 02/23/2016  . Dizziness 02/23/2016  . Open wound of right foot 02/23/2016  . Rectal bleeding 07/28/2015  . Chronic anticoagulation 07/28/2015  . PVD (peripheral vascular disease) ([place])   . S/P angioplasty 04/23/2015  . Peripheral arterial occlusive disease ([place]) 04/23/2015  . PAD (peripheral artery disease) ([place])   . Critical lower limb ischemia   . Toe ulcer, right ([place])   . Type II or unspecified type diabetes mellitus without mention of complication, uncontrolled 11/27/2013  . Acute respiratory failure with hypoxia ([place]) 11/26/2013  . CAP (community acquired pneumonia) 11/26/2013  . CHF exacerbation ([place]) 11/25/2013  . Cardiomyopathy, ischemic 06/13/2013  . At risk for sudden cardiac death 06-16-13  . S/P cardiac catheterization, Rt & Lt heart cath 06/05/2013 06-16-13  . Acute on chronic combined systolic and diastolic HF (heart failure), NYHA class 3 ([place]) 06/04/2013  . Atrial flutter with rapid ventricular response ([place]) 06/02/2013  . Acute renal failure: Cr up to 1.8  06/02/2013  . Hyperkalemia 06/02/2013    Class: Acute  . LBBB (left bundle branch block) 06/01/2013  . Hypotension- secondary to AF and Diltiazem Rx 06/01/2013  . Obesity (BMI 30-39.9) 06/01/2013  . Acute on chronic diastolic heart failure - due to Afib AVR 06/01/2013  . Atrial fibrillation with RVR- converted with Amiodarone 05/31/2013   . Stroke- Lt brain 2003 04/08/2013  . Hemiplegia affecting right dominant side ([place]) 04/08/2013  . Cataract 04/08/2013  . GERD (gastroesophageal reflux disease) 04/08/2013  . S/P CABG x 3 - 2010 04/08/2013  . S/P AVR-tissue, 2010 04/08/2013  . DM2 (diabetes mellitus, type 2) ([place]) 04/08/2013  . Dyslipidemia 04/08/2013  . SUBACROMIAL BURSITIS, LEFT 11/05/2009    Past Surgical History:  Procedure Laterality Date  . AORTIC VALVE REPLACEMENT  01/26/2009   [facility] Ease bioprosthetic 70mm valve  . CARDIAC CATHETERIZATION  06/05/2013   native 3 vessel disease; patent LIMA to LAD, SVG to OM and SVG to PDA; Elevated right and left heart filling pressures, with predominantly pulmonary venous hypertension, normal PVR  . COLONOSCOPY  2007   Dr. Aviva Signs: internal hemorrhoids  . COLONOSCOPY N/A 06/16/2016   Procedure: COLONOSCOPY;  Surgeon: Rogene Houston, MD;  Location: [facility] ENDO SUITE;  Service: Endoscopy;  Laterality: N/A;  1:45  . CORONARY ARTERY BYPASS GRAFT  01/26/2009   LIMA to LAD, SVG to circumflex, SVG to PDA  . GRAFT(S) ANGIOGRAM  06/05/2013   Procedure: GRAFT(S) Cyril Loosen;  Surgeon: Leonie Man, MD;  Location: [facility] CATH LAB;  Service: Cardiovascular;;  . IR ANGIOGRAM EXTREMITY LEFT  02/10/2017  . IR ANGIOGRAM EXTREMITY LEFT  02/27/2017  . IR FEM POP ART ATHERECT INC PTA MOD SED  02/10/2017  . IR GENERIC HISTORICAL  05/12/2016   IR RADIOLOGIST EVAL & MGMT 05/12/2016 Jacqulynn Cadet, MD [facility] INTERV RAD  . IR RADIOLOGIST EVAL & MGMT  01/04/2017  . IR US GUIDE VASC ACCESS LEFT  02/27/2017  . IR US GUIDE VASC ACCESS LEFT  02/27/2017  . IR US GUIDE VASC ACCESS RIGHT  02/10/2017  . LEFT AND RIGHT HEART CATHETERIZATION WITH CORONARY ANGIOGRAM N/A 06/05/2013   Procedure: LEFT AND RIGHT HEART CATHETERIZATION WITH CORONARY ANGIOGRAM;  Surgeon: Leonie Man, MD;  Location: [facility] CATH LAB;  Service: Cardiovascular;  Laterality: N/A;  . TRANSTHORACIC ECHOCARDIOGRAM  09/2011   grade 1  diastolic dysfunction; increasing valve gradient; calcified MV annulus   . TRANSTHORACIC ECHOCARDIOGRAM  06/03/2013   EF 25-30%, mod conc. hypertrophy, grade 3 diastolic dysfunction; LA mod dilated; calcified MV annulus; transaortic gradients are normal for the bioprosthetic valve; inf vena cava dilated (elevated CVP) - LifeVest       Home Medications    Prior to Admission medications   Medication Sig Start Date End Date Taking? Authorizing Provider  oxyCODONE-acetaminophen (PERCOCET/ROXICET) 5-325 MG tablet Take 1 tablet by mouth 2 (two) times daily.   Yes [provider]  acetaminophen (TYLENOL) 500 MG tablet Take 1,000 mg by mouth 2 (two) times daily as needed for headache. For pain/headaches    [provider]  alprazolam Duanne Moron) 2 MG tablet Take 1 mg by mouth 3 (three) times daily.     [provider]  amiodarone (PACERONE) 200 MG tablet Take 1 tablet (200 mg total) by mouth daily. 10/14/16   Hilty, Nadean Corwin, MD  amoxicillin-clavulanate (AUGMENTIN) 500-125 MG tablet Take 1 tablet (500 mg total) by mouth 2 (two) times daily. 03/20/17   Michel Bickers, MD  apixaban Arne [place]) 5  MG TABS tablet Take 1 tablet (5 mg total) by mouth 2 (two) times daily. 10/14/16   Hilty, Nadean Corwin, MD  atorvastatin (LIPITOR) 20 MG tablet Take 1 tablet (20 mg total) by mouth daily at 6 PM. 06/18/16   Rehman, Mechele Dawley, MD  doxycycline (VIBRAMYCIN) 100 MG capsule Take 1 capsule (100 mg total) by mouth 2 (two) times daily. 04/05/17   Forde Dandy, MD  fenofibrate (TRICOR) 145 MG tablet Take 1 tablet (145 mg total) by mouth daily. 03/20/17   Hilty, Nadean Corwin, MD  ferrous sulfate 325 (65 FE) MG EC tablet Take 325 mg by mouth daily with breakfast.    [provider]  gabapentin (NEURONTIN) 300 MG capsule Take 300 mg by mouth 2 (two) times daily.     [provider]  insulin glargine (LANTUS) 100 UNIT/ML injection Inject 0.5 mLs (50 Units total) into the skin 2 (two) times daily. 75  units in the morning then 60 units at bedtime 02/14/17   Patrecia Pour, Christean Grief, MD  insulin lispro (HUMALOG) 100 UNIT/ML injection Inject 0.08 mLs (8 Units total) into the skin 3 (three) times daily with meals. 02/14/17 03/16/17  Patrecia Pour, Christean Grief, MD  INVANZ 1 g injection  03/18/17   [provider]  INVOKANA 100 MG TABS tablet Take 1 tablet by mouth daily. 12/09/16   [provider]  levothyroxine (SYNTHROID, LEVOTHROID) 25 MCG tablet Take 25 mcg by mouth daily before breakfast.    [provider]  meclizine (ANTIVERT) 25 MG tablet Take 25 mg by mouth 3 (three) times daily as needed for dizziness.    [provider]  metoprolol succinate (TOPROL-XL) 25 MG 24 hr tablet Take 1 tablet (25 mg total) by mouth daily. 10/14/16   Hilty, Nadean Corwin, MD  Omega-3 Fatty Acids (FISH OIL) 1200 MG CAPS Take 1 capsule by mouth daily.     [provider]  pantoprazole (PROTONIX) 40 MG tablet Take 1 tablet (40 mg total) by mouth 2 (two) times daily. 10/30/14   Hilty, Nadean Corwin, MD  silver sulfADIAZINE (SILVADENE) 1 % cream Apply 1 application topically daily.    [provider]  sodium chloride 0.9 % infusion  03/18/17   [provider]  torsemide (DEMADEX) 10 MG tablet Take 1 tablet (10 mg total) by mouth daily. 10/14/16   Hilty, Nadean Corwin, MD    Family History Family History  Problem Relation Age of Onset  . Heart attack Mother   . Liver disease Brother   . Diabetes Sister        x4    Social History Social History  Substance Use Topics  . Smoking status: Former Smoker    Types: Cigarettes    Quit date: 08/15/2001  . Smokeless tobacco: Former Systems developer    Types: Chew     Comment: Quit in 2003  . Alcohol use No     Allergies   Patient has no known allergies.   Review of Systems Review of Systems  Constitutional: Negative for fever.  Respiratory: Negative for shortness of breath.   Cardiovascular: Negative for chest pain.  Skin: Positive for wound.    All other systems reviewed and are negative.    Physical Exam Updated Vital Signs BP (!) 152/67 (BP Location: Right Arm)   Pulse 84   Temp 97.8 F (36.6 C) (Oral)   Resp 20   SpO2 97%   Physical Exam Physical Exam  Nursing note and vitals reviewed. Constitutional: Well developed,  well nourished, non-toxic, and in no acute distress Head: Normocephalic and atraumatic.  Mouth/Throat: Oropharynx is clear and moist.  Neck: Normal range of motion. Neck supple.  Cardiovascular: Normal rate and regular rhythm.   +2 DP pulses Pulmonary/Chest: Effort normal and breath sounds normal.  Abdominal: Soft. There is no tenderness. There is no rebound and no guarding.  Musculoskeletal: Normal range of motion.  Neurological: Alert, no facial droop, fluent speech, moves all extremities symmetrically Skin: Skin is warm and dry.  there is large 3 x 4 cm ulcerated lesion over the right lateral foot, with necrotic wound edge, with surrounding erythema and soft tissue swelling of the 5th digit. No active drainage. Psychiatric: Cooperative       ED Treatments / Results  Labs (all labs ordered are listed, but only abnormal results are displayed) Labs Reviewed  CBC WITH DIFFERENTIAL/PLATELET - Abnormal; Notable for the following:       Result Value   WBC 11.2 (*)    Hemoglobin 12.4 (*)    HCT 38.8 (*)    Neutro Abs 8.6 (*)    All other components within normal limits  BASIC METABOLIC PANEL - Abnormal; Notable for the following:    Chloride 100 (*)    Glucose, Bld 281 (*)    BUN 21 (*)    Creatinine, Ser 1.63 (*)    GFR calc non Af Amer 42 (*)    GFR calc Af Amer 48 (*)    All other components within normal limits  SEDIMENTATION RATE - Abnormal; Notable for the following:    Sed Rate 65 (*)    All other components within normal limits  CBG MONITORING, ED - Abnormal; Notable for the following:    Glucose-Capillary 260 (*)    All other components within normal limits  C-REACTIVE PROTEIN     EKG  EKG Interpretation None       Radiology Dg Foot Complete Right  Result Date: 04/05/2017 CLINICAL DATA:  Lateral foot ulcer. EXAM: RIGHT FOOT COMPLETE - 3+ VIEW COMPARISON:  None. FINDINGS: There is a soft tissue ulcer over the fifth metatarsal head. The underlying bone appears mottled on the AP view but this is considered projectional given the oblique view. No fracture deformity or joint erosion. Chronic appearing deformity of the great toe tuft with overlying soft tissue flattening. No acute fracture or dislocation. Small plantar heel spur. IMPRESSION: Lateral foot ulcer over the fifth metatarsal head. No definitive osteomyelitis. No opaque foreign body or tracking soft tissue gas. Electronically Signed   By: Monte Fantasia M.D.   On: 04/05/2017 15:59    Procedures Procedures (including critical care time)  Medications Ordered in ED Medications - No data to display   Initial Impression / Assessment and Plan / ED Course  I have reviewed the triage vital signs and the nursing notes.  Pertinent labs & imaging results that were available during my care of the patient were reviewed by me and considered in my medical decision making (see chart for details).     71 year old male who presents with concern for diabetic wound infection. Afebrile and hemodynamically stable. Well-appearing and in no acute distress. Left foot, still has some evidence of erythema and swelling, but on review of last ID note from Dr. Megan Salon earlier this month, and has been improving which is why he was switched to Augmentin. Right foot has ulceration that appears to have some evidence of early cellulitis. X-ray visualized and shows no underlying osteomyelitis. Blood work is  reassuring with minimal leukocytosis of 11.  I discussed this with Dr. Megan Salon from infectious disease. He recommended adding on oral doxycycline to his Augmentin for treatment. At this time, there are no systemic signs or symptoms  of illness I do not feel he requires acute admission. However I did discuss with Dr. Megan Salon, and we knowledge that there is likelihood that he may feel this antibiotic trial.  Patient will arrange close follow-up with ID. He will also follow up closely with primary care doctor. Orthopedic surgery referral to Dr. Aline Brochure also given. I discussed that he must return to the ED for any worsening symptoms, including fever confusion, worsening redness swelling, drainage or any other symptoms concerning to him. He expressed understanding of all discharge instructions, and he felt comfortable with the plan of care.  Final Clinical Impressions(s) / ED Diagnoses   Final diagnoses:  Wound infection  Diabetic foot infection ([place])    New Prescriptions New Prescriptions   DOXYCYCLINE (VIBRAMYCIN) 100 MG CAPSULE    Take 1 capsule (100 mg total) by mouth 2 (two) times daily.     Forde Dandy, MD 04/05/17 1649    Forde Dandy, MD 04/05/17 [phone]

## 2017-04-05 NOTE — Therapy (Signed)
[place], [name], [address] Phone: [phone]   Fax:  [phone]  Wound Care Therapy  Patient Details  Name: [name] MRN: [id number] Date of Birth: July 31, 1946 Referring Provider: Nicholes Mango  Encounter Date: 04/05/2017      PT End of Session - 04/05/17 1416    Visit Number 2   Number of Visits 16   Date for PT Re-Evaluation 05/03/17   Authorization Type [employer] medicare   Authorization - Visit Number 2   Authorization - Number of Visits 10   PT Start Time 1310   PT Stop Time 1353   PT Time Calculation (min) 43 min   Activity Tolerance Patient tolerated treatment well;No increased pain   Behavior During Therapy WFL for tasks assessed/performed      Past Medical History:  Diagnosis Date  . At risk for sudden cardiac death 07/02/13  . Cataract   . Chronic anticoagulation 05/2013   started  . Chronic kidney disease    Patient reports he is seeing Kidney doctor in September  . Coronary artery disease   . Diabetes mellitus    type 2   . Dyslipidemia   . GERD (gastroesophageal reflux disease)   . History of valve replacement 2010   [facility] Ease bioprosthetic 6mm  . Hypertension   . PAF (paroxysmal atrial fibrillation) ([place]) 05/2013   converted with amiodarone to SR  . S/P CABG x 3 2010  . S/P cardiac catheterization, Rt & Lt heart cath 06/05/2013 02-Jul-2013  . Stroke [facility]) 2003   R-sided weakness & upper extremity swelling   . Thyroid disease     Past Surgical History:  Procedure Laterality Date  . AORTIC VALVE REPLACEMENT  01/26/2009   [facility] Ease bioprosthetic 47mm valve  . CARDIAC CATHETERIZATION  06/05/2013   native 3 vessel disease; patent LIMA to LAD, SVG to OM and SVG to PDA; Elevated right and left heart filling pressures, with predominantly pulmonary venous hypertension, normal PVR  . COLONOSCOPY  2007   Dr. Aviva Signs: internal hemorrhoids  . COLONOSCOPY  N/A 06/16/2016   Procedure: COLONOSCOPY;  Surgeon: Rogene Houston, MD;  [facility];  Service: Endoscopy;  Laterality: N/A;  1:45  . CORONARY ARTERY BYPASS GRAFT  01/26/2009   LIMA to LAD, SVG to circumflex, SVG to PDA  . GRAFT(S) ANGIOGRAM  06/05/2013   Procedure: GRAFT(S) Cyril Loosen;  Surgeon: Leonie Man, MD;  Location: [facility] CATH LAB;  Service: Cardiovascular;;  . IR ANGIOGRAM EXTREMITY LEFT  02/10/2017  . IR ANGIOGRAM EXTREMITY LEFT  02/27/2017  . IR FEM POP ART ATHERECT INC PTA MOD SED  02/10/2017  . IR GENERIC HISTORICAL  05/12/2016   IR RADIOLOGIST EVAL & MGMT 05/12/2016 Jacqulynn Cadet, MD [facility] INTERV RAD  . IR RADIOLOGIST EVAL & MGMT  01/04/2017  . IR US GUIDE VASC ACCESS LEFT  02/27/2017  . IR US GUIDE VASC ACCESS LEFT  02/27/2017  . IR US GUIDE VASC ACCESS RIGHT  02/10/2017  . LEFT AND RIGHT HEART CATHETERIZATION WITH CORONARY ANGIOGRAM N/A 06/05/2013   Procedure: LEFT AND RIGHT HEART CATHETERIZATION WITH CORONARY ANGIOGRAM;  Surgeon: Leonie Man, MD;  Location: [facility] CATH LAB;  Service: Cardiovascular;  Laterality: N/A;  . TRANSTHORACIC ECHOCARDIOGRAM  09/2011   grade 1 diastolic dysfunction; increasing valve gradient; calcified MV annulus   . TRANSTHORACIC ECHOCARDIOGRAM  06/03/2013   EF 25-30%, mod conc. hypertrophy, grade 3 diastolic dysfunction; LA mod dilated;  calcified MV annulus; transaortic gradients are normal for the bioprosthetic valve; inf vena cava dilated (elevated CVP) - LifeVest    There were no vitals filed for this visit.       Subjective Assessment - 04/05/17 1400    Subjective Pt stated he has pain with weight bearing Rt foot.  Did not go to ER following recommendations last session.     Currently in Pain? Yes   Pain Score 5    Pain Location Foot   Pain Orientation Right;Lateral   Pain Descriptors / Indicators Throbbing   Pain Type Chronic pain                   Wound Therapy - 04/05/17 1400    Subjective Pt stated he has pain  with weight bearing Rt foot.  Did not go to ER following recommendations last session.     Patient and Family Stated Goals wound to heal    Date of Onset 01/13/17   Prior Treatments self care    Pain Assessment 0-10   Pain Onset With Activity   Patients Stated Pain Goal 0   Pain Intervention(s) Repositioned;Elevated extremity;Emotional support   Multiple Pain Sites No   Evaluation and Treatment Procedures Explained to Patient/Family Yes   Evaluation and Treatment Procedures agreed to   Wound Properties Date First Assessed: 01/17/17 Time First Assessed: 0814 Wound Type: Non-pressure wound   Dressing Type Silver hydrofiber;Gauze (Comment)   Dressing Changed Changed   Dressing Status None   Dressing Change Frequency Monday, Wednesday, Friday  first 2 weeks then reduce to 2x/ week x 6 weeks   Site / Wound Assessment Black;Painful   % Wound base Red or Granulating 5%   % Wound base Yellow/Fibrinous Exudate 80%   % Wound base Black/Eschar 15%   Peri-wound Assessment Intact   Drainage Amount Moderate  with removal of black eschar   Drainage Description Purulent;No odor   Treatment Cleansed;Debridement (Selective)   Pulsed lavage therapy - wound location entire wound    Pulsed Lavage with Suction (psi) 8 psi   Pulsed Lavage with Suction - Normal Saline Used 1000 mL   Pulsed Lavage Tip Tip with splash shield   Selective Debridement - Location 5th MTP   Selective Debridement - Tools Used Forceps;Scissors   Selective Debridement - Tissue Removed slough    Wound Therapy - Clinical Statement Pt arrived with reports he did not go to ER following recommendations last session.  Upon removal of dressings black eschar with redness surrounding wound bed and small opening and moderate puss drainage.  Able to removal significant amount of black eschar with noted black tissue underneath.  PLS complete for wound cleaning.  Continued with silver hydrofiber dressings with gauze.  Pt was strongly encouraged  to go to ER for IV antibiotics.     Wound Therapy - Functional Problem List difficulty walking    Factors Delaying/Impairing Wound Healing Altered sensation;Diabetes Mellitus;Infection - systemic/local;Immobility;Multiple medical problems;Polypharmacy;Vascular compromise   Hydrotherapy Plan Debridement;Dressing change;Patient/family education;Pulsatile lavage with suction   Wound Therapy - Frequency --  3x/week for 2 weeks then reduce to 2x/week for 6 weeks   Wound Therapy - Current Recommendations PT   Wound Plan F/U with ER.  continue selective debridement and PLS with appropraite dressings.     Dressing  silverhydrofiber, 2x2, kling and netting.                    PT Short Term Goals - 04/03/17 1449  PT SHORT TERM GOAL #1   Title Pt wound on his right foot to be 80% granulated to decrease risk of infection   Time 3   Period Weeks   Status New   Target Date 04/24/17     PT SHORT TERM GOAL #2   Title Pt wound depth on his right foot  to be no greater than one centimeter to show signs of healing    Time 3   Period Weeks     PT SHORT TERM GOAL #3   Title Pt undermining in his Rt foot wound  to be no greater than on centimeter to show signs of healing.l    Time 3   Period Weeks   Status New           PT Long Term Goals - 04/03/17 1452      PT LONG TERM GOAL #1   Title PT right foot wound to be 100% granulated to decrease risk of infection.    Time 6   Period Weeks   Status New   Target Date 05/15/17     PT LONG TERM GOAL #2   Title Pt wound on his Right foot depth to be no greater than .2 cm to decrease risk of infection.    Time 6   Period Weeks   Status New   Target Date 05/15/17     PT LONG TERM GOAL #3   Title Pt wound to no longer be undermining.    Time 8   Period Weeks   Target Date 05/29/17     PT LONG TERM GOAL #4   Title PT wound size to be no greater than .3x .5 and have no depth to allow pt to care for wound at home.    Time 8    Period Weeks   Status New             Patient will benefit from skilled therapeutic intervention in order to improve the following deficits and impairments:     Visit Diagnosis: Difficulty in walking, not elsewhere classified  Localized edema  Pain in right foot  Diabetic ulcer of right foot associated with diabetes mellitus due to underlying condition, with necrosis of muscle, unspecified part of foot [facility])     Problem List Patient Active Problem List   Diagnosis Date Noted  . Osteomyelitis due to type 2 diabetes mellitus ([place])   . Diabetic foot infection ([place]) 02/07/2017  . Type 2 diabetes mellitus with left diabetic foot infection ([place]) 02/07/2017  . Uncontrolled type 2 diabetes mellitus with hyperglycemia, with long-term current use of insulin ([place]) 02/07/2017  . Cellulitis of left foot 12/12/2016  . Nausea & vomiting 02/24/2016  . A-fib ([place]) 02/23/2016  . Hypothyroidism 02/23/2016  . CKD (chronic kidney disease), stage III 02/23/2016  . Anxiety 02/23/2016  . Chronic combined systolic (congestive) and diastolic (congestive) heart failure ([place]) 02/23/2016  . Dizziness 02/23/2016  . Open wound of right foot 02/23/2016  . Rectal bleeding 07/28/2015  . Chronic anticoagulation 07/28/2015  . PVD (peripheral vascular disease) ([place])   . S/P angioplasty 04/23/2015  . Peripheral arterial occlusive disease ([place]) 04/23/2015  . PAD (peripheral artery disease) ([place])   . Critical lower limb ischemia   . Toe ulcer, right ([place])   . Type II or unspecified type diabetes mellitus without mention of complication, uncontrolled 11/27/2013  . Acute respiratory failure with hypoxia ([place]) 11/26/2013  . CAP (community acquired pneumonia) 11/26/2013  . CHF exacerbation (  [place]) 11/25/2013  . Cardiomyopathy, ischemic 06/13/2013  . At risk for sudden cardiac death June 22, 2013  . S/P cardiac catheterization, Rt & Lt heart cath 06/05/2013 2013-06-22  . Acute on chronic combined systolic and  diastolic HF (heart failure), NYHA class 3 ([place]) 06/04/2013  . Atrial flutter with rapid ventricular response ([place]) 06/02/2013  . Acute renal failure: Cr up to 1.8 06/02/2013  . Hyperkalemia 06/02/2013    Class: Acute  . LBBB (left bundle branch block) 06/01/2013  . Hypotension- secondary to AF and Diltiazem Rx 06/01/2013  . Obesity (BMI 30-39.9) 06/01/2013  . Acute on chronic diastolic heart failure - due to Afib AVR 06/01/2013  . Atrial fibrillation with RVR- converted with Amiodarone 05/31/2013  . Stroke- Lt brain 2003 04/08/2013  . Hemiplegia affecting right dominant side ([place]) 04/08/2013  . Cataract 04/08/2013  . GERD (gastroesophageal reflux disease) 04/08/2013  . S/P CABG x 3 - 2010 04/08/2013  . S/P AVR-tissue, 2010 04/08/2013  . DM2 (diabetes mellitus, type 2) ([place]) 04/08/2013  . Dyslipidemia 04/08/2013  . SUBACROMIAL BURSITIS, LEFT 11/05/2009   Ihor Austin, LPTA; CBIS [phone]  Aldona Lento 04/05/2017, 2:19 PM  [place], [name], [address] Phone: [phone]   Fax:  [phone]  Name: MEKAI WILKINSON MRN: [id number] Date of Birth: [birth year]

## 2017-04-07 ENCOUNTER — Telehealth (HOSPITAL_COMMUNITY): Payer: Self-pay | Admitting: Physical Therapy

## 2017-04-07 ENCOUNTER — Ambulatory Visit (HOSPITAL_COMMUNITY): Payer: Medicare Other | Admitting: Physical Therapy

## 2017-04-07 DIAGNOSIS — R600 Localized edema: Secondary | ICD-10-CM

## 2017-04-07 DIAGNOSIS — R262 Difficulty in walking, not elsewhere classified: Secondary | ICD-10-CM

## 2017-04-07 DIAGNOSIS — M79671 Pain in right foot: Secondary | ICD-10-CM

## 2017-04-07 DIAGNOSIS — L97513 Non-pressure chronic ulcer of other part of right foot with necrosis of muscle: Secondary | ICD-10-CM

## 2017-04-07 DIAGNOSIS — E08621 Diabetes mellitus due to underlying condition with foot ulcer: Secondary | ICD-10-CM | POA: Diagnosis not present

## 2017-04-07 NOTE — Therapy (Signed)
[place], [name], [address] Phone: [phone]   Fax:  [phone]  Wound Care Therapy  Patient Details  Name: [name] MRN: [id number] Date of Birth: [birth year] Referring Provider: Nicholes Mango  Encounter Date: 04/07/2017      PT End of Session - 04/07/17 1122    Visit Number 3   Number of Visits 16   Date for PT Re-Evaluation 05/03/17   Authorization Type [place] - Visit Number 3   Authorization - Number of Visits 10   PT Start Time 9211   PT Stop Time 1105   PT Time Calculation (min) 35 min   Activity Tolerance Patient tolerated treatment well;No increased pain   Behavior During Therapy WFL for tasks assessed/performed      Past Medical History:  Diagnosis Date  . At risk for sudden cardiac death Jun 11, 2013  . Cataract   . Chronic anticoagulation 05/2013   started  . Chronic kidney disease    Patient reports he is seeing Kidney doctor in September  . Coronary artery disease   . Diabetes mellitus    type 2   . Dyslipidemia   . GERD (gastroesophageal reflux disease)   . History of valve replacement 2010   [facility] Ease bioprosthetic 23mm  . Hypertension   . PAF (paroxysmal atrial fibrillation) ([place]) 05/2013   converted with amiodarone to SR  . S/P CABG x 3 2010  . S/P cardiac catheterization, Rt & Lt heart cath 06/05/2013 2013/06/11  . Stroke [facility]) 2003   R-sided weakness & upper extremity swelling   . Thyroid disease     Past Surgical History:  Procedure Laterality Date  . AORTIC VALVE REPLACEMENT  01/26/2009   [facility] Ease bioprosthetic 22mm valve  . CARDIAC CATHETERIZATION  06/05/2013   native 3 vessel disease; patent LIMA to LAD, SVG to OM and SVG to PDA; Elevated right and left heart filling pressures, with predominantly pulmonary venous hypertension, normal PVR  . COLONOSCOPY  2007   Dr. Aviva Signs: internal hemorrhoids  . COLONOSCOPY  N/A 06/16/2016   Procedure: COLONOSCOPY;  Surgeon: Rogene Houston, MD;  [facility];  Service: Endoscopy;  Laterality: N/A;  1:45  . CORONARY ARTERY BYPASS GRAFT  01/26/2009   LIMA to LAD, SVG to circumflex, SVG to PDA  . GRAFT(S) ANGIOGRAM  06/05/2013   Procedure: GRAFT(S) Cyril Loosen;  Surgeon: Leonie Man, MD;  Location: [facility] CATH LAB;  Service: Cardiovascular;;  . IR ANGIOGRAM EXTREMITY LEFT  02/10/2017  . IR ANGIOGRAM EXTREMITY LEFT  02/27/2017  . IR FEM POP ART ATHERECT INC PTA MOD SED  02/10/2017  . IR GENERIC HISTORICAL  05/12/2016   IR RADIOLOGIST EVAL & MGMT 05/12/2016 Jacqulynn Cadet, MD [facility] INTERV RAD  . IR RADIOLOGIST EVAL & MGMT  01/04/2017  . IR US GUIDE VASC ACCESS LEFT  02/27/2017  . IR US GUIDE VASC ACCESS LEFT  02/27/2017  . IR US GUIDE VASC ACCESS RIGHT  02/10/2017  . LEFT AND RIGHT HEART CATHETERIZATION WITH CORONARY ANGIOGRAM N/A 06/05/2013   Procedure: LEFT AND RIGHT HEART CATHETERIZATION WITH CORONARY ANGIOGRAM;  Surgeon: Leonie Man, MD;  Location: [facility] CATH LAB;  Service: Cardiovascular;  Laterality: N/A;  . TRANSTHORACIC ECHOCARDIOGRAM  09/2011   grade 1 diastolic dysfunction; increasing valve gradient; calcified MV annulus   . TRANSTHORACIC ECHOCARDIOGRAM  06/03/2013   EF 25-30%, mod conc. hypertrophy, grade 3 diastolic dysfunction; LA mod dilated;  calcified MV annulus; transaortic gradients are normal for the bioprosthetic valve; inf vena cava dilated (elevated CVP) - LifeVest    There were no vitals filed for this visit.                  Wound Therapy - 04/07/17 1114    Subjective Pt states tha he ended up in the hospital.  He now has a wound on his right foot due to trying to take the weight off of his left foot.    Patient and Family Stated Goals wound to heal    Date of Onset 01/13/17   Prior Treatments self care    Pain Assessment No/denies pain   Pain Score --  states that the pain can go as high as an 8/10   Evaluation and  Treatment Procedures Explained to Patient/Family Yes   Evaluation and Treatment Procedures agreed to   Wound Properties Date First Assessed: 04/03/17 Time First Assessed: 0814 Wound Type: Non-pressure wound Location Orientation: Right Wound Description (Comments): lateral aspect of right metatarsal  Present on Admission: Yes   Dressing Type Gauze (Comment)   Dressing Changed Changed   Dressing Status None   Dressing Change Frequency Monday, Wednesday, Friday  for 2 weeks thane 2x a week for 6 weeks.    Site / Wound Assessment Black;Yellow   % Wound base Red or Granulating 0%   % Wound base Yellow/Fibrinous Exudate 85%   % Wound base Black/Eschar 15%   Peri-wound Assessment Intact;Erythema (blanchable)  into little toe    Wound Length (cm) 3.7 cm   Wound Width (cm) 4.2 cm   Drainage Amount Minimal  once therapist removed the black eschar.   Drainage Description Sanguineous;No odor   Treatment Cleansed;Debridement (Selective);Hydrotherapy (Pulse lavage)   Wound Properties dusky   Pulsed lavage therapy - wound location entire wound    Pulsed Lavage with Suction (psi) 8 psi   Pulsed Lavage with Suction - Normal Saline Used 1000 mL   Pulsed Lavage Tip Tip with splash shield   Selective Debridement - Location 5th MTP   Selective Debridement - Tools Used Forceps;Scissors   Selective Debridement - Tissue Removed slough and eschar    Wound Therapy - Clinical Statement Pt went to the ER and x-ray was taken which showed no bone involvement.  Pt is ambulating with steadier gait since his pain has decreased.    Wound Therapy - Functional Problem List difficulty walking    Factors Delaying/Impairing Wound Healing Altered sensation;Diabetes Mellitus;Infection - systemic/local;Immobility;Multiple medical problems;Polypharmacy;Vascular compromise   Hydrotherapy Plan Debridement;Dressing change;Patient/family education;Pulsatile lavage with suction   Wound Therapy - Frequency --  3x/week for 2 weeks  then reduce to 2x/week for 6 weeks   Wound Therapy - Current Recommendations PT   Wound Plan See pt 3x a week for the first 2 weeks then decrease to 2 x a week for 6 weeks.    Dressing  changed to hydrogel impregnated 2x2 f/b dry 2x2, kling and netting                    PT Short Term Goals - 04/07/17 1123      PT SHORT TERM GOAL #1   Title Pt wound on his right foot to be 80% granulated to decrease risk of infection   Time 3   Period Weeks   Status On-going     PT SHORT TERM GOAL #2   Title Pt wound depth on his right foot  to  be no greater than one centimeter to show signs of healing    Time 3   Period Weeks   Status On-going     PT SHORT TERM GOAL #3   Title Pt undermining in his Rt foot wound  to be no greater than on centimeter to show signs of healing.l    Time 3   Period Weeks   Status On-going           PT Long Term Goals - 04/07/17 1123      PT LONG TERM GOAL #1   Title PT right foot wound to be 100% granulated to decrease risk of infection.    Time 6   Period Weeks   Status On-going     PT LONG TERM GOAL #2   Title Pt wound on his Right foot depth to be no greater than .2 cm to decrease risk of infection.    Time 6   Period Weeks   Status On-going     PT LONG TERM GOAL #3   Title Pt wound to no longer be undermining.    Time 8   Period Weeks   Status On-going     PT LONG TERM GOAL #4   Title PT wound size to be no greater than .3x .5 and have no depth to allow pt to care for wound at home.    Time 8   Period Weeks   Status On-going               Plan - 04/07/17 1123    Clinical Impression Statement see above   Rehab Potential Fair   PT Frequency 3x / week  3 x a weeek for 2 weeks followed by 2x a week for 6 weeks    PT Duration 8 weeks   PT Treatment/Interventions ADLs/Self Care Home Management;Other (comment)  debridement and dressing change.    PT Next Visit Plan Continue with pulse lavage, debridement and dressing  change.   Consulted and Agree with Plan of Care Patient      Patient will benefit from skilled therapeutic intervention in order to improve the following deficits and impairments:  Abnormal gait, Decreased activity tolerance, Decreased balance, Other (comment), Pain, Difficulty walking (nonhealing wound.)  Visit Diagnosis: Difficulty in walking, not elsewhere classified  Localized edema  Pain in right foot  Diabetic ulcer of right foot associated with diabetes mellitus due to underlying condition, with necrosis of muscle, unspecified part of foot [facility])     Problem List Patient Active Problem List   Diagnosis Date Noted  . Osteomyelitis due to type 2 diabetes mellitus ([place])   . Diabetic foot infection ([place]) 02/07/2017  . Type 2 diabetes mellitus with left diabetic foot infection ([place]) 02/07/2017  . Uncontrolled type 2 diabetes mellitus with hyperglycemia, with long-term current use of insulin ([place]) 02/07/2017  . Cellulitis of left foot 12/12/2016  . Nausea & vomiting 02/24/2016  . A-fib ([place]) 02/23/2016  . Hypothyroidism 02/23/2016  . CKD (chronic kidney disease), stage III 02/23/2016  . Anxiety 02/23/2016  . Chronic combined systolic (congestive) and diastolic (congestive) heart failure ([place]) 02/23/2016  . Dizziness 02/23/2016  . Open wound of right foot 02/23/2016  . Rectal bleeding 07/28/2015  . Chronic anticoagulation 07/28/2015  . PVD (peripheral vascular disease) ([place])   . S/P angioplasty 04/23/2015  . Peripheral arterial occlusive disease ([place]) 04/23/2015  . PAD (peripheral artery disease) ([place])   . Critical lower limb ischemia   . Toe ulcer, right (  [place])   . Type II or unspecified type diabetes mellitus without mention of complication, uncontrolled 11/27/2013  . Acute respiratory failure with hypoxia ([place]) 11/26/2013  . CAP (community acquired pneumonia) 11/26/2013  . CHF exacerbation ([place]) 11/25/2013  . Cardiomyopathy, ischemic 06/13/2013  . At risk for sudden  cardiac death 24-Jun-2013  . S/P cardiac catheterization, Rt & Lt heart cath 06/05/2013 24-Jun-2013  . Acute on chronic combined systolic and diastolic HF (heart failure), NYHA class 3 ([place]) 06/04/2013  . Atrial flutter with rapid ventricular response ([place]) 06/02/2013  . Acute renal failure: Cr up to 1.8 06/02/2013  . Hyperkalemia 06/02/2013    Class: Acute  . LBBB (left bundle branch block) 06/01/2013  . Hypotension- secondary to AF and Diltiazem Rx 06/01/2013  . Obesity (BMI 30-39.9) 06/01/2013  . Acute on chronic diastolic heart failure - due to Afib AVR 06/01/2013  . Atrial fibrillation with RVR- converted with Amiodarone 05/31/2013  . Stroke- Lt brain 2003 04/08/2013  . Hemiplegia affecting right dominant side ([place]) 04/08/2013  . Cataract 04/08/2013  . GERD (gastroesophageal reflux disease) 04/08/2013  . S/P CABG x 3 - 2010 04/08/2013  . S/P AVR-tissue, 2010 04/08/2013  . DM2 (diabetes mellitus, type 2) ([place]) 04/08/2013  . Dyslipidemia 04/08/2013  . SUBACROMIAL BURSITIS, LEFT 11/05/2009    Rayetta Humphrey, PT CLT [phone] 04/07/2017, 11:24 AM  [place], [name], [address] Phone: [phone]   Fax:  [phone]  Name: [name] MRN: [id number] Date of Birth: 13-Dec-1945

## 2017-04-07 NOTE — Telephone Encounter (Signed)
Pt states he thought his apptment was at 9:00am. He knows he has arrived early. There are no notable changes on his schedule as far as change. NF 04/07/17

## 2017-04-10 ENCOUNTER — Ambulatory Visit (HOSPITAL_COMMUNITY): Payer: Medicare Other | Admitting: Physical Therapy

## 2017-04-10 ENCOUNTER — Ambulatory Visit: Payer: Medicare Other | Admitting: Infectious Diseases

## 2017-04-10 DIAGNOSIS — M79671 Pain in right foot: Secondary | ICD-10-CM | POA: Diagnosis not present

## 2017-04-10 DIAGNOSIS — E08621 Diabetes mellitus due to underlying condition with foot ulcer: Secondary | ICD-10-CM | POA: Diagnosis not present

## 2017-04-10 DIAGNOSIS — R262 Difficulty in walking, not elsewhere classified: Secondary | ICD-10-CM | POA: Diagnosis not present

## 2017-04-10 DIAGNOSIS — R600 Localized edema: Secondary | ICD-10-CM

## 2017-04-10 DIAGNOSIS — L97513 Non-pressure chronic ulcer of other part of right foot with necrosis of muscle: Secondary | ICD-10-CM

## 2017-04-10 NOTE — Therapy (Signed)
[place], [name], [address] Phone: [phone]   Fax:  [phone]  Wound Care Therapy  Patient Details  Name: [name] MRN: [id number] Date of Birth: Oct 05, 1945 Referring Provider: Nicholes Mango  Encounter Date: 04/10/2017      PT End of Session - 04/10/17 0955    Visit Number 4   Number of Visits 16   Date for PT Re-Evaluation 05/03/17   Authorization Type [place] - Visit Number 4   Authorization - Number of Visits 10   PT Start Time 0905   PT Stop Time 0945   PT Time Calculation (min) 40 min   Activity Tolerance Patient tolerated treatment well;No increased pain   Behavior During Therapy WFL for tasks assessed/performed      Past Medical History:  Diagnosis Date  . At risk for sudden cardiac death 07/07/13  . Cataract   . Chronic anticoagulation 05/2013   started  . Chronic kidney disease    Patient reports he is seeing Kidney doctor in September  . Coronary artery disease   . Diabetes mellitus    type 2   . Dyslipidemia   . GERD (gastroesophageal reflux disease)   . History of valve replacement 2010   [facility] Ease bioprosthetic 57mm  . Hypertension   . PAF (paroxysmal atrial fibrillation) ([place]) 05/2013   converted with amiodarone to SR  . S/P CABG x 3 2010  . S/P cardiac catheterization, Rt & Lt heart cath 06/05/2013 07/07/2013  . Stroke [facility]) 2003   R-sided weakness & upper extremity swelling   . Thyroid disease     Past Surgical History:  Procedure Laterality Date  . AORTIC VALVE REPLACEMENT  01/26/2009   [facility] Ease bioprosthetic 34mm valve  . CARDIAC CATHETERIZATION  06/05/2013   native 3 vessel disease; patent LIMA to LAD, SVG to OM and SVG to PDA; Elevated right and left heart filling pressures, with predominantly pulmonary venous hypertension, normal PVR  . COLONOSCOPY  2007   Dr. Aviva Signs: internal hemorrhoids  . COLONOSCOPY  N/A 06/16/2016   Procedure: COLONOSCOPY;  Surgeon: Rogene Houston, MD;  [facility];  Service: Endoscopy;  Laterality: N/A;  1:45  . CORONARY ARTERY BYPASS GRAFT  01/26/2009   LIMA to LAD, SVG to circumflex, SVG to PDA  . GRAFT(S) ANGIOGRAM  06/05/2013   Procedure: GRAFT(S) Cyril Loosen;  Surgeon: Leonie Man, MD;  Location: [facility] CATH LAB;  Service: Cardiovascular;;  . IR ANGIOGRAM EXTREMITY LEFT  02/10/2017  . IR ANGIOGRAM EXTREMITY LEFT  02/27/2017  . IR FEM POP ART ATHERECT INC PTA MOD SED  02/10/2017  . IR GENERIC HISTORICAL  05/12/2016   IR RADIOLOGIST EVAL & MGMT 05/12/2016 Jacqulynn Cadet, MD [facility] INTERV RAD  . IR RADIOLOGIST EVAL & MGMT  01/04/2017  . IR US GUIDE VASC ACCESS LEFT  02/27/2017  . IR US GUIDE VASC ACCESS LEFT  02/27/2017  . IR US GUIDE VASC ACCESS RIGHT  02/10/2017  . LEFT AND RIGHT HEART CATHETERIZATION WITH CORONARY ANGIOGRAM N/A 06/05/2013   Procedure: LEFT AND RIGHT HEART CATHETERIZATION WITH CORONARY ANGIOGRAM;  Surgeon: Leonie Man, MD;  Location: [facility] CATH LAB;  Service: Cardiovascular;  Laterality: N/A;  . TRANSTHORACIC ECHOCARDIOGRAM  09/2011   grade 1 diastolic dysfunction; increasing valve gradient; calcified MV annulus   . TRANSTHORACIC ECHOCARDIOGRAM  06/03/2013   EF 25-30%, mod conc. hypertrophy, grade 3 diastolic dysfunction; LA mod dilated;  calcified MV annulus; transaortic gradients are normal for the bioprosthetic valve; inf vena cava dilated (elevated CVP) - LifeVest    There were no vitals filed for this visit.                  Wound Therapy - 04/10/17 0951    Subjective Pt states he is still taking his oral antibiotics.  STates his foot hurts, especially at night when he's trying to sleep.    Patient and Family Stated Goals wound to heal    Date of Onset 01/13/17   Prior Treatments self care    Pain Assessment 0-10   Pain Score 7    Pain Type Acute pain   Pain Location Foot   Pain Orientation Right;Lateral   Pain  Descriptors / Indicators Discomfort;Burning   Pain Onset Awakened from sleep   Patients Stated Pain Goal 0   Pain Intervention(s) Medication (See eMAR)   Multiple Pain Sites No   Evaluation and Treatment Procedures Explained to Patient/Family Yes   Evaluation and Treatment Procedures agreed to   Wound Properties Date First Assessed: 04/03/17 Time First Assessed: 0814 Wound Type: Non-pressure wound Location Orientation: Right Wound Description (Comments): lateral aspect of right metatarsal  Present on Admission: Yes   Dressing Type Gauze (Comment)   Dressing Changed Changed   Dressing Status None   Dressing Change Frequency Monday, Wednesday, Friday  for 2 weeks thane 2x a week for 6 weeks.    Site / Wound Assessment Black;Yellow   % Wound base Red or Granulating 5%   % Wound base Yellow/Fibrinous Exudate 85%   % Wound base Black/Eschar 10%   Peri-wound Assessment Intact;Erythema (blanchable)  into little toe    Drainage Amount Minimal  once therapist removed the black eschar.   Drainage Description Sanguineous;No odor   Treatment Hydrotherapy (Pulse lavage);Debridement (Selective)   Pulsed lavage therapy - wound location entire wound    Pulsed Lavage with Suction (psi) 8 psi   Pulsed Lavage with Suction - Normal Saline Used 1000 mL   Pulsed Lavage Tip Tip with splash shield   Selective Debridement - Location Rt lateral foot, 5th MTP   Selective Debridement - Tools Used Forceps;Scissors   Selective Debridement - Tissue Removed slough and eschar    Wound Therapy - Clinical Statement Wound wtihout odor and overall improved with less black eschar following debridment.  Most sensitivity on plantar border of wound and tender to touch.  Able to debride more superior border and remove eschar.  Continued with hydrogel dressing.   Pt also c/o possible wound on Lt foot.  Removed sock and inspected foot well with no signs of wound present.    Wound Therapy - Functional Problem List difficulty  walking    Factors Delaying/Impairing Wound Healing Altered sensation;Diabetes Mellitus;Infection - systemic/local;Immobility;Multiple medical problems;Polypharmacy;Vascular compromise   Hydrotherapy Plan Debridement;Dressing change;Patient/family education;Pulsatile lavage with suction   Wound Therapy - Frequency --  3x/week for 2 weeks then reduce to 2x/week for 6 weeks   Wound Therapy - Current Recommendations PT   Wound Plan See pt 3x a week for the first 2 weeks then decrease to 2 x a week for 6 weeks.    Dressing  hydrogel impregnated 2x2 f/b dry 2x2, medipore and netting                    PT Short Term Goals - 04/07/17 1123      PT SHORT TERM GOAL #1   Title Pt  wound on his right foot to be 80% granulated to decrease risk of infection   Time 3   Period Weeks   Status On-going     PT SHORT TERM GOAL #2   Title Pt wound depth on his right foot  to be no greater than one centimeter to show signs of healing    Time 3   Period Weeks   Status On-going     PT SHORT TERM GOAL #3   Title Pt undermining in his Rt foot wound  to be no greater than on centimeter to show signs of healing.l    Time 3   Period Weeks   Status On-going           PT Long Term Goals - 04/07/17 1123      PT LONG TERM GOAL #1   Title PT right foot wound to be 100% granulated to decrease risk of infection.    Time 6   Period Weeks   Status On-going     PT LONG TERM GOAL #2   Title Pt wound on his Right foot depth to be no greater than .2 cm to decrease risk of infection.    Time 6   Period Weeks   Status On-going     PT LONG TERM GOAL #3   Title Pt wound to no longer be undermining.    Time 8   Period Weeks   Status On-going     PT LONG TERM GOAL #4   Title PT wound size to be no greater than .3x .5 and have no depth to allow pt to care for wound at home.    Time 8   Period Weeks   Status On-going             Patient will benefit from skilled therapeutic  intervention in order to improve the following deficits and impairments:     Visit Diagnosis: Difficulty in walking, not elsewhere classified  Localized edema  Pain in right foot  Diabetic ulcer of right foot associated with diabetes mellitus due to underlying condition, with necrosis of muscle, unspecified part of foot [facility])     Problem List Patient Active Problem List   Diagnosis Date Noted  . Osteomyelitis due to type 2 diabetes mellitus ([place])   . Diabetic foot infection ([place]) 02/07/2017  . Type 2 diabetes mellitus with left diabetic foot infection ([place]) 02/07/2017  . Uncontrolled type 2 diabetes mellitus with hyperglycemia, with long-term current use of insulin ([place]) 02/07/2017  . Cellulitis of left foot 12/12/2016  . Nausea & vomiting 02/24/2016  . A-fib ([place]) 02/23/2016  . Hypothyroidism 02/23/2016  . CKD (chronic kidney disease), stage III 02/23/2016  . Anxiety 02/23/2016  . Chronic combined systolic (congestive) and diastolic (congestive) heart failure ([place]) 02/23/2016  . Dizziness 02/23/2016  . Open wound of right foot 02/23/2016  . Rectal bleeding 07/28/2015  . Chronic anticoagulation 07/28/2015  . PVD (peripheral vascular disease) ([place])   . S/P angioplasty 04/23/2015  . Peripheral arterial occlusive disease ([place]) 04/23/2015  . PAD (peripheral artery disease) ([place])   . Critical lower limb ischemia   . Toe ulcer, right ([place])   . Type II or unspecified type diabetes mellitus without mention of complication, uncontrolled 11/27/2013  . Acute respiratory failure with hypoxia ([place]) 11/26/2013  . CAP (community acquired pneumonia) 11/26/2013  . CHF exacerbation ([place]) 11/25/2013  . Cardiomyopathy, ischemic 06/13/2013  . At risk for sudden cardiac death 29-Jun-2013  . S/P cardiac catheterization,  Rt & Lt heart cath 06/05/2013 06/07/2013  . Acute on chronic combined systolic and diastolic HF (heart failure), NYHA class 3 ([place]) 06/04/2013  . Atrial flutter with rapid  ventricular response ([place]) 06/02/2013  . Acute renal failure: Cr up to 1.8 06/02/2013  . Hyperkalemia 06/02/2013    Class: Acute  . LBBB (left bundle branch block) 06/01/2013  . Hypotension- secondary to AF and Diltiazem Rx 06/01/2013  . Obesity (BMI 30-39.9) 06/01/2013  . Acute on chronic diastolic heart failure - due to Afib AVR 06/01/2013  . Atrial fibrillation with RVR- converted with Amiodarone 05/31/2013  . Stroke- Lt brain 2003 04/08/2013  . Hemiplegia affecting right dominant side ([place]) 04/08/2013  . Cataract 04/08/2013  . GERD (gastroesophageal reflux disease) 04/08/2013  . S/P CABG x 3 - 2010 04/08/2013  . S/P AVR-tissue, 2010 04/08/2013  . DM2 (diabetes mellitus, type 2) ([place]) 04/08/2013  . Dyslipidemia 04/08/2013  . SUBACROMIAL BURSITIS, LEFT 11/05/2009   Teena Irani, PTA/CLT [phone]   Teena Irani 04/10/2017, 9:56 AM  [place], [name], [address] Phone: [phone]   Fax:  [phone]  Name: STEPEHN ECKARD MRN: [id number] Date of Birth: Jul 17, 1946

## 2017-04-12 ENCOUNTER — Ambulatory Visit
Admission: RE | Admit: 2017-04-12 | Discharge: 2017-04-12 | Disposition: A | Discharge status: Home / Self Care | Payer: Medicare Other | Source: Ambulatory Visit | Attending: Interventional Radiology | Admitting: Interventional Radiology

## 2017-04-12 ENCOUNTER — Ambulatory Visit (HOSPITAL_COMMUNITY): Payer: Medicare Other | Admitting: Physical Therapy

## 2017-04-12 DIAGNOSIS — Z9889 Other specified postprocedural states: Secondary | ICD-10-CM | POA: Diagnosis not present

## 2017-04-12 DIAGNOSIS — R600 Localized edema: Secondary | ICD-10-CM

## 2017-04-12 DIAGNOSIS — R262 Difficulty in walking, not elsewhere classified: Secondary | ICD-10-CM

## 2017-04-12 DIAGNOSIS — E08621 Diabetes mellitus due to underlying condition with foot ulcer: Secondary | ICD-10-CM | POA: Diagnosis not present

## 2017-04-12 DIAGNOSIS — I739 Peripheral vascular disease, unspecified: Secondary | ICD-10-CM

## 2017-04-12 DIAGNOSIS — L97513 Non-pressure chronic ulcer of other part of right foot with necrosis of muscle: Secondary | ICD-10-CM | POA: Diagnosis not present

## 2017-04-12 DIAGNOSIS — R9439 Abnormal result of other cardiovascular function study: Secondary | ICD-10-CM | POA: Diagnosis not present

## 2017-04-12 DIAGNOSIS — M79671 Pain in right foot: Secondary | ICD-10-CM

## 2017-04-12 DIAGNOSIS — L03116 Cellulitis of left lower limb: Secondary | ICD-10-CM

## 2017-04-12 HISTORY — PX: IR RADIOLOGIST EVAL & MGMT: IMG5224

## 2017-04-12 NOTE — Therapy (Signed)
[place], [name], [address] Phone: [phone]   Fax:  [phone]  Wound Care Therapy  Patient Details  Name: [name] MRN: [id number] Date of Birth: 13-Feb-1946 Referring Provider: Nicholes Mango  Encounter Date: 04/12/2017      PT End of Session - 04/12/17 0903    Visit Number 5   Number of Visits 16   Date for PT Re-Evaluation 05/03/17   Authorization Type [place] - Visit Number 5   Authorization - Number of Visits 10   PT Start Time 0820   PT Stop Time 0850   PT Time Calculation (min) 30 min   Activity Tolerance Patient tolerated treatment well;No increased pain   Behavior During Therapy WFL for tasks assessed/performed      Past Medical History:  Diagnosis Date  . At risk for sudden cardiac death 21-Jun-2013  . Cataract   . Chronic anticoagulation 05/2013   started  . Chronic kidney disease    Patient reports he is seeing Kidney doctor in September  . Coronary artery disease   . Diabetes mellitus    type 2   . Dyslipidemia   . GERD (gastroesophageal reflux disease)   . History of valve replacement 2010   [facility] Ease bioprosthetic 59mm  . Hypertension   . PAF (paroxysmal atrial fibrillation) ([place]) 05/2013   converted with amiodarone to SR  . S/P CABG x 3 2010  . S/P cardiac catheterization, Rt & Lt heart cath 06/05/2013 Jun 21, 2013  . Stroke [facility]) 2003   R-sided weakness & upper extremity swelling   . Thyroid disease     Past Surgical History:  Procedure Laterality Date  . AORTIC VALVE REPLACEMENT  01/26/2009   [facility] Ease bioprosthetic 58mm valve  . CARDIAC CATHETERIZATION  06/05/2013   native 3 vessel disease; patent LIMA to LAD, SVG to OM and SVG to PDA; Elevated right and left heart filling pressures, with predominantly pulmonary venous hypertension, normal PVR  . COLONOSCOPY  2007   Dr. Aviva Signs: internal hemorrhoids  . COLONOSCOPY  N/A 06/16/2016   Procedure: COLONOSCOPY;  Surgeon: Rogene Houston, MD;  [facility];  Service: Endoscopy;  Laterality: N/A;  1:45  . CORONARY ARTERY BYPASS GRAFT  01/26/2009   LIMA to LAD, SVG to circumflex, SVG to PDA  . GRAFT(S) ANGIOGRAM  06/05/2013   Procedure: GRAFT(S) Cyril Loosen;  Surgeon: Leonie Man, MD;  Location: [facility] CATH LAB;  Service: Cardiovascular;;  . IR ANGIOGRAM EXTREMITY LEFT  02/10/2017  . IR ANGIOGRAM EXTREMITY LEFT  02/27/2017  . IR FEM POP ART ATHERECT INC PTA MOD SED  02/10/2017  . IR GENERIC HISTORICAL  05/12/2016   IR RADIOLOGIST EVAL & MGMT 05/12/2016 Jacqulynn Cadet, MD [facility] INTERV RAD  . IR RADIOLOGIST EVAL & MGMT  01/04/2017  . IR US GUIDE VASC ACCESS LEFT  02/27/2017  . IR US GUIDE VASC ACCESS LEFT  02/27/2017  . IR US GUIDE VASC ACCESS RIGHT  02/10/2017  . LEFT AND RIGHT HEART CATHETERIZATION WITH CORONARY ANGIOGRAM N/A 06/05/2013   Procedure: LEFT AND RIGHT HEART CATHETERIZATION WITH CORONARY ANGIOGRAM;  Surgeon: Leonie Man, MD;  Location: [facility] CATH LAB;  Service: Cardiovascular;  Laterality: N/A;  . TRANSTHORACIC ECHOCARDIOGRAM  09/2011   grade 1 diastolic dysfunction; increasing valve gradient; calcified MV annulus   . TRANSTHORACIC ECHOCARDIOGRAM  06/03/2013   EF 25-30%, mod conc. hypertrophy, grade 3 diastolic dysfunction; LA mod dilated;  calcified MV annulus; transaortic gradients are normal for the bioprosthetic valve; inf vena cava dilated (elevated CVP) - LifeVest    There were no vitals filed for this visit.       Subjective Assessment - 04/12/17 0900    Subjective pt states his foot hurts most when he lays down at night.    Currently in Pain? Yes   Pain Score 7    Pain Location Foot   Pain Orientation Right;Left   Pain Descriptors / Indicators Burning;Throbbing   Pain Type Acute pain                   Wound Therapy - 04/12/17 0901    Subjective Pt states he is still taking his oral antibiotics.  STates his foot  hurts, especially at night when he's trying to sleep.    Patient and Family Stated Goals wound to heal    Date of Onset 01/13/17   Prior Treatments self care    Pain Score 7    Pain Type Acute pain   Pain Location Foot   Pain Orientation Right   Pain Descriptors / Indicators Burning;Throbbing   Pain Onset Awakened from sleep   Evaluation and Treatment Procedures Explained to Patient/Family Yes   Evaluation and Treatment Procedures agreed to   Wound Properties Date First Assessed: 04/03/17 Time First Assessed: 0814 Wound Type: Non-pressure wound Location Orientation: Right Wound Description (Comments): lateral aspect of right metatarsal  Present on Admission: Yes   Dressing Type Gauze (Comment)   Dressing Changed Changed   Dressing Status None   Dressing Change Frequency Monday, Wednesday, Friday  for 2 weeks thane 2x a week for 6 weeks.    Site / Wound Assessment Black;Yellow   % Wound base Red or Granulating 5%   % Wound base Yellow/Fibrinous Exudate 90%   % Wound base Black/Eschar 5%   Peri-wound Assessment Intact;Erythema (blanchable)  into little toe    Drainage Amount Minimal  once therapist removed the black eschar.   Drainage Description Sanguineous;No odor   Treatment Hydrotherapy (Pulse lavage);Debridement (Selective)   Pulsed lavage therapy - wound location entire wound    Pulsed Lavage with Suction (psi) 8 psi   Pulsed Lavage with Suction - Normal Saline Used 1000 mL   Pulsed Lavage Tip Tip with splash shield   Selective Debridement - Location Rt lateral foot, 5th MTP   Selective Debridement - Tools Used Forceps;Scissors   Selective Debridement - Tissue Removed slough and eschar    Wound Therapy - Clinical Statement continued improvement with no more black eschar following debridment.  Most sensitivity remains on plantar border of wound and tender to touch.  Able to debride remaining edges to promote approximation.  Continued with hydrogel dressing.      Wound Therapy -  Functional Problem List difficulty walking    Factors Delaying/Impairing Wound Healing Altered sensation;Diabetes Mellitus;Infection - systemic/local;Immobility;Multiple medical problems;Polypharmacy;Vascular compromise   Hydrotherapy Plan Debridement;Dressing change;Patient/family education;Pulsatile lavage with suction   Wound Therapy - Frequency --  3x/week for 2 weeks then reduce to 2x/week for 6 weeks   Wound Therapy - Current Recommendations PT   Wound Plan See pt 3x a week for the first 2 weeks then decrease to 2 x a week for 6 weeks.    Dressing  hydrogel impregnated 2x2 f/b dry 2x2, medipore and netting                    PT Short Term Goals - 04/07/17  [place] #1   Title Pt wound on his right foot to be 80% granulated to decrease risk of infection   Time 3   Period Weeks   Status On-going     PT SHORT TERM GOAL #2   Title Pt wound depth on his right foot  to be no greater than one centimeter to show signs of healing    Time 3   Period Weeks   Status On-going     PT SHORT TERM GOAL #3   Title Pt undermining in his Rt foot wound  to be no greater than on centimeter to show signs of healing.l    Time 3   Period Weeks   Status On-going           PT Long Term Goals - 04/07/17 1123      PT LONG TERM GOAL #1   Title PT right foot wound to be 100% granulated to decrease risk of infection.    Time 6   Period Weeks   Status On-going     PT LONG TERM GOAL #2   Title Pt wound on his Right foot depth to be no greater than .2 cm to decrease risk of infection.    Time 6   Period Weeks   Status On-going     PT LONG TERM GOAL #3   Title Pt wound to no longer be undermining.    Time 8   Period Weeks   Status On-going     PT LONG TERM GOAL #4   Title PT wound size to be no greater than .3x .5 and have no depth to allow pt to care for wound at home.    Time 8   Period Weeks   Status On-going             Patient will benefit  from skilled therapeutic intervention in order to improve the following deficits and impairments:     Visit Diagnosis: Difficulty in walking, not elsewhere classified  Localized edema  Pain in right foot  Diabetic ulcer of right foot associated with diabetes mellitus due to underlying condition, with necrosis of muscle, unspecified part of foot [facility])     Problem List Patient Active Problem List   Diagnosis Date Noted  . Osteomyelitis due to type 2 diabetes mellitus ([place])   . Diabetic foot infection ([place]) 02/07/2017  . Type 2 diabetes mellitus with left diabetic foot infection ([place]) 02/07/2017  . Uncontrolled type 2 diabetes mellitus with hyperglycemia, with long-term current use of insulin ([place]) 02/07/2017  . Cellulitis of left foot 12/12/2016  . Nausea & vomiting 02/24/2016  . A-fib ([place]) 02/23/2016  . Hypothyroidism 02/23/2016  . CKD (chronic kidney disease), stage III 02/23/2016  . Anxiety 02/23/2016  . Chronic combined systolic (congestive) and diastolic (congestive) heart failure ([place]) 02/23/2016  . Dizziness 02/23/2016  . Open wound of right foot 02/23/2016  . Rectal bleeding 07/28/2015  . Chronic anticoagulation 07/28/2015  . PVD (peripheral vascular disease) ([place])   . S/P angioplasty 04/23/2015  . Peripheral arterial occlusive disease ([place]) 04/23/2015  . PAD (peripheral artery disease) ([place])   . Critical lower limb ischemia   . Toe ulcer, right ([place])   . Type II or unspecified type diabetes mellitus without mention of complication, uncontrolled 11/27/2013  . Acute respiratory failure with hypoxia ([place]) 11/26/2013  . CAP (community acquired pneumonia) 11/26/2013  . CHF exacerbation ([place]) 11/25/2013  . Cardiomyopathy, ischemic  06/13/2013  . At risk for sudden cardiac death 07-05-13  . S/P cardiac catheterization, Rt & Lt heart cath 06/05/2013 2013-07-05  . Acute on chronic combined systolic and diastolic HF (heart failure), NYHA class 3 ([name]) 06/04/2013  . Atrial  flutter with rapid ventricular response ([place]) 06/02/2013  . Acute renal failure: Cr up to 1.8 06/02/2013  . Hyperkalemia 06/02/2013    Class: Acute  . LBBB (left bundle branch block) 06/01/2013  . Hypotension- secondary to AF and Diltiazem Rx 06/01/2013  . Obesity (BMI 30-39.9) 06/01/2013  . Acute on chronic diastolic heart failure - due to Afib AVR 06/01/2013  . Atrial fibrillation with RVR- converted with Amiodarone 05/31/2013  . Stroke- Lt brain 2003 04/08/2013  . Hemiplegia affecting right dominant side ([place]) 04/08/2013  . Cataract 04/08/2013  . GERD (gastroesophageal reflux disease) 04/08/2013  . S/P CABG x 3 - 2010 04/08/2013  . S/P AVR-tissue, 2010 04/08/2013  . DM2 (diabetes mellitus, type 2) ([place]) 04/08/2013  . Dyslipidemia 04/08/2013  . SUBACROMIAL BURSITIS, LEFT 11/05/2009   Teena Irani, PTA/CLT [phone]  Teena Irani 04/12/2017, 9:04 AM  [place], [name], [address] Phone: [phone]   Fax:  [phone]  Name: [name] MRN: [id number] Date of Birth: [birth year]

## 2017-04-12 NOTE — Progress Notes (Signed)
Chief Complaint: Patient was seen in follow up today for left leg critical limb ischemia at the request of Meta  Referring Physician(s): Conn Trombetta  History of Present Illness: Delquan I Kuss is a 71 y.o. male with severe bilateral lower extremity peripheral arterial disease and bilateral lower extremity critical limb ischemia. His right lower extremity was revascularized in the past with successful healing of his right foot wound. He developed a left foot wound relatively recently and underwent revascularization on 02/10/2017. He presents today for follow-up evaluation.  He is doing well and without pain. He reports that his left foot is healing much better. He went to the wound care clinic earlier today and his foot is freshly bandaged. He denies current fever or chills. His only complaint is having some difficulty with balance. He continues to walk with a cane. He is very pleased that we were able to help defer amputation.  He is compliant with his Plavix and other medications.  Past Medical History:  Diagnosis Date  . At risk for sudden cardiac death Jun 24, 2013  . Cataract   . Chronic anticoagulation 05/2013   started  . Chronic kidney disease    Patient reports he is seeing Kidney doctor in September  . Coronary artery disease   . Diabetes mellitus    type 2   . Dyslipidemia   . GERD (gastroesophageal reflux disease)   . History of valve replacement 2010   [facility] Ease bioprosthetic 14mm  . Hypertension   . PAF (paroxysmal atrial fibrillation) ([place]) 05/2013   converted with amiodarone to SR  . S/P CABG x 3 2010  . S/P cardiac catheterization, Rt & Lt heart cath 06/05/2013 24-Jun-2013  . Stroke [facility]) 2003   R-sided weakness & upper extremity swelling   . Thyroid disease     Past Surgical History:  Procedure Laterality Date  . AORTIC VALVE REPLACEMENT  01/26/2009   [facility] Ease bioprosthetic 63mm valve  . CARDIAC CATHETERIZATION   06/05/2013   native 3 vessel disease; patent LIMA to LAD, SVG to OM and SVG to PDA; Elevated right and left heart filling pressures, with predominantly pulmonary venous hypertension, normal PVR  . COLONOSCOPY  2007   Dr. Aviva Signs: internal hemorrhoids  . COLONOSCOPY N/A 06/16/2016   Procedure: COLONOSCOPY;  Surgeon: Rogene Houston, MD;  Location: [facility] SUITE;  Service: Endoscopy;  Laterality: N/A;  1:45  . CORONARY ARTERY BYPASS GRAFT  01/26/2009   LIMA to LAD, SVG to circumflex, SVG to PDA  . GRAFT(S) ANGIOGRAM  06/05/2013   Procedure: GRAFT(S) Cyril Loosen;  Surgeon: Leonie Man, MD;  Location: [facility] CATH LAB;  Service: Cardiovascular;;  . IR ANGIOGRAM EXTREMITY LEFT  02/10/2017  . IR ANGIOGRAM EXTREMITY LEFT  02/27/2017  . IR FEM POP ART ATHERECT INC PTA MOD SED  02/10/2017  . IR GENERIC HISTORICAL  05/12/2016   IR RADIOLOGIST EVAL & MGMT 05/12/2016 Jacqulynn Cadet, MD [facility] INTERV RAD  . IR RADIOLOGIST EVAL & MGMT  01/04/2017  . IR US GUIDE VASC ACCESS LEFT  02/27/2017  . IR US GUIDE VASC ACCESS LEFT  02/27/2017  . IR US GUIDE VASC ACCESS RIGHT  02/10/2017  . LEFT AND RIGHT HEART CATHETERIZATION WITH CORONARY ANGIOGRAM N/A 06/05/2013   Procedure: LEFT AND RIGHT HEART CATHETERIZATION WITH CORONARY ANGIOGRAM;  Surgeon: Leonie Man, MD;  Location: [facility] CATH LAB;  Service: Cardiovascular;  Laterality: N/A;  . TRANSTHORACIC ECHOCARDIOGRAM  09/2011   grade 1 diastolic dysfunction; increasing valve  gradient; calcified MV annulus   . TRANSTHORACIC ECHOCARDIOGRAM  06/03/2013   EF 25-30%, mod conc. hypertrophy, grade 3 diastolic dysfunction; LA mod dilated; calcified MV annulus; transaortic gradients are normal for the bioprosthetic valve; inf vena cava dilated (elevated CVP) - LifeVest    Allergies: Patient has no known allergies.  Medications: Prior to Admission medications   Medication Sig Start Date End Date Taking? Authorizing Provider  acetaminophen (TYLENOL) 500 MG tablet Take 1,000 mg  by mouth 2 (two) times daily as needed for headache. For pain/headaches    [provider]  alprazolam Duanne Moron) 2 MG tablet Take 1 mg by mouth 3 (three) times daily.     [provider]  amiodarone (PACERONE) 200 MG tablet Take 1 tablet (200 mg total) by mouth daily. 10/14/16   Hilty, Nadean Corwin, MD  amoxicillin-clavulanate (AUGMENTIN) 500-125 MG tablet Take 1 tablet (500 mg total) by mouth 2 (two) times daily. 03/20/17   Michel Bickers, MD  apixaban (ELIQUIS) 5 MG TABS tablet Take 1 tablet (5 mg total) by mouth 2 (two) times daily. 10/14/16   Hilty, Nadean Corwin, MD  atorvastatin (LIPITOR) 20 MG tablet Take 1 tablet (20 mg total) by mouth daily at 6 PM. 06/18/16   Rehman, Mechele Dawley, MD  doxycycline (VIBRAMYCIN) 100 MG capsule Take 1 capsule (100 mg total) by mouth 2 (two) times daily. 04/05/17   Forde Dandy, MD  fenofibrate (TRICOR) 145 MG tablet Take 1 tablet (145 mg total) by mouth daily. 03/20/17   Hilty, Nadean Corwin, MD  ferrous sulfate 325 (65 FE) MG EC tablet Take 325 mg by mouth daily with breakfast.    [provider]  gabapentin (NEURONTIN) 300 MG capsule Take 300 mg by mouth 2 (two) times daily.     [provider]  insulin glargine (LANTUS) 100 UNIT/ML injection Inject 0.5 mLs (50 Units total) into the skin 2 (two) times daily. 75 units in the morning then 60 units at bedtime 02/14/17   Patrecia Pour, Christean Grief, MD  insulin lispro (HUMALOG) 100 UNIT/ML injection Inject 0.08 mLs (8 Units total) into the skin 3 (three) times daily with meals. 02/14/17 03/16/17  Patrecia Pour, Christean Grief, MD  INVANZ 1 g injection  03/18/17   [provider]  INVOKANA 100 MG TABS tablet Take 1 tablet by mouth daily. 12/09/16   [provider]  levothyroxine (SYNTHROID, LEVOTHROID) 25 MCG tablet Take 25 mcg by mouth daily before breakfast.    [provider]  meclizine (ANTIVERT) 25 MG tablet Take 25 mg by mouth 3 (three) times daily as needed for dizziness.    [provider]  metoprolol succinate (TOPROL-XL) 25 MG 24 hr tablet Take 1 tablet (25 mg total) by mouth daily. 10/14/16   Hilty, Nadean Corwin, MD  Omega-3 Fatty Acids (FISH OIL) 1200 MG CAPS Take 1 capsule by mouth daily.     [provider]  oxyCODONE-acetaminophen (PERCOCET/ROXICET) 5-325 MG tablet Take 1 tablet by mouth 2 (two) times daily.    [provider]  pantoprazole (PROTONIX) 40 MG tablet Take 1 tablet (40 mg total) by mouth 2 (two) times daily. 10/30/14   Hilty, Nadean Corwin, MD  silver sulfADIAZINE (SILVADENE) 1 % cream Apply 1 application topically daily.    [provider]  sodium chloride 0.9 % infusion  03/18/17   [provider]  torsemide (DEMADEX) 10 MG tablet Take 1 tablet (10 mg total) by mouth daily. 10/14/16   Hilty, Nadean Corwin, MD  Family History  Problem Relation Age of Onset  . Heart attack Mother   . Liver disease Brother   . Diabetes Sister        x4    Social History   Social History  . Marital status: Divorced    Spouse name: N/A  . Number of children: 4  . Years of education: N/A   Occupational History  . 4 Retired   Social History Main Topics  . Smoking status: Former Smoker    Types: Cigarettes    Quit date: 08/15/2001  . Smokeless tobacco: Former Systems developer    Types: Chew     Comment: Quit in 2003  . Alcohol use No  . Drug use: No  . Sexual activity: Not Currently   Other Topics Concern  . None   Social History Narrative  . None    Review of Systems: A 12 point ROS discussed and pertinent positives are indicated in the HPI above.  All other systems are negative.  Review of Systems  Vital Signs: BP (!) 151/74 (BP Location: Left Arm, Patient Position: Sitting, Cuff Size: Normal)   Pulse 69   Temp (!) 97.2 F (36.2 C)   Resp (!) 22   SpO2 99%   Physical Exam  Constitutional: He is oriented to person, place, and time. He appears well-developed and well-nourished. No distress.  HENT:  Head: Normocephalic and atraumatic.   Eyes: No scleral icterus.  Cardiovascular: Normal rate and regular rhythm.   Pulmonary/Chest: Effort normal.  Abdominal: Soft. There is no tenderness.  Neurological: He is alert and oriented to person, place, and time.  Psychiatric: He has a normal mood and affect. His behavior is normal.  Nursing note and vitals reviewed.   Imaging: Dg Foot Complete Right  Result Date: 04/05/2017 CLINICAL DATA:  Lateral foot ulcer. EXAM: RIGHT FOOT COMPLETE - 3+ VIEW COMPARISON:  None. FINDINGS: There is a soft tissue ulcer over the fifth metatarsal head. The underlying bone appears mottled on the AP view but this is considered projectional given the oblique view. No fracture deformity or joint erosion. Chronic appearing deformity of the great toe tuft with overlying soft tissue flattening. No acute fracture or dislocation. Small plantar heel spur. IMPRESSION: Lateral foot ulcer over the fifth metatarsal head. No definitive osteomyelitis. No opaque foreign body or tracking soft tissue gas. Electronically Signed   By: Monte Fantasia M.D.   On: 04/05/2017 15:59    Labs:  CBC:  Recent Labs  02/10/17 1111 02/11/17 0421 02/27/17 0629 04/05/17 1458  WBC 6.5 6.7 8.6 11.2*  HGB 12.6* 11.4* 12.2* 12.4*  HCT 39.8 36.1* 37.6* 38.8*  PLT 243 190 238 293    COAGS:  Recent Labs  02/10/17 0413 02/27/17 0629  INR 1.08 1.08  APTT  --  34    BMP:  Recent Labs  02/10/17 1111 02/11/17 0421 02/27/17 0629 04/05/17 1458  NA 137 135 139 136  K 4.7 4.4 4.2 4.1  CL 102 102 104 100*  CO2 26 26 27 26   GLUCOSE 198* 294* 150* 281*  BUN 17 17 17  21*  CALCIUM 9.6 8.7* 9.4 9.1  CREATININE 1.23 1.21 1.53* 1.63*  GFRNONAA 59* 60* 45* 42*  GFRAA >60 >60 52* 48*    LIVER FUNCTION TESTS:  Recent Labs  04/14/16 1059 12/12/16 1259  BILITOT 0.5 0.4  AST 41* 29  ALT 52* 29  ALKPHOS 39* 48  PROT 6.6 7.0  ALBUMIN 4.3 3.8    TUMOR MARKERS:  No results for input(s): AFPTM, CEA, CA199, CHROMGRNA in  the last 8760 hours.  Assessment and Plan:  Mr. Santelli is doing very well after his last intervention. His ankle brachial indices have normalized in the left lower extremity. I am very pleased by this outcome. Additionally, Mr. Dorantes tells me that his wound is healing much more quickly. He was at wound care earlier today and has his left foot newly bandage so I did not evaluate the wound myself.  He is walking with a cane. He has no questions or complaints at this time.  1.) Return clinic visit in 3 months with repeat noninvasive vascular evaluation of the bilateral lower extremities and a duplex arterial ultrasound of the left lower extremity.  2.) Continue excellent wound care   Electronically Signed: [place], [place] 04/12/2017, 5:12 PM   I spent a total of 15 Minutes in face to face in clinical consultation, greater than 50% of which was counseling/coordinating care for peripheral arterial disease.

## 2017-04-14 ENCOUNTER — Telehealth (HOSPITAL_COMMUNITY): Payer: Self-pay | Admitting: Internal Medicine

## 2017-04-14 ENCOUNTER — Ambulatory Visit (HOSPITAL_COMMUNITY): Payer: Medicare Other | Admitting: Physical Therapy

## 2017-04-14 ENCOUNTER — Other Ambulatory Visit: Payer: Self-pay | Admitting: *Deleted

## 2017-04-14 DIAGNOSIS — E08621 Diabetes mellitus due to underlying condition with foot ulcer: Secondary | ICD-10-CM

## 2017-04-14 DIAGNOSIS — R600 Localized edema: Secondary | ICD-10-CM

## 2017-04-14 DIAGNOSIS — M79671 Pain in right foot: Secondary | ICD-10-CM

## 2017-04-14 DIAGNOSIS — L97513 Non-pressure chronic ulcer of other part of right foot with necrosis of muscle: Secondary | ICD-10-CM | POA: Diagnosis not present

## 2017-04-14 DIAGNOSIS — R262 Difficulty in walking, not elsewhere classified: Secondary | ICD-10-CM
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
## 2017-04-14 NOTE — Therapy (Signed)
[facility], [name], [address] Phone: [phone]   Fax:  [phone]  Wound Care Therapy  Patient Details  Name: [name] MRN: [id number] Date of Birth: April 16, 1946 Referring Provider: Nicholes Mango  Encounter Date: 04/14/2017      PT End of Session - 04/14/17 1205    Visit Number 6   Number of Visits 16   Date for PT Re-Evaluation 05/03/17   Authorization Type [place] - Visit Number 6   Authorization - Number of Visits 10   PT Start Time 0815   PT Stop Time 0900   PT Time Calculation (min) 45 min   Activity Tolerance Patient tolerated treatment well;No increased pain   Behavior During Therapy WFL for tasks assessed/performed      Past Medical History:  Diagnosis Date  . At risk for sudden cardiac death 07/03/2013  . Cataract   . Chronic anticoagulation 05/2013   started  . Chronic kidney disease    Patient reports he is seeing Kidney doctor in September  . Coronary artery disease   . Diabetes mellitus    type 2   . Dyslipidemia   . GERD (gastroesophageal reflux disease)   . History of valve replacement 2010   [facility] Ease bioprosthetic 89mm  . Hypertension   . PAF (paroxysmal atrial fibrillation) ([place]) 05/2013   converted with amiodarone to SR  . S/P CABG x 3 2010  . S/P cardiac catheterization, Rt & Lt heart cath 06/05/2013 07/03/13  . Stroke [facility]) 2003   R-sided weakness & upper extremity swelling   . Thyroid disease     Past Surgical History:  Procedure Laterality Date  . AORTIC VALVE REPLACEMENT  01/26/2009   [facility] Ease bioprosthetic 35mm valve  . CARDIAC CATHETERIZATION  06/05/2013   native 3 vessel disease; patent LIMA to LAD, SVG to OM and SVG to PDA; Elevated right and left heart filling pressures, with predominantly pulmonary venous hypertension, normal PVR  . COLONOSCOPY  2007   Dr. Aviva Signs: internal hemorrhoids  . COLONOSCOPY  N/A 06/16/2016   Procedure: COLONOSCOPY;  Surgeon: Rogene Houston, MD;  [facility];  Service: Endoscopy;  Laterality: N/A;  1:45  . CORONARY ARTERY BYPASS GRAFT  01/26/2009   LIMA to LAD, SVG to circumflex, SVG to PDA  . GRAFT(S) ANGIOGRAM  06/05/2013   Procedure: GRAFT(S) Cyril Loosen;  Surgeon: Leonie Man, MD;  Location: [facility] CATH LAB;  Service: Cardiovascular;;  . IR ANGIOGRAM EXTREMITY LEFT  02/10/2017  . IR ANGIOGRAM EXTREMITY LEFT  02/27/2017  . IR FEM POP ART ATHERECT INC PTA MOD SED  02/10/2017  . IR GENERIC HISTORICAL  05/12/2016   IR RADIOLOGIST EVAL & MGMT 05/12/2016 Jacqulynn Cadet, MD [facility] INTERV RAD  . IR RADIOLOGIST EVAL & MGMT  01/04/2017  . IR US GUIDE VASC ACCESS LEFT  02/27/2017  . IR US GUIDE VASC ACCESS LEFT  02/27/2017  . IR US GUIDE VASC ACCESS RIGHT  02/10/2017  . LEFT AND RIGHT HEART CATHETERIZATION WITH CORONARY ANGIOGRAM N/A 06/05/2013   Procedure: LEFT AND RIGHT HEART CATHETERIZATION WITH CORONARY ANGIOGRAM;  Surgeon: Leonie Man, MD;  Location: [facility] C[facility] LAB;  Service: Cardiovascular;  Laterality: N/A;  . TRANSTHORACIC ECHOCARDIOGRAM  09/2011   grade 1 diastolic dysfunction; increasing valve gradient; calcified MV annulus   . TRANSTHORACIC ECHOCARDIOGRAM  06/03/2013   EF 25-30%, mod conc. hypertrophy, grade 3 diastolic dysfunction; LA mod dilated;  calcified MV annulus; transaortic gradients are normal for the bioprosthetic valve; inf vena cava dilated (elevated CVP) - LifeVest    There were no vitals filed for this visit.                  Wound Therapy - 04/14/17 1201    Subjective Pt states that he got a good report from the vascular MD    Patient and Family Stated Goals wound to heal    Date of Onset 01/13/17   Prior Treatments self care    Pain Assessment 0-10   Pain Score 5    Pain Location Foot   Pain Orientation Right   Pain Descriptors / Indicators Discomfort   Evaluation and Treatment Procedures Explained to Patient/Family  Yes   Evaluation and Treatment Procedures agreed to   Wound Properties Date First Assessed: 04/03/17 Time First Assessed: 0814 Wound Type: Non-pressure wound Location Orientation: Right Wound Description (Comments): lateral aspect of right metatarsal  Present on Admission: Yes   Dressing Type Gauze (Comment)   Dressing Changed Changed   Dressing Status None   Dressing Change Frequency Monday, Wednesday, Friday  for 2 weeks thane 2x a week for 6 weeks.    Site / Wound Assessment Yellow   % Wound base Red or Granulating 5%   % Wound base Yellow/Fibrinous Exudate 95%   Peri-wound Assessment Intact;Erythema (blanchable)  into little toe    Drainage Amount Minimal  once therapist removed the black eschar.   Drainage Description Sanguineous;No odor   Treatment Cleansed;Debridement (Selective);Hydrotherapy (Pulse lavage)   Pulsed lavage therapy - wound location entire wound    Pulsed Lavage with Suction (psi) 8 psi   Pulsed Lavage with Suction - Normal Saline Used 1000 mL   Pulsed Lavage Tip Tip with splash shield   Selective Debridement - Location Rt lateral foot, 5th MTP   Selective Debridement - Tools Used Forceps;Scissors   Selective Debridement - Tissue Removed slough and eschar    Wound Therapy - Clinical Statement PT little toe with increased redness.  Noted some maceration along wound periphery and between little toe and fourth toe.  When therapist investigated further a small wound between fourth and fith toe was found.  Small piece of silverhydrofiber placed in this area.  Changed dressing on lateral aspect of foot to silverhydrofiber as well due to the fact that now that the eschar is back there is more drainage.    Wound Therapy - Functional Problem List difficulty walking    Factors Delaying/Impairing Wound Healing Altered sensation;Diabetes Mellitus;Infection - systemic/local;Immobility;Multiple medical problems;Polypharmacy;Vascular compromise   Hydrotherapy Plan  Debridement;Dressing change;Patient/family education;Pulsatile lavage with suction   Wound Therapy - Frequency --  3x/week for 2 weeks then reduce to 2x/week for 6 weeks   Wound Therapy - Current Recommendations PT   Wound Plan See pt 3x a week for the first 2 weeks then decrease to 2 x a week for 6 weeks.    Dressing  change dressing to silverhydrofiber, 4x4                    PT Short Term Goals - 04/07/17 1123      PT SHORT TERM GOAL #1   Title Pt wound on his right foot to be 80% granulated to decrease risk of infection   Time 3   Period Weeks   Status On-going     PT SHORT TERM GOAL #2   Title Pt wound depth on his right foot  to be no greater than one centimeter to show signs of healing    Time 3   Period Weeks   Status On-going     PT SHORT TERM GOAL #3   Title Pt undermining in his Rt foot wound  to be no greater than on centimeter to show signs of healing.l    Time 3   Period Weeks   Status On-going           PT Long Term Goals - 04/07/17 1123      PT LONG TERM GOAL #1   Title PT right foot wound to be 100% granulated to decrease risk of infection.    Time 6   Period Weeks   Status On-going     PT LONG TERM GOAL #2   Title Pt wound on his Right foot depth to be no greater than .2 cm to decrease risk of infection.    Time 6   Period Weeks   Status On-going     PT LONG TERM GOAL #3   Title Pt wound to no longer be undermining.    Time 8   Period Weeks   Status On-going     PT LONG TERM GOAL #4   Title PT wound size to be no greater than .3x .5 and have no depth to allow pt to care for wound at home.    Time 8   Period Weeks   Status On-going               Plan - 04/14/17 1205    Clinical Impression Statement as above    Rehab Potential Fair   PT Frequency 3x / week  3 x a weeek for 2 weeks followed by 2x a week for 6 weeks    PT Duration 8 weeks   PT Treatment/Interventions ADLs/Self Care Home Management;Other (comment)   debridement and dressing change.    PT Next Visit Plan Continue with pulse lavage, debridement and dressing change.   Consulted and Agree with Plan of Care Patient      Patient will benefit from skilled therapeutic intervention in order to improve the following deficits and impairments:  Abnormal gait, Decreased activity tolerance, Decreased balance, Other (comment), Pain, Difficulty walking (nonhealing wound.)  Visit Diagnosis: Difficulty in walking, not elsewhere classified  Localized edema  Pain in right foot  Diabetic ulcer of right foot associated with diabetes mellitus due to underlying condition, with necrosis of muscle, unspecified part of foot [facility])     Problem List Patient Active Problem List   Diagnosis Date Noted  . Osteomyelitis due to type 2 diabetes mellitus ([place])   . Diabetic foot infection ([place]) 02/07/2017  . Type 2 diabetes mellitus with left diabetic foot infection ([place]) 02/07/2017  . Uncontrolled type 2 diabetes mellitus with hyperglycemia, with long-term current use of insulin ([place]) 02/07/2017  . Cellulitis of left foot 12/12/2016  . Nausea & vomiting 02/24/2016  . A-fib ([place]) 02/23/2016  . Hypothyroidism 02/23/2016  . CKD (chronic kidney disease), stage III 02/23/2016  . Anxiety 02/23/2016  . Chronic combined systolic (congestive) and diastolic (congestive) heart failure ([place]) 02/23/2016  . Dizziness 02/23/2016  . Open wound of right foot 02/23/2016  . Rectal bleeding 07/28/2015  . Chronic anticoagulation 07/28/2015  . PVD (peripheral vascular disease) ([place])   . S/P angioplasty 04/23/2015  . Peripheral arterial occlusive disease ([place]) 04/23/2015  . PAD (peripheral artery disease) ([place])   . Critical lower limb ischemia   . Toe  ulcer, right ([place])   . Type II or unspecified type diabetes mellitus without mention of complication, uncontrolled 11/27/2013  . Acute respiratory failure with hypoxia ([place]) 11/26/2013  . CAP (community acquired pneumonia)  11/26/2013  . CHF exacerbation ([place]) 11/25/2013  . Cardiomyopathy, ischemic 06/13/2013  . At risk for sudden cardiac death Jun 29, 2013  . S/P cardiac catheterization, Rt & Lt heart cath 06/05/2013 06-29-2013  . Acute on chronic combined systolic and diastolic HF (heart failure), NYHA class 3 ([place]) 06/04/2013  . Atrial flutter with rapid ventricular response ([place]) 06/02/2013  . Acute renal failure: Cr up to 1.8 06/02/2013  . Hyperkalemia 06/02/2013    Class: Acute  . LBBB (left bundle branch block) 06/01/2013  . Hypotension- secondary to AF and Diltiazem Rx 06/01/2013  . Obesity (BMI 30-39.9) 06/01/2013  . Acute on chronic diastolic heart failure - due to Afib AVR 06/01/2013  . Atrial fibrillation with RVR- converted with Amiodarone 05/31/2013  . Stroke- Lt brain 2003 04/08/2013  . Hemiplegia affecting right dominant side ([place]) 04/08/2013  . Cataract 04/08/2013  . GERD (gastroesophageal reflux disease) 04/08/2013  . S/P CABG x 3 - 2010 04/08/2013  . S/P AVR-tissue, 2010 04/08/2013  . DM2 (diabetes mellitus, type 2) ([place]) 04/08/2013  . Dyslipidemia 04/08/2013  . SUBACROMIAL BURSITIS, LEFT 11/05/2009    Rayetta Humphrey, PT CLT [phone] 04/14/2017, 12:07 PM  [facility] [address] [place], [name], [address] Phone: [phone]   Fax:  [phone]  Name: [name] MRN: [id number] Date of Birth: 1946/05/15

## 2017-04-14 NOTE — Telephone Encounter (Signed)
04/14/17  Pt cx Tue and Thurs appt since he will be out of town... Scheduled another appt for 9/7

## 2017-04-18 ENCOUNTER — Other Ambulatory Visit: Payer: Self-pay | Admitting: Internal Medicine

## 2017-04-18 ENCOUNTER — Ambulatory Visit (HOSPITAL_COMMUNITY): Payer: Medicare Other | Admitting: Physical Therapy

## 2017-04-18 DIAGNOSIS — E1169 Type 2 diabetes mellitus with other specified complication: Secondary | ICD-10-CM

## 2017-04-18 DIAGNOSIS — M869 Osteomyelitis, unspecified: Principal | ICD-10-CM

## 2017-04-18 NOTE — Telephone Encounter (Signed)
Patient was given 1 month of oral antibiotic and told to follow up in 1 month. He did not make a followup appointment on his way out. Do you want to refill the [place] and have him back or has he had sufficient treatment?

## 2017-04-20 ENCOUNTER — Ambulatory Visit (HOSPITAL_COMMUNITY): Payer: Medicare Other | Admitting: Physical Therapy

## 2017-04-21 ENCOUNTER — Ambulatory Visit (HOSPITAL_COMMUNITY): Payer: Medicare Other | Attending: Internal Medicine | Admitting: Physical Therapy

## 2017-04-21 DIAGNOSIS — E08621 Diabetes mellitus due to underlying condition with foot ulcer: Secondary | ICD-10-CM | POA: Diagnosis not present

## 2017-04-21 DIAGNOSIS — R262 Difficulty in walking, not elsewhere classified: Secondary | ICD-10-CM | POA: Diagnosis not present

## 2017-04-21 DIAGNOSIS — L97513 Non-pressure chronic ulcer of other part of right foot with necrosis of muscle: Secondary | ICD-10-CM | POA: Insufficient documentation

## 2017-04-21 DIAGNOSIS — M79671 Pain in right foot: Secondary | ICD-10-CM

## 2017-04-21 DIAGNOSIS — R600 Localized edema: Secondary | ICD-10-CM

## 2017-04-21 NOTE — Therapy (Signed)
[place], [name], [address] Phone: [phone]   Fax:  [phone]  Wound Care Therapy  Patient Details  Name: [name] MRN: [id number] Date of Birth: [birth year] Referring Provider: Nicholes Mango  Encounter Date: 04/21/2017      PT End of Session - 04/21/17 0957    Visit Number 7   Number of Visits 16   Date for PT Re-Evaluation 05/03/17   Authorization Type [place] - Visit Number 7   Authorization - Number of Visits 10   PT Start Time [phone]   PT Stop Time 0900   PT Time Calculation (min) 37 min   Activity Tolerance Patient tolerated treatment well;No increased pain   Behavior During Therapy WFL for tasks assessed/performed      Past Medical History:  Diagnosis Date  . At risk for sudden cardiac death 2013-06-24  . Cataract   . Chronic anticoagulation 05/2013   started  . Chronic kidney disease    Patient reports he is seeing Kidney doctor in September  . Coronary artery disease   . Diabetes mellitus    type 2   . Dyslipidemia   . GERD (gastroesophageal reflux disease)   . History of valve replacement 2010   [facility] Ease bioprosthetic 11mm  . Hypertension   . PAF (paroxysmal atrial fibrillation) ([place]) 05/2013   converted with amiodarone to SR  . S/P CABG x 3 2010  . S/P cardiac catheterization, Rt & Lt heart cath 06/05/2013 June 24, 2013  . Stroke [facility]) 2003   R-sided weakness & upper extremity swelling   . Thyroid disease     Past Surgical History:  Procedure Laterality Date  . AORTIC VALVE REPLACEMENT  01/26/2009   [facility] Ease bioprosthetic 60mm valve  . CARDIAC CATHETERIZATION  06/05/2013   native 3 vessel disease; patent LIMA to LAD, SVG to OM and SVG to PDA; Elevated right and left heart filling pressures, with predominantly pulmonary venous hypertension, normal PVR  . COLONOSCOPY  2007   Dr. Aviva Signs: internal hemorrhoids  . COLONOSCOPY  N/A 06/16/2016   Procedure: COLONOSCOPY;  Surgeon: Rogene Houston, MD;  [facility];  Service: Endoscopy;  Laterality: N/A;  1:45  . CORONARY ARTERY BYPASS GRAFT  01/26/2009   LIMA to LAD, SVG to circumflex, SVG to PDA  . GRAFT(S) ANGIOGRAM  06/05/2013   Procedure: GRAFT(S) Cyril Loosen;  Surgeon: Leonie Man, MD;  Location: [facility] CATH LAB;  Service: Cardiovascular;;  . IR ANGIOGRAM EXTREMITY LEFT  02/10/2017  . IR ANGIOGRAM EXTREMITY LEFT  02/27/2017  . IR FEM POP ART ATHERECT INC PTA MOD SED  02/10/2017  . IR GENERIC HISTORICAL  05/12/2016   IR RADIOLOGIST EVAL & MGMT 05/12/2016 Jacqulynn Cadet, MD [facility] INTERV RAD  . IR RADIOLOGIST EVAL & MGMT  01/04/2017  . IR US GUIDE VASC ACCESS LEFT  02/27/2017  . IR US GUIDE VASC ACCESS LEFT  02/27/2017  . IR US GUIDE VASC ACCESS RIGHT  02/10/2017  . LEFT AND RIGHT HEART CATHETERIZATION WITH CORONARY ANGIOGRAM N/A 06/05/2013   Procedure: LEFT AND RIGHT HEART CATHETERIZATION WITH CORONARY ANGIOGRAM;  Surgeon: Leonie Man, MD;  Location: [facility] CATH LAB;  Service: Cardiovascular;  Laterality: N/A;  . TRANSTHORACIC ECHOCARDIOGRAM  09/2011   grade 1 diastolic dysfunction; increasing valve gradient; calcified MV annulus   . TRANSTHORACIC ECHOCARDIOGRAM  06/03/2013   EF 25-30%, mod conc. hypertrophy, grade 3 diastolic dysfunction; LA mod dilated;  calcified MV annulus; transaortic gradients are normal for the bioprosthetic valve; inf vena cava dilated (elevated CVP) - LifeVest    There were no vitals filed for this visit.                  Wound Therapy - 04/21/17 0947    Subjective Pt has been out of town visiting his sister.  States that his Lt foot keeps swelling but denies any wound.  States that he is out of antibiotic.     Patient and Family Stated Goals wound to heal    Date of Onset 01/13/17   Prior Treatments self care    Pain Assessment 0-10   Pain Score 4    Pain Location Foot   Pain Orientation Right   Pain Descriptors /  Indicators Discomfort   Pain Onset On-going   Patients Stated Pain Goal 0   Pain Intervention(s) Emotional support   Multiple Pain Sites No   Evaluation and Treatment Procedures Explained to Patient/Family Yes   Evaluation and Treatment Procedures agreed to   Wound Properties Date First Assessed: 04/03/17 Time First Assessed: 0814 Wound Type: Non-pressure wound Location Orientation: Right Wound Description (Comments): lateral aspect of right metatarsal  Present on Admission: Yes   Dressing Type Gauze (Comment)   Dressing Changed Changed   Dressing Status Old drainage   Dressing Change Frequency Monday, Wednesday, Friday  for 2 weeks thane 2x a week for 6 weeks.    Site / Wound Assessment Yellow   % Wound base Red or Granulating 0%   % Wound base Yellow/Fibrinous Exudate 95%   Peri-wound Assessment Intact;Erythema (blanchable)  Now entire little toe is affected.   Wound Length (cm) 4 cm   Wound Width (cm) 3.7 cm   Wound Depth (cm) --  at least 2    Undermining (cm) all aspects    Drainage Amount Minimal   Drainage Description Sanguineous;No odor   Treatment Cleansed;Debridement (Selective)   Pulsed lavage therapy - wound location --   Pulsed Lavage with Suction (psi) --   Pulsed Lavage with Suction - Normal Saline Used --   Pulsed Lavage Tip --   Selective Debridement - Location Rt lateral foot, 5th MTP;and Little toe    Selective Debridement - Tools Used Forceps;Scissors   Selective Debridement - Tissue Removed dead skin, slough and eschar    Wound Therapy - Clinical Statement Wound no longer present between the fith and fourth toe. But little toe is now totally red with the first layer of skin coming off with debridement.  Therapist noted a small pinhole opening just distal to wound bed which pus was able to be distracted out.  Redness is now increasing in the platar aspect of pt foot as well.  Therapist urged pt to go to the ER three times due to increased swelling in Left foot  which pt was previously hospitalized for and Rt foot having increased redness with no signs of improvement therapist believes that pt needs IV antibiotics once again.  Pt reluctant to go to the ER therapist again urged pt to go to the ER explaining that cellulitis can be a life threatening condition.    Wound Therapy - Functional Problem List difficulty walking    Factors Delaying/Impairing Wound Healing Altered sensation;Diabetes Mellitus;Infection - systemic/local;Immobility;Multiple medical problems;Polypharmacy;Vascular compromise   Hydrotherapy Plan Debridement;Dressing change;Patient/family education;Pulsatile lavage with suction   Wound Therapy - Frequency --  3x/week for 2 weeks then reduce to 2x/week for 6 weeks  Wound Therapy - Current Recommendations PT   Wound Plan See pt 3x a week for the first 2 weeks then decrease to 2 x a week for 6 weeks.    Dressing  silverhydrofiber to lateral aspect of 5th MTP with honey on little toe followed by 4x4 and kling.                    PT Short Term Goals - 04/07/17 1123      PT SHORT TERM GOAL #1   Title Pt wound on his right foot to be 80% granulated to decrease risk of infection   Time 3   Period Weeks   Status On-going     PT SHORT TERM GOAL #2   Title Pt wound depth on his right foot  to be no greater than one centimeter to show signs of healing    Time 3   Period Weeks   Status On-going     PT SHORT TERM GOAL #3   Title Pt undermining in his Rt foot wound  to be no greater than on centimeter to show signs of healing.l    Time 3   Period Weeks   Status On-going           PT Long Term Goals - 04/07/17 1123      PT LONG TERM GOAL #1   Title PT right foot wound to be 100% granulated to decrease risk of infection.    Time 6   Period Weeks   Status On-going     PT LONG TERM GOAL #2   Title Pt wound on his Right foot depth to be no greater than .2 cm to decrease risk of infection.    Time 6   Period Weeks    Status On-going     PT LONG TERM GOAL #3   Title Pt wound to no longer be undermining.    Time 8   Period Weeks   Status On-going     PT LONG TERM GOAL #4   Title PT wound size to be no greater than .3x .5 and have no depth to allow pt to care for wound at home.    Time 8   Period Weeks   Status On-going               Plan - 04/21/17 0958    Clinical Impression Statement see above    Rehab Potential Fair   PT Frequency 3x / week  3 x a weeek for 2 weeks followed by 2x a week for 6 weeks    PT Duration 8 weeks   PT Treatment/Interventions ADLs/Self Care Home Management;Other (comment)  debridement and dressing change.    PT Next Visit Plan discontinue pulse lavage at this time may return if needed.    Consulted and Agree with Plan of Care Patient      Patient will benefit from skilled therapeutic intervention in order to improve the following deficits and impairments:  Abnormal gait, Decreased activity tolerance, Decreased balance, Other (comment), Pain, Difficulty walking (nonhealing wound.)  Visit Diagnosis: Difficulty in walking, not elsewhere classified  Localized edema  Pain in right foot  Diabetic ulcer of right foot associated with diabetes mellitus due to underlying condition, with necrosis of muscle, unspecified part of foot [facility])     Problem List Patient Active Problem List   Diagnosis Date Noted  . Osteomyelitis due to type 2 diabetes mellitus ([place])   . Diabetic foot infection (  [place]) 02/07/2017  . Type 2 diabetes mellitus with left diabetic foot infection ([place]) 02/07/2017  . Uncontrolled type 2 diabetes mellitus with hyperglycemia, with long-term current use of insulin ([place]) 02/07/2017  . Cellulitis of left foot 12/12/2016  . Nausea & vomiting 02/24/2016  . A-fib ([place]) 02/23/2016  . Hypothyroidism 02/23/2016  . CKD (chronic kidney disease), stage III 02/23/2016  . Anxiety 02/23/2016  . Chronic combined systolic (congestive) and diastolic  (congestive) heart failure ([place]) 02/23/2016  . Dizziness 02/23/2016  . Open wound of right foot 02/23/2016  . Rectal bleeding 07/28/2015  . Chronic anticoagulation 07/28/2015  . PVD (peripheral vascular disease) ([place])   . S/P angioplasty 04/23/2015  . Peripheral arterial occlusive disease ([place]) 04/23/2015  . PAD (peripheral artery disease) ([place])   . Critical lower limb ischemia   . Toe ulcer, right ([place])   . Type II or unspecified type diabetes mellitus without mention of complication, uncontrolled 11/27/2013  . Acute respiratory failure with hypoxia ([place]) 11/26/2013  . CAP (community acquired pneumonia) 11/26/2013  . CHF exacerbation ([place]) 11/25/2013  . Cardiomyopathy, ischemic 06/13/2013  . At risk for sudden cardiac death June 23, 2013  . S/P cardiac catheterization, Rt & Lt heart cath 06/05/2013 2013-06-23  . Acute on chronic combined systolic and diastolic HF (heart failure), NYHA class 3 ([place]) 06/04/2013  . Atrial flutter with rapid ventricular response ([place]) 06/02/2013  . Acute renal failure: Cr up to 1.8 06/02/2013  . Hyperkalemia 06/02/2013    Class: Acute  . LBBB (left bundle branch block) 06/01/2013  . Hypotension- secondary to AF and Diltiazem Rx 06/01/2013  . Obesity (BMI 30-39.9) 06/01/2013  . Acute on chronic diastolic heart failure - due to Afib AVR 06/01/2013  . Atrial fibrillation with RVR- converted with Amiodarone 05/31/2013  . Stroke- Lt brain 2003 04/08/2013  . Hemiplegia affecting right dominant side ([place]) 04/08/2013  . Cataract 04/08/2013  . GERD (gastroesophageal reflux disease) 04/08/2013  . S/P CABG x 3 - 2010 04/08/2013  . S/P AVR-tissue, 2010 04/08/2013  . DM2 (diabetes mellitus, type 2) ([place]) 04/08/2013  . Dyslipidemia 04/08/2013  . SUBACROMIAL BURSITIS, LEFT 11/05/2009   Rayetta Humphrey, PT CLT [phone] 04/21/2017, 9:59 AM  [facility] [address] [place], [name], [address] Phone: [phone]    Fax:  [phone]  Name: [name] MRN: [id number] Date of Birth: 06-Mar-1946

## 2017-04-25 ENCOUNTER — Ambulatory Visit (HOSPITAL_COMMUNITY): Payer: Medicare Other | Admitting: Physical Therapy

## 2017-04-25 DIAGNOSIS — L97513 Non-pressure chronic ulcer of other part of right foot with necrosis of muscle: Secondary | ICD-10-CM | POA: Diagnosis not present

## 2017-04-25 DIAGNOSIS — M79671 Pain in right foot: Secondary | ICD-10-CM | POA: Diagnosis not present

## 2017-04-25 DIAGNOSIS — R262 Difficulty in walking, not elsewhere classified: Secondary | ICD-10-CM

## 2017-04-25 DIAGNOSIS — R600 Localized edema: Secondary | ICD-10-CM

## 2017-04-25 DIAGNOSIS — E08621 Diabetes mellitus due to underlying condition with foot ulcer: Secondary | ICD-10-CM | POA: Diagnosis not present

## 2017-04-25 NOTE — Therapy (Signed)
[place], [name], [address] Phone: [phone]   Fax:  [phone]  Wound Care Therapy  Patient Details  Name: [name] MRN: [id number] Date of Birth: November 05, 1945 Referring Provider: Nicholes Mango  Encounter Date: 04/25/2017      PT End of Session - 04/25/17 1030    Visit Number 8   Number of Visits 16   Date for PT Re-Evaluation 05/03/17   Authorization Type [place] - Visit Number 8   Authorization - Number of Visits 10   PT Start Time 0820   PT Stop Time 3299   PT Time Calculation (min) 35 min   Activity Tolerance Patient tolerated treatment well;No increased pain   Behavior During Therapy WFL for tasks assessed/performed      Past Medical History:  Diagnosis Date  . At risk for sudden cardiac death 06/26/13  . Cataract   . Chronic anticoagulation 05/2013   started  . Chronic kidney disease    Patient reports he is seeing Kidney doctor in September  . Coronary artery disease   . Diabetes mellitus    type 2   . Dyslipidemia   . GERD (gastroesophageal reflux disease)   . History of valve replacement 2010   [facility] Ease bioprosthetic 46mm  . Hypertension   . PAF (paroxysmal atrial fibrillation) ([place]) 05/2013   converted with amiodarone to SR  . S/P CABG x 3 2010  . S/P cardiac catheterization, Rt & Lt heart cath 06/05/2013 June 26, 2013  . Stroke [facility]) 2003   R-sided weakness & upper extremity swelling   . Thyroid disease     Past Surgical History:  Procedure Laterality Date  . AORTIC VALVE REPLACEMENT  01/26/2009   [facility] Ease bioprosthetic 1mm valve  . CARDIAC CATHETERIZATION  06/05/2013   native 3 vessel disease; patent LIMA to LAD, SVG to OM and SVG to PDA; Elevated right and left heart filling pressures, with predominantly pulmonary venous hypertension, normal PVR  . COLONOSCOPY  2007   Dr. Aviva Signs: internal hemorrhoids  . COLONOSCOPY  N/A 06/16/2016   Procedure: COLONOSCOPY;  Surgeon: Rogene Houston, MD;  [facility];  Service: Endoscopy;  Laterality: N/A;  1:45  . CORONARY ARTERY BYPASS GRAFT  01/26/2009   LIMA to LAD, SVG to circumflex, SVG to PDA  . GRAFT(S) ANGIOGRAM  06/05/2013   Procedure: GRAFT(S) Cyril Loosen;  Surgeon: Leonie Man, MD;  Location: [facility] CATH LAB;  Service: Cardiovascular;;  . IR ANGIOGRAM EXTREMITY LEFT  02/10/2017  . IR ANGIOGRAM EXTREMITY LEFT  02/27/2017  . IR FEM POP ART ATHERECT INC PTA MOD SED  02/10/2017  . IR GENERIC HISTORICAL  05/12/2016   IR RADIOLOGIST EVAL & MGMT 05/12/2016 Jacqulynn Cadet, MD [facility] INTERV RAD  . IR RADIOLOGIST EVAL & MGMT  01/04/2017  . IR US GUIDE VASC ACCESS LEFT  02/27/2017  . IR US GUIDE VASC ACCESS LEFT  02/27/2017  . IR US GUIDE VASC ACCESS RIGHT  02/10/2017  . LEFT AND RIGHT HEART CATHETERIZATION WITH CORONARY ANGIOGRAM N/A 06/05/2013   Procedure: LEFT AND RIGHT HEART CATHETERIZATION WITH CORONARY ANGIOGRAM;  Surgeon: Leonie Man, MD;  Location: [facility] CATH LAB;  Service: Cardiovascular;  Laterality: N/A;  . TRANSTHORACIC ECHOCARDIOGRAM  09/2011   grade 1 diastolic dysfunction; increasing valve gradient; calcified MV annulus   . TRANSTHORACIC ECHOCARDIOGRAM  06/03/2013   EF 25-30%, mod conc. hypertrophy, grade 3 diastolic dysfunction; LA mod dilated;  calcified MV annulus; transaortic gradients are normal for the bioprosthetic valve; inf vena cava dilated (elevated CVP) - LifeVest    There were no vitals filed for this visit.                  Wound Therapy - 04/25/17 1025    Subjective Pt states he's getting evicted from his house.  States his foot does not hurt like it did.  MD gave him more oral antibiotics.  STates he did not go to the ED   Patient and Family Stated Goals wound to heal    Date of Onset 01/13/17   Prior Treatments self care    Pain Assessment No/denies pain   Pain Score 3   when he lays down at night   Pain Location  Foot   Pain Orientation Right   Pain Descriptors / Indicators Aching;Burning;Discomfort   Pain Onset Awakened from sleep   Patients Stated Pain Goal 0   Pain Intervention(s) Other (Comment)  weight bearing   Multiple Pain Sites No   Evaluation and Treatment Procedures Explained to Patient/Family Yes   Evaluation and Treatment Procedures agreed to   Wound Properties Date First Assessed: 04/03/17 Time First Assessed: 0814 Wound Type: Non-pressure wound Location Orientation: Right Wound Description (Comments): lateral aspect of right metatarsal  Present on Admission: Yes   Dressing Type Gauze (Comment)   Dressing Changed Changed   Dressing Status Old drainage   Dressing Change Frequency Monday, Wednesday, Friday  for 2 weeks thane 2x a week for 6 weeks.    Site / Wound Assessment Yellow   % Wound base Red or Granulating 0%   % Wound base Yellow/Fibrinous Exudate 95%   Peri-wound Assessment Intact;Erythema (blanchable)  Now entire little toe is affected.   Drainage Amount Minimal   Drainage Description Sanguineous;No odor   Treatment Cleansed;Debridement (Selective)   Selective Debridement - Location Rt lateral foot, 5th MTP;and Little toe    Selective Debridement - Tools Used Forceps;Scissors   Selective Debridement - Tissue Removed dead skin, slough and eschar    Wound Therapy - Clinical Statement small toe remains fragile with futher debridement of dead skin.  Toenail also loose and unattached in most areas appearting to eventually fall off.  Pt also with raw area on great toe but no opening.  cleansed all areas well and debrided lateral foot with attempt to remove remaining black eschar from perimeter of wound.  No puss noted from small toe.  continued use of silver hydrofiber and honey to toe.     Wound Therapy - Functional Problem List difficulty walking    Factors Delaying/Impairing Wound Healing Altered sensation;Diabetes Mellitus;Infection - systemic/local;Immobility;Multiple  medical problems;Polypharmacy;Vascular compromise   Hydrotherapy Plan Debridement;Dressing change;Patient/family education;Pulsatile lavage with suction   Wound Therapy - Frequency --  3x/week for 2 weeks then reduce to 2x/week for 6 weeks   Wound Therapy - Current Recommendations PT   Wound Plan See pt 3x a week for the first 2 weeks then decrease to 2 x a week for 6 weeks.    Dressing  silverhydrofiber to lateral aspect of 5th MTP with honey on little toe followed by 2X2 anc medipore                   PT Short Term Goals - 04/07/17 1123      PT SHORT TERM GOAL #1   Title Pt wound on his right foot to be 80% granulated to decrease risk of infection  Time 3   Period Weeks   Status On-going     PT SHORT TERM GOAL #2   Title Pt wound depth on his right foot  to be no greater than one centimeter to show signs of healing    Time 3   Period Weeks   Status On-going     PT SHORT TERM GOAL #3   Title Pt undermining in his Rt foot wound  to be no greater than on centimeter to show signs of healing.l    Time 3   Period Weeks   Status On-going           PT Long Term Goals - 04/07/17 1123      PT LONG TERM GOAL #1   Title PT right foot wound to be 100% granulated to decrease risk of infection.    Time 6   Period Weeks   Status On-going     PT LONG TERM GOAL #2   Title Pt wound on his Right foot depth to be no greater than .2 cm to decrease risk of infection.    Time 6   Period Weeks   Status On-going     PT LONG TERM GOAL #3   Title Pt wound to no longer be undermining.    Time 8   Period Weeks   Status On-going     PT LONG TERM GOAL #4   Title PT wound size to be no greater than .3x .5 and have no depth to allow pt to care for wound at home.    Time 8   Period Weeks   Status On-going             Patient will benefit from skilled therapeutic intervention in order to improve the following deficits and impairments:     Visit Diagnosis: Difficulty  in walking, not elsewhere classified  Localized edema  Pain in right foot  Diabetic ulcer of right foot associated with diabetes mellitus due to underlying condition, with necrosis of muscle, unspecified part of foot [facility])     Problem List Patient Active Problem List   Diagnosis Date Noted  . Osteomyelitis due to type 2 diabetes mellitus ([place])   . Diabetic foot infection ([place]) 02/07/2017  . Type 2 diabetes mellitus with left diabetic foot infection ([place]) 02/07/2017  . Uncontrolled type 2 diabetes mellitus with hyperglycemia, with long-term current use of insulin ([place]) 02/07/2017  . Cellulitis of left foot 12/12/2016  . Nausea & vomiting 02/24/2016  . A-fib ([place]) 02/23/2016  . Hypothyroidism 02/23/2016  . CKD (chronic kidney disease), stage III 02/23/2016  . Anxiety 02/23/2016  . Chronic combined systolic (congestive) and diastolic (congestive) heart failure ([place]) 02/23/2016  . Dizziness 02/23/2016  . Open wound of right foot 02/23/2016  . Rectal bleeding 07/28/2015  . Chronic anticoagulation 07/28/2015  . PVD (peripheral vascular disease) ([place])   . S/P angioplasty 04/23/2015  . Peripheral arterial occlusive disease ([place]) 04/23/2015  . PAD (peripheral artery disease) ([place])   . Critical lower limb ischemia   . Toe ulcer, right ([place])   . Type II or unspecified type diabetes mellitus without mention of complication, uncontrolled 11/27/2013  . Acute respiratory failure with hypoxia ([place]) 11/26/2013  . CAP (community acquired pneumonia) 11/26/2013  . CHF exacerbation ([place]) 11/25/2013  . Cardiomyopathy, ischemic 06/13/2013  . At risk for sudden cardiac death 06-21-2013  . S/P cardiac catheterization, Rt & Lt heart cath 06/05/2013 2013/06/21  . Acute on chronic combined systolic and diastolic  HF (heart failure), NYHA class 3 ([place]) 06/04/2013  . Atrial flutter with rapid ventricular response ([place]) 06/02/2013  . Acute renal failure: Cr up to 1.8 06/02/2013  . Hyperkalemia  06/02/2013    Class: Acute  . LBBB (left bundle branch block) 06/01/2013  . Hypotension- secondary to AF and Diltiazem Rx 06/01/2013  . Obesity (BMI 30-39.9) 06/01/2013  . Acute on chronic diastolic heart failure - due to Afib AVR 06/01/2013  . Atrial fibrillation with RVR- converted with Amiodarone 05/31/2013  . Stroke- Lt brain 2003 04/08/2013  . Hemiplegia affecting right dominant side ([place]) 04/08/2013  . Cataract 04/08/2013  . GERD (gastroesophageal reflux disease) 04/08/2013  . S/P CABG x 3 - 2010 04/08/2013  . S/P AVR-tissue, 2010 04/08/2013  . DM2 (diabetes mellitus, type 2) ([place]) 04/08/2013  . Dyslipidemia 04/08/2013  . SUBACROMIAL BURSITIS, LEFT 11/05/2009   Teena Irani, PTA/CLT [phone]   Teena Irani 04/25/2017, 10:31 AM  [place], [name], [address] Phone: [phone]   Fax:  [phone]  Name: ESAU FRIDMAN MRN: [id number] Date of Birth: Feb 07, 1946

## 2017-04-27 ENCOUNTER — Ambulatory Visit (HOSPITAL_COMMUNITY): Payer: Medicare Other | Admitting: Physical Therapy

## 2017-04-27 DIAGNOSIS — R262 Difficulty in walking, not elsewhere classified: Secondary | ICD-10-CM

## 2017-04-27 DIAGNOSIS — R600 Localized edema: Secondary | ICD-10-CM

## 2017-04-27 DIAGNOSIS — M79671 Pain in right foot: Secondary | ICD-10-CM | POA: Diagnosis not present

## 2017-04-27 DIAGNOSIS — L97513 Non-pressure chronic ulcer of other part of right foot with necrosis of muscle: Secondary | ICD-10-CM

## 2017-04-27 DIAGNOSIS — E08621 Diabetes mellitus due to underlying condition with foot ulcer: Secondary | ICD-10-CM | POA: Diagnosis not present

## 2017-04-27 NOTE — Therapy (Signed)
[place], [name], [address] Phone: [phone]   Fax:  [phone]  Wound Care Therapy  Patient Details  Name: [name] MRN: [id number] Date of Birth: [birth year] Referring Provider: Nicholes Mango  Encounter Date: 04/27/2017      PT End of Session - 04/27/17 1034    Visit Number 9   Number of Visits 16   Date for PT Re-Evaluation 05/03/17   Authorization Type [place] - Visit Number 9   Authorization - Number of Visits 10   PT Start Time 0900   PT Stop Time 0930   PT Time Calculation (min) 30 min   Activity Tolerance Patient tolerated treatment well;No increased pain   Behavior During Therapy WFL for tasks assessed/performed      Past Medical History:  Diagnosis Date  . At risk for sudden cardiac death 06/15/13  . Cataract   . Chronic anticoagulation 05/2013   started  . Chronic kidney disease    Patient reports he is seeing Kidney doctor in September  . Coronary artery disease   . Diabetes mellitus    type 2   . Dyslipidemia   . GERD (gastroesophageal reflux disease)   . History of valve replacement 2010   [facility] Ease bioprosthetic 73mm  . Hypertension   . PAF (paroxysmal atrial fibrillation) ([employer]) 05/2013   converted with amiodarone to SR  . S/P CABG x 3 2010  . S/P cardiac catheterization, Rt & Lt heart cath 06/05/2013 Jun 15, 2013  . Stroke [facility]) 2003   R-sided weakness & upper extremity swelling   . Thyroid disease     Past Surgical History:  Procedure Laterality Date  . AORTIC VALVE REPLACEMENT  01/26/2009   [facility] Ease bioprosthetic 8mm valve  . CARDIAC CATHETERIZATION  06/05/2013   native 3 vessel disease; patent LIMA to LAD, SVG to OM and SVG to PDA; Elevated right and left heart filling pressures, with predominantly pulmonary venous hypertension, normal PVR  . COLONOSCOPY  2007   Dr. Aviva Signs: internal hemorrhoids  . COLONOSCOPY  N/A 06/16/2016   Procedure: COLONOSCOPY;  Surgeon: Rogene Houston, MD;  [facility];  Service: Endoscopy;  Laterality: N/A;  1:45  . CORONARY ARTERY BYPASS GRAFT  01/26/2009   LIMA to LAD, SVG to circumflex, SVG to PDA  . GRAFT(S) ANGIOGRAM  06/05/2013   Procedure: GRAFT(S) Cyril Loosen;  Surgeon: Leonie Man, MD;  Location: [facility] CATH LAB;  Service: Cardiovascular;;  . IR ANGIOGRAM EXTREMITY LEFT  02/10/2017  . IR ANGIOGRAM EXTREMITY LEFT  02/27/2017  . IR FEM POP ART ATHERECT INC PTA MOD SED  02/10/2017  . IR GENERIC HISTORICAL  05/12/2016   IR RADIOLOGIST EVAL & MGMT 05/12/2016 Jacqulynn Cadet, MD [facility] INTERV RAD  . IR RADIOLOGIST EVAL & MGMT  01/04/2017  . IR US GUIDE VASC ACCESS LEFT  02/27/2017  . IR US GUIDE VASC ACCESS LEFT  02/27/2017  . IR US GUIDE VASC ACCESS RIGHT  02/10/2017  . LEFT AND RIGHT HEART CATHETERIZATION WITH CORONARY ANGIOGRAM N/A 06/05/2013   Procedure: LEFT AND RIGHT HEART CATHETERIZATION WITH CORONARY ANGIOGRAM;  Surgeon: Leonie Man, MD;  Location: [facility] CATH LAB;  Service: Cardiovascular;  Laterality: N/A;  . TRANSTHORACIC ECHOCARDIOGRAM  09/2011   grade 1 diastolic dysfunction; increasing valve gradient; calcified MV annulus   . TRANSTHORACIC ECHOCARDIOGRAM  06/03/2013   EF 25-30%, mod conc. hypertrophy, grade 3 diastolic dysfunction; LA mod dilated;  calcified MV annulus; transaortic gradients are normal for the bioprosthetic valve; inf vena cava dilated (elevated CVP) - LifeVest    There were no vitals filed for this visit.                  Wound Therapy - 04/27/17 1018    Subjective Pt states [place].  States his foot is not hurting at all now and comes today without his [facility], stating he forgot it in his car.   Patient and Family Stated Goals wound to heal    Date of Onset 01/13/17   Prior Treatments self care    Pain Assessment No/denies pain   Evaluation and Treatment  Procedures Explained to Patient/Family Yes   Evaluation and Treatment Procedures agreed to   Wound Properties Date First Assessed: 04/03/17 Time First Assessed: 0814 Wound Type: Non-pressure wound Location Orientation: Right Wound Description (Comments): lateral aspect of right metatarsal  Present on Admission: Yes   Dressing Type Gauze (Comment)   Dressing Changed Changed   Dressing Status Old drainage   Dressing Change Frequency Monday, Wednesday, Friday  for 2 weeks thane 2x a week for 6 weeks.    Site / Wound Assessment Yellow   % Wound base Red or Granulating 10%   % Wound base Yellow/Fibrinous Exudate 90%   Peri-wound Assessment Intact;Erythema (blanchable)  Now entire little toe is affected.   Drainage Amount Minimal   Drainage Description Sanguineous;No odor   Treatment Cleansed;Debridement (Selective)   Wound Properties Date First Assessed: 04/27/17 Time First Assessed: 0915 Wound Type: Other (Comment) Location: Toe (Comment  which one) Location Orientation: Right Wound Description (Comments): Rt 5th toe at toenail   Dressing Type Silver hydrofiber   Dressing Changed New   Dressing Status Clean;Dry   Dressing Change Frequency PRN   Site / Wound Assessment Clean;Dry   % Wound base Red or Granulating 5%   % Wound base Yellow/Fibrinous Exudate 95%   Peri-wound Assessment Intact;Maceration   Wound Length (cm) 0.6 cm   Wound Width (cm) 0.6 cm   Wound Depth (cm) 0.2 cm   Wound Volume (cm^3) 0.07 cm^3   Wound Surface Area (cm^2) 0.36 cm^2   Margins Unattached edges (unapproximated)   Drainage Amount Moderate   Drainage Description Serosanguineous   Treatment Cleansed;Debridement (Selective)   Wound Properties Date First Assessed: 04/27/17 Time First Assessed: 0915 Wound Type: Other (Comment) Location: Toe (Comment  which one) Location Orientation: Right Wound Description (Comments): great toe Present on Admission: No   Dressing Type Gauze (Comment)  medihoney gel   Dressing  Changed New   Dressing Status Clean;Dry;Intact   Dressing Change Frequency PRN   Site / Wound Assessment Clean;Dry   % Wound base Red or Granulating 0%   % Wound base Yellow/Fibrinous Exudate 100%   Peri-wound Assessment Intact   Wound Length (cm) 0.2 cm   Wound Width (cm) 0.2 cm   Wound Depth (cm) 0.2 cm  at least, unknown   Wound Volume (cm^3) 0.01 cm^3   Wound Surface Area (cm^2) 0.04 cm^2   Margins Unattached edges (unapproximated)   Drainage Amount Minimal   Drainage Description Purulent   Treatment Cleansed;Debridement (Selective)   Selective Debridement - Location Rt lateral foot, 5th MTP;and Little toe    Selective Debridement - Tools Used Forceps;Scissors   Selective Debridement - Tissue Removed dead skin, slough and eschar    Wound Therapy - Clinical Statement Nail on  little toe detached when removing dressing.  Noted maceration and loosening of skin around area but no signs of infecion.  Also noted drainage from medial great toe and upon inspection discovered smaall opening with at least 0.2cm depth and some pus when expressed.  Cleansed all areas well and debrided slough and calloused skin from periemter.  contineud with dressings of silver and medihoney.   Wound Therapy - Functional Problem List difficulty walking    Factors Delaying/Impairing Wound Healing Altered sensation;Diabetes Mellitus;Infection - systemic/local;Immobility;Multiple medical problems;Polypharmacy;Vascular compromise   Hydrotherapy Plan Debridement;Dressing change;Patient/family education;Pulsatile lavage with suction   Wound Therapy - Frequency --  3x/week for 2 weeks then reduce to 2x/week for 6 weeks   Wound Therapy - Current Recommendations PT   Wound Plan See pt 3x a week for the first 2 weeks then decrease to 2 x a week for 6 weeks.    Dressing  5th MTP: silverhydrofiber; 2X2 and medipore   Dressing 5th toe: as above   Dressing great toe:  medihoney gauze packed into opening, gauze and medipore  tape                   PT Short Term Goals - 04/07/17 1123      PT SHORT TERM GOAL #1   Title Pt wound on his right foot to be 80% granulated to decrease risk of infection   Time 3   Period Weeks   Status On-going     PT SHORT TERM GOAL #2   Title Pt wound depth on his right foot  to be no greater than one centimeter to show signs of healing    Time 3   Period Weeks   Status On-going     PT SHORT TERM GOAL #3   Title Pt undermining in his Rt foot wound  to be no greater than on centimeter to show signs of healing.l    Time 3   Period Weeks   Status On-going           PT Long Term Goals - 04/07/17 1123      PT LONG TERM GOAL #1   Title PT right foot wound to be 100% granulated to decrease risk of infection.    Time 6   Period Weeks   Status On-going     PT LONG TERM GOAL #2   Title Pt wound on his Right foot depth to be no greater than .2 cm to decrease risk of infection.    Time 6   Period Weeks   Status On-going     PT LONG TERM GOAL #3   Title Pt wound to no longer be undermining.    Time 8   Period Weeks   Status On-going     PT LONG TERM GOAL #4   Title PT wound size to be no greater than .3x .5 and have no depth to allow pt to care for wound at home.    Time 8   Period Weeks   Status On-going             Patient will benefit from skilled therapeutic intervention in order to improve the following deficits and impairments:     Visit Diagnosis: Difficulty in walking, not elsewhere classified  Localized edema  Pain in right foot  Diabetic ulcer of right foot associated with diabetes mellitus due to underlying condition, with necrosis of muscle, unspecified part of foot [facility])     Problem List Patient Active Problem  List   Diagnosis Date Noted  . Osteomyelitis due to type 2 diabetes mellitus ([place])   . Diabetic foot infection ([place]) 02/07/2017  . Type 2 diabetes mellitus with left diabetic foot infection ([place]) 02/07/2017  .  Uncontrolled type 2 diabetes mellitus with hyperglycemia, with long-term current use of insulin ([place]) 02/07/2017  . Cellulitis of left foot 12/12/2016  . Nausea & vomiting 02/24/2016  . A-fib ([place]) 02/23/2016  . Hypothyroidism 02/23/2016  . CKD (chronic kidney disease), stage III 02/23/2016  . Anxiety 02/23/2016  . Chronic combined systolic (congestive) and diastolic (congestive) heart failure ([place]) 02/23/2016  . Dizziness 02/23/2016  . Open wound of right foot 02/23/2016  . Rectal bleeding 07/28/2015  . Chronic anticoagulation 07/28/2015  . PVD (peripheral vascular disease) ([place])   . S/P angioplasty 04/23/2015  . Peripheral arterial occlusive disease ([place]) 04/23/2015  . PAD (peripheral artery disease) ([place])   . Critical lower limb ischemia   . Toe ulcer, right ([place])   . Type II or unspecified type diabetes mellitus without mention of complication, uncontrolled 11/27/2013  . Acute respiratory failure with hypoxia ([place]) 11/26/2013  . CAP (community acquired pneumonia) 11/26/2013  . CHF exacerbation ([place]) 11/25/2013  . Cardiomyopathy, ischemic 06/13/2013  . At risk for sudden cardiac death 15-Jun-2013  . S/P cardiac catheterization, Rt & Lt heart cath 06/05/2013 June 15, 2013  . Acute on chronic combined systolic and diastolic HF (heart failure), NYHA class 3 ([place]) 06/04/2013  . Atrial flutter with rapid ventricular response ([place]) 06/02/2013  . Acute renal failure: Cr up to 1.8 06/02/2013  . Hyperkalemia 06/02/2013    Class: Acute  . LBBB (left bundle branch block) 06/01/2013  . Hypotension- secondary to AF and Diltiazem Rx 06/01/2013  . Obesity (BMI 30-39.9) 06/01/2013  . Acute on chronic diastolic heart failure - due to Afib AVR 06/01/2013  . Atrial fibrillation with RVR- converted with Amiodarone 05/31/2013  . Stroke- Lt brain 2003 04/08/2013  . Hemiplegia affecting right dominant side ([place]) 04/08/2013  . Cataract 04/08/2013  . GERD (gastroesophageal reflux disease) 04/08/2013  .  S/P CABG x 3 - 2010 04/08/2013  . S/P AVR-tissue, 2010 04/08/2013  . DM2 (diabetes mellitus, type 2) ([place]) 04/08/2013  . Dyslipidemia 04/08/2013  . SUBACROMIAL BURSITIS, LEFT 11/05/2009   Teena Irani, PTA/CLT [phone]  Teena Irani 04/27/2017, 10:36 AM  [place], [name], [address] Phone: [phone]   Fax:  [phone]  Name: ZADOK HOLAWAY MRN: [id number] Date of Birth: 03/29/1946

## 2017-04-28 ENCOUNTER — Other Ambulatory Visit: Payer: Self-pay | Admitting: *Deleted

## 2017-04-28 ENCOUNTER — Ambulatory Visit: Payer: Self-pay | Admitting: *Deleted

## 2017-04-28 NOTE — Patient Outreach (Signed)
[facility]) Care Management  04/28/2017  [name] Jan 05, 1946 [id number]   Care coordination     [facility] CM made call attempts x 2 on 04/27/17 to reach Rickey [name] to check on his availability for the appointment that was scheduled for 04/27/17  [facility] CM was unable to reach him and left voice messages x 2 on 04/27/17 to his mobile number requesting a return call back to [place] mobile number but as of today 04/28/17 no response [facility] CM stopped by [name]'s home today 04/28/17 to check on him for the scheduled appointment and [facility] CM is aware of [name] difficulty with hearing and poor knowledge of use of hie mobile phone [name] vehicle not present in driveway and he did not answer his door when [facility] CM knocked.  [name] had mentioned to [place] that he was informed by his nephew (landlord) that he had to move by the end of the month when [facility] CM last saw him on 04/14/17 but he thought he may be available for his 9/13/4/18 appointment.  [name] reports planning to move to [place] near family [facility] CM called and left a voice message on [name] mobile number and a message was left on his niece, [name]'s number requesting a return call back to [facility] CM mobile number    [facility] CM will continue to reach out to [name] via his mobile numbers, send an [facility] unsuccessful out reach letter if no response and prepare for case closure if no response Routed this note to pcp and other EPIC care team members   [place] L. Lavina Hamman, RN, BSN, [place] Care Management [phone]

## 2017-05-01 ENCOUNTER — Telehealth (HOSPITAL_COMMUNITY): Payer: Self-pay | Admitting: Physical Therapy
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
## 2017-05-02 ENCOUNTER — Ambulatory Visit (HOSPITAL_COMMUNITY): Payer: Medicare Other | Admitting: Physical Therapy

## 2017-05-04 ENCOUNTER — Ambulatory Visit (HOSPITAL_COMMUNITY): Payer: Medicare Other | Admitting: Physical Therapy

## 2017-05-04 DIAGNOSIS — E08621 Diabetes mellitus due to underlying condition with foot ulcer: Secondary | ICD-10-CM

## 2017-05-04 DIAGNOSIS — L97513 Non-pressure chronic ulcer of other part of right foot with necrosis of muscle: Secondary | ICD-10-CM | POA: Diagnosis not present

## 2017-05-04 DIAGNOSIS — R600 Localized edema: Secondary | ICD-10-CM

## 2017-05-04 DIAGNOSIS — R262 Difficulty in walking, not elsewhere classified: Secondary | ICD-10-CM

## 2017-05-04 DIAGNOSIS — M79671 Pain in right foot: Secondary | ICD-10-CM

## 2017-05-04 NOTE — Therapy (Signed)
[place], [name], [address] Phone: [phone]   Fax:  [phone]  Wound Care Therapy  Patient Details  Name: [name] MRN: [id number] Date of Birth: 15-Dec-1945 Referring Provider: Nicholes Mango  Encounter Date: 05/04/2017      PT End of Session - 05/04/17 1117    Visit Number 10   Number of Visits 16   Date for PT Re-Evaluation 05/26/17   Authorization Type [place] - Visit Number 10   Authorization - Number of Visits 10   PT Start Time [phone]   PT Stop Time 1000   PT Time Calculation (min) 44 min   Activity Tolerance Patient tolerated treatment well;No increased pain   Behavior During Therapy WFL for tasks assessed/performed      Past Medical History:  Diagnosis Date  . At risk for sudden cardiac death Jul 02, 2013  . Cataract   . Chronic anticoagulation 05/2013   started  . Chronic kidney disease    Patient reports he is seeing Kidney doctor in September  . Coronary artery disease   . Diabetes mellitus    type 2   . Dyslipidemia   . GERD (gastroesophageal reflux disease)   . History of valve replacement 2010   [facility] Ease bioprosthetic 53mm  . Hypertension   . PAF (paroxysmal atrial fibrillation) ([place]) 05/2013   converted with amiodarone to SR  . S/P CABG x 3 2010  . S/P cardiac catheterization, Rt & Lt heart cath 06/05/2013 02-Jul-2013  . Stroke [facility]) 2003   R-sided weakness & upper extremity swelling   . Thyroid disease     Past Surgical History:  Procedure Laterality Date  . AORTIC VALVE REPLACEMENT  01/26/2009   [facility] Ease bioprosthetic 62mm valve  . CARDIAC CATHETERIZATION  06/05/2013   native 3 vessel disease; patent LIMA to LAD, SVG to OM and SVG to PDA; Elevated right and left heart filling pressures, with predominantly pulmonary venous hypertension, normal PVR  . COLONOSCOPY  2007   Dr. Aviva Signs: internal hemorrhoids  . COLONOSCOPY  N/A 06/16/2016   Procedure: COLONOSCOPY;  Surgeon: Rogene Houston, MD;  Location[facility];  Service: Endoscopy;  Laterality: N/A;  1:45  . CORONARY ARTERY BYPASS GRAFT  01/26/2009   LIMA to LAD, SVG to circumflex, SVG to PDA  . GRAFT(S) ANGIOGRAM  06/05/2013   Procedure: GRAFT(S) Cyril Loosen;  Surgeon: Leonie Man, MD;  Location: [facility] CATH LAB;  Service: Cardiovascular;;  . IR ANGIOGRAM EXTREMITY LEFT  02/10/2017  . IR ANGIOGRAM EXTREMITY LEFT  02/27/2017  . IR FEM POP ART ATHERECT INC PTA MOD SED  02/10/2017  . IR GENERIC HISTORICAL  05/12/2016   IR RADIOLOGIST EVAL & MGMT 05/12/2016 Jacqulynn Cadet, MD [facility] INTERV RAD  . IR RADIOLOGIST EVAL & MGMT  01/04/2017  . IR US GUIDE VASC ACCESS LEFT  02/27/2017  . IR US GUIDE VASC ACCESS LEFT  02/27/2017  . IR US GUIDE VASC ACCESS RIGHT  02/10/2017  . LEFT AND RIGHT HEART CATHETERIZATION WITH CORONARY ANGIOGRAM N/A 06/05/2013   Procedure: LEFT AND RIGHT HEART CATHETERIZATION WITH CORONARY ANGIOGRAM;  Surgeon: Leonie Man, MD;  Location: [facility] CATH LAB;  Service: Cardiovascular;  Laterality: N/A;  . TRANSTHORACIC ECHOCARDIOGRAM  09/2011   grade 1 diastolic dysfunction; increasing valve gradient; calcified MV annulus   . TRANSTHORACIC ECHOCARDIOGRAM  06/03/2013   EF 25-30%, mod conc. hypertrophy, grade 3 diastolic dysfunction; LA mod dilated;  calcified MV annulus; transaortic gradients are normal for the bioprosthetic valve; inf vena cava dilated (elevated CVP) - LifeVest    There were no vitals filed for this visit.       Subjective Assessment - 05/04/17 1109    Subjective Pt states he may be moving soon.                   Wound Therapy - 05/04/17 1109    Subjective [place].  Reports his sister changed his bandages for him while he was away.  [place] and [place] change or add anything.   Patient and Family Stated Goals wound to heal    Date of Onset  01/13/17   Prior Treatments self care    Pain Assessment No/denies pain   Evaluation and Treatment Procedures Explained to Patient/Family Yes   Evaluation and Treatment Procedures agreed to   Wound Properties Date First Assessed: 04/03/17 Time First Assessed: 0814 Wound Type: Non-pressure wound Location Orientation: Right Wound Description (Comments): lateral aspect of right metatarsal  Present on Admission: Yes   Dressing Type Gauze (Comment)   Dressing Changed Changed   Dressing Status Old drainage   Dressing Change Frequency Monday, Wednesday, Friday  for 2 weeks thane 2x a week for 6 weeks.    Site / Wound Assessment Yellow   % Wound base Red or Granulating 10%   % Wound base Yellow/Fibrinous Exudate 90%   Peri-wound Assessment Intact;Erythema (blanchable)  Now entire little toe is affected.   Wound Length (cm) 3.5 cm   Wound Width (cm) 3.5 cm   Wound Depth (cm) 0.5 cm   Wound Volume (cm^3) 6.12 cm^3   Wound Surface Area (cm^2) 12.25 cm^2   Drainage Amount Minimal   Drainage Description Sanguineous;No odor   Treatment Cleansed;Debridement (Selective)   Wound Properties Date First Assessed: 04/27/17 Time First Assessed: 0915 Wound Type: Other (Comment) Location: Toe (Comment  which one) Location Orientation: Right Wound Description (Comments): Rt 5th toe at toenail   Dressing Type Silver hydrofiber   Dressing Changed Changed   Dressing Status Clean;Dry   Dressing Change Frequency PRN   Site / Wound Assessment Clean;Dry   % Wound base Red or Granulating 80%   % Wound base Yellow/Fibrinous Exudate 20%   Peri-wound Assessment Intact;Maceration   Wound Length (cm) 1 cm   Wound Width (cm) 0.7 cm   Wound Depth (cm) 0 cm   Wound Volume (cm^3) 0 cm^3   Wound Surface Area (cm^2) 0.7 cm^2   Margins Attached edges (approximated)   Drainage Amount Minimal   Drainage Description Serosanguineous   Treatment Cleansed;Debridement (Selective)   Wound Properties Date First Assessed:  04/27/17 Time First Assessed: 0915 Wound Type: Other (Comment) Location: Toe (Comment  which one) Location Orientation: Right Wound Description (Comments): great toe Present on Admission: No   Dressing Type Gauze (Comment)  medihoney gel   Dressing Changed Changed   Dressing Status Clean;Dry;Intact   Dressing Change Frequency PRN   Site / Wound Assessment Clean;Dry   % Wound base Red or Granulating 20%   % Wound base Yellow/Fibrinous Exudate --   % Wound base Other/Granulation Tissue (Comment) --  callous 80%   Peri-wound Assessment Intact   Wound Length (cm) 0.7 cm   Wound Width (cm) 0.7 cm   Wound Depth (cm) 0.1 cm   Wound Volume (cm^3) 0.05 cm^3   Wound  Surface Area (cm^2) 0.49 cm^2   Margins Unattached edges (unapproximated)   Drainage Amount Minimal   Drainage Description Serosanguineous   Treatment Cleansed;Debridement (Selective)   Wound Properties Date First Assessed: 05/04/17 Time First Assessed: 0930 Wound Type: Diabetic ulcer Location: Toe (Comment  which one) Location Orientation: Right Wound Description (Comments): medial border of 5th toe Present on Admission: No   Dressing Type Silver hydrofiber   Dressing Changed New   Dressing Status Clean;Dry;Intact   Dressing Change Frequency PRN   Site / Wound Assessment Red;Pink   % Wound base Red or Granulating 50%   % Wound base Yellow/Fibrinous Exudate 50%   Peri-wound Assessment Maceration;Pink   Wound Length (cm) 0.5 cm   Wound Width (cm) 0.5 cm   Wound Depth (cm) 0 cm   Wound Volume (cm^3) 0 cm^3   Wound Surface Area (cm^2) 0.25 cm^2   Drainage Amount Minimal   Drainage Description Serous   Treatment Cleansed;Debridement (Selective)   Selective Debridement - Location Rt lateral foot, 5th MTP;and Little toe    Selective Debridement - Tools Used Forceps;Scissors   Selective Debridement - Tissue Removed dead skin, slough and eschar    Wound Therapy - Clinical Statement [place].  During this time, patient went to ED without any changes made to POC and had his sister change is bandages for him.  Pt now with sore on medial surface of 5th toe that was raw initially but now with distinct borders.  All areas with exception of lateral foot wound has increased granlualtion and increased wound borders due to debridement.  Pt will need continued skilled woundcare.   Wound Therapy - Functional Problem List difficulty walking    Factors Delaying/Impairing Wound Healing Altered sensation;Diabetes Mellitus;Infection - systemic/local;Immobility;Multiple medical problems;Polypharmacy;Vascular compromise   Hydrotherapy Plan Debridement;Dressing change;Patient/family education;Pulsatile lavage with suction   Wound Therapy - Frequency --  3x/week for 2 weeks then reduce to 2x/week for 6 weeks   Wound Therapy - Current Recommendations PT   Wound Plan See pt 3x a week for the first 2 weeks then decrease to 2 x a week for 6 weeks.    Dressing  5th MTP: silverhydrofiber; 2X2 and medipore   Dressing 5th toe: as above                   PT Short Term Goals - 04/07/17 1123      PT SHORT TERM GOAL #1   Title Pt wound on his right foot to be 80% granulated to decrease risk of infection   Time 3   Period Weeks   Status On-going     PT SHORT TERM GOAL #2   Title Pt wound depth on his right foot  to be no greater than one centimeter to show signs of healing    Time 3   Period Weeks   Status On-going     PT SHORT TERM GOAL #3   Title Pt undermining in his Rt foot wound  to be no greater than on centimeter to show signs of healing.l    Time 3   Period Weeks   Status On-going           PT Long Term Goals - 04/07/17 1123      PT LONG TERM GOAL #1   Title PT right foot wound to be 100% granulated to decrease risk of infection.    Time 6   Period  Weeks   Status On-going     PT LONG TERM GOAL #2   Title Pt wound on his Right foot depth to be no greater  than .2 cm to decrease risk of infection.    Time 6   Period Weeks   Status On-going     PT LONG TERM GOAL #3   Title Pt wound to no longer be undermining.    Time 8   Period Weeks   Status On-going     PT LONG TERM GOAL #4   Title PT wound size to be no greater than .3x .5 and have no depth to allow pt to care for wound at home.    Time 8   Period Weeks   Status On-going      [phone]  CM [phone] CJ       Patient will benefit from skilled therapeutic intervention in order to improve the following deficits and impairments:     Visit Diagnosis: Difficulty in walking, not elsewhere classified  Localized edema  Pain in right foot  Diabetic ulcer of right foot associated with diabetes mellitus due to underlying condition, with necrosis of muscle, unspecified part of foot [facility])     Problem List Patient Active Problem List   Diagnosis Date Noted  . Osteomyelitis due to type 2 diabetes mellitus ([place])   . Diabetic foot infection ([place]) 02/07/2017  . Type 2 diabetes mellitus with left diabetic foot infection ([place]) 02/07/2017  . Uncontrolled type 2 diabetes mellitus with hyperglycemia, with long-term current use of insulin ([place]) 02/07/2017  . Cellulitis of left foot 12/12/2016  . Nausea & vomiting 02/24/2016  . A-fib ([place]) 02/23/2016  . Hypothyroidism 02/23/2016  . CKD (chronic kidney disease), stage III 02/23/2016  . Anxiety 02/23/2016  . Chronic combined systolic (congestive) and diastolic (congestive) heart failure ([place]) 02/23/2016  . Dizziness 02/23/2016  . Open wound of right foot 02/23/2016  . Rectal bleeding 07/28/2015  . Chronic anticoagulation 07/28/2015  . PVD (peripheral vascular disease) ([place])   . S/P angioplasty 04/23/2015  . Peripheral arterial occlusive disease ([place]) 04/23/2015  . PAD (peripheral artery disease) ([place])   . Critical lower limb ischemia   . Toe ulcer, right ([place])   . Type II or unspecified type diabetes mellitus without mention of  complication, uncontrolled 11/27/2013  . Acute respiratory failure with hypoxia ([place]) 11/26/2013  . CAP (community acquired pneumonia) 11/26/2013  . CHF exacerbation ([place]) 11/25/2013  . Cardiomyopathy, ischemic 06/13/2013  . At risk for sudden cardiac death 2013/07/05  . S/P cardiac catheterization, Rt & Lt heart cath 06/05/2013 05-Jul-2013  . Acute on chronic combined systolic and diastolic HF (heart failure), NYHA class 3 ([place]) 06/04/2013  . Atrial flutter with rapid ventricular response ([place]) 06/02/2013  . Acute renal failure: Cr up to 1.8 06/02/2013  . Hyperkalemia 06/02/2013    Class: Acute  . LBBB (left bundle branch block) 06/01/2013  . Hypotension- secondary to AF and Diltiazem Rx 06/01/2013  . Obesity (BMI 30-39.9) 06/01/2013  . Acute on chronic diastolic heart failure - due to Afib AVR 06/01/2013  . Atrial fibrillation with RVR- converted with Amiodarone 05/31/2013  . Stroke- Lt brain 2003 04/08/2013  . Hemiplegia affecting right dominant side ([place]) 04/08/2013  . Cataract 04/08/2013  . GERD (gastroesophageal reflux disease) 04/08/2013  . S/P CABG x 3 - 2010 04/08/2013  . S/P AVR-tissue, 2010 04/08/2013  . DM2 (diabetes mellitus, type 2) ([place]) 04/08/2013  . Dyslipidemia 04/08/2013  . SUBACROMIAL BURSITIS, LEFT 11/05/2009  Teena Irani, PTA/CLT [place], PT CLT [phone] 05/04/2017, 11:47 AM  [place] [address] [place], [name], [address] Phone: [phone]   Fax:  [phone]  Name: [name] MRN: [id number] Date of Birth: 1945-10-23

## 2017-05-08 ENCOUNTER — Other Ambulatory Visit: Payer: Self-pay | Admitting: *Deleted

## 2017-05-08 NOTE — Patient Outreach (Signed)
[facility]) Care Management  05/08/2017  VIDIT BOISSONNEAULT 19-May-1946 [id number]   Care coordination   [facility] CM contacted by Sherlynn Stalls Mr Hurrell's niece to update [facility] CM that Mr Fortenberry has returned to his [place] home from [place] to briefly pack his belongings pending his move back to [place] by the "end of the week" "his sisters will be arriving also on Wednesday to help him" " He has not obtained any doctors up there" "He found a place to stay up there"  [facility] CM discussed with Sherlynn Stalls the availability to see Mr Callanan this week to attempt to assist with warm transfer to providers in [place] as much as possible   [place] L. Lavina Hamman, RN, BSN, [place] Care Management [phone]

## 2017-05-09 ENCOUNTER — Ambulatory Visit (HOSPITAL_COMMUNITY): Payer: Medicare Other | Admitting: Physical Therapy

## 2017-05-09 DIAGNOSIS — M79671 Pain in right foot: Secondary | ICD-10-CM | POA: Diagnosis not present

## 2017-05-09 DIAGNOSIS — E08621 Diabetes mellitus due to underlying condition with foot ulcer: Secondary | ICD-10-CM | POA: Diagnosis not present

## 2017-05-09 DIAGNOSIS — L97513 Non-pressure chronic ulcer of other part of right foot with necrosis of muscle: Secondary | ICD-10-CM | POA: Diagnosis not present

## 2017-05-09 DIAGNOSIS — R600 Localized edema: Secondary | ICD-10-CM

## 2017-05-09 DIAGNOSIS — R262 Difficulty in walking, not elsewhere classified: Secondary | ICD-10-CM

## 2017-05-09 NOTE — Therapy (Signed)
[place], [name], [address] Phone: [phone]   Fax:  [phone]  Wound Care Therapy  Patient Details  Name: [name] MRN: [id number] Date of Birth: [birth year] Referring Provider: Nicholes Mango  Encounter Date: 05/09/2017      PT End of Session - 05/09/17 0902    Visit Number 11   Number of Visits 16   Date for PT Re-Evaluation 05/26/17   Authorization Type [employer] medicare, cert 6/23-76/28   Authorization - Visit Number 11   Authorization - Number of Visits 20   PT Start Time 0815   PT Stop Time 0850   PT Time Calculation (min) 35 min   Activity Tolerance Patient tolerated treatment well;No increased pain   Behavior During Therapy WFL for tasks assessed/performed      Past Medical History:  Diagnosis Date  . At risk for sudden cardiac death 2013/06/10  . Cataract   . Chronic anticoagulation 05/2013   started  . Chronic kidney disease    Patient reports he is seeing Kidney doctor in September  . Coronary artery disease   . Diabetes mellitus    type 2   . Dyslipidemia   . GERD (gastroesophageal reflux disease)   . History of valve replacement 2010   [facility] Ease bioprosthetic 55mm  . Hypertension   . PAF (paroxysmal atrial fibrillation) ([place]) 05/2013   converted with amiodarone to SR  . S/P CABG x 3 2010  . S/P cardiac catheterization, Rt & Lt heart cath 06/05/2013 Jun 10, 2013  . Stroke [facility]) 2003   R-sided weakness & upper extremity swelling   . Thyroid disease     Past Surgical History:  Procedure Laterality Date  . AORTIC VALVE REPLACEMENT  01/26/2009   [facility] Ease bioprosthetic 57mm valve  . CARDIAC CATHETERIZATION  06/05/2013   native 3 vessel disease; patent LIMA to LAD, SVG to OM and SVG to PDA; Elevated right and left heart filling pressures, with predominantly pulmonary venous hypertension, normal PVR  . COLONOSCOPY  2007   Dr. Aviva Signs: internal  hemorrhoids  . COLONOSCOPY N/A 06/16/2016   Procedure: COLONOSCOPY;  Surgeon: Rogene Houston, MD;  Location: [facility] ENDO SUITE;  Service: Endoscopy;  Laterality: N/A;  1:45  . CORONARY ARTERY BYPASS GRAFT  01/26/2009   LIMA to LAD, SVG to circumflex, SVG to PDA  . GRAFT(S) ANGIOGRAM  06/05/2013   Procedure: GRAFT(S) Cyril Loosen;  Surgeon: Leonie Man, MD;  Location: [facility] CATH LAB;  Service: Cardiovascular;;  . IR ANGIOGRAM EXTREMITY LEFT  02/10/2017  . IR ANGIOGRAM EXTREMITY LEFT  02/27/2017  . IR FEM POP ART ATHERECT INC PTA MOD SED  02/10/2017  . IR GENERIC HISTORICAL  05/12/2016   IR RADIOLOGIST EVAL & MGMT 05/12/2016 Jacqulynn Cadet, MD [facility] INTERV RAD  . IR RADIOLOGIST EVAL & MGMT  01/04/2017  . IR US GUIDE VASC ACCESS LEFT  02/27/2017  . IR US GUIDE VASC ACCESS LEFT  02/27/2017  . IR US GUIDE VASC ACCESS RIGHT  02/10/2017  . LEFT AND RIGHT HEART CATHETERIZATION WITH CORONARY ANGIOGRAM N/A 06/05/2013   Procedure: LEFT AND RIGHT HEART CATHETERIZATION WITH CORONARY ANGIOGRAM;  Surgeon: Leonie Man, MD;  Location: [facility] CATH LAB;  Service: Cardiovascular;  Laterality: N/A;  . TRANSTHORACIC ECHOCARDIOGRAM  09/2011   grade 1 diastolic dysfunction; increasing valve gradient; calcified MV annulus   . TRANSTHORACIC ECHOCARDIOGRAM  06/03/2013   EF 25-30%, mod conc. hypertrophy, grade 3 diastolic dysfunction; LA  mod dilated; calcified MV annulus; transaortic gradients are normal for the bioprosthetic valve; inf vena cava dilated (elevated CVP) - LifeVest    There were no vitals filed for this visit.                  Wound Therapy - 05/09/17 0855    Subjective Pt states he is tired from moving.  States this will be his last week in [place] and will be moving to [place] this weekend.   Patient and Family Stated Goals wound to heal    Date of Onset 01/13/17   Prior Treatments self care    Pain Assessment No/denies pain   Evaluation and Treatment Procedures Explained to Patient/Family Yes    Evaluation and Treatment Procedures agreed to   Wound Properties Date First Assessed: 04/03/17 Time First Assessed: 0814 Wound Type: Non-pressure wound Location Orientation: Right Wound Description (Comments): lateral aspect of right metatarsal  Present on Admission: Yes   Dressing Type Gauze (Comment)   Dressing Changed Changed   Dressing Status Old drainage   Dressing Change Frequency Monday, Wednesday, Friday  for 2 weeks thane 2x a week for 6 weeks.    Site / Wound Assessment Yellow   % Wound base Red or Granulating 10%   % Wound base Yellow/Fibrinous Exudate 90%   Peri-wound Assessment Intact;Erythema (blanchable)  Now entire little toe is affected.   Drainage Amount Minimal   Drainage Description Sanguineous;No odor   Treatment Cleansed;Debridement (Selective)   Wound Properties Date First Assessed: 04/27/17 Time First Assessed: 0915 Wound Type: Other (Comment) Location: Toe (Comment  which one) Location Orientation: Right Wound Description (Comments): Rt 5th toe at toenail   Dressing Type Silver hydrofiber   Dressing Changed Changed   Dressing Status Clean;Dry   Dressing Change Frequency PRN   Site / Wound Assessment Clean;Dry   % Wound base Red or Granulating 90%   % Wound base Yellow/Fibrinous Exudate 10%   Peri-wound Assessment Intact   Margins Attached edges (approximated)   Drainage Amount Minimal   Drainage Description Serosanguineous   Treatment Cleansed;Debridement (Selective)   Wound Properties Date First Assessed: 04/27/17 Time First Assessed: 0915 Wound Type: Other (Comment) Location: Toe (Comment  which one) Location Orientation: Right Wound Description (Comments): great toe Present on Admission: No   Dressing Type Gauze (Comment)  medihoney gel   Dressing Changed Changed   Dressing Status Clean;Dry;Intact   Dressing Change Frequency PRN   Site / Wound Assessment Clean;Dry   % Wound base Red or Granulating 80%   % Wound base Yellow/Fibrinous Exudate 20%   %  Wound base Other/Granulation Tissue (Comment) --  callous 80%   Peri-wound Assessment Intact   Margins Unattached edges (unapproximated)   Drainage Amount Minimal   Drainage Description Serosanguineous   Treatment Cleansed;Debridement (Selective)   Wound Properties Date First Assessed: 05/04/17 Time First Assessed: 0930 Wound Type: Diabetic ulcer Location: Toe (Comment  which one) Location Orientation: Right Wound Description (Comments): medial border of 5th toe Present on Admission: No   Dressing Type Silver hydrofiber   Dressing Changed Changed   Dressing Status Clean;Dry;Intact   Dressing Change Frequency PRN   Site / Wound Assessment Red;Pink   % Wound base Red or Granulating 5%   % Wound base Yellow/Fibrinous Exudate 20%   % Wound base Black/Eschar 80%   Peri-wound Assessment Pink   Drainage Amount Minimal   Drainage Description Serous   Treatment Cleansed;Debridement (Selective)   Selective Debridement - Location Rt lateral foot,  5th MTP;and Little toe    Selective Debridement - Tools Used Forceps;Scissors   Selective Debridement - Tissue Removed dead skin, slough and eschar    Wound Therapy - Clinical Statement Pt returns after 5 days.  Drainage soaked through on lateral wound but wtihout odor.  Cleansed all sites well.  Noted increased black eschar medial 5th toe, increased granulation  at toenail base of 5th toe and great toe.  Increased slough present lateral foot.  Continued with honey on lateral foot and 5th toe and silver hydro on great toe.  Medipore to secure.     Wound Therapy - Functional Problem List difficulty walking    Factors Delaying/Impairing Wound Healing Altered sensation;Diabetes Mellitus;Infection - systemic/local;Immobility;Multiple medical problems;Polypharmacy;Vascular compromise   Hydrotherapy Plan Debridement;Dressing change;Patient/family education;Pulsatile lavage with suction   Wound Therapy - Frequency --  3x/week for 2 weeks then reduce to 2x/week for  6 weeks   Wound Therapy - Current Recommendations PT   Wound Plan See pt 3x a week.  May be discharged this week as patient is to be moving.   Dressing  5th MTP: silverhydrofiber; 2X2 and medipore   Dressing 5th toe: as above                   PT Short Term Goals - 04/07/17 1123      PT SHORT TERM GOAL #1   Title Pt wound on his right foot to be 80% granulated to decrease risk of infection   Time 3   Period Weeks   Status On-going     PT SHORT TERM GOAL #2   Title Pt wound depth on his right foot  to be no greater than one centimeter to show signs of healing    Time 3   Period Weeks   Status On-going     PT SHORT TERM GOAL #3   Title Pt undermining in his Rt foot wound  to be no greater than on centimeter to show signs of healing.l    Time 3   Period Weeks   Status On-going           PT Long Term Goals - 04/07/17 1123      PT LONG TERM GOAL #1   Title PT right foot wound to be 100% granulated to decrease risk of infection.    Time 6   Period Weeks   Status On-going     PT LONG TERM GOAL #2   Title Pt wound on his Right foot depth to be no greater than .2 cm to decrease risk of infection.    Time 6   Period Weeks   Status On-going     PT LONG TERM GOAL #3   Title Pt wound to no longer be undermining.    Time 8   Period Weeks   Status On-going     PT LONG TERM GOAL #4   Title PT wound size to be no greater than .3x .5 and have no depth to allow pt to care for wound at home.    Time 8   Period Weeks   Status On-going             Patient will benefit from skilled therapeutic intervention in order to improve the following deficits and impairments:     Visit Diagnosis: Difficulty in walking, not elsewhere classified  Localized edema  Pain in right foot  Diabetic ulcer of right foot associated with diabetes mellitus due to underlying condition, with  necrosis of muscle, unspecified part of foot [facility])     Problem List Patient Active  Problem List   Diagnosis Date Noted  . Osteomyelitis due to type 2 diabetes mellitus ([place])   . Diabetic foot infection ([place]) 02/07/2017  . Type 2 diabetes mellitus with left diabetic foot infection ([name]) 02/07/2017  . Uncontrolled type 2 diabetes mellitus with hyperglycemia, with long-term current use of insulin ([place]) 02/07/2017  . Cellulitis of left foot 12/12/2016  . Nausea & vomiting 02/24/2016  . A-fib ([place]) 02/23/2016  . Hypothyroidism 02/23/2016  . CKD (chronic kidney disease), stage III 02/23/2016  . Anxiety 02/23/2016  . Chronic combined systolic (congestive) and diastolic (congestive) heart failure ([place]) 02/23/2016  . Dizziness 02/23/2016  . Open wound of right foot 02/23/2016  . Rectal bleeding 07/28/2015  . Chronic anticoagulation 07/28/2015  . PVD (peripheral vascular disease) ([place])   . S/P angioplasty 04/23/2015  . Peripheral arterial occlusive disease ([place]) 04/23/2015  . PAD (peripheral artery disease) ([place])   . Critical lower limb ischemia   . Toe ulcer, right ([place])   . Type II or unspecified type diabetes mellitus without mention of complication, uncontrolled 11/27/2013  . Acute respiratory failure with hypoxia ([place]) 11/26/2013  . CAP (community acquired pneumonia) 11/26/2013  . CHF exacerbation ([place]) 11/25/2013  . Cardiomyopathy, ischemic 06/13/2013  . At risk for sudden cardiac death 06/26/13  . S/P cardiac catheterization, Rt & Lt heart cath 06/05/2013 26-Jun-2013  . Acute on chronic combined systolic and diastolic HF (heart failure), NYHA class 3 ([place]) 06/04/2013  . Atrial flutter with rapid ventricular response ([place]) 06/02/2013  . Acute renal failure: Cr up to 1.8 06/02/2013  . Hyperkalemia 06/02/2013    Class: Acute  . LBBB (left bundle branch block) 06/01/2013  . Hypotension- secondary to AF and Diltiazem Rx 06/01/2013  . Obesity (BMI 30-39.9) 06/01/2013  . Acute on chronic diastolic heart failure - due to Afib AVR 06/01/2013  . Atrial fibrillation with  RVR- converted with Amiodarone 05/31/2013  . Stroke- Lt brain 2003 04/08/2013  . Hemiplegia affecting right dominant side ([place]) 04/08/2013  . Cataract 04/08/2013  . GERD (gastroesophageal reflux disease) 04/08/2013  . S/P CABG x 3 - 2010 04/08/2013  . S/P AVR-tissue, 2010 04/08/2013  . DM2 (diabetes mellitus, type 2) ([place]) 04/08/2013  . Dyslipidemia 04/08/2013  . SUBACROMIAL BURSITIS, LEFT 11/05/2009   Teena Irani, PTA/CLT [phone]  Teena Irani 05/09/2017, 9:04 AM  [place], [name], [address] Phone: [phone]   Fax:  [phone]  Name: [name] MRN: [id number] Date of Birth: 10-Jul-1946

## 2017-05-10 ENCOUNTER — Other Ambulatory Visit: Payer: Self-pay | Admitting: *Deleted

## 2017-05-10 NOTE — Patient Outreach (Signed)
[facility]) Care Management   05/10/2017  BRENON ANTOSH 14-Jun-1946 [id number]   Subjective: see below   Objective:   Review of Systems  HENT: Negative.   Eyes: Negative.   Respiratory: Negative.   Cardiovascular: Positive for leg swelling.  Gastrointestinal: Negative.   Genitourinary: Negative.   Musculoskeletal: Positive for joint pain. Negative for back pain, falls, myalgias and neck pain.  Skin: Negative.   Neurological: Positive for weakness. Negative for dizziness, tingling, tremors, sensory change, speech change, focal weakness, seizures, loss of consciousness and headaches.  Endo/Heme/Allergies: Negative.   Psychiatric/Behavioral: Negative.     Physical Exam  Constitutional: He is oriented to person, place, and time. He appears well-developed and well-nourished.  HENT:  Head: Normocephalic and atraumatic.  Eyes: Pupils are equal, round, and reactive to light. Conjunctivae are normal.  Neck: Normal range of motion. Neck supple.  Cardiovascular: Normal rate and regular rhythm.   Respiratory: Effort normal and breath sounds normal.  GI: Soft. Bowel sounds are normal.  Musculoskeletal: He exhibits edema.  Neurological: He is alert and oriented to person, place, and time.  Skin: Skin is warm and dry.  Psychiatric: He has a normal mood and affect. His behavior is normal. Judgment and thought content normal.    Encounter Medications:   Outpatient Encounter Prescriptions as of 05/10/2017  Medication Sig Note  . acetaminophen (TYLENOL) 500 MG tablet Take 1,000 mg by mouth 2 (two) times daily as needed for headache. For pain/headaches   . alprazolam (XANAX) 2 MG tablet Take 1 mg by mouth 3 (three) times daily.    Marland Kitchen amiodarone (PACERONE) 200 MG tablet Take 1 tablet (200 mg total) by mouth daily.   Marland Kitchen amoxicillin-clavulanate (AUGMENTIN) 500-125 MG tablet TAKE ONE TABLET BY MOUTH TWICE A DAY   . apixaban (ELIQUIS) 5 MG TABS tablet Take 1 tablet (5 mg total)  by mouth 2 (two) times daily.   Marland Kitchen atorvastatin (LIPITOR) 20 MG tablet Take 1 tablet (20 mg total) by mouth daily at 6 PM.   . doxycycline (VIBRAMYCIN) 100 MG capsule Take 1 capsule (100 mg total) by mouth 2 (two) times daily.   . fenofibrate (TRICOR) 145 MG tablet Take 1 tablet (145 mg total) by mouth daily.   . ferrous sulfate 325 (65 FE) MG EC tablet Take 325 mg by mouth daily with breakfast. 02/07/2017: Is not taking while currently prescribed antibiotic  . gabapentin (NEURONTIN) 300 MG capsule Take 300 mg by mouth 2 (two) times daily.    . insulin glargine (LANTUS) 100 UNIT/ML injection Inject 0.5 mLs (50 Units total) into the skin 2 (two) times daily. 75 units in the morning then 60 units at bedtime 02/17/2017: Changed to 50 u bid, 75 units in am and 60 units hs  . insulin lispro (HUMALOG) 100 UNIT/ML injection Inject 0.08 mLs (8 Units total) into the skin 3 (three) times daily with meals.   Colbert Ewing 1 g injection    . INVOKANA 100 MG TABS tablet Take 1 tablet by mouth daily.   Marland Kitchen levothyroxine (SYNTHROID, LEVOTHROID) 25 MCG tablet Take 25 mcg by mouth daily before breakfast.   . meclizine (ANTIVERT) 25 MG tablet Take 25 mg by mouth 3 (three) times daily as needed for dizziness.   . metoprolol succinate (TOPROL-XL) 25 MG 24 hr tablet Take 1 tablet (25 mg total) by mouth daily.   . Omega-3 Fatty Acids (FISH OIL) 1200 MG CAPS Take 1 capsule by mouth daily.    Marland Kitchen oxyCODONE-acetaminophen (  PERCOCET/ROXICET) 5-325 MG tablet Take 1 tablet by mouth 2 (two) times daily.   . pantoprazole (PROTONIX) 40 MG tablet Take 1 tablet (40 mg total) by mouth 2 (two) times daily.   . silver sulfADIAZINE (SILVADENE) 1 % cream Apply 1 application topically daily. 03/24/2017: States no longer have silvadene and not used in a while on wound  . sodium chloride 0.9 % infusion    . torsemide (DEMADEX) 10 MG tablet Take 1 tablet (10 mg total) by mouth daily.    No facility-administered encounter medications on file as of  05/10/2017.     Functional Status:   In your present state of health, do you have any difficulty performing the following activities: 03/10/2017 02/27/2017  Hearing? Y N  Vision? N N  Difficulty concentrating or making decisions? Y N  Walking or climbing stairs? Y Y  Dressing or bathing? N N  Doing errands, shopping? Y -  Conservation officer, nature and eating ? N -  Using the Toilet? N -  In the past six months, have you accidently leaked urine? N -  Do you have problems with loss of bowel control? N -  Managing your Medications? N -  Managing your Finances? Y -  Housekeeping or managing your Housekeeping? Y -  Some recent data might be hidden    Fall/Depression Screening:    Fall Risk  03/10/2017 02/06/2017 12/28/2015  Falls in the past year? No No Yes  Comment - - -  Number falls in past yr: - - 2 or more  Comment - - -  Injury with Fall? - - No  Comment - - -  Risk for fall due to : Impaired balance/gait;Impaired mobility;Impaired vision Impaired balance/gait;Impaired mobility;Impaired vision Impaired balance/gait  Risk for fall due to: Comment - - -  Follow up - - Falls prevention discussed   PHQ 2/9 Scores 03/10/2017 02/06/2017 02/06/2017 11/25/2015 11/02/2015 09/30/2015 09/21/2015  PHQ - 2 Score 0 0 0 0 '2 2 2  ' PHQ- 9 Score - - - - '7 7 7    ' Assessment:    Met with Mr Wessell in his home in [name].  He has returned to pack and move items to [facility]. He reports his last day living in this home will be on May 12, 2017, Friday.  He reports he is putting his "stuff in storage in [place].  I have not found an apartment yet and I am going to live with [name], my baby sister or [name] who live in [name] until I find one.  My nephew sold this trailer right from under me."  Confirms he will live in [place]. Billie's address entered in EPIC for his temporary address as [address] [name] [address] Mr Marty told the wound care center he was moving on yesterday, 05/09/17 when he  went to the clinic and reports he is scheduled to return on Thursday 05/11/17 at 0815 for wound care services.  He agreed    Plan:  [facility] CM provided copies of a list of in network pcp, home health agencies, equipment agencies and the contact information for [employer].  THN CM called and left a message at [place] to inform them of Mr Brayboy's change of residence and asked for possible recommendations for medical providers.   THN CM spoke with his sister, eunice, using mr Tukes's cell phone to get assistance with possible pcps She recommends Dr Marny Lowenstein, pcp  and Ammie Dalton, endocrinologist  THN CM discussed wit THN CM spoke with [facility] at [place] office to find out Dr Gerarda Fraction is out of the office and the PA, Cheyenne Adas is covering until he returns.  Informed them of Mr Defina's change of residence and asked for possible recommendations for medical providers. THN CM spoke with Janett Billow at Dr Ammie Dalton office [phone]) She states Dr Concepcion Living is not accepting new patients unless they are referred by a pcp.  Janett Billow recommend that Mr Beitler call [phone] [facility] or Jilda Roche MD.  Mr Lehner and [name] was updated that the new pcp will have to make a referral to Dr Concepcion Living and Dr Concepcion Living is only accepting new patients if referred by a pcp Spoke with Pam at Dr Frederico Hamman MD office ([email or web address] to get an appt with Bernette Mayers for Wednesday May 17 2017 at 0830 but they will need his records by 05/16/17 and faxed to [phone] request he bring his insurance card, medicines in the bottles, medical records, new patient forms completed and picture ID. THN CM wrote this out for Mr Anglin and sisters [facility] CM went Mr Zinni to Dr Gerarda Fraction office and the [place] [facility] (wanda) to assist with filling out a medical release forms and request records be sent to new pcp by 05/16/17 as requested by Pam at the office  [facility]  CM discussed meeting Mr Ambrosini at his wound care appt on 05/11/17 or at his home to assist with completing the new patient package for [facility] ridge MD that needs to be taken on 05/17/17. He agreed and was very thankful for CM assistance (unable to read or write) THN Cm wrote all instructions out and lists of infectious disease, gastroenterologists and cardiologists in network for [employer] for Mr Mcclenahan and his sisters.  Route note to pcp and other care team members Mr Eddinger has met most of his goals except details of CHF which he was in the process of learning He does have his blue [facility] Calendar with the CHF action plan and [facility] CM referred him to this action plan to re review THN CM will close case for   [facility] CM Care Plan Problem One     Most Recent Value  Care Plan Problem One  wound left foot   Role Documenting the Problem One  Care Management Coordinator  Care Plan for Problem One  Active  THN Long Term Goal   over the next 45 days patient will verbalize Knowledge of home care of left foot cellulitis wound  THN Long Term Goal Start Date  03/10/17  [facility] Long Term Goal Met Date  05/10/17  Interventions for Problem One Long Term Goal  assess left foot, teach home care monitor progress  THN CM Short Term Goal #1   Over the next 30 days left foot would will show signs and symptoms of healing   THN CM Short Term Goal #1 Start Date  03/10/17  [facility] CM Short Term Goal #1 Met Date  03/24/17  Interventions for Short Term Goal #1  assess left foot, teach home care monitor progress    [facility] CM Care Plan Problem Two     Most Recent Value  Care Plan Problem Two  Knowledge deficit of home care for chronic medical conditions (CHF, DM, afib, HTN)  Role Documenting the Problem Two  Care Management Coordinator  Care Plan for Problem Two  Active  Interventions for Problem Two Long Term Goal   assess needs, educate on zones of care and home care for chronic medical conditions, evaluate understanding, CHF  scale/weight program  [facility] Long Term Goal  over the next 45 days patient will voice better understanding of home care for chroinc medical conditions  THN Long Term Goal Start Date  03/10/17  [facility] Long Term Goal Met Date  05/10/17  THN CM Short Term Goal #1   over the next 30 days patient will be able to verbalized zones of care, what to reprot to doctor and how to care for CHF, afib, htn, DM  THN CM Short Term Goal #1 Start Date  03/10/17  [facility] CM Short Term Goal #1 Met Date   05/10/17  Interventions for Short Term Goal #2   assess needs, educate on zones of care and home care for chronic medical conditions, evaluate understanding, CHF scale/weight program     Lawernce Earll L. Lavina Hamman, RN, BSN, [place] Care Management [phone]

## 2017-05-11 ENCOUNTER — Telehealth: Payer: Self-pay | Admitting: *Deleted

## 2017-05-11 ENCOUNTER — Ambulatory Visit (HOSPITAL_COMMUNITY): Payer: Medicare Other | Admitting: Physical Therapy

## 2017-05-11 DIAGNOSIS — M79671 Pain in right foot: Secondary | ICD-10-CM

## 2017-05-11 DIAGNOSIS — R600 Localized edema: Secondary | ICD-10-CM

## 2017-05-11 DIAGNOSIS — E08621 Diabetes mellitus due to underlying condition with foot ulcer: Secondary | ICD-10-CM

## 2017-05-11 DIAGNOSIS — R262 Difficulty in walking, not elsewhere classified: Secondary | ICD-10-CM | POA: Diagnosis not present

## 2017-05-11 DIAGNOSIS — L97513 Non-pressure chronic ulcer of other part of right foot with necrosis of muscle: Secondary | ICD-10-CM

## 2017-05-11 NOTE — Patient Outreach (Signed)
[facility]) Care Management  05/11/2017  [name] 11/19/1945 [id number]   Care coordination   [facility] CM met with Rickey [name] at his outpatient wound care appt to provide him with the new patient forms needed for Dr Birdie Riddle next week appt and a list of in network cardiologists, infectious disease and cardiologists in [place] Rickey [name] states he is more familiar with driving in [place] versus [place] Rickey [name] consulted his sister [name] via his cell for recommendations.  Rickey [name] offer moving supplies by the wound care center staff.    Plan [facility] CM called and left a message with [facility] to confirm in network with united healthcare AARP complete plan 2 HMO that pt has and to initiate a referral process- [facility] CM received a return call from [name] at [place] wound care while still at [facility] wound care center with Rickey [name] to confirm "no issues with united health care insurance here. We have only had trouble with some [place] based insurances" THN CM gave fax number [phone] for Estill Bamberg to fax his new patient forms to the [facility] center today [facility] CM updated Rickey [name] and [place] wound care center staff [name] CM called and spoke with [name] at Dr Debara Pickett office to discuss Rickey [name] move to [employer]- she advised going to [employer] [place] CV office at [address] office to complete a medical release form and have them to fax it to Dr Debara Pickett office  [place] spoke with [name] his sister about cardiology recommendation and her recommendation was Dr [name]- She wants to assist Rickey Russom with changing his new pcp appointment from May 17 2017 to a date when she is scheduled to visit pcp office to assist in transportation and directions to the facility.  [facility]N CM recommends not changing the appointment and will not be participating in this change Sister notified Spoke [name] at [facility]- Dr [name] to make a new  patient referral  She reports that preference is for [name] to see Rickey Mayers PA first on May 17 2017 and then have her to make further referrals to Dr [name] and Dr [name]. She discussed not changing the May 17 2017 appointment This was shared with Rickey [name] while on the telephone with [name] and he was strongly encouraged not to change the appointment for convenience of transport with [name] related to the next appointment may take months as indicated by [name]. [facility]N CM met Rickey [name] at the [facility] [address] to complete a release form that was faxed to Dr Debara Pickett office to release his medical records to Dr [name] office Copies of all information given to Rickey [name] about his new pcp (new pt referral sheets), DSS contact, names of his previous CV, infectious disease and GI MDs, CV, endocrinologist and wound care center (new pt referral) was made in case he lost them prior to sharing with his sisters [name] and [name] [facility] CM stopped by Dr [name], pcp office to get a signature on wound care referral but Dr [name] and the covering NP/PA, [name] both are not present per staff -Pending return of provider on next week [facility] CM will get a signature to fax off wound care referral.  Rickey [name] agreed to return to [facility] wound care center once next week prior to wound care transfer.  THN CM noted Rickey [name] saying he would return and then  stating he did not know if he would return from the time of leaving the wound care center to arrival to the heart center   case closure related to [place] covered [place] Routed not to [employer], CV, GI, ID, listed epic providers  Rickey Tworek L. Lavina Hamman, RN, BSN, [place] Care Management [phone]

## 2017-05-11 NOTE — Therapy (Signed)
[place], [name], [address] Phone: [phone]   Fax:  [phone]  Wound Care Therapy  Patient Details  Name: [name] MRN: [id number] Date of Birth: [birth year] Referring Provider: Nicholes Mango  Encounter Date: 05/11/2017      PT End of Session - 05/11/17 1512    Visit Number 12   Number of Visits 16   Date for PT Re-Evaluation 05/26/17   Authorization Type UHC medicare, cert 1/44-81/[id number]   Authorization - Visit Number 12   Authorization - Number of Visits 20   PT Start Time 0816   PT Stop Time 0845   PT Time Calculation (min) 29 min   Activity Tolerance Patient tolerated treatment well;No increased pain   Behavior During Therapy WFL for tasks assessed/performed      Past Medical History:  Diagnosis Date  . At risk for sudden cardiac death 2013/07/01  . Cataract   . Chronic anticoagulation 05/2013   started  . Chronic kidney disease    Patient reports he is seeing Kidney doctor in September  . Coronary artery disease   . Diabetes mellitus    type 2   . Dyslipidemia   . GERD (gastroesophageal reflux disease)   . History of valve replacement 2010   [facility] Ease bioprosthetic 1mm  . Hypertension   . PAF (paroxysmal atrial fibrillation) ([place]) 05/2013   converted with amiodarone to SR  . S/P CABG x 3 2010  . S/P cardiac catheterization, Rt & Lt heart cath 06/05/2013 07-01-13  . Stroke [facility]) 2003   R-sided weakness & upper extremity swelling   . Thyroid disease     Past Surgical History:  Procedure Laterality Date  . AORTIC VALVE REPLACEMENT  01/26/2009   [facility] Ease bioprosthetic 72mm valve  . CARDIAC CATHETERIZATION  06/05/2013   native 3 vessel disease; patent LIMA to LAD, SVG to OM and SVG to PDA; Elevated right and left heart filling pressures, with predominantly pulmonary venous hypertension, normal PVR  . COLONOSCOPY  2007   Dr. Aviva Signs: internal  hemorrhoids  . COLONOSCOPY N/A 06/16/2016   Procedure: COLONOSCOPY;  Surgeon: Rogene Houston, MD;  Location[facility];  Service: Endoscopy;  Laterality: N/A;  1:45  . CORONARY ARTERY BYPASS GRAFT  01/26/2009   LIMA to LAD, SVG to circumflex, SVG to PDA  . GRAFT(S) ANGIOGRAM  06/05/2013   Procedure: GRAFT(S) Cyril Loosen;  Surgeon: Leonie Man, MD;  Location: [facility] CATH LAB;  Service: Cardiovascular;;  . IR ANGIOGRAM EXTREMITY LEFT  02/10/2017  . IR ANGIOGRAM EXTREMITY LEFT  02/27/2017  . IR FEM POP ART ATHERECT INC PTA MOD SED  02/10/2017  . IR GENERIC HISTORICAL  05/12/2016   IR RADIOLOGIST EVAL & MGMT 05/12/2016 Jacqulynn Cadet, MD [facility] INTERV RAD  . IR RADIOLOGIST EVAL & MGMT  01/04/2017  . IR US GUIDE VASC ACCESS LEFT  02/27/2017  . IR US GUIDE VASC ACCESS LEFT  02/27/2017  . IR US GUIDE VASC ACCESS RIGHT  02/10/2017  . LEFT AND RIGHT HEART CATHETERIZATION WITH CORONARY ANGIOGRAM N/A 06/05/2013   Procedure: LEFT AND RIGHT HEART CATHETERIZATION WITH CORONARY ANGIOGRAM;  Surgeon: Leonie Man, MD;  Location: [facility] CATH LAB;  Service: Cardiovascular;  Laterality: N/A;  . TRANSTHORACIC ECHOCARDIOGRAM  09/2011   grade 1 diastolic dysfunction; increasing valve gradient; calcified MV annulus   . TRANSTHORACIC ECHOCARDIOGRAM  06/03/2013   EF 25-30%, mod conc. hypertrophy, grade 3 diastolic dysfunction; LA  mod dilated; calcified MV annulus; transaortic gradients are normal for the bioprosthetic valve; inf vena cava dilated (elevated CVP) - LifeVest    There were no vitals filed for this visit.                  Wound Therapy - 05/11/17 1459    Subjective Pt states he is moving this weekend.  Unsure if he will be back for one more session but will call us and let us know.   Pain Assessment No/denies pain   Evaluation and Treatment Procedures Explained to Patient/Family Yes   Evaluation and Treatment Procedures agreed to   Wound Properties Date First Assessed: 05/04/17 Time First  Assessed: 0930 Wound Type: Diabetic ulcer Location: Toe (Comment  which one) Location Orientation: Right Wound Description (Comments): medial border of 5th toe Present on Admission: No   Dressing Type Silver hydrofiber   Dressing Changed Changed   Dressing Status Clean;Dry;Intact   Dressing Change Frequency PRN   Site / Wound Assessment Red;Pink   % Wound base Red or Granulating 5%   % Wound base Yellow/Fibrinous Exudate 20%   % Wound base Black/Eschar 80%   Peri-wound Assessment Pink   Wound Length (cm) 0.5 cm   Wound Width (cm) 0.5 cm   Wound Depth (cm) 0 cm   Wound Volume (cm^3) 0 cm^3   Wound Surface Area (cm^2) 0.25 cm^2   Drainage Amount Minimal   Drainage Description Serous   Treatment Cleansed;Debridement (Selective)   Wound Properties Date First Assessed: 04/27/17 Time First Assessed: 0915 Wound Type: Other (Comment) Location: Toe (Comment  which one) Location Orientation: Right Wound Description (Comments): Rt 5th toe at toenail   Dressing Type Silver hydrofiber   Dressing Changed Changed   Dressing Status Clean;Dry   Dressing Change Frequency PRN   Site / Wound Assessment Clean;Dry   % Wound base Red or Granulating 100%   % Wound base Yellow/Fibrinous Exudate 0%   Peri-wound Assessment Intact   Wound Length (cm) 0.5 cm   Wound Width (cm) 0.2 cm   Wound Depth (cm) 0 cm   Wound Volume (cm^3) 0 cm^3   Wound Surface Area (cm^2) 0.1 cm^2   Margins Attached edges (approximated)   Drainage Amount None   Treatment Cleansed;Debridement (Selective)   Wound Properties Date First Assessed: 04/27/17 Time First Assessed: 0915 Wound Type: Other (Comment) Location: Toe (Comment  which one) Location Orientation: Right Wound Description (Comments): great toe Present on Admission: No   Dressing Type Gauze (Comment)   Dressing Changed Changed   Dressing Status Clean;Dry;Intact   Dressing Change Frequency PRN   Site / Wound Assessment Clean;Dry   % Wound base Red or Granulating 90%    % Wound base Yellow/Fibrinous Exudate 10%   Wound Length (cm) 0.7 cm   Wound Width (cm) 0.6 cm   Wound Depth (cm) 0.1 cm   Wound Volume (cm^3) 0.04 cm^3   Wound Surface Area (cm^2) 0.42 cm^2   Margins Unattached edges (unapproximated)   Drainage Amount Minimal   Drainage Description Serosanguineous   Treatment Cleansed;Debridement (Selective)   Wound Properties Date First Assessed: 04/03/17 Time First Assessed: 0814 Wound Type: Non-pressure wound Location Orientation: Right Wound Description (Comments): lateral aspect of right metatarsal  Present on Admission: Yes   Dressing Type Gauze (Comment)   Dressing Changed Changed   Dressing Status Old drainage   Dressing Change Frequency Monday, Wednesday, Friday   Site / Wound Assessment Yellow   % Wound base Red or  Granulating 10%   % Wound base Yellow/Fibrinous Exudate 90%   Peri-wound Assessment Intact;Erythema (blanchable)   Wound Length (cm) 3.5 cm   Wound Width (cm) 3.2 cm   Wound Depth (cm) 0.5 cm   Wound Volume (cm^3) 5.6 cm^3   Wound Surface Area (cm^2) 11.2 cm^2   Drainage Amount Minimal   Drainage Description Sanguineous;No odor   Treatment Cleansed;Debridement (Selective)   Selective Debridement - Location Rt lateral foot, 5th MTP;and Little toe    Selective Debridement - Tools Used Forceps;Scissors   Selective Debridement - Tissue Removed dead skin, slough and eschar    Wound Therapy - Clinical Statement Pt with possible last treatment this session, however will come to next appt if still in town.  All wounds remeasured this session with all showing progression either by increased granulation or reduction in size.  wound at toenail is 95% healed at this point.  continued with irrigation and debridment to all areas.  Medihoney gel used medial 5th toe and latera foot.  silver hydrofiber used on great toe as will continued to steadily drain.    Wound Therapy - Functional Problem List difficulty walking    Factors Delaying/Impairing  Wound Healing Altered sensation;Diabetes Mellitus;Infection - systemic/local;Immobility;Multiple medical problems;Polypharmacy;Vascular compromise   Hydrotherapy Plan Debridement;Dressing change;Patient/family education;Pulsatile lavage with suction   Wound Plan continue woundcare X 1 more session.  [place]                   PT Short Term Goals - 04/07/17 1123      PT SHORT TERM GOAL #1   Title Pt wound on his right foot to be 80% granulated to decrease risk of infection   Time 3   Period Weeks   Status On-going     PT SHORT TERM GOAL #2   Title Pt wound depth on his right foot  to be no greater than one centimeter to show signs of healing    Time 3   Period Weeks   Status On-going     PT SHORT TERM GOAL #3   Title Pt undermining in his Rt foot wound  to be no greater than on centimeter to show signs of healing.l    Time 3   Period Weeks   Status On-going           PT Long Term Goals - 04/07/17 1123      PT LONG TERM GOAL #1   Title PT right foot wound to be 100% granulated to decrease risk of infection.    Time 6   Period Weeks   Status On-going     PT LONG TERM GOAL #2   Title Pt wound on his Right foot depth to be no greater than .2 cm to decrease risk of infection.    Time 6   Period Weeks   Status On-going     PT LONG TERM GOAL #3   Title Pt wound to no longer be undermining.    Time 8   Period Weeks   Status On-going     PT LONG TERM GOAL #4   Title PT wound size to be no greater than .3x .5 and have no depth to allow pt to care for wound at home.    Time 8   Period Weeks   Status On-going             Patient will benefit from skilled therapeutic  intervention in order to improve the following deficits and impairments:     Visit Diagnosis: Difficulty in walking, not elsewhere classified  Localized edema  Pain in right foot  Diabetic ulcer of right foot associated with  diabetes mellitus due to underlying condition, with necrosis of muscle, unspecified part of foot [facility])     Problem List Patient Active Problem List   Diagnosis Date Noted  . Osteomyelitis due to type 2 diabetes mellitus ([place])   . Diabetic foot infection ([place]) 02/07/2017  . Type 2 diabetes mellitus with left diabetic foot infection ([place]) 02/07/2017  . Uncontrolled type 2 diabetes mellitus with hyperglycemia, with long-term current use of insulin ([place]) 02/07/2017  . Cellulitis of left foot 12/12/2016  . Nausea & vomiting 02/24/2016  . A-fib ([place]) 02/23/2016  . Hypothyroidism 02/23/2016  . CKD (chronic kidney disease), stage III 02/23/2016  . Anxiety 02/23/2016  . Chronic combined systolic (congestive) and diastolic (congestive) heart failure ([place]) 02/23/2016  . Dizziness 02/23/2016  . Open wound of right foot 02/23/2016  . Rectal bleeding 07/28/2015  . Chronic anticoagulation 07/28/2015  . PVD (peripheral vascular disease) ([place])   . S/P angioplasty 04/23/2015  . Peripheral arterial occlusive disease ([place]) 04/23/2015  . PAD (peripheral artery disease) ([place])   . Critical lower limb ischemia   . Toe ulcer, right ([place])   . Type II or unspecified type diabetes mellitus without mention of complication, uncontrolled 11/27/2013  . Acute respiratory failure with hypoxia ([place]) 11/26/2013  . CAP (community acquired pneumonia) 11/26/2013  . CHF exacerbation ([place]) 11/25/2013  . Cardiomyopathy, ischemic 06/13/2013  . At risk for sudden cardiac death 25-Jun-2013  . S/P cardiac catheterization, Rt & Lt heart cath 06/05/2013 June 25, 2013  . Acute on chronic combined systolic and diastolic HF (heart failure), NYHA class 3 ([place]) 06/04/2013  . Atrial flutter with rapid ventricular response ([place]) 06/02/2013  . Acute renal failure: Cr up to 1.8 06/02/2013  . Hyperkalemia 06/02/2013    Class: Acute  . LBBB (left bundle branch block) 06/01/2013  . Hypotension- secondary to AF and Diltiazem Rx 06/01/2013   . Obesity (BMI 30-39.9) 06/01/2013  . Acute on chronic diastolic heart failure - due to Afib AVR 06/01/2013  . Atrial fibrillation with RVR- converted with Amiodarone 05/31/2013  . Stroke- Lt brain 2003 04/08/2013  . Hemiplegia affecting right dominant side ([place]) 04/08/2013  . Cataract 04/08/2013  . GERD (gastroesophageal reflux disease) 04/08/2013  . S/P CABG x 3 - 2010 04/08/2013  . S/P AVR-tissue, 2010 04/08/2013  . DM2 (diabetes mellitus, type 2) ([place]) 04/08/2013  . Dyslipidemia 04/08/2013  . SUBACROMIAL BURSITIS, LEFT 11/05/2009   Teena Irani, PTA/CLT [phone]  Teena Irani 05/11/2017, 3:13 PM  [place], [name], [address] Phone: [phone]   Fax:  [phone]  Name: REGNALD BOWENS MRN: [id number] Date of Birth: 12/17/1945

## 2017-05-16 ENCOUNTER — Other Ambulatory Visit: Payer: Self-pay | Admitting: *Deleted

## 2017-05-16 ENCOUNTER — Telehealth (HOSPITAL_COMMUNITY): Payer: Self-pay | Admitting: Internal Medicine

## 2017-05-16 ENCOUNTER — Ambulatory Visit (HOSPITAL_COMMUNITY): Payer: Medicare Other | Attending: Internal Medicine | Admitting: Physical Therapy

## 2017-05-16 DIAGNOSIS — L97425 Non-pressure chronic ulcer of left heel and midfoot with muscle involvement without evidence of necrosis: Secondary | ICD-10-CM | POA: Diagnosis not present

## 2017-05-16 DIAGNOSIS — M79671 Pain in right foot: Secondary | ICD-10-CM | POA: Diagnosis not present

## 2017-05-16 DIAGNOSIS — R600 Localized edema: Secondary | ICD-10-CM | POA: Diagnosis not present

## 2017-05-16 DIAGNOSIS — M79672 Pain in left foot: Secondary | ICD-10-CM | POA: Diagnosis not present

## 2017-05-16 DIAGNOSIS — R262 Difficulty in walking, not elsewhere classified: Secondary | ICD-10-CM | POA: Insufficient documentation

## 2017-05-16 DIAGNOSIS — E08621 Diabetes mellitus due to underlying condition with foot ulcer: Secondary | ICD-10-CM | POA: Diagnosis not present

## 2017-05-16 DIAGNOSIS — L97513 Non-pressure chronic ulcer of other part of right foot with necrosis of muscle: Secondary | ICD-10-CM | POA: Insufficient documentation

## 2017-05-16 NOTE — Telephone Encounter (Signed)
05/16/17  Patient is moving to [employer]

## 2017-05-16 NOTE — Patient Outreach (Signed)
[facility]) Care Management  05/16/2017  LENWOOD BALSAM 25-May-1946 [id number]   Care coordination  Stopped by [facility] to get wound care referral form signed  Dr Gerarda Fraction and Legrand Com[place], [place] are not present  Aldona Bar NP present and signed forms, medication list, [place] wound care recent treatment notes and his last discharge summary to provide past medical history for Mr [facility] CM [facility]ed [facility] to [facility] and left a voice message requesting a return call if further information is needed    Plan case closure Pt has moved from [employer] to [employer] effective 05/12/17   For closure reason Does not meet program criteria and Consumer has withdrawn from program  [place] updated via in basket   [place]. Lavina Hamman, RN, BSN, [place] Care Management [phone]

## 2017-05-16 NOTE — Therapy (Signed)
[place], [name], [address] Phone: [phone]   Fax:  [phone]  Wound Care Therapy  Patient Details  Name: [name] MRN: [id number] Date of Birth: 06/16/1946 Referring Provider: Nicholes Mango  Encounter Date: 05/16/2017      PT End of Session - 05/16/17 1244    Visit Number 13   Number of Visits 13   Date for PT Re-Evaluation 05/26/17   Authorization Type [employer] medicare, cert [id number]   Authorization - Visit Number 13   Authorization - Number of Visits 13   PT Start Time 0815   PT Stop Time 0349   PT Time Calculation (min) 40 min   Activity Tolerance Patient tolerated treatment well;No increased pain   Behavior During Therapy WFL for tasks assessed/performed      Past Medical History:  Diagnosis Date  . At risk for sudden cardiac death June 30, 2013  . Cataract   . Chronic anticoagulation 05/2013   started  . Chronic kidney disease    Patient reports he is seeing Kidney doctor in September  . Coronary artery disease   . Diabetes mellitus    type 2   . Dyslipidemia   . GERD (gastroesophageal reflux disease)   . History of valve replacement 2010   [facility] Ease bioprosthetic 92m  . Hypertension   . PA[facility] ([facility]oxysmal atrial fibrillation) ([facility] 05/2013   converted with amiodarone to SR  . S/P CABG x 3 2010  . S/P cardiac catheterization, Rt & Lt heart cath 06/05/2013 12014-11-16 . Stroke ([facility] 2003   R-sided weakness & upper extremity swelling   . Thyroid disease     Past Surgical History:  Procedure Laterality Date  . [facility]  [facility]  . CARDIAC CATHETERIZATION  06/05/2013   native 3 vessel disease; patent LIMA to LAD, SVG to OM and SVG to PDA; Elevated right and left heart filling pressures, with predominantly pulmonary venous hypertension, normal PVR  . COLONOSCOPY  2007   Dr. MaAviva Signsinternal  hemorrhoids  . COLONOSCOPY N/A 06/16/2016   Procedure: COLONOSCOPY;  Surgeon: NaRogene HoustonMD;  Location: [facility] ENDO SUITE;  Service: Endoscopy;  Laterality: N/A;  1:45  . CORONARY ARTERY BYPASS GRAFT  01/26/2009   LIMA to LAD, SVG to circumflex, SVG to PDA  . GRAFT(S) ANGIOGRAM  06/05/2013   Procedure: GRAFT(S) ANCyril Loosen Surgeon: DaLeonie ManMD;  [facility];  Service: Cardiovascular;;  . IR ANGIOGRAM EXTREMITY LEFT  02/10/2017  . IR ANGIOGRAM EXTREMITY LEFT  02/27/2017  . IR FEM POP ART ATHERECT INC PTA MOD SED  02/10/2017  . IR GENERIC HISTORICAL  05/12/2016   IR RADIOLOGIST EVAL & MGMT 05/12/2016 HeJacqulynn CadetMD [facility] INTERV RAD  . IR RADIOLOGIST EVAL & MGMT  01/04/2017  . IR [place] VASC ACCESS LEFT  02/27/2017  . IR [place] VASC ACCESS LEFT  02/27/2017  . IR [place] VASC ACCESS RIGHT  02/10/2017  . LEFT AND RIGHT HEART CATHETERIZATION WITH CORONARY ANGIOGRAM N/A 06/05/2013   Procedure: LEFT AND RIGHT HEART CATHETERIZATION WITH CORONARY ANGIOGRAM;  Surgeon: DaLeonie ManMD;  [facility];  Service: Cardiovascular;  Laterality: N/A;  . TRANSTHORACIC ECHOCARDIOGRAM  09/2011   grade 1 diastolic dysfunction; increasing valve gradient; calcified MV annulus   . TRANSTHORACIC ECHOCARDIOGRAM  06/03/2013   EF 25-30%, mod conc. hypertrophy, grade 3 diastolic dysfunction; LA  mod dilated; calcified MV annulus; transaortic gradients are normal for the bioprosthetic valve; inf vena cava dilated (elevated CVP) - LifeVest    There were no vitals filed for this visit.                  Wound Therapy - 05/16/17 1234    Subjective Pt states that he is moving today.  Pt ask therapist to look at his toe on his left foot as it has been hurting.     Pain Assessment 0-10   Pain Score 6    Pain Type Acute pain   Pain Location Toe (Comment which one)   Pain Orientation Left  4th   Pain Descriptors / Indicators Burning   Pain Onset Gradual   Patients Stated Pain  Goal 0   Pain Intervention(s) Emotional support   Evaluation and Treatment Procedures Explained to Patient/Family Yes   Evaluation and Treatment Procedures agreed to   Wound Properties Date First Assessed: 05/04/17 Time First Assessed: 0930 Wound Type: Diabetic ulcer Location: Toe (Comment  which one) Location Orientation: Right Wound Description (Comments): medial border of 5th toe Present on Admission: No   Dressing Type Silver hydrofiber   Dressing Status Intact;Old drainage   Dressing Change Frequency PRN   Site / Wound Assessment Red;Pink   % Wound base Red or Granulating 0%   % Wound base Yellow/Fibrinous Exudate 100%   Peri-wound Assessment Maceration   Wound Length (cm) 0.7 cm   Wound Width (cm) 0.7 cm   Wound Surface Area (cm^2) 0.49 cm^2   Drainage Amount Moderate   Drainage Description Serous;Odor   Wound Properties Date First Assessed: 04/27/17 Time First Assessed: 0915 Wound Type: Other (Comment) Location: Toe (Comment  which one) Location Orientation: Right Wound Description (Comments): Rt 5th toe at toenail   Dressing Type Silver hydrofiber   Dressing Changed Changed   Dressing Status Dry;Old drainage   Dressing Change Frequency PRN   Site / Wound Assessment Clean;Dry   % Wound base Red or Granulating 100%   % Wound base Yellow/Fibrinous Exudate 0%   Peri-wound Assessment Intact   Margins Attached edges (approximated)   Drainage Amount None   Wound Properties Date First Assessed: 04/27/17 Time First Assessed: 0915 Wound Type: Other (Comment) Location: Toe (Comment  which one) Location Orientation: Right Wound Description (Comments): great toe Present on Admission: No   Dressing Type Gauze (Comment)   Dressing Changed Changed   Dressing Status Intact;Old drainage   Dressing Change Frequency PRN   Site / Wound Assessment Clean;Dry   % Wound base Red or Granulating 95%   % Wound base Yellow/Fibrinous Exudate 5%   Wound Length (cm) 0.7 cm   Wound Width (cm) 0.6 cm    Wound Depth (cm) 0.1 cm   Wound Volume (cm^3) 0.04 cm^3   Wound Surface Area (cm^2) 0.42 cm^2   Margins Unattached edges (unapproximated)   Drainage Amount Minimal   Drainage Description Serous   Treatment Cleansed;Debridement (Selective)   Wound Properties Date First Assessed: 04/03/17 Time First Assessed: 0814 Wound Type: Non-pressure wound Location Orientation: Right Wound Description (Comments): lateral aspect of right metatarsal  Present on Admission: Yes   Dressing Type Gauze (Comment);Silver dressings   Dressing Changed Changed   Dressing Status Old drainage   Dressing Change Frequency Monday, Wednesday, Friday   Site / Wound Assessment Yellow   % Wound base Red or Granulating 0%   % Wound base Yellow/Fibrinous Exudate 100%   Peri-wound Assessment Intact;Erythema (blanchable);Maceration  Wound Length (cm) 4 cm   Wound Width (cm) 4 cm   Wound Depth (cm) --  unable to determine   Wound Surface Area (cm^2) 16 cm^2   Drainage Amount Moderate   Drainage Description Serous;Odor   Wound Properties Date First Assessed: 05/16/17 Time First Assessed: 0840 Wound Type: Diabetic ulcer Location: Toe (Comment  which one) Location Orientation: Left , 4  Wound Description (Comments): plantar aspect; .7cm diameter red and swollen no orders to treat   Dressing Type Gauze (Comment)   Dressing Changed Changed   Dressing Status Old drainage   Dressing Change Frequency PRN   Site / Wound Assessment Red   % Wound base Red or Granulating 0%   % Wound base Yellow/Fibrinous Exudate 100%   Wound Length (cm) 1 cm   Wound Width (cm) 1 cm   Wound Depth (cm) --  unknown mushy to the feel    Wound Surface Area (cm^2) 1 cm^2   Treatment Cleansed   Selective Debridement - Location All wounds    Selective Debridement - Tools Used Forceps;Scissors   Selective Debridement - Tissue Removed dead skin, slough and eschar    Wound Therapy - Clinical Statement Pt comes to department stating that he is moving  today.  [place] is [place] up [place]  Pt also states that he has a new wound on his left toe and would like the therapist to look at it for him.  The pt states that [place].  After assessing all wounds the therapist stressed the importance of getting a MD to look at his feet this week.  Therapist recommended the patient to go to the ER once [place] gets [place] the [place] pt verbalized understanding.  Pt wounds are not improving and therapist feels pt may need IV antibiotics.     Wound Therapy - Functional Problem List difficulty walking    Factors Delaying/Impairing Wound Healing Altered sensation;Diabetes Mellitus;Infection - systemic/local;Immobility;Multiple medical problems;Polypharmacy;Vascular compromise   Hydrotherapy Plan Debridement;Dressing change;Patient/family education;Pulsatile lavage with suction   Wound Plan continue woundcare X 1 more session.  Pt will then be [place].                   PT Short Term Goals - 04/07/17 1123      PT SHORT TERM GOAL #1   Title Pt wound on his right foot to be 80% granulated to decrease risk of infection   Time 3   Period Weeks   Status On-going     PT SHORT TERM GOAL #2   Title Pt wound depth on his right foot  to be no greater than one centimeter to show signs of healing    Time 3   Period Weeks   Status On-going     PT SHORT TERM GOAL #3   Title Pt undermining in his Rt foot wound  to be no greater than on centimeter to show signs of healing.l    Time 3   Period Weeks   Status On-going           PT Long Term Goals - 04/07/17 1123      PT LONG TERM GOAL #1   Title PT right foot wound to be 100% granulated to decrease risk of infection.    Time 6   Period Weeks   Status On-going  PT LONG TERM GOAL #2   Title Pt wound on his Right foot depth to be no greater than .2 cm to decrease  risk of infection.    Time 6   Period Weeks   Status On-going     PT LONG TERM GOAL #3   Title Pt wound to no longer be undermining.    Time 8   Period Weeks   Status On-going     PT LONG TERM GOAL #4   Title PT wound size to be no greater than .3x .5 and have no depth to allow pt to care for wound at home.    Time 8   Period Weeks   Status On-going               Plan - May 28, 2017 1244    Clinical Impression Statement as above   Rehab Potential Fair   PT Frequency 3x / week  3 x a weeek for 2 weeks followed by 2x a week for 6 weeks    PT Duration 8 weeks   PT Treatment/Interventions ADLs/Self Care Home Management;Other (comment)  debridement and dressing change.    PT Next Visit Plan Pt will be discharged due to moving out of town.  Therapist urged pt to get a MD to look at his feet.    Consulted and Agree with Plan of Care Patient      Patient will benefit from skilled therapeutic intervention in order to improve the following deficits and impairments:  Abnormal gait, Decreased activity tolerance, Decreased balance, Other (comment), Pain, Difficulty walking (nonhealing wound.)  Visit Diagnosis: Difficulty in walking, not elsewhere classified  Localized edema  Pain in right foot  Diabetic ulcer of right foot associated with diabetes mellitus due to underlying condition, with necrosis of muscle, unspecified part of foot ([place])  Diabetic ulcer of left midfoot associated with diabetes mellitus due to underlying condition, with muscle involvement without evidence of necrosis (H[facility])  Pain in left foot      G-Codes - 28-May-2017 1246    Functional Limitation Other PT primary   Other PT Primary Goal Status (E1740) At least 20 percent but less than 40 percent impaired, limited or restricted   Other PT Primary Discharge Status (C1448) At least 80 percent but less than 100 percent impaired, limited or restricted       Problem List Patient Active Problem List    Diagnosis Date Noted  . Osteomyelitis due to type 2 diabetes mellitus ([place])   . Diabetic foot infection ([place]) 02/07/2017  . Type 2 diabetes mellitus with left diabetic foot infection ([place]) 02/07/2017  . Uncontrolled type 2 diabetes mellitus with hyperglycemia, with long-term current use of insulin ([place]) 02/07/2017  . Cellulitis of left foot 12/12/2016  . Nausea & vomiting 02/24/2016  . A-fib ([place]) 02/23/2016  . Hypothyroidism 02/23/2016  . CKD (chronic kidney disease), stage III ([place]) 02/23/2016  . Anxiety 02/23/2016  . Chronic combined systolic (congestive) and diastolic (congestive) heart failure ([place]) 02/23/2016  . Dizziness 02/23/2016  . Open wound of right foot 02/23/2016  . Rectal bleeding 07/28/2015  . Chronic anticoagulation 07/28/2015  . PVD (peripheral vascular disease) ([place])   . S/P angioplasty 04/23/2015  . Peripheral arterial occlusive disease ([place]) 04/23/2015  . PAD (peripheral artery disease) ([place])   . Critical lower limb ischemia   . Toe ulcer, right ([place])   . Type II or unspecified type diabetes mellitus without mention of complication, uncontrolled 11/27/2013  . Acute respiratory failure with  hypoxia ([place]) 11/26/2013  . CAP (community acquired pneumonia) 11/26/2013  . CHF exacerbation ([place]) 11/25/2013  . Cardiomyopathy, ischemic 06/13/2013  . At risk for sudden cardiac death Jun 19, 2013  . S/P cardiac catheterization, Rt & Lt heart cath 06/05/2013 19-Jun-2013  . Acute on chronic combined systolic and diastolic HF (heart failure), NYHA class 3 ([place]) 06/04/2013  . Atrial flutter with rapid ventricular response ([place]) 06/02/2013  . Acute renal failure: Cr up to 1.8 06/02/2013  . Hyperkalemia 06/02/2013    Class: Acute  . LBBB (left bundle branch block) 06/01/2013  . Hypotension- secondary to AF and Diltiazem Rx 06/01/2013  . Obesity (BMI 30-39.9) 06/01/2013  . Acute on chronic diastolic heart failure - due to Afib AVR 06/01/2013  . Atrial fibrillation with RVR-  converted with Amiodarone 05/31/2013  . Stroke- Lt brain 2003 04/08/2013  . Hemiplegia affecting right dominant side ([place]) 04/08/2013  . Cataract 04/08/2013  . GERD (gastroesophageal reflux disease) 04/08/2013  . S/P CABG x 3 - 2010 04/08/2013  . S/P AVR-tissue, 2010 04/08/2013  . DM2 (diabetes mellitus, type 2) ([place]) 04/08/2013  . Dyslipidemia 04/08/2013  . SUBACROMIAL BURSITIS, LEFT 11/05/2009    Rayetta Humphrey, PT CLT [phone] 05/16/2017, 12:46 PM  [place], [name], [address] Phone: [phone]   Fax:  [phone]  Name: [name] MRN: [id number] Date of Birth: 12-23-1945  PHYSICAL THERAPY DISCHARGE SUMMARY  Visits from Start of Care: 13  Current functional level related to goals / functional outcomes: See above Remaining deficits: See above   Education / Equipment:  See above Plan: Patient agrees to discharge.  Patient goals were not met. Patient is being discharged due to the patient's request.  ?????       Pt is moving out of town  [place], [place] [phone]

## 2017-05-18 ENCOUNTER — Ambulatory Visit (HOSPITAL_COMMUNITY): Payer: Medicare Other

## 2017-05-22 DIAGNOSIS — M86172 Other acute osteomyelitis, left ankle and foot: Secondary | ICD-10-CM | POA: Diagnosis not present

## 2017-05-22 DIAGNOSIS — N183 Chronic kidney disease, stage 3 (moderate): Secondary | ICD-10-CM | POA: Diagnosis not present

## 2017-05-22 DIAGNOSIS — Z794 Long term (current) use of insulin: Secondary | ICD-10-CM | POA: Diagnosis not present

## 2017-05-22 DIAGNOSIS — E1169 Type 2 diabetes mellitus with other specified complication: Secondary | ICD-10-CM | POA: Diagnosis not present

## 2017-05-22 DIAGNOSIS — Z951 Presence of aortocoronary bypass graft: Secondary | ICD-10-CM | POA: Diagnosis not present

## 2017-05-22 DIAGNOSIS — I10 Essential (primary) hypertension: Secondary | ICD-10-CM | POA: Diagnosis not present

## 2017-05-22 DIAGNOSIS — M869 Osteomyelitis, unspecified: Secondary | ICD-10-CM | POA: Diagnosis not present

## 2017-05-22 DIAGNOSIS — E1165 Type 2 diabetes mellitus with hyperglycemia: Secondary | ICD-10-CM | POA: Diagnosis not present

## 2017-05-22 DIAGNOSIS — E039 Hypothyroidism, unspecified: Secondary | ICD-10-CM | POA: Diagnosis not present

## 2017-05-22 DIAGNOSIS — M86671 Other chronic osteomyelitis, right ankle and foot: Secondary | ICD-10-CM | POA: Diagnosis not present

## 2017-05-22 DIAGNOSIS — M86171 Other acute osteomyelitis, right ankle and foot: Secondary | ICD-10-CM | POA: Diagnosis not present

## 2017-05-22 DIAGNOSIS — Z7901 Long term (current) use of anticoagulants: Secondary | ICD-10-CM | POA: Diagnosis not present

## 2017-05-22 DIAGNOSIS — E119 Type 2 diabetes mellitus without complications: Secondary | ICD-10-CM | POA: Diagnosis not present

## 2017-05-22 DIAGNOSIS — Z7982 Long term (current) use of aspirin: Secondary | ICD-10-CM | POA: Diagnosis not present

## 2017-05-22 DIAGNOSIS — M86672 Other chronic osteomyelitis, left ankle and foot: Secondary | ICD-10-CM | POA: Diagnosis not present

## 2017-05-22 DIAGNOSIS — I129 Hypertensive chronic kidney disease with stage 1 through stage 4 chronic kidney disease, or unspecified chronic kidney disease: Secondary | ICD-10-CM | POA: Diagnosis not present

## 2017-05-22 DIAGNOSIS — I4891 Unspecified atrial fibrillation: Secondary | ICD-10-CM | POA: Diagnosis not present

## 2017-05-22 DIAGNOSIS — I2510 Atherosclerotic heart disease of native coronary artery without angina pectoris: Secondary | ICD-10-CM | POA: Diagnosis not present

## 2017-05-22 DIAGNOSIS — E1122 Type 2 diabetes mellitus with diabetic chronic kidney disease: Secondary | ICD-10-CM | POA: Diagnosis not present

## 2017-05-23 ENCOUNTER — Ambulatory Visit (HOSPITAL_COMMUNITY): Payer: Medicare Other | Admitting: Physical Therapy

## 2017-05-23 DIAGNOSIS — L97909 Non-pressure chronic ulcer of unspecified part of unspecified lower leg with unspecified severity: Secondary | ICD-10-CM | POA: Diagnosis not present

## 2017-05-23 DIAGNOSIS — E119 Type 2 diabetes mellitus without complications: Secondary | ICD-10-CM | POA: Diagnosis not present

## 2017-05-23 DIAGNOSIS — M869 Osteomyelitis, unspecified: Secondary | ICD-10-CM | POA: Diagnosis not present

## 2017-05-23 DIAGNOSIS — I2510 Atherosclerotic heart disease of native coronary artery without angina pectoris: Secondary | ICD-10-CM | POA: Diagnosis not present

## 2017-05-23 DIAGNOSIS — I10 Essential (primary) hypertension: Secondary | ICD-10-CM | POA: Diagnosis not present

## 2017-05-24 ENCOUNTER — Telehealth: Payer: Self-pay | Admitting: Internal Medicine

## 2017-05-24 DIAGNOSIS — E1165 Type 2 diabetes mellitus with hyperglycemia: Secondary | ICD-10-CM | POA: Diagnosis not present

## 2017-05-24 DIAGNOSIS — N183 Chronic kidney disease, stage 3 (moderate): Secondary | ICD-10-CM | POA: Diagnosis not present

## 2017-05-24 DIAGNOSIS — L97529 Non-pressure chronic ulcer of other part of left foot with unspecified severity: Secondary | ICD-10-CM | POA: Diagnosis not present

## 2017-05-24 DIAGNOSIS — E1122 Type 2 diabetes mellitus with diabetic chronic kidney disease: Secondary | ICD-10-CM | POA: Diagnosis not present

## 2017-05-24 DIAGNOSIS — E11621 Type 2 diabetes mellitus with foot ulcer: Secondary | ICD-10-CM | POA: Diagnosis not present

## 2017-05-24 DIAGNOSIS — L97519 Non-pressure chronic ulcer of other part of right foot with unspecified severity: Secondary | ICD-10-CM | POA: Diagnosis not present

## 2017-05-24 NOTE — Telephone Encounter (Signed)
New message  Deirdre Evener from [facility]  1) Are you calling to confirm a diagnosis or obtain personal health information (Y/N)? yes  2) If so, what information is requested? EF, and echo  Please route to Medical Records or your medical records site representative

## 2017-05-25 ENCOUNTER — Ambulatory Visit (HOSPITAL_COMMUNITY): Payer: Medicare Other | Admitting: Physical Therapy

## 2017-05-25 DIAGNOSIS — E11621 Type 2 diabetes mellitus with foot ulcer: Secondary | ICD-10-CM | POA: Diagnosis not present

## 2017-05-26 DIAGNOSIS — L97529 Non-pressure chronic ulcer of other part of left foot with unspecified severity: Secondary | ICD-10-CM | POA: Diagnosis not present

## 2017-05-26 DIAGNOSIS — E1165 Type 2 diabetes mellitus with hyperglycemia: Secondary | ICD-10-CM | POA: Diagnosis not present

## 2017-05-26 DIAGNOSIS — L97519 Non-pressure chronic ulcer of other part of right foot with unspecified severity: Secondary | ICD-10-CM | POA: Diagnosis not present

## 2017-05-26 DIAGNOSIS — N183 Chronic kidney disease, stage 3 (moderate): Secondary | ICD-10-CM | POA: Diagnosis not present

## 2017-05-26 DIAGNOSIS — E11621 Type 2 diabetes mellitus with foot ulcer: Secondary | ICD-10-CM | POA: Diagnosis not present

## 2017-05-26 DIAGNOSIS — E1122 Type 2 diabetes mellitus with diabetic chronic kidney disease: Secondary | ICD-10-CM | POA: Diagnosis not present

## 2017-05-29 ENCOUNTER — Other Ambulatory Visit (HOSPITAL_COMMUNITY): Payer: Self-pay | Admitting: Interventional Radiology

## 2017-05-29 ENCOUNTER — Encounter: Payer: Self-pay | Admitting: Interventional Radiology

## 2017-05-29 DIAGNOSIS — L97519 Non-pressure chronic ulcer of other part of right foot with unspecified severity: Secondary | ICD-10-CM | POA: Diagnosis not present

## 2017-05-29 DIAGNOSIS — E1122 Type 2 diabetes mellitus with diabetic chronic kidney disease: Secondary | ICD-10-CM | POA: Diagnosis not present

## 2017-05-29 DIAGNOSIS — L97529 Non-pressure chronic ulcer of other part of left foot with unspecified severity: Secondary | ICD-10-CM | POA: Diagnosis not present

## 2017-05-29 DIAGNOSIS — M8648 Chronic osteomyelitis with draining sinus, other site: Secondary | ICD-10-CM | POA: Diagnosis not present

## 2017-05-29 DIAGNOSIS — M86472 Chronic osteomyelitis with draining sinus, left ankle and foot: Secondary | ICD-10-CM | POA: Diagnosis not present

## 2017-05-29 DIAGNOSIS — E11621 Type 2 diabetes mellitus with foot ulcer: Secondary | ICD-10-CM | POA: Diagnosis not present

## 2017-05-29 DIAGNOSIS — N183 Chronic kidney disease, stage 3 (moderate): Secondary | ICD-10-CM | POA: Diagnosis not present

## 2017-05-29 DIAGNOSIS — E1169 Type 2 diabetes mellitus with other specified complication: Secondary | ICD-10-CM | POA: Diagnosis not present

## 2017-05-29 DIAGNOSIS — E1165 Type 2 diabetes mellitus with hyperglycemia: Secondary | ICD-10-CM | POA: Diagnosis not present

## 2017-05-29 DIAGNOSIS — M86471 Chronic osteomyelitis with draining sinus, right ankle and foot: Secondary | ICD-10-CM | POA: Diagnosis not present

## 2017-05-31 ENCOUNTER — Telehealth (HOSPITAL_COMMUNITY): Payer: Self-pay | Admitting: Internal Medicine

## 2017-05-31 DIAGNOSIS — E1165 Type 2 diabetes mellitus with hyperglycemia: Secondary | ICD-10-CM | POA: Diagnosis not present

## 2017-05-31 DIAGNOSIS — E11621 Type 2 diabetes mellitus with foot ulcer: Secondary | ICD-10-CM | POA: Diagnosis not present

## 2017-05-31 DIAGNOSIS — L97529 Non-pressure chronic ulcer of other part of left foot with unspecified severity: Secondary | ICD-10-CM | POA: Diagnosis not present

## 2017-05-31 DIAGNOSIS — L97519 Non-pressure chronic ulcer of other part of right foot with unspecified severity: Secondary | ICD-10-CM | POA: Diagnosis not present

## 2017-05-31 DIAGNOSIS — N183 Chronic kidney disease, stage 3 (moderate): Secondary | ICD-10-CM | POA: Diagnosis not present

## 2017-05-31 DIAGNOSIS — E1122 Type 2 diabetes mellitus with diabetic chronic kidney disease: Secondary | ICD-10-CM | POA: Diagnosis not present

## 2017-05-31 NOTE — Telephone Encounter (Signed)
Received fax from [facility] case manager RN requesting info. Returned fax with last BP, HR, height, weight, EF to [phone]

## 2017-05-31 NOTE — Telephone Encounter (Signed)
05/31/17  Pt called and said that he had been getting calls from [place] and he will send [place] some money as soon as he can.

## 2017-06-02 DIAGNOSIS — E1165 Type 2 diabetes mellitus with hyperglycemia: Secondary | ICD-10-CM | POA: Diagnosis not present

## 2017-06-02 DIAGNOSIS — E11621 Type 2 diabetes mellitus with foot ulcer: Secondary | ICD-10-CM | POA: Diagnosis not present

## 2017-06-02 DIAGNOSIS — L97519 Non-pressure chronic ulcer of other part of right foot with unspecified severity: Secondary | ICD-10-CM | POA: Diagnosis not present

## 2017-06-02 DIAGNOSIS — E1122 Type 2 diabetes mellitus with diabetic chronic kidney disease: Secondary | ICD-10-CM | POA: Diagnosis not present

## 2017-06-02 DIAGNOSIS — L97529 Non-pressure chronic ulcer of other part of left foot with unspecified severity: Secondary | ICD-10-CM | POA: Diagnosis not present

## 2017-06-02 DIAGNOSIS — N183 Chronic kidney disease, stage 3 (moderate): Secondary | ICD-10-CM | POA: Diagnosis not present

## 2017-06-05 DIAGNOSIS — E1122 Type 2 diabetes mellitus with diabetic chronic kidney disease: Secondary | ICD-10-CM | POA: Diagnosis not present

## 2017-06-05 DIAGNOSIS — E1165 Type 2 diabetes mellitus with hyperglycemia: Secondary | ICD-10-CM | POA: Diagnosis not present

## 2017-06-05 DIAGNOSIS — N183 Chronic kidney disease, stage 3 (moderate): Secondary | ICD-10-CM | POA: Diagnosis not present

## 2017-06-05 DIAGNOSIS — L97529 Non-pressure chronic ulcer of other part of left foot with unspecified severity: Secondary | ICD-10-CM | POA: Diagnosis not present

## 2017-06-05 DIAGNOSIS — L97519 Non-pressure chronic ulcer of other part of right foot with unspecified severity: Secondary | ICD-10-CM | POA: Diagnosis not present

## 2017-06-05 DIAGNOSIS — E11621 Type 2 diabetes mellitus with foot ulcer: Secondary | ICD-10-CM | POA: Diagnosis not present

## 2017-06-06 DIAGNOSIS — E1151 Type 2 diabetes mellitus with diabetic peripheral angiopathy without gangrene: Secondary | ICD-10-CM | POA: Diagnosis not present

## 2017-06-06 DIAGNOSIS — M86471 Chronic osteomyelitis with draining sinus, right ankle and foot: Secondary | ICD-10-CM | POA: Diagnosis not present

## 2017-06-06 DIAGNOSIS — L97512 Non-pressure chronic ulcer of other part of right foot with fat layer exposed: Secondary | ICD-10-CM | POA: Diagnosis not present

## 2017-06-06 DIAGNOSIS — M86671 Other chronic osteomyelitis, right ankle and foot: Secondary | ICD-10-CM | POA: Diagnosis not present

## 2017-06-07 DIAGNOSIS — E1165 Type 2 diabetes mellitus with hyperglycemia: Secondary | ICD-10-CM | POA: Diagnosis not present

## 2017-06-07 DIAGNOSIS — L97529 Non-pressure chronic ulcer of other part of left foot with unspecified severity: Secondary | ICD-10-CM | POA: Diagnosis not present

## 2017-06-07 DIAGNOSIS — E1122 Type 2 diabetes mellitus with diabetic chronic kidney disease: Secondary | ICD-10-CM | POA: Diagnosis not present

## 2017-06-07 DIAGNOSIS — E11621 Type 2 diabetes mellitus with foot ulcer: Secondary | ICD-10-CM | POA: Diagnosis not present

## 2017-06-07 DIAGNOSIS — L97519 Non-pressure chronic ulcer of other part of right foot with unspecified severity: Secondary | ICD-10-CM | POA: Diagnosis not present

## 2017-06-07 DIAGNOSIS — N183 Chronic kidney disease, stage 3 (moderate): Secondary | ICD-10-CM | POA: Diagnosis not present

## 2017-06-09 DIAGNOSIS — E11621 Type 2 diabetes mellitus with foot ulcer: Secondary | ICD-10-CM | POA: Diagnosis not present

## 2017-06-09 DIAGNOSIS — E1122 Type 2 diabetes mellitus with diabetic chronic kidney disease: Secondary | ICD-10-CM | POA: Diagnosis not present

## 2017-06-09 DIAGNOSIS — N183 Chronic kidney disease, stage 3 (moderate): Secondary | ICD-10-CM | POA: Diagnosis not present

## 2017-06-09 DIAGNOSIS — E7800 Pure hypercholesterolemia, unspecified: Secondary | ICD-10-CM | POA: Diagnosis not present

## 2017-06-09 DIAGNOSIS — I639 Cerebral infarction, unspecified: Secondary | ICD-10-CM | POA: Diagnosis not present

## 2017-06-09 DIAGNOSIS — L97519 Non-pressure chronic ulcer of other part of right foot with unspecified severity: Secondary | ICD-10-CM | POA: Diagnosis not present

## 2017-06-09 DIAGNOSIS — E119 Type 2 diabetes mellitus without complications: Secondary | ICD-10-CM | POA: Diagnosis not present

## 2017-06-09 DIAGNOSIS — I69359 Hemiplegia and hemiparesis following cerebral infarction affecting unspecified side: Secondary | ICD-10-CM | POA: Diagnosis not present

## 2017-06-09 DIAGNOSIS — E1165 Type 2 diabetes mellitus with hyperglycemia: Secondary | ICD-10-CM | POA: Diagnosis not present

## 2017-06-09 DIAGNOSIS — L97529 Non-pressure chronic ulcer of other part of left foot with unspecified severity: Secondary | ICD-10-CM | POA: Diagnosis not present

## 2017-06-09 DIAGNOSIS — I2510 Atherosclerotic heart disease of native coronary artery without angina pectoris: Secondary | ICD-10-CM | POA: Diagnosis not present

## 2017-06-13 DIAGNOSIS — E1122 Type 2 diabetes mellitus with diabetic chronic kidney disease: Secondary | ICD-10-CM | POA: Diagnosis not present

## 2017-06-13 DIAGNOSIS — N183 Chronic kidney disease, stage 3 (moderate): Secondary | ICD-10-CM | POA: Diagnosis not present

## 2017-06-13 DIAGNOSIS — E11621 Type 2 diabetes mellitus with foot ulcer: Secondary | ICD-10-CM | POA: Diagnosis not present

## 2017-06-13 DIAGNOSIS — E1165 Type 2 diabetes mellitus with hyperglycemia: Secondary | ICD-10-CM | POA: Diagnosis not present

## 2017-06-13 DIAGNOSIS — L97529 Non-pressure chronic ulcer of other part of left foot with unspecified severity: Secondary | ICD-10-CM | POA: Diagnosis not present

## 2017-06-13 DIAGNOSIS — L97519 Non-pressure chronic ulcer of other part of right foot with unspecified severity: Secondary | ICD-10-CM | POA: Diagnosis not present

## 2017-06-15 DIAGNOSIS — M86471 Chronic osteomyelitis with draining sinus, right ankle and foot: Secondary | ICD-10-CM | POA: Diagnosis not present

## 2017-06-15 DIAGNOSIS — M86472 Chronic osteomyelitis with draining sinus, left ankle and foot: Secondary | ICD-10-CM | POA: Diagnosis not present

## 2017-06-15 DIAGNOSIS — M86671 Other chronic osteomyelitis, right ankle and foot: Secondary | ICD-10-CM | POA: Diagnosis not present

## 2017-06-15 DIAGNOSIS — M86672 Other chronic osteomyelitis, left ankle and foot: Secondary | ICD-10-CM | POA: Diagnosis not present

## 2017-06-15 DIAGNOSIS — M869 Osteomyelitis, unspecified: Secondary | ICD-10-CM | POA: Diagnosis not present

## 2017-06-16 DIAGNOSIS — L97529 Non-pressure chronic ulcer of other part of left foot with unspecified severity: Secondary | ICD-10-CM | POA: Diagnosis not present

## 2017-06-16 DIAGNOSIS — E1122 Type 2 diabetes mellitus with diabetic chronic kidney disease: Secondary | ICD-10-CM | POA: Diagnosis not present

## 2017-06-16 DIAGNOSIS — E1165 Type 2 diabetes mellitus with hyperglycemia: Secondary | ICD-10-CM | POA: Diagnosis not present

## 2017-06-16 DIAGNOSIS — N183 Chronic kidney disease, stage 3 (moderate): Secondary | ICD-10-CM | POA: Diagnosis not present

## 2017-06-16 DIAGNOSIS — L97519 Non-pressure chronic ulcer of other part of right foot with unspecified severity: Secondary | ICD-10-CM | POA: Diagnosis not present

## 2017-06-16 DIAGNOSIS — E11621 Type 2 diabetes mellitus with foot ulcer: Secondary | ICD-10-CM | POA: Diagnosis not present

## 2017-06-19 DIAGNOSIS — N183 Chronic kidney disease, stage 3 (moderate): Secondary | ICD-10-CM | POA: Diagnosis not present

## 2017-06-19 DIAGNOSIS — E1165 Type 2 diabetes mellitus with hyperglycemia: Secondary | ICD-10-CM | POA: Diagnosis not present

## 2017-06-19 DIAGNOSIS — L97529 Non-pressure chronic ulcer of other part of left foot with unspecified severity: Secondary | ICD-10-CM | POA: Diagnosis not present

## 2017-06-19 DIAGNOSIS — L97519 Non-pressure chronic ulcer of other part of right foot with unspecified severity: Secondary | ICD-10-CM | POA: Diagnosis not present

## 2017-06-19 DIAGNOSIS — E11621 Type 2 diabetes mellitus with foot ulcer: Secondary | ICD-10-CM | POA: Diagnosis not present

## 2017-06-19 DIAGNOSIS — E1122 Type 2 diabetes mellitus with diabetic chronic kidney disease: Secondary | ICD-10-CM | POA: Diagnosis not present

## 2017-06-20 DIAGNOSIS — L723 Sebaceous cyst: Secondary | ICD-10-CM | POA: Diagnosis not present

## 2017-06-20 DIAGNOSIS — L02212 Cutaneous abscess of back [any part, except buttock]: Secondary | ICD-10-CM | POA: Diagnosis not present

## 2017-06-20 DIAGNOSIS — L089 Local infection of the skin and subcutaneous tissue, unspecified: Secondary | ICD-10-CM | POA: Diagnosis not present

## 2017-06-22 DIAGNOSIS — L97519 Non-pressure chronic ulcer of other part of right foot with unspecified severity: Secondary | ICD-10-CM | POA: Diagnosis not present

## 2017-06-22 DIAGNOSIS — L97529 Non-pressure chronic ulcer of other part of left foot with unspecified severity: Secondary | ICD-10-CM | POA: Diagnosis not present

## 2017-06-22 DIAGNOSIS — N183 Chronic kidney disease, stage 3 (moderate): Secondary | ICD-10-CM | POA: Diagnosis not present

## 2017-06-22 DIAGNOSIS — E1122 Type 2 diabetes mellitus with diabetic chronic kidney disease: Secondary | ICD-10-CM | POA: Diagnosis not present

## 2017-06-22 DIAGNOSIS — E11621 Type 2 diabetes mellitus with foot ulcer: Secondary | ICD-10-CM | POA: Diagnosis not present

## 2017-06-22 DIAGNOSIS — E1165 Type 2 diabetes mellitus with hyperglycemia: Secondary | ICD-10-CM | POA: Diagnosis not present

## 2017-06-23 DIAGNOSIS — M86671 Other chronic osteomyelitis, right ankle and foot: Secondary | ICD-10-CM | POA: Diagnosis not present

## 2017-06-23 DIAGNOSIS — E10621 Type 1 diabetes mellitus with foot ulcer: Secondary | ICD-10-CM | POA: Diagnosis not present

## 2017-06-23 DIAGNOSIS — M86672 Other chronic osteomyelitis, left ankle and foot: Secondary | ICD-10-CM | POA: Diagnosis not present

## 2017-06-23 DIAGNOSIS — I739 Peripheral vascular disease, unspecified: Secondary | ICD-10-CM | POA: Diagnosis not present

## 2017-06-23 DIAGNOSIS — L97524 Non-pressure chronic ulcer of other part of left foot with necrosis of bone: Secondary | ICD-10-CM | POA: Diagnosis not present

## 2017-06-23 DIAGNOSIS — E1165 Type 2 diabetes mellitus with hyperglycemia: Secondary | ICD-10-CM | POA: Diagnosis not present

## 2017-06-23 DIAGNOSIS — L97514 Non-pressure chronic ulcer of other part of right foot with necrosis of bone: Secondary | ICD-10-CM | POA: Diagnosis not present

## 2017-06-23 DIAGNOSIS — Z794 Long term (current) use of insulin: Secondary | ICD-10-CM | POA: Diagnosis not present

## 2017-06-23 DIAGNOSIS — N182 Chronic kidney disease, stage 2 (mild): Secondary | ICD-10-CM | POA: Diagnosis not present

## 2017-06-27 DIAGNOSIS — L97529 Non-pressure chronic ulcer of other part of left foot with unspecified severity: Secondary | ICD-10-CM | POA: Diagnosis not present

## 2017-06-27 DIAGNOSIS — N183 Chronic kidney disease, stage 3 (moderate): Secondary | ICD-10-CM | POA: Diagnosis not present

## 2017-06-27 DIAGNOSIS — E1122 Type 2 diabetes mellitus with diabetic chronic kidney disease: Secondary | ICD-10-CM | POA: Diagnosis not present

## 2017-06-27 DIAGNOSIS — L02212 Cutaneous abscess of back [any part, except buttock]: Secondary | ICD-10-CM | POA: Diagnosis not present

## 2017-06-27 DIAGNOSIS — E11621 Type 2 diabetes mellitus with foot ulcer: Secondary | ICD-10-CM | POA: Diagnosis not present

## 2017-06-27 DIAGNOSIS — L97519 Non-pressure chronic ulcer of other part of right foot with unspecified severity: Secondary | ICD-10-CM | POA: Diagnosis not present

## 2017-06-27 DIAGNOSIS — E1165 Type 2 diabetes mellitus with hyperglycemia: Secondary | ICD-10-CM | POA: Diagnosis not present

## 2017-06-29 DIAGNOSIS — I739 Peripheral vascular disease, unspecified: Secondary | ICD-10-CM | POA: Diagnosis not present

## 2017-06-29 DIAGNOSIS — E11621 Type 2 diabetes mellitus with foot ulcer: Secondary | ICD-10-CM | POA: Diagnosis not present

## 2017-06-29 DIAGNOSIS — N179 Acute kidney failure, unspecified: Secondary | ICD-10-CM | POA: Diagnosis not present

## 2017-06-29 DIAGNOSIS — Z89431 Acquired absence of right foot: Secondary | ICD-10-CM | POA: Diagnosis not present

## 2017-06-29 DIAGNOSIS — E1140 Type 2 diabetes mellitus with diabetic neuropathy, unspecified: Secondary | ICD-10-CM | POA: Diagnosis not present

## 2017-06-29 DIAGNOSIS — M868X7 Other osteomyelitis, ankle and foot: Secondary | ICD-10-CM | POA: Diagnosis not present

## 2017-06-29 DIAGNOSIS — M86171 Other acute osteomyelitis, right ankle and foot: Secondary | ICD-10-CM | POA: Diagnosis not present

## 2017-06-29 DIAGNOSIS — I5022 Chronic systolic (congestive) heart failure: Secondary | ICD-10-CM | POA: Diagnosis not present

## 2017-06-29 DIAGNOSIS — T82856A Stenosis of peripheral vascular stent, initial encounter: Secondary | ICD-10-CM | POA: Diagnosis not present

## 2017-06-29 DIAGNOSIS — I447 Left bundle-branch block, unspecified: Secondary | ICD-10-CM | POA: Diagnosis not present

## 2017-06-29 DIAGNOSIS — M7732 Calcaneal spur, left foot: Secondary | ICD-10-CM | POA: Diagnosis not present

## 2017-06-29 DIAGNOSIS — M7731 Calcaneal spur, right foot: Secondary | ICD-10-CM | POA: Diagnosis not present

## 2017-06-29 DIAGNOSIS — E1149 Type 2 diabetes mellitus with other diabetic neurological complication: Secondary | ICD-10-CM | POA: Diagnosis not present

## 2017-06-29 DIAGNOSIS — I480 Paroxysmal atrial fibrillation: Secondary | ICD-10-CM | POA: Diagnosis not present

## 2017-06-29 DIAGNOSIS — I5032 Chronic diastolic (congestive) heart failure: Secondary | ICD-10-CM | POA: Diagnosis not present

## 2017-06-29 DIAGNOSIS — M86672 Other chronic osteomyelitis, left ankle and foot: Secondary | ICD-10-CM | POA: Diagnosis not present

## 2017-06-29 DIAGNOSIS — M86671 Other chronic osteomyelitis, right ankle and foot: Secondary | ICD-10-CM | POA: Diagnosis not present

## 2017-06-29 DIAGNOSIS — I96 Gangrene, not elsewhere classified: Secondary | ICD-10-CM | POA: Diagnosis not present

## 2017-06-29 DIAGNOSIS — I2510 Atherosclerotic heart disease of native coronary artery without angina pectoris: Secondary | ICD-10-CM | POA: Diagnosis not present

## 2017-06-29 DIAGNOSIS — B964 Proteus (mirabilis) (morganii) as the cause of diseases classified elsewhere: Secondary | ICD-10-CM | POA: Diagnosis not present

## 2017-06-29 DIAGNOSIS — L97529 Non-pressure chronic ulcer of other part of left foot with unspecified severity: Secondary | ICD-10-CM | POA: Diagnosis not present

## 2017-06-29 DIAGNOSIS — M1990 Unspecified osteoarthritis, unspecified site: Secondary | ICD-10-CM | POA: Diagnosis not present

## 2017-06-29 DIAGNOSIS — E10621 Type 1 diabetes mellitus with foot ulcer: Secondary | ICD-10-CM | POA: Diagnosis not present

## 2017-06-29 DIAGNOSIS — D62 Acute posthemorrhagic anemia: Secondary | ICD-10-CM | POA: Diagnosis not present

## 2017-06-29 DIAGNOSIS — R509 Fever, unspecified: Secondary | ICD-10-CM | POA: Diagnosis not present

## 2017-06-29 DIAGNOSIS — I4891 Unspecified atrial fibrillation: Secondary | ICD-10-CM | POA: Diagnosis not present

## 2017-06-29 DIAGNOSIS — K219 Gastro-esophageal reflux disease without esophagitis: Secondary | ICD-10-CM | POA: Diagnosis not present

## 2017-06-29 DIAGNOSIS — G8929 Other chronic pain: Secondary | ICD-10-CM | POA: Diagnosis not present

## 2017-06-29 DIAGNOSIS — I517 Cardiomegaly: Secondary | ICD-10-CM | POA: Diagnosis not present

## 2017-06-29 DIAGNOSIS — I255 Ischemic cardiomyopathy: Secondary | ICD-10-CM | POA: Diagnosis not present

## 2017-06-29 DIAGNOSIS — I482 Chronic atrial fibrillation: Secondary | ICD-10-CM | POA: Diagnosis not present

## 2017-06-29 DIAGNOSIS — L97519 Non-pressure chronic ulcer of other part of right foot with unspecified severity: Secondary | ICD-10-CM | POA: Diagnosis not present

## 2017-06-29 DIAGNOSIS — A4150 Gram-negative sepsis, unspecified: Secondary | ICD-10-CM | POA: Diagnosis not present

## 2017-06-29 DIAGNOSIS — I130 Hypertensive heart and chronic kidney disease with heart failure and stage 1 through stage 4 chronic kidney disease, or unspecified chronic kidney disease: Secondary | ICD-10-CM | POA: Diagnosis not present

## 2017-06-29 DIAGNOSIS — S91301A Unspecified open wound, right foot, initial encounter: Secondary | ICD-10-CM | POA: Diagnosis not present

## 2017-06-29 DIAGNOSIS — I998 Other disorder of circulatory system: Secondary | ICD-10-CM | POA: Diagnosis not present

## 2017-06-29 DIAGNOSIS — S92332K Displaced fracture of third metatarsal bone, left foot, subsequent encounter for fracture with nonunion: Secondary | ICD-10-CM | POA: Diagnosis not present

## 2017-06-29 DIAGNOSIS — I110 Hypertensive heart disease with heart failure: Secondary | ICD-10-CM | POA: Diagnosis not present

## 2017-06-29 DIAGNOSIS — L97524 Non-pressure chronic ulcer of other part of left foot with necrosis of bone: Secondary | ICD-10-CM | POA: Diagnosis not present

## 2017-06-29 DIAGNOSIS — Z7901 Long term (current) use of anticoagulants: Secondary | ICD-10-CM | POA: Diagnosis not present

## 2017-06-29 DIAGNOSIS — E039 Hypothyroidism, unspecified: Secondary | ICD-10-CM | POA: Diagnosis not present

## 2017-06-29 DIAGNOSIS — R7881 Bacteremia: Secondary | ICD-10-CM | POA: Diagnosis not present

## 2017-06-29 DIAGNOSIS — L97514 Non-pressure chronic ulcer of other part of right foot with necrosis of bone: Secondary | ICD-10-CM | POA: Diagnosis not present

## 2017-06-29 DIAGNOSIS — L97509 Non-pressure chronic ulcer of other part of unspecified foot with unspecified severity: Secondary | ICD-10-CM | POA: Diagnosis not present

## 2017-06-29 DIAGNOSIS — I5042 Chronic combined systolic (congestive) and diastolic (congestive) heart failure: Secondary | ICD-10-CM | POA: Diagnosis not present

## 2017-06-29 DIAGNOSIS — Z7409 Other reduced mobility: Secondary | ICD-10-CM | POA: Diagnosis not present

## 2017-06-29 DIAGNOSIS — N182 Chronic kidney disease, stage 2 (mild): Secondary | ICD-10-CM | POA: Diagnosis not present

## 2017-06-29 DIAGNOSIS — Z952 Presence of prosthetic heart valve: Secondary | ICD-10-CM | POA: Diagnosis not present

## 2017-06-29 DIAGNOSIS — I10 Essential (primary) hypertension: Secondary | ICD-10-CM | POA: Diagnosis not present

## 2017-06-29 DIAGNOSIS — E1122 Type 2 diabetes mellitus with diabetic chronic kidney disease: Secondary | ICD-10-CM | POA: Diagnosis not present

## 2017-06-29 DIAGNOSIS — M869 Osteomyelitis, unspecified: Secondary | ICD-10-CM | POA: Diagnosis not present

## 2017-06-29 DIAGNOSIS — R2243 Localized swelling, mass and lump, lower limb, bilateral: Secondary | ICD-10-CM | POA: Diagnosis not present

## 2017-06-29 DIAGNOSIS — I69398 Other sequelae of cerebral infarction: Secondary | ICD-10-CM | POA: Diagnosis not present

## 2017-06-29 DIAGNOSIS — Z4781 Encounter for orthopedic aftercare following surgical amputation: Secondary | ICD-10-CM | POA: Diagnosis not present

## 2017-06-29 DIAGNOSIS — E119 Type 2 diabetes mellitus without complications: Secondary | ICD-10-CM | POA: Diagnosis not present

## 2017-06-29 DIAGNOSIS — M86172 Other acute osteomyelitis, left ankle and foot: Secondary | ICD-10-CM | POA: Diagnosis not present

## 2017-06-29 DIAGNOSIS — E785 Hyperlipidemia, unspecified: Secondary | ICD-10-CM | POA: Diagnosis not present

## 2017-06-29 DIAGNOSIS — Z01818 Encounter for other preprocedural examination: Secondary | ICD-10-CM | POA: Diagnosis not present

## 2017-06-29 DIAGNOSIS — E1152 Type 2 diabetes mellitus with diabetic peripheral angiopathy with gangrene: Secondary | ICD-10-CM | POA: Diagnosis not present

## 2017-06-29 DIAGNOSIS — E1169 Type 2 diabetes mellitus with other specified complication: Secondary | ICD-10-CM | POA: Diagnosis not present

## 2017-06-29 DIAGNOSIS — E1165 Type 2 diabetes mellitus with hyperglycemia: Secondary | ICD-10-CM | POA: Diagnosis not present

## 2017-07-13 DIAGNOSIS — Z7409 Other reduced mobility: Secondary | ICD-10-CM | POA: Diagnosis not present

## 2017-07-13 DIAGNOSIS — I2510 Atherosclerotic heart disease of native coronary artery without angina pectoris: Secondary | ICD-10-CM | POA: Diagnosis not present

## 2017-07-13 DIAGNOSIS — K219 Gastro-esophageal reflux disease without esophagitis: Secondary | ICD-10-CM | POA: Diagnosis not present

## 2017-07-13 DIAGNOSIS — I10 Essential (primary) hypertension: Secondary | ICD-10-CM | POA: Diagnosis not present

## 2017-07-13 DIAGNOSIS — E1165 Type 2 diabetes mellitus with hyperglycemia: Secondary | ICD-10-CM | POA: Diagnosis not present

## 2017-07-13 DIAGNOSIS — Z89431 Acquired absence of right foot: Secondary | ICD-10-CM | POA: Diagnosis not present

## 2017-07-13 DIAGNOSIS — N179 Acute kidney failure, unspecified: Secondary | ICD-10-CM | POA: Diagnosis not present

## 2017-07-13 DIAGNOSIS — I69398 Other sequelae of cerebral infarction: Secondary | ICD-10-CM | POA: Diagnosis not present

## 2017-07-13 DIAGNOSIS — I739 Peripheral vascular disease, unspecified: Secondary | ICD-10-CM | POA: Diagnosis not present

## 2017-07-13 DIAGNOSIS — I5042 Chronic combined systolic (congestive) and diastolic (congestive) heart failure: Secondary | ICD-10-CM | POA: Diagnosis not present

## 2017-07-13 DIAGNOSIS — E1140 Type 2 diabetes mellitus with diabetic neuropathy, unspecified: Secondary | ICD-10-CM | POA: Diagnosis not present

## 2017-07-13 DIAGNOSIS — E785 Hyperlipidemia, unspecified: Secondary | ICD-10-CM | POA: Diagnosis not present

## 2017-07-13 DIAGNOSIS — M86672 Other chronic osteomyelitis, left ankle and foot: Secondary | ICD-10-CM | POA: Diagnosis not present

## 2017-07-13 DIAGNOSIS — E039 Hypothyroidism, unspecified: Secondary | ICD-10-CM | POA: Diagnosis not present

## 2017-07-13 DIAGNOSIS — Z794 Long term (current) use of insulin: Secondary | ICD-10-CM | POA: Diagnosis not present

## 2017-07-13 DIAGNOSIS — I255 Ischemic cardiomyopathy: Secondary | ICD-10-CM | POA: Diagnosis not present

## 2017-07-13 DIAGNOSIS — I447 Left bundle-branch block, unspecified: Secondary | ICD-10-CM | POA: Diagnosis not present

## 2017-07-13 DIAGNOSIS — G8929 Other chronic pain: Secondary | ICD-10-CM | POA: Diagnosis not present

## 2017-07-13 DIAGNOSIS — L98429 Non-pressure chronic ulcer of back with unspecified severity: Secondary | ICD-10-CM | POA: Diagnosis not present

## 2017-07-13 DIAGNOSIS — R2689 Other abnormalities of gait and mobility: Secondary | ICD-10-CM | POA: Diagnosis not present

## 2017-07-13 DIAGNOSIS — R7881 Bacteremia: Secondary | ICD-10-CM | POA: Diagnosis not present

## 2017-07-13 DIAGNOSIS — M1990 Unspecified osteoarthritis, unspecified site: Secondary | ICD-10-CM | POA: Diagnosis not present

## 2017-07-13 DIAGNOSIS — I482 Chronic atrial fibrillation: Secondary | ICD-10-CM | POA: Diagnosis not present

## 2017-07-13 DIAGNOSIS — Z7901 Long term (current) use of anticoagulants: Secondary | ICD-10-CM | POA: Diagnosis not present

## 2017-07-13 DIAGNOSIS — I4891 Unspecified atrial fibrillation: Secondary | ICD-10-CM | POA: Diagnosis not present

## 2017-07-13 DIAGNOSIS — N182 Chronic kidney disease, stage 2 (mild): Secondary | ICD-10-CM | POA: Diagnosis not present

## 2017-07-13 DIAGNOSIS — L03312 Cellulitis of back [any part except buttock]: Secondary | ICD-10-CM | POA: Diagnosis not present

## 2017-07-13 DIAGNOSIS — E1149 Type 2 diabetes mellitus with other diabetic neurological complication: Secondary | ICD-10-CM | POA: Diagnosis not present

## 2017-07-13 DIAGNOSIS — D62 Acute posthemorrhagic anemia: Secondary | ICD-10-CM | POA: Diagnosis not present

## 2017-07-13 DIAGNOSIS — M86671 Other chronic osteomyelitis, right ankle and foot: Secondary | ICD-10-CM | POA: Diagnosis not present

## 2017-07-13 DIAGNOSIS — I5032 Chronic diastolic (congestive) heart failure: Secondary | ICD-10-CM | POA: Diagnosis not present

## 2017-07-13 DIAGNOSIS — E11621 Type 2 diabetes mellitus with foot ulcer: Secondary | ICD-10-CM | POA: Diagnosis not present

## 2017-07-13 DIAGNOSIS — Z4781 Encounter for orthopedic aftercare following surgical amputation: Secondary | ICD-10-CM | POA: Diagnosis not present

## 2017-07-13 DIAGNOSIS — I5022 Chronic systolic (congestive) heart failure: Secondary | ICD-10-CM | POA: Diagnosis not present

## 2017-07-17 DIAGNOSIS — M86671 Other chronic osteomyelitis, right ankle and foot: Secondary | ICD-10-CM | POA: Diagnosis not present

## 2017-07-17 DIAGNOSIS — M86672 Other chronic osteomyelitis, left ankle and foot: Secondary | ICD-10-CM | POA: Diagnosis not present

## 2017-07-17 DIAGNOSIS — I739 Peripheral vascular disease, unspecified: Secondary | ICD-10-CM | POA: Diagnosis not present

## 2017-07-17 DIAGNOSIS — N179 Acute kidney failure, unspecified: Secondary | ICD-10-CM | POA: Diagnosis not present

## 2017-08-07 DIAGNOSIS — L03312 Cellulitis of back [any part except buttock]: Secondary | ICD-10-CM | POA: Diagnosis not present

## 2017-08-11 DIAGNOSIS — L98429 Non-pressure chronic ulcer of back with unspecified severity: Secondary | ICD-10-CM | POA: Diagnosis not present

## 2017-08-14 DIAGNOSIS — Z794 Long term (current) use of insulin: Secondary | ICD-10-CM | POA: Diagnosis not present

## 2017-08-14 DIAGNOSIS — I10 Essential (primary) hypertension: Secondary | ICD-10-CM | POA: Diagnosis not present

## 2017-08-14 DIAGNOSIS — E039 Hypothyroidism, unspecified: Secondary | ICD-10-CM | POA: Diagnosis not present

## 2017-08-14 DIAGNOSIS — I5042 Chronic combined systolic (congestive) and diastolic (congestive) heart failure: Secondary | ICD-10-CM | POA: Diagnosis not present

## 2017-08-17 DIAGNOSIS — I739 Peripheral vascular disease, unspecified: Secondary | ICD-10-CM | POA: Diagnosis not present

## 2017-08-17 DIAGNOSIS — Z48812 Encounter for surgical aftercare following surgery on the circulatory system: Secondary | ICD-10-CM | POA: Diagnosis not present

## 2017-08-19 DIAGNOSIS — I69351 Hemiplegia and hemiparesis following cerebral infarction affecting right dominant side: Secondary | ICD-10-CM | POA: Diagnosis not present

## 2017-08-19 DIAGNOSIS — Z89432 Acquired absence of left foot: Secondary | ICD-10-CM | POA: Diagnosis not present

## 2017-08-19 DIAGNOSIS — I482 Chronic atrial fibrillation: Secondary | ICD-10-CM | POA: Diagnosis not present

## 2017-08-19 DIAGNOSIS — Z89431 Acquired absence of right foot: Secondary | ICD-10-CM | POA: Diagnosis not present

## 2017-08-19 DIAGNOSIS — L02222 Furuncle of back [any part, except buttock]: Secondary | ICD-10-CM | POA: Diagnosis not present

## 2017-08-19 DIAGNOSIS — I255 Ischemic cardiomyopathy: Secondary | ICD-10-CM | POA: Diagnosis not present

## 2017-08-19 DIAGNOSIS — I5042 Chronic combined systolic (congestive) and diastolic (congestive) heart failure: Secondary | ICD-10-CM | POA: Diagnosis not present

## 2017-08-19 DIAGNOSIS — I130 Hypertensive heart and chronic kidney disease with heart failure and stage 1 through stage 4 chronic kidney disease, or unspecified chronic kidney disease: Secondary | ICD-10-CM | POA: Diagnosis not present

## 2017-08-19 DIAGNOSIS — E1151 Type 2 diabetes mellitus with diabetic peripheral angiopathy without gangrene: Secondary | ICD-10-CM | POA: Diagnosis not present

## 2017-08-19 DIAGNOSIS — N182 Chronic kidney disease, stage 2 (mild): Secondary | ICD-10-CM | POA: Diagnosis not present

## 2017-08-19 DIAGNOSIS — Z4781 Encounter for orthopedic aftercare following surgical amputation: Secondary | ICD-10-CM | POA: Diagnosis not present

## 2017-08-19 DIAGNOSIS — E1122 Type 2 diabetes mellitus with diabetic chronic kidney disease: Secondary | ICD-10-CM | POA: Diagnosis not present

## 2017-08-19 DIAGNOSIS — T8789 Other complications of amputation stump: Secondary | ICD-10-CM | POA: Diagnosis not present

## 2017-08-21 DIAGNOSIS — I130 Hypertensive heart and chronic kidney disease with heart failure and stage 1 through stage 4 chronic kidney disease, or unspecified chronic kidney disease: Secondary | ICD-10-CM | POA: Diagnosis not present

## 2017-08-21 DIAGNOSIS — L02222 Furuncle of back [any part, except buttock]: Secondary | ICD-10-CM | POA: Diagnosis not present

## 2017-08-21 DIAGNOSIS — I482 Chronic atrial fibrillation: Secondary | ICD-10-CM | POA: Diagnosis not present

## 2017-08-21 DIAGNOSIS — Z89432 Acquired absence of left foot: Secondary | ICD-10-CM | POA: Diagnosis not present

## 2017-08-21 DIAGNOSIS — I69351 Hemiplegia and hemiparesis following cerebral infarction affecting right dominant side: Secondary | ICD-10-CM | POA: Diagnosis not present

## 2017-08-21 DIAGNOSIS — T8789 Other complications of amputation stump: Secondary | ICD-10-CM | POA: Diagnosis not present

## 2017-08-21 DIAGNOSIS — I5042 Chronic combined systolic (congestive) and diastolic (congestive) heart failure: Secondary | ICD-10-CM | POA: Diagnosis not present

## 2017-08-21 DIAGNOSIS — Z4781 Encounter for orthopedic aftercare following surgical amputation: Secondary | ICD-10-CM | POA: Diagnosis not present

## 2017-08-21 DIAGNOSIS — I255 Ischemic cardiomyopathy: Secondary | ICD-10-CM | POA: Diagnosis not present

## 2017-08-21 DIAGNOSIS — Z89431 Acquired absence of right foot: Secondary | ICD-10-CM | POA: Diagnosis not present

## 2017-08-21 DIAGNOSIS — E1151 Type 2 diabetes mellitus with diabetic peripheral angiopathy without gangrene: Secondary | ICD-10-CM | POA: Diagnosis not present

## 2017-08-21 DIAGNOSIS — N182 Chronic kidney disease, stage 2 (mild): Secondary | ICD-10-CM | POA: Diagnosis not present

## 2017-08-21 DIAGNOSIS — E1122 Type 2 diabetes mellitus with diabetic chronic kidney disease: Secondary | ICD-10-CM | POA: Diagnosis not present

## 2017-08-22 DIAGNOSIS — E139 Other specified diabetes mellitus without complications: Secondary | ICD-10-CM | POA: Diagnosis not present

## 2017-08-22 DIAGNOSIS — G894 Chronic pain syndrome: Secondary | ICD-10-CM | POA: Diagnosis not present

## 2017-08-22 DIAGNOSIS — Z79899 Other long term (current) drug therapy: Secondary | ICD-10-CM | POA: Diagnosis not present

## 2017-08-22 DIAGNOSIS — E119 Type 2 diabetes mellitus without complications: Secondary | ICD-10-CM | POA: Diagnosis not present

## 2017-08-22 DIAGNOSIS — N183 Chronic kidney disease, stage 3 (moderate): Secondary | ICD-10-CM | POA: Diagnosis not present

## 2017-08-22 DIAGNOSIS — I2510 Atherosclerotic heart disease of native coronary artery without angina pectoris: Secondary | ICD-10-CM | POA: Diagnosis not present

## 2017-08-22 DIAGNOSIS — E039 Hypothyroidism, unspecified: Secondary | ICD-10-CM | POA: Diagnosis not present

## 2017-08-22 DIAGNOSIS — I739 Peripheral vascular disease, unspecified: Secondary | ICD-10-CM | POA: Diagnosis not present

## 2017-08-23 DIAGNOSIS — T8789 Other complications of amputation stump: Secondary | ICD-10-CM | POA: Diagnosis not present

## 2017-08-23 DIAGNOSIS — E1151 Type 2 diabetes mellitus with diabetic peripheral angiopathy without gangrene: Secondary | ICD-10-CM | POA: Diagnosis not present

## 2017-08-23 DIAGNOSIS — Z89431 Acquired absence of right foot: Secondary | ICD-10-CM | POA: Diagnosis not present

## 2017-08-23 DIAGNOSIS — N182 Chronic kidney disease, stage 2 (mild): Secondary | ICD-10-CM | POA: Diagnosis not present

## 2017-08-23 DIAGNOSIS — E1122 Type 2 diabetes mellitus with diabetic chronic kidney disease: Secondary | ICD-10-CM | POA: Diagnosis not present

## 2017-08-23 DIAGNOSIS — I255 Ischemic cardiomyopathy: Secondary | ICD-10-CM | POA: Diagnosis not present

## 2017-08-23 DIAGNOSIS — Z89432 Acquired absence of left foot: Secondary | ICD-10-CM | POA: Diagnosis not present

## 2017-08-23 DIAGNOSIS — I5042 Chronic combined systolic (congestive) and diastolic (congestive) heart failure: Secondary | ICD-10-CM | POA: Diagnosis not present

## 2017-08-23 DIAGNOSIS — L02222 Furuncle of back [any part, except buttock]: Secondary | ICD-10-CM | POA: Diagnosis not present

## 2017-08-23 DIAGNOSIS — Z4781 Encounter for orthopedic aftercare following surgical amputation: Secondary | ICD-10-CM | POA: Diagnosis not present

## 2017-08-23 DIAGNOSIS — I69351 Hemiplegia and hemiparesis following cerebral infarction affecting right dominant side: Secondary | ICD-10-CM | POA: Diagnosis not present

## 2017-08-23 DIAGNOSIS — I130 Hypertensive heart and chronic kidney disease with heart failure and stage 1 through stage 4 chronic kidney disease, or unspecified chronic kidney disease: Secondary | ICD-10-CM | POA: Diagnosis not present

## 2017-08-23 DIAGNOSIS — I482 Chronic atrial fibrillation: Secondary | ICD-10-CM | POA: Diagnosis not present

## 2017-08-24 DIAGNOSIS — Z48812 Encounter for surgical aftercare following surgery on the circulatory system: Secondary | ICD-10-CM | POA: Diagnosis not present

## 2017-08-24 DIAGNOSIS — I739 Peripheral vascular disease, unspecified: Secondary | ICD-10-CM | POA: Diagnosis not present

## 2017-08-25 DIAGNOSIS — Z4781 Encounter for orthopedic aftercare following surgical amputation: Secondary | ICD-10-CM | POA: Diagnosis not present

## 2017-08-25 DIAGNOSIS — I130 Hypertensive heart and chronic kidney disease with heart failure and stage 1 through stage 4 chronic kidney disease, or unspecified chronic kidney disease: Secondary | ICD-10-CM | POA: Diagnosis not present

## 2017-08-25 DIAGNOSIS — I69351 Hemiplegia and hemiparesis following cerebral infarction affecting right dominant side: Secondary | ICD-10-CM | POA: Diagnosis not present

## 2017-08-25 DIAGNOSIS — I482 Chronic atrial fibrillation: Secondary | ICD-10-CM | POA: Diagnosis not present

## 2017-08-25 DIAGNOSIS — I5042 Chronic combined systolic (congestive) and diastolic (congestive) heart failure: Secondary | ICD-10-CM | POA: Diagnosis not present

## 2017-08-25 DIAGNOSIS — E1151 Type 2 diabetes mellitus with diabetic peripheral angiopathy without gangrene: Secondary | ICD-10-CM | POA: Diagnosis not present

## 2017-08-25 DIAGNOSIS — L02222 Furuncle of back [any part, except buttock]: Secondary | ICD-10-CM | POA: Diagnosis not present

## 2017-08-25 DIAGNOSIS — T8789 Other complications of amputation stump: Secondary | ICD-10-CM | POA: Diagnosis not present

## 2017-08-25 DIAGNOSIS — E1122 Type 2 diabetes mellitus with diabetic chronic kidney disease: Secondary | ICD-10-CM | POA: Diagnosis not present

## 2017-08-25 DIAGNOSIS — I255 Ischemic cardiomyopathy: Secondary | ICD-10-CM | POA: Diagnosis not present

## 2017-08-25 DIAGNOSIS — Z89432 Acquired absence of left foot: Secondary | ICD-10-CM | POA: Diagnosis not present

## 2017-08-25 DIAGNOSIS — Z89431 Acquired absence of right foot: Secondary | ICD-10-CM | POA: Diagnosis not present

## 2017-08-25 DIAGNOSIS — N182 Chronic kidney disease, stage 2 (mild): Secondary | ICD-10-CM | POA: Diagnosis not present

## 2017-08-28 DIAGNOSIS — I482 Chronic atrial fibrillation: Secondary | ICD-10-CM | POA: Diagnosis not present

## 2017-08-28 DIAGNOSIS — L02222 Furuncle of back [any part, except buttock]: Secondary | ICD-10-CM | POA: Diagnosis not present

## 2017-08-28 DIAGNOSIS — Z4781 Encounter for orthopedic aftercare following surgical amputation: Secondary | ICD-10-CM | POA: Diagnosis not present

## 2017-08-28 DIAGNOSIS — Z89432 Acquired absence of left foot: Secondary | ICD-10-CM | POA: Diagnosis not present

## 2017-08-28 DIAGNOSIS — I255 Ischemic cardiomyopathy: Secondary | ICD-10-CM | POA: Diagnosis not present

## 2017-08-28 DIAGNOSIS — E1122 Type 2 diabetes mellitus with diabetic chronic kidney disease: Secondary | ICD-10-CM | POA: Diagnosis not present

## 2017-08-28 DIAGNOSIS — N182 Chronic kidney disease, stage 2 (mild): Secondary | ICD-10-CM | POA: Diagnosis not present

## 2017-08-28 DIAGNOSIS — E1151 Type 2 diabetes mellitus with diabetic peripheral angiopathy without gangrene: Secondary | ICD-10-CM | POA: Diagnosis not present

## 2017-08-28 DIAGNOSIS — I130 Hypertensive heart and chronic kidney disease with heart failure and stage 1 through stage 4 chronic kidney disease, or unspecified chronic kidney disease: Secondary | ICD-10-CM | POA: Diagnosis not present

## 2017-08-28 DIAGNOSIS — I5042 Chronic combined systolic (congestive) and diastolic (congestive) heart failure: Secondary | ICD-10-CM | POA: Diagnosis not present

## 2017-08-28 DIAGNOSIS — Z89431 Acquired absence of right foot: Secondary | ICD-10-CM | POA: Diagnosis not present

## 2017-08-28 DIAGNOSIS — I69351 Hemiplegia and hemiparesis following cerebral infarction affecting right dominant side: Secondary | ICD-10-CM | POA: Diagnosis not present

## 2017-08-28 DIAGNOSIS — T8789 Other complications of amputation stump: Secondary | ICD-10-CM | POA: Diagnosis not present

## 2017-08-30 DIAGNOSIS — I130 Hypertensive heart and chronic kidney disease with heart failure and stage 1 through stage 4 chronic kidney disease, or unspecified chronic kidney disease: Secondary | ICD-10-CM | POA: Diagnosis not present

## 2017-08-30 DIAGNOSIS — I255 Ischemic cardiomyopathy: Secondary | ICD-10-CM | POA: Diagnosis not present

## 2017-08-30 DIAGNOSIS — N182 Chronic kidney disease, stage 2 (mild): Secondary | ICD-10-CM | POA: Diagnosis not present

## 2017-08-30 DIAGNOSIS — I5042 Chronic combined systolic (congestive) and diastolic (congestive) heart failure: Secondary | ICD-10-CM | POA: Diagnosis not present

## 2017-08-30 DIAGNOSIS — I482 Chronic atrial fibrillation: Secondary | ICD-10-CM | POA: Diagnosis not present

## 2017-08-30 DIAGNOSIS — L02222 Furuncle of back [any part, except buttock]: Secondary | ICD-10-CM | POA: Diagnosis not present

## 2017-08-30 DIAGNOSIS — T8789 Other complications of amputation stump: Secondary | ICD-10-CM | POA: Diagnosis not present

## 2017-08-30 DIAGNOSIS — Z89432 Acquired absence of left foot: Secondary | ICD-10-CM | POA: Diagnosis not present

## 2017-08-30 DIAGNOSIS — I69351 Hemiplegia and hemiparesis following cerebral infarction affecting right dominant side: Secondary | ICD-10-CM | POA: Diagnosis not present

## 2017-08-30 DIAGNOSIS — E1151 Type 2 diabetes mellitus with diabetic peripheral angiopathy without gangrene: Secondary | ICD-10-CM | POA: Diagnosis not present

## 2017-08-30 DIAGNOSIS — E1122 Type 2 diabetes mellitus with diabetic chronic kidney disease: Secondary | ICD-10-CM | POA: Diagnosis not present

## 2017-08-30 DIAGNOSIS — Z89431 Acquired absence of right foot: Secondary | ICD-10-CM | POA: Diagnosis not present

## 2017-08-30 DIAGNOSIS — Z4781 Encounter for orthopedic aftercare following surgical amputation: Secondary | ICD-10-CM | POA: Diagnosis not present

## 2017-09-01 DIAGNOSIS — Z89432 Acquired absence of left foot: Secondary | ICD-10-CM | POA: Diagnosis not present

## 2017-09-01 DIAGNOSIS — L02222 Furuncle of back [any part, except buttock]: Secondary | ICD-10-CM | POA: Diagnosis not present

## 2017-09-01 DIAGNOSIS — Z4781 Encounter for orthopedic aftercare following surgical amputation: Secondary | ICD-10-CM | POA: Diagnosis not present

## 2017-09-01 DIAGNOSIS — I255 Ischemic cardiomyopathy: Secondary | ICD-10-CM | POA: Diagnosis not present

## 2017-09-01 DIAGNOSIS — I69351 Hemiplegia and hemiparesis following cerebral infarction affecting right dominant side: Secondary | ICD-10-CM | POA: Diagnosis not present

## 2017-09-01 DIAGNOSIS — N182 Chronic kidney disease, stage 2 (mild): Secondary | ICD-10-CM | POA: Diagnosis not present

## 2017-09-01 DIAGNOSIS — E1151 Type 2 diabetes mellitus with diabetic peripheral angiopathy without gangrene: Secondary | ICD-10-CM | POA: Diagnosis not present

## 2017-09-01 DIAGNOSIS — I130 Hypertensive heart and chronic kidney disease with heart failure and stage 1 through stage 4 chronic kidney disease, or unspecified chronic kidney disease: Secondary | ICD-10-CM | POA: Diagnosis not present

## 2017-09-01 DIAGNOSIS — Z89431 Acquired absence of right foot: Secondary | ICD-10-CM | POA: Diagnosis not present

## 2017-09-01 DIAGNOSIS — E1122 Type 2 diabetes mellitus with diabetic chronic kidney disease: Secondary | ICD-10-CM | POA: Diagnosis not present

## 2017-09-01 DIAGNOSIS — I482 Chronic atrial fibrillation: Secondary | ICD-10-CM | POA: Diagnosis not present

## 2017-09-01 DIAGNOSIS — T8789 Other complications of amputation stump: Secondary | ICD-10-CM | POA: Diagnosis not present

## 2017-09-01 DIAGNOSIS — I5042 Chronic combined systolic (congestive) and diastolic (congestive) heart failure: Secondary | ICD-10-CM | POA: Diagnosis not present

## 2017-09-04 DIAGNOSIS — Z89432 Acquired absence of left foot: Secondary | ICD-10-CM | POA: Diagnosis not present

## 2017-09-04 DIAGNOSIS — E1122 Type 2 diabetes mellitus with diabetic chronic kidney disease: Secondary | ICD-10-CM | POA: Diagnosis not present

## 2017-09-04 DIAGNOSIS — T8789 Other complications of amputation stump: Secondary | ICD-10-CM | POA: Diagnosis not present

## 2017-09-04 DIAGNOSIS — N182 Chronic kidney disease, stage 2 (mild): Secondary | ICD-10-CM | POA: Diagnosis not present

## 2017-09-04 DIAGNOSIS — E1151 Type 2 diabetes mellitus with diabetic peripheral angiopathy without gangrene: Secondary | ICD-10-CM | POA: Diagnosis not present

## 2017-09-04 DIAGNOSIS — I255 Ischemic cardiomyopathy: Secondary | ICD-10-CM | POA: Diagnosis not present

## 2017-09-04 DIAGNOSIS — Z89431 Acquired absence of right foot: Secondary | ICD-10-CM | POA: Diagnosis not present

## 2017-09-04 DIAGNOSIS — L02222 Furuncle of back [any part, except buttock]: Secondary | ICD-10-CM | POA: Diagnosis not present

## 2017-09-04 DIAGNOSIS — I5042 Chronic combined systolic (congestive) and diastolic (congestive) heart failure: Secondary | ICD-10-CM | POA: Diagnosis not present

## 2017-09-04 DIAGNOSIS — I130 Hypertensive heart and chronic kidney disease with heart failure and stage 1 through stage 4 chronic kidney disease, or unspecified chronic kidney disease: Secondary | ICD-10-CM | POA: Diagnosis not present

## 2017-09-04 DIAGNOSIS — Z4781 Encounter for orthopedic aftercare following surgical amputation: Secondary | ICD-10-CM | POA: Diagnosis not present

## 2017-09-04 DIAGNOSIS — I482 Chronic atrial fibrillation: Secondary | ICD-10-CM | POA: Diagnosis not present

## 2017-09-04 DIAGNOSIS — I69351 Hemiplegia and hemiparesis following cerebral infarction affecting right dominant side: Secondary | ICD-10-CM | POA: Diagnosis not present

## 2017-09-06 DIAGNOSIS — N182 Chronic kidney disease, stage 2 (mild): Secondary | ICD-10-CM | POA: Diagnosis not present

## 2017-09-06 DIAGNOSIS — T8789 Other complications of amputation stump: Secondary | ICD-10-CM | POA: Diagnosis not present

## 2017-09-06 DIAGNOSIS — Z4781 Encounter for orthopedic aftercare following surgical amputation: Secondary | ICD-10-CM | POA: Diagnosis not present

## 2017-09-06 DIAGNOSIS — I130 Hypertensive heart and chronic kidney disease with heart failure and stage 1 through stage 4 chronic kidney disease, or unspecified chronic kidney disease: Secondary | ICD-10-CM | POA: Diagnosis not present

## 2017-09-06 DIAGNOSIS — I255 Ischemic cardiomyopathy: Secondary | ICD-10-CM | POA: Diagnosis not present

## 2017-09-06 DIAGNOSIS — L02222 Furuncle of back [any part, except buttock]: Secondary | ICD-10-CM | POA: Diagnosis not present

## 2017-09-06 DIAGNOSIS — E1122 Type 2 diabetes mellitus with diabetic chronic kidney disease: Secondary | ICD-10-CM | POA: Diagnosis not present

## 2017-09-06 DIAGNOSIS — Z89431 Acquired absence of right foot: Secondary | ICD-10-CM | POA: Diagnosis not present

## 2017-09-06 DIAGNOSIS — I69351 Hemiplegia and hemiparesis following cerebral infarction affecting right dominant side: Secondary | ICD-10-CM | POA: Diagnosis not present

## 2017-09-06 DIAGNOSIS — I5042 Chronic combined systolic (congestive) and diastolic (congestive) heart failure: Secondary | ICD-10-CM | POA: Diagnosis not present

## 2017-09-06 DIAGNOSIS — E1151 Type 2 diabetes mellitus with diabetic peripheral angiopathy without gangrene: Secondary | ICD-10-CM | POA: Diagnosis not present

## 2017-09-06 DIAGNOSIS — I482 Chronic atrial fibrillation: Secondary | ICD-10-CM | POA: Diagnosis not present

## 2017-09-06 DIAGNOSIS — Z89432 Acquired absence of left foot: Secondary | ICD-10-CM | POA: Diagnosis not present

## 2017-09-08 DIAGNOSIS — L02222 Furuncle of back [any part, except buttock]: Secondary | ICD-10-CM | POA: Diagnosis not present

## 2017-09-08 DIAGNOSIS — E1122 Type 2 diabetes mellitus with diabetic chronic kidney disease: Secondary | ICD-10-CM | POA: Diagnosis not present

## 2017-09-08 DIAGNOSIS — I255 Ischemic cardiomyopathy: Secondary | ICD-10-CM | POA: Diagnosis not present

## 2017-09-08 DIAGNOSIS — I69351 Hemiplegia and hemiparesis following cerebral infarction affecting right dominant side: Secondary | ICD-10-CM | POA: Diagnosis not present

## 2017-09-08 DIAGNOSIS — Z89431 Acquired absence of right foot: Secondary | ICD-10-CM | POA: Diagnosis not present

## 2017-09-08 DIAGNOSIS — T8789 Other complications of amputation stump: Secondary | ICD-10-CM | POA: Diagnosis not present

## 2017-09-08 DIAGNOSIS — Z89432 Acquired absence of left foot: Secondary | ICD-10-CM | POA: Diagnosis not present

## 2017-09-08 DIAGNOSIS — I130 Hypertensive heart and chronic kidney disease with heart failure and stage 1 through stage 4 chronic kidney disease, or unspecified chronic kidney disease: Secondary | ICD-10-CM | POA: Diagnosis not present

## 2017-09-08 DIAGNOSIS — N182 Chronic kidney disease, stage 2 (mild): Secondary | ICD-10-CM | POA: Diagnosis not present

## 2017-09-08 DIAGNOSIS — I482 Chronic atrial fibrillation: Secondary | ICD-10-CM | POA: Diagnosis not present

## 2017-09-08 DIAGNOSIS — Z4781 Encounter for orthopedic aftercare following surgical amputation: Secondary | ICD-10-CM | POA: Diagnosis not present

## 2017-09-08 DIAGNOSIS — E1151 Type 2 diabetes mellitus with diabetic peripheral angiopathy without gangrene: Secondary | ICD-10-CM | POA: Diagnosis not present

## 2017-09-08 DIAGNOSIS — I5042 Chronic combined systolic (congestive) and diastolic (congestive) heart failure: Secondary | ICD-10-CM | POA: Diagnosis not present

## 2017-09-11 DIAGNOSIS — E1151 Type 2 diabetes mellitus with diabetic peripheral angiopathy without gangrene: Secondary | ICD-10-CM | POA: Diagnosis not present

## 2017-09-11 DIAGNOSIS — L02222 Furuncle of back [any part, except buttock]: Secondary | ICD-10-CM | POA: Diagnosis not present

## 2017-09-11 DIAGNOSIS — Z89431 Acquired absence of right foot: Secondary | ICD-10-CM | POA: Diagnosis not present

## 2017-09-11 DIAGNOSIS — I69351 Hemiplegia and hemiparesis following cerebral infarction affecting right dominant side: Secondary | ICD-10-CM | POA: Diagnosis not present

## 2017-09-11 DIAGNOSIS — E1122 Type 2 diabetes mellitus with diabetic chronic kidney disease: Secondary | ICD-10-CM | POA: Diagnosis not present

## 2017-09-11 DIAGNOSIS — T8789 Other complications of amputation stump: Secondary | ICD-10-CM | POA: Diagnosis not present

## 2017-09-11 DIAGNOSIS — I130 Hypertensive heart and chronic kidney disease with heart failure and stage 1 through stage 4 chronic kidney disease, or unspecified chronic kidney disease: Secondary | ICD-10-CM | POA: Diagnosis not present

## 2017-09-11 DIAGNOSIS — I5042 Chronic combined systolic (congestive) and diastolic (congestive) heart failure: Secondary | ICD-10-CM | POA: Diagnosis not present

## 2017-09-11 DIAGNOSIS — I255 Ischemic cardiomyopathy: Secondary | ICD-10-CM | POA: Diagnosis not present

## 2017-09-11 DIAGNOSIS — N182 Chronic kidney disease, stage 2 (mild): Secondary | ICD-10-CM | POA: Diagnosis not present

## 2017-09-11 DIAGNOSIS — Z89432 Acquired absence of left foot: Secondary | ICD-10-CM | POA: Diagnosis not present

## 2017-09-11 DIAGNOSIS — I482 Chronic atrial fibrillation: Secondary | ICD-10-CM | POA: Diagnosis not present

## 2017-09-11 DIAGNOSIS — Z4781 Encounter for orthopedic aftercare following surgical amputation: Secondary | ICD-10-CM | POA: Diagnosis not present

## 2017-09-12 DIAGNOSIS — Z89431 Acquired absence of right foot: Secondary | ICD-10-CM | POA: Diagnosis not present

## 2017-09-12 DIAGNOSIS — L02222 Furuncle of back [any part, except buttock]: Secondary | ICD-10-CM | POA: Diagnosis not present

## 2017-09-12 DIAGNOSIS — Z89432 Acquired absence of left foot: Secondary | ICD-10-CM | POA: Diagnosis not present

## 2017-09-12 DIAGNOSIS — E1122 Type 2 diabetes mellitus with diabetic chronic kidney disease: Secondary | ICD-10-CM | POA: Diagnosis not present

## 2017-09-13 DIAGNOSIS — I130 Hypertensive heart and chronic kidney disease with heart failure and stage 1 through stage 4 chronic kidney disease, or unspecified chronic kidney disease: Secondary | ICD-10-CM | POA: Diagnosis not present

## 2017-09-13 DIAGNOSIS — T8789 Other complications of amputation stump: Secondary | ICD-10-CM | POA: Diagnosis not present

## 2017-09-13 DIAGNOSIS — Z89432 Acquired absence of left foot: Secondary | ICD-10-CM | POA: Diagnosis not present

## 2017-09-13 DIAGNOSIS — E1122 Type 2 diabetes mellitus with diabetic chronic kidney disease: Secondary | ICD-10-CM | POA: Diagnosis not present

## 2017-09-13 DIAGNOSIS — I255 Ischemic cardiomyopathy: Secondary | ICD-10-CM | POA: Diagnosis not present

## 2017-09-13 DIAGNOSIS — N182 Chronic kidney disease, stage 2 (mild): Secondary | ICD-10-CM | POA: Diagnosis not present

## 2017-09-13 DIAGNOSIS — I5042 Chronic combined systolic (congestive) and diastolic (congestive) heart failure: Secondary | ICD-10-CM | POA: Diagnosis not present

## 2017-09-13 DIAGNOSIS — Z89431 Acquired absence of right foot: Secondary | ICD-10-CM | POA: Diagnosis not present

## 2017-09-13 DIAGNOSIS — L02222 Furuncle of back [any part, except buttock]: Secondary | ICD-10-CM | POA: Diagnosis not present

## 2017-09-13 DIAGNOSIS — I482 Chronic atrial fibrillation: Secondary | ICD-10-CM | POA: Diagnosis not present

## 2017-09-13 DIAGNOSIS — I69351 Hemiplegia and hemiparesis following cerebral infarction affecting right dominant side: Secondary | ICD-10-CM | POA: Diagnosis not present

## 2017-09-13 DIAGNOSIS — E1151 Type 2 diabetes mellitus with diabetic peripheral angiopathy without gangrene: Secondary | ICD-10-CM | POA: Diagnosis not present

## 2017-09-13 DIAGNOSIS — Z4781 Encounter for orthopedic aftercare following surgical amputation: Secondary | ICD-10-CM | POA: Diagnosis not present

## 2017-09-14 DIAGNOSIS — R2689 Other abnormalities of gait and mobility: Secondary | ICD-10-CM | POA: Diagnosis not present

## 2018-10-10 IMAGING — US IR US GUIDE VASC ACCESS LEFT
1 series · 2 of 2 positions shown · non-contrast
Comparison: none

INDICATION: 70-year-old male with left great toe osteomyelitis and chronic
occlusion of the anterior and posterior tibial arteries. He recently
underwent atherectomy and angioplasty of multifocal stenoses in the
superficial femoral and popliteal arteries and presents today for
attempted endovascular recanalization of his occluded anterior and
posterior tibial arteries.

[Series 1: ir (id) (id)/(id)/(id) · 2 of 2 slices shown]
[im 1/2]
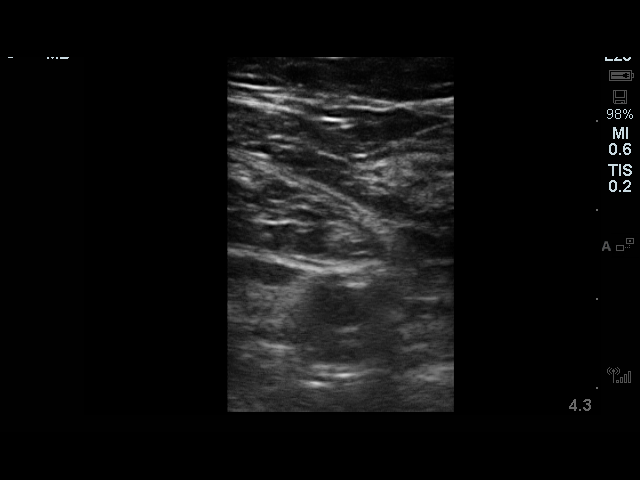
[im 2/2]
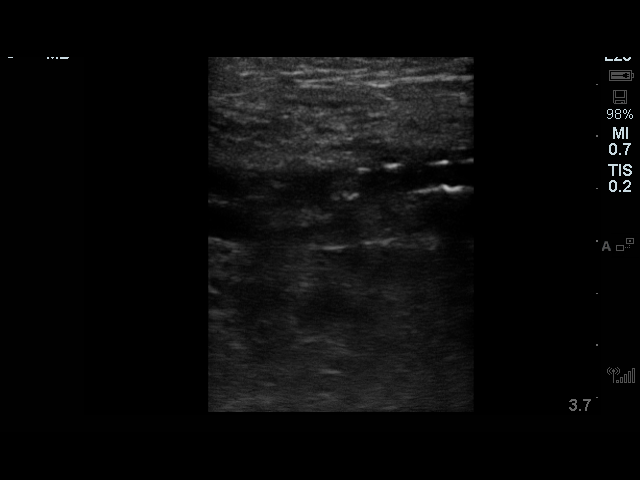

[2 of 2 positions shown; findings below may reference images not displayed]

EXAM:
LEFT EXTREMITY ARTERIOGRAPHY; IR ULTRASOUND GUIDANCE VASC ACCESS
LEFT

MEDICATIONS:
200 mcg nitroglycerin and 30000 units of heparin were administered
during the course of the procedure

ANESTHESIA/SEDATION:
Moderate (conscious) sedation was employed during this procedure. A
total of Versed 9 mg and Fentanyl 450 mcg was administered
intravenously.

Moderate Sedation Time: 181 minutes. The patient's level of
consciousness and vital signs were monitored continuously by
radiology nursing throughout the procedure under my direct
supervision.

CONTRAST:  65mL VISIPAQUE IODIXANOL 320 MG/ML IV SOLN

FLUOROSCOPY TIME:  Fluoroscopy Time: 62 minutes 54 seconds (94 mGy).

COMPLICATIONS:
None immediate.



The left common femoral artery was interrogated with ultrasound and
found to be widely patent. An image was obtained and stored for the
medical record. Local anesthesia was attained by infiltration with
1% lidocaine. A small dermatotomy was made. Under real-time
sonographic guidance, the vessel was punctured in antegrade fashion
with a 21 gauge micropuncture needle. Using standard technique, the
initial micro needle was exchanged over a 0.018 micro wire for a
transitional 4 French micro sheath. The micro sheath was then
exchanged over a 0.035 wire for a 5 French vascular sheath.

Left lower extremity arteriography was then performed demonstrating
patency of the previously treated superficial femoral artery. There
is persistent occlusion of the anterior tibial artery beginning just
beyond the origin. There is a flush occlusion of the posterior
tibial artery. Runoff is via a dominant peroneal artery.

An angled 5 French catheter was advanced into the popliteal artery
over a Bentson wire. A 0.014 angled CXI catheter was advanced
coaxially through the 5 French catheter. A whisper wire was used to
select the origin of the anterior tibial artery. The ALVIM VICENTE PORTENART catheter
was advanced into the anterior tibial artery. Numerous attempts with
various wires were made to cross the occluded vessel. This ALVIM VICENTE PORTENART
catheter could be advanced approximately into the mid calf but no
further. Attention was then turned to the posterior tibial artery.

Given the flush occlusion, the origin of the posterior tibial artery
cannot be identified angiographically. Therefore, the decision was
made to come retrograde from a pedal access.

The left posterior tibial artery was interrogated with ultrasound
and found to be patent just above the medial malleolus. An image was
obtained and stored for the medical record. Local anesthesia was
attained by infiltration with 1% lidocaine. A small dermatotomy was
made. Under real-time sonographic guidance, the vessel was punctured
with a 21 gauge micropuncture needle. Using standard technique, the
initial micro needle was exchanged over a 0.018 micro wire for a
transitional 4 French micro sheath. Rosalina Haygood check flow valve was
placed over the transitional 4 French micro sheath. An 0.018 CXI
catheter was advanced through the micro sheath over a V18 wire.
Despite extensive efforts with multiple wire and catheter
combinations, the chronically occluded posterior tibial artery could
not be completely crossed. After 3 hours of procedure time and over
60 minutes of fluoroscopy, the decision was made to cease further
efforts.

After confirming appropriate ACT, the 4 French micro sheath was
removed from the posterior tibial artery and hemostasis was attained
with manual pressure. The 5 French sheath was then removed from the
common femoral artery and a Cordis Exoseal extra arterial vascular
plug was applied followed by manual pressure. The patient tolerated
the procedure well.
IMPRESSION: 1. The previously treated superficial femoral and popliteal artery
segments remain patent without evidence of significant early re-
stenosis.
2. Persistent chronic occlusion of the anterior and posterior tibial
arteries.
3. Unsuccessful attempted recanalization of the occluded below the
knee arteries.

## 2018-12-20 ENCOUNTER — Other Ambulatory Visit: Payer: Self-pay

## 2018-12-26 ENCOUNTER — Other Ambulatory Visit: Payer: Self-pay | Admitting: Pharmacist

## 2019-03-13 ENCOUNTER — Telehealth: Payer: Self-pay

## 2019-03-13 NOTE — Telephone Encounter (Signed)
Spoke to pt to schedule appt. Pt has not been seen since 2018. Pt stated he is currently in a rehab facility s/p leg amputation and also does not live near [place] anymore. Told pt to call us if he needs anything. Pt verbalized thanks.

## 2019-11-14 DEATH — deceased
